# Patient Record
Sex: Female | Born: 1945 | Race: White | Hispanic: No | Smoking: Never smoker
Health system: Southern US, Community
[De-identification: ages and names within clinical notes are randomized; demographics above are authoritative.]

## PROBLEM LIST (undated history)

## (undated) DIAGNOSIS — Z8616 Personal history of COVID-19: Secondary | ICD-10-CM

## (undated) DIAGNOSIS — I471 Supraventricular tachycardia, unspecified: Secondary | ICD-10-CM

## (undated) DIAGNOSIS — R51 Headache: Secondary | ICD-10-CM

## (undated) DIAGNOSIS — I8390 Asymptomatic varicose veins of unspecified lower extremity: Secondary | ICD-10-CM

## (undated) DIAGNOSIS — M797 Fibromyalgia: Secondary | ICD-10-CM

## (undated) DIAGNOSIS — E039 Hypothyroidism, unspecified: Secondary | ICD-10-CM

## (undated) DIAGNOSIS — F331 Major depressive disorder, recurrent, moderate: Secondary | ICD-10-CM

## (undated) DIAGNOSIS — J189 Pneumonia, unspecified organism: Secondary | ICD-10-CM

## (undated) DIAGNOSIS — M25561 Pain in right knee: Secondary | ICD-10-CM

## (undated) DIAGNOSIS — E538 Deficiency of other specified B group vitamins: Secondary | ICD-10-CM

## (undated) DIAGNOSIS — N390 Urinary tract infection, site not specified: Secondary | ICD-10-CM

## (undated) DIAGNOSIS — I499 Cardiac arrhythmia, unspecified: Secondary | ICD-10-CM

## (undated) DIAGNOSIS — M1990 Unspecified osteoarthritis, unspecified site: Secondary | ICD-10-CM

## (undated) DIAGNOSIS — J479 Bronchiectasis, uncomplicated: Secondary | ICD-10-CM

## (undated) DIAGNOSIS — F1021 Alcohol dependence, in remission: Secondary | ICD-10-CM

## (undated) DIAGNOSIS — R011 Cardiac murmur, unspecified: Secondary | ICD-10-CM

## (undated) DIAGNOSIS — D649 Anemia, unspecified: Secondary | ICD-10-CM

## (undated) DIAGNOSIS — I10 Essential (primary) hypertension: Secondary | ICD-10-CM

## (undated) DIAGNOSIS — M25562 Pain in left knee: Secondary | ICD-10-CM

## (undated) DIAGNOSIS — I2510 Atherosclerotic heart disease of native coronary artery without angina pectoris: Secondary | ICD-10-CM

## (undated) DIAGNOSIS — R519 Headache, unspecified: Secondary | ICD-10-CM

## (undated) DIAGNOSIS — Z87440 Personal history of urinary (tract) infections: Secondary | ICD-10-CM

## (undated) DIAGNOSIS — F5101 Primary insomnia: Secondary | ICD-10-CM

## (undated) DIAGNOSIS — E785 Hyperlipidemia, unspecified: Secondary | ICD-10-CM

## (undated) DIAGNOSIS — F419 Anxiety disorder, unspecified: Secondary | ICD-10-CM

## (undated) DIAGNOSIS — Z9989 Dependence on other enabling machines and devices: Secondary | ICD-10-CM

## (undated) DIAGNOSIS — K5904 Chronic idiopathic constipation: Secondary | ICD-10-CM

## (undated) HISTORY — DX: Urinary tract infection, site not specified: N39.0

## (undated) HISTORY — DX: Pain in right knee: M25.561

## (undated) HISTORY — DX: Hyperlipidemia, unspecified: E78.5

## (undated) HISTORY — DX: Pneumonia, unspecified organism: J18.9

## (undated) HISTORY — DX: Hypothyroidism, unspecified: E03.9

## (undated) HISTORY — DX: Cardiac arrhythmia, unspecified: I49.9

## (undated) HISTORY — DX: Chronic idiopathic constipation: K59.04

## (undated) HISTORY — DX: Pain in right knee: M25.562

## (undated) HISTORY — DX: Alcohol dependence, in remission: F10.21

## (undated) HISTORY — DX: Essential (primary) hypertension: I10

## (undated) HISTORY — PX: BREAST BIOPSY: SHX20

## (undated) HISTORY — DX: Unspecified osteoarthritis, unspecified site: M19.90

## (undated) HISTORY — DX: Personal history of COVID-19: Z86.16

## (undated) HISTORY — PX: AUGMENTATION MAMMAPLASTY: SUR837

## (undated) HISTORY — DX: Anemia, unspecified: D64.9

## (undated) HISTORY — PX: BARIATRIC SURGERY: SHX1103

## (undated) HISTORY — PX: EYE SURGERY: SHX253

## (undated) HISTORY — PX: KNEE ARTHROSCOPY: SUR90

## (undated) HISTORY — DX: Supraventricular tachycardia: I47.1

## (undated) HISTORY — DX: Primary insomnia: F51.01

## (undated) HISTORY — DX: Asymptomatic varicose veins of unspecified lower extremity: I83.90

## (undated) HISTORY — PX: ANGIOPLASTY: SHX39

## (undated) HISTORY — DX: Bronchiectasis, uncomplicated: J47.9

## (undated) HISTORY — DX: Major depressive disorder, recurrent, moderate: F33.1

## (undated) HISTORY — PX: BACK SURGERY: SHX140

## (undated) HISTORY — DX: Deficiency of other specified B group vitamins: E53.8

## (undated) HISTORY — DX: Supraventricular tachycardia, unspecified: I47.10

## (undated) HISTORY — PX: APPENDECTOMY: SHX54

---

## 1968-06-20 HISTORY — PX: TUBAL LIGATION: SHX77

## 1979-06-21 HISTORY — PX: OTHER SURGICAL HISTORY: SHX169

## 1989-06-20 HISTORY — PX: ABDOMINAL HYSTERECTOMY: SHX81

## 1997-11-06 ENCOUNTER — Ambulatory Visit (HOSPITAL_COMMUNITY): Admission: RE | Admit: 1997-11-06 | Discharge: 1997-11-06 | Payer: Self-pay | Admitting: *Deleted

## 1997-11-11 ENCOUNTER — Ambulatory Visit (HOSPITAL_COMMUNITY): Admission: RE | Admit: 1997-11-11 | Discharge: 1997-11-11 | Payer: Self-pay | Admitting: *Deleted

## 1999-07-26 ENCOUNTER — Other Ambulatory Visit: Admission: RE | Admit: 1999-07-26 | Discharge: 1999-07-26 | Payer: Self-pay | Admitting: Gynecology

## 1999-08-17 ENCOUNTER — Ambulatory Visit (HOSPITAL_COMMUNITY): Admission: RE | Admit: 1999-08-17 | Discharge: 1999-08-18 | Payer: Self-pay | Admitting: Cardiology

## 2003-05-21 ENCOUNTER — Ambulatory Visit (HOSPITAL_COMMUNITY): Admission: RE | Admit: 2003-05-21 | Discharge: 2003-05-21 | Payer: Self-pay | Admitting: *Deleted

## 2004-02-04 ENCOUNTER — Ambulatory Visit (HOSPITAL_COMMUNITY): Admission: RE | Admit: 2004-02-04 | Discharge: 2004-02-04 | Payer: Self-pay | Admitting: *Deleted

## 2004-02-04 ENCOUNTER — Encounter: Admission: RE | Admit: 2004-02-04 | Discharge: 2004-02-04 | Payer: Self-pay | Admitting: *Deleted

## 2004-02-26 ENCOUNTER — Encounter: Admission: RE | Admit: 2004-02-26 | Discharge: 2004-05-26 | Payer: Self-pay | Admitting: *Deleted

## 2004-03-22 ENCOUNTER — Observation Stay (HOSPITAL_COMMUNITY): Admission: RE | Admit: 2004-03-22 | Discharge: 2004-03-23 | Payer: Self-pay | Admitting: *Deleted

## 2004-06-20 HISTORY — PX: BREAST ENHANCEMENT SURGERY: SHX7

## 2007-05-03 ENCOUNTER — Encounter: Admission: RE | Admit: 2007-05-03 | Discharge: 2007-05-03 | Payer: Self-pay | Admitting: Gynecology

## 2007-10-18 ENCOUNTER — Ambulatory Visit (HOSPITAL_COMMUNITY): Admission: RE | Admit: 2007-10-18 | Discharge: 2007-10-19 | Payer: Self-pay | Admitting: Neurosurgery

## 2007-10-19 HISTORY — PX: OTHER SURGICAL HISTORY: SHX169

## 2008-05-28 ENCOUNTER — Encounter: Admission: RE | Admit: 2008-05-28 | Discharge: 2008-05-28 | Payer: Self-pay | Admitting: Unknown Physician Specialty

## 2009-09-20 ENCOUNTER — Ambulatory Visit: Payer: Self-pay | Admitting: Cardiology

## 2009-09-20 ENCOUNTER — Inpatient Hospital Stay (HOSPITAL_COMMUNITY): Admission: EM | Admit: 2009-09-20 | Discharge: 2009-09-22 | Payer: Self-pay | Admitting: Emergency Medicine

## 2010-04-20 ENCOUNTER — Encounter: Admission: RE | Admit: 2010-04-20 | Discharge: 2010-04-20 | Payer: Self-pay | Admitting: Obstetrics and Gynecology

## 2010-09-08 LAB — CARDIAC PANEL(CRET KIN+CKTOT+MB+TROPI)
CK, MB: 0.6 ng/mL (ref 0.3–4.0)
Relative Index: INVALID (ref 0.0–2.5)

## 2010-09-08 LAB — LIPID PANEL
Cholesterol: 213 mg/dL — ABNORMAL HIGH (ref 0–200)
HDL: 51 mg/dL (ref >39)
LDL Cholesterol: 146 mg/dL — ABNORMAL HIGH (ref 0–99)
Total CHOL/HDL Ratio: 4.2 RATIO
Triglycerides: 81 mg/dL (ref <150)
VLDL: 16 mg/dL (ref 0–40)

## 2010-09-08 LAB — COMPREHENSIVE METABOLIC PANEL
ALT: 26 U/L (ref 0–35)
AST: 32 U/L (ref 0–37)
Albumin: 5 g/dL (ref 3.5–5.2)
Alkaline Phosphatase: 85 U/L (ref 39–117)
BUN: 19 mg/dL (ref 6–23)
CO2: 36 mEq/L — ABNORMAL HIGH (ref 19–32)
Calcium: 10.5 mg/dL (ref 8.4–10.5)
Chloride: 92 mEq/L — ABNORMAL LOW (ref 96–112)
Creatinine, Ser: 1.11 mg/dL (ref 0.4–1.2)
GFR calc Af Amer: >60 mL/min (ref >60)
GFR calc non Af Amer: 50 mL/min — ABNORMAL LOW (ref >60)
Glucose, Bld: 128 mg/dL — ABNORMAL HIGH (ref 70–99)
Potassium: 2.5 mEq/L — CL (ref 3.5–5.1)
Sodium: 142 mEq/L (ref 135–145)
Total Bilirubin: 1 mg/dL (ref 0.3–1.2)
Total Protein: 8.8 g/dL — ABNORMAL HIGH (ref 6.0–8.3)

## 2010-09-08 LAB — BASIC METABOLIC PANEL
CO2: 31 mEq/L (ref 19–32)
CO2: 34 mEq/L — ABNORMAL HIGH (ref 19–32)
Calcium: 8.1 mg/dL — ABNORMAL LOW (ref 8.4–10.5)
Calcium: 8.3 mg/dL — ABNORMAL LOW (ref 8.4–10.5)
Calcium: 8.6 mg/dL (ref 8.4–10.5)
Chloride: 102 mEq/L (ref 96–112)
GFR calc Af Amer: >60 mL/min (ref >60)
GFR calc Af Amer: >60 mL/min (ref >60)
GFR calc Af Amer: >60 mL/min (ref >60)
GFR calc non Af Amer: >60 mL/min (ref >60)
GFR calc non Af Amer: >60 mL/min (ref >60)
Glucose, Bld: 119 mg/dL — ABNORMAL HIGH (ref 70–99)
Potassium: 3.6 mEq/L (ref 3.5–5.1)
Sodium: 139 mEq/L (ref 135–145)
Sodium: 142 mEq/L (ref 135–145)
Sodium: 142 mEq/L (ref 135–145)

## 2010-09-08 LAB — DIFFERENTIAL
Basophils Absolute: 0 10*3/uL (ref 0.0–0.1)
Basophils Relative: 0 % (ref 0–1)
Basophils Relative: 1 % (ref 0–1)
Eosinophils Absolute: 0 10*3/uL (ref 0.0–0.7)
Eosinophils Relative: 1 % (ref 0–5)
Eosinophils Relative: 1 % (ref 0–5)
Lymphocytes Relative: 28 % (ref 12–46)
Lymphocytes Relative: 33 % (ref 12–46)
Lymphs Abs: 1.8 10*3/uL (ref 0.7–4.0)
Lymphs Abs: 2 10*3/uL (ref 0.7–4.0)
Lymphs Abs: 2.3 10*3/uL (ref 0.7–4.0)
Monocytes Absolute: 0.4 10*3/uL (ref 0.1–1.0)
Monocytes Absolute: 0.6 10*3/uL (ref 0.1–1.0)
Monocytes Absolute: 0.7 10*3/uL (ref 0.1–1.0)
Monocytes Relative: 10 % (ref 3–12)
Monocytes Relative: 6 % (ref 3–12)
Monocytes Relative: 9 % (ref 3–12)
Neutro Abs: 2.7 10*3/uL (ref 1.7–7.7)
Neutro Abs: 4.1 10*3/uL (ref 1.7–7.7)
Neutro Abs: 4.2 10*3/uL (ref 1.7–7.7)
Neutrophils Relative %: 57 % (ref 43–77)

## 2010-09-08 LAB — CK TOTAL AND CKMB (NOT AT ARMC)
CK, MB: 0.6 ng/mL (ref 0.3–4.0)
Relative Index: INVALID (ref 0.0–2.5)
Total CK: 61 U/L (ref 7–177)

## 2010-09-08 LAB — CBC
HCT: 33.8 % — ABNORMAL LOW (ref 36.0–46.0)
HCT: 45.9 % (ref 36.0–46.0)
Hemoglobin: 11.4 g/dL — ABNORMAL LOW (ref 12.0–15.0)
Hemoglobin: 11.5 g/dL — ABNORMAL LOW (ref 12.0–15.0)
Hemoglobin: 15.4 g/dL — ABNORMAL HIGH (ref 12.0–15.0)
MCHC: 33.6 g/dL (ref 30.0–36.0)
MCHC: 33.8 g/dL (ref 30.0–36.0)
MCV: 92.9 fL (ref 78.0–100.0)
MCV: 93.1 fL (ref 78.0–100.0)
Platelets: 247 10*3/uL (ref 150–400)
RBC: 3.63 MIL/uL — ABNORMAL LOW (ref 3.87–5.11)
RBC: 3.67 MIL/uL — ABNORMAL LOW (ref 3.87–5.11)
RBC: 4.94 MIL/uL (ref 3.87–5.11)
RDW: 13.5 % (ref 11.5–15.5)
WBC: 6.4 10*3/uL (ref 4.0–10.5)
WBC: 7.1 10*3/uL (ref 4.0–10.5)

## 2010-09-08 LAB — POCT CARDIAC MARKERS
CKMB, poc: <1 ng/mL — ABNORMAL LOW (ref 1.0–8.0)
CKMB, poc: <1 ng/mL — ABNORMAL LOW (ref 1.0–8.0)
Myoglobin, poc: 75.9 ng/mL (ref 12–200)
Myoglobin, poc: 83.7 ng/mL (ref 12–200)
Troponin i, poc: <0.05 ng/mL (ref 0.00–0.09)
Troponin i, poc: <0.05 ng/mL (ref 0.00–0.09)

## 2010-09-08 LAB — TROPONIN I: Troponin I: 0.03 ng/mL (ref 0.00–0.06)

## 2010-09-08 LAB — TSH: TSH: 6.767 u[IU]/mL — ABNORMAL HIGH (ref 0.350–4.500)

## 2010-09-08 LAB — LIPASE, BLOOD: Lipase: 35 U/L (ref 11–59)

## 2010-09-08 LAB — MAGNESIUM: Magnesium: 2.1 mg/dL (ref 1.5–2.5)

## 2010-11-02 NOTE — Op Note (Signed)
NAMECAITILIN, Courtney [name] NO.:  [id number]   MEDICAL RECORD NO.:  [id number]          PATIENT TYPE:  OIB   LOCATION:  [id number]                         FACILITY:  [facility]   PHYSICIAN:  Ophelia Charter, M.D.DATE OF BIRTH:  15-Oct-1945   DATE OF PROCEDURE:  10/18/2007  DATE OF DISCHARGE:  10/19/2007                               OPERATIVE REPORT   BRIEF HISTORY:  The patient is a 65 year old white female who suffered  from back and right leg pain consistent with a right L4-L5  radiculopathy.  She failed medical management and was worked up with a  lumbar MRI which demonstrated herniated disk L4-5 on the right.  I  discussed the various treatment options with the patient including  surgery.  She has aware of the risks, benefits, and alternatives of  surgery, so I proceed with a right L4 hemilaminectomy to decompress the  right L4-L5 nerve roots.   PREOPERATIVE DIAGNOSES:  1. 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  2. Spinal stenosis at L4-5.  3. Spondylolisthesis.  4. Lumbar radiculopathy/myelopathy.  5. Lumbago.   POSTOPERATIVE DIAGNOSES:  1. 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  2. Spinal stenosis at L4-5.  3. Spondylolisthesis.  4. Lumbar radiculopathy/myelopathy.  5. Lumbago.   PROCEDURE:  Right L4 hemilaminectomy for right L4-5 diskectomy using  microdissection to decompress the right L4 and L5 nerve roots.   SURGEON:  Ophelia Charter, MD   ASSISTANT:  Otilio Connors, MD   ANESTHESIA:  General endotracheal.   ESTIMATED BLOOD LOSS:  50 mL.   SPECIMENS:  None.   DRAINS:  None.   COMPLICATIONS:  None.   DESCRIPTION OF PROCEDURE:  The patient was brought to the operating room  by the anesthesia team.  General endotracheal anesthesia was induced.  The patient was turned to the prone position on the Wilson frame.  Her  lumbosacral region was then prepared with Betadine scrub and Betadine  solution.  Sterile drapes were applied and then injected the area to be  incised with Marcaine with epinephrine solution.  We used a scalpel to  make a linear midline incision over the L4-5 interspace.  I selected  electrocautery to perform a right-sided periosteal dissection exposing  the right spinous process and lamina of L4 and L5.  I obtained  intraoperatively radiograph to confirm our location.   We then inserted a McCullough retractor for exposure and then brought  the operative microscope into the field, and under its magnification and  illumination, we completed the microdissection/decompression.  We used a  high-speed drill to perform a right L4 laminotomy.  I completed the  right L4 hemilaminectomy using sponge and then removed the right L3-4  and 4-5 ligament flavum using Kerrison punch.  We also used high-speed  drill to perform a limited laminotomy at the L3 on the right.  We then  performed foraminotomy about the right L4 and L5 nerve roots.  We then  used microdissection to free up the thecal sac and nerve root from  epidural tissue.  Dr. Arnoldo Morale then gently retracted the thecal sac  medially.  This gave [place]  exposure of both the right L4 and L5 nerve  roots.  We inspected the right L5 nerve root.  There was some minimal  foraminal stenosis because of spinal anesthesia, but there did not  appear to be significant neural depression at the neural foramen.  We  then inspected the right L4 nerve root.  There was a small free fragment  disk herniation which was compressing the ventral aspect of small to  moderate free fragments disk herniation which was compressing the  exiting L4 nerve root at its entrance to neural foramen.  We used  microdissection to free up this disk herniation and removed the multiple  fragments using the pituitary forceps.  We then palpated along the  ventral surface of the exiting L4 nerve root and freed up some more  small fragments of disk herniation from the neural foramen.  We removed  these using the micro pituitary  forceps.  We then inspected the L4-5  intervertebral disk.  There was no large holes in the annulus nor did  there appear to be any impending disk herniations.  We obtained  hemostasis using bipolar cautery.  We palpated along the ventral surface  of the thecal sac along the exit route of the right L4 and L5 nerve  roots and noted the neural structures were well decompressed.  We then  removed the retractor and then reapproximated the patient's  thoracolumbar fascia with interrupted #1 Vicryl suture.  The  subcutaneous tissue was closed and 2-0 Vicryl suture and the skin with  Steri-Strips and Benzoin.  The wound was then coated with bacitracin  ointment and sterile dressing was applied.  The drapes were removed and  the patient was subsequently returned to supine position and extubated  by the anesthesia team.  He was transported to the [facility] in stable condition.  All sponge, instrument, and needle counts  were correct at the end of this case.      Ophelia Charter, M.D.  Electronically Signed     JDJ/MEDQ  D:  10/18/2007  T:  10/19/2007  Job:  [id number]

## 2010-11-05 NOTE — Op Note (Signed)
[name], [name] NO.:  [id number]   MEDICAL RECORD NO.:  [id number]          PATIENT TYPE:  OBV   LOCATION:  0474                         FACILITY:  [facility]   PHYSICIAN:  Darrelyn Hillock, M.D.DATE OF BIRTH:  05-16-1946   DATE OF PROCEDURE:  03/22/2004  DATE OF DISCHARGE:                                 OPERATIVE REPORT   PREOPERATIVE DIAGNOSIS:  Morbid obesity.   POSTOPERATIVE DIAGNOSIS:  Morbid obesity.   PROCEDURE:  Laparoscopic adjustable gastric banding with a 10 cm INAMED lap  band.   SURGEON:  Janeece Agee, M.D.   ASSISTANT:  Adonis Housekeeper, M.D.   ANESTHESIA:  General.   DESCRIPTION OF PROCEDURE:  The patient was taken to the operating room and  placed in a supine position.  After adequate anesthesia was induced using  endotracheal tube, the abdomen was prepped and draped in a normal sterile  fashion.  Using the OptiView technique, an 11 mm trocar was placed in the  left upper quadrant under direct visualization.  Two additional elevens were  placed in the right upper quadrant, and a 12 mm was placed in the right  infraumbilical paramedian position.  An additional 5 mm trocar was placed in  the left anterior axillary line.  Pneumoperitoneum had been obtained, and  the liver was retracted with a Nathanson retractor placed under the xiphoid.  A blunt dissection was done at the angle of His to create the adequate  space.  The dam passer was then passed posterior to the esophagus and  brought out in the area of previous dissection.  The band had previously  been primed and was placed through the 12 mm port and into the abdomen.  It  was passed behind the stomach and closed over an esophageal tube.  It moved  freely, and three separate interrupted Ethibond sutures were placed,  approximating the fundus of the stomach to the proximal pouch.  I had  adequate coverage of the band far away from the buckle.  The tubing was then  brought out  through the 12 mm port site and cut and attached to the port.  The incision was then extended, and the port was secured to the anterior  abdominal fascia with interrupted 2-0 Prolene sutures.  The incision was  closed with a subcutaneous 3-0 Vicryl.  All skin incisions were closed after  trocars were removed with staples and injected with 0.5% Marcaine.  The  patient tolerated the procedure well and went to PACU in good condition.      KRH/MEDQ  D:  03/22/2004  T:  03/22/2004  Job:  [id number]

## 2010-11-05 NOTE — Cardiovascular Report (Signed)
[place]  Patient:    Courtney [name], Courtney [name]                       MRN: [id number] Proc. Date: 08/17/99 Adm. Date:  [id number] Attending:  Wadie Lessen CC:         Despina Hick, M.D.             Wende Neighbors, M.D.             Cardiac Catheterization Laboratory                        Cardiac Catheterization  INDICATIONS:  [name] is a pleasant is a pleasant 65 year old white female. She has a strong family history of coronary artery disease.  She also has hypercholesterolemia.  She has had some exertional chest discomfort.  At exercise treadmill testing she had no EKG changes or Cardiolite abnormalities, but she did have chest discomfort.  She was, therefore, referred for further evaluation by cardiac catheterization.  The risks and alternatives were discussed with the patient.  She was agreeable o proceed.  PROCEDURES PERFORMED:  Left heart catheterization, selective coronary angiography, selective left ventriculography, root aortography.  DESCRIPTION OF PROCEDURE:  The procedure was performed from the right femoral artery using 6-French catheters.  She tolerated the procedure without complications.  There was a slightly lower blood pressure measured in the left rm and we, therefore, did a root angiogram to identify the subclavian.  There were no complications.  RESULTS:  HEMODYNAMIC DATA:  The central aortic pressure was 126/67.  LV pressure 126/16.  There was no gradient on pullback across the aortic valve.  ANGIOGRAPHIC DATA:  VENTRICULOGRAPHY:  Ventriculography in the RAO projection revealed preserved global systolic function.  No segmental abnormalities or contraction were identified.  Ejection fraction was calculated at 66%.  There was not significant mitral regurgitation.  ROOT ANGIOGRAM:  The root angiogram revealed what appeared to be a widely patent brachiocephalic vessel.  There was no evidence of thoracic arch  dissection. The subclavian on the left appeared to have a fairly smooth takeoff, filling the vertebral and the subclavian artery.  CORONARY ANGIOGRAPHY: 1. The left main coronary artery was free of critical disease.  2. The left anterior descending artery coursed to the apex.  There was one major    diagonal branch.  No significant focal stenoses were noted.  3. The circumflex consisted of predominantly one major marginal branch that    bifurcated.  There was some modest plaquing at the ostium of the vessel.    When seen in multiple views, there was the suggestion of a lumen of just over    2 mm to 2.5 mm.  High-grade stenosis was not noted in any view.  CONCLUSIONS: 1. Preserved left ventricular function. 2. No high-grade coronary stenosis.  DISPOSITION:  I will discuss the case with Dr. Woody Seller and perhaps a 2-D echocardiogram and other studies would be warranted.  Of note, the patient did ave several episodes of supraventricular tachycardia. DD:  08/17/99 TD:  08/17/99 Job: [id number]

## 2010-11-13 ENCOUNTER — Emergency Department (HOSPITAL_COMMUNITY)
Admission: EM | Admit: 2010-11-13 | Discharge: 2010-11-14 | Disposition: A | Discharge status: Home / Self Care | Payer: 59 | Attending: Emergency Medicine | Admitting: Emergency Medicine

## 2010-11-13 DIAGNOSIS — F3289 Other specified depressive episodes: Secondary | ICD-10-CM | POA: Insufficient documentation

## 2010-11-13 DIAGNOSIS — N390 Urinary tract infection, site not specified: Secondary | ICD-10-CM | POA: Insufficient documentation

## 2010-11-13 DIAGNOSIS — F329 Major depressive disorder, single episode, unspecified: Secondary | ICD-10-CM | POA: Insufficient documentation

## 2010-11-13 DIAGNOSIS — I10 Essential (primary) hypertension: Secondary | ICD-10-CM | POA: Insufficient documentation

## 2010-11-13 DIAGNOSIS — F1010 Alcohol abuse, uncomplicated: Secondary | ICD-10-CM | POA: Insufficient documentation

## 2010-11-13 LAB — DIFFERENTIAL
Basophils Absolute: 0.1 10*3/uL (ref 0.0–0.1)
Basophils Relative: 1 % (ref 0–1)
Eosinophils Relative: 1 % (ref 0–5)
Lymphocytes Relative: 36 % (ref 12–46)
Monocytes Absolute: 0.4 10*3/uL (ref 0.1–1.0)
Neutro Abs: 3.4 10*3/uL (ref 1.7–7.7)

## 2010-11-13 LAB — URINALYSIS, ROUTINE W REFLEX MICROSCOPIC
Bilirubin Urine: NEGATIVE
Glucose, UA: NEGATIVE mg/dL
Ketones, ur: NEGATIVE mg/dL
Nitrite: NEGATIVE
Specific Gravity, Urine: 1.007 (ref 1.005–1.030)
pH: 6.5 (ref 5.0–8.0)

## 2010-11-13 LAB — CBC
HCT: 42.8 % (ref 36.0–46.0)
MCHC: 33.6 g/dL (ref 30.0–36.0)
RDW: 12.5 % (ref 11.5–15.5)
WBC: 6 10*3/uL (ref 4.0–10.5)

## 2010-11-13 LAB — RAPID URINE DRUG SCREEN, HOSP PERFORMED
Amphetamines: NOT DETECTED
Benzodiazepines: NOT DETECTED
Cocaine: NOT DETECTED
Opiates: NOT DETECTED
Tetrahydrocannabinol: NOT DETECTED

## 2010-11-13 LAB — COMPREHENSIVE METABOLIC PANEL
ALT: 29 U/L (ref 0–35)
AST: 32 U/L (ref 0–37)
Albumin: 4.3 g/dL (ref 3.5–5.2)
Alkaline Phosphatase: 102 U/L (ref 39–117)
Calcium: 8.9 mg/dL (ref 8.4–10.5)
GFR calc Af Amer: >60 mL/min (ref >60)
Potassium: 3.6 mEq/L (ref 3.5–5.1)
Sodium: 137 mEq/L (ref 135–145)
Total Protein: 7.4 g/dL (ref 6.0–8.3)

## 2010-11-13 LAB — URINE MICROSCOPIC-ADD ON

## 2010-11-13 LAB — ETHANOL: Alcohol, Ethyl (B): <11 mg/dL — ABNORMAL HIGH (ref 0–10)

## 2010-11-14 DIAGNOSIS — F1020 Alcohol dependence, uncomplicated: Secondary | ICD-10-CM

## 2010-11-15 LAB — URINE CULTURE: Culture  Setup Time: 201205262025

## 2010-11-20 NOTE — Consult Note (Signed)
[name], BOOKWALTER NO.:  [id number]  MEDICAL RECORD NO.:  [id number]           PATIENT TYPE:  E  LOCATION:  WLED                         FACILITY:  [facility]  PHYSICIAN:  Juanda Crumble, MDDATE OF BIRTH:  Jul 13, 1945  DATE OF CONSULTATION:  11/14/2010 DATE OF DISCHARGE:  11/14/2010                                CONSULTATION   HISTORY:  Ms. Courtney [name] is a 65 year old married Caucasian female who worked as a Marine scientist in the hospital, also has alcohol abuse versus dependence.  The patient was brought in by her 22 year old daughter who is concerned about her drinking for the last 2 years, and knocked herself out.  Her blood alcohol level in the hospital was increased at 214 mg/dL.  The patient received Ativan during the emergency services for shakes.  The patient also reported she has been depressed, and seeing Dr. Lin Landsman, who was practicing in [place], gave her medication for depression.  Her medication was Celexa 20 mg daily.  She also takes medication Lasix and metoprolol.  The patient reported that she was sober 12 years before she started drinking 2 years ago.  Reportedly, she started drinking after losing a family member.  The patient reported that she has been drinking binge 2 days heavy, 12 or 13 days.  She reported drinking a bottle of whiskey or vodka and up to 6 cans of beers in 24 hours.  The patient reported that she has a family history of alcohol dependence in her father and her 80 years old daughter.  Her 61 years old daughter was sober at this time, working in [place].  Her 34 years old daughter works in [employer] in an office setting.  The patient also has a stress of her husband being a away, working in [place] for the last month.  The patient has regrets about her drinking and wants to get help.  She is willing to [facility] the [facility] where she has been in contact with.  Her family is in support of sending her to the  fellowship hall.  The patient does not have any history of detox or rehab in the past.  The patient reportedly able to manage herself at home.  MEDICAL HISTORY:  Hypertension.  PAST PSYCHIATRIC HISTORY:  Not significant for the hospitalization but received outpatient psychiatric medication from primary care physician, Dr. Lin Landsman in [place]:  The patient appeared as per her stated age, well developed, nourished, groomed well.  She has fine mood with bright and full affect.  She has normal rate, rhythm, and volume of speech. Her thought process is linear and goal directed.  She has no alcoholics shakes or hallucinations.  It does not seem like she has any DTs.  She has no history of DTs, and she has fair insight and judgment but poor impulse control with drinking alcohol.  DIAGNOSES:  Axis I:  Alcohol dependence, recent binge, but no signs of alcohol withdrawal during this visit. Axis II:  Deferred. Axis III:  Hypertension. Axis IV:  Moderate psychosocial stressors husband being away, recent loss her family member.  Axis V:  45 to 50.  TREATMENT PLAN:  The patient was offered detox, the patient refuses. The patient is willing to get rehab from the fellowship hall whenever bed is available.  Family is willing to take her home and follow up with Rehab Services.  She will continue her medications and follow up with Dr. Lin Landsman at [place].     Juanda Crumble, MD     JRJ/MEDQ  D:  11/14/2010  T:  11/14/2010  Job:  [id number]  Electronically Signed by Ambrose Finland MD on 11/20/2010 08:04:18 AM

## 2011-03-03 ENCOUNTER — Encounter (INDEPENDENT_AMBULATORY_CARE_PROVIDER_SITE_OTHER): Payer: 59 | Admitting: Surgery

## 2011-03-15 LAB — BASIC METABOLIC PANEL
BUN: 15
Chloride: 100
GFR calc Af Amer: >60
GFR calc non Af Amer: >60
Potassium: 4.3
Sodium: 135

## 2011-03-15 LAB — CBC
HCT: 35.1 — ABNORMAL LOW
Platelets: 243
RBC: 3.85 — ABNORMAL LOW
WBC: 9.3

## 2011-03-31 ENCOUNTER — Encounter (INDEPENDENT_AMBULATORY_CARE_PROVIDER_SITE_OTHER): Payer: 59 | Admitting: Surgery

## 2013-07-08 DIAGNOSIS — J019 Acute sinusitis, unspecified: Secondary | ICD-10-CM | POA: Diagnosis not present

## 2013-07-08 DIAGNOSIS — R300 Dysuria: Secondary | ICD-10-CM | POA: Diagnosis not present

## 2013-07-26 DIAGNOSIS — M533 Sacrococcygeal disorders, not elsewhere classified: Secondary | ICD-10-CM | POA: Diagnosis not present

## 2013-07-26 DIAGNOSIS — M129 Arthropathy, unspecified: Secondary | ICD-10-CM | POA: Diagnosis not present

## 2013-07-26 DIAGNOSIS — M5137 Other intervertebral disc degeneration, lumbosacral region: Secondary | ICD-10-CM | POA: Diagnosis not present

## 2013-08-14 DIAGNOSIS — F411 Generalized anxiety disorder: Secondary | ICD-10-CM | POA: Diagnosis not present

## 2013-08-14 DIAGNOSIS — J019 Acute sinusitis, unspecified: Secondary | ICD-10-CM | POA: Diagnosis not present

## 2013-10-09 DIAGNOSIS — Z23 Encounter for immunization: Secondary | ICD-10-CM | POA: Diagnosis not present

## 2013-10-09 DIAGNOSIS — M545 Low back pain, unspecified: Secondary | ICD-10-CM | POA: Diagnosis not present

## 2013-10-09 DIAGNOSIS — M5380 Other specified dorsopathies, site unspecified: Secondary | ICD-10-CM | POA: Diagnosis not present

## 2013-10-23 DIAGNOSIS — M5137 Other intervertebral disc degeneration, lumbosacral region: Secondary | ICD-10-CM | POA: Diagnosis not present

## 2013-10-23 DIAGNOSIS — M533 Sacrococcygeal disorders, not elsewhere classified: Secondary | ICD-10-CM | POA: Diagnosis not present

## 2013-10-23 DIAGNOSIS — M129 Arthropathy, unspecified: Secondary | ICD-10-CM | POA: Diagnosis not present

## 2013-11-14 DIAGNOSIS — M533 Sacrococcygeal disorders, not elsewhere classified: Secondary | ICD-10-CM | POA: Diagnosis not present

## 2013-12-05 DIAGNOSIS — K5900 Constipation, unspecified: Secondary | ICD-10-CM | POA: Diagnosis not present

## 2013-12-05 DIAGNOSIS — I10 Essential (primary) hypertension: Secondary | ICD-10-CM | POA: Diagnosis not present

## 2013-12-05 DIAGNOSIS — G4700 Insomnia, unspecified: Secondary | ICD-10-CM | POA: Diagnosis not present

## 2013-12-13 DIAGNOSIS — I10 Essential (primary) hypertension: Secondary | ICD-10-CM | POA: Diagnosis not present

## 2013-12-13 DIAGNOSIS — G4700 Insomnia, unspecified: Secondary | ICD-10-CM | POA: Diagnosis not present

## 2013-12-13 DIAGNOSIS — F411 Generalized anxiety disorder: Secondary | ICD-10-CM | POA: Diagnosis not present

## 2013-12-23 DIAGNOSIS — E663 Overweight: Secondary | ICD-10-CM | POA: Diagnosis not present

## 2014-01-07 DIAGNOSIS — I10 Essential (primary) hypertension: Secondary | ICD-10-CM | POA: Diagnosis not present

## 2014-01-07 DIAGNOSIS — E785 Hyperlipidemia, unspecified: Secondary | ICD-10-CM | POA: Diagnosis not present

## 2014-01-07 DIAGNOSIS — R0609 Other forms of dyspnea: Secondary | ICD-10-CM | POA: Diagnosis not present

## 2014-01-07 DIAGNOSIS — R0789 Other chest pain: Secondary | ICD-10-CM | POA: Diagnosis not present

## 2014-01-07 DIAGNOSIS — R002 Palpitations: Secondary | ICD-10-CM | POA: Diagnosis not present

## 2014-01-07 DIAGNOSIS — Z8679 Personal history of other diseases of the circulatory system: Secondary | ICD-10-CM | POA: Diagnosis not present

## 2014-01-07 DIAGNOSIS — I491 Atrial premature depolarization: Secondary | ICD-10-CM | POA: Diagnosis not present

## 2014-01-08 DIAGNOSIS — N3000 Acute cystitis without hematuria: Secondary | ICD-10-CM | POA: Diagnosis not present

## 2014-01-15 DIAGNOSIS — M129 Arthropathy, unspecified: Secondary | ICD-10-CM | POA: Diagnosis not present

## 2014-01-15 DIAGNOSIS — M533 Sacrococcygeal disorders, not elsewhere classified: Secondary | ICD-10-CM | POA: Diagnosis not present

## 2014-01-15 DIAGNOSIS — M5137 Other intervertebral disc degeneration, lumbosacral region: Secondary | ICD-10-CM | POA: Diagnosis not present

## 2014-01-28 DIAGNOSIS — M549 Dorsalgia, unspecified: Secondary | ICD-10-CM | POA: Diagnosis not present

## 2014-01-28 DIAGNOSIS — Z6827 Body mass index (BMI) 27.0-27.9, adult: Secondary | ICD-10-CM | POA: Diagnosis not present

## 2014-02-05 DIAGNOSIS — R300 Dysuria: Secondary | ICD-10-CM | POA: Diagnosis not present

## 2014-02-05 DIAGNOSIS — R1011 Right upper quadrant pain: Secondary | ICD-10-CM | POA: Diagnosis not present

## 2014-02-05 DIAGNOSIS — N390 Urinary tract infection, site not specified: Secondary | ICD-10-CM | POA: Diagnosis not present

## 2014-02-10 DIAGNOSIS — E119 Type 2 diabetes mellitus without complications: Secondary | ICD-10-CM | POA: Diagnosis not present

## 2014-02-10 DIAGNOSIS — R300 Dysuria: Secondary | ICD-10-CM | POA: Diagnosis not present

## 2014-02-10 DIAGNOSIS — R81 Glycosuria: Secondary | ICD-10-CM | POA: Diagnosis not present

## 2014-02-10 DIAGNOSIS — J019 Acute sinusitis, unspecified: Secondary | ICD-10-CM | POA: Diagnosis not present

## 2014-03-25 DIAGNOSIS — Z23 Encounter for immunization: Secondary | ICD-10-CM | POA: Diagnosis not present

## 2014-04-25 DIAGNOSIS — M533 Sacrococcygeal disorders, not elsewhere classified: Secondary | ICD-10-CM | POA: Diagnosis not present

## 2014-04-25 DIAGNOSIS — M5137 Other intervertebral disc degeneration, lumbosacral region: Secondary | ICD-10-CM | POA: Diagnosis not present

## 2014-04-25 DIAGNOSIS — M129 Arthropathy, unspecified: Secondary | ICD-10-CM | POA: Diagnosis not present

## 2014-05-21 DIAGNOSIS — J209 Acute bronchitis, unspecified: Secondary | ICD-10-CM | POA: Diagnosis not present

## 2014-05-21 DIAGNOSIS — R011 Cardiac murmur, unspecified: Secondary | ICD-10-CM | POA: Diagnosis not present

## 2014-05-21 DIAGNOSIS — R0989 Other specified symptoms and signs involving the circulatory and respiratory systems: Secondary | ICD-10-CM | POA: Diagnosis not present

## 2014-05-21 DIAGNOSIS — R0602 Shortness of breath: Secondary | ICD-10-CM | POA: Diagnosis not present

## 2014-05-21 DIAGNOSIS — R918 Other nonspecific abnormal finding of lung field: Secondary | ICD-10-CM | POA: Diagnosis not present

## 2014-05-21 DIAGNOSIS — R05 Cough: Secondary | ICD-10-CM | POA: Diagnosis not present

## 2014-05-26 DIAGNOSIS — I10 Essential (primary) hypertension: Secondary | ICD-10-CM | POA: Diagnosis not present

## 2014-05-26 DIAGNOSIS — R7309 Other abnormal glucose: Secondary | ICD-10-CM | POA: Diagnosis not present

## 2014-05-26 DIAGNOSIS — R002 Palpitations: Secondary | ICD-10-CM | POA: Diagnosis not present

## 2014-05-26 DIAGNOSIS — J209 Acute bronchitis, unspecified: Secondary | ICD-10-CM | POA: Diagnosis not present

## 2014-06-24 DIAGNOSIS — R0789 Other chest pain: Secondary | ICD-10-CM | POA: Diagnosis not present

## 2014-06-24 DIAGNOSIS — R0602 Shortness of breath: Secondary | ICD-10-CM | POA: Diagnosis not present

## 2014-06-24 DIAGNOSIS — R079 Chest pain, unspecified: Secondary | ICD-10-CM | POA: Diagnosis not present

## 2014-06-24 DIAGNOSIS — R071 Chest pain on breathing: Secondary | ICD-10-CM | POA: Diagnosis not present

## 2014-06-24 DIAGNOSIS — I10 Essential (primary) hypertension: Secondary | ICD-10-CM | POA: Diagnosis not present

## 2014-06-24 DIAGNOSIS — R05 Cough: Secondary | ICD-10-CM | POA: Diagnosis not present

## 2014-06-24 DIAGNOSIS — K209 Esophagitis, unspecified: Secondary | ICD-10-CM | POA: Diagnosis not present

## 2014-06-24 DIAGNOSIS — R112 Nausea with vomiting, unspecified: Secondary | ICD-10-CM | POA: Diagnosis not present

## 2014-06-24 DIAGNOSIS — R042 Hemoptysis: Secondary | ICD-10-CM | POA: Diagnosis not present

## 2014-06-24 DIAGNOSIS — J189 Pneumonia, unspecified organism: Secondary | ICD-10-CM | POA: Diagnosis not present

## 2014-06-24 DIAGNOSIS — E8809 Other disorders of plasma-protein metabolism, not elsewhere classified: Secondary | ICD-10-CM | POA: Diagnosis not present

## 2014-06-24 DIAGNOSIS — R0781 Pleurodynia: Secondary | ICD-10-CM | POA: Diagnosis not present

## 2014-06-24 DIAGNOSIS — A419 Sepsis, unspecified organism: Secondary | ICD-10-CM | POA: Diagnosis not present

## 2014-06-24 DIAGNOSIS — E785 Hyperlipidemia, unspecified: Secondary | ICD-10-CM | POA: Diagnosis not present

## 2014-06-24 DIAGNOSIS — R0600 Dyspnea, unspecified: Secondary | ICD-10-CM | POA: Diagnosis not present

## 2014-06-24 DIAGNOSIS — J9601 Acute respiratory failure with hypoxia: Secondary | ICD-10-CM | POA: Diagnosis not present

## 2014-06-24 DIAGNOSIS — R6520 Severe sepsis without septic shock: Secondary | ICD-10-CM | POA: Diagnosis not present

## 2014-07-03 DIAGNOSIS — J159 Unspecified bacterial pneumonia: Secondary | ICD-10-CM | POA: Diagnosis not present

## 2014-07-03 DIAGNOSIS — K228 Other specified diseases of esophagus: Secondary | ICD-10-CM | POA: Diagnosis not present

## 2014-07-09 ENCOUNTER — Institutional Professional Consult (permissible substitution): Payer: Self-pay | Admitting: Internal Medicine

## 2014-07-16 ENCOUNTER — Institutional Professional Consult (permissible substitution): Payer: Self-pay | Admitting: Internal Medicine

## 2014-07-25 ENCOUNTER — Encounter: Payer: Self-pay | Admitting: Internal Medicine

## 2014-07-25 ENCOUNTER — Ambulatory Visit (INDEPENDENT_AMBULATORY_CARE_PROVIDER_SITE_OTHER)
Admission: RE | Admit: 2014-07-25 | Discharge: 2014-07-25 | Disposition: A | Discharge status: Home / Self Care | Payer: Medicare Other | Source: Ambulatory Visit | Attending: Internal Medicine | Admitting: Internal Medicine

## 2014-07-25 ENCOUNTER — Ambulatory Visit (INDEPENDENT_AMBULATORY_CARE_PROVIDER_SITE_OTHER): Payer: Medicare Other | Admitting: Internal Medicine

## 2014-07-25 VITALS — BP 114/76 | HR 78 | Temp 97.8°F | Ht 65.5 in | Wt 174.0 lb

## 2014-07-25 DIAGNOSIS — R0789 Other chest pain: Secondary | ICD-10-CM | POA: Diagnosis not present

## 2014-07-25 DIAGNOSIS — J189 Pneumonia, unspecified organism: Secondary | ICD-10-CM | POA: Diagnosis not present

## 2014-07-25 HISTORY — DX: Pneumonia, unspecified organism: J18.9

## 2014-07-25 NOTE — Progress Notes (Signed)
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 Hemoptysis > admitted [facility] with dx of pna/ d/c on prednisone > off by end Jan 2016 and followed by Dr Lin Landsman in [place] / referred by Dr Lysbeth Galas to pulmonary clinic 07/25/2014     07/25/2014 1st [facility] office visit/ Kayleah Appleyard   Chief Complaint  Patient presents with  . Advice Only    pt here for f/u pna. Pt c/o chest pain with deep breathing,,Pt has sl. sob and wheezing. Pt denies cough.   still some discomfort with deep breath, started back walking at mall x 15 min s stopping,slow to mod pace. Pain is in same place it was when admitted with dx of pna but much less severe, only about a 1-2 vs 8-10 on admit  Has saba but not using  No obvious day to day or daytime variabilty  or assoc productive  cough or   chest tightness,   overt sinus or hb symptoms. No unusual exp hx or h/o childhood pna/ asthma or knowledge of premature birth.  Sleeping ok without nocturnal  or early am exacerbation  of respiratory  c/o's or need for noct saba. Also denies any obvious fluctuation of symptoms with weather or environmental changes or other aggravating or alleviating factors except as outlined above   Current Medications, Allergies, Complete Past Medical History, Past Surgical History, Family History, and Social History were reviewed in [facility] record.  ROS  The following are not active complaints unless bolded sore throat, dysphagia, dental problems, itching, sneezing,  nasal congestion or excess/ purulent secretions, ear ache,   fever, chills, sweats, unintended wt loss, pleuritic or exertional cp, hemoptysis,  orthopnea pnd or leg swelling, presyncope, palpitations, heartburn, abdominal pain, anorexia, nausea, vomiting, diarrhea  or change in bowel or  urinary habits, change in stools or urine, dysuria,hematuria,  rash, arthralgias, visual complaints, headache, numbness weakness or ataxia or problems with walking or coordination,  change in mood/affect or memory.           Review of Systems     Objective:   Physical Exam  amb wf nad  Wt Readings from Last 3 Encounters:  07/25/14 174 lb (78.926 kg)    Vital signs reviewed   HEENT: nl dentition, turbinates, and orophanx. Nl external ear canals without cough reflex   NECK :  without JVD/Nodes/TM/ nl carotid upstrokes bilaterally   LUNGS: no acc muscle use, clear to A and P bilaterally without cough on insp or exp maneuvers   CV:  RRR  no s3 or murmur or increase in P2, no edema   ABD:  soft and nontender with nl excursion in the supine position. No bruits or organomegaly, bowel sounds nl  MS:  warm without deformities, calf tenderness, cyanosis or clubbing  SKIN: warm and dry without lesions    NEURO:  alert, approp, no deficits    CXR PA and Lateral:   07/25/2014 :     I personally reviewed images and agree with radiology impression as follows:    Significant improvement on the right with minimal residual inflammatory change or atelectasis. On the left, there has been improvement but there is persistent radiographic infiltrate      Assessment:

## 2014-07-25 NOTE — Progress Notes (Signed)
Quick Note:  Spoke with pt and notified of results per Dr. Wert. Pt verbalized understanding and denied any questions.  ______ 

## 2014-07-25 NOTE — Patient Instructions (Signed)
Please remember to go to the  x-ray department downstairs for your tests - we will call you with the results when they are available.     

## 2014-07-27 ENCOUNTER — Encounter: Payer: Self-pay | Admitting: Internal Medicine

## 2014-07-27 DIAGNOSIS — R0789 Other chest pain: Secondary | ICD-10-CM | POA: Insufficient documentation

## 2014-07-27 NOTE — Assessment & Plan Note (Addendum)
See CTa Chest 06/24/14 at [facility] No assoc pleural effusion and right sided residual changes on cxr are less imprressive than the Left so I suspect this is just mscp from coughing which has now resolved and needs no further w/u unless flares in absence of  cough.

## 2014-07-27 NOTE — Assessment & Plan Note (Addendum)
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

## 2014-07-29 DIAGNOSIS — I83813 Varicose veins of bilateral lower extremities with pain: Secondary | ICD-10-CM | POA: Diagnosis not present

## 2014-08-05 DIAGNOSIS — M129 Arthropathy, unspecified: Secondary | ICD-10-CM | POA: Diagnosis not present

## 2014-08-05 DIAGNOSIS — M5137 Other intervertebral disc degeneration, lumbosacral region: Secondary | ICD-10-CM | POA: Diagnosis not present

## 2014-08-05 DIAGNOSIS — M533 Sacrococcygeal disorders, not elsewhere classified: Secondary | ICD-10-CM | POA: Diagnosis not present

## 2014-08-05 DIAGNOSIS — Z681 Body mass index (BMI) 19 or less, adult: Secondary | ICD-10-CM | POA: Diagnosis not present

## 2014-08-07 DIAGNOSIS — I83813 Varicose veins of bilateral lower extremities with pain: Secondary | ICD-10-CM | POA: Diagnosis not present

## 2014-08-25 ENCOUNTER — Ambulatory Visit: Payer: Medicare Other | Admitting: Internal Medicine

## 2014-09-02 DIAGNOSIS — I10 Essential (primary) hypertension: Secondary | ICD-10-CM | POA: Diagnosis not present

## 2014-09-02 DIAGNOSIS — E785 Hyperlipidemia, unspecified: Secondary | ICD-10-CM | POA: Diagnosis not present

## 2014-10-17 DIAGNOSIS — M179 Osteoarthritis of knee, unspecified: Secondary | ICD-10-CM | POA: Diagnosis not present

## 2014-10-17 DIAGNOSIS — M25561 Pain in right knee: Secondary | ICD-10-CM | POA: Diagnosis not present

## 2014-10-17 DIAGNOSIS — M11261 Other chondrocalcinosis, right knee: Secondary | ICD-10-CM | POA: Diagnosis not present

## 2014-10-30 ENCOUNTER — Other Ambulatory Visit: Payer: Self-pay | Admitting: Orthopedic Surgery

## 2014-10-30 DIAGNOSIS — R609 Edema, unspecified: Secondary | ICD-10-CM

## 2014-10-30 DIAGNOSIS — R52 Pain, unspecified: Secondary | ICD-10-CM

## 2014-10-30 DIAGNOSIS — M1711 Unilateral primary osteoarthritis, right knee: Secondary | ICD-10-CM | POA: Diagnosis not present

## 2014-10-31 DIAGNOSIS — I83813 Varicose veins of bilateral lower extremities with pain: Secondary | ICD-10-CM | POA: Diagnosis not present

## 2014-11-01 ENCOUNTER — Ambulatory Visit
Admission: RE | Admit: 2014-11-01 | Discharge: 2014-11-01 | Disposition: A | Discharge status: Home / Self Care | Payer: Medicare Other | Source: Ambulatory Visit | Attending: Orthopedic Surgery | Admitting: Orthopedic Surgery

## 2014-11-01 DIAGNOSIS — S8391XA Sprain of unspecified site of right knee, initial encounter: Secondary | ICD-10-CM | POA: Diagnosis not present

## 2014-11-01 DIAGNOSIS — R52 Pain, unspecified: Secondary | ICD-10-CM

## 2014-11-01 DIAGNOSIS — M25561 Pain in right knee: Secondary | ICD-10-CM | POA: Diagnosis not present

## 2014-11-01 DIAGNOSIS — R609 Edema, unspecified: Secondary | ICD-10-CM

## 2014-11-03 DIAGNOSIS — M1711 Unilateral primary osteoarthritis, right knee: Secondary | ICD-10-CM | POA: Diagnosis not present

## 2014-11-03 DIAGNOSIS — M2341 Loose body in knee, right knee: Secondary | ICD-10-CM | POA: Diagnosis not present

## 2014-11-03 DIAGNOSIS — M5137 Other intervertebral disc degeneration, lumbosacral region: Secondary | ICD-10-CM | POA: Diagnosis not present

## 2014-11-03 DIAGNOSIS — M533 Sacrococcygeal disorders, not elsewhere classified: Secondary | ICD-10-CM | POA: Diagnosis not present

## 2014-11-03 DIAGNOSIS — Z5181 Encounter for therapeutic drug level monitoring: Secondary | ICD-10-CM | POA: Diagnosis not present

## 2014-11-03 DIAGNOSIS — M129 Arthropathy, unspecified: Secondary | ICD-10-CM | POA: Diagnosis not present

## 2014-11-05 DIAGNOSIS — M179 Osteoarthritis of knee, unspecified: Secondary | ICD-10-CM | POA: Diagnosis not present

## 2014-11-05 DIAGNOSIS — S83281A Other tear of lateral meniscus, current injury, right knee, initial encounter: Secondary | ICD-10-CM | POA: Diagnosis not present

## 2014-11-05 DIAGNOSIS — M11261 Other chondrocalcinosis, right knee: Secondary | ICD-10-CM | POA: Diagnosis not present

## 2014-11-05 DIAGNOSIS — Y929 Unspecified place or not applicable: Secondary | ICD-10-CM | POA: Diagnosis not present

## 2014-11-05 DIAGNOSIS — M23221 Derangement of posterior horn of medial meniscus due to old tear or injury, right knee: Secondary | ICD-10-CM | POA: Diagnosis not present

## 2014-11-05 DIAGNOSIS — M94261 Chondromalacia, right knee: Secondary | ICD-10-CM | POA: Diagnosis not present

## 2014-11-05 DIAGNOSIS — X58XXXA Exposure to other specified factors, initial encounter: Secondary | ICD-10-CM | POA: Diagnosis not present

## 2014-11-05 DIAGNOSIS — S83241A Other tear of medial meniscus, current injury, right knee, initial encounter: Secondary | ICD-10-CM | POA: Diagnosis not present

## 2014-11-12 DIAGNOSIS — M25561 Pain in right knee: Secondary | ICD-10-CM | POA: Diagnosis not present

## 2014-11-13 DIAGNOSIS — Z9889 Other specified postprocedural states: Secondary | ICD-10-CM | POA: Diagnosis not present

## 2014-11-13 DIAGNOSIS — M79604 Pain in right leg: Secondary | ICD-10-CM | POA: Diagnosis not present

## 2014-11-19 DIAGNOSIS — M25561 Pain in right knee: Secondary | ICD-10-CM | POA: Diagnosis not present

## 2014-11-20 DIAGNOSIS — M25561 Pain in right knee: Secondary | ICD-10-CM | POA: Diagnosis not present

## 2014-11-22 DIAGNOSIS — N3000 Acute cystitis without hematuria: Secondary | ICD-10-CM | POA: Diagnosis not present

## 2014-12-01 DIAGNOSIS — M160 Bilateral primary osteoarthritis of hip: Secondary | ICD-10-CM | POA: Diagnosis not present

## 2014-12-01 DIAGNOSIS — M5441 Lumbago with sciatica, right side: Secondary | ICD-10-CM | POA: Diagnosis not present

## 2014-12-09 DIAGNOSIS — M5136 Other intervertebral disc degeneration, lumbar region: Secondary | ICD-10-CM | POA: Diagnosis not present

## 2014-12-09 DIAGNOSIS — M5441 Lumbago with sciatica, right side: Secondary | ICD-10-CM | POA: Diagnosis not present

## 2014-12-09 DIAGNOSIS — M47816 Spondylosis without myelopathy or radiculopathy, lumbar region: Secondary | ICD-10-CM | POA: Diagnosis not present

## 2014-12-09 DIAGNOSIS — M5416 Radiculopathy, lumbar region: Secondary | ICD-10-CM | POA: Diagnosis not present

## 2014-12-09 DIAGNOSIS — M4806 Spinal stenosis, lumbar region: Secondary | ICD-10-CM | POA: Diagnosis not present

## 2015-01-13 DIAGNOSIS — E780 Pure hypercholesterolemia: Secondary | ICD-10-CM | POA: Diagnosis not present

## 2015-01-13 DIAGNOSIS — D509 Iron deficiency anemia, unspecified: Secondary | ICD-10-CM | POA: Diagnosis not present

## 2015-01-13 DIAGNOSIS — I10 Essential (primary) hypertension: Secondary | ICD-10-CM | POA: Diagnosis not present

## 2015-01-13 DIAGNOSIS — I8393 Asymptomatic varicose veins of bilateral lower extremities: Secondary | ICD-10-CM | POA: Diagnosis not present

## 2015-01-13 DIAGNOSIS — R5383 Other fatigue: Secondary | ICD-10-CM | POA: Diagnosis not present

## 2015-01-13 DIAGNOSIS — Z79899 Other long term (current) drug therapy: Secondary | ICD-10-CM | POA: Diagnosis not present

## 2015-02-02 DIAGNOSIS — M5137 Other intervertebral disc degeneration, lumbosacral region: Secondary | ICD-10-CM | POA: Diagnosis not present

## 2015-02-02 DIAGNOSIS — R300 Dysuria: Secondary | ICD-10-CM | POA: Diagnosis not present

## 2015-02-02 DIAGNOSIS — Z6826 Body mass index (BMI) 26.0-26.9, adult: Secondary | ICD-10-CM | POA: Diagnosis not present

## 2015-02-02 DIAGNOSIS — J029 Acute pharyngitis, unspecified: Secondary | ICD-10-CM | POA: Diagnosis not present

## 2015-02-02 DIAGNOSIS — N3090 Cystitis, unspecified without hematuria: Secondary | ICD-10-CM | POA: Diagnosis not present

## 2015-02-02 DIAGNOSIS — M4806 Spinal stenosis, lumbar region: Secondary | ICD-10-CM | POA: Diagnosis not present

## 2015-02-02 DIAGNOSIS — M129 Arthropathy, unspecified: Secondary | ICD-10-CM | POA: Diagnosis not present

## 2015-02-09 ENCOUNTER — Encounter: Payer: Self-pay | Admitting: Vascular Surgery

## 2015-02-10 ENCOUNTER — Encounter: Payer: Self-pay | Admitting: Vascular Surgery

## 2015-02-10 ENCOUNTER — Ambulatory Visit (INDEPENDENT_AMBULATORY_CARE_PROVIDER_SITE_OTHER): Payer: Medicare Other | Admitting: Vascular Surgery

## 2015-02-10 VITALS — BP 111/66 | HR 72 | Temp 97.9°F | Resp 18 | Ht 64.0 in | Wt 172.7 lb

## 2015-02-10 DIAGNOSIS — I83813 Varicose veins of bilateral lower extremities with pain: Secondary | ICD-10-CM | POA: Diagnosis not present

## 2015-02-10 NOTE — Progress Notes (Signed)
Patient name: Courtney [name] MRN: [id number] DOB: 10/23/1945 Sex: female   Referred by: Lin Landsman  Reason for referral:  Chief Complaint  Patient presents with  . New Evaluation    bilateral leg pain and swelling R>L,  have been wearing thigh high compression hose 20-30 mm HG > 3 months     . Varicose Veins    HISTORY OF PRESENT ILLNESS: The patient resents today for discussion of venous hypertension. She is a very pleasant active healthy 69 year old female who reports several issues regarding her lower extremities. 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 There is no pain specifically related to these. She also has diffuse aching of both lower extremities. This may be slightly more on her right leg to her left. She reports this occurs in her calves and her thighs. It is chronic and is slightly worse with prolonged standing. There is no association with actually walking and pain. She has no history of arterial insufficiency. Has no history of DVT. She has no changes of venous hypertension, specifically no hemosiderin deposits at the level of her ankle and no swelling. She did have a trial of graduated compression and notes no improvement with the use of these. She was seen by a vein Center in [place] and I do have the documentation from these and reviewed these. Also underwent formal venous duplex and I have this as well. She does have a history of degenerative disc disease and is to see a spine surgeon within the next 1-2 weeks for follow-up as well  Past Medical History  Diagnosis Date  . Varicose veins   . Hyperlipidemia   . Arthritis   . Anemia   . Hypertension   . Recovering alcoholic     Past Surgical History  Procedure Laterality Date  . Appendectomy    . Abdominal hysterectomy  1991  . Removal of cervical disc fragments  10-2007  . Lap band surgery  1981  . Breast enhancement surgery   2006    Social History   Social History  . Marital Status: Married    Spouse Name: N/A  . Number of Children: N/A  . Years of Education: N/A   Occupational History  . Not on file.   Social History Main Topics  . Smoking status: Never Smoker   . Smokeless tobacco: Never Used  . Alcohol Use: No  . Drug Use: No  . Sexual Activity: Not on file   Other Topics Concern  . Not on file   Social History Narrative    Family History  Problem Relation Age of Onset  . Heart disease Mother   . Heart disease Father   . Heart disease Brother     Allergies as of 02/10/2015  . (No Known Allergies)    Current Outpatient Prescriptions on File Prior to Visit  Medication Sig Dispense Refill  . HYDROcodone-acetaminophen (NORCO) 7.5-325 MG per tablet Take 1 tablet by mouth daily as needed.    . metoprolol tartrate (LOPRESSOR) 25 MG tablet Take 25 mg by mouth 2 (two) times daily.    . Multiple Vitamins-Minerals (CENTRUM SILVER ADULT 50+ PO) Take 1 tablet by mouth daily.    . pravastatin (PRAVACHOL) 20 MG tablet Take 20 mg by mouth daily.    [name] PROAIR HFA 108 (90 BASE) MCG/ACT inhaler Inhale 2 puffs into the lungs as needed.    . vitamin B-12 (CYANOCOBALAMIN)  1000 MCG tablet Place 1,000 mcg under the tongue daily.     No current facility-administered medications on file prior to visit.     REVIEW OF SYSTEMS:  Positives indicated with an "X"  CARDIOVASCULAR:  [ ]  chest pain   [ ]  chest pressure   [ ]  palpitations   [ ]  orthopnea   [ ]  dyspnea on exertion   [ ]  claudication   [ ]  rest pain   [ ]  DVT   [ ]  phlebitis PULMONARY:   [ ]  productive cough   [ ]  asthma   [ ]  wheezing NEUROLOGIC:   [x weakness  [ x] paresthesias  [ ]  aphasia  [ ]  amaurosis  [ ]  dizziness HEMATOLOGIC:   [ ]  bleeding problems   [ ]  clotting disorders MUSCULOSKELETAL:  [ ]  joint pain   [ ]  joint swelling GASTROINTESTINAL: [ ]   blood in stool  [ ]   hematemesis GENITOURINARY:  [ ]   dysuria  [ ]    hematuria PSYCHIATRIC:  [ ]  history of major depression INTEGUMENTARY:  [ ]  rashes  [ ]  ulcers CONSTITUTIONAL:  [ ]  fever   [ ]  chills  PHYSICAL EXAMINATION:  General: The patient is a well-nourished female, in no acute distress. Vital signs are BP 111/66 mmHg  Pulse 72  Temp(Src) 97.9 F (36.6 C) (Oral)  Resp 18  Ht 5\' 4"  (1.626 m)  Wt 172 lb 11.2 oz (78.336 kg)  BMI 29.63 kg/m2  SpO2 99% Pulmonary: There is a good air exchange bilaterally without wheezing or rales. Abdomen: Soft and non-tender with normal pitch bowel sounds. Musculoskeletal: There are no major deformities.  There is no significant extremity pain. Neurologic: No focal weakness or paresthesias are detected, Skin: There are no ulcer or rashes noted. Psychiatric: The patient has normal affect. Cardiovascular: There is a regular rate and rhythm without significant murmur appreciated. Carotid arteries without bruits bilaterally Pulse status: 2+ radial and 2+ posterior tibial pulses bilaterally She does have extensive scattered telangiectasia and a fan like distribution on her thighs bilaterally also some more scattered telangiectasia in her calves bilaterally. No lower extremity swelling and no changes of venous hypertension around her ankles. She does have some slightly raise reticular veins in her right posterior calf but no true varicosities.  Her venous reflux study from 07/28/2014. Distention did show reflux in her great and small saphenous vein bilaterally with no evidence of reflux in her deep venous system. No evidence of DVT  Impression and plan bilateral lower extremity discomfort with aching cramping sensation. I did do have a very long discussion with the patient.  I reimaged her saphenous vein with SonoSite ultrasound. There is mild dilatation in her great saphenous veins bilaterally. I am certainly not convinced that her symptoms are related to venous hypertension. I explained that there is really no study to  determine if this would be beneficial to her. I explained that I felt that her chance of improvement in my opinion would be less than 50%. I explained that there would be very minimal risk of proceeding with laser ablation but I'm certainly not convinced she would have benefit from this. I did explain that this was progressive over time this could be easily treated at a later date. She is comfortable with this discussion and wants to see a spine surgeon before proceeding with any venous intervention. I do feel that the telangiectasia separate issue. I feel this has any association with her saphenous vein hypertension as this  is classic pattern of typical telangiectasia. She does wish to consider sclerotherapy for cosmetic treatment of these. I discussed with her the proximal out-of-pocket expense associated with this and we have given her contact information forLiz Marketing executive to schedule sclerotherapy treatments. She was comfortable with this discussion will see [place] again on as-needed basis         Genelle Economou [facility] Office: [phone]                Problems with Activities of Daily Living Secondary to Leg Pain  1. Courtney [name] states all activities that require prolonged standing (cooking, cleaning, shopping) are very difficult for her due to leg pain.    2. Courtney [name] states that exercise (walking, hiking) is difficult for her due to leg pain.   3. Courtney [name] states that yard work is difficult for her due to leg pain.     Failure of  Conservative Therapy:  1. Worn 20-30 mm Hg thigh high compression hose >3 months with no relief of symptoms.  2. Frequently elevates legs-no relief of symptoms  3. Taken Ibuprofen 600 Mg TID with no relief of symptoms.

## 2015-02-12 DIAGNOSIS — M4806 Spinal stenosis, lumbar region: Secondary | ICD-10-CM | POA: Diagnosis not present

## 2015-02-12 DIAGNOSIS — M4316 Spondylolisthesis, lumbar region: Secondary | ICD-10-CM | POA: Diagnosis not present

## 2015-02-12 DIAGNOSIS — M5416 Radiculopathy, lumbar region: Secondary | ICD-10-CM | POA: Diagnosis not present

## 2015-02-12 DIAGNOSIS — Z6828 Body mass index (BMI) 28.0-28.9, adult: Secondary | ICD-10-CM | POA: Diagnosis not present

## 2015-02-16 ENCOUNTER — Encounter: Payer: Self-pay | Admitting: Family Medicine

## 2015-02-17 DIAGNOSIS — M9903 Segmental and somatic dysfunction of lumbar region: Secondary | ICD-10-CM | POA: Diagnosis not present

## 2015-02-17 DIAGNOSIS — M9902 Segmental and somatic dysfunction of thoracic region: Secondary | ICD-10-CM | POA: Diagnosis not present

## 2015-02-17 DIAGNOSIS — M5416 Radiculopathy, lumbar region: Secondary | ICD-10-CM | POA: Diagnosis not present

## 2015-02-17 DIAGNOSIS — M9905 Segmental and somatic dysfunction of pelvic region: Secondary | ICD-10-CM | POA: Diagnosis not present

## 2015-02-17 DIAGNOSIS — M5126 Other intervertebral disc displacement, lumbar region: Secondary | ICD-10-CM | POA: Diagnosis not present

## 2015-02-19 DIAGNOSIS — M9903 Segmental and somatic dysfunction of lumbar region: Secondary | ICD-10-CM | POA: Diagnosis not present

## 2015-02-19 DIAGNOSIS — M5416 Radiculopathy, lumbar region: Secondary | ICD-10-CM | POA: Diagnosis not present

## 2015-02-19 DIAGNOSIS — M5126 Other intervertebral disc displacement, lumbar region: Secondary | ICD-10-CM | POA: Diagnosis not present

## 2015-02-19 DIAGNOSIS — M9905 Segmental and somatic dysfunction of pelvic region: Secondary | ICD-10-CM | POA: Diagnosis not present

## 2015-02-19 DIAGNOSIS — M9902 Segmental and somatic dysfunction of thoracic region: Secondary | ICD-10-CM | POA: Diagnosis not present

## 2015-02-24 DIAGNOSIS — J189 Pneumonia, unspecified organism: Secondary | ICD-10-CM | POA: Diagnosis not present

## 2015-02-24 DIAGNOSIS — R062 Wheezing: Secondary | ICD-10-CM | POA: Diagnosis not present

## 2015-02-27 DIAGNOSIS — M9903 Segmental and somatic dysfunction of lumbar region: Secondary | ICD-10-CM | POA: Diagnosis not present

## 2015-02-27 DIAGNOSIS — M5126 Other intervertebral disc displacement, lumbar region: Secondary | ICD-10-CM | POA: Diagnosis not present

## 2015-02-27 DIAGNOSIS — M9905 Segmental and somatic dysfunction of pelvic region: Secondary | ICD-10-CM | POA: Diagnosis not present

## 2015-02-27 DIAGNOSIS — M5416 Radiculopathy, lumbar region: Secondary | ICD-10-CM | POA: Diagnosis not present

## 2015-02-27 DIAGNOSIS — M9902 Segmental and somatic dysfunction of thoracic region: Secondary | ICD-10-CM | POA: Diagnosis not present

## 2015-03-03 ENCOUNTER — Encounter: Payer: Self-pay | Admitting: *Deleted

## 2015-03-04 ENCOUNTER — Ambulatory Visit: Payer: Medicare Other | Admitting: *Deleted

## 2015-03-06 DIAGNOSIS — M1711 Unilateral primary osteoarthritis, right knee: Secondary | ICD-10-CM | POA: Diagnosis not present

## 2015-03-10 DIAGNOSIS — M9905 Segmental and somatic dysfunction of pelvic region: Secondary | ICD-10-CM | POA: Diagnosis not present

## 2015-03-10 DIAGNOSIS — M9903 Segmental and somatic dysfunction of lumbar region: Secondary | ICD-10-CM | POA: Diagnosis not present

## 2015-03-10 DIAGNOSIS — M5126 Other intervertebral disc displacement, lumbar region: Secondary | ICD-10-CM | POA: Diagnosis not present

## 2015-03-10 DIAGNOSIS — M9902 Segmental and somatic dysfunction of thoracic region: Secondary | ICD-10-CM | POA: Diagnosis not present

## 2015-03-10 DIAGNOSIS — M5416 Radiculopathy, lumbar region: Secondary | ICD-10-CM | POA: Diagnosis not present

## 2015-03-12 DIAGNOSIS — M5416 Radiculopathy, lumbar region: Secondary | ICD-10-CM | POA: Diagnosis not present

## 2015-03-12 DIAGNOSIS — M4316 Spondylolisthesis, lumbar region: Secondary | ICD-10-CM | POA: Diagnosis not present

## 2015-04-07 DIAGNOSIS — R109 Unspecified abdominal pain: Secondary | ICD-10-CM | POA: Diagnosis not present

## 2015-04-07 DIAGNOSIS — Z23 Encounter for immunization: Secondary | ICD-10-CM | POA: Diagnosis not present

## 2015-04-22 DIAGNOSIS — M519 Unspecified thoracic, thoracolumbar and lumbosacral intervertebral disc disorder: Secondary | ICD-10-CM | POA: Diagnosis not present

## 2015-05-08 DIAGNOSIS — R739 Hyperglycemia, unspecified: Secondary | ICD-10-CM | POA: Diagnosis not present

## 2015-05-08 DIAGNOSIS — M5417 Radiculopathy, lumbosacral region: Secondary | ICD-10-CM | POA: Diagnosis not present

## 2015-05-08 DIAGNOSIS — I10 Essential (primary) hypertension: Secondary | ICD-10-CM | POA: Diagnosis not present

## 2015-05-08 DIAGNOSIS — Z780 Asymptomatic menopausal state: Secondary | ICD-10-CM | POA: Diagnosis not present

## 2015-05-19 DIAGNOSIS — M4316 Spondylolisthesis, lumbar region: Secondary | ICD-10-CM | POA: Diagnosis not present

## 2015-05-19 DIAGNOSIS — Z6827 Body mass index (BMI) 27.0-27.9, adult: Secondary | ICD-10-CM | POA: Diagnosis not present

## 2015-05-19 DIAGNOSIS — M5416 Radiculopathy, lumbar region: Secondary | ICD-10-CM | POA: Diagnosis not present

## 2015-05-19 DIAGNOSIS — M4806 Spinal stenosis, lumbar region: Secondary | ICD-10-CM | POA: Diagnosis not present

## 2015-05-20 ENCOUNTER — Other Ambulatory Visit: Payer: Self-pay | Admitting: Neurosurgery

## 2015-05-22 DIAGNOSIS — M5137 Other intervertebral disc degeneration, lumbosacral region: Secondary | ICD-10-CM | POA: Diagnosis not present

## 2015-05-25 ENCOUNTER — Encounter: Payer: Self-pay | Admitting: Family Medicine

## 2015-06-16 ENCOUNTER — Inpatient Hospital Stay (HOSPITAL_COMMUNITY)
Admission: RE | Admit: 2015-06-16 | Discharge: 2015-06-16 | Disposition: A | Discharge status: Home / Self Care | Payer: Medicare Other | Source: Ambulatory Visit

## 2015-06-16 NOTE — Pre-Procedure Instructions (Signed)
    Courtney [name]  06/16/2015      [facility] - [place], [place] - [address] [place] [address] Phone: [phone] Fax: [phone]    Your procedure is scheduled on 06-24-2015   Wednesday    Report to [facility] Admitting at 6:30 A.M.   Call this number if you have problems the morning of surgery:  [phone]   Remember:  Do not eat food or drink liquids after midnight.   Take these medicines the morning of surgery with A SIP OF WATER pain medication if needed, inhaler if needed,metoprolol(Lopressor)               STOP ALL NSAIDS(ALEVE,NAPROXEN,IBUPROFEN,GOODY POWDERS),ASPIRIN AND ANY HERBAL SUPPLEMENTS   Do not wear jewelry, make-up or nail polish.  Do not wear lotions, powders, or perfumes.  You may NOT wear deodorant.  Do not shave 48 hours prior to surgery.   .  Do not bring valuables to the hospital.  [facility] is not responsible for any belongings or valuables.  Contacts, dentures or bridgework may not be worn into surgery.  Leave your suitcase in the car.  After surgery it may be brought to your room.  For patients admitted to the hospital, discharge time will be determined by your treatment team.  Patients discharged the day of surgery will not be allowed to drive home.   Special instructions:  See attached sheet for instructions on CHG showers.  Please read over the following fact sheets that you were given. Pain Booklet, Blood Transfusion Information and Surgical Site Infection Prevention

## 2015-07-01 DIAGNOSIS — M4806 Spinal stenosis, lumbar region: Secondary | ICD-10-CM | POA: Diagnosis not present

## 2015-07-01 DIAGNOSIS — M129 Arthropathy, unspecified: Secondary | ICD-10-CM | POA: Diagnosis not present

## 2015-07-01 DIAGNOSIS — Z6826 Body mass index (BMI) 26.0-26.9, adult: Secondary | ICD-10-CM | POA: Diagnosis not present

## 2015-07-01 DIAGNOSIS — M5137 Other intervertebral disc degeneration, lumbosacral region: Secondary | ICD-10-CM | POA: Diagnosis not present

## 2015-07-10 DIAGNOSIS — R300 Dysuria: Secondary | ICD-10-CM | POA: Diagnosis not present

## 2015-07-22 ENCOUNTER — Encounter (HOSPITAL_COMMUNITY): Payer: Self-pay

## 2015-07-22 ENCOUNTER — Encounter (HOSPITAL_COMMUNITY)
Admission: RE | Admit: 2015-07-22 | Discharge: 2015-07-22 | Disposition: A | Discharge status: Home / Self Care | Payer: Medicare Other | Source: Ambulatory Visit | Attending: Neurosurgery | Admitting: Neurosurgery

## 2015-07-22 DIAGNOSIS — I10 Essential (primary) hypertension: Secondary | ICD-10-CM | POA: Insufficient documentation

## 2015-07-22 DIAGNOSIS — Z0183 Encounter for blood typing: Secondary | ICD-10-CM | POA: Insufficient documentation

## 2015-07-22 DIAGNOSIS — Z01812 Encounter for preprocedural laboratory examination: Secondary | ICD-10-CM | POA: Insufficient documentation

## 2015-07-22 DIAGNOSIS — Z01818 Encounter for other preprocedural examination: Secondary | ICD-10-CM | POA: Diagnosis not present

## 2015-07-22 DIAGNOSIS — I498 Other specified cardiac arrhythmias: Secondary | ICD-10-CM | POA: Insufficient documentation

## 2015-07-22 DIAGNOSIS — M4316 Spondylolisthesis, lumbar region: Secondary | ICD-10-CM | POA: Diagnosis not present

## 2015-07-22 LAB — BASIC METABOLIC PANEL
Anion gap: 14 (ref 5–15)
BUN: 20 mg/dL (ref 6–20)
CHLORIDE: 97 mmol/L — AB (ref 101–111)
CO2: 30 mmol/L (ref 22–32)
Calcium: 10 mg/dL (ref 8.9–10.3)
Creatinine, Ser: 0.84 mg/dL (ref 0.44–1.00)
GFR calc Af Amer: >60 mL/min (ref >60)
GFR calc non Af Amer: >60 mL/min (ref >60)
GLUCOSE: 102 mg/dL — AB (ref 65–99)
POTASSIUM: 4.3 mmol/L (ref 3.5–5.1)
Sodium: 141 mmol/L (ref 135–145)

## 2015-07-22 LAB — CBC
HCT: 40.4 % (ref 36.0–46.0)
HEMOGLOBIN: 13.3 g/dL (ref 12.0–15.0)
MCH: 30.9 pg (ref 26.0–34.0)
MCHC: 32.9 g/dL (ref 30.0–36.0)
MCV: 93.7 fL (ref 78.0–100.0)
Platelets: 220 10*3/uL (ref 150–400)
RBC: 4.31 MIL/uL (ref 3.87–5.11)
RDW: 12.6 % (ref 11.5–15.5)
WBC: 6.8 10*3/uL (ref 4.0–10.5)

## 2015-07-22 LAB — SURGICAL PCR SCREEN
MRSA, PCR: NEGATIVE
STAPHYLOCOCCUS AUREUS: NEGATIVE

## 2015-07-22 LAB — TYPE AND SCREEN
ABO/RH(D): O POS
ANTIBODY SCREEN: NEGATIVE

## 2015-07-22 LAB — ABO/RH: ABO/RH(D): O POS

## 2015-07-22 NOTE — Pre-Procedure Instructions (Signed)
    Florentine Leister Adelstein  07/22/2015      [facility] - [place], [place] - [place] [address] Phone: [phone] Fax: [phone]    Your procedure is scheduled on 07/29/15.  Report to [facility] Admitting at 630 A.M.  Call this number if you have problems the morning of surgery:  [phone]   Remember:  Do not eat food or drink liquids after midnight.  Take these medicines the morning of surgery with A SIP OF WATER --hydrocodone,metoprolol,all inhalers   Do not wear jewelry, make-up or nail polish.  Do not wear lotions, powders, or perfumes.  You may wear deodorant.  Do not shave 48 hours prior to surgery.  Men may shave face and neck.  Do not bring valuables to the hospital.  [facility] is not responsible for any belongings or valuables.  Contacts, dentures or bridgework may not be worn into surgery.  Leave your suitcase in the car.  After surgery it may be brought to your room.  For patients admitted to the hospital, discharge time will be determined by your treatment team.  Patients discharged the day of surgery will not be allowed to drive home Name and phone number of your driver:    Special instructions:    Please read over the following fact sheets that you were given. Pain Booklet, Coughing and Deep Breathing, Blood Transfusion Information, MRSA Information and Surgical Site Infection Prevention

## 2015-07-28 MED ORDER — CEFAZOLIN SODIUM-DEXTROSE 2-3 GM-% IV SOLR
2.0000 g | INTRAVENOUS | Status: AC
  Administered 2015-07-29 (×2): 2 g via INTRAVENOUS

## 2015-07-28 NOTE — Anesthesia Preprocedure Evaluation (Addendum)
Anesthesia Evaluation  Patient identified by MRN, date of birth, ID band Patient awake    Reviewed: Allergy & Precautions, H&P , NPO status , Patient's Chart, lab work & pertinent test results, reviewed documented beta blocker date and time   History of Anesthesia Complications (+) PONV  Airway Mallampati: II  TM Distance: >3 FB Neck ROM: full    Dental  (+) Dental Advisory Given, Caps,  All upper front are capped:   Pulmonary neg pulmonary ROS, pneumonia, resolved,    Pulmonary exam normal breath sounds clear to auscultation       Cardiovascular hypertension, Pt. on home beta blockers Normal cardiovascular exam+ Valvular Problems/Murmurs  Rhythm:regular Rate:Normal     Neuro/Psych negative neurological ROS  negative psych ROS   GI/Hepatic negative GI ROS, Neg liver ROS,   Endo/Other  negative endocrine ROS  Renal/GU negative Renal ROS  negative genitourinary   Musculoskeletal  (+) Arthritis ,   Abdominal   Peds  Hematology negative hematology ROS (+) anemia ,   Anesthesia Other Findings   Reproductive/Obstetrics negative OB ROS                          Anesthesia Physical Anesthesia Plan  ASA: II  Anesthesia Plan: General   Post-op Pain Management:    Induction: Intravenous  Airway Management Planned: Oral ETT  Additional Equipment:   Intra-op Plan:   Post-operative Plan: Extubation in OR  Informed Consent: I have reviewed the patients History and Physical, chart, labs and discussed the procedure including the risks, benefits and alternatives for the proposed anesthesia with the patient or authorized representative who has indicated his/her understanding and acceptance.   Dental Advisory Given  Plan Discussed with: CRNA and Surgeon  Anesthesia Plan Comments:         Anesthesia Quick Evaluation

## 2015-07-29 ENCOUNTER — Inpatient Hospital Stay (HOSPITAL_COMMUNITY)
Admission: RE | Admit: 2015-07-29 | Discharge: 2015-08-01 | DRG: 460 | Disposition: A | Discharge status: Home / Self Care | Payer: Medicare Other | Source: Ambulatory Visit | Attending: Neurosurgery | Admitting: Neurosurgery

## 2015-07-29 ENCOUNTER — Inpatient Hospital Stay (HOSPITAL_COMMUNITY): Payer: Medicare Other | Admitting: Anesthesiology

## 2015-07-29 ENCOUNTER — Inpatient Hospital Stay (HOSPITAL_COMMUNITY): Payer: Medicare Other

## 2015-07-29 ENCOUNTER — Encounter (HOSPITAL_COMMUNITY)
Admission: RE | Disposition: A | Discharge status: Home / Self Care | Payer: Self-pay | Source: Ambulatory Visit | Attending: Neurosurgery

## 2015-07-29 ENCOUNTER — Encounter (HOSPITAL_COMMUNITY): Payer: Self-pay | Admitting: Anesthesiology

## 2015-07-29 DIAGNOSIS — Z9889 Other specified postprocedural states: Secondary | ICD-10-CM | POA: Diagnosis not present

## 2015-07-29 DIAGNOSIS — M1990 Unspecified osteoarthritis, unspecified site: Secondary | ICD-10-CM | POA: Diagnosis not present

## 2015-07-29 DIAGNOSIS — M4806 Spinal stenosis, lumbar region: Secondary | ICD-10-CM | POA: Diagnosis present

## 2015-07-29 DIAGNOSIS — M5116 Intervertebral disc disorders with radiculopathy, lumbar region: Secondary | ICD-10-CM | POA: Diagnosis present

## 2015-07-29 DIAGNOSIS — I10 Essential (primary) hypertension: Secondary | ICD-10-CM | POA: Diagnosis present

## 2015-07-29 DIAGNOSIS — M549 Dorsalgia, unspecified: Secondary | ICD-10-CM | POA: Diagnosis not present

## 2015-07-29 DIAGNOSIS — M4316 Spondylolisthesis, lumbar region: Principal | ICD-10-CM | POA: Diagnosis present

## 2015-07-29 DIAGNOSIS — E785 Hyperlipidemia, unspecified: Secondary | ICD-10-CM | POA: Diagnosis present

## 2015-07-29 DIAGNOSIS — M419 Scoliosis, unspecified: Secondary | ICD-10-CM | POA: Diagnosis present

## 2015-07-29 DIAGNOSIS — D649 Anemia, unspecified: Secondary | ICD-10-CM | POA: Diagnosis not present

## 2015-07-29 DIAGNOSIS — Z419 Encounter for procedure for purposes other than remedying health state, unspecified: Secondary | ICD-10-CM

## 2015-07-29 HISTORY — PX: LUMBAR FUSION: SHX111

## 2015-07-29 HISTORY — DX: Personal history of urinary (tract) infections: Z87.440

## 2015-07-29 HISTORY — DX: Cardiac murmur, unspecified: R01.1

## 2015-07-29 SURGERY — POSTERIOR LUMBAR FUSION 1 LEVEL
Anesthesia: General | Site: Back

## 2015-07-29 MED ORDER — METOPROLOL TARTRATE 25 MG PO TABS
25.0000 mg | ORAL_TABLET | Freq: Two times a day (BID) | ORAL | Status: DC
  Administered 2015-07-30 – 2015-08-01 (×4): 25 mg via ORAL
  Filled 2015-07-29 (×5): qty 1

## 2015-07-29 MED ORDER — ARTIFICIAL TEARS OP OINT
TOPICAL_OINTMENT | OPHTHALMIC | Status: DC | PRN
  Administered 2015-07-29: 1 via OPHTHALMIC

## 2015-07-29 MED ORDER — FENTANYL CITRATE (PF) 250 MCG/5ML IJ SOLN
INTRAMUSCULAR | Status: AC
  Filled 2015-07-29: qty 5

## 2015-07-29 MED ORDER — MORPHINE SULFATE (PF) 4 MG/ML IV SOLN
INTRAVENOUS | Status: AC
  Administered 2015-07-29: 4 mg
  Filled 2015-07-29: qty 1

## 2015-07-29 MED ORDER — MORPHINE SULFATE (PF) 2 MG/ML IV SOLN
1.0000 mg | INTRAVENOUS | Status: DC | PRN
  Administered 2015-07-29: 3.5 mg via INTRAVENOUS
  Administered 2015-07-29 – 2015-07-30 (×4): 2 mg via INTRAVENOUS
  Administered 2015-07-30 (×3): 3 mg via INTRAVENOUS
  Administered 2015-07-31 (×4): 2 mg via INTRAVENOUS
  Filled 2015-07-29: qty 1
  Filled 2015-07-29: qty 2
  Filled 2015-07-29: qty 1
  Filled 2015-07-29 (×2): qty 2
  Filled 2015-07-29 (×5): qty 1
  Filled 2015-07-29: qty 2
  Filled 2015-07-29 (×2): qty 1

## 2015-07-29 MED ORDER — ONDANSETRON HCL 4 MG/2ML IJ SOLN
INTRAMUSCULAR | Status: DC | PRN
  Administered 2015-07-29: 4 mg via INTRAVENOUS

## 2015-07-29 MED ORDER — PRAVASTATIN SODIUM 20 MG PO TABS
20.0000 mg | ORAL_TABLET | Freq: Every day | ORAL | Status: DC
Start: 2015-07-29 — End: 2015-08-01
  Administered 2015-07-30 – 2015-07-31 (×2): 20 mg via ORAL
  Filled 2015-07-29 (×2): qty 1

## 2015-07-29 MED ORDER — BISACODYL 10 MG RE SUPP: 10.0000 mg | Freq: Every day | RECTAL | Status: DC | PRN

## 2015-07-29 MED ORDER — DEXAMETHASONE SODIUM PHOSPHATE 4 MG/ML IJ SOLN
INTRAMUSCULAR | Status: DC | PRN
  Administered 2015-07-29: 8 mg via INTRAVENOUS

## 2015-07-29 MED ORDER — ALBUTEROL SULFATE (2.5 MG/3ML) 0.083% IN NEBU
3.0000 mL | INHALATION_SOLUTION | RESPIRATORY_TRACT | Status: DC | PRN
Start: 2015-07-29 — End: 2015-08-01

## 2015-07-29 MED ORDER — LIDOCAINE HCL (CARDIAC) 20 MG/ML IV SOLN
INTRAVENOUS | Status: AC
  Filled 2015-07-29: qty 5

## 2015-07-29 MED ORDER — ALUM & MAG HYDROXIDE-SIMETH 200-200-20 MG/5ML PO SUSP: 30.0000 mL | Freq: Four times a day (QID) | ORAL | Status: DC | PRN

## 2015-07-29 MED ORDER — HYDROMORPHONE HCL 1 MG/ML IJ SOLN
0.5000 mg | INTRAMUSCULAR | Status: DC | PRN
  Administered 2015-07-29 (×2): 0.5 mg via INTRAVENOUS

## 2015-07-29 MED ORDER — PROPOFOL 10 MG/ML IV BOLUS
INTRAVENOUS | Status: DC | PRN
  Administered 2015-07-29: 160 mg via INTRAVENOUS

## 2015-07-29 MED ORDER — FUROSEMIDE 20 MG PO TABS: 20.0000 mg | ORAL_TABLET | Freq: Every day | ORAL | Status: DC | PRN

## 2015-07-29 MED ORDER — THROMBIN 20000 UNITS EX SOLR
CUTANEOUS | Status: DC | PRN
  Administered 2015-07-29: 20 mL via TOPICAL

## 2015-07-29 MED ORDER — ACETAMINOPHEN 325 MG PO TABS
650.0000 mg | ORAL_TABLET | ORAL | Status: DC | PRN
  Administered 2015-07-31 – 2015-08-01 (×2): 650 mg via ORAL
  Filled 2015-07-29 (×2): qty 2

## 2015-07-29 MED ORDER — ONDANSETRON HCL 4 MG/2ML IJ SOLN
4.0000 mg | INTRAMUSCULAR | Status: DC | PRN
  Administered 2015-07-29: 4 mg via INTRAVENOUS

## 2015-07-29 MED ORDER — OXYCODONE-ACETAMINOPHEN 5-325 MG PO TABS
ORAL_TABLET | ORAL | Status: AC
  Administered 2015-07-29: 2 via ORAL
  Filled 2015-07-29: qty 2

## 2015-07-29 MED ORDER — BUPIVACAINE-EPINEPHRINE (PF) 0.5% -1:200000 IJ SOLN
INTRAMUSCULAR | Status: DC | PRN
  Administered 2015-07-29: 10 mL via PERINEURAL

## 2015-07-29 MED ORDER — ACETAMINOPHEN 650 MG RE SUPP: 650.0000 mg | RECTAL | Status: DC | PRN

## 2015-07-29 MED ORDER — 0.9 % SODIUM CHLORIDE (POUR BTL) OPTIME
TOPICAL | Status: DC | PRN
  Administered 2015-07-29: 1000 mL

## 2015-07-29 MED ORDER — MIDAZOLAM HCL 2 MG/2ML IJ SOLN
0.5000 mg | Freq: Once | INTRAMUSCULAR | Status: AC
Start: 2015-07-29 — End: 2015-07-29
  Administered 2015-07-29: 2 mg via INTRAVENOUS

## 2015-07-29 MED ORDER — LACTATED RINGERS IV SOLN
INTRAVENOUS | Status: DC | PRN
  Administered 2015-07-29 (×2): via INTRAVENOUS

## 2015-07-29 MED ORDER — DOCUSATE SODIUM 100 MG PO CAPS
100.0000 mg | ORAL_CAPSULE | Freq: Two times a day (BID) | ORAL | Status: DC
  Administered 2015-07-30 – 2015-08-01 (×5): 100 mg via ORAL
  Filled 2015-07-29 (×5): qty 1

## 2015-07-29 MED ORDER — CEFAZOLIN SODIUM-DEXTROSE 2-3 GM-% IV SOLR
2.0000 g | Freq: Three times a day (TID) | INTRAVENOUS | Status: AC
Start: 2015-07-29 — End: 2015-07-30
  Administered 2015-07-29 – 2015-07-30 (×2): 2 g via INTRAVENOUS
  Filled 2015-07-29 (×2): qty 50

## 2015-07-29 MED ORDER — PHENYLEPHRINE HCL 10 MG/ML IJ SOLN
INTRAMUSCULAR | Status: DC | PRN
  Administered 2015-07-29: 120 ug via INTRAVENOUS
  Administered 2015-07-29: 80 ug via INTRAVENOUS
  Administered 2015-07-29: 120 ug via INTRAVENOUS
  Administered 2015-07-29 (×2): 80 ug via INTRAVENOUS
  Administered 2015-07-29: 120 ug via INTRAVENOUS
  Administered 2015-07-29: 80 ug via INTRAVENOUS
  Administered 2015-07-29: 120 ug via INTRAVENOUS

## 2015-07-29 MED ORDER — PROPOFOL 10 MG/ML IV BOLUS
INTRAVENOUS | Status: AC
  Filled 2015-07-29: qty 20

## 2015-07-29 MED ORDER — ROCURONIUM BROMIDE 50 MG/5ML IV SOLN
INTRAVENOUS | Status: AC
  Filled 2015-07-29: qty 1

## 2015-07-29 MED ORDER — OXYCODONE-ACETAMINOPHEN 5-325 MG PO TABS
1.0000 | ORAL_TABLET | ORAL | Status: DC | PRN
  Administered 2015-07-29 – 2015-07-30 (×4): 2 via ORAL
  Administered 2015-07-31: 1 via ORAL
  Administered 2015-07-31 – 2015-08-01 (×3): 2 via ORAL
  Filled 2015-07-29 (×6): qty 2
  Filled 2015-07-29: qty 1

## 2015-07-29 MED ORDER — MIDAZOLAM HCL 5 MG/5ML IJ SOLN
INTRAMUSCULAR | Status: DC | PRN
  Administered 2015-07-29: 2 mg via INTRAVENOUS

## 2015-07-29 MED ORDER — ONDANSETRON HCL 4 MG/2ML IJ SOLN
INTRAMUSCULAR | Status: AC
  Filled 2015-07-29: qty 2

## 2015-07-29 MED ORDER — HYDROMORPHONE HCL 1 MG/ML IJ SOLN
INTRAMUSCULAR | Status: AC
  Administered 2015-07-29: 0.5 mg via INTRAVENOUS
  Filled 2015-07-29: qty 1

## 2015-07-29 MED ORDER — MIDAZOLAM HCL 2 MG/2ML IJ SOLN
INTRAMUSCULAR | Status: AC
  Filled 2015-07-29: qty 2

## 2015-07-29 MED ORDER — GLYCOPYRROLATE 0.2 MG/ML IJ SOLN
INTRAMUSCULAR | Status: DC | PRN
  Administered 2015-07-29: 0.4 mg via INTRAVENOUS

## 2015-07-29 MED ORDER — POTASSIUM CHLORIDE CRYS ER 10 MEQ PO TBCR: 10.0000 meq | EXTENDED_RELEASE_TABLET | Freq: Every day | ORAL | Status: DC | PRN

## 2015-07-29 MED ORDER — DIAZEPAM 5 MG PO TABS
ORAL_TABLET | ORAL | Status: AC
  Administered 2015-07-29: 5 mg via ORAL
  Filled 2015-07-29: qty 1

## 2015-07-29 MED ORDER — LACTATED RINGERS IV SOLN: INTRAVENOUS | Status: DC

## 2015-07-29 MED ORDER — HYDROCODONE-ACETAMINOPHEN 7.5-325 MG PO TABS: 1.0000 | ORAL_TABLET | ORAL | Status: DC | PRN

## 2015-07-29 MED ORDER — DEXAMETHASONE SODIUM PHOSPHATE 4 MG/ML IJ SOLN
INTRAMUSCULAR | Status: AC
  Filled 2015-07-29: qty 2

## 2015-07-29 MED ORDER — ADULT MULTIVITAMIN W/MINERALS CH
1.0000 | ORAL_TABLET | Freq: Every day | ORAL | Status: DC
  Administered 2015-07-30 – 2015-08-01 (×3): 1 via ORAL
  Filled 2015-07-29 (×3): qty 1

## 2015-07-29 MED ORDER — BUPIVACAINE LIPOSOME 1.3 % IJ SUSP
20.0000 mL | Freq: Once | INTRAMUSCULAR | Status: DC
  Filled 2015-07-29: qty 20

## 2015-07-29 MED ORDER — FENTANYL CITRATE (PF) 100 MCG/2ML IJ SOLN
INTRAMUSCULAR | Status: DC | PRN
Start: 2015-07-29 — End: 2015-07-29
  Administered 2015-07-29: 100 ug via INTRAVENOUS
  Administered 2015-07-29 (×3): 50 ug via INTRAVENOUS

## 2015-07-29 MED ORDER — HYDROCODONE-ACETAMINOPHEN 5-325 MG PO TABS: 1.0000 | ORAL_TABLET | ORAL | Status: DC | PRN

## 2015-07-29 MED ORDER — VANCOMYCIN HCL 1000 MG IV SOLR
INTRAVENOUS | Status: AC
  Filled 2015-07-29: qty 1000

## 2015-07-29 MED ORDER — HYDROMORPHONE HCL 1 MG/ML IJ SOLN
0.2500 mg | INTRAMUSCULAR | Status: DC | PRN
  Administered 2015-07-29 (×4): 0.5 mg via INTRAVENOUS

## 2015-07-29 MED ORDER — NEOSTIGMINE METHYLSULFATE 10 MG/10ML IV SOLN
INTRAVENOUS | Status: DC | PRN
  Administered 2015-07-29: 3 mg via INTRAVENOUS

## 2015-07-29 MED ORDER — SODIUM CHLORIDE 0.9 % IR SOLN
Status: DC | PRN
  Administered 2015-07-29: 500 mL

## 2015-07-29 MED ORDER — LIDOCAINE HCL (CARDIAC) 20 MG/ML IV SOLN
INTRAVENOUS | Status: DC | PRN
  Administered 2015-07-29: 60 mg via INTRAVENOUS

## 2015-07-29 MED ORDER — ROCURONIUM BROMIDE 100 MG/10ML IV SOLN
INTRAVENOUS | Status: DC | PRN
  Administered 2015-07-29: 50 mg via INTRAVENOUS

## 2015-07-29 MED ORDER — PHENOL 1.4 % MT LIQD: 1.0000 | OROMUCOSAL | Status: DC | PRN

## 2015-07-29 MED ORDER — MENTHOL 3 MG MT LOZG: 1.0000 | LOZENGE | OROMUCOSAL | Status: DC | PRN

## 2015-07-29 MED ORDER — CENTRUM SILVER ADULT 50+ PO TABS: ORAL_TABLET | Freq: Every day | ORAL | Status: DC

## 2015-07-29 MED ORDER — VANCOMYCIN HCL 1000 MG IV SOLR
INTRAVENOUS | Status: DC | PRN
  Administered 2015-07-29: 1000 mg

## 2015-07-29 MED ORDER — BUPIVACAINE LIPOSOME 1.3 % IJ SUSP
INTRAMUSCULAR | Status: DC | PRN
  Administered 2015-07-29: 20 mL

## 2015-07-29 MED ORDER — DIAZEPAM 5 MG PO TABS
5.0000 mg | ORAL_TABLET | Freq: Four times a day (QID) | ORAL | Status: DC | PRN
  Administered 2015-07-29 – 2015-07-31 (×6): 5 mg via ORAL
  Filled 2015-07-29 (×6): qty 1

## 2015-07-29 MED ORDER — PHENYLEPHRINE HCL 10 MG/ML IJ SOLN
10.0000 mg | INTRAVENOUS | Status: DC | PRN
  Administered 2015-07-29: 40 ug/min via INTRAVENOUS

## 2015-07-29 MED FILL — Heparin Sodium (Porcine) Inj 1000 Unit/ML: INTRAMUSCULAR | Qty: 30 | Status: AC

## 2015-07-29 MED FILL — Sodium Chloride IV Soln 0.9%: INTRAVENOUS | Qty: 1000 | Status: AC

## 2015-07-29 SURGICAL SUPPLY — 62 items
APL SKNCLS STERI-STRIP NONHPOA (GAUZE/BANDAGES/DRESSINGS) ×1
BAG DECANTER FOR FLEXI CONT (MISCELLANEOUS) ×3 IMPLANT
BENZOIN TINCTURE PRP APPL 2/3 (GAUZE/BANDAGES/DRESSINGS) ×3 IMPLANT
BLADE CLIPPER SURG (BLADE) IMPLANT
BRUSH SCRUB EZ PLAIN DRY (MISCELLANEOUS) ×3 IMPLANT
BUR MATCHSTICK NEURO 3.0 LAGG (BURR) ×3 IMPLANT
BUR PRECISION FLUTE 6.0 (BURR) ×3 IMPLANT
CANISTER SUCT 3000ML PPV (MISCELLANEOUS) ×3 IMPLANT
CAP REVERE LOCKING (Cap) ×8 IMPLANT
CLOSURE WOUND 1/2 X4 (GAUZE/BANDAGES/DRESSINGS) ×1
CONT SPEC 4OZ CLIKSEAL STRL BL (MISCELLANEOUS) ×3 IMPLANT
COVER BACK TABLE 60X90IN (DRAPES) ×3 IMPLANT
DRAPE C-ARM 42X72 X-RAY (DRAPES) ×6 IMPLANT
DRAPE LAPAROTOMY 100X72X124 (DRAPES) ×3 IMPLANT
DRAPE POUCH INSTRU U-SHP 10X18 (DRAPES) ×3 IMPLANT
DRAPE PROXIMA HALF (DRAPES) ×3 IMPLANT
DRAPE SURG 17X23 STRL (DRAPES) ×12 IMPLANT
ELECT BLADE 4.0 EZ CLEAN MEGAD (MISCELLANEOUS) ×3
ELECT REM PT RETURN 9FT ADLT (ELECTROSURGICAL) ×3
ELECTRODE BLDE 4.0 EZ CLN MEGD (MISCELLANEOUS) ×1 IMPLANT
ELECTRODE REM PT RTRN 9FT ADLT (ELECTROSURGICAL) ×1 IMPLANT
EVACUATOR 1/8 PVC DRAIN (DRAIN) IMPLANT
GAUZE SPONGE 4X4 12PLY STRL (GAUZE/BANDAGES/DRESSINGS) ×3 IMPLANT
GAUZE SPONGE 4X4 16PLY XRAY LF (GAUZE/BANDAGES/DRESSINGS) ×3 IMPLANT
GLOVE BIO SURGEON STRL SZ8 (GLOVE) ×6 IMPLANT
GLOVE BIO SURGEON STRL SZ8.5 (GLOVE) ×6 IMPLANT
GLOVE EXAM NITRILE LRG STRL (GLOVE) IMPLANT
GLOVE EXAM NITRILE MD LF STRL (GLOVE) IMPLANT
GLOVE EXAM NITRILE XL STR (GLOVE) IMPLANT
GLOVE EXAM NITRILE XS STR PU (GLOVE) IMPLANT
GOWN STRL REUS W/ TWL LRG LVL3 (GOWN DISPOSABLE) IMPLANT
GOWN STRL REUS W/ TWL XL LVL3 (GOWN DISPOSABLE) ×2 IMPLANT
GOWN STRL REUS W/TWL 2XL LVL3 (GOWN DISPOSABLE) IMPLANT
GOWN STRL REUS W/TWL LRG LVL3 (GOWN DISPOSABLE)
GOWN STRL REUS W/TWL XL LVL3 (GOWN DISPOSABLE) ×6
KIT BASIN OR (CUSTOM PROCEDURE TRAY) ×3 IMPLANT
KIT ROOM TURNOVER OR (KITS) ×3 IMPLANT
NDL HYPO 21X1.5 SAFETY (NEEDLE) IMPLANT
NEEDLE HYPO 21X1.5 SAFETY (NEEDLE) IMPLANT
NEEDLE HYPO 22GX1.5 SAFETY (NEEDLE) ×3 IMPLANT
NS IRRIG 1000ML POUR BTL (IV SOLUTION) ×3 IMPLANT
PACK LAMINECTOMY NEURO (CUSTOM PROCEDURE TRAY) ×3 IMPLANT
PAD ARMBOARD 7.5X6 YLW CONV (MISCELLANEOUS) ×9 IMPLANT
PATTIES SURGICAL .5 X1 (DISPOSABLE) IMPLANT
ROD REVERE 6.35 45MM (Rod) ×4 IMPLANT
SCREW REVERE 6.35 6.5MMX45 (Screw) ×4 IMPLANT
SCREW REVERE 6.5X50MM (Screw) ×4 IMPLANT
SPACER ALTERA 10X31 8-12MM-8 (Spacer) ×2 IMPLANT
SPONGE LAP 4X18 X RAY DECT (DISPOSABLE) IMPLANT
SPONGE NEURO XRAY DETECT 1X3 (DISPOSABLE) IMPLANT
SPONGE SURGIFOAM ABS GEL 100 (HEMOSTASIS) ×3 IMPLANT
STRIP BIOACTIVE 20CC 25X100X8 (Miscellaneous) ×2 IMPLANT
STRIP CLOSURE SKIN 1/2X4 (GAUZE/BANDAGES/DRESSINGS) ×2 IMPLANT
SUT VIC AB 1 CT1 18XBRD ANBCTR (SUTURE) ×2 IMPLANT
SUT VIC AB 1 CT1 8-18 (SUTURE) ×6
SUT VIC AB 2-0 CP2 18 (SUTURE) ×6 IMPLANT
TAPE CLOTH SURG 4X10 WHT LF (GAUZE/BANDAGES/DRESSINGS) ×2 IMPLANT
TAPE STRIPS DRAPE STRL (GAUZE/BANDAGES/DRESSINGS) ×2 IMPLANT
TOWEL OR 17X24 6PK STRL BLUE (TOWEL DISPOSABLE) ×3 IMPLANT
TOWEL OR 17X26 10 PK STRL BLUE (TOWEL DISPOSABLE) ×3 IMPLANT
TRAY FOLEY W/METER SILVER 14FR (SET/KITS/TRAYS/PACK) ×3 IMPLANT
WATER STERILE IRR 1000ML POUR (IV SOLUTION) ×3 IMPLANT

## 2015-07-29 NOTE — Progress Notes (Signed)
Utilization review completed.  

## 2015-07-29 NOTE — H&P (Signed)
Subjective: The patient is a 70 year old white female who has complained of chronic back and leg pain consistent with neurogenic claudication. 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 I discussed the situation with the patient. We discussed the various treatment options including surgery. She has decided to proceed with an L4-5 decompression, agitation, and fusion.   Past Medical History  Diagnosis Date  . Varicose veins   . Hyperlipidemia   . Arthritis   . Anemia   . Hypertension   . Recovering alcoholic [facility])     Past Surgical History  Procedure Laterality Date  . Appendectomy    . Abdominal hysterectomy  1991  . Removal of cervical disc fragments  10-2007  . Lap band surgery  1981  . Breast enhancement surgery  2006    Allergies  Allergen Reactions  . Levofloxacin Hives and Shortness Of Breath    Other reaction(s): Other (See Comments) Caused shortness of breath and swelling, RASH IN MOUTH   (can take IV route)  . Gabapentin Nausea Only  . Prednisone Itching  . Statins     Other reaction(s): Myalgias (intolerance)    Social History  Substance Use Topics  . Smoking status: Never Smoker   . Smokeless tobacco: Never Used  . Alcohol Use: No     Comment: recovery x 20 yrs    Family History  Problem Relation Age of Onset  . Heart disease Mother   . Heart disease Father   . Heart disease Brother    Prior to Admission medications   Medication Sig Start Date End Date Taking? Authorizing Provider  furosemide (LASIX) 20 MG tablet Take 20 mg by mouth daily as needed for fluid.  01/13/15  Yes Historical Provider, MD  HYDROcodone-acetaminophen (NORCO) 7.5-325 MG per tablet Take 1 tablet by mouth daily as needed for moderate pain.  07/16/14  Yes Historical Provider, MD  metoprolol tartrate (LOPRESSOR) 25 MG tablet Take 25 mg by mouth 2 (two)  times daily. 06/27/14  Yes Historical Provider, MD  Multiple Vitamins-Minerals (CENTRUM SILVER ADULT 50+ PO) Take 1 tablet by mouth daily.   Yes Historical Provider, MD  potassium chloride (K-DUR,KLOR-CON) 10 MEQ tablet Take 10 mEq by mouth daily as needed (when taking lasix).  01/16/15  Yes Historical Provider, MD  pravastatin (PRAVACHOL) 20 MG tablet Take 20 mg by mouth daily. 06/28/14  Yes Historical Provider, MD  vitamin B-12 (CYANOCOBALAMIN) 1000 MCG tablet Inject 1,000 mcg as directed every 30 (thirty) days. Reported on 06/10/2015   Yes Historical Provider, MD  PROAIR HFA 108 (90 BASE) MCG/ACT inhaler Inhale 2 puffs into the lungs as needed for wheezing or shortness of breath.  05/21/14   Historical Provider, MD     Review of Systems  Positive ROS: As above  All other systems have been reviewed and were otherwise negative with the exception of those mentioned in the HPI and as above.  Objective: Vital signs in last 24 hours: Temp:  [97.9 F (36.6 C)] 97.9 F (36.6 C) (02/08 0647) Pulse Rate:  [87] 87 (02/08 0647) Resp:  [20] 20 (02/08 0647) BP: (113)/(62) 113/62 mmHg (02/08 0647) SpO2:  [97 %] 97 % (02/08 0647) Weight:  [79.833 kg (176 lb)] 79.833 kg (176 lb) (02/08 0647)  General Appearance: Alert, cooperative, no distress, Head: Normocephalic, without obvious abnormality, atraumatic Eyes: PERRL, conjunctiva/corneas clear, EOM's intact,    Ears:  Normal  Throat: Normal  Neck: Supple, symmetrical, trachea midline, no adenopathy; thyroid: No enlargement/tenderness/nodules; no carotid bruit or JVD Back: Symmetric, no curvature, ROM normal, no CVA tenderness Lungs: Clear to auscultation bilaterally, respirations unlabored Heart: Regular rate and rhythm, no murmur, rub or gallop Abdomen: Soft, non-tender,, no masses, no organomegaly Extremities: Extremities normal, atraumatic, no cyanosis or edema Pulses: 2+ and symmetric all extremities Skin: Skin color, texture, turgor normal, no  rashes or lesions  NEUROLOGIC:   Mental status: alert and oriented, no aphasia, good attention span, Fund of knowledge/ memory ok Motor Exam - grossly normal Sensory Exam - grossly normal Reflexes:  Coordination - grossly normal Gait - grossly normal Balance - grossly normal Cranial Nerves: I: smell Not tested  II: visual acuity  OS: Normal  OD: Normal   II: visual fields Full to confrontation  II: pupils Equal, round, reactive to light  III,VII: ptosis None  III,IV,VI: extraocular muscles  Full ROM  V: mastication Normal  V: facial light touch sensation  Normal  V,VII: corneal reflex  Present  VII: facial muscle function - upper  Normal  VII: facial muscle function - lower Normal  VIII: hearing Not tested  IX: soft palate elevation  Normal  IX,X: gag reflex Present  XI: trapezius strength  5/5  XI: sternocleidomastoid strength 5/5  XI: neck flexion strength  5/5  XII: tongue strength  Normal    Data Review Lab Results  Component Value Date   WBC 6.8 07/22/2015   HGB 13.3 07/22/2015   HCT 40.4 07/22/2015   MCV 93.7 07/22/2015   PLT 220 07/22/2015   Lab Results  Component Value Date   NA 141 07/22/2015   K 4.3 07/22/2015   CL 97* 07/22/2015   CO2 30 07/22/2015   BUN 20 07/22/2015   CREATININE 0.84 07/22/2015   GLUCOSE 102* 07/22/2015   No results found for: INR, PROTIME  Assessment/Plan: L4-5 spondylolisthesis, spinal stenosis, lumbago, lumbar radiculopathy, neurogenic claudication: I have discussed the situation with the patient. I have reviewed her imaging studies with her and pointed out the abnormalities. We have discussed the various treatment options including surgery. I have described the surgical treatment option of an L4-5 decompression, instrumentation and fusion. I have shown her surgical models. We have discussed the risks, benefits, alternatives, and likelihood of achieving her goals with surgery. I have answered all the patient's questions. She has  decided proceed with surgery   Tahirah Sara D 07/29/2015 8:08 AM

## 2015-07-29 NOTE — Op Note (Signed)
Brief history: The patient is a 70 year old white female who has had a previous L4-5 laminectomy and discectomy. She has complained of worsening back, buttock, and leg pain consistent with a lumbar radiculopathy. She failed medical management and was worked up with a lumbar MRI and x-rays which demonstrated a thoracic lumbar scoliosis, diffuse degenerative changes, with a L4-5 spondylolisthesis with foraminal stenosis. I discussed the situation with the patient and reviewed her imaging studies with her. We discussed the various treatment options including surgery. I have described an L4-5 decompression, instrumentation, and fusion. She has weighed the risks, benefits, and alternative surgery and decided to proceed with the operation.  Preoperative diagnosis: L4-5 spondylolisthesis, Degenerative disc disease, spinal stenosis compressing both the L4 and the L5 nerve roots; lumbago; lumbar radiculopathy, thoracolumbar scoliosis  Postoperative diagnosis: The same  Procedure: Bilateral L4-5 Laminotomy/foraminotomies to decompress the bilateral L4 and L5 nerve roots(the work required to do this was in addition to the work required to do the posterior lumbar interbody fusion because of the patient's spinal stenosis, facet arthropathy. Etc. requiring a wide decompression of the nerve roots.); L4-5 posterior lumbar interbody fusion with local morselized autograft bone and Kinnex graft extender; insertion of interbody prosthesis at L4-5 (globus peek expandable interbody prosthesis); posterior nonsegmental instrumentation from L4 to L5 with globus titanium pedicle screws and rods; posterior lateral arthrodesis at L4-5 with local morselized autograft bone and Kinnex bone graft extender.  Surgeon: Dr. Earle Gell  Asst.: Dr. Dayton Bailiff  Anesthesia: Gen. endotracheal  Estimated blood loss: 200 mL  Drains: None   Complications: None  Description of procedure: The patient was brought to the operating room by  the anesthesia team. General endotracheal anesthesia was induced. The patient was turned to the prone position on the Wilson frame. The patient's lumbosacral region was then prepared with Betadine scrub and Betadine solution. Sterile drapes were applied.  I then injected the area to be incised with Marcaine with epinephrine solution. I then used the scalpel to make a linear midline incision over the L4-5 interspace, incising through the old surgical scar. I then used electrocautery to perform a bilateral subperiosteal dissection exposing the spinous process and lamina of L4 and L5. We then obtained intraoperative radiograph to confirm our location. We then inserted the Verstrac retractor to provide exposure.  I began the decompression by using the high speed drill to perform laminotomies at L4-5 bilaterally. We encountered scar tissue at L4-5 on the right from the prior operation. We then used the Kerrison punches to widen the laminotomy and removed the ligamentum flavum at L4-5 on the left and to remove the scar tissue at L4-5 on the right. We used the Kerrison punches to remove the medial facets at L4-5 bilaterally. We performed wide foraminotomies about the bilateral L4 and L5 nerve roots completing the decompression.  We now turned our attention to the posterior lumbar interbody fusion. I used a scalpel to incise the intervertebral disc at L4-5 bilaterally, the disc space was quite collapsed. I then performed a partial intervertebral discectomy at L4-5 bilaterally using the pituitary forceps. We prepared the vertebral endplates at 075-GRM bilaterally for the fusion by removing the soft tissues with the curettes. We then used the trial spacers to pick the appropriate sized interbody prosthesis. We prefilled his prosthesis with a combination of local morselized autograft bone that we obtained during the decompression as well as Kinnex bone graft extender. We inserted the prefilled prosthesis into the interspace  at L4-5 from the left. I couldn't  get the prosthesis completely turned. We expanded the prosthesis. There was a good snug fit of the prosthesis in the interspace. The expanding cap was not touching the exiting L4 nerve root on the left. We then filled and the remainder of the intervertebral disc space with local morselized autograft bone and Kinnex. This completed the posterior lumbar interbody arthrodesis.  We now turned attention to the instrumentation. Under fluoroscopic guidance we cannulated the bilateral L4 and L5 pedicles with the bone probe. We then removed the bone probe. We then tapped the pedicle with a 5.5 millimeter tap. We then removed the tap. We probed inside the tapped pedicle with a ball probe to rule out cortical breaches. We then inserted a 6.5 x 45 and 50 millimeter pedicle screw into the L4 and L5 pedicles bilaterally under fluoroscopic guidance. We then palpated along the medial aspect of the pedicles to rule out cortical breaches. There were none. The nerve roots were not injured. We then connected the unilateral pedicle screws with a lordotic rod. We compressed the construct and secured the rod in place with the caps. We then tightened the caps appropriately. This completed the instrumentation from L4-5.  We now turned our attention to the posterior lateral arthrodesis at L4-5. We used the high-speed drill to decorticate the remainder of the facets, pars, transverse process at L4-5. We then applied a combination of local morselized autograft bone and Kinnex bone graft extender over these decorticated posterior lateral structures. This completed the posterior lateral arthrodesis.  We then obtained hemostasis using bipolar electrocautery. We irrigated the wound out with bacitracin solution. We inspected the thecal sac and nerve roots and noted they were well decompressed. We then removed the retractor. I placed vancomycin powder in the wound We reapproximated patient's thoracolumbar  fascia with interrupted #1 Vicryl suture. We reapproximated patient's subcutaneous tissue with interrupted 2-0 Vicryl suture. The reapproximated patient's skin with Steri-Strips and benzoin. The wound was then coated with bacitracin ointment. A sterile dressing was applied. The drapes were removed. The patient was subsequently returned to the supine position where they were extubated by the anesthesia team. He was then transported to the post anesthesia care unit in stable condition. All sponge instrument and needle counts were reportedly correct at the end of this case.

## 2015-07-29 NOTE — Transfer of Care (Signed)
Immediate Anesthesia Transfer of Care Note  Patient: Nourah Caulkins Burkey  Procedure(s) Performed: Procedure(s) with comments: POSTERIOR LUMBAR FUSION 1 LEVEL (N/A) - Lumbar Four/Five posterior lumbar interbody fusion with interbody prosthesis posterior lateral arthrodesis and posterior nonsegmental instrumentation  Patient Location: PACU  Anesthesia Type:General  Level of Consciousness: awake, alert , oriented and patient cooperative  Airway & Oxygen Therapy: Patient Spontanous Breathing and Patient connected to nasal cannula oxygen  Post-op Assessment: Report given to RN and Post -op Vital signs reviewed and stable  Post vital signs: Reviewed and stable  Last Vitals:  Filed Vitals:   07/29/15 0647  BP: 113/62  Pulse: 87  Temp: 36.6 C  Resp: 20    Complications: No apparent anesthesia complications

## 2015-07-29 NOTE — Anesthesia Postprocedure Evaluation (Signed)
Anesthesia Post Note  Patient: Courtney [name]  Procedure(s) Performed: Procedure(s) (LRB): POSTERIOR LUMBAR FUSION 1 LEVEL (N/A)  Patient location during evaluation: PACU Anesthesia Type: General Level of consciousness: awake and alert Pain management: pain level controlled Vital Signs Assessment: post-procedure vital signs reviewed and stable Respiratory status: spontaneous breathing, nonlabored ventilation, respiratory function stable and patient connected to nasal cannula oxygen Cardiovascular status: blood pressure returned to baseline and stable Postop Assessment: no signs of nausea or vomiting Anesthetic complications: no    Last Vitals:  Filed Vitals:   07/29/15 0647  BP: 113/62  Pulse: 87  Temp: 36.6 C  Resp: 20    Last Pain:  Filed Vitals:   07/29/15 0731  PainSc: 5                  Jamilah Jean L

## 2015-07-29 NOTE — Progress Notes (Signed)
Subjective:  The patient is alert. She complains of back pain.  Objective: Vital signs in last 24 hours: Temp:  [97.9 F (36.6 C)] 97.9 F (36.6 C) (02/08 0647) Pulse Rate:  [87] 87 (02/08 0647) Resp:  [20] 20 (02/08 0647) BP: (113)/(62) 113/62 mmHg (02/08 0647) SpO2:  [97 %] 97 % (02/08 0647) Weight:  [79.833 kg (176 lb)] 79.833 kg (176 lb) (02/08 0647)  Intake/Output from previous day:   Intake/Output this shift: Total I/O In: 2000 [I.V.:2000] Out: 345 [Urine:195; Blood:150]  Physical exam the patient is alert. She is moving her lower extremities well. Her dressing is clean and dry.  Lab Results: No results for input(s): WBC, HGB, HCT, PLT in the last 72 hours. BMET No results for input(s): NA, K, CL, CO2, GLUCOSE, BUN, CREATININE, CALCIUM in the last 72 hours.  Studies/Results: Dg Lumbar Spine 2-3 Views  07/29/2015  CLINICAL DATA:  L4-5 PLIF EXAM: LUMBAR SPINE - 2-3 VIEW; DG C-ARM 61-120 MIN COMPARISON:  02/12/2015 FLUOROSCOPY TIME:  Radiation Exposure Index (as provided by the fluoroscopic device): Not available If the device does not provide the exposure index: Fluoroscopy Time:  27 seconds Number of Acquired Images:  2 FINDINGS: Pedicle screws are noted at L4 and L5 with interbody fusion. IMPRESSION: L4-5 PLIF Electronically Signed   By: Inez Catalina M.D.   On: 07/29/2015 12:11   Dg C-arm 1-60 Min  07/29/2015  CLINICAL DATA:  L4-5 PLIF EXAM: LUMBAR SPINE - 2-3 VIEW; DG C-ARM 61-120 MIN COMPARISON:  02/12/2015 FLUOROSCOPY TIME:  Radiation Exposure Index (as provided by the fluoroscopic device): Not available If the device does not provide the exposure index: Fluoroscopy Time:  27 seconds Number of Acquired Images:  2 FINDINGS: Pedicle screws are noted at L4 and L5 with interbody fusion. IMPRESSION: L4-5 PLIF Electronically Signed   By: Inez Catalina M.D.   On: 07/29/2015 12:11    Assessment/Plan: The patient is doing well.  LOS: 0 days     Zebadiah Willert D 07/29/2015,  12:53 PM

## 2015-07-30 LAB — CBC
HEMATOCRIT: 29.6 % — AB (ref 36.0–46.0)
HEMOGLOBIN: 9.6 g/dL — AB (ref 12.0–15.0)
MCH: 29.8 pg (ref 26.0–34.0)
MCHC: 32.4 g/dL (ref 30.0–36.0)
MCV: 91.9 fL (ref 78.0–100.0)
Platelets: 186 10*3/uL (ref 150–400)
RBC: 3.22 MIL/uL — ABNORMAL LOW (ref 3.87–5.11)
RDW: 12.9 % (ref 11.5–15.5)
WBC: 7.1 10*3/uL (ref 4.0–10.5)

## 2015-07-30 LAB — BASIC METABOLIC PANEL
Anion gap: 9 (ref 5–15)
BUN: 12 mg/dL (ref 6–20)
CHLORIDE: 104 mmol/L (ref 101–111)
CO2: 26 mmol/L (ref 22–32)
CREATININE: 0.86 mg/dL (ref 0.44–1.00)
Calcium: 8.8 mg/dL — ABNORMAL LOW (ref 8.9–10.3)
GFR calc non Af Amer: >60 mL/min (ref >60)
GLUCOSE: 170 mg/dL — AB (ref 65–99)
Potassium: 3.8 mmol/L (ref 3.5–5.1)
Sodium: 139 mmol/L (ref 135–145)

## 2015-07-30 NOTE — Progress Notes (Signed)
Orthopedic Tech Progress Note Patient Details:  [name] 1946/01/12 [id number]  Patient ID: Courtney [name], female   DOB: [birth year], 69 y.o.   MRN: [id number] Called in bio-tech brace order; spoke with [name], [name] 07/30/2015, 9:25 AM

## 2015-07-30 NOTE — Progress Notes (Signed)
Pt has brace and has been educated about how to use it. Pt occasionally gets up to ambulate to bathroom without brace on. Bed alarm on.

## 2015-07-30 NOTE — Care Management Note (Signed)
Case Management Note  Patient Details  Name: Courtney [name] MRN: [id number] Date of Birth: 1946/03/27  Subjective/Objective:                    Action/Plan: Patient was admitted for a POSTERIOR LUMBAR FUSION 1 LEVEL.  Lives at home with spouse. Will follow for discharge needs pending PT/OT evals and physician orders.  Expected Discharge Date:                  Expected Discharge Plan:     In-House Referral:     Discharge planning Services     Post Acute Care Choice:    Choice offered to:     DME Arranged:    DME Agency:     HH Arranged:    HH Agency:     Status of Service:  In process, will continue to follow  Medicare Important Message Given:    Date Medicare IM Given:    Medicare IM give by:    Date Additional Medicare IM Given:    Additional Medicare Important Message give by:     If discussed at [place] of Stay Meetings, dates discussed:    Additional CommentsRolm Baptise, RN 07/30/2015, 12:06 PM [phone]

## 2015-07-30 NOTE — Progress Notes (Signed)
Brace has arrived.

## 2015-07-30 NOTE — Progress Notes (Signed)
PT Cancellation Note  Patient Details Name: Courtney [name] MRN: [id number] DOB: 11-25-1945   Cancelled Treatment:    Reason Eval/Treat Not Completed: Other (comment), brace has not arrived, will mobilize once delivered. PT to follow as able.     Cassell Clement, PT, CSCS Pager [phone] Office [phone]  07/30/2015, 10:19 AM

## 2015-07-30 NOTE — Evaluation (Addendum)
Physical Therapy Evaluation Patient Details Name: Courtney [name] MRN: [id number] DOB: Apr 08, 1946 Today's Date: 07/30/2015   History of Present Illness  70 y.o. female now s/p L4-5 PLIF on 07/29/15. PMH: hypertension, anemia.   Clinical Impression  Patient is s/p above surgery resulting in the deficits listed below (see PT Problem List).  Patient will benefit from skilled PT to increase their independence and safety with mobility (while adhering to their precautions) to allow discharge to the venue listed below. Patient able to ambulate 220 feet with rw and min guard assist. Requiring physical and verbal assistance with bed mobility with initial session. PT to continue to follow and advance mobility in anticipation of D/C to home with family assistance. Patient's sister present throughout session and will be the primary caregiver.      Follow Up Recommendations No PT follow up;Supervision for mobility/OOB    Equipment Recommendations  None recommended by PT;Other (comment) (Patient reports having rw at home)    Recommendations for Other Services OT consult     Precautions / Restrictions Precautions Precautions: Back;Fall Precaution Comments: Reviewed back precautions with patient and family.  Required Braces or Orthoses: Spinal Brace Spinal Brace: Applied in sitting position Restrictions Weight Bearing Restrictions: No      Mobility  Bed Mobility Overal bed mobility: Needs Assistance Bed Mobility: Rolling;Sidelying to Sit Rolling: Min assist Sidelying to sit: Mod assist (at trunk, heavy cues for logroll. )       General bed mobility comments: Stressing logroll technique with patient and family.   Transfers Overall transfer level: Needs assistance Equipment used: Rolling walker (2 wheeled) Transfers: Sit to/from Stand Sit to Stand: Min guard         General transfer comment: cues for hand and neutral spine.   Ambulation/Gait Ambulation/Gait assistance: Min  guard Ambulation Distance (Feet): 220 Feet Assistive device: Rolling walker (2 wheeled) Gait Pattern/deviations: Step-through pattern Gait velocity: decreased   General Gait Details: steady pattern, no loss of balance. Using rw for additional stability.   Stairs            Wheelchair Mobility    Modified Rankin (Stroke Patients Only)       Balance Overall balance assessment: Needs assistance Sitting-balance support: No upper extremity supported;Feet supported Sitting balance-Leahy Scale: Good     Standing balance support: No upper extremity supported Standing balance-Leahy Scale: Fair Standing balance comment: static, donning/doffing robe                             Pertinent Vitals/Pain Pain Assessment: 0-10 Pain Score: 7  Pain Location: back Pain Descriptors / Indicators: Sore Pain Intervention(s): Limited activity within patient's tolerance;Monitored during session    Home Living Family/patient expects to be discharged to:: Private residence Living Arrangements: Spouse/significant other Available Help at Discharge: Family;Available 24 hours/day (spouse and sister to assist) Type of Home: House Home Access: Level entry     Home Layout: One level Home Equipment: Walker - 2 wheels;Cane - single point      Prior Function Level of Independence: Independent               Hand Dominance        Extremity/Trunk Assessment               Lower Extremity Assessment: Overall WFL for tasks assessed         Communication   Communication: No difficulties  Cognition Arousal/Alertness: Awake/alert Behavior During Therapy: [facility]  for tasks assessed/performed Overall Cognitive Status: Within Functional Limits for tasks assessed                      General Comments      Exercises        Assessment/Plan    PT Assessment Patient needs continued PT services  PT Diagnosis Difficulty walking   PT Problem List Decreased  strength;Decreased activity tolerance;Decreased balance;Decreased mobility;Decreased knowledge of use of DME;Decreased knowledge of precautions  PT Treatment Interventions DME instruction;Gait training;Functional mobility training;Stair training;Therapeutic activities;Therapeutic exercise;Balance training;Patient/family education   PT Goals (Current goals can be found in the Care Plan section) Acute Rehab PT Goals Patient Stated Goal: go home PT Goal Formulation: With patient Time For Goal Achievement: 08/13/15 Potential to Achieve Goals: Good    Frequency Min 5X/week   Barriers to discharge        Co-evaluation               End of Session Equipment Utilized During Treatment: Gait belt;Back brace Activity Tolerance: Patient tolerated treatment well Patient left: in chair;with call bell/phone within reach;with chair alarm set;with family/visitor present Nurse Communication: Mobility status    Functional Assessment Tool Used: clinical judgment Functional Limitation: Mobility: Walking and moving around Mobility: Walking and Moving Around Current Status [phone]): At least 20 percent but less than 40 percent impaired, limited or restricted Mobility: Walking and Moving Around Goal Status [phone]): At least 1 percent but less than 20 percent impaired, limited or restricted    Time: [id number] PT Time Calculation (min) (ACUTE ONLY): 38 min   Charges:   PT Evaluation $PT Eval Moderate Complexity: 1 Procedure PT Treatments $Gait Training: 8-22 mins $Therapeutic Activity: 8-22 mins   PT G Codes:   PT G-Codes **NOT FOR INPATIENT CLASS** Functional Assessment Tool Used: clinical judgment Functional Limitation: Mobility: Walking and moving around Mobility: Walking and Moving Around Current Status [id number]): At least 20 percent but less than 40 percent impaired, limited or restricted Mobility: Walking and Moving Around Goal Status [phone]): At least 1 percent but less than 20 percent  impaired, limited or restricted    Cassell Clement, PT, [place] Pager [phone] Office [phone]  07/30/2015, 4:03 PM

## 2015-07-30 NOTE — Progress Notes (Signed)
Patient ID: Courtney [name], female   DOB: March 31, 1946, 70 y.o.   MRN: [id number] Subjective:  The patient is alert and pleasant. She looks well. She is appropriately sore.  Objective: Vital signs in last 24 hours: Temp:  [98 F (36.7 C)-98.3 F (36.8 C)] 98.1 F (36.7 C) (02/09 1334) Pulse Rate:  [70-113] 89 (02/09 1334) Resp:  [18-20] 20 (02/09 1334) BP: (102-134)/(44-75) 105/52 mmHg (02/09 1334) SpO2:  [94 %-100 %] 95 % (02/09 1334)  Intake/Output from previous day: 02/08 0701 - 02/09 0700 In: 2000 [I.V.:2000] Out: 345 [Urine:195; Blood:150] Intake/Output this shift: Total I/O In: 720 [P.O.:720] Out: -   Physical exam the patient is alert and pleasant. She is moving her lower extremities well.  Lab Results:  Recent Labs  07/30/15 0937  WBC 7.1  HGB 9.6*  HCT 29.6*  PLT 186   BMET  Recent Labs  07/30/15 0937  NA 139  K 3.8  CL 104  CO2 26  GLUCOSE 170*  BUN 12  CREATININE 0.86  CALCIUM 8.8*    Studies/Results: Dg Lumbar Spine 2-3 Views  07/29/2015  CLINICAL DATA:  L4-5 PLIF EXAM: LUMBAR SPINE - 2-3 VIEW; DG C-ARM 61-120 MIN COMPARISON:  02/12/2015 FLUOROSCOPY TIME:  Radiation Exposure Index (as provided by the fluoroscopic device): Not available If the device does not provide the exposure index: Fluoroscopy Time:  27 seconds Number of Acquired Images:  2 FINDINGS: Pedicle screws are noted at L4 and L5 with interbody fusion. IMPRESSION: L4-5 PLIF Electronically Signed   By: Inez Catalina M.D.   On: 07/29/2015 12:11   Dg Lumbar Spine 1 View  07/29/2015  CLINICAL DATA:  70 year old female undergoing lumbar spine surgery. Initial encounter. EXAM: LUMBAR SPINE - 1 VIEW COMPARISON:  [place] [facility] lumbar radiographs 02/12/2015. Lumbar MRI 12/09/2014. FINDINGS: Normal lumbar segmentation demonstrated on 02/12/2015 which is the same numbering system on the prior MRI. Intraoperative portable cross-table lateral view of the lumbar spine labeled #1 at 0918 hours.  Chronic anterolisthesis re-demonstrated at L4-L5. Surgical probe directed toward the L4-L5 disc space level, projecting over the facets. IMPRESSION: Intraoperative localization at L4-L5. Electronically Signed   By: Genevie Ann M.D.   On: 07/29/2015 15:17   Dg C-arm 1-60 Min  07/29/2015  CLINICAL DATA:  L4-5 PLIF EXAM: LUMBAR SPINE - 2-3 VIEW; DG C-ARM 61-120 MIN COMPARISON:  02/12/2015 FLUOROSCOPY TIME:  Radiation Exposure Index (as provided by the fluoroscopic device): Not available If the device does not provide the exposure index: Fluoroscopy Time:  27 seconds Number of Acquired Images:  2 FINDINGS: Pedicle screws are noted at L4 and L5 with interbody fusion. IMPRESSION: L4-5 PLIF Electronically Signed   By: Inez Catalina M.D.   On: 07/29/2015 12:11    Assessment/Plan: The patient is doing well. She may go home tomorrow.  LOS: 1 day     Courtney [name] D 07/30/2015, 1:48 PM

## 2015-07-30 NOTE — Progress Notes (Signed)
Hourly rounding performed. Call light within reach. Pt in no acute distress. Denies needs.  Family at bedside.

## 2015-07-31 MED ORDER — HYDROMORPHONE HCL 2 MG PO TABS
1.0000 mg | ORAL_TABLET | Freq: Four times a day (QID) | ORAL | Status: DC | PRN
  Administered 2015-07-31 – 2015-08-01 (×4): 2 mg via ORAL
  Filled 2015-07-31 (×4): qty 1

## 2015-07-31 NOTE — Progress Notes (Signed)
Physical Therapy Treatment and Discharge Patient Details Name: Courtney [name] MRN: [id number] DOB: 08/25/1945 Today's Date: 07/31/2015    History of Present Illness 70 y.o. female now s/p L4-5 PLIF on 07/29/15. PMH: hypertension, anemia.     PT Comments    Patient mobilizing well, knows her back precautions, and dons her brace independently. No further acute PT needs identified. Discontinue PT  Follow Up Recommendations  No PT follow up;Supervision for mobility/OOB     Equipment Recommendations  None recommended by PT    Recommendations for Other Services OT consult     Precautions / Restrictions Precautions Precautions: Back;Fall Precaution Booklet Issued: Yes (comment) Precaution Comments: Pt able to state 3/3; only required one cue during entire session (when rolling) Required Braces or Orthoses: Spinal Brace Spinal Brace: Applied in sitting position;Lumbar corset Restrictions Weight Bearing Restrictions: No    Mobility  Bed Mobility Overal bed mobility: Needs Assistance Bed Mobility: Rolling;Sidelying to Sit Rolling: Supervision (no rail) Sidelying to sit: Min guard (HOB 0)       General bed mobility comments: Stressing logroll technique with patient and family.   Transfers Overall transfer level: Needs assistance Equipment used: Rolling walker (2 wheeled) Transfers: Sit to/from Stand Sit to Stand: Min guard         General transfer comment: for safety  Ambulation/Gait Ambulation/Gait assistance: Supervision Ambulation Distance (Feet): 300 Feet Assistive device: Rolling walker (2 wheeled) Gait Pattern/deviations: Step-through pattern;Decreased stride length Gait velocity: decreased   General Gait Details: steady pattern, no loss of balance. Using rw for additional stability due to grogginess from pain medicine   Stairs            Wheelchair Mobility    Modified Rankin (Stroke Patients Only)       Balance     Sitting balance-Leahy  Scale: Good       Standing balance-Leahy Scale: Fair                      Cognition Arousal/Alertness: Awake/alert Behavior During Therapy: WFL for tasks assessed/performed Overall Cognitive Status: Within Functional Limits for tasks assessed                      Exercises      General Comments        Pertinent Vitals/Pain Pain Assessment: Faces Faces Pain Scale: Hurts little more Pain Location: back Pain Descriptors / Indicators: Operative site guarding Pain Intervention(s): Limited activity within patient's tolerance;Monitored during session;Premedicated before session;Repositioned    Home Living                      Prior Function            PT Goals (current goals can now be found in the care plan section) Acute Rehab PT Goals Patient Stated Goal: go home PT Goal Formulation: With patient Time For Goal Achievement: 08/13/15 Potential to Achieve Goals: Good Progress towards PT goals: Goals met/education completed, patient discharged from PT    Frequency       PT Plan Current plan remains appropriate    Co-evaluation             End of Session Equipment Utilized During Treatment: Gait belt;Back brace Activity Tolerance: Patient tolerated treatment well Patient left: in chair;with call bell/phone within reach;with chair alarm set   Patient is being discharged from PT services secondary to:  Goals met and no further therapy needs identified.     Please see latest Therapy Progress Note for current level of functioning and progress toward goals.  Progress and discharge plan and discussed with patient/caregiver and they  Agree    Time: 0881-1031 PT Time Calculation (min) (ACUTE ONLY): 26 min  Charges:  $Gait Training: 23-37 mins                    G Codes:      Courtney [name] August 12, 2015, 9:08 AM Pager [phone]

## 2015-07-31 NOTE — Care Management Important Message (Signed)
Important Message  Patient Details  Name: Courtney [name] MRN: [id number] Date of Birth: May 17, 1946   Medicare Important Message Given:  Yes    Barb Merino [name] 07/31/2015, 3:03 PM

## 2015-07-31 NOTE — Progress Notes (Signed)
Patient ID: Courtney [name], female   DOB: 01/24/1946, 70 y.o.   MRN: [id number] Subjective:  The patient is alert and pleasant. She is more sore today she wants to go home tomorrow.  Objective: Vital signs in last 24 hours: Temp:  [98 F (36.7 C)-99.4 F (37.4 C)] 99.4 F (37.4 C) (02/10 0200) Pulse Rate:  [89-107] 107 (02/10 0200) Resp:  [18-20] 18 (02/10 0200) BP: (96-123)/(51-65) 123/55 mmHg (02/10 0200) SpO2:  [94 %-96 %] 94 % (02/10 0200)  Intake/Output from previous day: 02/09 0701 - 02/10 0700 In: 720 [P.O.:720] Out: -  Intake/Output this shift:    Physical exam the patient is alert and pleasant. Her strength is grossly normal in her lower extremities.  Lab Results:  Recent Labs  07/30/15 0937  WBC 7.1  HGB 9.6*  HCT 29.6*  PLT 186   BMET  Recent Labs  07/30/15 0937  NA 139  K 3.8  CL 104  CO2 26  GLUCOSE 170*  BUN 12  CREATININE 0.86  CALCIUM 8.8*    Studies/Results: Dg Lumbar Spine 2-3 Views  07/29/2015  CLINICAL DATA:  L4-5 PLIF EXAM: LUMBAR SPINE - 2-3 VIEW; DG C-ARM 61-120 MIN COMPARISON:  02/12/2015 FLUOROSCOPY TIME:  Radiation Exposure Index (as provided by the fluoroscopic device): Not available If the device does not provide the exposure index: Fluoroscopy Time:  27 seconds Number of Acquired Images:  2 FINDINGS: Pedicle screws are noted at L4 and L5 with interbody fusion. IMPRESSION: L4-5 PLIF Electronically Signed   By: Inez Catalina M.D.   On: 07/29/2015 12:11   Dg Lumbar Spine 1 View  07/29/2015  CLINICAL DATA:  70 year old female undergoing lumbar spine surgery. Initial encounter. EXAM: LUMBAR SPINE - 1 VIEW COMPARISON:  [place] [facility] lumbar radiographs 02/12/2015. Lumbar MRI 12/09/2014. FINDINGS: Normal lumbar segmentation demonstrated on 02/12/2015 which is the same numbering system on the prior MRI. Intraoperative portable cross-table lateral view of the lumbar spine labeled #1 at 0918 hours. 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 Surgical probe directed toward the L4-L5 disc space level, projecting over the facets. IMPRESSION: Intraoperative localization at L4-L5. Electronically Signed   By: Genevie Ann M.D.   On: 07/29/2015 15:17   Dg C-arm 1-60 Min  07/29/2015  CLINICAL DATA:  L4-5 PLIF EXAM: LUMBAR SPINE - 2-3 VIEW; DG C-ARM 61-120 MIN COMPARISON:  02/12/2015 FLUOROSCOPY TIME:  Radiation Exposure Index (as provided by the fluoroscopic device): Not available If the device does not provide the exposure index: Fluoroscopy Time:  27 seconds Number of Acquired Images:  2 FINDINGS: Pedicle screws are noted at L4 and L5 with interbody fusion. IMPRESSION: L4-5 PLIF Electronically Signed   By: Inez Catalina M.D.   On: 07/29/2015 12:11    Assessment/Plan: Postop day #2: We will continue to mobilize the patient. She will likely go home tomorrow. I gave her discharge instructions and answered all her questions.  LOS: 2 days     Shawnta Zimbelman D 07/31/2015, 7:44 AM

## 2015-08-01 MED ORDER — HYDROMORPHONE HCL 2 MG PO TABS: 1.0000 mg | ORAL_TABLET | Freq: Four times a day (QID) | ORAL | Status: DC | PRN

## 2015-08-01 MED ORDER — DIAZEPAM 5 MG PO TABS: 5.0000 mg | ORAL_TABLET | Freq: Four times a day (QID) | ORAL | Status: DC | PRN

## 2015-08-01 NOTE — Progress Notes (Signed)
Patient is observed this morning walking down the hall with her walker. Approached patient and walked down the rest of the hall with her and back to her room. When asked about her pain, she states that her pain was relieved from the last dose of pain medicine that she received(see mar),-->she is encouraged to call if/when she needs something for pain. She states that she hopes to go home today. Spouse at bedside. Will continue to observe.

## 2015-08-01 NOTE — Discharge Summary (Signed)
Physician Discharge Summary  Patient ID: Courtney [name] MRN: [id number] DOB/AGE: 02/14/1946 70 y.o.  Admit date: 07/29/2015 Discharge date: 08/01/2015  Admission Diagnoses: stenosis with instability   Discharge Diagnoses: same   Discharged Condition: good  Hospital Course: The patient was admitted on 07/29/2015 and taken to the operating room where the patient underwent PLIF. The patient tolerated the procedure well and was taken to the recovery room and then to the floor in stable condition. The hospital course was routine. There were no complications. The wound remained clean dry and intact. Pt had appropriate back soreness. No complaints of leg pain or new N/T/W. The patient remained afebrile with stable vital signs, and tolerated a regular diet. The patient continued to increase activities, and pain was well controlled with oral pain medications.   Consults: None  Significant Diagnostic Studies:  Results for orders placed or performed during the hospital encounter of 07/29/15  CBC  Result Value Ref Range   WBC 7.1 4.0 - 10.5 K/uL   RBC 3.22 (L) 3.87 - 5.11 MIL/uL   Hemoglobin 9.6 (L) 12.0 - 15.0 g/dL   HCT 29.6 (L) 36.0 - 46.0 %   MCV 91.9 78.0 - 100.0 fL   MCH 29.8 26.0 - 34.0 pg   MCHC 32.4 30.0 - 36.0 g/dL   RDW 12.9 11.5 - 15.5 %   Platelets 186 150 - 400 K/uL  Basic Metabolic Panel  Result Value Ref Range   Sodium 139 135 - 145 mmol/L   Potassium 3.8 3.5 - 5.1 mmol/L   Chloride 104 101 - 111 mmol/L   CO2 26 22 - 32 mmol/L   Glucose, Bld 170 (H) 65 - 99 mg/dL   BUN 12 6 - 20 mg/dL   Creatinine, Ser 0.86 0.44 - 1.00 mg/dL   Calcium 8.8 (L) 8.9 - 10.3 mg/dL   GFR calc non Af Amer >60 >60 mL/min   GFR calc Af Amer >60 >60 mL/min   Anion gap 9 5 - 15    Dg Lumbar Spine 2-3 Views  07/29/2015  CLINICAL DATA:  L4-5 PLIF EXAM: LUMBAR SPINE - 2-3 VIEW; DG C-ARM 61-120 MIN COMPARISON:  02/12/2015 FLUOROSCOPY TIME:  Radiation Exposure Index (as provided by the fluoroscopic  device): Not available If the device does not provide the exposure index: Fluoroscopy Time:  27 seconds Number of Acquired Images:  2 FINDINGS: Pedicle screws are noted at L4 and L5 with interbody fusion. IMPRESSION: L4-5 PLIF Electronically Signed   By: Inez Catalina M.D.   On: 07/29/2015 12:11   Dg Lumbar Spine 1 View  07/29/2015  CLINICAL DATA:  70 year old female undergoing lumbar spine surgery. Initial encounter. EXAM: LUMBAR SPINE - 1 VIEW COMPARISON:  [place] [facility] lumbar radiographs 02/12/2015. Lumbar MRI 12/09/2014. FINDINGS: Normal lumbar segmentation demonstrated on 02/12/2015 which is the same numbering system on the prior MRI. Intraoperative portable cross-table lateral view of the lumbar spine labeled #1 at 0918 hours. Chronic anterolisthesis re-demonstrated at L4-L5. Surgical probe directed toward the L4-L5 disc space level, projecting over the facets. IMPRESSION: Intraoperative localization at L4-L5. Electronically Signed   By: Genevie Ann M.D.   On: 07/29/2015 15:17   Dg C-arm 1-60 Min  07/29/2015  CLINICAL DATA:  L4-5 PLIF EXAM: LUMBAR SPINE - 2-3 VIEW; DG C-ARM 61-120 MIN COMPARISON:  02/12/2015 FLUOROSCOPY TIME:  Radiation Exposure Index (as provided by the fluoroscopic device): Not available If the device does not provide the exposure index: Fluoroscopy Time:  27 seconds Number of Acquired Images:  2 FINDINGS: Pedicle screws are noted at L4 and L5 with interbody fusion. IMPRESSION: L4-5 PLIF Electronically Signed   By: Inez Catalina M.D.   On: 07/29/2015 12:11    Antibiotics:  Anti-infectives    Start     Dose/Rate Route Frequency Ordered Stop   07/29/15 2000  ceFAZolin (ANCEF) IVPB 2 g/50 mL premix     2 g 100 mL/hr over 30 Minutes Intravenous Every 8 hours 07/29/15 1445 07/30/15 0518   07/29/15 0941  vancomycin (VANCOCIN) powder  Status:  Discontinued       As needed 07/29/15 0942 07/29/15 1234   07/29/15 0938  vancomycin (VANCOCIN) 1000 MG powder    Comments:  Latricia Heft   : cabinet override      07/29/15 0938 07/29/15 2144   07/29/15 0929  bacitracin 50,000 Units in sodium chloride irrigation 0.9 % 500 mL irrigation  Status:  Discontinued       As needed 07/29/15 0929 07/29/15 1234   07/29/15 0800  ceFAZolin (ANCEF) IVPB 2 g/50 mL premix     2 g 100 mL/hr over 30 Minutes Intravenous To [facility] 07/28/15 1210 07/29/15 1205      Discharge Exam: Blood pressure 125/61, pulse 96, temperature 98.3 F (36.8 C), temperature source Oral, resp. rate 20, weight 79.833 kg (176 lb), SpO2 96 %. Neurologic: Grossly normal Dressing dry  Discharge Medications:     Medication List    STOP taking these medications        HYDROcodone-acetaminophen 7.5-325 MG tablet  Commonly known as:  NORCO      TAKE these medications        CENTRUM SILVER ADULT 50+ PO  Take 1 tablet by mouth daily.     diazepam 5 MG tablet  Commonly known as:  VALIUM  Take 1 tablet (5 mg total) by mouth every 6 (six) hours as needed for muscle spasms.     furosemide 20 MG tablet  Commonly known as:  LASIX  Take 20 mg by mouth daily as needed for fluid.     HYDROmorphone 2 MG tablet  Commonly known as:  DILAUDID  Take 0.5-1 tablets (1-2 mg total) by mouth every 6 (six) hours as needed for severe pain.     metoprolol tartrate 25 MG tablet  Commonly known as:  LOPRESSOR  Take 25 mg by mouth 2 (two) times daily.     potassium chloride 10 MEQ tablet  Commonly known as:  K-DUR,KLOR-CON  Take 10 mEq by mouth daily as needed (when taking lasix).     pravastatin 20 MG tablet  Commonly known as:  PRAVACHOL  Take 20 mg by mouth daily.     PROAIR HFA 108 (90 Base) MCG/ACT inhaler  Generic drug:  albuterol  Inhale 2 puffs into the lungs as needed for wheezing or shortness of breath.     vitamin B-12 1000 MCG tablet  Commonly known as:  CYANOCOBALAMIN  Inject 1,000 mcg as directed every 30 (thirty) days. Reported on 06/10/2015        Disposition: home   Final Dx:  PLIF      Discharge Instructions    Call MD for:  difficulty breathing, headache or visual disturbances    Complete by:  As directed      Call MD for:  persistant nausea and vomiting    Complete by:  As directed      Call MD for:  redness, tenderness, or signs of infection (pain, swelling, redness, odor  or green/yellow discharge around incision site)    Complete by:  As directed      Call MD for:  severe uncontrolled pain    Complete by:  As directed      Call MD for:  temperature >100.4    Complete by:  As directed      Diet - low sodium heart healthy    Complete by:  As directed      Discharge instructions    Complete by:  As directed   No bending or twisting, no heavy lifting, no driving, may shower     Increase activity slowly    Complete by:  As directed               Signed: Townes Fuhs S 08/01/2015, 9:26 AM

## 2015-08-01 NOTE — Progress Notes (Signed)
D/c instructions given to patient and spouse. Scripts given to patient. Teaching completed-patient states understanding. Patient is assisted to vehicle by staff member.

## 2015-08-25 DIAGNOSIS — M4316 Spondylolisthesis, lumbar region: Secondary | ICD-10-CM | POA: Diagnosis not present

## 2015-09-28 DIAGNOSIS — M129 Arthropathy, unspecified: Secondary | ICD-10-CM | POA: Diagnosis not present

## 2015-09-28 DIAGNOSIS — M5137 Other intervertebral disc degeneration, lumbosacral region: Secondary | ICD-10-CM | POA: Diagnosis not present

## 2015-09-28 DIAGNOSIS — Z6826 Body mass index (BMI) 26.0-26.9, adult: Secondary | ICD-10-CM | POA: Diagnosis not present

## 2015-09-28 DIAGNOSIS — M4806 Spinal stenosis, lumbar region: Secondary | ICD-10-CM | POA: Diagnosis not present

## 2015-09-28 DIAGNOSIS — M533 Sacrococcygeal disorders, not elsewhere classified: Secondary | ICD-10-CM | POA: Diagnosis not present

## 2015-10-14 DIAGNOSIS — N390 Urinary tract infection, site not specified: Secondary | ICD-10-CM | POA: Diagnosis not present

## 2015-10-14 DIAGNOSIS — Z1389 Encounter for screening for other disorder: Secondary | ICD-10-CM | POA: Diagnosis not present

## 2015-10-14 DIAGNOSIS — D510 Vitamin B12 deficiency anemia due to intrinsic factor deficiency: Secondary | ICD-10-CM | POA: Diagnosis not present

## 2015-10-14 DIAGNOSIS — Z0000 Encounter for general adult medical examination without abnormal findings: Secondary | ICD-10-CM | POA: Diagnosis not present

## 2015-10-30 DIAGNOSIS — M533 Sacrococcygeal disorders, not elsewhere classified: Secondary | ICD-10-CM | POA: Diagnosis not present

## 2015-12-01 DIAGNOSIS — R5381 Other malaise: Secondary | ICD-10-CM | POA: Diagnosis not present

## 2015-12-01 DIAGNOSIS — R300 Dysuria: Secondary | ICD-10-CM | POA: Diagnosis not present

## 2015-12-01 DIAGNOSIS — Z79899 Other long term (current) drug therapy: Secondary | ICD-10-CM | POA: Diagnosis not present

## 2015-12-01 DIAGNOSIS — D519 Vitamin B12 deficiency anemia, unspecified: Secondary | ICD-10-CM | POA: Diagnosis not present

## 2015-12-01 DIAGNOSIS — E785 Hyperlipidemia, unspecified: Secondary | ICD-10-CM | POA: Diagnosis not present

## 2015-12-01 DIAGNOSIS — R5383 Other fatigue: Secondary | ICD-10-CM | POA: Diagnosis not present

## 2015-12-04 DIAGNOSIS — M545 Low back pain: Secondary | ICD-10-CM | POA: Diagnosis not present

## 2015-12-04 DIAGNOSIS — Z6828 Body mass index (BMI) 28.0-28.9, adult: Secondary | ICD-10-CM | POA: Diagnosis not present

## 2015-12-15 DIAGNOSIS — J209 Acute bronchitis, unspecified: Secondary | ICD-10-CM | POA: Diagnosis not present

## 2015-12-17 DIAGNOSIS — R102 Pelvic and perineal pain: Secondary | ICD-10-CM | POA: Diagnosis not present

## 2015-12-17 DIAGNOSIS — F419 Anxiety disorder, unspecified: Secondary | ICD-10-CM | POA: Diagnosis not present

## 2015-12-29 DIAGNOSIS — E039 Hypothyroidism, unspecified: Secondary | ICD-10-CM | POA: Diagnosis not present

## 2015-12-29 DIAGNOSIS — I10 Essential (primary) hypertension: Secondary | ICD-10-CM | POA: Diagnosis not present

## 2016-01-04 DIAGNOSIS — M5137 Other intervertebral disc degeneration, lumbosacral region: Secondary | ICD-10-CM | POA: Diagnosis not present

## 2016-01-04 DIAGNOSIS — Z6827 Body mass index (BMI) 27.0-27.9, adult: Secondary | ICD-10-CM | POA: Diagnosis not present

## 2016-01-04 DIAGNOSIS — Z5181 Encounter for therapeutic drug level monitoring: Secondary | ICD-10-CM | POA: Diagnosis not present

## 2016-01-04 DIAGNOSIS — M533 Sacrococcygeal disorders, not elsewhere classified: Secondary | ICD-10-CM | POA: Diagnosis not present

## 2016-01-04 DIAGNOSIS — M4806 Spinal stenosis, lumbar region: Secondary | ICD-10-CM | POA: Diagnosis not present

## 2016-01-07 DIAGNOSIS — Z1231 Encounter for screening mammogram for malignant neoplasm of breast: Secondary | ICD-10-CM | POA: Diagnosis not present

## 2016-01-11 DIAGNOSIS — E039 Hypothyroidism, unspecified: Secondary | ICD-10-CM | POA: Diagnosis not present

## 2016-01-11 DIAGNOSIS — Z79899 Other long term (current) drug therapy: Secondary | ICD-10-CM | POA: Diagnosis not present

## 2016-01-11 DIAGNOSIS — R002 Palpitations: Secondary | ICD-10-CM | POA: Diagnosis not present

## 2016-01-11 DIAGNOSIS — E785 Hyperlipidemia, unspecified: Secondary | ICD-10-CM | POA: Diagnosis not present

## 2016-01-11 DIAGNOSIS — Z7982 Long term (current) use of aspirin: Secondary | ICD-10-CM | POA: Diagnosis not present

## 2016-01-11 DIAGNOSIS — I10 Essential (primary) hypertension: Secondary | ICD-10-CM | POA: Diagnosis not present

## 2016-02-02 DIAGNOSIS — N3091 Cystitis, unspecified with hematuria: Secondary | ICD-10-CM | POA: Diagnosis not present

## 2016-02-02 DIAGNOSIS — R300 Dysuria: Secondary | ICD-10-CM | POA: Diagnosis not present

## 2016-03-21 DIAGNOSIS — R300 Dysuria: Secondary | ICD-10-CM | POA: Diagnosis not present

## 2016-03-21 DIAGNOSIS — N3090 Cystitis, unspecified without hematuria: Secondary | ICD-10-CM | POA: Diagnosis not present

## 2016-03-23 ENCOUNTER — Encounter (HOSPITAL_COMMUNITY): Payer: Self-pay

## 2016-03-28 DIAGNOSIS — Z23 Encounter for immunization: Secondary | ICD-10-CM | POA: Diagnosis not present

## 2016-04-04 DIAGNOSIS — Z6827 Body mass index (BMI) 27.0-27.9, adult: Secondary | ICD-10-CM | POA: Diagnosis not present

## 2016-04-04 DIAGNOSIS — M5137 Other intervertebral disc degeneration, lumbosacral region: Secondary | ICD-10-CM | POA: Diagnosis not present

## 2016-04-04 DIAGNOSIS — E039 Hypothyroidism, unspecified: Secondary | ICD-10-CM | POA: Diagnosis not present

## 2016-04-04 DIAGNOSIS — N3090 Cystitis, unspecified without hematuria: Secondary | ICD-10-CM | POA: Diagnosis not present

## 2016-04-04 DIAGNOSIS — Z79899 Other long term (current) drug therapy: Secondary | ICD-10-CM | POA: Diagnosis not present

## 2016-04-04 DIAGNOSIS — M48061 Spinal stenosis, lumbar region without neurogenic claudication: Secondary | ICD-10-CM | POA: Diagnosis not present

## 2016-04-04 DIAGNOSIS — M533 Sacrococcygeal disorders, not elsewhere classified: Secondary | ICD-10-CM | POA: Diagnosis not present

## 2016-04-04 DIAGNOSIS — I10 Essential (primary) hypertension: Secondary | ICD-10-CM | POA: Diagnosis not present

## 2016-05-02 DIAGNOSIS — E785 Hyperlipidemia, unspecified: Secondary | ICD-10-CM | POA: Diagnosis not present

## 2016-05-02 DIAGNOSIS — R609 Edema, unspecified: Secondary | ICD-10-CM | POA: Diagnosis not present

## 2016-05-02 DIAGNOSIS — I10 Essential (primary) hypertension: Secondary | ICD-10-CM | POA: Diagnosis not present

## 2016-05-02 DIAGNOSIS — Z79899 Other long term (current) drug therapy: Secondary | ICD-10-CM | POA: Diagnosis not present

## 2016-05-02 DIAGNOSIS — Z7982 Long term (current) use of aspirin: Secondary | ICD-10-CM | POA: Diagnosis not present

## 2016-06-30 DIAGNOSIS — N390 Urinary tract infection, site not specified: Secondary | ICD-10-CM | POA: Diagnosis not present

## 2016-06-30 DIAGNOSIS — J111 Influenza due to unidentified influenza virus with other respiratory manifestations: Secondary | ICD-10-CM | POA: Diagnosis not present

## 2016-07-11 DIAGNOSIS — M5137 Other intervertebral disc degeneration, lumbosacral region: Secondary | ICD-10-CM | POA: Diagnosis not present

## 2016-07-11 DIAGNOSIS — M129 Arthropathy, unspecified: Secondary | ICD-10-CM | POA: Diagnosis not present

## 2016-07-11 DIAGNOSIS — M533 Sacrococcygeal disorders, not elsewhere classified: Secondary | ICD-10-CM | POA: Diagnosis not present

## 2016-07-11 DIAGNOSIS — M48061 Spinal stenosis, lumbar region without neurogenic claudication: Secondary | ICD-10-CM | POA: Diagnosis not present

## 2016-07-11 DIAGNOSIS — Z6827 Body mass index (BMI) 27.0-27.9, adult: Secondary | ICD-10-CM | POA: Diagnosis not present

## 2016-08-02 DIAGNOSIS — Z9181 History of falling: Secondary | ICD-10-CM | POA: Diagnosis not present

## 2016-08-02 DIAGNOSIS — J209 Acute bronchitis, unspecified: Secondary | ICD-10-CM | POA: Diagnosis not present

## 2016-08-02 DIAGNOSIS — R3915 Urgency of urination: Secondary | ICD-10-CM | POA: Diagnosis not present

## 2016-09-01 DIAGNOSIS — Z1211 Encounter for screening for malignant neoplasm of colon: Secondary | ICD-10-CM | POA: Diagnosis not present

## 2016-10-03 DIAGNOSIS — Z6827 Body mass index (BMI) 27.0-27.9, adult: Secondary | ICD-10-CM | POA: Diagnosis not present

## 2016-10-03 DIAGNOSIS — M5137 Other intervertebral disc degeneration, lumbosacral region: Secondary | ICD-10-CM | POA: Diagnosis not present

## 2016-10-03 DIAGNOSIS — M48061 Spinal stenosis, lumbar region without neurogenic claudication: Secondary | ICD-10-CM | POA: Diagnosis not present

## 2016-10-03 DIAGNOSIS — M533 Sacrococcygeal disorders, not elsewhere classified: Secondary | ICD-10-CM | POA: Diagnosis not present

## 2016-11-29 DIAGNOSIS — Z9181 History of falling: Secondary | ICD-10-CM | POA: Diagnosis not present

## 2016-11-29 DIAGNOSIS — Z6829 Body mass index (BMI) 29.0-29.9, adult: Secondary | ICD-10-CM | POA: Diagnosis not present

## 2016-11-29 DIAGNOSIS — R300 Dysuria: Secondary | ICD-10-CM | POA: Diagnosis not present

## 2016-11-29 DIAGNOSIS — Z1389 Encounter for screening for other disorder: Secondary | ICD-10-CM | POA: Diagnosis not present

## 2016-11-29 DIAGNOSIS — Z0000 Encounter for general adult medical examination without abnormal findings: Secondary | ICD-10-CM | POA: Diagnosis not present

## 2016-12-01 DIAGNOSIS — K648 Other hemorrhoids: Secondary | ICD-10-CM | POA: Diagnosis not present

## 2016-12-01 DIAGNOSIS — Z79899 Other long term (current) drug therapy: Secondary | ICD-10-CM | POA: Diagnosis not present

## 2016-12-01 DIAGNOSIS — I10 Essential (primary) hypertension: Secondary | ICD-10-CM | POA: Diagnosis not present

## 2016-12-01 DIAGNOSIS — Z1211 Encounter for screening for malignant neoplasm of colon: Secondary | ICD-10-CM | POA: Diagnosis not present

## 2016-12-01 DIAGNOSIS — E785 Hyperlipidemia, unspecified: Secondary | ICD-10-CM | POA: Diagnosis not present

## 2016-12-01 DIAGNOSIS — E039 Hypothyroidism, unspecified: Secondary | ICD-10-CM | POA: Diagnosis not present

## 2016-12-01 DIAGNOSIS — K5730 Diverticulosis of large intestine without perforation or abscess without bleeding: Secondary | ICD-10-CM | POA: Diagnosis not present

## 2016-12-01 HISTORY — PX: COLONOSCOPY: SHX5424

## 2016-12-01 HISTORY — PX: COLONOSCOPY: SHX174

## 2016-12-01 LAB — HM COLONOSCOPY

## 2016-12-08 DIAGNOSIS — N3000 Acute cystitis without hematuria: Secondary | ICD-10-CM | POA: Diagnosis not present

## 2016-12-30 DIAGNOSIS — G894 Chronic pain syndrome: Secondary | ICD-10-CM | POA: Diagnosis not present

## 2016-12-30 DIAGNOSIS — M5137 Other intervertebral disc degeneration, lumbosacral region: Secondary | ICD-10-CM | POA: Diagnosis not present

## 2016-12-30 DIAGNOSIS — M533 Sacrococcygeal disorders, not elsewhere classified: Secondary | ICD-10-CM | POA: Diagnosis not present

## 2016-12-30 DIAGNOSIS — Z6827 Body mass index (BMI) 27.0-27.9, adult: Secondary | ICD-10-CM | POA: Diagnosis not present

## 2016-12-30 DIAGNOSIS — M5416 Radiculopathy, lumbar region: Secondary | ICD-10-CM | POA: Diagnosis not present

## 2016-12-30 DIAGNOSIS — M48061 Spinal stenosis, lumbar region without neurogenic claudication: Secondary | ICD-10-CM | POA: Diagnosis not present

## 2016-12-30 DIAGNOSIS — Z5181 Encounter for therapeutic drug level monitoring: Secondary | ICD-10-CM | POA: Diagnosis not present

## 2017-01-17 DIAGNOSIS — K5900 Constipation, unspecified: Secondary | ICD-10-CM | POA: Diagnosis not present

## 2017-01-19 DIAGNOSIS — N952 Postmenopausal atrophic vaginitis: Secondary | ICD-10-CM | POA: Diagnosis not present

## 2017-01-19 DIAGNOSIS — N3020 Other chronic cystitis without hematuria: Secondary | ICD-10-CM | POA: Diagnosis not present

## 2017-02-16 DIAGNOSIS — R5383 Other fatigue: Secondary | ICD-10-CM | POA: Diagnosis not present

## 2017-02-16 DIAGNOSIS — E039 Hypothyroidism, unspecified: Secondary | ICD-10-CM | POA: Diagnosis not present

## 2017-02-16 DIAGNOSIS — Z1389 Encounter for screening for other disorder: Secondary | ICD-10-CM | POA: Diagnosis not present

## 2017-02-16 DIAGNOSIS — Z23 Encounter for immunization: Secondary | ICD-10-CM | POA: Diagnosis not present

## 2017-02-16 DIAGNOSIS — Z6829 Body mass index (BMI) 29.0-29.9, adult: Secondary | ICD-10-CM | POA: Diagnosis not present

## 2017-02-16 DIAGNOSIS — E7800 Pure hypercholesterolemia, unspecified: Secondary | ICD-10-CM | POA: Diagnosis not present

## 2017-03-28 DIAGNOSIS — R600 Localized edema: Secondary | ICD-10-CM | POA: Diagnosis not present

## 2017-03-28 DIAGNOSIS — E7800 Pure hypercholesterolemia, unspecified: Secondary | ICD-10-CM | POA: Diagnosis not present

## 2017-03-28 DIAGNOSIS — J00 Acute nasopharyngitis [common cold]: Secondary | ICD-10-CM | POA: Diagnosis not present

## 2017-03-28 DIAGNOSIS — E039 Hypothyroidism, unspecified: Secondary | ICD-10-CM | POA: Diagnosis not present

## 2017-04-07 DIAGNOSIS — M5137 Other intervertebral disc degeneration, lumbosacral region: Secondary | ICD-10-CM | POA: Diagnosis not present

## 2017-04-07 DIAGNOSIS — M533 Sacrococcygeal disorders, not elsewhere classified: Secondary | ICD-10-CM | POA: Diagnosis not present

## 2017-04-07 DIAGNOSIS — M48061 Spinal stenosis, lumbar region without neurogenic claudication: Secondary | ICD-10-CM | POA: Diagnosis not present

## 2017-04-11 DIAGNOSIS — Z23 Encounter for immunization: Secondary | ICD-10-CM | POA: Diagnosis not present

## 2017-04-24 ENCOUNTER — Encounter (HOSPITAL_COMMUNITY): Payer: Self-pay

## 2017-04-27 DIAGNOSIS — Z1231 Encounter for screening mammogram for malignant neoplasm of breast: Secondary | ICD-10-CM | POA: Diagnosis not present

## 2017-04-27 DIAGNOSIS — R1032 Left lower quadrant pain: Secondary | ICD-10-CM | POA: Diagnosis not present

## 2017-05-23 DIAGNOSIS — H4303 Vitreous prolapse, bilateral: Secondary | ICD-10-CM | POA: Diagnosis not present

## 2017-05-23 DIAGNOSIS — H524 Presbyopia: Secondary | ICD-10-CM | POA: Diagnosis not present

## 2017-07-05 DIAGNOSIS — M48061 Spinal stenosis, lumbar region without neurogenic claudication: Secondary | ICD-10-CM | POA: Diagnosis not present

## 2017-07-05 DIAGNOSIS — M4316 Spondylolisthesis, lumbar region: Secondary | ICD-10-CM | POA: Diagnosis not present

## 2017-07-05 DIAGNOSIS — M533 Sacrococcygeal disorders, not elsewhere classified: Secondary | ICD-10-CM | POA: Diagnosis not present

## 2017-07-11 DIAGNOSIS — M25562 Pain in left knee: Secondary | ICD-10-CM | POA: Diagnosis not present

## 2017-07-11 DIAGNOSIS — E038 Other specified hypothyroidism: Secondary | ICD-10-CM | POA: Diagnosis not present

## 2017-07-11 DIAGNOSIS — M25561 Pain in right knee: Secondary | ICD-10-CM | POA: Diagnosis not present

## 2017-07-11 DIAGNOSIS — E782 Mixed hyperlipidemia: Secondary | ICD-10-CM | POA: Diagnosis not present

## 2017-07-11 DIAGNOSIS — I498 Other specified cardiac arrhythmias: Secondary | ICD-10-CM | POA: Diagnosis not present

## 2017-07-11 DIAGNOSIS — D510 Vitamin B12 deficiency anemia due to intrinsic factor deficiency: Secondary | ICD-10-CM | POA: Diagnosis not present

## 2017-07-12 DIAGNOSIS — E038 Other specified hypothyroidism: Secondary | ICD-10-CM | POA: Diagnosis not present

## 2017-07-12 DIAGNOSIS — E782 Mixed hyperlipidemia: Secondary | ICD-10-CM | POA: Diagnosis not present

## 2017-07-12 DIAGNOSIS — D510 Vitamin B12 deficiency anemia due to intrinsic factor deficiency: Secondary | ICD-10-CM | POA: Diagnosis not present

## 2017-07-12 DIAGNOSIS — R7301 Impaired fasting glucose: Secondary | ICD-10-CM | POA: Diagnosis not present

## 2017-07-28 ENCOUNTER — Encounter: Payer: Self-pay | Admitting: Family Medicine

## 2017-07-28 DIAGNOSIS — M85832 Other specified disorders of bone density and structure, left forearm: Secondary | ICD-10-CM | POA: Diagnosis not present

## 2017-07-28 DIAGNOSIS — N959 Unspecified menopausal and perimenopausal disorder: Secondary | ICD-10-CM | POA: Diagnosis not present

## 2017-07-28 LAB — HM DEXA SCAN

## 2017-08-07 DIAGNOSIS — F331 Major depressive disorder, recurrent, moderate: Secondary | ICD-10-CM | POA: Diagnosis not present

## 2017-08-07 DIAGNOSIS — R0789 Other chest pain: Secondary | ICD-10-CM | POA: Diagnosis not present

## 2017-08-11 ENCOUNTER — Telehealth: Payer: Self-pay

## 2017-08-11 NOTE — Telephone Encounter (Signed)
LATE ENTRY: SENT REFERRAL TO SCHEDULING, NOTES FROM DR KIRSTEN [name] PH#[phone]

## 2017-08-16 DIAGNOSIS — K591 Functional diarrhea: Secondary | ICD-10-CM | POA: Diagnosis not present

## 2017-08-16 DIAGNOSIS — J0100 Acute maxillary sinusitis, unspecified: Secondary | ICD-10-CM | POA: Diagnosis not present

## 2017-08-16 DIAGNOSIS — E7800 Pure hypercholesterolemia, unspecified: Secondary | ICD-10-CM | POA: Diagnosis not present

## 2017-09-11 DIAGNOSIS — N3000 Acute cystitis without hematuria: Secondary | ICD-10-CM | POA: Diagnosis not present

## 2017-09-11 DIAGNOSIS — F330 Major depressive disorder, recurrent, mild: Secondary | ICD-10-CM | POA: Diagnosis not present

## 2017-09-14 DIAGNOSIS — N3000 Acute cystitis without hematuria: Secondary | ICD-10-CM | POA: Diagnosis not present

## 2017-09-27 ENCOUNTER — Ambulatory Visit (INDEPENDENT_AMBULATORY_CARE_PROVIDER_SITE_OTHER): Payer: Medicare Other | Admitting: Interventional Cardiology

## 2017-09-27 ENCOUNTER — Encounter: Payer: Self-pay | Admitting: Interventional Cardiology

## 2017-09-27 VITALS — BP 106/56 | HR 79 | Ht 65.0 in | Wt 178.0 lb

## 2017-09-27 DIAGNOSIS — M79604 Pain in right leg: Secondary | ICD-10-CM | POA: Diagnosis not present

## 2017-09-27 DIAGNOSIS — R0789 Other chest pain: Secondary | ICD-10-CM

## 2017-09-27 DIAGNOSIS — M79605 Pain in left leg: Secondary | ICD-10-CM

## 2017-09-27 DIAGNOSIS — Z8249 Family history of ischemic heart disease and other diseases of the circulatory system: Secondary | ICD-10-CM

## 2017-09-27 NOTE — Patient Instructions (Signed)
Medication Instructions:  Your physician recommends that you continue on your current medications as directed. Please refer to the Current Medication list given to you today.   Labwork: None ordered  Testing/Procedures: Your physician has requested that you have an exercise tolerance test. For further information please visit [email or web address]. Please also follow instruction sheet, as given.  Follow-Up: Your physician wants you to follow-up in: 1 year with Dr. Irish Lack. You will receive a reminder letter in the mail two months in advance. If you don't receive a letter, please call our office to schedule the follow-up appointment.   Any Other Special Instructions Will Be Listed Below (If Applicable).   Exercise Stress Electrocardiogram An exercise stress electrocardiogram is a test to check how blood flows to your heart. It is done to find areas of poor blood flow. You will need to walk on a treadmill for this test. The electrocardiogram will record your heartbeat when you are at rest and when you are exercising. What happens before the procedure?  Do not have drinks with caffeine or foods with caffeine for 24 hours before the test, or as told by your doctor. This includes coffee, tea (even decaf tea), sodas, chocolate, and cocoa.  Follow your doctor's instructions about eating and drinking before the test.  Ask your doctor what medicines you should or should not take before the test. Take your medicines with water unless told by your doctor not to.  If you use an inhaler, bring it with you to the test.  Bring a snack to eat after the test.  Do not  smoke for 4 hours before the test.  Do not put lotions, powders, creams, or oils on your chest before the test.  Wear comfortable shoes and clothing. What happens during the procedure?  You will have patches put on your chest. Small areas of your chest may need to be shaved. Wires will be connected to the patches.  Your heart rate  will be watched while you are resting and while you are exercising.  You will walk on the treadmill. The treadmill will slowly get faster to raise your heart rate.  The test will take about 1-2 hours. What happens after the procedure?  Your heart rate and blood pressure will be watched after the test.  You may return to your normal diet, activities, and medicines or as told by your doctor. This information is not intended to replace advice given to you by your health care provider. Make sure you discuss any questions you have with your health care provider. Document Released: 11/23/2007 Document Revised: 02/03/2016 Document Reviewed: 02/11/2013 Elsevier Interactive Patient Education  [employer].    If you need a refill on your cardiac medications before your next appointment, please call your pharmacy.

## 2017-09-27 NOTE — Progress Notes (Signed)
Cardiology Office Note   Date:  09/27/2017   ID:  Courtney [name], DOB April 03, 1946, MRN [id number]  PCP:  Angelina Sheriff, MD    No chief complaint on file.  Chest pain  Wt Readings from Last 3 Encounters:  09/27/17 178 lb (80.7 kg)  07/29/15 176 lb (79.8 kg)  07/22/15 176 lb 4.8 oz (80 kg)       History of Present Illness: Courtney [name] is a 72 y.o. female who is being seen today for the evaluation of chest pain at the request of [name], Kirsten, MD.  She has a family h/o CAD.  Father had MI at 7.  Several brothers have had CABG.  She has a prior history of depression and weight loss surgery.  At [facility], she had a cardiac cath in 2001 showing no significant coronary artery disease.  No subsequent cath since then.   SHe was having family stress since that time.   She had a stress test at [facility] several years ago and this was reportedly normal.    Per the record from Dr. Philbert Riser: Cardiac work up: - 04/2011: ETT negative for ischemia - 03/2013: ESE achieved 92% MPHR, 11 METS, no symptoms and negative - 12/2013: Holter with SR and occasional PACs, rare PVCs. No symptoms in diary   She reported a chest pain episode to Dr. Tobie Poet.  THis occurred on 08/07/17.  SHe went to the office.  ECG was normal.  Pain was less than 10 minutes.  She had a second episode a few weeks later and it resolved spontaneously.  Walking is her most strenuous activity.  No CP with walking.  Exercise limited by back pain.  She has had prior back surgery.  She has severe leg pain.  THis is her main issue.  SHe is taking BC powder for pain relief. Pain is constant.  No trigger.   Prolonged standing makes it worse.  Sitting down helps the discomfort.  Denies : Dizziness. Leg edema. Nitroglycerin use. Orthopnea. Palpitations. Paroxysmal nocturnal dyspnea. Shortness of breath. Syncope.       Past Medical History:  Diagnosis Date  . Anemia   . Arthritis   . Depression, major, recurrent, moderate  ([place])   . Heart murmur   . History of bladder infections   . Hyperlipidemia   . Hypertension   . Hypothyroidism   . Knee pain, bilateral   . Osteoarthritis   . Recovering alcoholic ([place])   . SVT (supraventricular tachycardia) ([place])   . Varicose veins   . Vitamin B12 deficiency     Past Surgical History:  Procedure Laterality Date  . ABDOMINAL HYSTERECTOMY  1991  . APPENDECTOMY    . BREAST ENHANCEMENT SURGERY  2006  . KNEE ARTHROSCOPY    . lap band surgery  1981  . LUMBAR FUSION  07/29/2015   posterior level one  . removal of cervical disc fragments  10-2007     Current Outpatient Medications  Medication Sig Dispense Refill  . aspirin EC 81 MG tablet Take 81 mg by mouth daily.    [name] buPROPion (WELLBUTRIN SR) 150 MG 12 hr tablet Take 150 mg by mouth daily.    . fluticasone (FLONASE) 50 MCG/ACT nasal spray Place 1 spray into both nostrils as needed for allergies.    . furosemide (LASIX) 20 MG tablet Take 20 mg by mouth daily as needed for fluid.     [name] HYDROCODONE-ACETAMINOPHEN PO Take 500 mg by mouth as  needed.    . metoprolol tartrate (LOPRESSOR) 25 MG tablet Take 25 mg by mouth 2 (two) times daily.    . Multiple Vitamins-Minerals (CENTRUM SILVER ADULT 50+ PO) Take 1 tablet by mouth daily.    . nitroGLYCERIN (NITROSTAT) 0.4 MG SL tablet DISSOLVE 1 TABLET UNDER THE TONGUE EVERY 5 MINUTES AS NEEDED FOR CHEST PAIN. (MAXIMUM OF 3 DOSES)  0  . potassium chloride (K-DUR,KLOR-CON) 10 MEQ tablet Take 10 mEq by mouth daily as needed (when taking lasix).     . pravastatin (PRAVACHOL) 20 MG tablet Take 20 mg by mouth daily.    [name] PROAIR HFA 108 (90 BASE) MCG/ACT inhaler Inhale 2 puffs into the lungs as needed for wheezing or shortness of breath.     . rosuvastatin (CRESTOR) 20 MG tablet Take 20 mg by mouth daily.    . vitamin B-12 (CYANOCOBALAMIN) 1000 MCG tablet Inject 1,000 mcg as directed every 30 (thirty) days. Reported on 06/10/2015     No current facility-administered medications  for this visit.     Allergies:   Levofloxacin; Prednisone; Statins; and Gabapentin    Social History:  The patient  reports that she has never smoked. She has never used smokeless tobacco. She reports that she does not drink alcohol or use drugs.   Family History:  The patient's family history includes Diabetes in her sister; Heart disease in her brother, brother, brother, brother, brother, father, and mother; Other in her sister.    ROS:  Please see the history of present illness.   Otherwise, review of systems are positive for chest pain, back pain.   All other systems are reviewed and negative.    PHYSICAL EXAM: VS:  BP (!) 106/56 (BP Location: Right Arm, Patient Position: Sitting, Cuff Size: Normal)   Pulse 79   Ht 5\' 5"  (1.651 m)   Wt 178 lb (80.7 kg)   SpO2 94%   BMI 29.62 kg/m  , BMI Body mass index is 29.62 kg/m. GEN: Well nourished, well developed, in no acute distress  HEENT: normal  Neck: no JVD, carotid bruits, or masses Cardiac: RRR; no murmurs, rubs, or gallops,no edema  Respiratory:  clear to auscultation bilaterally, normal work of breathing GI: soft, nontender, nondistended, + BS MS: no deformity or atrophy  Skin: warm and dry, no rash Neuro:  Strength and sensation are intact Psych: euthymic mood, full affect   EKG:   The ekg ordered 07/2017 demonstrates Normal ECG   Recent Labs: No results found for requested labs within last 8760 hours.   Lipid Panel    Component Value Date/Time   CHOL (H) 09/20/2009 2300    213        ATP III CLASSIFICATION:  <200     mg/dL   Desirable  200-239  mg/dL   Borderline High  >=240    mg/dL   High          TRIG 81 09/20/2009 2300   HDL 51 09/20/2009 2300   CHOLHDL 4.2 09/20/2009 2300   VLDL 16 09/20/2009 2300   LDLCALC (H) 09/20/2009 2300    146        Total Cholesterol/HDL:CHD Risk Coronary Heart Disease Risk Table                     Men   Women  1/2 Average Risk   3.4   3.3  Average Risk       5.0   4.4   2 X  Average Risk   9.6   7.1  3 X Average Risk  23.4   11.0        Use the calculated Patient Ratio above and the CHD Risk Table to determine the patient's CHD Risk.        ATP III CLASSIFICATION (LDL):  <100     mg/dL   Optimal  100-129  mg/dL   Near or Above                    Optimal  130-159  mg/dL   Borderline  160-189  mg/dL   High  >190     mg/dL   Very High     Other studies Reviewed: Additional studies/ records that were reviewed today with results demonstrating: prior ECG from 2/19 reviewed.   ASSESSMENT AND PLAN:  1. Atypical chest pain: Plan ETT.  Not related to exertion and has been infrequent.  Even she does not feel this is related to her heart. 2. Leg pain: Bilateral .  Not claudication.  May be neurologic in nature.  F/u with PMD.  DP pulses palpable, but could consider arterial Dopplers if there is a concern for PA. 3. Family h/o CAD: Aggressive preventive therapy.  Healthy diet and regular exercise as noted below.    Current medicines are reviewed at length with the patient today.  The patient concerns regarding her medicines were addressed.  The following changes have been made:  No change  Labs/ tests ordered today include:  No orders of the defined types were placed in this encounter.   Recommend 150 minutes/week of aerobic exercise Low fat, low carb, high fiber diet recommended  Disposition:   FU for stress test   Signed, Larae Grooms, MD  09/27/2017 10:44 AM    [facility] [place], [place], [place]  [address] Phone: [phone]; Fax: [phone]

## 2017-10-02 DIAGNOSIS — M5137 Other intervertebral disc degeneration, lumbosacral region: Secondary | ICD-10-CM | POA: Diagnosis not present

## 2017-10-02 DIAGNOSIS — G894 Chronic pain syndrome: Secondary | ICD-10-CM | POA: Diagnosis not present

## 2017-10-02 DIAGNOSIS — M533 Sacrococcygeal disorders, not elsewhere classified: Secondary | ICD-10-CM | POA: Diagnosis not present

## 2017-10-02 DIAGNOSIS — M48061 Spinal stenosis, lumbar region without neurogenic claudication: Secondary | ICD-10-CM | POA: Diagnosis not present

## 2017-10-02 DIAGNOSIS — Z79891 Long term (current) use of opiate analgesic: Secondary | ICD-10-CM | POA: Diagnosis not present

## 2017-10-17 DIAGNOSIS — R600 Localized edema: Secondary | ICD-10-CM | POA: Diagnosis not present

## 2017-10-17 DIAGNOSIS — J301 Allergic rhinitis due to pollen: Secondary | ICD-10-CM | POA: Diagnosis not present

## 2017-10-17 DIAGNOSIS — R39198 Other difficulties with micturition: Secondary | ICD-10-CM | POA: Diagnosis not present

## 2017-10-17 DIAGNOSIS — I498 Other specified cardiac arrhythmias: Secondary | ICD-10-CM | POA: Diagnosis not present

## 2017-10-17 DIAGNOSIS — E782 Mixed hyperlipidemia: Secondary | ICD-10-CM | POA: Diagnosis not present

## 2017-10-17 DIAGNOSIS — R7301 Impaired fasting glucose: Secondary | ICD-10-CM | POA: Diagnosis not present

## 2017-10-17 DIAGNOSIS — N3000 Acute cystitis without hematuria: Secondary | ICD-10-CM | POA: Diagnosis not present

## 2017-12-13 DIAGNOSIS — E782 Mixed hyperlipidemia: Secondary | ICD-10-CM | POA: Diagnosis not present

## 2017-12-13 DIAGNOSIS — R7301 Impaired fasting glucose: Secondary | ICD-10-CM | POA: Diagnosis not present

## 2017-12-27 DIAGNOSIS — R740 Nonspecific elevation of levels of transaminase and lactic acid dehydrogenase [LDH]: Secondary | ICD-10-CM | POA: Diagnosis not present

## 2018-01-05 DIAGNOSIS — M4316 Spondylolisthesis, lumbar region: Secondary | ICD-10-CM | POA: Diagnosis not present

## 2018-01-05 DIAGNOSIS — M48061 Spinal stenosis, lumbar region without neurogenic claudication: Secondary | ICD-10-CM | POA: Diagnosis not present

## 2018-01-05 DIAGNOSIS — M533 Sacrococcygeal disorders, not elsewhere classified: Secondary | ICD-10-CM | POA: Diagnosis not present

## 2018-01-05 DIAGNOSIS — M5137 Other intervertebral disc degeneration, lumbosacral region: Secondary | ICD-10-CM | POA: Diagnosis not present

## 2018-01-23 DIAGNOSIS — R740 Nonspecific elevation of levels of transaminase and lactic acid dehydrogenase [LDH]: Secondary | ICD-10-CM | POA: Diagnosis not present

## 2018-01-25 DIAGNOSIS — K828 Other specified diseases of gallbladder: Secondary | ICD-10-CM | POA: Diagnosis not present

## 2018-01-25 DIAGNOSIS — R945 Abnormal results of liver function studies: Secondary | ICD-10-CM | POA: Diagnosis not present

## 2018-01-26 DIAGNOSIS — M545 Low back pain: Secondary | ICD-10-CM | POA: Diagnosis not present

## 2018-01-26 DIAGNOSIS — R945 Abnormal results of liver function studies: Secondary | ICD-10-CM | POA: Diagnosis not present

## 2018-01-26 DIAGNOSIS — M79661 Pain in right lower leg: Secondary | ICD-10-CM | POA: Diagnosis not present

## 2018-01-30 ENCOUNTER — Other Ambulatory Visit: Payer: Self-pay

## 2018-01-30 ENCOUNTER — Emergency Department (HOSPITAL_COMMUNITY)
Admission: EM | Admit: 2018-01-30 | Discharge: 2018-01-30 | Disposition: A | Discharge status: Home / Self Care | Payer: Medicare Other | Attending: Emergency Medicine | Admitting: Emergency Medicine

## 2018-01-30 ENCOUNTER — Emergency Department (HOSPITAL_COMMUNITY): Payer: Medicare Other

## 2018-01-30 ENCOUNTER — Encounter (HOSPITAL_COMMUNITY): Payer: Self-pay | Admitting: Emergency Medicine

## 2018-01-30 DIAGNOSIS — R109 Unspecified abdominal pain: Secondary | ICD-10-CM | POA: Diagnosis not present

## 2018-01-30 DIAGNOSIS — R945 Abnormal results of liver function studies: Secondary | ICD-10-CM | POA: Diagnosis not present

## 2018-01-30 DIAGNOSIS — R1011 Right upper quadrant pain: Secondary | ICD-10-CM | POA: Diagnosis present

## 2018-01-30 DIAGNOSIS — I10 Essential (primary) hypertension: Secondary | ICD-10-CM | POA: Insufficient documentation

## 2018-01-30 DIAGNOSIS — E039 Hypothyroidism, unspecified: Secondary | ICD-10-CM | POA: Insufficient documentation

## 2018-01-30 DIAGNOSIS — R748 Abnormal levels of other serum enzymes: Secondary | ICD-10-CM | POA: Diagnosis not present

## 2018-01-30 DIAGNOSIS — J189 Pneumonia, unspecified organism: Secondary | ICD-10-CM | POA: Diagnosis not present

## 2018-01-30 DIAGNOSIS — Z79899 Other long term (current) drug therapy: Secondary | ICD-10-CM | POA: Diagnosis not present

## 2018-01-30 DIAGNOSIS — R000 Tachycardia, unspecified: Secondary | ICD-10-CM | POA: Diagnosis not present

## 2018-01-30 DIAGNOSIS — Z7982 Long term (current) use of aspirin: Secondary | ICD-10-CM | POA: Insufficient documentation

## 2018-01-30 LAB — URINALYSIS, ROUTINE W REFLEX MICROSCOPIC
Bilirubin Urine: NEGATIVE
Glucose, UA: NEGATIVE mg/dL
HGB URINE DIPSTICK: NEGATIVE
Ketones, ur: NEGATIVE mg/dL
Nitrite: POSITIVE — AB
PROTEIN: NEGATIVE mg/dL
SPECIFIC GRAVITY, URINE: 1.004 — AB (ref 1.005–1.030)
pH: 7 (ref 5.0–8.0)

## 2018-01-30 LAB — COMPREHENSIVE METABOLIC PANEL
ALT: 238 U/L — AB (ref 0–44)
AST: 194 U/L — AB (ref 15–41)
Albumin: 4.1 g/dL (ref 3.5–5.0)
Alkaline Phosphatase: 104 U/L (ref 38–126)
Anion gap: 11 (ref 5–15)
BUN: 10 mg/dL (ref 8–23)
CHLORIDE: 104 mmol/L (ref 98–111)
CO2: 24 mmol/L (ref 22–32)
Calcium: 9.7 mg/dL (ref 8.9–10.3)
Creatinine, Ser: 0.79 mg/dL (ref 0.44–1.00)
GFR calc non Af Amer: >60 mL/min (ref >60)
Glucose, Bld: 98 mg/dL (ref 70–99)
POTASSIUM: 3.8 mmol/L (ref 3.5–5.1)
SODIUM: 139 mmol/L (ref 135–145)
Total Bilirubin: 0.8 mg/dL (ref 0.3–1.2)
Total Protein: 7.9 g/dL (ref 6.5–8.1)

## 2018-01-30 LAB — CBC
HEMATOCRIT: 43.9 % (ref 36.0–46.0)
HEMOGLOBIN: 13.8 g/dL (ref 12.0–15.0)
MCH: 29.8 pg (ref 26.0–34.0)
MCHC: 31.4 g/dL (ref 30.0–36.0)
MCV: 94.8 fL (ref 78.0–100.0)
Platelets: 228 10*3/uL (ref 150–400)
RBC: 4.63 MIL/uL (ref 3.87–5.11)
RDW: 13.2 % (ref 11.5–15.5)
WBC: 6.6 10*3/uL (ref 4.0–10.5)

## 2018-01-30 LAB — LIPASE, BLOOD: LIPASE: 32 U/L (ref 11–51)

## 2018-01-30 MED ORDER — IOPAMIDOL (ISOVUE-370) INJECTION 76%
100.0000 mL | Freq: Once | INTRAVENOUS | Status: AC | PRN
  Administered 2018-01-30: 100 mL via INTRAVENOUS

## 2018-01-30 MED ORDER — HYDROCODONE-ACETAMINOPHEN 5-325 MG PO TABS: 1.0000 | ORAL_TABLET | Freq: Four times a day (QID) | ORAL | 0 refills | Status: DC | PRN

## 2018-01-30 MED ORDER — MORPHINE SULFATE (PF) 4 MG/ML IV SOLN
4.0000 mg | Freq: Once | INTRAVENOUS | Status: AC
  Administered 2018-01-30: 4 mg via INTRAVENOUS
  Filled 2018-01-30: qty 1

## 2018-01-30 MED ORDER — PANTOPRAZOLE SODIUM 20 MG PO TBEC: 20.0000 mg | DELAYED_RELEASE_TABLET | Freq: Every day | ORAL | 0 refills | Status: DC

## 2018-01-30 MED ORDER — IOPAMIDOL (ISOVUE-370) INJECTION 76%
INTRAVENOUS | Status: AC
  Filled 2018-01-30: qty 100

## 2018-01-30 MED ORDER — DOXYCYCLINE HYCLATE 100 MG PO CAPS: ORAL_CAPSULE | ORAL | 0 refills | Status: DC

## 2018-01-30 MED ORDER — SODIUM CHLORIDE 0.9 % IV SOLN
INTRAVENOUS | Status: DC
  Administered 2018-01-30: 16:00:00 via INTRAVENOUS

## 2018-01-30 NOTE — Discharge Instructions (Addendum)
Follow-up with your family doctor next week to recheck this pneumonia.  Follow-up with Dr. Lyndel Safe within a week.

## 2018-01-30 NOTE — ED Notes (Signed)
Communicated request for additional pain meds to MD Zammit 

## 2018-01-30 NOTE — ED Triage Notes (Signed)
Pt here today for elevated liver enzymes with upper abd pain and sob. Pt states this has been going on for a few months and has recently gotten a lot worse.

## 2018-01-30 NOTE — ED Provider Notes (Signed)
[facility] EMERGENCY DEPARTMENT Provider Note   CSN: [id number] Arrival date & time: 01/30/18  1207     History   Chief Complaint Chief Complaint  Patient presents with  . Abdominal Pain    HPI Courtney [name] is a 72 y.o. female.  HPI  The patient is a 72 year old female, she has a known history of B12 deficiency, SVT, hypothyroidism, depression and hyperlipidemia.  She does not smoke or drink any alcohol and uses very little Tylenol.  The patient presents with a complaint of abdominal pain on the right as well as chest pain on the left.  She states that her chest pain is been going on for approximately 1 week, it is a left-sided sharp chest pain around the left breast, it seems to get worse when she takes a deep breath, is not associated with coughing but she does state that she is having some night sweats intermittently.  Her abdominal pain is in the right upper quadrant, she reports that she had normal blood work at her January evaluation at her doctor's office but within the last couple of months has had some elevated liver function tests which have been trended by the family doctor and have risen to just over 200 with respect to the AST and ALT.  Because of this an ultrasound of the abdomen and the gallbladder with the liver was performed which was negative for any acute findings and the patient was referred here to the hospital because her liver function tests had slightly increased.  She was told by her primary care doctor that she likely needed to be admitted and have biopsies done while she was an inpatient.  The patient denies any changes in her stool habits other than having intermittent mucousy stools, intermittent constipation but no blood in the stools.  She has had surgery as follows, she has had bilateral breast implants, she denies diverticulitis, has had a normal appetite, she has had a prior lap band surgery but no longer keeps it inflated, she has  lost plenty of weight and kept it off.  No tobacco use, no alcohol use.  No international or tropical travel  Past Medical History:  Diagnosis Date  . Anemia   . Arthritis   . Depression, major, recurrent, moderate ([place])   . Heart murmur   . History of bladder infections   . Hyperlipidemia   . Hypertension   . Hypothyroidism   . Knee pain, bilateral   . Osteoarthritis   . Recovering alcoholic ([place])   . SVT (supraventricular tachycardia) ([place])   . Varicose veins   . Vitamin B12 deficiency     Patient Active Problem List   Diagnosis Date Noted  . Spondylolisthesis of lumbar region 07/29/2015  . Varicose veins of both lower extremities with pain 02/10/2015  . Chest wall pain 07/27/2014  . 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 ABDOMINAL HYSTERECTOMY  1991  . APPENDECTOMY    . BREAST ENHANCEMENT SURGERY  2006  . KNEE ARTHROSCOPY    . lap band surgery  1981  . LUMBAR FUSION  07/29/2015   posterior level one  . removal of cervical disc fragments  10-2007     OB History   None      Home Medications    Prior to Admission medications   Medication Sig Start Date End Date Taking? Authorizing Provider  aspirin EC 81 MG  tablet Take 81 mg by mouth daily.   Yes [provider]  fluticasone (FLONASE) 50 MCG/ACT nasal spray Place 1 spray into both nostrils as needed for allergies. 08/07/17  Yes [provider]  furosemide (LASIX) 20 MG tablet Take 20 mg by mouth daily.  01/13/15  Yes [provider]  levothyroxine (SYNTHROID, LEVOTHROID) 50 MCG tablet Take 50 mcg by mouth daily before breakfast.   Yes [provider]  linaclotide (LINZESS) 290 MCG CAPS capsule Take 290 mcg by mouth daily before breakfast.   Yes [provider]  metoprolol tartrate (LOPRESSOR) 25 MG tablet Take 25 mg by mouth 2 (two) times daily. 06/27/14  Yes [provider]    nitroGLYCERIN (NITROSTAT) 0.4 MG SL tablet Place 0.4 mg under the tongue every 5 (five) minutes as needed for chest pain.  08/07/17  Yes [provider]  PROAIR HFA 108 (90 BASE) MCG/ACT inhaler Inhale 2 puffs into the lungs as needed for wheezing or shortness of breath.  05/21/14  Yes [provider]    Family History Family History  Problem Relation Age of Onset  . Heart disease Mother   . Heart disease Father   . Heart disease Brother   . Diabetes Sister   . Heart disease Brother   . Heart disease Brother   . Heart disease Brother   . Heart disease Brother   . Other Sister        BRAIN TUMOR    Social History Social History   Tobacco Use  . Smoking status: Never Smoker  . Smokeless tobacco: Never Used  Substance Use Topics  . 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 Drug use: No     Allergies   Levofloxacin; Prednisone; Statins; and Gabapentin   Review of Systems Review of Systems  All other systems reviewed and are negative.    Physical Exam Updated Vital Signs BP (!) 142/77 (BP Location: Right Arm)   Pulse 68   Temp 98.3 F (36.8 C) (Oral)   Resp 16   SpO2 98%   Physical Exam  Constitutional: She appears well-developed and well-nourished. No distress.  HENT:  Head: Normocephalic and atraumatic.  Mouth/Throat: Oropharynx is clear and moist. No oropharyngeal exudate.  Eyes: Pupils are equal, round, and reactive to light. Conjunctivae and EOM are normal. Right eye exhibits no discharge. Left eye exhibits no discharge. No scleral icterus.  No jaundice  Neck: Normal range of motion. Neck supple. No JVD present. No thyromegaly present.  Cardiovascular: Normal rate, regular rhythm, normal heart sounds and intact distal pulses. Exam reveals no gallop and no friction rub.  No murmur heard. Pulmonary/Chest: Effort normal and breath sounds normal. No respiratory distress. She has no wheezes. She has  no rales. She exhibits no tenderness.  The patient has visible discomfort to taking deep breaths but normal lung sounds  Abdominal: Soft. Bowel sounds are normal. She exhibits no distension and no mass. There is no tenderness.  The abdomen is tender around the right liver border, there is no palpable hepatosplenomegaly.  The left side of the abdomen is nontender  Musculoskeletal: Normal range of motion. She exhibits no edema or tenderness.  Lymphadenopathy:    She has no cervical adenopathy.  Neurological: She is alert. Coordination normal.  Skin: Skin is warm and dry. No rash noted. No erythema.  Wound on the right lower side which is scabbed over where the  patient had a heat pack and left and on too long.  Psychiatric: She has a normal mood and affect. Her behavior is normal.  Nursing note and vitals reviewed.    ED Treatments / Results  Labs (all labs ordered are listed, but only abnormal results are displayed) Labs Reviewed  COMPREHENSIVE METABOLIC PANEL - Abnormal; Notable for the following components:      Result Value   AST 194 (*)    ALT 238 (*)    All other components within normal limits  URINALYSIS, ROUTINE W REFLEX MICROSCOPIC - Abnormal; Notable for the following components:   Color, Urine AMBER (*)    Specific Gravity, Urine 1.004 (*)    Nitrite POSITIVE (*)    Leukocytes, UA SMALL (*)    Bacteria, UA RARE (*)    All other components within normal limits  LIPASE, BLOOD  CBC    EKG None  Radiology No results found.  Procedures Procedures (including critical care time)  Angiocath insertion Performed by: Johnna Acosta  Consent: Verbal consent obtained. Risks and benefits: risks, benefits and alternatives were discussed Time out: Immediately prior to procedure a "time out" was called to verify the correct patient, procedure, equipment, support staff and site/side marked as required.  Preparation: Patient was prepped and draped in the usual sterile  fashion.  Vein Location: L AC  Not Ultrasound Guided  Gauge: 20  Normal blood return and flush without difficulty Patient tolerance: Patient tolerated the procedure well with no immediate complications.     Medications Ordered in ED Medications - No data to display   Initial Impression / Assessment and Plan / ED Course  I have reviewed the triage vital signs and the nursing notes.  Pertinent labs & imaging results that were available during my care of the patient were reviewed by me and considered in my medical decision making (see chart for details).    The urinalysis shows no obvious infections, the lab work is unremarkable with regards to the blood counts which show no signs of leukocytosis or thrombocytopenia, no anemia, her liver function tests are elevated around 200 but bilirubin is normal.  Given her abdominal tenderness, her persistent hepatic dysfunction I will obtain an acute hepatitis panel as well as a CT angiogram of the chest and a CT abdomen and pelvis to further evaluate her anatomy to make sure there is no obstructions, cancers or other acute problems.  She will most likely need to be referred to gastroenterology if there is no acute admittable diagnosis seen.  She would like to see Dr. Lyndel Safe in follow-up  I d/w Azucena Freed - with GI - agrees with outpt f/u if w/u neg here. D/w Dr. Roderic Palau who agrees to take pt at change of shif t- to f/u CT and dispoition accordingly.  Final Clinical Impressions(s) / ED Diagnoses   Final diagnoses:  None    ED Discharge Orders    None       Noemi Chapel, MD 01/30/18 1601

## 2018-01-30 NOTE — ED Provider Notes (Signed)
Patient CT scan does not show PE but shows possible bilateral pneumonia.  Also she has some esophagitis.  Patient is nontoxic pulse ox normal white count normal.  She will be discharged home on doxycycline Vicodin and Protonix.  She is to follow-up with family doctor for possible pneumonia and she is going to follow-up with her GI doctor for her elevated liver enzymes   Milton Ferguson, MD 01/30/18 1948

## 2018-01-31 ENCOUNTER — Encounter: Payer: Self-pay | Admitting: Gastroenterology

## 2018-01-31 LAB — HEPATITIS PANEL, ACUTE
HEP A IGM: NEGATIVE
Hep B C IgM: NEGATIVE
Hepatitis B Surface Ag: NEGATIVE

## 2018-02-01 DIAGNOSIS — R5383 Other fatigue: Secondary | ICD-10-CM | POA: Diagnosis not present

## 2018-02-01 DIAGNOSIS — R945 Abnormal results of liver function studies: Secondary | ICD-10-CM | POA: Diagnosis not present

## 2018-02-01 DIAGNOSIS — J158 Pneumonia due to other specified bacteria: Secondary | ICD-10-CM | POA: Diagnosis not present

## 2018-02-01 DIAGNOSIS — R740 Nonspecific elevation of levels of transaminase and lactic acid dehydrogenase [LDH]: Secondary | ICD-10-CM | POA: Diagnosis not present

## 2018-02-01 DIAGNOSIS — K7689 Other specified diseases of liver: Secondary | ICD-10-CM | POA: Diagnosis not present

## 2018-02-05 DIAGNOSIS — R945 Abnormal results of liver function studies: Secondary | ICD-10-CM | POA: Diagnosis not present

## 2018-02-05 DIAGNOSIS — R918 Other nonspecific abnormal finding of lung field: Secondary | ICD-10-CM | POA: Diagnosis not present

## 2018-02-05 DIAGNOSIS — J158 Pneumonia due to other specified bacteria: Secondary | ICD-10-CM | POA: Diagnosis not present

## 2018-02-06 DIAGNOSIS — M5137 Other intervertebral disc degeneration, lumbosacral region: Secondary | ICD-10-CM | POA: Diagnosis not present

## 2018-02-06 DIAGNOSIS — M533 Sacrococcygeal disorders, not elsewhere classified: Secondary | ICD-10-CM | POA: Diagnosis not present

## 2018-02-06 DIAGNOSIS — M545 Low back pain: Secondary | ICD-10-CM | POA: Diagnosis not present

## 2018-02-06 DIAGNOSIS — M4316 Spondylolisthesis, lumbar region: Secondary | ICD-10-CM | POA: Diagnosis not present

## 2018-02-06 DIAGNOSIS — M48061 Spinal stenosis, lumbar region without neurogenic claudication: Secondary | ICD-10-CM | POA: Diagnosis not present

## 2018-02-08 ENCOUNTER — Telehealth: Payer: Self-pay | Admitting: Interventional Cardiology

## 2018-02-08 NOTE — Telephone Encounter (Signed)
Returned call to patient who states that she has some swelling in her legs and ankles. She states that it is non-pitting. She states that she has had some SOB on exertion for the past couple of weeks. She states that she is currently being treated for pneumonia. She states that she has lost weight. Denies any other Sx. Patient takes lasix 20 mg QD. Patient has appointment with ML on 9/24. Instructed the patient to elevate legs to help with swelling, avoid salt in her diet including foods that are high in sodium, weigh herself daily and let us know if her Sx change or worsen. Patient verbalized understanding and thanked me for the call.

## 2018-02-08 NOTE — Telephone Encounter (Signed)
New Message:   Pt has appt with lenze 9/24 but would like an earlier appt   Pt c/o swelling: STAT is pt has developed SOB within 24 hours  1) How much weight have you gained and in what time span? No   2) If swelling, where is the swelling located? Legs  3) Are you currently taking a fluid pill?  Yes   4) Are you currently SOB? No   5) Do you have a log of your daily weights (if so, list)? No   6) Have you gained 3 pounds in a day or 5 pounds in a week? NO  7) Have you traveled recently? No

## 2018-02-14 DIAGNOSIS — N644 Mastodynia: Secondary | ICD-10-CM | POA: Diagnosis not present

## 2018-02-15 ENCOUNTER — Other Ambulatory Visit: Payer: Self-pay | Admitting: Obstetrics & Gynecology

## 2018-02-15 DIAGNOSIS — N644 Mastodynia: Secondary | ICD-10-CM

## 2018-02-16 ENCOUNTER — Encounter: Payer: Self-pay | Admitting: Gastroenterology

## 2018-02-20 ENCOUNTER — Other Ambulatory Visit: Payer: Medicare Other

## 2018-02-20 ENCOUNTER — Inpatient Hospital Stay
Admission: RE | Admit: 2018-02-20 | Discharge: 2018-02-20 | Disposition: A | Discharge status: Home / Self Care | Payer: Medicare Other | Source: Ambulatory Visit | Attending: Obstetrics & Gynecology | Admitting: Obstetrics & Gynecology

## 2018-02-26 ENCOUNTER — Ambulatory Visit
Admission: RE | Admit: 2018-02-26 | Discharge: 2018-02-26 | Disposition: A | Discharge status: Home / Self Care | Payer: Medicare Other | Source: Ambulatory Visit | Attending: Obstetrics & Gynecology | Admitting: Obstetrics & Gynecology

## 2018-02-26 DIAGNOSIS — R928 Other abnormal and inconclusive findings on diagnostic imaging of breast: Secondary | ICD-10-CM | POA: Diagnosis not present

## 2018-02-26 DIAGNOSIS — N644 Mastodynia: Secondary | ICD-10-CM

## 2018-02-26 DIAGNOSIS — R740 Nonspecific elevation of levels of transaminase and lactic acid dehydrogenase [LDH]: Secondary | ICD-10-CM | POA: Diagnosis not present

## 2018-03-02 ENCOUNTER — Encounter: Payer: Self-pay | Admitting: Gastroenterology

## 2018-03-02 ENCOUNTER — Ambulatory Visit (INDEPENDENT_AMBULATORY_CARE_PROVIDER_SITE_OTHER): Payer: Medicare Other | Admitting: Gastroenterology

## 2018-03-02 ENCOUNTER — Encounter (INDEPENDENT_AMBULATORY_CARE_PROVIDER_SITE_OTHER): Payer: Self-pay

## 2018-03-02 VITALS — BP 108/70 | HR 97 | Ht 65.5 in | Wt 162.1 lb

## 2018-03-02 DIAGNOSIS — R7989 Other specified abnormal findings of blood chemistry: Secondary | ICD-10-CM

## 2018-03-02 DIAGNOSIS — R945 Abnormal results of liver function studies: Secondary | ICD-10-CM | POA: Diagnosis not present

## 2018-03-02 DIAGNOSIS — K5909 Other constipation: Secondary | ICD-10-CM | POA: Diagnosis not present

## 2018-03-02 MED ORDER — LINACLOTIDE 290 MCG PO CAPS: 290.0000 ug | ORAL_CAPSULE | Freq: Every day | ORAL | 11 refills | Status: DC

## 2018-03-02 MED ORDER — LINACLOTIDE 290 MCG PO CAPS: 290.0000 ug | ORAL_CAPSULE | Freq: Every day | ORAL | 0 refills | Status: DC

## 2018-03-02 NOTE — Progress Notes (Signed)
Chief Complaint: Abnormal liver function tests  Referring Provider:  [name], MD      ASSESSMENT AND PLAN;   #1. Abnormal LFTs - likely fatty liver, neg CT 01/30/2018, no ETOH, off all tylenol containing, neg acute hepatitis panel. - Please obtain previous records for blood tests from Dr [name]'s office. - Have discussed regarding hepatic elastography/liver biopsy.  Since the liver function tests are coming down.  She would like to hold off.  I do agree.  - Continue to gradually reduce weight. - FU in 6 months.  #2. Chronic contipation (opioid-induced) -Linzess 290 mcg po qd to continue. Samples given -Increase water intake. -Minimize pain medications. -If still with problems, will give trial of Movantik or Symproic. -Patient is to call us if still with problems.  All the contact numbers were given.    HPI:    Courtney [name] is a 72 y.o. female  Seen at request of Dr. Tobie Poet, patient is RN Follow-up visit for abnormal liver function tests at Dr. Tobie Poet office. She had several blood tests including anti-smooth muscle antibody, AMA, iron studies etc. Pending. Liver function tests have come down. No GI complaints No jaundice dark urine or pale stools. No abdominal pain. No nausea/vomiting, heartburn regurgitation odynophagia or dysphagia. Has long-standing history of chronic constipation for which she takes Linzess.  She is also on pain medications due to chronic back pain and is awaiting repeat evaluation/surgery with Dr. Arnoldo Morale.  Past GI procedures -Colonoscopy 12/01/2016 showing moderate predominantly sigmoid diverticulosis.  Otherwise normal colonoscopy.  Limited due to preparation.  Repeat in 5 years.  Already on recall. Past Medical History:  Diagnosis Date  . Anemia   . Arthritis   . Depression, major, recurrent, moderate ([place])   . Heart murmur   . History of bladder infections   . Hyperlipidemia   . Hypertension   . Hypothyroidism   . Knee pain, bilateral   .  Osteoarthritis   . Pneumonia   . Recovering alcoholic ([name])   . SVT (supraventricular tachycardia) ([place])   . UTI (urinary tract infection)   . Varicose veins   . Vitamin B12 deficiency     Past Surgical History:  Procedure Laterality Date  . ABDOMINAL HYSTERECTOMY  1991  . ANGIOPLASTY     at least 15 years ago  . APPENDECTOMY    . AUGMENTATION MAMMAPLASTY Bilateral   . BACK SURGERY     had 2 surgeries. Have rods placed in back   . BARIATRIC SURGERY     lap band-at least 14 years ago  . BREAST BIOPSY Left    x2  . BREAST ENHANCEMENT SURGERY  2006  . COLONOSCOPY  12/01/2016   Moderate predominantly sigmoid diverticulosis.   [name] KNEE ARTHROSCOPY    . lap band surgery  1981  . LUMBAR FUSION  07/29/2015   posterior level one  . removal of cervical disc fragments  10-2007    Family History  Problem Relation Age of Onset  . Heart disease Mother   . Heart disease Father   . Heart disease Brother   . Diabetes Sister   . Heart disease Brother   . Heart disease Brother   . Heart disease Brother   . Heart disease Brother   . Other Sister        BRAIN TUMOR    Social History   Tobacco Use  . Smoking status: Never Smoker  . Smokeless tobacco: Never Used  Substance Use Topics  .  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 Drug use: No    Current Outpatient Medications  Medication Sig Dispense Refill  . aspirin EC 81 MG tablet Take 81 mg by mouth daily.    . fluticasone (FLONASE) 50 MCG/ACT nasal spray Place 1 spray into both nostrils as needed for allergies.    . furosemide (LASIX) 20 MG tablet Take 20 mg by mouth daily.     [name] levothyroxine (SYNTHROID, LEVOTHROID) 50 MCG tablet Take 50 mcg by mouth daily before breakfast.    . linaclotide (LINZESS) 290 MCG CAPS capsule Take 290 mcg by mouth daily before breakfast.    . metoprolol tartrate (LOPRESSOR) 25 MG tablet Take 25 mg by mouth 2 (two) times daily.    . nitroGLYCERIN  (NITROSTAT) 0.4 MG SL tablet Place 0.4 mg under the tongue every 5 (five) minutes as needed for chest pain.   0  . PROAIR HFA 108 (90 BASE) MCG/ACT inhaler Inhale 2 puffs into the lungs as needed for wheezing or shortness of breath.      No current facility-administered medications for this visit.     Allergies  Allergen Reactions  . Levofloxacin Hives, Shortness Of Breath and Swelling    RASH IN MOUTH   (can take IV route)  . Prednisone Itching  . Statins Other (See Comments)    Myalgias  . Gabapentin Nausea Only    Review of Systems:  Constitutional: Denies fever, chills, diaphoresis, appetite change and fatigue.  HEENT: Denies photophobia, eye pain, redness, hearing loss, ear pain, congestion, sore throat, rhinorrhea, sneezing, mouth sores, neck pain, neck stiffness and tinnitus.   Respiratory: Denies SOB, DOE, cough, chest tightness,  and wheezing.   Cardiovascular: Denies chest pain, palpitations and leg swelling.  Genitourinary: Denies dysuria, urgency, frequency, hematuria, flank pain and difficulty urinating.  Musculoskeletal: Denies myalgias, back pain, joint swelling, arthralgias and gait problem.  Skin: No rash.  Neurological: Denies dizziness, seizures, syncope, weakness, light-headedness, numbness and headaches.  Hematological: Denies adenopathy. Easy bruising, personal or family bleeding history  Psychiatric/Behavioral: No anxiety or depression     Physical Exam:    BP 108/70   Pulse 97   Ht 5' 5.5" (1.664 m)   Wt 162 lb 2 oz (73.5 kg)   BMI 26.57 kg/m  Filed Weights   03/02/18 1501  Weight: 162 lb 2 oz (73.5 kg)   Constitutional:  Well-developed, in no acute distress. Psychiatric: Normal mood and affect. Behavior is normal. HEENT: Pupils normal.  Conjunctivae are normal. No scleral icterus. Neck supple.  Cardiovascular: Normal rate, regular rhythm. No edema Pulmonary/chest: Effort normal and breath sounds normal. No wheezing, rales or rhonchi. Abdominal:  Soft, nondistended. Nontender. Bowel sounds active throughout. There are no masses palpable. No hepatomegaly. Has LAP-BAND reservoir. Rectal:  defered Neurological: Alert and oriented to person place and time. Skin: Skin is warm and dry. No rashes noted.  Data Reviewed: I have personally reviewed following labs and imaging studies  CBC: CBC Latest Ref Rng & Units 01/30/2018 07/30/2015 07/22/2015  WBC 4.0 - 10.5 K/uL 6.6 7.1 6.8  Hemoglobin 12.0 - 15.0 g/dL 13.8 9.6(L) 13.3  Hematocrit 36.0 - 46.0 % 43.9 29.6(L) 40.4  Platelets 150 - 400 K/uL 228 186 220    CMP: CMP Latest Ref Rng & Units 01/30/2018 07/30/2015 07/22/2015  Glucose 70 - 99 mg/dL 98 170(H) 102(H)  BUN 8 - 23 mg/dL 10 12 20   Creatinine  0.44 - 1.00 mg/dL 0.79 0.86 0.84  Sodium 135 - 145 mmol/L 139 139 141  Potassium 3.5 - 5.1 mmol/L 3.8 3.8 4.3  Chloride 98 - 111 mmol/L 104 104 97(L)  CO2 22 - 32 mmol/L 24 26 30   Calcium 8.9 - 10.3 mg/dL 9.7 8.8(L) 10.0  Total Protein 6.5 - 8.1 g/dL 7.9 - -  Total Bilirubin 0.3 - 1.2 mg/dL 0.8 - -  Alkaline Phos 38 - 126 U/L 104 - -  AST 15 - 41 U/L 194(H) - -  ALT 0 - 44 U/L 238(H) - -      Radiology Studies: US Breast Ltd Uni Left Inc Axilla  Result Date: 02/26/2018 CLINICAL DATA:  72 year old patient with recent fairly diffuse left breast pain. She denies any breast lumps. She has recently had left chest pain and was diagnosed with pneumonia and feels like she is getting better. EXAM: DIGITAL DIAGNOSTIC LEFT MAMMOGRAM WITH IMPLANTS, CAD AND TOMO ULTRASOUND LEFT BREAST The patient has retropectoral implants. Standard and implant displaced views were performed. COMPARISON:  Previous exam(s). ACR Breast Density Category b: There are scattered areas of fibroglandular density. FINDINGS: Stable parenchymal pattern in the left breast. No mass, architectural distortion, or suspicious microcalcification is identified to suggest malignancy. A spot tangential view of one of the regions of pain was  performed, and is negative. Targeted ultrasound is performed, showing normal fibroglandular tissue in the regions of patient pain. No mass or fluid collection is identified. No abnormal shadowing. Mammographic images were processed with CAD. IMPRESSION: No evidence of malignancy in the left breast. RECOMMENDATION: Bilateral screening mammogram is recommended in November 2019. I have discussed the findings and recommendations with the patient. Results were also provided in writing at the conclusion of the visit. If applicable, a reminder letter will be sent to the patient regarding the next appointment. BI-RADS CATEGORY  2: Benign. Electronically Signed   By: Curlene Dolphin M.D.   On: 02/26/2018 13:36   Mm Diag Breast W/implant Tomo Uni L  Result Date: 02/26/2018 CLINICAL DATA:  72 year old patient with recent fairly diffuse left breast pain. She denies any breast lumps. She has recently had left chest pain and was diagnosed with pneumonia and feels like she is getting better. EXAM: DIGITAL DIAGNOSTIC LEFT MAMMOGRAM WITH IMPLANTS, CAD AND TOMO ULTRASOUND LEFT BREAST The patient has retropectoral implants. Standard and implant displaced views were performed. COMPARISON:  Previous exam(s). ACR Breast Density Category b: There are scattered areas of fibroglandular density. FINDINGS: Stable parenchymal pattern in the left breast. No mass, architectural distortion, or suspicious microcalcification is identified to suggest malignancy. A spot tangential view of one of the regions of pain was performed, and is negative. Targeted ultrasound is performed, showing normal fibroglandular tissue in the regions of patient pain. No mass or fluid collection is identified. No abnormal shadowing. Mammographic images were processed with CAD. IMPRESSION: No evidence of malignancy in the left breast. RECOMMENDATION: Bilateral screening mammogram is recommended in November 2019. I have discussed the findings and recommendations with the  patient. Results were also provided in writing at the conclusion of the visit. If applicable, a reminder letter will be sent to the patient regarding the next appointment. BI-RADS CATEGORY  2: Benign. Electronically Signed   By: Curlene Dolphin M.D.   On: 02/26/2018 13:36  CT was independently reviewed-mild fatty liver, no acute abnormalities    Carmell Austria, MD 03/02/2018, 3:16 PM  Cc: [name], MD

## 2018-03-02 NOTE — Patient Instructions (Signed)
If you are age 72 or older, your body mass index should be between 23-30. Your Body mass index is 26.57 kg/m. If this is out of the aforementioned range listed, please consider follow up with your Primary Care Provider.  If you are age 71 or younger, your body mass index should be between 19-25. Your Body mass index is 26.57 kg/m. If this is out of the aformentioned range listed, please consider follow up with your Primary Care Provider.   We have given you samples of the following medication to take: Linzess 290 mcg once daily.  Thank you,  Dr. Jackquline Denmark

## 2018-03-05 DIAGNOSIS — J218 Acute bronchiolitis due to other specified organisms: Secondary | ICD-10-CM | POA: Diagnosis not present

## 2018-03-06 DIAGNOSIS — M545 Low back pain: Secondary | ICD-10-CM | POA: Diagnosis not present

## 2018-03-06 DIAGNOSIS — M48062 Spinal stenosis, lumbar region with neurogenic claudication: Secondary | ICD-10-CM | POA: Diagnosis not present

## 2018-03-13 ENCOUNTER — Ambulatory Visit: Payer: Medicare Other | Admitting: Physician Assistant

## 2018-03-20 ENCOUNTER — Other Ambulatory Visit: Payer: Medicare Other

## 2018-03-20 ENCOUNTER — Ambulatory Visit (INDEPENDENT_AMBULATORY_CARE_PROVIDER_SITE_OTHER): Payer: Medicare Other | Admitting: Pulmonary Disease

## 2018-03-20 ENCOUNTER — Other Ambulatory Visit (INDEPENDENT_AMBULATORY_CARE_PROVIDER_SITE_OTHER): Payer: Medicare Other

## 2018-03-20 ENCOUNTER — Encounter: Payer: Self-pay | Admitting: Pulmonary Disease

## 2018-03-20 ENCOUNTER — Telehealth: Payer: Self-pay | Admitting: Pulmonary Disease

## 2018-03-20 DIAGNOSIS — R0602 Shortness of breath: Secondary | ICD-10-CM

## 2018-03-20 LAB — CBC WITH DIFFERENTIAL/PLATELET
BASOS PCT: 1.3 % (ref 0.0–3.0)
Basophils Absolute: 0.1 10*3/uL (ref 0.0–0.1)
EOS PCT: 4.7 % (ref 0.0–5.0)
Eosinophils Absolute: 0.4 10*3/uL (ref 0.0–0.7)
HCT: 42.3 % (ref 36.0–46.0)
HEMOGLOBIN: 14 g/dL (ref 12.0–15.0)
LYMPHS ABS: 1.8 10*3/uL (ref 0.7–4.0)
Lymphocytes Relative: 23.1 % (ref 12.0–46.0)
MCHC: 33.1 g/dL (ref 30.0–36.0)
MCV: 91.9 fl (ref 78.0–100.0)
MONO ABS: 0.7 10*3/uL (ref 0.1–1.0)
Monocytes Relative: 8.7 % (ref 3.0–12.0)
NEUTROS ABS: 5 10*3/uL (ref 1.4–7.7)
Neutrophils Relative %: 62.2 % (ref 43.0–77.0)
Platelets: 252 10*3/uL (ref 150.0–400.0)
RBC: 4.61 Mil/uL (ref 3.87–5.11)
RDW: 13.9 % (ref 11.5–15.5)
WBC: 8 10*3/uL (ref 4.0–10.5)

## 2018-03-20 LAB — NITRIC OXIDE: Nitric Oxide: 24

## 2018-03-20 MED ORDER — FLUTICASONE FUROATE-VILANTEROL 200-25 MCG/INH IN AEPB: 1.0000 | INHALATION_SPRAY | Freq: Every day | RESPIRATORY_TRACT | 0 refills | Status: DC

## 2018-03-20 MED ORDER — FLUTICASONE FUROATE-VILANTEROL 200-25 MCG/INH IN AEPB: 1.0000 | INHALATION_SPRAY | Freq: Every day | RESPIRATORY_TRACT | 5 refills | Status: DC

## 2018-03-20 MED ORDER — ALBUTEROL SULFATE HFA 108 (90 BASE) MCG/ACT IN AERS: 2.0000 | INHALATION_SPRAY | Freq: Four times a day (QID) | RESPIRATORY_TRACT | 3 refills | Status: DC | PRN

## 2018-03-20 NOTE — Telephone Encounter (Signed)
Called and spoke with patient, she stated that there was not a sample given at [facility]. Patient would like a prescription to the pharmacy. Nothing further needed. Prescription sent.

## 2018-03-20 NOTE — Progress Notes (Signed)
Courtney [name]    [id number]    04-09-1946  Primary Care Physician:[name], Elnita Maxwell, MD  Referring Physician: [name], MD [address] [place], [place] [address]  Chief complaint: Consult for recurrent pneumonia, evaluate for bronchiectasis  HPI: 72 year old with history of SVT, hyperlipidemia, allergies, GERD She has episodes of recurrent bronchiectasis, pneumonia.  Treated with antibiotics over the past few months.  Continues to have cough with congestion, wheezing, dyspnea.  Also has significant symptoms of postnasal drip, rhinitis. Sent to pulmonary clinic for evaluation of recurrent bronchitis, suspected bronchiectasis.  Pets: No pets Occupation: Retired Marine scientist Exposures: No known exposures, no mold, hot tub, Jacuzzi Smoking history: Never smoker Travel history: Likes to travel around the country in an RV Relevant family history: Brother and sister have asthma.  Outpatient Encounter Medications as of 03/20/2018  Medication Sig  . aspirin EC 81 MG tablet Take 81 mg by mouth daily.  . fluticasone (FLONASE) 50 MCG/ACT nasal spray Place 1 spray into both nostrils as needed for allergies.  . furosemide (LASIX) 20 MG tablet Take 20 mg by mouth daily.   [name] levothyroxine (SYNTHROID, LEVOTHROID) 50 MCG tablet Take 50 mcg by mouth daily before breakfast.  . linaclotide (LINZESS) 290 MCG CAPS capsule Take 1 capsule (290 mcg total) by mouth daily before breakfast.  . linaclotide (LINZESS) 290 MCG CAPS capsule Take 1 capsule (290 mcg total) by mouth daily before breakfast.  . metoprolol tartrate (LOPRESSOR) 25 MG tablet Take 25 mg by mouth 2 (two) times daily.  . nitroGLYCERIN (NITROSTAT) 0.4 MG SL tablet Place 0.4 mg under the tongue every 5 (five) minutes as needed for chest pain.   [name] PROAIR HFA 108 (90 BASE) MCG/ACT inhaler Inhale 2 puffs into the lungs as needed for wheezing or shortness of breath.   . [DISCONTINUED] linaclotide (LINZESS) 290 MCG CAPS capsule Take  290 mcg by mouth daily before breakfast.   No facility-administered encounter medications on file as of 03/20/2018.     Allergies as of 03/20/2018 - Review Complete 03/20/2018  Allergen Reaction Noted  . Levofloxacin Hives, Shortness Of Breath, and Swelling 07/25/2014  . Prednisone Itching 07/28/2015  . Statins Other (See Comments) 01/22/2013  . Gabapentin Nausea Only 06/10/2015    Past Medical History:  Diagnosis Date  . Anemia   . Arthritis   . Depression, major, recurrent, moderate ([place])   . Heart murmur   . History of bladder infections   . Hyperlipidemia   . Hypertension   . Hypothyroidism   . Knee pain, bilateral   . Osteoarthritis   . Pneumonia   . Recovering alcoholic ([place])   . SVT (supraventricular tachycardia) ([place])   . UTI (urinary tract infection)   . Varicose veins   . Vitamin B12 deficiency     Past Surgical History:  Procedure Laterality Date  . ABDOMINAL HYSTERECTOMY  1991  . ANGIOPLASTY     at least 15 years ago  . APPENDECTOMY    . AUGMENTATION MAMMAPLASTY Bilateral   . BACK SURGERY     had 2 surgeries. Have rods placed in back   . BARIATRIC SURGERY     lap band-at least 14 years ago  . BREAST BIOPSY Left    x2  . BREAST ENHANCEMENT SURGERY  2006  . COLONOSCOPY  12/01/2016   Moderate predominantly sigmoid diverticulosis.   [name] KNEE ARTHROSCOPY    . lap band surgery  1981  . LUMBAR FUSION  07/29/2015   posterior level one  . removal of cervical disc fragments  10-2007    Family History  Problem Relation Age of Onset  . Heart disease Mother   . Heart disease Father   . Heart disease Brother   . Diabetes Sister   . Heart disease Brother   . Heart disease Brother   . Heart disease Brother   . Heart disease Brother   . Other Sister        BRAIN TUMOR    Social History   Socioeconomic History  . Marital status: Married    Spouse name: Not on file  . Number of children: 2  . Years of education: Not on file  . Highest education  level: Not on file  Occupational History  . Not on file  Social Needs  . Financial resource strain: Not on file  . Food insecurity:    Worry: Not on file    Inability: Not on file  . Transportation needs:    Medical: Not on file    Non-medical: Not on file  Tobacco Use  . Smoking status: Never Smoker  . Smokeless tobacco: Never Used  Substance and Sexual Activity  . 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 Drug use: No  . Sexual activity: Not on file    Comment: MARRIED  Lifestyle  . Physical activity:    Days per week: Not on file    Minutes per session: Not on file  . Stress: Not on file  Relationships  . Social connections:    Talks on phone: Not on file    Gets together: Not on file    Attends religious service: Not on file    Active member of club or organization: Not on file    Attends meetings of clubs or organizations: Not on file    Relationship status: Not on file  . Intimate partner violence:    Fear of current or ex partner: Not on file    Emotionally abused: Not on file    Physically abused: Not on file    Forced sexual activity: Not on file  Other Topics Concern  . Not on file  Social History Narrative  . Not on file    Review of systems: Review of Systems  Constitutional: Negative for fever and chills.  HENT: Negative.   Eyes: Negative for blurred vision.  Respiratory: as per HPI  Cardiovascular: Negative for chest pain and palpitations.  Gastrointestinal: Negative for vomiting, diarrhea, blood per rectum. Genitourinary: Negative for dysuria, urgency, frequency and hematuria.  Musculoskeletal: Negative for myalgias, back pain and joint pain.  Skin: Negative for itching and rash.  Neurological: Negative for dizziness, tremors, focal weakness, seizures and loss of consciousness.  Endo/Heme/Allergies: Negative for environmental allergies.  Psychiatric/Behavioral: Negative for depression, suicidal ideas  and hallucinations.  All other systems reviewed and are negative.  Physical Exam: Blood pressure 138/76, pulse 76, height 5\' 5"  (1.651 m), weight 163 lb 6.4 oz (74.1 kg), SpO2 98 %. Gen:      No acute distress HEENT:  EOMI, sclera anicteric Neck:     No masses; no thyromegaly Lungs:    Scattered expiratory wheeze, crackle. CV:         Regular rate and rhythm; no murmurs Abd:      + bowel sounds; soft, non-tender; no palpable masses, no distension Ext:    No edema; adequate peripheral  perfusion Skin:      Warm and dry; no rash Neuro: alert and oriented x 3 Psych: normal mood and affect  Data Reviewed: Imaging: CT angio 01/30/18- patchy multifocal groundglass opacities, nodular densities bilaterally left greater than right.  Patulous esophagus with mild thickening.  I have reviewed the images personally.  Imaging from primary care Chest x-ray March 07, 2018, new infiltrate in the lingula superimposed on chronic scarring.  PFTs: FENO 03/20/2018-24  Labs: CBC from primary care 03/05/2018-WBC 7, granulocytes 67%, lymphocytes 26%.  Assessment:  Evaluation for recurrent bronchitis, pneumonias Images reviewed with recent CT scan showing patchy nodular opacities and groundglass.  She will need evaluation for underlying interstitial lung disease and bronchiectasis We will get a high-resolution CT, pulmonary function tests Check CBC with differential, IgE levels  On examination today she has mild wheeze possibly reactive airway disease We will start Breo inhaler and albuterol rescue inhaler. History noted for gastric lap band, GERD and esophagitis on recent CT scan.  She may have esophageal dysfunction and recurrent aspiration.  If the above work-up is negative then we can consider barium swallow  Cough, postnasal drip Start chlorpheniramine, continue Flonase nasal spray  Plan/Recommendations: - CBC differential, IgE, PFTs, high res CT chest - Breo, albuterol rescue inhaler -  Chlorpheniramine, Flonase  [name] MD [facility] 03/20/2018, 12:12 PM  CC: [name], MD

## 2018-03-20 NOTE — Patient Instructions (Signed)
We will check a FENO Check CBC differential, IgE levels Use chlorpheniramine over-the-counter 8 mg 3 times daily Continue Flonase nasal spray We will start Breo 200 and albuterol rescue inhaler  We will schedule you for pulmonary function testing and high-res CT of the chest Follow-up in 1 to 2 months.

## 2018-03-21 ENCOUNTER — Ambulatory Visit: Payer: Medicare Other | Admitting: Pulmonary Disease

## 2018-03-21 DIAGNOSIS — R0602 Shortness of breath: Secondary | ICD-10-CM

## 2018-03-21 LAB — PULMONARY FUNCTION TEST
DL/VA % PRED: 104 %
DL/VA: 4.91 ml/min/mmHg/L
DLCO cor % pred: 86 %
DLCO cor: 19.71 ml/min/mmHg
DLCO unc % pred: 87 %
DLCO unc: 20.06 ml/min/mmHg
FEF 25-75 POST: 3.77 L/s
FEF 25-75 Pre: 3.5 L/sec
FEF2575-%CHANGE-POST: 7 %
FEF2575-%PRED-PRE: 200 %
FEF2575-%Pred-Post: 215 %
FEV1-%Change-Post: 0 %
FEV1-%PRED-PRE: 107 %
FEV1-%Pred-Post: 108 %
FEV1-Post: 2.29 L
FEV1-Pre: 2.27 L
FEV1FVC-%CHANGE-POST: 1 %
FEV1FVC-%Pred-Pre: 117 %
FEV6-%CHANGE-POST: 0 %
FEV6-%Pred-Post: 94 %
FEV6-%Pred-Pre: 95 %
FEV6-PRE: 2.54 L
FEV6-Post: 2.53 L
FEV6FVC-%PRED-PRE: 105 %
FEV6FVC-%Pred-Post: 105 %
FVC-%Change-Post: 0 %
FVC-%PRED-POST: 90 %
FVC-%PRED-PRE: 90 %
FVC-POST: 2.53 L
FVC-PRE: 2.54 L
POST FEV1/FVC RATIO: 90 %
Post FEV6/FVC ratio: 100 %
Pre FEV1/FVC ratio: 89 %
Pre FEV6/FVC Ratio: 100 %
RV % pred: 69 %
RV: 1.51 L
TLC % pred: 85 %
TLC: 4.17 L

## 2018-03-21 LAB — IGE: IGE (IMMUNOGLOBULIN E), SERUM: 98 kU/L (ref <=114)

## 2018-03-26 DIAGNOSIS — H65191 Other acute nonsuppurative otitis media, right ear: Secondary | ICD-10-CM | POA: Diagnosis not present

## 2018-03-26 DIAGNOSIS — J470 Bronchiectasis with acute lower respiratory infection: Secondary | ICD-10-CM | POA: Diagnosis not present

## 2018-03-26 DIAGNOSIS — E782 Mixed hyperlipidemia: Secondary | ICD-10-CM | POA: Diagnosis not present

## 2018-03-26 DIAGNOSIS — F5101 Primary insomnia: Secondary | ICD-10-CM | POA: Diagnosis not present

## 2018-03-26 DIAGNOSIS — I498 Other specified cardiac arrhythmias: Secondary | ICD-10-CM | POA: Diagnosis not present

## 2018-03-26 DIAGNOSIS — R740 Nonspecific elevation of levels of transaminase and lactic acid dehydrogenase [LDH]: Secondary | ICD-10-CM | POA: Diagnosis not present

## 2018-03-26 DIAGNOSIS — Z23 Encounter for immunization: Secondary | ICD-10-CM | POA: Diagnosis not present

## 2018-03-26 DIAGNOSIS — J018 Other acute sinusitis: Secondary | ICD-10-CM | POA: Diagnosis not present

## 2018-03-26 DIAGNOSIS — E1121 Type 2 diabetes mellitus with diabetic nephropathy: Secondary | ICD-10-CM | POA: Diagnosis not present

## 2018-03-26 DIAGNOSIS — R7301 Impaired fasting glucose: Secondary | ICD-10-CM | POA: Diagnosis not present

## 2018-03-26 DIAGNOSIS — E038 Other specified hypothyroidism: Secondary | ICD-10-CM | POA: Diagnosis not present

## 2018-04-02 ENCOUNTER — Ambulatory Visit (INDEPENDENT_AMBULATORY_CARE_PROVIDER_SITE_OTHER)
Admission: RE | Admit: 2018-04-02 | Discharge: 2018-04-02 | Disposition: A | Discharge status: Home / Self Care | Payer: Medicare Other | Source: Ambulatory Visit | Attending: Pulmonary Disease | Admitting: Pulmonary Disease

## 2018-04-02 DIAGNOSIS — J028 Acute pharyngitis due to other specified organisms: Secondary | ICD-10-CM | POA: Diagnosis not present

## 2018-04-02 DIAGNOSIS — J189 Pneumonia, unspecified organism: Secondary | ICD-10-CM | POA: Diagnosis not present

## 2018-04-02 DIAGNOSIS — R0602 Shortness of breath: Secondary | ICD-10-CM | POA: Diagnosis not present

## 2018-04-02 DIAGNOSIS — M7918 Myalgia, other site: Secondary | ICD-10-CM | POA: Diagnosis not present

## 2018-04-05 DIAGNOSIS — M5137 Other intervertebral disc degeneration, lumbosacral region: Secondary | ICD-10-CM | POA: Diagnosis not present

## 2018-04-05 DIAGNOSIS — M48061 Spinal stenosis, lumbar region without neurogenic claudication: Secondary | ICD-10-CM | POA: Diagnosis not present

## 2018-04-05 DIAGNOSIS — M533 Sacrococcygeal disorders, not elsewhere classified: Secondary | ICD-10-CM | POA: Diagnosis not present

## 2018-04-09 ENCOUNTER — Telehealth: Payer: Self-pay | Admitting: Pulmonary Disease

## 2018-04-09 DIAGNOSIS — M7989 Other specified soft tissue disorders: Secondary | ICD-10-CM | POA: Insufficient documentation

## 2018-04-09 DIAGNOSIS — Z8249 Family history of ischemic heart disease and other diseases of the circulatory system: Secondary | ICD-10-CM | POA: Insufficient documentation

## 2018-04-09 DIAGNOSIS — J479 Bronchiectasis, uncomplicated: Secondary | ICD-10-CM

## 2018-04-09 DIAGNOSIS — R0602 Shortness of breath: Secondary | ICD-10-CM

## 2018-04-09 NOTE — Telephone Encounter (Addendum)
CT shows mild inflammation and bronchiectasis. In addition there is atherosclerosis of the coronary arteries.  Can you ask her to get some labs including ANA, rheumatoid factor, CCP, cystic fibrosis panel, alpha-1 antitrypsin levels and phenotype, hypersensitivity panel, quantitative immunoglobulin, sputum for AFB, fungus, regular cultures Mail the ILD questionnaire We will review these tests and PFTs in detail at return visit.

## 2018-04-09 NOTE — Telephone Encounter (Signed)
Pt is aware of below results and voiced her understanding.  Labs have been ordered.  ILD questionnaire has been mailed to address on file.  Nothing further is needed.

## 2018-04-09 NOTE — Telephone Encounter (Signed)
Called and spoke to pt, who is requesting CT results.   Dr. Vaughan Browner please advise. Thanks

## 2018-04-09 NOTE — Progress Notes (Deleted)
Cardiology Office Note    Date:  04/09/2018   ID:  [name], DOB 10-29-1945, MRN [id number]  PCP:  [name], MD  Cardiologist: Larae Grooms, MD EPS: None  No chief complaint on file.   History of Present Illness:  Courtney [name] is a 72 y.o. female history of chest pain with normal cardiac catheterization 2001, ETT negative for ischemia 2012 Holter monitor 2015 occasional PACs rare PVCs family history of CAD with father having an MI at 43 and several brothers with CABG, history of depression and gastric lap band..  Saw Dr. Irish Lack 09/27/2017 felt to be atypical pain not exertional related and very infrequent.  Also having bilateral leg pain thought to be neurological.  Diet and exercise recommended.  GXT ordered but never done.  Patient called in in August complaining of swelling in her legs and ankles and dyspnea on exertion.  She was taking Lasix 20 mg daily.  Also found out to have pneumonia appointment made.  Has elevated LFTs followed by Dr. Lyndel Safe.  Saw pulmonary 03/20/2018 for recurrent bronchitis and pneumonia.  CT scan shows patchy nodular opacities and groundglass.    Recommended high sleep resolution CT.This did show atherosclerotic calcification of the arterial vasculature including coronary arteries. and PFTs.  The PFTs were normal.  Past Medical History:  Diagnosis Date  . Anemia   . Arthritis   . Depression, major, recurrent, moderate ([place])   . Heart murmur   . History of bladder infections   . Hyperlipidemia   . Hypertension   . Hypothyroidism   . Knee pain, bilateral   . Osteoarthritis   . Pneumonia   . Recovering alcoholic ([place])   . SVT (supraventricular tachycardia) ([place])   . UTI (urinary tract infection)   . Varicose veins   . Vitamin B12 deficiency     Past Surgical History:  Procedure Laterality Date  . ABDOMINAL HYSTERECTOMY  1991  . ANGIOPLASTY     at least 15 years ago  . APPENDECTOMY    . AUGMENTATION MAMMAPLASTY Bilateral   .  BACK SURGERY     had 2 surgeries. Have rods placed in back   . BARIATRIC SURGERY     lap band-at least 14 years ago  . BREAST BIOPSY Left    x2  . BREAST ENHANCEMENT SURGERY  2006  . COLONOSCOPY  12/01/2016   Moderate predominantly sigmoid diverticulosis.   [name] KNEE ARTHROSCOPY    . lap band surgery  1981  . LUMBAR FUSION  07/29/2015   posterior level one  . removal of cervical disc fragments  10-2007    Current Medications: No outpatient medications have been marked as taking for the 04/10/18 encounter (Appointment) with [name], PA-C.     Allergies:   Levofloxacin; Prednisone; Statins; and Gabapentin   Social History   Socioeconomic History  . Marital status: Married    Spouse name: Not on file  . Number of children: 2  . Years of education: Not on file  . Highest education level: Not on file  Occupational History  . Not on file  Social Needs  . Financial resource strain: Not on file  . Food insecurity:    Worry: Not on file    Inability: Not on file  . Transportation needs:    Medical: Not on file    Non-medical: Not on file  Tobacco Use  . Smoking status: Never Smoker  . Smokeless tobacco: Never Used  Substance and Sexual Activity  .  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 Drug use: No  . Sexual activity: Not on file    Comment: MARRIED  Lifestyle  . Physical activity:    Days per week: Not on file    Minutes per session: Not on file  . Stress: Not on file  Relationships  . Social connections:    Talks on phone: Not on file    Gets together: Not on file    Attends religious service: Not on file    Active member of club or organization: Not on file    Attends meetings of clubs or organizations: Not on file    Relationship status: Not on file  Other Topics Concern  . Not on file  Social History Narrative  . Not on file     Family History:  The patient's ***family history includes Diabetes in  her sister; Heart disease in her brother, brother, brother, brother, brother, father, and mother; Other in her sister.   ROS:   Please see the history of present illness.    ROS All other systems reviewed and are negative.   PHYSICAL EXAM:   VS:  There were no vitals taken for this visit.  Physical Exam  GEN: Well nourished, well developed, in no acute distress  HEENT: normal  Neck: no JVD, carotid bruits, or masses Cardiac:RRR; no murmurs, rubs, or gallops  Respiratory:  clear to auscultation bilaterally, normal work of breathing GI: soft, nontender, nondistended, + BS Ext: without cyanosis, clubbing, or edema, Good distal pulses bilaterally MS: no deformity or atrophy  Skin: warm and dry, no rash Neuro:  Alert and Oriented x 3, Strength and sensation are intact Psych: euthymic mood, full affect  Wt Readings from Last 3 Encounters:  03/20/18 163 lb 6.4 oz (74.1 kg)  03/02/18 162 lb 2 oz (73.5 kg)  09/27/17 178 lb (80.7 kg)      Studies/Labs Reviewed:   EKG:  EKG is*** ordered today.  The ekg ordered today demonstrates ***  Recent Labs: 01/30/2018: ALT 238; BUN 10; Creatinine, Ser 0.79; Potassium 3.8; Sodium 139 03/20/2018: Hemoglobin 14.0; Platelets 252.0   Lipid Panel    Component Value Date/Time   CHOL (H) 09/20/2009 2300    213        ATP III CLASSIFICATION:  <200     mg/dL   Desirable  200-239  mg/dL   Borderline High  >=240    mg/dL   High          TRIG 81 09/20/2009 2300   HDL 51 09/20/2009 2300   CHOLHDL 4.2 09/20/2009 2300   VLDL 16 09/20/2009 2300   LDLCALC (H) 09/20/2009 2300    146        Total Cholesterol/HDL:CHD Risk Coronary Heart Disease Risk Table                     Men   Women  1/2 Average Risk   3.4   3.3  Average Risk       5.0   4.4  2 X Average Risk   9.6   7.1  3 X Average Risk  23.4   11.0        Use the calculated Patient Ratio above and the CHD Risk Table to determine the patient's CHD Risk.        ATP III CLASSIFICATION  (LDL):  <100     mg/dL  Optimal  100-129  mg/dL   Near or Above                    Optimal  130-159  mg/dL   Borderline  160-189  mg/dL   High  >190     mg/dL   Very High    Additional studies/ records that were reviewed today include:  CT of the chest high resolution 04/02/2018  IMPRESSION: 1. Mid and lower lung zone predominant patchy ground-glass, bronchiectasis and nodularity are nonspecific and may be post infectious or post inflammatory in etiology. With the presence of air trapping, chronic hypersensitivity pneumonitis cannot be excluded. 2. Aortic atherosclerosis (ICD10-170.0). Coronary artery calcification. FINDINGS: Cardiovascular: Atherosclerotic calcification of the arterial vasculature, including coronary arteries. Heart size normal. No pericardial effusion.   Per the record from Dr. Philbert Riser: Cardiac work up: - 04/2011: ETT negative for ischemia - 03/2013: ESE achieved 92% MPHR, 11 METS, no symptoms and negative - 12/2013: Holter with SR and occasional PACs, rare PVCs. No symptoms in diary     ASSESSMENT:    1. Chest wall pain   2. Leg swelling   3. Family history of early CAD      PLAN:  In order of problems listed above:  History of atypical chest pain with normal cardiac cath in 2011 and GXT's since then.  Repeat GXT ordered in April but never done.  Recent leg swelling and dyspnea on exertion  Family history of early CAD    Medication Adjustments/Labs and Tests Ordered: Current medicines are reviewed at length with the patient today.  Concerns regarding medicines are outlined above.  Medication changes, Labs and Tests ordered today are listed in the Patient Instructions below. There are no Patient Instructions on file for this visit.   Sumner Boast, PA-C  04/09/2018 3:09 PM    [facility] [address], [place], [place]  [address] Phone: [phone]; Fax: [phone]

## 2018-04-10 ENCOUNTER — Ambulatory Visit: Payer: Medicare Other | Admitting: Physician Assistant

## 2018-04-10 ENCOUNTER — Telehealth: Payer: Self-pay | Admitting: Pulmonary Disease

## 2018-04-10 ENCOUNTER — Other Ambulatory Visit: Payer: Medicare Other

## 2018-04-10 DIAGNOSIS — J479 Bronchiectasis, uncomplicated: Secondary | ICD-10-CM

## 2018-04-10 MED ORDER — AMOXICILLIN-POT CLAVULANATE 875-125 MG PO TABS: 1.0000 | ORAL_TABLET | Freq: Two times a day (BID) | ORAL | 0 refills | Status: DC

## 2018-04-10 MED ORDER — FLUTTER DEVI: 0 refills | Status: DC

## 2018-04-10 NOTE — Telephone Encounter (Signed)
Demo for flutter device has been provided for pt.  Nothing further is needed.

## 2018-04-10 NOTE — Telephone Encounter (Signed)
Pt is calling back [phone]

## 2018-04-10 NOTE — Telephone Encounter (Signed)
Called and spoke to pt.  Pt is aware of below recommendations and voiced her understanding.  sputum cultures were collected today per our records. Pt will come by our office and pick up flutter device, as it had been placed up front for pick up. Rx for Augmentin has been sent to preferred pharmacy. Nothing further is needed.

## 2018-04-10 NOTE — Telephone Encounter (Addendum)
Pt in office to discuss sx. Pt states since receiving CT results yesterday, she did some research and feels that a smart vest may be a good option for her.  Pt feels that an abx is needed currently, as she has a prod cough with yellow to green mucus mixed with light pink blood in the morning x2 weeks. Pt also reports of increased fatigue, wheezing, headache and night sweats. Pt using albuterol daily with mild improvement. Denied additional symptoms. Preferred pharmacy is [facility]. Pt has pending OV for 04/20/18. Pt declined sooner apt, as she prefers to see Dr. Vaughan Browner.   Dr. Vaughan Browner please advise. Thanks

## 2018-04-10 NOTE — Telephone Encounter (Signed)
Left message for patient to call back  

## 2018-04-10 NOTE — Telephone Encounter (Signed)
Accidentally hit complete out of triage box on the wrong message.  Routed message back to triage pool.

## 2018-04-10 NOTE — Telephone Encounter (Signed)
Insurance will not cover smart vest until she tries a flutter device  Please give flutter device and Augmentin 875 mg bid Make sure she gives sputum for cultures, AFB and fungus before staring the antibiotics.

## 2018-04-12 DIAGNOSIS — R0600 Dyspnea, unspecified: Secondary | ICD-10-CM | POA: Insufficient documentation

## 2018-04-12 DIAGNOSIS — E7800 Pure hypercholesterolemia, unspecified: Secondary | ICD-10-CM | POA: Diagnosis not present

## 2018-04-12 DIAGNOSIS — I10 Essential (primary) hypertension: Secondary | ICD-10-CM | POA: Diagnosis not present

## 2018-04-12 DIAGNOSIS — R002 Palpitations: Secondary | ICD-10-CM | POA: Diagnosis not present

## 2018-04-12 DIAGNOSIS — R931 Abnormal findings on diagnostic imaging of heart and coronary circulation: Secondary | ICD-10-CM | POA: Diagnosis not present

## 2018-04-12 DIAGNOSIS — R9431 Abnormal electrocardiogram [ECG] [EKG]: Secondary | ICD-10-CM | POA: Diagnosis not present

## 2018-04-12 DIAGNOSIS — Z8249 Family history of ischemic heart disease and other diseases of the circulatory system: Secondary | ICD-10-CM | POA: Diagnosis not present

## 2018-04-12 DIAGNOSIS — I350 Nonrheumatic aortic (valve) stenosis: Secondary | ICD-10-CM | POA: Diagnosis not present

## 2018-04-12 DIAGNOSIS — I491 Atrial premature depolarization: Secondary | ICD-10-CM | POA: Diagnosis not present

## 2018-04-12 DIAGNOSIS — E785 Hyperlipidemia, unspecified: Secondary | ICD-10-CM | POA: Diagnosis not present

## 2018-04-12 DIAGNOSIS — R0609 Other forms of dyspnea: Secondary | ICD-10-CM | POA: Insufficient documentation

## 2018-04-12 DIAGNOSIS — R0602 Shortness of breath: Secondary | ICD-10-CM | POA: Diagnosis not present

## 2018-04-12 DIAGNOSIS — Z79899 Other long term (current) drug therapy: Secondary | ICD-10-CM | POA: Diagnosis not present

## 2018-04-12 DIAGNOSIS — Z7982 Long term (current) use of aspirin: Secondary | ICD-10-CM | POA: Diagnosis not present

## 2018-04-14 LAB — RESPIRATORY CULTURE OR RESPIRATORY AND SPUTUM CULTURE: MICRO NUMBER:: 91268792

## 2018-04-14 LAB — ANA,IFA RA DIAG PNL W/RFLX TIT/PATN
Anti Nuclear Antibody(ANA): NEGATIVE
Cyclic Citrullin Peptide Ab: <16 UNITS
Rhuematoid fact SerPl-aCnc: <14 IU/mL (ref <14)

## 2018-04-14 LAB — IGG, IGA, IGM
IGG (IMMUNOGLOBIN G), SERUM: 963 mg/dL (ref 600–1540)
IGM, SERUM: 142 mg/dL (ref 50–300)
IMMUNOGLOBULIN A: 154 mg/dL (ref 20–320)

## 2018-04-14 LAB — ALPHA-1 ANTITRYPSIN PHENOTYPE: A-1 Antitrypsin, Ser: 101 mg/dL (ref 83–199)

## 2018-04-17 LAB — CYSTIC FIBROSIS MUTATION 97: Interpretation: NOT DETECTED

## 2018-04-17 LAB — HYPERSENSITIVITY PNEUMONITIS
A. PULLULANS ABS: NEGATIVE
A.Fumigatus #1 Abs: NEGATIVE
MICROPOLYSPORA FAENI IGG: NEGATIVE
PIGEON SERUM ABS: NEGATIVE
THERMOACT. SACCHARII: NEGATIVE
THERMOACTINOMYCES VULGARIS IGG: NEGATIVE

## 2018-04-20 ENCOUNTER — Encounter: Payer: Self-pay | Admitting: Pulmonary Disease

## 2018-04-20 ENCOUNTER — Ambulatory Visit (INDEPENDENT_AMBULATORY_CARE_PROVIDER_SITE_OTHER): Payer: Medicare Other | Admitting: Pulmonary Disease

## 2018-04-20 ENCOUNTER — Telehealth: Payer: Self-pay | Admitting: Pulmonary Disease

## 2018-04-20 VITALS — BP 122/82 | HR 66 | Ht 65.0 in | Wt 168.6 lb

## 2018-04-20 DIAGNOSIS — J479 Bronchiectasis, uncomplicated: Secondary | ICD-10-CM

## 2018-04-20 NOTE — Telephone Encounter (Signed)
Lm for triage nurse at Dr. Janace Aris office to obtain PNA vaccine date. Will await call back

## 2018-04-20 NOTE — Progress Notes (Addendum)
Courtney [name]    [id number]    Nov 26, 1945  Primary Care Physician:[name], Elnita Maxwell, MD  Referring Physician: [name], MD [address] [place], [place] [address]  Chief complaint: Follow-up for recurrent pneumonia, bronchiectasis  HPI: 72 year old with history of SVT, hyperlipidemia, allergies, GERD She has episodes of recurrent bronchiectasis, pneumonia.  Treated with antibiotics over the past few months.  Continues to have cough with congestion, wheezing, dyspnea.  Also has significant symptoms of postnasal drip, rhinitis. Sent to pulmonary clinic for evaluation of recurrent bronchitis, suspected bronchiectasis.  Pets: No pets Occupation: Retired Marine scientist Exposures: No known exposures, no mold, hot tub, Jacuzzi Smoking history: Never smoker Travel history: Likes to travel around the country in an RV Relevant family history: Brother and sister have asthma.  Interim history: Given a prescription for Augmentin at last visit due to bronchitis.  She has been using flutter valve for over a month with no improvement in cough and congestion Continues to have daily symptoms of cough with yellow mucus, occasional wheezing and dyspnea.  Denies fevers, chills.  Outpatient Encounter Medications as of 04/20/2018  Medication Sig  . albuterol (PROAIR HFA) 108 (90 Base) MCG/ACT inhaler Inhale 2 puffs into the lungs every 6 (six) hours as needed for wheezing or shortness of breath.  [name] aspirin EC 81 MG tablet Take 81 mg by mouth daily.  . fluticasone (FLONASE) 50 MCG/ACT nasal spray Place 1 spray into both nostrils as needed for allergies.  . fluticasone furoate-vilanterol (BREO ELLIPTA) 200-25 MCG/INH AEPB Inhale 1 puff into the lungs daily.  . furosemide (LASIX) 20 MG tablet Take 20 mg by mouth daily.   [name] levothyroxine (SYNTHROID, LEVOTHROID) 50 MCG tablet Take 50 mcg by mouth daily before breakfast.  . linaclotide (LINZESS) 290 MCG CAPS capsule Take 1 capsule (290 mcg  total) by mouth daily before breakfast.  . linaclotide (LINZESS) 290 MCG CAPS capsule Take 1 capsule (290 mcg total) by mouth daily before breakfast.  . metoprolol tartrate (LOPRESSOR) 25 MG tablet Take 25 mg by mouth 2 (two) times daily.  . nitroGLYCERIN (NITROSTAT) 0.4 MG SL tablet Place 0.4 mg under the tongue every 5 (five) minutes as needed for chest pain.   [name] PROAIR HFA 108 (90 BASE) MCG/ACT inhaler Inhale 2 puffs into the lungs as needed for wheezing or shortness of breath.   [name] Respiratory Therapy Supplies (FLUTTER) DEVI Use as directed.  . [DISCONTINUED] amoxicillin-clavulanate (AUGMENTIN) 875-125 MG tablet Take 1 tablet by mouth 2 (two) times daily.   No facility-administered encounter medications on file as of 04/20/2018.    Physical Exam: Blood pressure 122/82, pulse 66, height 5\' 5"  (1.651 m), weight 168 lb 9.6 oz (76.5 kg), SpO2 99 %. Gen:      No acute distress HEENT:  EOMI, sclera anicteric Neck:     No masses; no thyromegaly Lungs:    Clear to auscultation bilaterally; normal respiratory effort CV:         Regular rate and rhythm; no murmurs Abd:      + bowel sounds; soft, non-tender; no palpable masses, no distension Ext:    No edema; adequate peripheral perfusion Skin:      Warm and dry; no rash Neuro: alert and oriented x 3 Psych: normal mood and affect  Data Reviewed: Imaging: CT angio 01/30/18- patchy multifocal groundglass opacities, nodular densities bilaterally left greater than right.  Patulous esophagus with mild thickening.  I have reviewed the images personally.  High-resolution CT 04/02/2018- mid and lower lung groundglass opacities, mild bronchiectasis and nodularity.  Aortic atherosclerosis, coronary artery calcification I reviewed the images personally.  Imaging from primary care Chest x-ray March 07, 2018, new infiltrate in the lingula superimposed on chronic scarring.  PFTs: 03/21/2018 FVC 2.53 [90%), FEV1 2.29 [99%), F/F 90, TLC 85%, DLCO  87% Normal test  FENO 03/20/2018-24  Labs: CBC from primary care 03/05/2018-WBC 7, granulocytes 67%, lymphocytes 26%. CBC 03/20/1989-WBC 8, eos 4.7%, absolute eosinophil count 376  Bronchiectasis work up 04/10/2018 Sputum culture -negative for mycobacteria, regular cultures, fungus cultures IgG, IgA, IgM-normal Alpha-1 antitrypsin-101, PIMZ IgE- 98  Assessment:  Recurrent bronchitis, bronchiectasis Images reviewed with recent CT scan showing patchy nodular opacities and groundglass, bronchiectasis Work-up as noted above was negative.  No evidence of MAI in sputum  Continue Breo inhaler.  She notes jitteriness after using it.  He continues to have the symptoms and we can consider alternative medication on return Pt has had prod cough greater then 6 months and failed flutter device requiring percussion vest. Since flutter valve is not effective when mucociliary clearance will start paperwork to get a percussion vest Advised to use Mucinex, stay well-hydrated  GERD, ? aspiration History noted for gastric lap band, GERD and esophagitis on recent CT scan.  She may have esophageal dysfunction and recurrent aspiration.   Follows with Dr. Lyndel Safe, GI.  She is planning to follow up with her surgeon to get her gastric lap band removed Consider barium swallow if she continues to have symptoms  Cough, postnasal drip Continue chlorphentermine, Flonase nasal spray  Plan/Recommendations: - Continue Breo - Muco ciliary clearance with percussion vest, Mucinex  [name] MD [facility] 04/20/2018, 9:44 AM  CC: [name], MD

## 2018-04-20 NOTE — Patient Instructions (Signed)
Continue with the Breo inhaler If you continue to have issues with this at return visit then we can consider changing it to a different medication  We will start the paperwork to give you a percussion vest Use Mucinex for mucus clearance Stay well-hydrated  Follow-up in 3 months.

## 2018-04-24 ENCOUNTER — Encounter (HOSPITAL_COMMUNITY): Payer: Self-pay

## 2018-04-25 DIAGNOSIS — E039 Hypothyroidism, unspecified: Secondary | ICD-10-CM | POA: Diagnosis not present

## 2018-04-25 DIAGNOSIS — E7800 Pure hypercholesterolemia, unspecified: Secondary | ICD-10-CM | POA: Diagnosis not present

## 2018-04-25 DIAGNOSIS — T466X5D Adverse effect of antihyperlipidemic and antiarteriosclerotic drugs, subsequent encounter: Secondary | ICD-10-CM | POA: Diagnosis not present

## 2018-04-25 DIAGNOSIS — E7801 Familial hypercholesterolemia: Secondary | ICD-10-CM | POA: Diagnosis not present

## 2018-04-25 DIAGNOSIS — I2584 Coronary atherosclerosis due to calcified coronary lesion: Secondary | ICD-10-CM | POA: Diagnosis not present

## 2018-04-25 DIAGNOSIS — I10 Essential (primary) hypertension: Secondary | ICD-10-CM | POA: Diagnosis not present

## 2018-04-25 DIAGNOSIS — Z8249 Family history of ischemic heart disease and other diseases of the circulatory system: Secondary | ICD-10-CM | POA: Diagnosis not present

## 2018-04-25 DIAGNOSIS — I2510 Atherosclerotic heart disease of native coronary artery without angina pectoris: Secondary | ICD-10-CM | POA: Diagnosis not present

## 2018-04-25 DIAGNOSIS — T466X5A Adverse effect of antihyperlipidemic and antiarteriosclerotic drugs, initial encounter: Secondary | ICD-10-CM | POA: Diagnosis not present

## 2018-04-27 NOTE — Telephone Encounter (Signed)
Records have not been received as of yet.  Lm with Dr. Janace Aris office.

## 2018-04-30 NOTE — Telephone Encounter (Signed)
Spoke to [place] with [employer] and made him aware of below message.

## 2018-04-30 NOTE — Telephone Encounter (Signed)
Done

## 2018-04-30 NOTE — Telephone Encounter (Signed)
Per Jeneen Rinks with [facility]- in order for insurance to cover percussion vest, 04/20/18 office note needs to state that pt has had prod cough greater then 6 months and failed flutter device requiring percussion vest.  Dr. Vaughan Browner please advise. Thanks

## 2018-05-01 NOTE — Telephone Encounter (Signed)
Received call from Jeneen Rinks with [facility], who states that [facility] has been unable to reach pt regarding vest. I have spoken to pt and provided her with [facility]'s contact number to call.  Nothing further is needed.

## 2018-05-23 DIAGNOSIS — R1011 Right upper quadrant pain: Secondary | ICD-10-CM | POA: Diagnosis not present

## 2018-05-23 DIAGNOSIS — R110 Nausea: Secondary | ICD-10-CM | POA: Diagnosis not present

## 2018-05-23 DIAGNOSIS — Z9884 Bariatric surgery status: Secondary | ICD-10-CM | POA: Diagnosis not present

## 2018-05-24 DIAGNOSIS — H4303 Vitreous prolapse, bilateral: Secondary | ICD-10-CM | POA: Diagnosis not present

## 2018-05-29 LAB — MYCOBACTERIA,CULT W/FLUOROCHROME SMEAR
MICRO NUMBER:: 91268790
SMEAR:: NONE SEEN
SPECIMEN QUALITY:: ADEQUATE

## 2018-05-29 LAB — FUNGUS CULTURE W SMEAR
MICRO NUMBER:: 91268789
SMEAR: NONE SEEN
SPECIMEN QUALITY:: ADEQUATE

## 2018-06-01 DIAGNOSIS — K8010 Calculus of gallbladder with chronic cholecystitis without obstruction: Secondary | ICD-10-CM | POA: Diagnosis not present

## 2018-06-01 DIAGNOSIS — J018 Other acute sinusitis: Secondary | ICD-10-CM | POA: Diagnosis not present

## 2018-06-01 DIAGNOSIS — J470 Bronchiectasis with acute lower respiratory infection: Secondary | ICD-10-CM | POA: Diagnosis not present

## 2018-06-01 DIAGNOSIS — F331 Major depressive disorder, recurrent, moderate: Secondary | ICD-10-CM | POA: Diagnosis not present

## 2018-06-04 DIAGNOSIS — K224 Dyskinesia of esophagus: Secondary | ICD-10-CM | POA: Diagnosis not present

## 2018-06-04 DIAGNOSIS — R110 Nausea: Secondary | ICD-10-CM | POA: Diagnosis not present

## 2018-06-04 DIAGNOSIS — K449 Diaphragmatic hernia without obstruction or gangrene: Secondary | ICD-10-CM | POA: Diagnosis not present

## 2018-06-04 DIAGNOSIS — K769 Liver disease, unspecified: Secondary | ICD-10-CM | POA: Diagnosis not present

## 2018-06-04 DIAGNOSIS — R1011 Right upper quadrant pain: Secondary | ICD-10-CM | POA: Diagnosis not present

## 2018-06-04 DIAGNOSIS — Z9884 Bariatric surgery status: Secondary | ICD-10-CM | POA: Diagnosis not present

## 2018-06-05 DIAGNOSIS — R110 Nausea: Secondary | ICD-10-CM | POA: Diagnosis not present

## 2018-06-05 DIAGNOSIS — Z9884 Bariatric surgery status: Secondary | ICD-10-CM | POA: Diagnosis not present

## 2018-06-05 DIAGNOSIS — R109 Unspecified abdominal pain: Secondary | ICD-10-CM | POA: Diagnosis not present

## 2018-06-20 DIAGNOSIS — Z8616 Personal history of COVID-19: Secondary | ICD-10-CM

## 2018-06-20 HISTORY — DX: Personal history of COVID-19: Z86.16

## 2018-06-25 DIAGNOSIS — Z0000 Encounter for general adult medical examination without abnormal findings: Secondary | ICD-10-CM | POA: Diagnosis not present

## 2018-06-25 DIAGNOSIS — K769 Liver disease, unspecified: Secondary | ICD-10-CM | POA: Diagnosis not present

## 2018-06-25 DIAGNOSIS — K5790 Diverticulosis of intestine, part unspecified, without perforation or abscess without bleeding: Secondary | ICD-10-CM | POA: Diagnosis not present

## 2018-06-25 DIAGNOSIS — R748 Abnormal levels of other serum enzymes: Secondary | ICD-10-CM | POA: Diagnosis not present

## 2018-06-28 DIAGNOSIS — E782 Mixed hyperlipidemia: Secondary | ICD-10-CM | POA: Diagnosis not present

## 2018-06-28 DIAGNOSIS — R7301 Impaired fasting glucose: Secondary | ICD-10-CM | POA: Diagnosis not present

## 2018-06-28 DIAGNOSIS — I498 Other specified cardiac arrhythmias: Secondary | ICD-10-CM | POA: Diagnosis not present

## 2018-06-28 DIAGNOSIS — F5101 Primary insomnia: Secondary | ICD-10-CM | POA: Diagnosis not present

## 2018-06-28 DIAGNOSIS — E038 Other specified hypothyroidism: Secondary | ICD-10-CM | POA: Diagnosis not present

## 2018-06-28 DIAGNOSIS — Z6828 Body mass index (BMI) 28.0-28.9, adult: Secondary | ICD-10-CM | POA: Diagnosis not present

## 2018-06-28 DIAGNOSIS — R740 Nonspecific elevation of levels of transaminase and lactic acid dehydrogenase [LDH]: Secondary | ICD-10-CM | POA: Diagnosis not present

## 2018-07-05 DIAGNOSIS — M5137 Other intervertebral disc degeneration, lumbosacral region: Secondary | ICD-10-CM | POA: Diagnosis not present

## 2018-07-05 DIAGNOSIS — M48062 Spinal stenosis, lumbar region with neurogenic claudication: Secondary | ICD-10-CM | POA: Diagnosis not present

## 2018-07-05 DIAGNOSIS — M533 Sacrococcygeal disorders, not elsewhere classified: Secondary | ICD-10-CM | POA: Diagnosis not present

## 2018-07-24 DIAGNOSIS — M533 Sacrococcygeal disorders, not elsewhere classified: Secondary | ICD-10-CM | POA: Diagnosis not present

## 2018-07-30 ENCOUNTER — Ambulatory Visit: Payer: Medicare Other | Admitting: Pulmonary Disease

## 2018-08-23 ENCOUNTER — Other Ambulatory Visit: Payer: Self-pay | Admitting: Neurosurgery

## 2018-08-24 DIAGNOSIS — Z0000 Encounter for general adult medical examination without abnormal findings: Secondary | ICD-10-CM | POA: Diagnosis not present

## 2018-08-24 NOTE — Pre-Procedure Instructions (Signed)
Courtney [name]  08/24/2018      [facility] - [place], [place] - [address] [place] [address] Phone: [phone] Fax: [phone]    Your procedure is scheduled on March 16th.  Report to [facility] Entrance "A" Admitting at 10:00 A.M.  Call this number if you have problems the morning of surgery:  [phone]   Remember:  Do not eat or drink after midnight.    Take these medicines the morning of surgery with A SIP OF WATER   Albuterol Inhaler - if needed   Flonase - if needed  Breo Ellipta Inhaler  Hydrocodone - if needed  Levothyroxine (Synthroid) - if needed  Metoprolol  Nitroglycerin - if needed   Follow your surgeon's instructions on when to stop Asprin.  If no instructions were given by your surgeon then you will need to call the office to get those instructions.    7 days prior to surgery STOP taking any Aleve, Naproxen, Ibuprofen, Motrin, Advil, Goody's, BC's, all herbal medications, fish oil, and all vitamins.     Do not wear jewelry, make-up or nail polish.  Do not wear lotions, powders, or perfumes, or deodorant.  Do not shave 48 hours prior to surgery.   Do not bring valuables to the hospital.  [facility] is not responsible for any belongings or valuables.   [place] [facility]- Preparing For Surgery  Before surgery, you can play an important role. Because skin is not sterile, your skin needs to be as free of germs as possible. You can reduce the number of germs on your skin by washing with CHG (chlorahexidine gluconate) Soap before surgery.  CHG is an antiseptic cleaner which kills germs and bonds with the skin to continue killing germs even after washing.    Oral Hygiene is also important to reduce your risk of infection.  Remember - BRUSH YOUR TEETH THE MORNING OF SURGERY WITH YOUR REGULAR TOOTHPASTE  Please do not use if you have an allergy to CHG or antibacterial soaps. If your skin becomes reddened/irritated stop using the  CHG.  Do not shave (including legs and underarms) for at least 48 hours prior to first CHG shower. It is OK to shave your face.  Please follow these instructions carefully.   1. Shower the NIGHT BEFORE SURGERY and the MORNING OF SURGERY with CHG.   2. If you chose to wash your hair, wash your hair first as usual with your normal shampoo.  3. After you shampoo, rinse your hair and body thoroughly to remove the shampoo.  4. Use CHG as you would any other liquid soap. You can apply CHG directly to the skin and wash gently with a scrungie or a clean washcloth.   5. Apply the CHG Soap to your body ONLY FROM THE NECK DOWN.  Do not use on open wounds or open sores. Avoid contact with your eyes, ears, mouth and genitals (private parts). Wash Face and genitals (private parts)  with your normal soap.  6. Wash thoroughly, paying special attention to the area where your surgery will be performed.  7. Thoroughly rinse your body with warm water from the neck down.  8. DO NOT shower/wash with your normal soap after using and rinsing off the CHG Soap.  9. Pat yourself dry with a CLEAN TOWEL.  10. Wear CLEAN PAJAMAS to bed the night before surgery, wear comfortable clothes the morning of surgery  11. Place CLEAN SHEETS  on your bed the night of your first shower and DO NOT SLEEP WITH PETS.   Day of Surgery:  Do not apply any deodorants/lotions.  Please wear clean clothes to the hospital/surgery center.   Remember to brush your teeth WITH YOUR REGULAR TOOTHPASTE.   Contacts, dentures or bridgework may not be worn into surgery.  Leave your suitcase in the car.  After surgery it may be brought to your room.  For patients admitted to the hospital, discharge time will be determined by your treatment team.  Patients discharged the day of surgery will not be allowed to drive home.   Please read over the following fact sheets that you were given. Coughing and Deep Breathing

## 2018-08-24 NOTE — Progress Notes (Signed)
No orders as of 08-24-18 @ 0330.  Message sent to Dr. Arnoldo Morale requesting orders.

## 2018-08-27 ENCOUNTER — Encounter (HOSPITAL_COMMUNITY): Payer: Self-pay

## 2018-08-27 ENCOUNTER — Encounter (HOSPITAL_COMMUNITY)
Admission: RE | Admit: 2018-08-27 | Discharge: 2018-08-27 | Disposition: A | Discharge status: Home / Self Care | Payer: Medicare Other | Source: Ambulatory Visit | Attending: Neurosurgery | Admitting: Neurosurgery

## 2018-08-27 ENCOUNTER — Other Ambulatory Visit: Payer: Self-pay

## 2018-08-27 DIAGNOSIS — Z01812 Encounter for preprocedural laboratory examination: Secondary | ICD-10-CM | POA: Diagnosis not present

## 2018-08-27 HISTORY — DX: Headache, unspecified: R51.9

## 2018-08-27 HISTORY — DX: Headache: R51

## 2018-08-27 LAB — TYPE AND SCREEN
ABO/RH(D): O POS
Antibody Screen: NEGATIVE

## 2018-08-27 LAB — BASIC METABOLIC PANEL
Anion gap: 8 (ref 5–15)
BUN: 15 mg/dL (ref 8–23)
CO2: 28 mmol/L (ref 22–32)
Calcium: 9.8 mg/dL (ref 8.9–10.3)
Chloride: 102 mmol/L (ref 98–111)
Creatinine, Ser: 0.87 mg/dL (ref 0.44–1.00)
GFR calc Af Amer: >60 mL/min (ref >60)
GFR calc non Af Amer: >60 mL/min (ref >60)
Glucose, Bld: 102 mg/dL — ABNORMAL HIGH (ref 70–99)
POTASSIUM: 4.3 mmol/L (ref 3.5–5.1)
Sodium: 138 mmol/L (ref 135–145)

## 2018-08-27 LAB — CBC
HCT: 41.8 % (ref 36.0–46.0)
Hemoglobin: 13.1 g/dL (ref 12.0–15.0)
MCH: 30 pg (ref 26.0–34.0)
MCHC: 31.3 g/dL (ref 30.0–36.0)
MCV: 95.7 fL (ref 80.0–100.0)
Platelets: 244 10*3/uL (ref 150–400)
RBC: 4.37 MIL/uL (ref 3.87–5.11)
RDW: 13.7 % (ref 11.5–15.5)
WBC: 8.3 10*3/uL (ref 4.0–10.5)
nRBC: 0 % (ref 0.0–0.2)

## 2018-08-27 LAB — SURGICAL PCR SCREEN
MRSA, PCR: NEGATIVE
STAPHYLOCOCCUS AUREUS: NEGATIVE

## 2018-08-27 NOTE — Progress Notes (Signed)
Spoke with Lorriane Shire at Dr. Arnoldo Morale office regarding preop orders.  She states he'll be back in the office tomorrow & it will be done then.

## 2018-08-27 NOTE — Progress Notes (Signed)
PCP - [name] office  Cardiologist - [facility]   Chest x-ray -  EKG - 03/2018 Stress Test -  ECHO -  Cardiac Cath - 2010- or earlier - [facility]  Sleep Study - yes- results showed no apnea concerns  CPAP -   Fasting Blood Sugar - n/a Checks Blood Sugar _0____ times a day  Blood Thinner Instructions:n/a Aspirin Instructions:pt. Has stopped for surgery  Anesthesia review: sent fax to [facility]- Dr. Kennith Center for records. Pt. Also sees Dr. Vaughan Browner with [facility] for Bronchietasis- uses a percusion vest a couple times per day.    Patient denies shortness of breath, fever, cough and chest pain at PAT appointment   Patient verbalized understanding of instructions that were given to them at the PAT appointment. Patient was also instructed that they will need to review over the PAT instructions again at home before surgery.

## 2018-08-28 NOTE — Anesthesia Preprocedure Evaluation (Addendum)
Anesthesia Evaluation  Patient identified by MRN, date of birth, ID band Patient awake    Reviewed: Allergy & Precautions, NPO status , Patient's Chart, lab work & pertinent test results  Airway Mallampati: II  TM Distance: >3 FB Neck ROM: Full    Dental  (+) Teeth Intact, Dental Advisory Given   Pulmonary    breath sounds clear to auscultation       Cardiovascular hypertension, Pt. on home beta blockers  Rhythm:Regular Rate:Normal     Neuro/Psych  Headaches, Depression    GI/Hepatic negative GI ROS, Neg liver ROS,   Endo/Other  Hypothyroidism   Renal/GU negative Renal ROS     Musculoskeletal  (+) Arthritis ,   Abdominal Normal abdominal exam  (+)   Peds  Hematology   Anesthesia Other Findings   Reproductive/Obstetrics                           Anesthesia Physical Anesthesia Plan  ASA: III  Anesthesia Plan: General   Post-op Pain Management:    Induction: Intravenous  PONV Risk Score and Plan: 4 or greater and Ondansetron, Dexamethasone and Treatment may vary due to age or medical condition  Airway Management Planned: Oral ETT  Additional Equipment: None  Intra-op Plan:   Post-operative Plan: Extubation in OR  Informed Consent: I have reviewed the patients History and Physical, chart, labs and discussed the procedure including the risks, benefits and alternatives for the proposed anesthesia with the patient or authorized representative who has indicated his/her understanding and acceptance.     Dental advisory given  Plan Discussed with: CRNA  Anesthesia Plan Comments: (Follows with cardiology at [facility] for palpitations and severe statin inolerant HLD, on PCSK9i. Cardiac clearance on pt chart states she is low risk. EKG on pt chart 04/12/18 shows Sinus rhythm with PACs, Rate 81. Stress echo 04/16/2013 in care everywhere showed EF 50-55%, no ischemia, good exercise capacity.    Follows with pulmonology for recurrent bronchitis and bronchiectasis, uses percussion vest for mucous clearance. Discussed with Dr. Deatra Canter. As long as pt at baseline, no exacerbation, can proceed as planned. 03/21/2018 PFTs: FVC 2.53 (90%), FEV1 2.29 (99%), F/F 90, TLC 85%, DLCO 87%  Ketamine 0.25mg /kg IV intraop)     Anesthesia Quick Evaluation

## 2018-08-30 DIAGNOSIS — N3000 Acute cystitis without hematuria: Secondary | ICD-10-CM | POA: Diagnosis not present

## 2018-08-30 DIAGNOSIS — R51 Headache: Secondary | ICD-10-CM | POA: Diagnosis not present

## 2018-08-30 DIAGNOSIS — M5431 Sciatica, right side: Secondary | ICD-10-CM | POA: Diagnosis not present

## 2018-08-30 DIAGNOSIS — J018 Other acute sinusitis: Secondary | ICD-10-CM | POA: Diagnosis not present

## 2018-09-03 ENCOUNTER — Observation Stay (HOSPITAL_COMMUNITY)
Admission: RE | Admit: 2018-09-03 | Discharge: 2018-09-04 | Disposition: A | Discharge status: Home / Self Care | Payer: Medicare Other | Source: Ambulatory Visit | Attending: Neurosurgery | Admitting: Neurosurgery

## 2018-09-03 ENCOUNTER — Encounter (HOSPITAL_COMMUNITY): Payer: Self-pay | Admitting: Certified Registered Nurse Anesthetist

## 2018-09-03 ENCOUNTER — Other Ambulatory Visit: Payer: Self-pay

## 2018-09-03 ENCOUNTER — Inpatient Hospital Stay (HOSPITAL_COMMUNITY): Payer: Medicare Other

## 2018-09-03 ENCOUNTER — Encounter (HOSPITAL_COMMUNITY)
Admission: RE | Disposition: A | Discharge status: Home / Self Care | Payer: Self-pay | Source: Ambulatory Visit | Attending: Neurosurgery

## 2018-09-03 ENCOUNTER — Inpatient Hospital Stay (HOSPITAL_COMMUNITY): Payer: Medicare Other | Admitting: Certified Registered Nurse Anesthetist

## 2018-09-03 ENCOUNTER — Inpatient Hospital Stay (HOSPITAL_COMMUNITY): Payer: Medicare Other | Admitting: Physician Assistant

## 2018-09-03 DIAGNOSIS — M1990 Unspecified osteoarthritis, unspecified site: Secondary | ICD-10-CM | POA: Insufficient documentation

## 2018-09-03 DIAGNOSIS — I10 Essential (primary) hypertension: Secondary | ICD-10-CM | POA: Insufficient documentation

## 2018-09-03 DIAGNOSIS — G9589 Other specified diseases of spinal cord: Secondary | ICD-10-CM | POA: Diagnosis not present

## 2018-09-03 DIAGNOSIS — Z79899 Other long term (current) drug therapy: Secondary | ICD-10-CM | POA: Insufficient documentation

## 2018-09-03 DIAGNOSIS — M5116 Intervertebral disc disorders with radiculopathy, lumbar region: Secondary | ICD-10-CM | POA: Insufficient documentation

## 2018-09-03 DIAGNOSIS — Z79890 Hormone replacement therapy (postmenopausal): Secondary | ICD-10-CM | POA: Insufficient documentation

## 2018-09-03 DIAGNOSIS — M4726 Other spondylosis with radiculopathy, lumbar region: Secondary | ICD-10-CM | POA: Insufficient documentation

## 2018-09-03 DIAGNOSIS — Z7982 Long term (current) use of aspirin: Secondary | ICD-10-CM | POA: Insufficient documentation

## 2018-09-03 DIAGNOSIS — Y832 Surgical operation with anastomosis, bypass or graft as the cause of abnormal reaction of the patient, or of later complication, without mention of misadventure at the time of the procedure: Secondary | ICD-10-CM | POA: Insufficient documentation

## 2018-09-03 DIAGNOSIS — Z419 Encounter for procedure for purposes other than remedying health state, unspecified: Secondary | ICD-10-CM

## 2018-09-03 DIAGNOSIS — Z981 Arthrodesis status: Secondary | ICD-10-CM | POA: Insufficient documentation

## 2018-09-03 DIAGNOSIS — Z7951 Long term (current) use of inhaled steroids: Secondary | ICD-10-CM | POA: Insufficient documentation

## 2018-09-03 DIAGNOSIS — E039 Hypothyroidism, unspecified: Secondary | ICD-10-CM | POA: Diagnosis not present

## 2018-09-03 DIAGNOSIS — M960 Pseudarthrosis after fusion or arthrodesis: Secondary | ICD-10-CM | POA: Diagnosis not present

## 2018-09-03 DIAGNOSIS — M48062 Spinal stenosis, lumbar region with neurogenic claudication: Principal | ICD-10-CM | POA: Diagnosis present

## 2018-09-03 DIAGNOSIS — Z9884 Bariatric surgery status: Secondary | ICD-10-CM | POA: Insufficient documentation

## 2018-09-03 DIAGNOSIS — M5136 Other intervertebral disc degeneration, lumbar region: Secondary | ICD-10-CM | POA: Diagnosis not present

## 2018-09-03 DIAGNOSIS — M4326 Fusion of spine, lumbar region: Secondary | ICD-10-CM | POA: Diagnosis not present

## 2018-09-03 SURGERY — POSTERIOR LUMBAR FUSION 1 LEVEL
Anesthesia: General | Site: Spine Lumbar

## 2018-09-03 MED ORDER — PHENOL 1.4 % MT LIQD: 1.0000 | OROMUCOSAL | Status: DC | PRN

## 2018-09-03 MED ORDER — BISACODYL 10 MG RE SUPP: 10.0000 mg | Freq: Every day | RECTAL | Status: DC | PRN

## 2018-09-03 MED ORDER — ACETAMINOPHEN 160 MG/5ML PO SOLN: 325.0000 mg | Freq: Once | ORAL | Status: DC

## 2018-09-03 MED ORDER — MORPHINE SULFATE (PF) 4 MG/ML IV SOLN
4.0000 mg | INTRAVENOUS | Status: DC | PRN
  Administered 2018-09-03: 4 mg via INTRAVENOUS
  Filled 2018-09-03: qty 1

## 2018-09-03 MED ORDER — OXYCODONE HCL 5 MG PO TABS
10.0000 mg | ORAL_TABLET | ORAL | Status: DC | PRN
  Administered 2018-09-03 – 2018-09-04 (×5): 10 mg via ORAL
  Filled 2018-09-03 (×4): qty 2

## 2018-09-03 MED ORDER — DEXAMETHASONE SODIUM PHOSPHATE 10 MG/ML IJ SOLN
INTRAMUSCULAR | Status: DC | PRN
  Administered 2018-09-03: 10 mg via INTRAVENOUS

## 2018-09-03 MED ORDER — PROMETHAZINE HCL 25 MG/ML IJ SOLN: 6.2500 mg | INTRAMUSCULAR | Status: DC | PRN

## 2018-09-03 MED ORDER — CYCLOBENZAPRINE HCL 10 MG PO TABS
ORAL_TABLET | ORAL | Status: AC
  Filled 2018-09-03: qty 1

## 2018-09-03 MED ORDER — FENTANYL CITRATE (PF) 250 MCG/5ML IJ SOLN
INTRAMUSCULAR | Status: AC
  Filled 2018-09-03: qty 5

## 2018-09-03 MED ORDER — LEVOTHYROXINE SODIUM 25 MCG PO TABS
50.0000 ug | ORAL_TABLET | Freq: Every day | ORAL | Status: DC
  Administered 2018-09-04: 50 ug via ORAL
  Filled 2018-09-03: qty 2

## 2018-09-03 MED ORDER — OXYCODONE HCL 5 MG PO TABS: 5.0000 mg | ORAL_TABLET | ORAL | Status: DC | PRN

## 2018-09-03 MED ORDER — CEFAZOLIN SODIUM-DEXTROSE 2-4 GM/100ML-% IV SOLN
2.0000 g | Freq: Three times a day (TID) | INTRAVENOUS | Status: AC
  Administered 2018-09-03 – 2018-09-04 (×2): 2 g via INTRAVENOUS
  Filled 2018-09-03 (×2): qty 100

## 2018-09-03 MED ORDER — THROMBIN 20000 UNITS EX SOLR
CUTANEOUS | Status: AC
  Filled 2018-09-03: qty 20000

## 2018-09-03 MED ORDER — BACITRACIN ZINC 500 UNIT/GM EX OINT
TOPICAL_OINTMENT | CUTANEOUS | Status: AC
  Filled 2018-09-03: qty 28.35

## 2018-09-03 MED ORDER — ACETAMINOPHEN 325 MG PO TABS: 325.0000 mg | ORAL_TABLET | Freq: Once | ORAL | Status: DC

## 2018-09-03 MED ORDER — CEFAZOLIN SODIUM-DEXTROSE 2-4 GM/100ML-% IV SOLN
2.0000 g | INTRAVENOUS | Status: AC
  Administered 2018-09-03: 2 g via INTRAVENOUS
  Filled 2018-09-03: qty 100

## 2018-09-03 MED ORDER — ROCURONIUM BROMIDE 50 MG/5ML IV SOSY
PREFILLED_SYRINGE | INTRAVENOUS | Status: AC
  Filled 2018-09-03: qty 10

## 2018-09-03 MED ORDER — LIDOCAINE 2% (20 MG/ML) 5 ML SYRINGE
INTRAMUSCULAR | Status: AC
  Filled 2018-09-03: qty 5

## 2018-09-03 MED ORDER — POTASSIUM CHLORIDE CRYS ER 20 MEQ PO TBCR
20.0000 meq | EXTENDED_RELEASE_TABLET | Freq: Every day | ORAL | Status: DC
  Administered 2018-09-03: 20 meq via ORAL
  Filled 2018-09-03: qty 1

## 2018-09-03 MED ORDER — PROPOFOL 10 MG/ML IV BOLUS
INTRAVENOUS | Status: DC | PRN
  Administered 2018-09-03: 50 mg via INTRAVENOUS
  Administered 2018-09-03: 150 mg via INTRAVENOUS

## 2018-09-03 MED ORDER — HYDROMORPHONE HCL 1 MG/ML IJ SOLN
0.2500 mg | INTRAMUSCULAR | Status: DC | PRN
  Administered 2018-09-03 (×4): 0.5 mg via INTRAVENOUS

## 2018-09-03 MED ORDER — ROCURONIUM BROMIDE 100 MG/10ML IV SOLN
INTRAVENOUS | Status: DC | PRN
  Administered 2018-09-03: 50 mg via INTRAVENOUS
  Administered 2018-09-03: 10 mg via INTRAVENOUS
  Administered 2018-09-03: 20 mg via INTRAVENOUS

## 2018-09-03 MED ORDER — METOPROLOL TARTRATE 25 MG PO TABS
50.0000 mg | ORAL_TABLET | Freq: Two times a day (BID) | ORAL | Status: DC
  Administered 2018-09-03: 50 mg via ORAL
  Filled 2018-09-03: qty 2

## 2018-09-03 MED ORDER — CHLORHEXIDINE GLUCONATE CLOTH 2 % EX PADS: 6.0000 | MEDICATED_PAD | Freq: Once | CUTANEOUS | Status: DC

## 2018-09-03 MED ORDER — ACETAMINOPHEN 10 MG/ML IV SOLN
1000.0000 mg | Freq: Once | INTRAVENOUS | Status: DC | PRN
  Administered 2018-09-03: 1000 mg via INTRAVENOUS

## 2018-09-03 MED ORDER — CYCLOBENZAPRINE HCL 10 MG PO TABS
10.0000 mg | ORAL_TABLET | Freq: Three times a day (TID) | ORAL | Status: DC | PRN
  Administered 2018-09-03 (×2): 10 mg via ORAL
  Filled 2018-09-03: qty 1

## 2018-09-03 MED ORDER — HYDROMORPHONE HCL 1 MG/ML IJ SOLN
INTRAMUSCULAR | Status: AC
  Administered 2018-09-03: 0.5 mg via INTRAVENOUS
  Filled 2018-09-03: qty 1

## 2018-09-03 MED ORDER — SODIUM CHLORIDE 0.9% FLUSH: 3.0000 mL | INTRAVENOUS | Status: DC | PRN

## 2018-09-03 MED ORDER — ZOLPIDEM TARTRATE 5 MG PO TABS: 5.0000 mg | ORAL_TABLET | Freq: Every evening | ORAL | Status: DC | PRN

## 2018-09-03 MED ORDER — ONDANSETRON HCL 4 MG/2ML IJ SOLN
INTRAMUSCULAR | Status: AC
  Filled 2018-09-03: qty 2

## 2018-09-03 MED ORDER — FUROSEMIDE 20 MG PO TABS
20.0000 mg | ORAL_TABLET | Freq: Every day | ORAL | Status: DC
  Filled 2018-09-03: qty 1

## 2018-09-03 MED ORDER — SODIUM CHLORIDE 0.9 % IV SOLN
INTRAVENOUS | Status: DC | PRN
  Administered 2018-09-03: 50 ug/min via INTRAVENOUS

## 2018-09-03 MED ORDER — LIDOCAINE HCL (CARDIAC) PF 100 MG/5ML IV SOSY
PREFILLED_SYRINGE | INTRAVENOUS | Status: DC | PRN
  Administered 2018-09-03: 40 mg via INTRAVENOUS

## 2018-09-03 MED ORDER — 0.9 % SODIUM CHLORIDE (POUR BTL) OPTIME
TOPICAL | Status: DC | PRN
  Administered 2018-09-03: 1000 mL

## 2018-09-03 MED ORDER — THROMBIN 5000 UNITS EX SOLR
CUTANEOUS | Status: AC
  Filled 2018-09-03: qty 5000

## 2018-09-03 MED ORDER — DOCUSATE SODIUM 100 MG PO CAPS
100.0000 mg | ORAL_CAPSULE | Freq: Two times a day (BID) | ORAL | Status: DC
  Administered 2018-09-03: 100 mg via ORAL
  Filled 2018-09-03: qty 1

## 2018-09-03 MED ORDER — PROPOFOL 10 MG/ML IV BOLUS
INTRAVENOUS | Status: AC
  Filled 2018-09-03: qty 20

## 2018-09-03 MED ORDER — PHENYLEPHRINE 40 MCG/ML (10ML) SYRINGE FOR IV PUSH (FOR BLOOD PRESSURE SUPPORT)
PREFILLED_SYRINGE | INTRAVENOUS | Status: DC | PRN
  Administered 2018-09-03: 40 ug via INTRAVENOUS
  Administered 2018-09-03 (×2): 80 ug via INTRAVENOUS
  Administered 2018-09-03: 120 ug via INTRAVENOUS

## 2018-09-03 MED ORDER — VANCOMYCIN HCL 1000 MG IV SOLR
INTRAVENOUS | Status: AC
  Filled 2018-09-03: qty 1000

## 2018-09-03 MED ORDER — SUGAMMADEX SODIUM 200 MG/2ML IV SOLN
INTRAVENOUS | Status: DC | PRN
  Administered 2018-09-03: 160 mg via INTRAVENOUS

## 2018-09-03 MED ORDER — LIDOCAINE-EPINEPHRINE 1 %-1:100000 IJ SOLN
INTRAMUSCULAR | Status: AC
  Filled 2018-09-03: qty 1

## 2018-09-03 MED ORDER — FENTANYL CITRATE (PF) 250 MCG/5ML IJ SOLN
INTRAMUSCULAR | Status: DC | PRN
  Administered 2018-09-03 (×2): 50 ug via INTRAVENOUS
  Administered 2018-09-03: 100 ug via INTRAVENOUS
  Administered 2018-09-03: 50 ug via INTRAVENOUS

## 2018-09-03 MED ORDER — BUPIVACAINE LIPOSOME 1.3 % IJ SUSP
20.0000 mL | INTRAMUSCULAR | Status: DC
  Filled 2018-09-03: qty 20

## 2018-09-03 MED ORDER — ALBUTEROL SULFATE (2.5 MG/3ML) 0.083% IN NEBU: 3.0000 mL | INHALATION_SOLUTION | Freq: Four times a day (QID) | RESPIRATORY_TRACT | Status: DC | PRN

## 2018-09-03 MED ORDER — SODIUM CHLORIDE 0.9 % IV SOLN: 250.0000 mL | INTRAVENOUS | Status: DC

## 2018-09-03 MED ORDER — MEPERIDINE HCL 50 MG/ML IJ SOLN: 6.2500 mg | INTRAMUSCULAR | Status: DC | PRN

## 2018-09-03 MED ORDER — MENTHOL 3 MG MT LOZG: 1.0000 | LOZENGE | OROMUCOSAL | Status: DC | PRN

## 2018-09-03 MED ORDER — ONDANSETRON HCL 4 MG/2ML IJ SOLN: 4.0000 mg | Freq: Four times a day (QID) | INTRAMUSCULAR | Status: DC | PRN

## 2018-09-03 MED ORDER — THROMBIN 5000 UNITS EX SOLR
OROMUCOSAL | Status: DC | PRN
  Administered 2018-09-03: 13:00:00 via TOPICAL

## 2018-09-03 MED ORDER — ACETAMINOPHEN 325 MG PO TABS: 650.0000 mg | ORAL_TABLET | ORAL | Status: DC | PRN

## 2018-09-03 MED ORDER — ONDANSETRON HCL 4 MG PO TABS: 4.0000 mg | ORAL_TABLET | Freq: Four times a day (QID) | ORAL | Status: DC | PRN

## 2018-09-03 MED ORDER — SODIUM CHLORIDE 0.9 % IV SOLN
INTRAVENOUS | Status: DC | PRN
  Administered 2018-09-03: 13:00:00

## 2018-09-03 MED ORDER — SODIUM CHLORIDE 0.9% FLUSH
3.0000 mL | Freq: Two times a day (BID) | INTRAVENOUS | Status: DC
  Administered 2018-09-03: 3 mL via INTRAVENOUS

## 2018-09-03 MED ORDER — FLUTICASONE FUROATE-VILANTEROL 200-25 MCG/INH IN AEPB
1.0000 | INHALATION_SPRAY | Freq: Every day | RESPIRATORY_TRACT | Status: DC
  Administered 2018-09-04: 1 via RESPIRATORY_TRACT
  Filled 2018-09-03: qty 28

## 2018-09-03 MED ORDER — LACTATED RINGERS IV SOLN: INTRAVENOUS | Status: DC

## 2018-09-03 MED ORDER — ONDANSETRON HCL 4 MG/2ML IJ SOLN
INTRAMUSCULAR | Status: DC | PRN
  Administered 2018-09-03: 4 mg via INTRAVENOUS

## 2018-09-03 MED ORDER — ACETAMINOPHEN 10 MG/ML IV SOLN
INTRAVENOUS | Status: AC
  Administered 2018-09-03: 1000 mg via INTRAVENOUS
  Filled 2018-09-03: qty 100

## 2018-09-03 MED ORDER — HYDROCODONE-ACETAMINOPHEN 10-325 MG PO TABS
1.0000 | ORAL_TABLET | ORAL | Status: DC | PRN
  Administered 2018-09-04: 2 via ORAL
  Filled 2018-09-03: qty 2

## 2018-09-03 MED ORDER — THROMBIN 20000 UNITS EX SOLR
CUTANEOUS | Status: DC | PRN
  Administered 2018-09-03: 15:00:00 via TOPICAL

## 2018-09-03 MED ORDER — KETAMINE HCL 50 MG/5ML IJ SOSY
PREFILLED_SYRINGE | INTRAMUSCULAR | Status: AC
  Filled 2018-09-03: qty 5

## 2018-09-03 MED ORDER — KETAMINE HCL 10 MG/ML IJ SOLN
INTRAMUSCULAR | Status: DC | PRN
  Administered 2018-09-03: 20 mg via INTRAVENOUS

## 2018-09-03 MED ORDER — NITROGLYCERIN 0.4 MG SL SUBL: 0.4000 mg | SUBLINGUAL_TABLET | SUBLINGUAL | Status: DC | PRN

## 2018-09-03 MED ORDER — ACETAMINOPHEN 650 MG RE SUPP: 650.0000 mg | RECTAL | Status: DC | PRN

## 2018-09-03 MED ORDER — ROCURONIUM BROMIDE 50 MG/5ML IV SOSY
PREFILLED_SYRINGE | INTRAVENOUS | Status: AC
  Filled 2018-09-03: qty 5

## 2018-09-03 MED ORDER — FLUTICASONE PROPIONATE 50 MCG/ACT NA SUSP
2.0000 | Freq: Every day | NASAL | Status: DC | PRN
  Filled 2018-09-03: qty 16

## 2018-09-03 MED ORDER — LIDOCAINE-EPINEPHRINE 1 %-1:100000 IJ SOLN
INTRAMUSCULAR | Status: DC | PRN
  Administered 2018-09-03: 10 mL

## 2018-09-03 MED ORDER — DEXAMETHASONE SODIUM PHOSPHATE 10 MG/ML IJ SOLN
INTRAMUSCULAR | Status: AC
  Filled 2018-09-03: qty 1

## 2018-09-03 MED ORDER — OXYCODONE HCL 5 MG PO TABS
ORAL_TABLET | ORAL | Status: AC
  Administered 2018-09-03: 10 mg via ORAL
  Filled 2018-09-03: qty 2

## 2018-09-03 MED ORDER — LACTATED RINGERS IV SOLN
INTRAVENOUS | Status: DC
  Administered 2018-09-03 (×2): via INTRAVENOUS

## 2018-09-03 MED ORDER — ACETAMINOPHEN 500 MG PO TABS
1000.0000 mg | ORAL_TABLET | Freq: Four times a day (QID) | ORAL | Status: DC
  Administered 2018-09-03 (×2): 1000 mg via ORAL
  Filled 2018-09-03 (×4): qty 2

## 2018-09-03 SURGICAL SUPPLY — 76 items
ADH SKN CLS APL DERMABOND .7 (GAUZE/BANDAGES/DRESSINGS) ×1
APL SKNCLS STERI-STRIP NONHPOA (GAUZE/BANDAGES/DRESSINGS) ×1
BAG DECANTER FOR FLEXI CONT (MISCELLANEOUS) ×3 IMPLANT
BENZOIN TINCTURE PRP APPL 2/3 (GAUZE/BANDAGES/DRESSINGS) ×3 IMPLANT
BLADE CLIPPER SURG (BLADE) IMPLANT
BUR MATCHSTICK NEURO 3.0 LAGG (BURR) ×5 IMPLANT
BUR PRECISION FLUTE 6.0 (BURR) ×3 IMPLANT
CAGE ALTERA 10X31X9-13 15D (Cage) ×1 IMPLANT
CAGE ALTERA 9-13-15-31MM (Cage) ×1 IMPLANT
CANISTER SUCT 3000ML PPV (MISCELLANEOUS) ×3 IMPLANT
CAP REVERE LOCKING (Cap) ×12 IMPLANT
CARTRIDGE OIL MAESTRO DRILL (MISCELLANEOUS) ×1 IMPLANT
CLOSURE WOUND 1/2 X4 (GAUZE/BANDAGES/DRESSINGS) ×1
CONT SPEC 4OZ CLIKSEAL STRL BL (MISCELLANEOUS) ×3 IMPLANT
COVER BACK TABLE 60X90IN (DRAPES) ×3 IMPLANT
COVER WAND RF STERILE (DRAPES) ×3 IMPLANT
DECANTER SPIKE VIAL GLASS SM (MISCELLANEOUS) ×3 IMPLANT
DERMABOND ADVANCED (GAUZE/BANDAGES/DRESSINGS) ×2
DERMABOND ADVANCED .7 DNX12 (GAUZE/BANDAGES/DRESSINGS) IMPLANT
DIFFUSER DRILL AIR PNEUMATIC (MISCELLANEOUS) ×3 IMPLANT
DRAPE C-ARM 42X72 X-RAY (DRAPES) ×6 IMPLANT
DRAPE HALF SHEET 40X57 (DRAPES) ×3 IMPLANT
DRAPE LAPAROTOMY 100X72X124 (DRAPES) ×3 IMPLANT
DRAPE SURG 17X23 STRL (DRAPES) ×12 IMPLANT
DRSG OPSITE POSTOP 4X6 (GAUZE/BANDAGES/DRESSINGS) ×2 IMPLANT
ELECT BLADE 4.0 EZ CLEAN MEGAD (MISCELLANEOUS) ×3
ELECT REM PT RETURN 9FT ADLT (ELECTROSURGICAL) ×3
ELECTRODE BLDE 4.0 EZ CLN MEGD (MISCELLANEOUS) ×1 IMPLANT
ELECTRODE REM PT RTRN 9FT ADLT (ELECTROSURGICAL) ×1 IMPLANT
EVACUATOR 1/8 PVC DRAIN (DRAIN) IMPLANT
GAUZE 4X4 16PLY RFD (DISPOSABLE) ×3 IMPLANT
GAUZE SPONGE 4X4 12PLY STRL (GAUZE/BANDAGES/DRESSINGS) ×3 IMPLANT
GLOVE BIO SURGEON STRL SZ 6.5 (GLOVE) ×1 IMPLANT
GLOVE BIO SURGEON STRL SZ8 (GLOVE) ×6 IMPLANT
GLOVE BIO SURGEON STRL SZ8.5 (GLOVE) ×6 IMPLANT
GLOVE BIO SURGEONS STRL SZ 6.5 (GLOVE) ×1
GLOVE BIOGEL PI IND STRL 6.5 (GLOVE) IMPLANT
GLOVE BIOGEL PI IND STRL 7.0 (GLOVE) IMPLANT
GLOVE BIOGEL PI INDICATOR 6.5 (GLOVE) ×4
GLOVE BIOGEL PI INDICATOR 7.0 (GLOVE) ×8
GLOVE EXAM NITRILE XL STR (GLOVE) IMPLANT
GLOVE SURG SS PI 6.0 STRL IVOR (GLOVE) ×2 IMPLANT
GOWN STRL REUS W/ TWL LRG LVL3 (GOWN DISPOSABLE) IMPLANT
GOWN STRL REUS W/ TWL XL LVL3 (GOWN DISPOSABLE) ×2 IMPLANT
GOWN STRL REUS W/TWL 2XL LVL3 (GOWN DISPOSABLE) IMPLANT
GOWN STRL REUS W/TWL LRG LVL3 (GOWN DISPOSABLE) ×9
GOWN STRL REUS W/TWL XL LVL3 (GOWN DISPOSABLE) ×6
HEMOSTAT POWDER KIT SURGIFOAM (HEMOSTASIS) ×3 IMPLANT
KIT BASIN OR (CUSTOM PROCEDURE TRAY) ×3 IMPLANT
KIT INFUSE XX SMALL 0.7CC (Orthopedic Implant) ×2 IMPLANT
KIT TURNOVER KIT B (KITS) ×3 IMPLANT
MILL MEDIUM DISP (BLADE) ×3 IMPLANT
NDL HYPO 21X1.5 SAFETY (NEEDLE) IMPLANT
NEEDLE HYPO 21X1.5 SAFETY (NEEDLE) ×3 IMPLANT
NEEDLE HYPO 22GX1.5 SAFETY (NEEDLE) ×3 IMPLANT
NS IRRIG 1000ML POUR BTL (IV SOLUTION) ×3 IMPLANT
OIL CARTRIDGE MAESTRO DRILL (MISCELLANEOUS) ×3
PACK LAMINECTOMY NEURO (CUSTOM PROCEDURE TRAY) ×3 IMPLANT
PAD ARMBOARD 7.5X6 YLW CONV (MISCELLANEOUS) ×9 IMPLANT
PATTIES SURGICAL .5 X1 (DISPOSABLE) IMPLANT
PUTTY DBM 10CC CALC GRAN (Putty) ×2 IMPLANT
ROD REVERE 7.5MM (Rod) ×4 IMPLANT
SCREW 7.5X50MM (Screw) ×4 IMPLANT
SPONGE LAP 4X18 RFD (DISPOSABLE) IMPLANT
SPONGE NEURO XRAY DETECT 1X3 (DISPOSABLE) IMPLANT
SPONGE SURGIFOAM ABS GEL 100 (HEMOSTASIS) ×2 IMPLANT
STRIP CLOSURE SKIN 1/2X4 (GAUZE/BANDAGES/DRESSINGS) ×2 IMPLANT
SUT PROLENE 6 0 BV (SUTURE) ×2 IMPLANT
SUT VIC AB 1 CT1 18XBRD ANBCTR (SUTURE) ×2 IMPLANT
SUT VIC AB 1 CT1 8-18 (SUTURE) ×6
SUT VIC AB 2-0 CP2 18 (SUTURE) ×6 IMPLANT
SYR 20CC LL (SYRINGE) ×2 IMPLANT
TOWEL GREEN STERILE (TOWEL DISPOSABLE) ×3 IMPLANT
TOWEL GREEN STERILE FF (TOWEL DISPOSABLE) ×3 IMPLANT
TRAY FOLEY MTR SLVR 16FR STAT (SET/KITS/TRAYS/PACK) ×3 IMPLANT
WATER STERILE IRR 1000ML POUR (IV SOLUTION) ×3 IMPLANT

## 2018-09-03 NOTE — Plan of Care (Signed)
  Problem: Education: Goal: Ability to verbalize activity precautions or restrictions will improve Outcome: Progressing Goal: Knowledge of the prescribed therapeutic regimen will improve Outcome: Progressing Goal: Understanding of discharge needs will improve Outcome: Progressing   Problem: Activity: Goal: Ability to avoid complications of mobility impairment will improve Outcome: Progressing Goal: Ability to tolerate increased activity will improve Outcome: Progressing Goal: Will remain free from falls Outcome: Progressing   

## 2018-09-03 NOTE — H&P (Signed)
Subjective: The patient is a 73 year old white female on whom I previously performed an L4-5 decompression and fusion.  She did well but has developed recurrent back and leg pain consistent with neurogenic claudication.  She failed medical management and was worked up with a lumbar MRI and lumbar x-rays.  This demonstrated adjacent segment stenosis at L3-4.  I discussed the various treatment options with the patient.  She has decided to proceed with surgery.  Past Medical History:  Diagnosis Date  . Anemia   . Arthritis   . Depression, major, recurrent, moderate ([place])   . Headache   . Heart murmur   . History of bladder infections   . Hyperlipidemia   . Hypertension   . Hypothyroidism   . Knee pain, bilateral   . Osteoarthritis   . Pneumonia   . Recovering alcoholic ([place])   . SVT (supraventricular tachycardia) ([place])   . UTI (urinary tract infection)   . Varicose veins   . Vitamin B12 deficiency     Past Surgical History:  Procedure Laterality Date  . ABDOMINAL HYSTERECTOMY  1991  . ANGIOPLASTY     at least 15 years ago, pt. denies   . APPENDECTOMY    . AUGMENTATION MAMMAPLASTY Bilateral   . BACK SURGERY     had 2 surgeries. Have rods placed in back   . BARIATRIC SURGERY     lap band-at least 14 years ago  . BREAST BIOPSY Left    x2  . BREAST ENHANCEMENT SURGERY  2006  . COLONOSCOPY  12/01/2016   Moderate predominantly sigmoid diverticulosis.   [name] EYE SURGERY     IOL- bilateral - [place]  . KNEE ARTHROSCOPY Left   . lap band surgery  1981  . LUMBAR FUSION  07/29/2015   posterior level one  . removal of cervical disc fragments  10/2007   pt. denies     Allergies  Allergen Reactions  . Levofloxacin Hives, Shortness Of Breath and Swelling    RASH IN MOUTH   (can take IV route)  . Prednisone Itching  . Statins Other (See Comments)    Myalgias  . Gabapentin Nausea Only    Social History   Tobacco Use  . Smoking status: Never Smoker  . Smokeless tobacco: Never  Used  Substance Use Topics  . Alcohol use: No    Alcohol/week: 0.0 standard drinks    Comment: recovery x 20 yrs    Family History  Problem Relation Age of Onset  . Heart disease Mother   . Heart disease Father   . Heart disease Brother   . Diabetes Sister   . Heart disease Brother   . Heart disease Brother   . Heart disease Brother   . Heart disease Brother   . Other Sister        BRAIN TUMOR   Prior to Admission medications   Medication Sig Start Date End Date Taking? Authorizing Provider  albuterol (PROAIR HFA) 108 (90 Base) MCG/ACT inhaler Inhale 2 puffs into the lungs every 6 (six) hours as needed for wheezing or shortness of breath. 03/20/18  Yes [name], [name], MD  aspirin EC 81 MG tablet Take 81 mg by mouth daily.   Yes [provider]  fluticasone (FLONASE) 50 MCG/ACT nasal spray Place 2 sprays into both nostrils daily as needed for allergies.  08/07/17  Yes [provider]  fluticasone furoate-vilanterol (BREO ELLIPTA) 200-25 MCG/INH AEPB Inhale 1 puff into the lungs daily. 03/20/18  Yes [name], [name],  MD  furosemide (LASIX) 20 MG tablet Take 20 mg by mouth daily.  01/13/15  Yes [provider]  HYDROcodone-acetaminophen (NORCO) 10-325 MG tablet Take 1 tablet by mouth every 6 (six) hours as needed for pain. 08/04/18  Yes [provider]  ibuprofen (ADVIL,MOTRIN) 200 MG tablet Take 800 mg by mouth every 6 (six) hours as needed.   Yes [provider]  levothyroxine (SYNTHROID, LEVOTHROID) 50 MCG tablet Take 50 mcg by mouth daily before breakfast.   Yes [provider]  metoprolol tartrate (LOPRESSOR) 25 MG tablet Take 50 mg by mouth 2 (two) times daily.  06/27/14  Yes [provider]  nitroGLYCERIN (NITROSTAT) 0.4 MG SL tablet Place 0.4 mg under the tongue every 5 (five) minutes as needed for chest pain.  08/07/17  Yes [provider]  potassium chloride SA (K-DUR,KLOR-CON) 20 MEQ tablet Take 20 mEq by mouth  daily. 06/16/18  Yes [provider]  REPATHA SURECLICK 893 MG/ML SOAJ Inject 140 mg into the skin every 14 (fourteen) days. 08/18/18  Yes [provider]  tiZANidine (ZANAFLEX) 4 MG tablet Take 4 mg by mouth at bedtime as needed for muscle spasms. 07/03/18  Yes [provider]  linaclotide Rolan Lipa) 290 MCG CAPS capsule Take 1 capsule (290 mcg total) by mouth daily before breakfast. Patient not taking: Reported on 08/24/2018 03/02/18   Jackquline Denmark, MD  linaclotide Rolan Lipa) 290 MCG CAPS capsule Take 1 capsule (290 mcg total) by mouth daily before breakfast. Patient not taking: Reported on 08/24/2018 03/02/18   Jackquline Denmark, MD  Respiratory Therapy Supplies (FLUTTER) DEVI Use as directed. 04/10/18   [name], MD     Review of Systems  Positive ROS: As above  All other systems have been reviewed and were otherwise negative with the exception of those mentioned in the HPI and as above.  Objective: Vital signs in last 24 hours: Temp:  [97.7 F (36.5 C)] 97.7 F (36.5 C) (03/16 1027) Pulse Rate:  [80] 80 (03/16 1027) Resp:  [20] 20 (03/16 1027) BP: (132)/(80) 132/80 (03/16 1027) SpO2:  [96 %] 96 % (03/16 1027) Weight:  [76.2 kg] 76.2 kg (03/16 1027) Estimated body mass index is 27.53 kg/m as calculated from the following:   Height as of this encounter: 5' 5.5" (1.664 m).   Weight as of this encounter: 76.2 kg.   General Appearance: Alert, obese Head: Normocephalic, without obvious abnormality, atraumatic Eyes: PERRL, conjunctiva/corneas clear, EOM's intact,    Ears: Normal  Throat: Normal  Neck: Supple, Back: The patient's lumbar incision is well-healed. Lungs: Clear to auscultation bilaterally, respirations unlabored Heart: Regular rate and rhythm, no murmur, rub or gallop Abdomen: Soft, non-tender Extremities: Extremities normal, atraumatic, no cyanosis or edema Skin: unremarkable  NEUROLOGIC:   Mental status: alert and oriented,Motor Exam -  grossly normal Sensory Exam - grossly normal Reflexes:  Coordination - grossly normal Gait - grossly normal Balance - grossly normal Cranial Nerves: I: smell Not tested  II: visual acuity  OS: Normal  OD: Normal   II: visual fields Full to confrontation  II: pupils Equal, round, reactive to light  III,VII: ptosis None  III,IV,VI: extraocular muscles  Full ROM  V: mastication Normal  V: facial light touch sensation  Normal  V,VII: corneal reflex  Present  VII: facial muscle function - upper  Normal  VII: facial muscle function - lower Normal  VIII: hearing Not tested  IX: soft palate elevation  Normal  IX,X: gag reflex Present  XI:  trapezius strength  5/5  XI: sternocleidomastoid strength 5/5  XI: neck flexion strength  5/5  XII: tongue strength  Normal    Data Review Lab Results  Component Value Date   WBC 8.3 08/27/2018   HGB 13.1 08/27/2018   HCT 41.8 08/27/2018   MCV 95.7 08/27/2018   PLT 244 08/27/2018   Lab Results  Component Value Date   NA 138 08/27/2018   K 4.3 08/27/2018   CL 102 08/27/2018   CO2 28 08/27/2018   BUN 15 08/27/2018   CREATININE 0.87 08/27/2018   GLUCOSE 102 (H) 08/27/2018   No results found for: INR, PROTIME  Assessment/Plan: L3-4 spinal stenosis, lumbago, lumbar radiculopathy, neurogenic claudication: I have discussed the situation with the patient.  I have reviewed her imaging studies with her and pointed out the abnormalities.  We have discussed the various treatment options including surgery.  I have described the surgical treatment option of an exploration of her lumbar fusion with L3-4 decompression, instrumentation and fusion.  I have shown her surgical models.  I have given her a surgical pamphlet.  We have discussed the risks, benefits, alternatives, expected postoperative course, and likelihood of achieving our goals with surgery.  I have answered all the patient's questions.  She has decided to proceed with surgery.   Ophelia Charter 09/03/2018 12:08 PM

## 2018-09-03 NOTE — Anesthesia Procedure Notes (Signed)
Procedure Name: Intubation Date/Time: 09/03/2018 12:59 PM Performed by: Raenette Rover, CRNA Pre-anesthesia Checklist: Patient identified, Emergency Drugs available, Suction available and Patient being monitored Patient Re-evaluated:Patient Re-evaluated prior to induction Oxygen Delivery Method: Circle system utilized Preoxygenation: Pre-oxygenation with 100% oxygen Induction Type: IV induction Ventilation: Mask ventilation without difficulty Laryngoscope Size: Miller and 2 Grade View: Grade I Tube type: Oral Tube size: 7.0 mm Number of attempts: 1 Airway Equipment and Method: Stylet Placement Confirmation: ETT inserted through vocal cords under direct vision,  positive ETCO2,  CO2 detector and breath sounds checked- equal and bilateral Secured at: 21 cm Tube secured with: Tape Dental Injury: Teeth and Oropharynx as per pre-operative assessment

## 2018-09-03 NOTE — Transfer of Care (Signed)
Immediate Anesthesia Transfer of Care Note  Patient: Courtney [name]  Procedure(s) Performed: Lumbar Three-Four Posterior lumbar interbody fusion/interbody prosthesis/posterior instrumentation (N/A Spine Lumbar)  Patient Location: PACU  Anesthesia Type:General  Level of Consciousness: awake, alert , oriented and patient cooperative  Airway & Oxygen Therapy: Patient Spontanous Breathing and Patient connected to nasal cannula oxygen  Post-op Assessment: Report given to RN and Post -op Vital signs reviewed and stable  Post vital signs: Reviewed and stable  Last Vitals:  Vitals Value Taken Time  BP    Temp    Pulse    Resp    SpO2      Last Pain:  Vitals:   09/03/18 1043  TempSrc:   PainSc: 0-No pain      Patients Stated Pain Goal: 3 (73/34/48 3015)  Complications: No apparent anesthesia complications

## 2018-09-03 NOTE — Progress Notes (Signed)
Subjective: The patient is alert and pleasant.  She wants the catheter removed.  She looks well.  Objective: Vital signs in last 24 hours: Temp:  [97.7 F (36.5 C)] 97.7 F (36.5 C) (03/16 1641) Pulse Rate:  [80-104] 104 (03/16 1641) Resp:  [16-20] 16 (03/16 1641) BP: (132-137)/(74-80) 137/74 (03/16 1641) SpO2:  [96 %-100 %] 100 % (03/16 1641) Weight:  [76.2 kg] 76.2 kg (03/16 1027) Estimated body mass index is 27.53 kg/m as calculated from the following:   Height as of this encounter: 5' 5.5" (1.664 m).   Weight as of this encounter: 76.2 kg.   Intake/Output from previous day: No intake/output data recorded. Intake/Output this shift: Total I/O In: 1100 [I.V.:1100] Out: 485 [Urine:285; Blood:200]  Physical exam the patient is alert and pleasant.  She is moving her lower extremities well.  Lab Results: No results for input(s): WBC, HGB, HCT, PLT in the last 72 hours. BMET No results for input(s): NA, K, CL, CO2, GLUCOSE, BUN, CREATININE, CALCIUM in the last 72 hours.  Studies/Results: Dg Lumbar Spine 2-3 Views  Result Date: 09/03/2018 CLINICAL DATA:  73 year old female for L3-4 laminectomy and fusion. Initial encounter. EXAM: LUMBAR SPINE - 2-3 VIEW; DG C-ARM 61-120 MIN Fluoroscopic time: 11 seconds. COMPARISON:  03/06/2018 plain film exam.  07/28/2017 MR. FINDINGS: Two intraoperative C-arm views submitted for review after surgery. Prior L4-5 fusion with slight anterior slip L4 and relative posterior position of the interbody spacer. Removal of posterior connecting bar. New L3 bilateral pedicle screws and L3-4 interspace have been placed. Fusion can be assessed on follow-up standard imaging. IMPRESSION: Remote L4-5 and recent L3-4 fusion as noted above. Electronically Signed   By: Genia Del M.D.   On: 09/03/2018 16:29   Dg C-arm 1-60 Min  Result Date: 09/03/2018 CLINICAL DATA:  73 year old female for L3-4 laminectomy and fusion. Initial encounter. EXAM: LUMBAR SPINE - 2-3  VIEW; DG C-ARM 61-120 MIN Fluoroscopic time: 11 seconds. COMPARISON:  03/06/2018 plain film exam.  07/28/2017 MR. FINDINGS: Two intraoperative C-arm views submitted for review after surgery. Prior L4-5 fusion with slight anterior slip L4 and relative posterior position of the interbody spacer. Removal of posterior connecting bar. New L3 bilateral pedicle screws and L3-4 interspace have been placed. Fusion can be assessed on follow-up standard imaging. IMPRESSION: Remote L4-5 and recent L3-4 fusion as noted above. Electronically Signed   By: Genia Del M.D.   On: 09/03/2018 16:29    Assessment/Plan: The patient is doing well.  We will remove her catheter as requested.  LOS: 0 days     Courtney [name] 09/03/2018, 4:59 PM

## 2018-09-03 NOTE — Progress Notes (Signed)
Orthopedic Tech Progress Note Patient Details:  Courtney [name] 02/13/1946 [id number]  Patient ID: Caesar Bookman, female   DOB: 15-Jun-1946, 73 y.o.   MRN: [id number]   [place] Pink 09/03/2018, 5:09 PMCalled [employer] for Lumbar brace

## 2018-09-03 NOTE — Op Note (Signed)
Brief history: The patient is a 73 year old white female on whom I performed an L4-5 decompression, instrumentation and fusion in the past.  She has developed recurrent back and right greater than left leg pain.  She has failed medical management.  She was worked up with a lumbar MRI and lumbar x-rays which demonstrated a gliosis and L3-4 spinal stenosis.  I discussed the various treatment options with the patient including surgery.  She has weighed the risks, benefits and alternatives surgery and decided proceed with an exploration of her lumbar fusion and L3-4 decompression, instrumentation, and fusion.  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  Procedure: Bilateral L3-4 laminotomy/foraminotomies/medial facetectomy to decompress the bilateral L3 and L4 nerve roots(the work required to do this was in addition to the work required to do the posterior lumbar interbody fusion because of the patient's spinal stenosis, facet arthropathy. Etc. requiring a wide decompression of the nerve roots.);  L3-4 transforaminal lumbar interbody fusion with local morselized autograft bone, bone morphogenic protein soaked collagen sponges and Zimmer DBM; insertion of interbody prosthesis at L3-4 (globus peek expandable interbody prosthesis); posterior segmental instrumentation from L3 to L5 with globus titanium pedicle screws and rods; posterior lateral arthrodesis at L3-4 and L4-5 with local morselized autograft bone, bone morphogenic protein soaked collagen sponges and Zimmer DBM; exploration of lumbar fusion-removal of lumbar hardware  Surgeon: Dr. Earle Gell  Asst.: Arnetha Massy, RN nurse practitioner  Anesthesia: Gen. endotracheal  Estimated blood loss: 200 cc  Drains: None  Complications: None  Description of  procedure: The patient was brought to the operating room by the anesthesia team. General endotracheal anesthesia was induced. The patient was turned to the prone position on the Wilson frame. The patient's lumbosacral region was then prepared with Betadine scrub and Betadine solution. Sterile drapes were applied.  I then injected the area to be incised with Marcaine with epinephrine solution. I then used the scalpel to make a linear midline incision over the L3-4 and L4-5 interspace. I then used electrocautery to perform a bilateral subperiosteal dissection exposing the spinous process and lamina of L3, L4 and L5, and exposing the old hardware at L4-5.We then inserted the Verstrac retractor to provide exposure.  We explored the fusion by removing the caps and rods at L4-5.  The hardware was intact and the screws were not loose, but the posterior lateral arthrodesis did not appear solid.  I began the decompression by using the high speed drill to perform laminotomies at L3-4 bilaterally. We then used the Kerrison punches to widen the laminotomy and removed the ligamentum flavum at L3-4 bilaterally. We used the Kerrison punches to remove the medial facets at L3-4 bilaterally. We performed wide foraminotomies about the bilateral L3 and L4 nerve roots completing the decompression.  We now turned our attention to the posterior lumbar interbody fusion. I used a scalpel to incise the intervertebral disc at L3-4 bilaterally. I then performed a partial intervertebral discectomy at L3-4 bilaterally using the pituitary forceps. We prepared the vertebral endplates at K8-1 bilaterally for the fusion by removing the soft tissues with the curettes. We then used the trial spacers to pick the appropriate sized interbody prosthesis. We prefilled his prosthesis with a combination of local morselized autograft bone that we obtained during the decompression as well as Zimmer DBM. We inserted the prefilled prosthesis into the  interspace at L3-4  from the right, we then turned and expanded the prosthesis. There was a good snug fit of the prosthesis in the interspace. We then filled and the remainder of the intervertebral disc space with local morselized autograft bone and Zimmer DBM. This completed the posterior lumbar interbody arthrodesis.  We now turned attention to the instrumentation. Under fluoroscopic guidance we cannulated the bilateral L3 pedicles with the bone probe. We then removed the bone probe. We then tapped the pedicle with a 6.5 millimeter tap. We then removed the tap. We probed inside the tapped pedicle with a ball probe to rule out cortical breaches. We then inserted a 7.5 x 55 millimeter pedicle screw into the L3 pedicles bilaterally under fluoroscopic guidance. We then palpated along the medial aspect of the pedicles to rule out cortical breaches. There were none. The nerve roots were not injured. We then connected the unilateral pedicle screws with a lordotic rod. We compressed the construct and secured the rod in place with the caps. We then tightened the caps appropriately. This completed the instrumentation from L3-L5 bilaterally.  We now turned our attention to the posterior lateral arthrodesis at L3-4 and L4-5. We used the high-speed drill to decorticate the remainder of the facets, pars, transverse process at L3-4 and L4-5. We then applied a combination of local morselized autograft bone and Zimmer DBM over these decorticated posterior lateral structures. This completed the posterior lateral arthrodesis.  We then obtained hemostasis using bipolar electrocautery. We irrigated the wound out with bacitracin solution. We inspected the thecal sac and nerve roots and noted they were well decompressed. We then removed the retractor. We placed vancomycin powder in the wound.  We injected Exparel . We reapproximated patient's thoracolumbar fascia with interrupted #1 Vicryl suture. We reapproximated patient's  subcutaneous tissue with interrupted 2-0 Vicryl suture. The reapproximated patient's skin with Steri-Strips and benzoin. The wound was then coated with bacitracin ointment. A sterile dressing was applied. The drapes were removed. The patient was subsequently returned to the supine position where they were extubated by the anesthesia team. He was then transported to the post anesthesia care unit in stable condition. All sponge instrument and needle counts were reportedly correct at the end of this case.

## 2018-09-03 NOTE — Anesthesia Postprocedure Evaluation (Signed)
Anesthesia Post Note  Patient: Courtney [name]  Procedure(s) Performed: Lumbar Three-Four Posterior lumbar interbody fusion/interbody prosthesis/posterior instrumentation (N/A Spine Lumbar)     Patient location during evaluation: PACU Anesthesia Type: General Level of consciousness: awake and alert Pain management: pain level controlled Vital Signs Assessment: post-procedure vital signs reviewed and stable Respiratory status: spontaneous breathing, nonlabored ventilation, respiratory function stable and patient connected to nasal cannula oxygen Cardiovascular status: blood pressure returned to baseline and stable Postop Assessment: no apparent nausea or vomiting Anesthetic complications: no    Last Vitals:  Vitals:   09/03/18 1730 09/03/18 1735  BP: 123/69   Pulse: 78 77  Resp: 12 16  Temp: (!) 36.1 C   SpO2: 95% 96%    Last Pain:  Vitals:   09/03/18 1800  TempSrc:   PainSc: [place] Arick Mareno

## 2018-09-04 DIAGNOSIS — M48062 Spinal stenosis, lumbar region with neurogenic claudication: Secondary | ICD-10-CM | POA: Diagnosis not present

## 2018-09-04 LAB — CBC
HCT: 34.4 % — ABNORMAL LOW (ref 36.0–46.0)
Hemoglobin: 10.9 g/dL — ABNORMAL LOW (ref 12.0–15.0)
MCH: 29.5 pg (ref 26.0–34.0)
MCHC: 31.7 g/dL (ref 30.0–36.0)
MCV: 93.2 fL (ref 80.0–100.0)
PLATELETS: 230 10*3/uL (ref 150–400)
RBC: 3.69 MIL/uL — ABNORMAL LOW (ref 3.87–5.11)
RDW: 13.5 % (ref 11.5–15.5)
WBC: 13.4 10*3/uL — ABNORMAL HIGH (ref 4.0–10.5)
nRBC: 0 % (ref 0.0–0.2)

## 2018-09-04 LAB — BASIC METABOLIC PANEL
Anion gap: 9 (ref 5–15)
BUN: 16 mg/dL (ref 8–23)
CO2: 24 mmol/L (ref 22–32)
Calcium: 8.8 mg/dL — ABNORMAL LOW (ref 8.9–10.3)
Chloride: 99 mmol/L (ref 98–111)
Creatinine, Ser: 1.02 mg/dL — ABNORMAL HIGH (ref 0.44–1.00)
GFR calc Af Amer: >60 mL/min (ref >60)
GFR, EST NON AFRICAN AMERICAN: 55 mL/min — AB (ref >60)
Glucose, Bld: 141 mg/dL — ABNORMAL HIGH (ref 70–99)
Potassium: 5 mmol/L (ref 3.5–5.1)
Sodium: 132 mmol/L — ABNORMAL LOW (ref 135–145)

## 2018-09-04 MED ORDER — OXYCODONE HCL 10 MG PO TABS: 10.0000 mg | ORAL_TABLET | ORAL | 0 refills | Status: DC | PRN

## 2018-09-04 MED ORDER — DOCUSATE SODIUM 100 MG PO CAPS: 100.0000 mg | ORAL_CAPSULE | Freq: Two times a day (BID) | ORAL | 0 refills | Status: DC

## 2018-09-04 MED ORDER — PROPOFOL 500 MG/50ML IV EMUL
INTRAVENOUS | Status: AC
  Filled 2018-09-04: qty 100

## 2018-09-04 MED ORDER — PROPOFOL 1000 MG/100ML IV EMUL
INTRAVENOUS | Status: AC
  Filled 2018-09-04: qty 200

## 2018-09-04 MED ORDER — CYCLOBENZAPRINE HCL 10 MG PO TABS: 10.0000 mg | ORAL_TABLET | Freq: Three times a day (TID) | ORAL | 1 refills | Status: DC | PRN

## 2018-09-04 NOTE — Progress Notes (Signed)
Pt and husband given D/C instructions with verbal understanding. Pt's Rx's were sent to pharmacy by MD. Pt's incision is clean and dry with no sign of infection. Pt's IV was removed prior to D/C. Pt D/C'd home via wheelchair per MD order. Pt is stable @ D/C and has no other needs at this time. Holli Humbles, RN

## 2018-09-04 NOTE — Discharge Summary (Signed)
Physician Discharge Summary  Patient ID: Courtney [name] MRN: [id number] DOB/AGE: 73-Mar-1947 73 y.o.  Admit date: 09/03/2018 Discharge date: 09/04/2018  Admission Diagnoses: Lumbar spinal stenosis, lumbar radiculopathy, neurogenic claudication, lumbago, lumbar scoliosis  Discharge Diagnoses: The same Active Problems:   Spinal stenosis of lumbar region with neurogenic claudication   Discharged Condition: good  Hospital Course: I performed an L3-4 and L4-5 decompression, instrumentation and fusion on the patient on 09/03/2018.  The patient's postoperative course was unremarkable.  On postoperative day #1 the patient, and her husband, requested discharge to home.  They were given written and oral discharge instructions.  All their questions were answered.  Consults: Physical therapy, Occupational Therapy Significant Diagnostic Studies: None Treatments: Exploration of lumbar fusion/removal of lumbar hardware, L3-4 and L4-5 decompression, instrumentation and fusion. Discharge Exam: Blood pressure 103/81, pulse 88, temperature 98.7 F (37.1 C), temperature source Oral, resp. rate 18, height 5' 5.5" (1.664 m), weight 76.2 kg, SpO2 96 %. The patient is alert and pleasant.  She looks well.  Her strength is normal.  Disposition: Home  Discharge Instructions    Call MD for:  difficulty breathing, headache or visual disturbances   Complete by:  As directed    Call MD for:  extreme fatigue   Complete by:  As directed    Call MD for:  hives   Complete by:  As directed    Call MD for:  persistant dizziness or light-headedness   Complete by:  As directed    Call MD for:  persistant nausea and vomiting   Complete by:  As directed    Call MD for:  redness, tenderness, or signs of infection (pain, swelling, redness, odor or green/yellow discharge around incision site)   Complete by:  As directed    Call MD for:  severe uncontrolled pain   Complete by:  As directed    Call MD for:  temperature  >100.4   Complete by:  As directed    Diet - low sodium heart healthy   Complete by:  As directed    Discharge instructions   Complete by:  As directed    Call [phone] for a followup appointment. Take a stool softener while you are using pain medications.   Driving Restrictions   Complete by:  As directed    Do not drive for 2 weeks.   Increase activity slowly   Complete by:  As directed    Lifting restrictions   Complete by:  As directed    Do not lift more than 5 pounds. No excessive bending or twisting.   May shower / Bathe   Complete by:  As directed    Remove the dressing for 3 days after surgery.  You may shower, but leave the incision alone.   Remove dressing in 48 hours   Complete by:  As directed    Your stitches are under the scan and will dissolve by themselves. The Steri-Strips will fall off after you take a few showers. Do not rub back or pick at the wound, Leave the wound alone.     Allergies as of 09/04/2018      Reactions   Levofloxacin Hives, Shortness Of Breath, Swelling   RASH IN MOUTH   (can take IV route)   Prednisone Itching   Statins Other (See Comments)   Myalgias   Gabapentin Nausea Only      Medication List    STOP taking these medications   HYDROcodone-acetaminophen 10-325 MG tablet Commonly  known as:  NORCO   ibuprofen 200 MG tablet Commonly known as:  ADVIL,MOTRIN   tiZANidine 4 MG tablet Commonly known as:  ZANAFLEX     TAKE these medications   albuterol 108 (90 Base) MCG/ACT inhaler Commonly known as:  ProAir HFA Inhale 2 puffs into the lungs every 6 (six) hours as needed for wheezing or shortness of breath.   aspirin EC 81 MG tablet Take 81 mg by mouth daily.   cyclobenzaprine 10 MG tablet Commonly known as:  FLEXERIL Take 1 tablet (10 mg total) by mouth 3 (three) times daily as needed for muscle spasms.   docusate sodium 100 MG capsule Commonly known as:  COLACE Take 1 capsule (100 mg total) by mouth 2 (two) times  daily.   fluticasone 50 MCG/ACT nasal spray Commonly known as:  FLONASE Place 2 sprays into both nostrils daily as needed for allergies.   fluticasone furoate-vilanterol 200-25 MCG/INH Aepb Commonly known as:  Breo Ellipta Inhale 1 puff into the lungs daily.   Flutter Devi Use as directed.   furosemide 20 MG tablet Commonly known as:  LASIX Take 20 mg by mouth daily.   levothyroxine 50 MCG tablet Commonly known as:  SYNTHROID, LEVOTHROID Take 50 mcg by mouth daily before breakfast.   linaclotide 290 MCG Caps capsule Commonly known as:  Linzess Take 1 capsule (290 mcg total) by mouth daily before breakfast.   linaclotide 290 MCG Caps capsule Commonly known as:  Linzess Take 1 capsule (290 mcg total) by mouth daily before breakfast.   metoprolol tartrate 25 MG tablet Commonly known as:  LOPRESSOR Take 50 mg by mouth 2 (two) times daily.   nitroGLYCERIN 0.4 MG SL tablet Commonly known as:  NITROSTAT Place 0.4 mg under the tongue every 5 (five) minutes as needed for chest pain.   Oxycodone HCl 10 MG Tabs Take 1 tablet (10 mg total) by mouth every 4 (four) hours as needed for severe pain ((score 7 to 10)).   potassium chloride SA 20 MEQ tablet Commonly known as:  K-DUR,KLOR-CON Take 20 mEq by mouth daily.   Repatha SureClick 195 MG/ML Soaj Generic drug:  Evolocumab Inject 140 mg into the skin every 14 (fourteen) days.        Signed: Ophelia Charter 09/04/2018, 7:58 AM

## 2018-09-04 NOTE — Evaluation (Signed)
Occupational Therapy Evaluation Patient Details Name: Courtney [name] MRN: [id number] DOB: [birth year] Today's Date: 09/04/2018    History of Present Illness 73 yo female s/p L3-4 and L4-5 decompression. PMH including HTN, SVT, depression, and OA.    Clinical Impression   PTA, pt was living with her husband and was independent. Currently, pt requires Min Guard A for LB ADLs with AE, Min Guard A for functional mobility, and Min A for tub transfer. Provided education and handout on back precautions, sleep positioning, grooming, brace management, LB ADLs with AE, toileting, and tub transfer; pt demonstrated and verbalized understanding. Pt with decreased balance during tub transfer and required Min A. Recommended and discussed use of tub bench for transfer and pt agrees; reports she would like to buy bench on her own. Answered all pt questions. Recommend dc home once medically stable per physician. All acute OT needs met and will sign off. Thank you.     Follow Up Recommendations  No OT follow up;Supervision/Assistance - 24 hour    Equipment Recommendations  Tub/shower bench(Pt wants to purchase on her own)    Recommendations for Other Services PT consult     Precautions / Restrictions Precautions Precautions: Fall;Back Precaution Booklet Issued: Yes (comment) Precaution Comments: Reviewed all back precautions and compensatory techniques for adherance during ADLs      Mobility Bed Mobility               General bed mobility comments: OOB upon arrival  Transfers Overall transfer level: Needs assistance   Transfers: Sit to/from Stand Sit to Stand: Supervision;Min guard         General transfer comment: supervision-Min Guard A for safety    Balance Overall balance assessment: Mild deficits observed, not formally tested                                         ADL either performed or assessed with clinical judgement   ADL Overall ADL's : Needs  assistance/impaired Eating/Feeding: Independent   Grooming: Supervision/safety;Set up;Standing Grooming Details (indicate cue type and reason): Providing education on grooming techniques for adherance during ADLs Upper Body Bathing: Supervision/ safety;Set up;Sitting   Lower Body Bathing: Min guard;Sit to/from stand   Upper Body Dressing : Supervision/safety;Set up;Sitting Upper Body Dressing Details (indicate cue type and reason): Pt donning brace and demonstrating understanding Lower Body Dressing: With caregiver independent assisting;With adaptive equipment;Sit to/from stand;Min guard Lower Body Dressing Details (indicate cue type and reason): Educating pt on use of AE for LB dressing. Pt demonstrating understanding. However, would like husband to assist with socks and shoes at [employer] Transfer: Min guard;Ambulation(simulated in room)     Toileting - Clothing Manipulation Details (indicate cue type and reason): Educating pt on toielt hygiene techniques Tub/ Shower Transfer: Tub transfer;Minimal assistance;Ambulation Tub/Shower Transfer Details (indicate cue type and reason): Educating pt on safe tub transfer. Pt requiring Min A for balance and demonstrating decreased balance. Discussed purchase of tub bench and pt reporting she would like to purchase on her own  Functional mobility during ADLs: Min guard General ADL Comments: Providing education on back precautions, grooming, bed mobility, sleep positioning, LB ADLs, toileting, and tub transfer.      Vision Baseline Vision/History: Wears glasses Patient Visual Report: No change from baseline       Perception     Praxis      Pertinent Vitals/Pain Pain Assessment:  Faces Faces Pain Scale: Hurts little more Pain Location: Back Pain Descriptors / Indicators: Constant;Discomfort Pain Intervention(s): Monitored during session;Repositioned     Hand Dominance Right   Extremity/Trunk Assessment Upper Extremity Assessment Upper  Extremity Assessment: Overall WFL for tasks assessed   Lower Extremity Assessment Lower Extremity Assessment: Defer to PT evaluation   Cervical / Trunk Assessment Cervical / Trunk Assessment: Other exceptions Cervical / Trunk Exceptions: s/p back sx   Communication Communication Communication: No difficulties   Cognition Arousal/Alertness: Awake/alert Behavior During Therapy: WFL for tasks assessed/performed Overall Cognitive Status: Within Functional Limits for tasks assessed                                     General Comments  Husband present throughout    Exercises     Shoulder Instructions      Home Living Family/patient expects to be discharged to:: Private residence Living Arrangements: Spouse/significant other Available Help at Discharge: Family;Available 24 hours/day Type of Home: House Home Access: Level entry     Home Layout: Two level;Able to live on main level with bedroom/bathroom     Bathroom Shower/Tub: Tub/shower unit;Walk-in shower(walk in shower upstairs)   Bathroom Toilet: Standard     Home Equipment: Environmental consultant - 2 wheels;Cane - single point          Prior Functioning/Environment Level of Independence: Independent                 OT Problem List: Decreased range of motion;Decreased activity tolerance;Impaired balance (sitting and/or standing);Decreased knowledge of use of DME or AE;Decreased knowledge of precautions;Pain      OT Treatment/Interventions:      OT Goals(Current goals can be found in the care plan section) Acute Rehab OT Goals Patient Stated Goal: Go home OT Goal Formulation: All assessment and education complete, DC therapy  OT Frequency:     Barriers to D/C:            Co-evaluation              AM-PAC OT "6 Clicks" Daily Activity     Outcome Measure Help from another person eating meals?: None Help from another person taking care of personal grooming?: A Little Help from another person  toileting, which includes using toliet, bedpan, or urinal?: A Little Help from another person bathing (including washing, rinsing, drying)?: A Little Help from another person to put on and taking off regular upper body clothing?: None Help from another person to put on and taking off regular lower body clothing?: A Little 6 Click Score: 20   End of Session Equipment Utilized During Treatment: Back brace Nurse Communication: Mobility status  Activity Tolerance: Patient tolerated treatment well Patient left: in bed;with call bell/phone within reach;with family/visitor present(At EOB)  OT Visit Diagnosis: Unsteadiness on feet (R26.81);Muscle weakness (generalized) (M62.81);Pain Pain - part of body: (Back)                Time: 9826-4158 OT Time Calculation (min): 26 min Charges:  OT General Charges $OT Visit: 1 Visit OT Evaluation $OT Eval Low Complexity: 1 Low OT Treatments $Self Care/Home Management : 8-22 mins  Draken Farrior MSOT, OTR/L Acute Rehab Pager: [phone] Office: [place] 09/04/2018, 9:33 AM

## 2018-09-04 NOTE — Care Management Obs Status (Signed)
[facility] NOTIFICATION   Patient Details  Name: ANHTHU PERDEW MRN: [id number] Date of Birth: [birth year]   Medicare Observation Status Notification Given:  Other (see comment)(patient was discharged prior to Magdalena getting signature)    Ninfa Meeker, RN 09/04/2018, 10:30 AM

## 2018-09-04 NOTE — Discharge Instructions (Signed)
Call MD for: Difficulty breathing, headache or visual disturbances, extreme fatigue  Call MD for: hives  Call MD for: persistant dizziness or light-headedness  Call MD for: persistant nausea and vomiting  Call MD for: redness, tenderness, or signs of infection (pain, swelling, redness, odor or green/yellow discharge around incision site)  Call MD for: severe uncontrolled pain  Call MD for: temperature >100.4 Diet - low sodium heart healthy Discharge instructions   Call [phone] for a followup appointment.   Increase activity slowly Remove dressing in 48 hours     Spinal Fusion, Adult, Care After This sheet gives you information about how to care for yourself after your procedure. Your doctor may also give you more specific instructions. If you have problems or questions, contact your doctor. Follow these instructions at home: Medicines  Take over-the-counter and prescription medicines only as told by your doctor. These include any medicines for pain or blood-thinning medicines (anticoagulants).  If you were prescribed an antibiotic medicine, take it as told by your doctor. Do not stop taking the antibiotic even if you start to feel better.  Do not drive for 24 hours if you were given a medicine to help you relax (sedative) during your procedure.  Do not drive or use heavy machinery while taking prescription pain medicine. If you have a brace:  Wear the brace as told by your doctor. Take it off only as told by your doctor.  Keep the brace clean. Managing pain, stiffness, and swelling  If directed, put ice on the surgery area: ? If you have a removable brace, take it off as told by your doctor. ? Put ice in a plastic bag. ? Place a towel between your skin and the bag. ? Leave the ice on for 20 minutes, 2-3 times a day. Surgery cut care   Follow instructions from your doctor about how to take care of your cut from surgery (incision). Make sure you: ? Wash your hands with  soap and water before you change your bandage (dressing). If you cannot use soap and water, use hand sanitizer. ? Change your bandage as told by your doctor. ? Leave stitches (sutures), skin glue, or skin tape (adhesive) strips in place. They may need to stay in place for 2 weeks or longer. If tape strips get loose and curl up, you may trim the loose edges. Do not remove tape strips completely unless your doctor says it is okay.  Keep your cut from surgery clean and dry. ? Do not take baths, swim, or use a hot tub until your doctor says it is okay. ? Ask your doctor if you can take showers. You may only be allowed to take sponge baths.  Every day, check your cut from surgery and the area around it for: ? More redness, swelling, or pain. ? Fluid or blood. ? Warmth. ? Pus or a bad smell.  If you have a drain tube, follow instructions from your doctor about caring for it. Do not take out the drain tube or any bandages unless your doctor says it is okay. Physical activity  Rest and protect your back as much as possible.  Follow instructions from your doctor about how to move. Use good posture to help your spine heal.  Do not lift anything that is heavier than 8 lb (3.6 kg), or the limit that you are told, until your doctor says that it is safe.  Do not twist or bend at the waist until your doctor says it  is okay.  It is best if you: ? Do not make pushing and pulling motions. ? Do not sit or lie down in the same position for a long time. ? Do not raise your hands or arms above your head.  Return to your normal activities as told by your doctor. Ask your doctor what activities are safe for you. Rest and protect your back as much as you can.  Do not start to exercise until your doctor says it is okay. Ask your doctor what kinds of exercise you can do to make your back stronger. General instructions  To prevent blood clots and lessen swelling in your legs: ? Wear compression stockings as  told. ? Walk one or more times every few hours as told by your doctor.  Do not use any products that contain nicotine or tobacco, such as cigarettes and e-cigarettes. These can delay bone healing. If you need help quitting, ask your doctor.  To prevent or treat constipation while you are taking prescription pain medicine, your doctor may suggest that you: ? Drink enough fluid to keep your pee (urine) pale yellow. ? Take over-the-counter or prescription medicines. ? Eat foods that are high in fiber. These include fresh fruits and vegetables, whole grains, and beans. ? Limit foods that are high in fat and processed sugars, such as fried and sweet foods.  Keep all follow-up visits as told by your doctor. This is important. Contact a doctor if:  Your pain gets worse.  Your medicine does not help your pain.  Your legs or feet get painful or swollen.  Your cut from surgery is more red, swollen, or painful.  Your cut from surgery feels warm to the touch.  You have: ? Fluid or blood coming from your cut from surgery. ? Pus or a bad smell coming from your cut from surgery. ? A fever. ? Weakness or loss of feeling (numbness) in your legs that is new or getting worse. ? Trouble controlling when you pee (urinate) or poop (have a bowel movement).  You feel sick to your stomach (nauseous).  You throw up (vomit). Get help right away if:  Your pain is very bad.  You have chest pain.  You have trouble breathing.  You start to have a cough. These symptoms may be an emergency. Do not wait to see if the symptoms will go away. Get medical help right away. Call your local emergency services (911 in the [place] Do not drive yourself to the hospital. Summary  After the procedure, it is common to have pain in your back and pain by your surgery cut(s).  Icing and pain medicines may help to control the pain. Follow directions from your doctor.  Rest and protect your back as much as possible. Do  not twist or bend at the waist.  Get up and walk one or more times every few hours as told by your doctor. This information is not intended to replace advice given to you by your health care provider. Make sure you discuss any questions you have with your health care provider. Document Released: 09/30/2010 Document Revised: 09/20/2016 Document Reviewed: 09/20/2016 Elsevier Interactive Patient Education  2019 [employer].

## 2018-09-04 NOTE — Progress Notes (Signed)
Physical Therapy Treatment and Discharge Patient Details Name: Courtney [name] MRN: [id number] DOB: 1946-01-15 Today's Date: 09/04/2018    History of Present Illness 73 yo female s/p L3-4 and L4-5 decompression. PMH including HTN, SVT, depression, and OA.     PT Comments    Patient evaluated by Physical Therapy with no further acute PT needs identified. All education has been completed and the patient has no further questions. At the time of PT eval pt was able to perform transfers and ambulation with gross modified independence to supervision for safety. Pt educated on precautions, brace application/wearing schedule, activity progression, positioning recommendations, and car transfer. See below for any follow-up Physical Therapy or equipment needs. PT is signing off. Thank you for this referral.     Follow Up Recommendations  No PT follow up;Supervision - Intermittent     Equipment Recommendations  None recommended by PT    Recommendations for Other Services       Precautions / Restrictions Precautions Precautions: Fall;Back Precaution Booklet Issued: Yes (comment) Precaution Comments: Reviewed all back precautions and compensatory techniques for adherance during ADLs Restrictions Weight Bearing Restrictions: No    Mobility  Bed Mobility               General bed mobility comments: OOB upon arrival  Transfers Overall transfer level: Needs assistance   Transfers: Sit to/from Stand Sit to Stand: Supervision         General transfer comment: Pt reports preferring to stand throughout  Ambulation/Gait Ambulation/Gait assistance: Modified independent (Device/Increase time) Gait Distance (Feet): 200 Feet Assistive device: None Gait Pattern/deviations: Step-through pattern;Decreased stride length;Trunk flexed Gait velocity: Decreased Gait velocity interpretation: 1.31 - 2.62 ft/sec, indicative of limited community ambulator General Gait Details: Slow but generally  steady. Pt ambulating in hall with husband on arrival.    Stairs             Wheelchair Mobility    Modified Rankin (Stroke Patients Only)       Balance Overall balance assessment: Mild deficits observed, not formally tested                                          Cognition Arousal/Alertness: Awake/alert Behavior During Therapy: WFL for tasks assessed/performed Overall Cognitive Status: Within Functional Limits for tasks assessed                                        Exercises      General Comments General comments (skin integrity, edema, etc.): Husband present throughout      Pertinent Vitals/Pain Pain Assessment: Faces Faces Pain Scale: Hurts little more Pain Location: Back Pain Descriptors / Indicators: Constant;Discomfort Pain Intervention(s): Monitored during session    Home Living Family/patient expects to be discharged to:: Private residence Living Arrangements: Spouse/significant other Available Help at Discharge: Family;Available 24 hours/day Type of Home: House Home Access: Level entry   Home Layout: Two level;Able to live on main level with bedroom/bathroom Home Equipment: Gilford Rile - 2 wheels;Cane - single point      Prior Function Level of Independence: Independent          PT Goals (current goals can now be found in the care plan section) Acute Rehab PT Goals Patient Stated Goal: Go home PT Goal Formulation: All assessment and  education complete, DC therapy    Frequency           PT Plan      Co-evaluation              AM-PAC PT "6 Clicks" Mobility   Outcome Measure  Help needed turning from your back to your side while in a flat bed without using bedrails?: None Help needed moving from lying on your back to sitting on the side of a flat bed without using bedrails?: None Help needed moving to and from a bed to a chair (including a wheelchair)?: None Help needed standing up from a  chair using your arms (e.g., wheelchair or bedside chair)?: None Help needed to walk in hospital room?: None Help needed climbing 3-5 steps with a railing? : A Little 6 Click Score: 23    End of Session Equipment Utilized During Treatment: Back brace Activity Tolerance: Patient tolerated treatment well Patient left: Other (comment)(Standing in room preparing for d/c with husband present) Nurse Communication: Mobility status PT Visit Diagnosis: Unsteadiness on feet (R26.81);Pain;Other symptoms and signs involving the nervous system (R29.898) Pain - part of body: (Back)     Time: 3729-0211 PT Time Calculation (min) (ACUTE ONLY): 10 min  Charges:                        Courtney [name], PT, DPT Acute Rehabilitation Services Pager: [phone] Office: [phone]    Courtney [name] 09/04/2018, 10:57 AM

## 2018-09-05 MED FILL — Sodium Chloride IV Soln 0.9%: INTRAVENOUS | Qty: 1000 | Status: AC

## 2018-09-05 MED FILL — Heparin Sodium (Porcine) Inj 1000 Unit/ML: INTRAMUSCULAR | Qty: 30 | Status: AC

## 2018-09-06 DIAGNOSIS — F331 Major depressive disorder, recurrent, moderate: Secondary | ICD-10-CM | POA: Diagnosis not present

## 2018-09-06 DIAGNOSIS — Z7982 Long term (current) use of aspirin: Secondary | ICD-10-CM | POA: Diagnosis not present

## 2018-09-06 DIAGNOSIS — M25561 Pain in right knee: Secondary | ICD-10-CM | POA: Diagnosis not present

## 2018-09-06 DIAGNOSIS — E039 Hypothyroidism, unspecified: Secondary | ICD-10-CM | POA: Diagnosis not present

## 2018-09-06 DIAGNOSIS — M5416 Radiculopathy, lumbar region: Secondary | ICD-10-CM | POA: Diagnosis not present

## 2018-09-06 DIAGNOSIS — M48062 Spinal stenosis, lumbar region with neurogenic claudication: Secondary | ICD-10-CM | POA: Diagnosis not present

## 2018-09-06 DIAGNOSIS — M25562 Pain in left knee: Secondary | ICD-10-CM | POA: Diagnosis not present

## 2018-09-06 DIAGNOSIS — M4186 Other forms of scoliosis, lumbar region: Secondary | ICD-10-CM | POA: Diagnosis not present

## 2018-09-06 DIAGNOSIS — E785 Hyperlipidemia, unspecified: Secondary | ICD-10-CM | POA: Diagnosis not present

## 2018-09-06 DIAGNOSIS — D649 Anemia, unspecified: Secondary | ICD-10-CM | POA: Diagnosis not present

## 2018-09-06 DIAGNOSIS — M170 Bilateral primary osteoarthritis of knee: Secondary | ICD-10-CM | POA: Diagnosis not present

## 2018-09-06 DIAGNOSIS — Z87440 Personal history of urinary (tract) infections: Secondary | ICD-10-CM | POA: Diagnosis not present

## 2018-09-08 DIAGNOSIS — D649 Anemia, unspecified: Secondary | ICD-10-CM | POA: Diagnosis not present

## 2018-09-08 DIAGNOSIS — M48062 Spinal stenosis, lumbar region with neurogenic claudication: Secondary | ICD-10-CM | POA: Diagnosis not present

## 2018-09-08 DIAGNOSIS — M25561 Pain in right knee: Secondary | ICD-10-CM | POA: Diagnosis not present

## 2018-09-08 DIAGNOSIS — M5416 Radiculopathy, lumbar region: Secondary | ICD-10-CM | POA: Diagnosis not present

## 2018-09-08 DIAGNOSIS — M25562 Pain in left knee: Secondary | ICD-10-CM | POA: Diagnosis not present

## 2018-09-08 DIAGNOSIS — M4186 Other forms of scoliosis, lumbar region: Secondary | ICD-10-CM | POA: Diagnosis not present

## 2018-09-10 DIAGNOSIS — M4186 Other forms of scoliosis, lumbar region: Secondary | ICD-10-CM | POA: Diagnosis not present

## 2018-09-10 DIAGNOSIS — M48062 Spinal stenosis, lumbar region with neurogenic claudication: Secondary | ICD-10-CM | POA: Diagnosis not present

## 2018-09-10 DIAGNOSIS — M5416 Radiculopathy, lumbar region: Secondary | ICD-10-CM | POA: Diagnosis not present

## 2018-09-10 DIAGNOSIS — D649 Anemia, unspecified: Secondary | ICD-10-CM | POA: Diagnosis not present

## 2018-09-10 DIAGNOSIS — M25562 Pain in left knee: Secondary | ICD-10-CM | POA: Diagnosis not present

## 2018-09-10 DIAGNOSIS — M25561 Pain in right knee: Secondary | ICD-10-CM | POA: Diagnosis not present

## 2018-09-19 DIAGNOSIS — R0602 Shortness of breath: Secondary | ICD-10-CM | POA: Diagnosis not present

## 2018-09-19 DIAGNOSIS — E7800 Pure hypercholesterolemia, unspecified: Secondary | ICD-10-CM | POA: Diagnosis not present

## 2018-09-20 DIAGNOSIS — J13 Pneumonia due to Streptococcus pneumoniae: Secondary | ICD-10-CM | POA: Diagnosis not present

## 2018-10-01 DIAGNOSIS — Z0000 Encounter for general adult medical examination without abnormal findings: Secondary | ICD-10-CM | POA: Diagnosis not present

## 2018-10-01 DIAGNOSIS — K769 Liver disease, unspecified: Secondary | ICD-10-CM | POA: Diagnosis not present

## 2018-10-04 DIAGNOSIS — M5137 Other intervertebral disc degeneration, lumbosacral region: Secondary | ICD-10-CM | POA: Diagnosis not present

## 2018-10-04 DIAGNOSIS — M48062 Spinal stenosis, lumbar region with neurogenic claudication: Secondary | ICD-10-CM | POA: Diagnosis not present

## 2018-10-04 DIAGNOSIS — M4316 Spondylolisthesis, lumbar region: Secondary | ICD-10-CM | POA: Diagnosis not present

## 2018-10-04 DIAGNOSIS — M533 Sacrococcygeal disorders, not elsewhere classified: Secondary | ICD-10-CM | POA: Diagnosis not present

## 2018-11-20 DIAGNOSIS — F5101 Primary insomnia: Secondary | ICD-10-CM | POA: Diagnosis not present

## 2018-11-20 DIAGNOSIS — N3000 Acute cystitis without hematuria: Secondary | ICD-10-CM | POA: Diagnosis not present

## 2018-11-20 DIAGNOSIS — Z6828 Body mass index (BMI) 28.0-28.9, adult: Secondary | ICD-10-CM | POA: Diagnosis not present

## 2018-11-20 DIAGNOSIS — E782 Mixed hyperlipidemia: Secondary | ICD-10-CM | POA: Diagnosis not present

## 2018-11-20 DIAGNOSIS — I498 Other specified cardiac arrhythmias: Secondary | ICD-10-CM | POA: Diagnosis not present

## 2018-11-20 DIAGNOSIS — R7301 Impaired fasting glucose: Secondary | ICD-10-CM | POA: Diagnosis not present

## 2018-11-20 DIAGNOSIS — E038 Other specified hypothyroidism: Secondary | ICD-10-CM | POA: Diagnosis not present

## 2018-11-28 DIAGNOSIS — I498 Other specified cardiac arrhythmias: Secondary | ICD-10-CM | POA: Diagnosis not present

## 2018-11-28 DIAGNOSIS — M79662 Pain in left lower leg: Secondary | ICD-10-CM | POA: Diagnosis not present

## 2018-11-28 DIAGNOSIS — M79661 Pain in right lower leg: Secondary | ICD-10-CM | POA: Diagnosis not present

## 2018-11-28 DIAGNOSIS — M7918 Myalgia, other site: Secondary | ICD-10-CM | POA: Diagnosis not present

## 2018-11-28 DIAGNOSIS — E038 Other specified hypothyroidism: Secondary | ICD-10-CM | POA: Diagnosis not present

## 2018-11-28 DIAGNOSIS — Z6828 Body mass index (BMI) 28.0-28.9, adult: Secondary | ICD-10-CM | POA: Diagnosis not present

## 2018-11-28 DIAGNOSIS — R740 Nonspecific elevation of levels of transaminase and lactic acid dehydrogenase [LDH]: Secondary | ICD-10-CM | POA: Diagnosis not present

## 2018-11-28 DIAGNOSIS — F5101 Primary insomnia: Secondary | ICD-10-CM | POA: Diagnosis not present

## 2018-11-28 DIAGNOSIS — E782 Mixed hyperlipidemia: Secondary | ICD-10-CM | POA: Diagnosis not present

## 2018-11-28 DIAGNOSIS — R7301 Impaired fasting glucose: Secondary | ICD-10-CM | POA: Diagnosis not present

## 2018-11-28 DIAGNOSIS — J479 Bronchiectasis, uncomplicated: Secondary | ICD-10-CM | POA: Diagnosis not present

## 2018-12-07 DIAGNOSIS — J018 Other acute sinusitis: Secondary | ICD-10-CM | POA: Diagnosis not present

## 2018-12-07 DIAGNOSIS — K5904 Chronic idiopathic constipation: Secondary | ICD-10-CM | POA: Diagnosis not present

## 2019-01-02 DIAGNOSIS — Z79899 Other long term (current) drug therapy: Secondary | ICD-10-CM | POA: Diagnosis not present

## 2019-01-02 DIAGNOSIS — M48062 Spinal stenosis, lumbar region with neurogenic claudication: Secondary | ICD-10-CM | POA: Diagnosis not present

## 2019-01-02 DIAGNOSIS — M5137 Other intervertebral disc degeneration, lumbosacral region: Secondary | ICD-10-CM | POA: Diagnosis not present

## 2019-01-02 DIAGNOSIS — M533 Sacrococcygeal disorders, not elsewhere classified: Secondary | ICD-10-CM | POA: Diagnosis not present

## 2019-01-14 DIAGNOSIS — Z20828 Contact with and (suspected) exposure to other viral communicable diseases: Secondary | ICD-10-CM | POA: Diagnosis not present

## 2019-01-14 DIAGNOSIS — Z1159 Encounter for screening for other viral diseases: Secondary | ICD-10-CM | POA: Diagnosis not present

## 2019-02-03 DIAGNOSIS — Z20828 Contact with and (suspected) exposure to other viral communicable diseases: Secondary | ICD-10-CM | POA: Diagnosis not present

## 2019-02-11 DIAGNOSIS — R51 Headache: Secondary | ICD-10-CM | POA: Diagnosis not present

## 2019-02-11 DIAGNOSIS — Z20828 Contact with and (suspected) exposure to other viral communicable diseases: Secondary | ICD-10-CM | POA: Diagnosis not present

## 2019-02-12 DIAGNOSIS — J158 Pneumonia due to other specified bacteria: Secondary | ICD-10-CM | POA: Diagnosis not present

## 2019-02-12 DIAGNOSIS — Z1159 Encounter for screening for other viral diseases: Secondary | ICD-10-CM | POA: Diagnosis not present

## 2019-03-01 DIAGNOSIS — E038 Other specified hypothyroidism: Secondary | ICD-10-CM | POA: Diagnosis not present

## 2019-03-01 DIAGNOSIS — Z23 Encounter for immunization: Secondary | ICD-10-CM | POA: Diagnosis not present

## 2019-03-01 DIAGNOSIS — F4322 Adjustment disorder with anxiety: Secondary | ICD-10-CM | POA: Diagnosis not present

## 2019-03-01 DIAGNOSIS — F5101 Primary insomnia: Secondary | ICD-10-CM | POA: Diagnosis not present

## 2019-03-01 DIAGNOSIS — I498 Other specified cardiac arrhythmias: Secondary | ICD-10-CM | POA: Diagnosis not present

## 2019-03-01 DIAGNOSIS — R7301 Impaired fasting glucose: Secondary | ICD-10-CM | POA: Diagnosis not present

## 2019-03-01 DIAGNOSIS — Z6829 Body mass index (BMI) 29.0-29.9, adult: Secondary | ICD-10-CM | POA: Diagnosis not present

## 2019-03-01 DIAGNOSIS — R05 Cough: Secondary | ICD-10-CM | POA: Diagnosis not present

## 2019-03-01 DIAGNOSIS — U071 COVID-19: Secondary | ICD-10-CM | POA: Diagnosis not present

## 2019-03-01 DIAGNOSIS — E782 Mixed hyperlipidemia: Secondary | ICD-10-CM | POA: Diagnosis not present

## 2019-03-20 DIAGNOSIS — R079 Chest pain, unspecified: Secondary | ICD-10-CM | POA: Diagnosis not present

## 2019-03-20 DIAGNOSIS — Z8249 Family history of ischemic heart disease and other diseases of the circulatory system: Secondary | ICD-10-CM | POA: Diagnosis not present

## 2019-03-20 DIAGNOSIS — R9431 Abnormal electrocardiogram [ECG] [EKG]: Secondary | ICD-10-CM | POA: Diagnosis not present

## 2019-03-20 DIAGNOSIS — E7800 Pure hypercholesterolemia, unspecified: Secondary | ICD-10-CM | POA: Diagnosis not present

## 2019-03-20 DIAGNOSIS — I499 Cardiac arrhythmia, unspecified: Secondary | ICD-10-CM | POA: Diagnosis not present

## 2019-03-20 DIAGNOSIS — Z79899 Other long term (current) drug therapy: Secondary | ICD-10-CM | POA: Diagnosis not present

## 2019-04-03 DIAGNOSIS — M48062 Spinal stenosis, lumbar region with neurogenic claudication: Secondary | ICD-10-CM | POA: Diagnosis not present

## 2019-04-03 DIAGNOSIS — M4316 Spondylolisthesis, lumbar region: Secondary | ICD-10-CM | POA: Diagnosis not present

## 2019-04-03 DIAGNOSIS — M533 Sacrococcygeal disorders, not elsewhere classified: Secondary | ICD-10-CM | POA: Diagnosis not present

## 2019-04-03 DIAGNOSIS — M5137 Other intervertebral disc degeneration, lumbosacral region: Secondary | ICD-10-CM | POA: Diagnosis not present

## 2019-05-21 DIAGNOSIS — M48062 Spinal stenosis, lumbar region with neurogenic claudication: Secondary | ICD-10-CM | POA: Diagnosis not present

## 2019-05-31 DIAGNOSIS — R7301 Impaired fasting glucose: Secondary | ICD-10-CM | POA: Diagnosis not present

## 2019-05-31 DIAGNOSIS — E038 Other specified hypothyroidism: Secondary | ICD-10-CM | POA: Diagnosis not present

## 2019-05-31 DIAGNOSIS — E782 Mixed hyperlipidemia: Secondary | ICD-10-CM | POA: Diagnosis not present

## 2019-05-31 DIAGNOSIS — I498 Other specified cardiac arrhythmias: Secondary | ICD-10-CM | POA: Diagnosis not present

## 2019-05-31 DIAGNOSIS — F5101 Primary insomnia: Secondary | ICD-10-CM | POA: Diagnosis not present

## 2019-05-31 DIAGNOSIS — Z6829 Body mass index (BMI) 29.0-29.9, adult: Secondary | ICD-10-CM | POA: Diagnosis not present

## 2019-05-31 DIAGNOSIS — F4322 Adjustment disorder with anxiety: Secondary | ICD-10-CM | POA: Diagnosis not present

## 2019-05-31 DIAGNOSIS — L650 Telogen effluvium: Secondary | ICD-10-CM | POA: Diagnosis not present

## 2019-05-31 DIAGNOSIS — N3001 Acute cystitis with hematuria: Secondary | ICD-10-CM | POA: Diagnosis not present

## 2019-06-04 DIAGNOSIS — M48062 Spinal stenosis, lumbar region with neurogenic claudication: Secondary | ICD-10-CM | POA: Diagnosis not present

## 2019-07-02 DIAGNOSIS — M5137 Other intervertebral disc degeneration, lumbosacral region: Secondary | ICD-10-CM | POA: Diagnosis not present

## 2019-07-02 DIAGNOSIS — Z79899 Other long term (current) drug therapy: Secondary | ICD-10-CM | POA: Diagnosis not present

## 2019-07-02 DIAGNOSIS — M533 Sacrococcygeal disorders, not elsewhere classified: Secondary | ICD-10-CM | POA: Diagnosis not present

## 2019-07-02 DIAGNOSIS — M48062 Spinal stenosis, lumbar region with neurogenic claudication: Secondary | ICD-10-CM | POA: Diagnosis not present

## 2019-07-02 DIAGNOSIS — G894 Chronic pain syndrome: Secondary | ICD-10-CM | POA: Diagnosis not present

## 2019-08-14 DIAGNOSIS — Z79899 Other long term (current) drug therapy: Secondary | ICD-10-CM | POA: Diagnosis not present

## 2019-08-19 ENCOUNTER — Other Ambulatory Visit: Payer: Self-pay | Admitting: Family Medicine

## 2019-08-23 ENCOUNTER — Ambulatory Visit (INDEPENDENT_AMBULATORY_CARE_PROVIDER_SITE_OTHER): Payer: Medicare Other | Admitting: Family Medicine

## 2019-08-23 ENCOUNTER — Encounter: Payer: Self-pay | Admitting: Family Medicine

## 2019-08-23 ENCOUNTER — Other Ambulatory Visit: Payer: Self-pay

## 2019-08-23 VITALS — BP 120/64 | HR 69 | Temp 97.3°F | Ht 66.0 in | Wt 163.0 lb

## 2019-08-23 DIAGNOSIS — J470 Bronchiectasis with acute lower respiratory infection: Secondary | ICD-10-CM | POA: Diagnosis not present

## 2019-08-23 DIAGNOSIS — N3000 Acute cystitis without hematuria: Secondary | ICD-10-CM | POA: Diagnosis not present

## 2019-08-23 DIAGNOSIS — J479 Bronchiectasis, uncomplicated: Secondary | ICD-10-CM | POA: Insufficient documentation

## 2019-08-23 DIAGNOSIS — J471 Bronchiectasis with (acute) exacerbation: Secondary | ICD-10-CM | POA: Insufficient documentation

## 2019-08-23 MED ORDER — AMOXICILLIN-POT CLAVULANATE 875-125 MG PO TABS: 1.0000 | ORAL_TABLET | Freq: Two times a day (BID) | ORAL | 0 refills | Status: DC

## 2019-08-23 NOTE — Progress Notes (Signed)
Subjective:  Patient ID: Courtney [name], female    DOB: 17-Sep-1945  Age: 74 y.o. MRN: [id number]  Chief Complaint  Patient presents with  . Urinary Tract Infection    Currently taking Azo. x 7 days abdominal pressure, urinary frequency  . Cough    Green sputum    HPI WF with history of bronchiolitis, who is complaining of cough and shob x 1 week. Productive sputum - yellow.  Denies fever and chills.  Also complaining of bladder symptoms.  She has been taking Azo so her urine is quite orange.  Symptoms have been going on approximately 1-1/2 weeks total.  Denies abdominal pain.   Social History   Socioeconomic History  . Marital status: Married    Spouse name: Not on file  . Number of children: 2  . Years of education: Not on file  . Highest education level: Not on file  Occupational History  . Occupation: retired    Comment: Marine scientist  Tobacco Use  . Smoking status: Never Smoker  . Smokeless tobacco: Never Used  Substance and Sexual Activity  . 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 Drug use: No  . Sexual activity: Not on file    Comment: MARRIED  Other Topics Concern  . Not on file  Social History Narrative  . Not on file   Social Determinants of Health   Financial Resource Strain:   . Difficulty of Paying Living Expenses:   Food Insecurity:   . Worried About Charity fundraiser in the Last Year:   . Arboriculturist in the Last Year:   Transportation Needs:   . Film/video editor (Medical):   [name] Lack of Transportation (Non-Medical):   Physical Activity:   . Days of Exercise per Week:   . Minutes of Exercise per Session:   Stress:   . Feeling of Stress :   Social Connections:   . Frequency of Communication with Friends and Family:   . Frequency of Social Gatherings with Friends and Family:   . Attends Religious Services:   . Active Member of Clubs or Organizations:   . Attends Archivist Meetings:     [name] Marital Status:    Past Medical History:  Diagnosis Date  . Anemia   . Arthritis   . Bronchiectasis ([place])   . Chronic idiopathic constipation   . Depression, major, recurrent, moderate ([place])   . Headache   . Heart murmur   . History of bladder infections   . History of COVID-19   . Hyperlipidemia   . Hypertension   . Hypothyroidism   . Knee pain, bilateral   . Osteoarthritis   . Pneumonia   . Primary insomnia   . Recovering alcoholic ([place])   . SVT (supraventricular tachycardia) ([place])   . UTI (urinary tract infection)   . Varicose veins   . Vitamin B12 deficiency    Family History  Problem Relation Age of Onset  . Heart disease Mother   . Heart disease Father   . Heart disease Brother   . Diabetes Sister   . Heart disease Brother   . Heart disease Brother   . Heart disease Brother   . Heart disease Brother   . Other Sister        BRAIN TUMOR    Review of Systems  Constitutional: Negative for chills, fatigue and fever.  HENT: Negative  for congestion, ear pain and sore throat.   Respiratory: Positive for cough and shortness of breath.   Cardiovascular: Negative for chest pain.  Gastrointestinal: Negative for constipation, nausea and vomiting.  Genitourinary: Positive for frequency. Negative for flank pain and urgency.     Objective:  BP 120/64 (BP Location: Right Arm, Patient Position: Sitting)   Pulse 69   Temp (!) 97.3 F (36.3 C) (Temporal)   Ht 5\' 6"  (1.676 m)   Wt 163 lb (73.9 kg)   SpO2 96%   BMI 26.31 kg/m   BP/Weight 08/23/2019 09/04/2018 8/91/6945  Systolic BP 038 882 -  Diastolic BP 64 81 -  Wt. (Lbs) 163 - 168  BMI 26.31 - 27.53    Physical Exam Vitals reviewed.  Constitutional:      Appearance: Normal appearance. She is normal weight.  HENT:     Right Ear: Tympanic membrane and ear canal normal.     Left Ear: Tympanic membrane and ear canal normal.     Nose: Nose normal.     Mouth/Throat:     Mouth: Mucous membranes are moist.   Cardiovascular:     Rate and Rhythm: Normal rate and regular rhythm.     Pulses: Normal pulses.     Heart sounds: Normal heart sounds.  Pulmonary:     Effort: Pulmonary effort is normal. No respiratory distress.     Breath sounds: Wheezing present.     Comments: RUL. Abdominal:     General: Abdomen is flat. Bowel sounds are normal.     Palpations: Abdomen is soft.     Tenderness: There is no abdominal tenderness.  Neurological:     Mental Status: She is alert and oriented to person, place, and time.  Psychiatric:        Mood and Affect: Mood normal.        Behavior: Behavior normal.     Assessment & Plan:   Bronchiectasis with acute lower respiratory infection ([place]) Augmentin rx sent.  Use proair 2 puffs four times a day  Continue Breo 1 inhalation daily.  Acute cystitis without hematuria Microscopic urinalysis was not conclusive for urinary tract infection.  I am treating her bronchiolitis with Augmentin.  I will send a urine culture just in case.  Orders Placed This Encounter  Procedures  . Urine Culture  . POCT UA - Microscopic Only         Follow-up: Return in about 4 weeks (around 09/20/2019).  AVS was given to patient prior to departure.  [name] [facility] [phone]

## 2019-08-24 LAB — URINE CULTURE: Organism ID, Bacteria: NO GROWTH

## 2019-08-26 ENCOUNTER — Other Ambulatory Visit: Payer: Self-pay

## 2019-08-26 MED ORDER — FUROSEMIDE 20 MG PO TABS: 20.0000 mg | ORAL_TABLET | Freq: Every day | ORAL | 0 refills | Status: DC

## 2019-08-27 DIAGNOSIS — Z961 Presence of intraocular lens: Secondary | ICD-10-CM | POA: Diagnosis not present

## 2019-08-27 DIAGNOSIS — H43393 Other vitreous opacities, bilateral: Secondary | ICD-10-CM | POA: Diagnosis not present

## 2019-08-27 DIAGNOSIS — H524 Presbyopia: Secondary | ICD-10-CM | POA: Diagnosis not present

## 2019-08-27 DIAGNOSIS — D3131 Benign neoplasm of right choroid: Secondary | ICD-10-CM | POA: Diagnosis not present

## 2019-08-27 DIAGNOSIS — H18413 Arcus senilis, bilateral: Secondary | ICD-10-CM | POA: Diagnosis not present

## 2019-09-01 ENCOUNTER — Encounter: Payer: Self-pay | Admitting: Family Medicine

## 2019-09-01 LAB — POCT UA - MICROSCOPIC ONLY
Bacteria, U Microscopic: 0
Epithelial cells, urine per micros: 0
RBC, urine, microscopic: 0

## 2019-09-01 NOTE — Assessment & Plan Note (Signed)
Augmentin rx sent.  Use proair 2 puffs four times a day  Continue Breo 1 inhalation daily.

## 2019-09-01 NOTE — Patient Instructions (Signed)
.  Bronchiectasis with acute lower respiratory infection ([place]) Augmentin rx sent.  Use proair 2 puffs four times a day  Continue Breo 1 inhalation daily.  Acute cystitis without hematuria Microscopic urinalysis was not conclusive for urinary tract infection.  I am treating her bronchiolitis with Augmentin.  I will send a urine culture just in case.

## 2019-09-01 NOTE — Assessment & Plan Note (Signed)
Microscopic urinalysis was not conclusive for urinary tract infection.  I am treating her bronchiolitis with Augmentin.  I will send a urine culture just in case.

## 2019-09-02 ENCOUNTER — Ambulatory Visit: Payer: Medicare Other | Admitting: Family Medicine

## 2019-09-03 ENCOUNTER — Other Ambulatory Visit: Payer: Self-pay | Admitting: Family Medicine

## 2019-09-11 ENCOUNTER — Ambulatory Visit (INDEPENDENT_AMBULATORY_CARE_PROVIDER_SITE_OTHER): Payer: Medicare Other | Admitting: Legal Medicine

## 2019-09-11 ENCOUNTER — Other Ambulatory Visit: Payer: Self-pay

## 2019-09-11 ENCOUNTER — Encounter: Payer: Self-pay | Admitting: Legal Medicine

## 2019-09-11 DIAGNOSIS — I739 Peripheral vascular disease, unspecified: Secondary | ICD-10-CM

## 2019-09-11 DIAGNOSIS — I959 Hypotension, unspecified: Secondary | ICD-10-CM | POA: Insufficient documentation

## 2019-09-11 MED ORDER — CLOPIDOGREL BISULFATE 75 MG PO TABS: 75.0000 mg | ORAL_TABLET | Freq: Every day | ORAL | 2 refills | Status: DC

## 2019-09-11 NOTE — Progress Notes (Signed)
Acute Office Visit  Subjective:    Patient ID: Courtney [name], female    DOB: 1945/07/31, 74 y.o.   MRN: [id number]  Chief Complaint  Patient presents with  . Abdominal Pain  . Leg Pain    HPI Patient is in today for leg pain.  She is having some abdominal pain. Needs GYN.  She did not get her ABIs.  Hurts all the time.  Using gabapentin- no help, tried hydrocodone little help, 12 ibuprofen a day. She has vascular disease.  Past Medical History:  Diagnosis Date  . Anemia   . Arthritis   . Bronchiectasis ([place])   . Chronic idiopathic constipation   . Depression, major, recurrent, moderate ([place])   . Headache   . Heart murmur   . History of bladder infections   . History of COVID-19   . Hyperlipidemia   . Hypertension   . Hypothyroidism   . Knee pain, bilateral   . Osteoarthritis   . Pneumonia   . Primary insomnia   . Recovering alcoholic ([place])   . SVT (supraventricular tachycardia) ([place])   . UTI (urinary tract infection)   . Varicose veins   . Vitamin B12 deficiency     Past Surgical History:  Procedure Laterality Date  . ABDOMINAL HYSTERECTOMY  1991  . ANGIOPLASTY     at least 15 years ago, pt. denies   . APPENDECTOMY    . AUGMENTATION MAMMAPLASTY Bilateral   . BACK SURGERY     had 2 surgeries. Have rods placed in back   . BARIATRIC SURGERY     lap band-at least 14 years ago  . BREAST BIOPSY Left    x2  . BREAST ENHANCEMENT SURGERY  2006  . COLONOSCOPY  12/01/2016   Moderate predominantly sigmoid diverticulosis.   [name] EYE SURGERY     IOL- bilateral - [place]  . KNEE ARTHROSCOPY Left   . lap band surgery  1981  . LUMBAR FUSION  07/29/2015   posterior level one  . removal of cervical disc fragments  10/2007   pt. denies     Family History  Problem Relation Age of Onset  . Heart disease Mother   . Heart disease Father   . Heart disease Brother   . Diabetes Sister   . Heart disease Brother   . Heart disease Brother   . Heart disease Brother   .  Heart disease Brother   . Other Sister        BRAIN TUMOR    Social History   Socioeconomic History  . Marital status: Married    Spouse name: Not on file  . Number of children: 2  . Years of education: Not on file  . Highest education level: Not on file  Occupational History  . Occupation: retired    Comment: Marine scientist  Tobacco Use  . Smoking status: Never Smoker  . Smokeless tobacco: Never Used  Substance and Sexual Activity  . 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 Drug use: No  . Sexual activity: Not on file    Comment: MARRIED  Other Topics Concern  . Not on file  Social History Narrative  . Not on file   Social Determinants of Health   Financial Resource Strain:   . Difficulty of Paying Living Expenses:   Food Insecurity:   . Worried About Charity fundraiser in the Last Year:   .  Ran Out of Food in the Last Year:   Transportation Needs:   . Film/video editor (Medical):   [name] Lack of Transportation (Non-Medical):   Physical Activity:   . Days of Exercise per Week:   . Minutes of Exercise per Session:   Stress:   . Feeling of Stress :   Social Connections:   . Frequency of Communication with Friends and Family:   . Frequency of Social Gatherings with Friends and Family:   . Attends Religious Services:   . Active Member of Clubs or Organizations:   . Attends Archivist Meetings:   [name] Marital Status:   Intimate Partner Violence:   . Fear of Current or Ex-Partner:   . Emotionally Abused:   [name] Physically Abused:   . Sexually Abused:     Outpatient Medications Prior to Visit  Medication Sig Dispense Refill  . aspirin EC 81 MG tablet Take 81 mg by mouth daily.    . cyclobenzaprine (FLEXERIL) 10 MG tablet Take 1 tablet (10 mg total) by mouth 3 (three) times daily as needed for muscle spasms. 50 tablet 1  . docusate sodium (COLACE) 100 MG capsule Take 1 capsule (100 mg total) by mouth 2 (two) times  daily. 60 capsule 0  . fluticasone (FLONASE) 50 MCG/ACT nasal spray Place 2 sprays into both nostrils daily as needed for allergies.     . furosemide (LASIX) 20 MG tablet TAKE 1 TABLET BY MOUTH DAILY 90 tablet 0  . ibuprofen (ADVIL) 200 MG tablet Take by mouth.    . levothyroxine (SYNTHROID) 50 MCG tablet TAKE 1 TABLET BY MOUTH EVERY DAY 90 tablet 0  . metoprolol tartrate (LOPRESSOR) 25 MG tablet Take 50 mg by mouth 2 (two) times daily.     . nitroGLYCERIN (NITROSTAT) 0.4 MG SL tablet Place 0.4 mg under the tongue every 5 (five) minutes as needed for chest pain.   0  . oxyCODONE 10 MG TABS Take 1 tablet (10 mg total) by mouth every 4 (four) hours as needed for severe pain ((score 7 to 10)). 30 tablet 0  . potassium chloride SA (K-DUR,KLOR-CON) 20 MEQ tablet Take 20 mEq by mouth daily.    [name] REPATHA SURECLICK 465 MG/ML SOAJ Inject 140 mg into the skin every 14 (fourteen) days.    [name] Respiratory Therapy Supplies (FLUTTER) DEVI Use as directed. 1 each 0  . tretinoin (RETIN-A) 0.05 % cream tretinoin 0.05 % topical cream    . zolpidem (AMBIEN) 5 MG tablet     . albuterol (PROAIR HFA) 108 (90 Base) MCG/ACT inhaler Inhale 2 puffs into the lungs every 6 (six) hours as needed for wheezing or shortness of breath. 1 Inhaler 3  . amoxicillin-clavulanate (AUGMENTIN) 875-125 MG tablet Take 1 tablet by mouth 2 (two) times daily. 20 tablet 0  . diazepam (VALIUM) 5 MG tablet diazepam 5 mg tablet  TK 1 TO 2 TS PO 1 HOUR PRIOR TO SCHEDULED MRI    . LORazepam (ATIVAN) 0.5 MG tablet lorazepam 0.5 mg tablet  TK 1 T PO QD PRF SEVERE ANXIETY     No facility-administered medications prior to visit.    Allergies  Allergen Reactions  . Levofloxacin Hives, Shortness Of Breath and Swelling    RASH IN MOUTH   (can take IV route)  . Statins Other (See Comments)    Myalgias  . Gabapentin Nausea Only    Review of Systems  Constitutional: Negative.   HENT: Negative.   Eyes: Negative.  Respiratory: Negative.     Cardiovascular: Negative.   Gastrointestinal: Negative.   Endocrine: Negative.   Musculoskeletal: Negative.   Neurological: Negative.        Objective:    Physical Exam Constitutional:      Appearance: She is well-developed.  HENT:     Head: Normocephalic and atraumatic.  Eyes:     Extraocular Movements: Extraocular movements intact.     Pupils: Pupils are equal, round, and reactive to light.  Cardiovascular:     Rate and Rhythm: Normal rate and regular rhythm.     Pulses:          Carotid pulses are 2+ on the right side and 2+ on the left side.      Radial pulses are 2+ on the right side and 2+ on the left side.       Femoral pulses are 2+ on the right side and 2+ on the left side.      Popliteal pulses are 1+ on the right side and 1+ on the left side.       Dorsalis pedis pulses are 0 on the right side and 0 on the left side.       Posterior tibial pulses are 0 on the right side and 0 on the left side.     Heart sounds: Normal heart sounds.  Pulmonary:     Effort: Pulmonary effort is normal.     Breath sounds: Normal breath sounds.  Abdominal:     General: Bowel sounds are normal.     Palpations: Abdomen is rigid.  Musculoskeletal:     Right lower leg: No edema.     Left lower leg: No edema.  Neurological:     Mental Status: She is alert.     BP 128/80   Pulse 81   Temp 97.8 F (36.6 C)   Resp 16   Ht 5\' 5"  (1.651 m)   Wt 170 lb 9.6 oz (77.4 kg)   SpO2 95%   BMI 28.39 kg/m  Wt Readings from Last 3 Encounters:  09/11/19 170 lb 9.6 oz (77.4 kg)  08/23/19 163 lb (73.9 kg)  09/03/18 168 lb (76.2 kg)    Health Maintenance Due  Topic Date Due  . TETANUS/TDAP  Never done  . COLONOSCOPY  Never done  . DEXA SCAN  Never done  . PNA vac Low Risk Adult (1 of 2 - PCV13) Never done    There are no preventive care reminders to display for this patient.    Lab Results  Component Value Date   WBC 13.4 (H) 09/04/2018   HGB 10.9 (L) 09/04/2018   HCT 34.4 (L)  09/04/2018   MCV 93.2 09/04/2018   PLT 230 09/04/2018   Lab Results  Component Value Date   NA 132 (L) 09/04/2018   K 5.0 09/04/2018   CO2 24 09/04/2018   GLUCOSE 141 (H) 09/04/2018   BUN 16 09/04/2018   CREATININE 1.02 (H) 09/04/2018   BILITOT 0.8 01/30/2018   ALKPHOS 104 01/30/2018   AST 194 (H) 01/30/2018   ALT 238 (H) 01/30/2018   PROT 7.9 01/30/2018   ALBUMIN 4.1 01/30/2018   CALCIUM 8.8 (L) 09/04/2018   ANIONGAP 9 09/04/2018   Lab Results  Component Value Date   CHOL (H) 09/20/2009    213        ATP III CLASSIFICATION:  <200     mg/dL   Desirable  200-239  mg/dL   Borderline High  >=  240    mg/dL   High          Lab Results  Component Value Date   HDL 51 09/20/2009   Lab Results  Component Value Date   LDLCALC (H) 09/20/2009    146        Total Cholesterol/HDL:CHD Risk Coronary Heart Disease Risk Table                     Men   Women  1/2 Average Risk   3.4   3.3  Average Risk       5.0   4.4  2 X Average Risk   9.6   7.1  3 X Average Risk  23.4   11.0        Use the calculated Patient Ratio above and the CHD Risk Table to determine the patient's CHD Risk.        ATP III CLASSIFICATION (LDL):  <100     mg/dL   Optimal  100-129  mg/dL   Near or Above                    Optimal  130-159  mg/dL   Borderline  160-189  mg/dL   High  >190     mg/dL   Very High   Lab Results  Component Value Date   TRIG 81 09/20/2009   Lab Results  Component Value Date   CHOLHDL 4.2 09/20/2009   No results found for: HGBA1C     Assessment & Plan:   Problem List Items Addressed This Visit      Cardiovascular and Mediastinum   Peripheral vascular disease ([place])    Patient has severe claudication active and at rest.  Oxycodone no help.  We will try plavix we discussed anticoagulation.  Patent is having vascular ischemia and needs vascular workup soon. No gangrene ,      Relevant Medications   clopidogrel (PLAVIX) 75 MG tablet   Other Relevant Orders    Ambulatory referral to Vascular Surgery       Meds ordered this encounter  Medications  . clopidogrel (PLAVIX) 75 MG tablet    Sig: Take 1 tablet (75 mg total) by mouth daily.    Dispense:  90 tablet    Refill:  2     [name], MD

## 2019-09-11 NOTE — Assessment & Plan Note (Signed)
Patient has severe claudication active and at rest.  Oxycodone no help.  We will try plavix we discussed anticoagulation.  Patent is having vascular ischemia and needs vascular workup soon. No gangrene ,

## 2019-09-13 ENCOUNTER — Encounter: Payer: Self-pay | Admitting: Pulmonary Disease

## 2019-09-13 ENCOUNTER — Other Ambulatory Visit: Payer: Self-pay

## 2019-09-13 ENCOUNTER — Ambulatory Visit (INDEPENDENT_AMBULATORY_CARE_PROVIDER_SITE_OTHER): Payer: Medicare Other | Admitting: Pulmonary Disease

## 2019-09-13 VITALS — BP 110/65 | HR 73 | Temp 98.1°F | Ht 65.5 in | Wt 169.0 lb

## 2019-09-13 DIAGNOSIS — R0602 Shortness of breath: Secondary | ICD-10-CM | POA: Diagnosis not present

## 2019-09-13 DIAGNOSIS — J479 Bronchiectasis, uncomplicated: Secondary | ICD-10-CM

## 2019-09-13 NOTE — Progress Notes (Signed)
Courtney [name]    [id number]    [birth year]  Primary Care Physician:[name], Elnita Maxwell, MD  Referring Physician: [name], MD [address] [place],  [place] [address]  Chief complaint: Follow-up for recurrent pneumonia, bronchiectasis  HPI: 74 year old with history of SVT, hyperlipidemia, allergies, GERD She has episodes of recurrent bronchiectasis, pneumonia.  Treated with antibiotics over the past few months.  Continues to have cough with congestion, wheezing, dyspnea.  Also has significant symptoms of postnasal drip, rhinitis. Sent to pulmonary clinic for evaluation of recurrent bronchitis, suspected bronchiectasis.  Pets: No pets Occupation: Retired Marine scientist Exposures: No known exposures, no mold, hot tub, Jacuzzi.  Has a down comforter. Smoking history: Never smoker Travel history: Likes to travel around the country in an RV Relevant family history: Brother and sister have asthma.  Interim history: Since last visit she has had recurrent attacks of bronchitis/bronchiectasis exacerbation treated with antibiotics by primary care.  Outpatient Encounter Medications as of 09/13/2019  Medication Sig  . aspirin EC 81 MG tablet Take 81 mg by mouth daily.  . clopidogrel (PLAVIX) 75 MG tablet Take 1 tablet (75 mg total) by mouth daily.  . cyclobenzaprine (FLEXERIL) 10 MG tablet Take 1 tablet (10 mg total) by mouth 3 (three) times daily as needed for muscle spasms.  [name] docusate sodium (COLACE) 100 MG capsule Take 1 capsule (100 mg total) by mouth 2 (two) times daily.  . fluticasone (FLONASE) 50 MCG/ACT nasal spray Place 2 sprays into both nostrils daily as needed for allergies.   . furosemide (LASIX) 20 MG tablet TAKE 1 TABLET BY MOUTH DAILY  . ibuprofen (ADVIL) 200 MG tablet Take by mouth.  . levothyroxine (SYNTHROID) 50 MCG tablet TAKE 1 TABLET BY MOUTH EVERY DAY  . metoprolol tartrate (LOPRESSOR) 25 MG tablet Take 50 mg by mouth 2 (two) times daily.   . nitroGLYCERIN  (NITROSTAT) 0.4 MG SL tablet Place 0.4 mg under the tongue every 5 (five) minutes as needed for chest pain.   [name] oxyCODONE 10 MG TABS Take 1 tablet (10 mg total) by mouth every 4 (four) hours as needed for severe pain ((score 7 to 10)).  [name] potassium chloride SA (K-DUR,KLOR-CON) 20 MEQ tablet Take 20 mEq by mouth daily.  [name] REPATHA SURECLICK 621 MG/ML SOAJ Inject 140 mg into the skin every 14 (fourteen) days.  [name] Respiratory Therapy Supplies (FLUTTER) DEVI Use as directed.  . tretinoin (RETIN-A) 0.05 % cream tretinoin 0.05 % topical cream  . zolpidem (AMBIEN) 5 MG tablet    No facility-administered encounter medications on file as of 09/13/2019.   Physical Exam: Blood pressure 110/65, pulse 73, temperature 98.1 F (36.7 C), temperature source Temporal, height 5' 5.5" (1.664 m), weight 169 lb (76.7 kg), SpO2 96 %. Gen:      No acute distress HEENT:  EOMI, sclera anicteric Neck:     No masses; no thyromegaly Lungs:    Clear to auscultation bilaterally; normal respiratory effort CV:         Regular rate and rhythm; no murmurs Abd:      + bowel sounds; soft, non-tender; no palpable masses, no distension Ext:    No edema; adequate peripheral perfusion Skin:      Warm and dry; no rash Neuro: alert and oriented x 3 Psych: normal mood and affect  Data Reviewed: Imaging: CT angio 01/30/18- patchy multifocal groundglass opacities, nodular densities bilaterally left greater than  right.  Patulous esophagus with mild thickening.  I have reviewed the images personally.  High-resolution CT 04/02/2018- mid and lower lung groundglass opacities, mild bronchiectasis and nodularity.  Aortic atherosclerosis, coronary artery calcification I reviewed the images personally.  Imaging from primary care Chest x-ray March 07, 2018, new infiltrate in the lingula superimposed on chronic scarring.  PFTs: 03/21/2018 FVC 2.53 [90%), FEV1 2.29 [99%), F/F 90, TLC 85%, DLCO 87% Normal test  FENO  03/20/2018-24  Labs: CBC from primary care 03/05/2018-WBC 7, granulocytes 67%, lymphocytes 26%. CBC 03/20/1989-WBC 8, eos 4.7%, absolute eosinophil count 376  Bronchiectasis work up 04/10/2018 Sputum culture -negative for mycobacteria, regular cultures, fungus cultures IgG, IgA, IgM-normal Alpha-1 antitrypsin-101, PIMZ IgE- 98 ANA, rheumatoid factor-negative  Assessment:  Recurrent bronchitis, bronchiectasis Images reviewed with CT scan showing patchy nodular opacities and groundglass, mild bronchiectasis Work-up as noted above was negative.  No evidence of MAI in sputum  She does have down pillows and will need work-up for hypersensitivity pneumonitis Check HP panel, repeat HRCT Continue percussion vest, Mucinex Start saline nebs for mucociliary clearance  Based on repeat high-res CT we may need a bronchoscopy for further evaluation.  GERD, ? aspiration History noted for gastric lap band, GERD and esophagitis on recent CT scan.  She may have esophageal dysfunction and recurrent aspiration.   Follows with Dr. Lyndel Safe, GI.  She is planning to follow up with her surgeon to get her gastric lap band removed Consider barium swallow if she continues to have symptoms  Cough, postnasal drip Continue chlorphentermine, Flonase nasal spray  Plan/Recommendations: - Saline nebs - Muco ciliary clearance with percussion vest, Mucinex - Hypersensitivity panel serology - High-res CT.  [name] MD [facility] 09/13/2019, 11:16 AM  CC: [name], MD

## 2019-09-13 NOTE — Addendum Note (Signed)
Addended by: Suzzanne Cloud E on: 09/13/2019 11:45 AM   Modules accepted: Orders

## 2019-09-13 NOTE — Patient Instructions (Signed)
We will check sputum for AFB, fungus and regular cultures We will schedule you for high-resolution CT Follow-up in 2 to 4 weeks for reevaluation Based on these results we may consider a procedure called bronchoscope.

## 2019-09-18 ENCOUNTER — Other Ambulatory Visit: Payer: Medicare Other

## 2019-09-18 DIAGNOSIS — Z7902 Long term (current) use of antithrombotics/antiplatelets: Secondary | ICD-10-CM | POA: Diagnosis not present

## 2019-09-18 DIAGNOSIS — M79604 Pain in right leg: Secondary | ICD-10-CM | POA: Diagnosis not present

## 2019-09-18 DIAGNOSIS — M5136 Other intervertebral disc degeneration, lumbar region: Secondary | ICD-10-CM | POA: Diagnosis not present

## 2019-09-18 DIAGNOSIS — E7800 Pure hypercholesterolemia, unspecified: Secondary | ICD-10-CM | POA: Diagnosis not present

## 2019-09-18 DIAGNOSIS — Z7951 Long term (current) use of inhaled steroids: Secondary | ICD-10-CM | POA: Diagnosis not present

## 2019-09-18 DIAGNOSIS — R0602 Shortness of breath: Secondary | ICD-10-CM | POA: Diagnosis not present

## 2019-09-18 DIAGNOSIS — I739 Peripheral vascular disease, unspecified: Secondary | ICD-10-CM | POA: Diagnosis not present

## 2019-09-18 DIAGNOSIS — M79605 Pain in left leg: Secondary | ICD-10-CM | POA: Diagnosis not present

## 2019-09-18 DIAGNOSIS — J479 Bronchiectasis, uncomplicated: Secondary | ICD-10-CM | POA: Diagnosis not present

## 2019-09-18 DIAGNOSIS — I209 Angina pectoris, unspecified: Secondary | ICD-10-CM | POA: Diagnosis not present

## 2019-09-18 DIAGNOSIS — Z8249 Family history of ischemic heart disease and other diseases of the circulatory system: Secondary | ICD-10-CM | POA: Diagnosis not present

## 2019-09-18 DIAGNOSIS — Z8616 Personal history of COVID-19: Secondary | ICD-10-CM | POA: Diagnosis not present

## 2019-09-18 DIAGNOSIS — Z7982 Long term (current) use of aspirin: Secondary | ICD-10-CM | POA: Diagnosis not present

## 2019-09-19 ENCOUNTER — Other Ambulatory Visit: Payer: Self-pay | Admitting: Legal Medicine

## 2019-09-19 ENCOUNTER — Telehealth: Payer: Self-pay

## 2019-09-19 ENCOUNTER — Other Ambulatory Visit: Payer: Self-pay

## 2019-09-19 MED ORDER — OXYCODONE-ACETAMINOPHEN 10-325 MG PO TABS: 1.0000 | ORAL_TABLET | Freq: Three times a day (TID) | ORAL | 0 refills | Status: DC | PRN

## 2019-09-19 NOTE — Telephone Encounter (Signed)
Patient was referral to vascular and they have open until next month 10/22/2019. She is in a lot of pain and the Hydrocodone is not helping her. She asked if can you send something stronger to the pharmacy for the pain.

## 2019-09-19 NOTE — Telephone Encounter (Signed)
See if [place] [facility] will see her sooner lp

## 2019-09-20 ENCOUNTER — Other Ambulatory Visit: Payer: Self-pay

## 2019-09-20 LAB — HYPERSENSITIVITY PNEUMONITIS
A. Pullulans Abs: NEGATIVE
A.Fumigatus #1 Abs: NEGATIVE
Micropolyspora faeni, IgG: NEGATIVE
Pigeon Serum Abs: NEGATIVE
Thermoact. Saccharii: NEGATIVE
Thermoactinomyces vulgaris, IgG: NEGATIVE

## 2019-09-20 MED ORDER — OXYCODONE-ACETAMINOPHEN 10-325 MG PO TABS: 1.0000 | ORAL_TABLET | Freq: Three times a day (TID) | ORAL | 0 refills | Status: AC | PRN

## 2019-09-25 ENCOUNTER — Other Ambulatory Visit: Payer: Self-pay

## 2019-09-25 ENCOUNTER — Ambulatory Visit (HOSPITAL_COMMUNITY)
Admission: RE | Admit: 2019-09-25 | Discharge: 2019-09-25 | Disposition: A | Discharge status: Home / Self Care | Payer: Medicare Other | Source: Ambulatory Visit | Attending: Pulmonary Disease | Admitting: Pulmonary Disease

## 2019-09-25 DIAGNOSIS — R0602 Shortness of breath: Secondary | ICD-10-CM | POA: Diagnosis not present

## 2019-09-25 DIAGNOSIS — J479 Bronchiectasis, uncomplicated: Secondary | ICD-10-CM | POA: Diagnosis not present

## 2019-09-25 DIAGNOSIS — M5137 Other intervertebral disc degeneration, lumbosacral region: Secondary | ICD-10-CM | POA: Diagnosis not present

## 2019-09-25 DIAGNOSIS — R918 Other nonspecific abnormal finding of lung field: Secondary | ICD-10-CM | POA: Diagnosis not present

## 2019-09-25 DIAGNOSIS — G894 Chronic pain syndrome: Secondary | ICD-10-CM | POA: Diagnosis not present

## 2019-09-25 DIAGNOSIS — M48062 Spinal stenosis, lumbar region with neurogenic claudication: Secondary | ICD-10-CM | POA: Diagnosis not present

## 2019-09-25 DIAGNOSIS — M533 Sacrococcygeal disorders, not elsewhere classified: Secondary | ICD-10-CM | POA: Diagnosis not present

## 2019-10-01 ENCOUNTER — Ambulatory Visit: Payer: Medicare Other | Admitting: Family Medicine

## 2019-10-01 NOTE — Progress Notes (Signed)
Established Patient Office Visit  Subjective:  Patient ID: Courtney [name], female    DOB: Nov 04, 1945  Age: 74 y.o. MRN: [id number]  CC:  Chief Complaint  Patient presents with  . Hyperlipidemia  . Depression  . Hypothyroidism    HPI Patient presents with other specified hypothyroidism.  She is currently taking Synthroid, 50 mcg daily.  She denies any related symptoms.      Pt presents with hyperlipidemia.  Current treatment includes Repatha. intolerant to statins. and a low cholesterol/low fat diet.  Compliance with treatment has been good; she maintains her low cholesterol diet, follows up as directed, and not exercising due to pain.      [name] presents with a diagnosis of impaired fasting glucose.  The course has been stable and nonprogressive.  Eating healthy.    [name] presents with a diagnosis of SVT.  The course has been stable and nonprogressive.  metoprolol 50 mg twice a day.  Patient has bronchiectasis. Taken off inhalers. Sees pulmonology.   Complaining of bilateral leg pain at rest and with ambulation. Patient saw cardiology and both her cardiologist and Dr. Henrene Pastor said her pulses in legs. .Dr .Henrene Pastor started her on plavix until she could get ABI.  Past Medical History:  Diagnosis Date  . Anemia   . Arthritis   . Bronchiectasis ([place])   . Chronic idiopathic constipation   . Depression, major, recurrent, moderate ([place])   . Headache   . Heart murmur   . History of bladder infections   . History of COVID-19   . Hyperlipidemia   . Hypertension   . Hypothyroidism   . Knee pain, bilateral   . Osteoarthritis   . Pneumonia   . Primary insomnia   . Recovering alcoholic ([place])   . SVT (supraventricular tachycardia) ([place])   . UTI (urinary tract infection)   . Varicose veins   . Vitamin B12 deficiency     Past Surgical History:  Procedure Laterality Date  . ABDOMINAL HYSTERECTOMY  1991  . ANGIOPLASTY     at least 15 years ago, pt. denies   . APPENDECTOMY    .  AUGMENTATION MAMMAPLASTY Bilateral   . BACK SURGERY     had 2 surgeries. Have rods placed in back   . BARIATRIC SURGERY     lap band-at least 14 years ago  . BREAST BIOPSY Left    x2  . BREAST ENHANCEMENT SURGERY  2006  . COLONOSCOPY  12/01/2016   Moderate predominantly sigmoid diverticulosis.   [name] EYE SURGERY     IOL- bilateral - [place]  . KNEE ARTHROSCOPY Left   . lap band surgery  1981  . LUMBAR FUSION  07/29/2015   posterior level one  . removal of cervical disc fragments  10/2007   pt. denies     Family History  Problem Relation Age of Onset  . Heart disease Mother   . Heart disease Father   . Heart disease Brother   . Diabetes Sister   . Heart disease Brother   . Heart disease Brother   . Heart disease Brother   . Heart disease Brother   . Other Sister        BRAIN TUMOR    Social History   Socioeconomic History  . Marital status: Married    Spouse name: Not on file  . Number of children: 2  . Years of education: Not on file  . Highest education level: Not on file  Occupational History  .  Occupation: retired    Comment: Marine scientist  Tobacco Use  . Smoking status: Never Smoker  . Smokeless tobacco: Never Used  Substance and Sexual Activity  . 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 Drug use: No  . Sexual activity: Not on file    Comment: MARRIED  Other Topics Concern  . Not on file  Social History Narrative  . Not on file   Social Determinants of Health   Financial Resource Strain:   . Difficulty of Paying Living Expenses:   Food Insecurity:   . Worried About Charity fundraiser in the Last Year:   . Arboriculturist in the Last Year:   Transportation Needs:   . Film/video editor (Medical):   [name] Lack of Transportation (Non-Medical):   Physical Activity:   . Days of Exercise per Week:   . Minutes of Exercise per Session:   Stress:   . Feeling of Stress :   Social Connections:   . Frequency of  Communication with Friends and Family:   . Frequency of Social Gatherings with Friends and Family:   . Attends Religious Services:   . Active Member of Clubs or Organizations:   . Attends Archivist Meetings:   [name] Marital Status:   Intimate Partner Violence:   . Fear of Current or Ex-Partner:   . Emotionally Abused:   [name] Physically Abused:   . Sexually Abused:     Outpatient Medications Prior to Visit  Medication Sig Dispense Refill  . aspirin EC 81 MG tablet Take 81 mg by mouth daily.    . clopidogrel (PLAVIX) 75 MG tablet Take 1 tablet (75 mg total) by mouth daily. 90 tablet 2  . cyclobenzaprine (FLEXERIL) 10 MG tablet Take 1 tablet (10 mg total) by mouth 3 (three) times daily as needed for muscle spasms. 50 tablet 1  . docusate sodium (COLACE) 100 MG capsule Take 1 capsule (100 mg total) by mouth 2 (two) times daily. 60 capsule 0  . fluticasone (FLONASE) 50 MCG/ACT nasal spray Place 2 sprays into both nostrils daily as needed for allergies.     . furosemide (LASIX) 20 MG tablet TAKE 1 TABLET BY MOUTH DAILY 90 tablet 0  . HYDROcodone-acetaminophen (NORCO) 10-325 MG tablet     . ibuprofen (ADVIL) 200 MG tablet Take by mouth.    . levothyroxine (SYNTHROID) 50 MCG tablet TAKE 1 TABLET BY MOUTH EVERY DAY 90 tablet 0  . metoprolol tartrate (LOPRESSOR) 25 MG tablet Take 50 mg by mouth 2 (two) times daily.     . nitroGLYCERIN (NITROSTAT) 0.4 MG SL tablet Place 0.4 mg under the tongue every 5 (five) minutes as needed for chest pain.   0  . oxyCODONE 10 MG TABS Take 1 tablet (10 mg total) by mouth every 4 (four) hours as needed for severe pain ((score 7 to 10)). 30 tablet 0  . potassium chloride SA (K-DUR,KLOR-CON) 20 MEQ tablet Take 20 mEq by mouth daily.    [name] REPATHA SURECLICK 962 MG/ML SOAJ Inject 140 mg into the skin every 14 (fourteen) days.    [name] Respiratory Therapy Supplies (FLUTTER) DEVI Use as directed. 1 each 0  . tiZANidine (ZANAFLEX) 4 MG tablet     . tretinoin (RETIN-A)  0.05 % cream tretinoin 0.05 % topical cream    . zolpidem (AMBIEN) 5 MG tablet      No facility-administered medications prior  to visit.    Allergies  Allergen Reactions  . Levofloxacin Hives, Shortness Of Breath and Swelling    RASH IN MOUTH   (can take IV route)  . Statins Other (See Comments)    Myalgias  . Gabapentin Nausea Only    ROS Review of Systems  Constitutional: Positive for fatigue. Negative for chills, diaphoresis and fever.       Tired after having Covid 19 in September 2020, but has intensified since had covid vaccines (x 2.) Last covid vaccine was 2 months ago.   HENT: Positive for rhinorrhea. Negative for congestion, ear pain and sore throat.   Respiratory: Positive for shortness of breath. Negative for cough.        Due to bronchiectasis. She only feels good when she is on an antibiotic.   Cardiovascular: Negative for chest pain.  Gastrointestinal: Positive for constipation. Negative for abdominal pain, diarrhea, nausea and vomiting.       BMs every 3 days. Uses a stool softener daily. Tried Linzess (helped, too expensive.) and miralax helps when takes it prn.   Genitourinary: Negative for dysuria and urgency.  Musculoskeletal: Positive for arthralgias and myalgias. Negative for back pain.       Hip pain BL, knee pain BL. Shoulder pain BL. Muscle pain - aches all over.   Neurological: Negative for dizziness, weakness, light-headedness and headaches.  Psychiatric/Behavioral: Negative for dysphoric mood. The patient is not nervous/anxious.       Objective:    Physical Exam  Constitutional: She is oriented to person, place, and time. She appears well-developed and well-nourished.  HENT:  Right Ear: External ear normal.  Left Ear: External ear normal.  Nose: Mucosal edema and rhinorrhea present. Right sinus exhibits no maxillary sinus tenderness and no frontal sinus tenderness. Left sinus exhibits no maxillary sinus tenderness and no frontal sinus tenderness.    Mouth/Throat: Oropharynx is clear and moist. No oropharyngeal exudate.  Cardiovascular: Normal rate, regular rhythm and normal heart sounds.  Pulmonary/Chest: Effort normal and breath sounds normal.  Abdominal: Soft. Bowel sounds are normal. There is no abdominal tenderness.  Musculoskeletal:        General: Tenderness present.     Comments: No FM trigger points. FROM of BL Lower extremities.  Neurological: She is alert and oriented to person, place, and time.  Skin: Skin is warm and dry.  Psychiatric: She has a normal mood and affect. Her behavior is normal.    BP 122/66 (BP Location: Left Arm, Patient Position: Sitting)   Pulse 62   Temp (!) 97.4 F (36.3 C) (Temporal)   Ht 5' 5.5" (1.664 m)   Wt 171 lb (77.6 kg)   SpO2 93%   BMI 28.02 kg/m  Wt Readings from Last 3 Encounters:  10/02/19 171 lb (77.6 kg)  09/13/19 169 lb (76.7 kg)  09/11/19 170 lb 9.6 oz (77.4 kg)     Health Maintenance Due  Topic Date Due  . TETANUS/TDAP  Never done  . COLONOSCOPY  Never done  . DEXA SCAN  Never done  . PNA vac Low Risk Adult (1 of 2 - PCV13) Never done    Lab Results  Component Value Date   WBC 13.4 (H) 09/04/2018   HGB 10.9 (L) 09/04/2018   HCT 34.4 (L) 09/04/2018   MCV 93.2 09/04/2018   PLT 230 09/04/2018     Assessment & Plan:   1. Mixed hyperlipidemia Well controlled.  No changes to medicines.  Continue to work on eating a  healthy diet and exercise.  Labs drawn today.  - Lipid panel  2. Supraventricular tachycardia ([place]) The current medical regimen is effective;  continue present plan and medications. Metoprolol. - Comprehensive metabolic panel - CBC with Differential/Platelet - TSH  3. Bronchiectasis with acute lower respiratory infection ([place]) Continue to follow with pulmonology.  4. Myalgia Check cpk/ESR.  5. Pain in other joint  - Rheumatoid factor - ANA,IFA RA Diag Pnl w/rflx Tit/Patn - Sedimentation Rate - C-reactive protein - Uric Acid - CYCLIC  CITRUL PEPTIDE ANTIBODY, IGG/IGA - CK  6. Pain in both lower extremities Checking ABIs.  7. Secondary hypothyroidism Check tsh. Increase dose of synthroid to 75 mcg daily. No orders of the defined types were placed in this encounter.  8. Chronic idiopathic constipation - recommended pt call GI.  9. Allergic rhinitis - continue singulair.  Follow-up: No follow-ups on file.    [name], MD

## 2019-10-02 ENCOUNTER — Ambulatory Visit (INDEPENDENT_AMBULATORY_CARE_PROVIDER_SITE_OTHER): Payer: Medicare Other | Admitting: Family Medicine

## 2019-10-02 ENCOUNTER — Encounter: Payer: Self-pay | Admitting: Family Medicine

## 2019-10-02 ENCOUNTER — Other Ambulatory Visit: Payer: Self-pay

## 2019-10-02 VITALS — BP 122/66 | HR 62 | Temp 97.4°F | Ht 65.5 in | Wt 171.0 lb

## 2019-10-02 DIAGNOSIS — E782 Mixed hyperlipidemia: Secondary | ICD-10-CM | POA: Diagnosis not present

## 2019-10-02 DIAGNOSIS — J301 Allergic rhinitis due to pollen: Secondary | ICD-10-CM

## 2019-10-02 DIAGNOSIS — M79605 Pain in left leg: Secondary | ICD-10-CM | POA: Diagnosis not present

## 2019-10-02 DIAGNOSIS — I471 Supraventricular tachycardia: Secondary | ICD-10-CM | POA: Diagnosis not present

## 2019-10-02 DIAGNOSIS — M791 Myalgia, unspecified site: Secondary | ICD-10-CM | POA: Insufficient documentation

## 2019-10-02 DIAGNOSIS — E038 Other specified hypothyroidism: Secondary | ICD-10-CM | POA: Diagnosis not present

## 2019-10-02 DIAGNOSIS — M2550 Pain in unspecified joint: Secondary | ICD-10-CM | POA: Insufficient documentation

## 2019-10-02 DIAGNOSIS — M79604 Pain in right leg: Secondary | ICD-10-CM | POA: Diagnosis not present

## 2019-10-02 DIAGNOSIS — T466X5A Adverse effect of antihyperlipidemic and antiarteriosclerotic drugs, initial encounter: Secondary | ICD-10-CM | POA: Insufficient documentation

## 2019-10-02 DIAGNOSIS — K5904 Chronic idiopathic constipation: Secondary | ICD-10-CM | POA: Diagnosis not present

## 2019-10-02 DIAGNOSIS — J470 Bronchiectasis with acute lower respiratory infection: Secondary | ICD-10-CM | POA: Diagnosis not present

## 2019-10-02 DIAGNOSIS — M2559 Pain in other specified joint: Secondary | ICD-10-CM | POA: Diagnosis not present

## 2019-10-02 MED ORDER — MONTELUKAST SODIUM 10 MG PO TABS: 10.0000 mg | ORAL_TABLET | Freq: Every day | ORAL | 3 refills | Status: DC

## 2019-10-02 NOTE — Patient Instructions (Addendum)
Labs done.  Start on Singulair 10- mg once daily at night.  Check on vascular referral. Pt to call Dr. Lyndel Safe.

## 2019-10-04 ENCOUNTER — Other Ambulatory Visit: Payer: Self-pay

## 2019-10-04 LAB — CK: Total CK: 62 U/L (ref 32–182)

## 2019-10-04 LAB — CBC WITH DIFFERENTIAL/PLATELET
Basophils Absolute: 0.1 10*3/uL (ref 0.0–0.2)
Basos: 1 %
EOS (ABSOLUTE): 1.2 10*3/uL — ABNORMAL HIGH (ref 0.0–0.4)
Eos: 17 %
Hematocrit: 37.6 % (ref 34.0–46.6)
Hemoglobin: 12.7 g/dL (ref 11.1–15.9)
Immature Grans (Abs): 0 10*3/uL (ref 0.0–0.1)
Immature Granulocytes: 0 %
Lymphocytes Absolute: 2.1 10*3/uL (ref 0.7–3.1)
Lymphs: 30 %
MCH: 31 pg (ref 26.6–33.0)
MCHC: 33.8 g/dL (ref 31.5–35.7)
MCV: 92 fL (ref 79–97)
Monocytes Absolute: 0.7 10*3/uL (ref 0.1–0.9)
Monocytes: 9 %
Neutrophils Absolute: 3.1 10*3/uL (ref 1.4–7.0)
Neutrophils: 43 %
Platelets: 236 10*3/uL (ref 150–450)
RBC: 4.1 x10E6/uL (ref 3.77–5.28)
RDW: 13.2 % (ref 11.7–15.4)
WBC: 7.2 10*3/uL (ref 3.4–10.8)

## 2019-10-04 LAB — COMPREHENSIVE METABOLIC PANEL
ALT: 33 IU/L — ABNORMAL HIGH (ref 0–32)
AST: 40 IU/L (ref 0–40)
Albumin/Globulin Ratio: 1.5 (ref 1.2–2.2)
Albumin: 4.2 g/dL (ref 3.7–4.7)
Alkaline Phosphatase: 133 IU/L — ABNORMAL HIGH (ref 39–117)
BUN/Creatinine Ratio: 18 (ref 12–28)
BUN: 17 mg/dL (ref 8–27)
Bilirubin Total: 0.2 mg/dL (ref 0.0–1.2)
CO2: 25 mmol/L (ref 20–29)
Calcium: 9.9 mg/dL (ref 8.7–10.3)
Chloride: 100 mmol/L (ref 96–106)
Creatinine, Ser: 0.96 mg/dL (ref 0.57–1.00)
GFR calc Af Amer: 68 mL/min/{1.73_m2} (ref >59)
GFR calc non Af Amer: 59 mL/min/{1.73_m2} — ABNORMAL LOW (ref >59)
Globulin, Total: 2.8 g/dL (ref 1.5–4.5)
Glucose: 100 mg/dL — ABNORMAL HIGH (ref 65–99)
Potassium: 4.6 mmol/L (ref 3.5–5.2)
Sodium: 137 mmol/L (ref 134–144)
Total Protein: 7 g/dL (ref 6.0–8.5)

## 2019-10-04 LAB — ANA,IFA RA DIAG PNL W/RFLX TIT/PATN
ANA Titer 1: NEGATIVE
Cyclic Citrullin Peptide Ab: 6 units (ref 0–19)
Rheumatoid fact SerPl-aCnc: <10 IU/mL (ref 0.0–13.9)

## 2019-10-04 LAB — LIPID PANEL
Chol/HDL Ratio: 3.8 ratio (ref 0.0–4.4)
Cholesterol, Total: 164 mg/dL (ref 100–199)
HDL: 43 mg/dL (ref >39)
LDL Chol Calc (NIH): 97 mg/dL (ref 0–99)
Triglycerides: 138 mg/dL (ref 0–149)
VLDL Cholesterol Cal: 24 mg/dL (ref 5–40)

## 2019-10-04 LAB — TSH: TSH: 3.97 u[IU]/mL (ref 0.450–4.500)

## 2019-10-04 LAB — C-REACTIVE PROTEIN: CRP: 8 mg/L (ref 0–10)

## 2019-10-04 LAB — CARDIOVASCULAR RISK ASSESSMENT

## 2019-10-04 LAB — SEDIMENTATION RATE: Sed Rate: 20 mm/hr (ref 0–40)

## 2019-10-04 LAB — URIC ACID: Uric Acid: 6.5 mg/dL (ref 3.1–7.9)

## 2019-10-04 MED ORDER — LEVOTHYROXINE SODIUM 75 MCG PO TABS: 75.0000 ug | ORAL_TABLET | Freq: Every day | ORAL | 0 refills | Status: DC

## 2019-10-06 DIAGNOSIS — E038 Other specified hypothyroidism: Secondary | ICD-10-CM | POA: Insufficient documentation

## 2019-10-06 DIAGNOSIS — E039 Hypothyroidism, unspecified: Secondary | ICD-10-CM | POA: Insufficient documentation

## 2019-10-06 DIAGNOSIS — J301 Allergic rhinitis due to pollen: Secondary | ICD-10-CM | POA: Insufficient documentation

## 2019-10-06 DIAGNOSIS — K5904 Chronic idiopathic constipation: Secondary | ICD-10-CM | POA: Insufficient documentation

## 2019-10-07 ENCOUNTER — Other Ambulatory Visit: Payer: Self-pay | Admitting: Family Medicine

## 2019-10-07 ENCOUNTER — Telehealth: Payer: Self-pay

## 2019-10-07 NOTE — Telephone Encounter (Signed)
Courtney [name] called requesting pain medication.  She reports her hydrocodone is not helping her leg pain and she wants oxycontin.  She has upcoming appointments for evaluation of her leg pain.  Dr. Tobie Poet is unable to fill pain medication because the  patient has a pain contract with pain management.  She was instructed to call pain management for pain medication changes.

## 2019-10-14 ENCOUNTER — Encounter: Payer: Self-pay | Admitting: Pulmonary Disease

## 2019-10-14 ENCOUNTER — Other Ambulatory Visit: Payer: Self-pay

## 2019-10-14 ENCOUNTER — Ambulatory Visit (INDEPENDENT_AMBULATORY_CARE_PROVIDER_SITE_OTHER): Payer: Medicare Other | Admitting: Pulmonary Disease

## 2019-10-14 VITALS — BP 126/70 | HR 76 | Temp 98.1°F | Ht 65.0 in | Wt 171.6 lb

## 2019-10-14 DIAGNOSIS — J479 Bronchiectasis, uncomplicated: Secondary | ICD-10-CM | POA: Diagnosis not present

## 2019-10-14 MED ORDER — PREDNISONE 10 MG PO TABS: ORAL_TABLET | ORAL | 1 refills | Status: DC

## 2019-10-14 MED ORDER — SULFAMETHOXAZOLE-TRIMETHOPRIM 800-160 MG PO TABS: 1.0000 | ORAL_TABLET | ORAL | 2 refills | Status: DC

## 2019-10-14 NOTE — Progress Notes (Signed)
Courtney [name]    [id number]    [birth year]  Primary Care Physician:[name], Elnita Maxwell, MD  Referring Physician: [name], MD [address] [place],  [name] [id number]  Chief complaint: Follow-up for recurrent pneumonia, bronchiectasis  HPI: 74 year old with history of SVT, hyperlipidemia, allergies, GERD She has episodes of recurrent bronchiectasis, pneumonia.  Treated with antibiotics over the past few months.  Continues to have cough with congestion, wheezing, dyspnea.  Also has significant symptoms of postnasal drip, rhinitis. Sent to pulmonary clinic for evaluation of recurrent bronchitis, suspected bronchiectasis.  Pets: No pets Occupation: Retired Marine scientist Exposures: No known exposures, no mold, hot tub, Jacuzzi.  Has a down comforter. ILD exposure questionnaire 10/14/2019 significant for nitrofurantoin in 2020 and down comforter. Smoking history: Never smoker Travel history: Likes to travel around the country in an RV Relevant family history: Brother and sister have asthma.  Interim history: Over the past year has had recurrent attacks of bronchitis/bronchiectasis exacerbation treated with antibiotics by primary care. Reports testing positive for Covid in September 2020 but did not need hospitaliation.  Also has recurrent UTIs for which she was given prolonged course of nitrofurantoin in 2020.  At last visit on 09/13/2019 we have asked her to get rid of the down comforter due to concern for hypersensitivity pneumonitis.  She is here for review of CT scan.  Outpatient Encounter Medications as of 10/14/2019  Medication Sig  . aspirin EC 81 MG tablet Take 81 mg by mouth daily.  . clopidogrel (PLAVIX) 75 MG tablet Take 1 tablet (75 mg total) by mouth daily.  . cyclobenzaprine (FLEXERIL) 10 MG tablet Take 1 tablet (10 mg total) by mouth 3 (three) times daily as needed for muscle spasms.  [name] docusate sodium (COLACE) 100 MG capsule Take 1 capsule (100 mg total) by  mouth 2 (two) times daily.  . fluticasone (FLONASE) 50 MCG/ACT nasal spray Place 2 sprays into both nostrils daily as needed for allergies.   . furosemide (LASIX) 20 MG tablet TAKE 1 TABLET BY MOUTH DAILY  . HYDROcodone-acetaminophen (NORCO) 10-325 MG tablet   . ibuprofen (ADVIL) 200 MG tablet Take by mouth.  . levothyroxine (SYNTHROID) 75 MCG tablet Take 1 tablet (75 mcg total) by mouth daily.  . metoprolol tartrate (LOPRESSOR) 25 MG tablet Take 50 mg by mouth 2 (two) times daily.   . montelukast (SINGULAIR) 10 MG tablet Take 1 tablet (10 mg total) by mouth at bedtime.  . nitroGLYCERIN (NITROSTAT) 0.4 MG SL tablet Place 0.4 mg under the tongue every 5 (five) minutes as needed for chest pain.   . potassium chloride SA (K-DUR,KLOR-CON) 20 MEQ tablet Take 20 mEq by mouth daily.  [name] REPATHA SURECLICK 462 MG/ML SOAJ Inject 140 mg into the skin every 14 (fourteen) days.  [name] Respiratory Therapy Supplies (FLUTTER) DEVI Use as directed.  [name] tiZANidine (ZANAFLEX) 4 MG tablet   . tretinoin (RETIN-A) 0.05 % cream tretinoin 0.05 % topical cream  . zolpidem (AMBIEN) 5 MG tablet   . [DISCONTINUED] oxyCODONE 10 MG TABS Take 1 tablet (10 mg total) by mouth every 4 (four) hours as needed for severe pain ((score 7 to 10)).   No facility-administered encounter medications on file as of 10/14/2019.   Physical Exam: Blood pressure 126/70, pulse 76, temperature 98.1 F (36.7 C), temperature source Temporal, height 5\' 5"  (1.651 m), weight 171 lb 9.6 oz (77.8 kg), SpO2 97 %. Gen:  No acute distress HEENT:  EOMI, sclera anicteric Neck:     No masses; no thyromegaly Lungs:    Clear to auscultation bilaterally; normal respiratory effort CV:         Regular rate and rhythm; no murmurs Abd:      + bowel sounds; soft, non-tender; no palpable masses, no distension Ext:    No edema; adequate peripheral perfusion Skin:      Warm and dry; no rash Neuro: alert and oriented x 3 Psych: normal mood and affect  Data  Reviewed: Imaging: CT angio 01/30/18- patchy multifocal groundglass opacities, nodular densities bilaterally left greater than right.  Patulous esophagus with mild thickening.  I have reviewed the images personally.  High-resolution CT 04/02/2018- mid and lower lung groundglass opacities, mild bronchiectasis and nodularity.  Aortic atherosclerosis, coronary artery calcification I reviewed the images personally.  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 I have reviewed the images personally.  PFTs: 03/21/2018 FVC 2.53 [90%), FEV1 2.29 [99%), F/F 90, TLC 85%, DLCO 87% Normal test  FENO 03/20/2018-24  Labs: CBC from primary care 03/05/2018-WBC 7, granulocytes 67%, lymphocytes 26%. 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  I have recommended a bronchoscopy for further evaluation with BAL and transbronchial biopsy but patient and her daughter in clinic are reluctant to undergo an invasive procedure.  Will be reasonable to give her a trial of prednisone for a month or 2 and monitor with repeat high-res CT. If her ILD is progressive in spite of this then will revisit question of bronchoscope versus surgical lung biopsy.  GERD,  ? aspiration History noted for gastric lap band, GERD and esophagitis on recent CT scan.  She may have esophageal dysfunction and recurrent aspiration.   She is planning to follow up with her surgeon to get her gastric lap band removed Consider barium swallow if she continues to have symptoms  Cough, postnasal drip Continue chlorphentermine, Flonase nasal spray  Plan/Recommendations: - Prednisone 40 mg a day for a month and then slow taper - Bactrim for pneumocystis prophylaxis  Follow-up in 1 to 2 months.  [name] MD [facility] 10/14/2019, 3:11 PM  CC: [name], MD

## 2019-10-14 NOTE — Patient Instructions (Addendum)
I have reviewed the CT scan which shows slightly worsened inflammation and scarring We are not sure if this is from the down exposure, nitrofurantoin or Covid I will put you on prednisone 40 mg a day.  Continue at this dose for 1 month and then reduce dose by 10 mg every 2 weeks We will also start Bactrim double strength 1 tablet 3 times a week Follow-up in 1 to 2 months.  Prednisone

## 2019-10-16 ENCOUNTER — Other Ambulatory Visit: Payer: Self-pay | Admitting: *Deleted

## 2019-10-16 ENCOUNTER — Telehealth: Payer: Self-pay | Admitting: Pulmonary Disease

## 2019-10-16 DIAGNOSIS — I739 Peripheral vascular disease, unspecified: Secondary | ICD-10-CM

## 2019-10-16 NOTE — Telephone Encounter (Signed)
Spoke with pt. He is requesting a prescription Hydroxychlorquine. States that she has been doing research and found that this medication is good for inflammation. Reports at her last OV, she was told that she has inflammation in her lungs.  Dr. Vaughan Browner - please advise. Thanks.

## 2019-10-17 ENCOUNTER — Telehealth: Payer: Self-pay | Admitting: Pulmonary Disease

## 2019-10-17 NOTE — Telephone Encounter (Signed)
Called and spoke with pt letting her know the info stated by Aaron Edelman and for her not to take Bactrim. Pt verbalized understanding. Stated to pt that we would send this to Dr. Vaughan Browner to see if there was a different med he wanted for her to take instead and she verbalized understanding.  While speaking with pt, she wanted to know if Dr. Vaughan Browner might be able to prescribe hydroxychloroquine to see if this would help improve her breathing.  Dr. Vaughan Browner, please advise on all this for pt.

## 2019-10-17 NOTE — Telephone Encounter (Signed)
10/17/2019  Please inform patient to hold Bactrim.  If patient starts to have worsened breathing or feeling like her throat is swelling up she will need to seek emergent care in emergency room or call 911.  Please add reaction to chart.   Please also route this message to Dr. Vaughan Browner as Juluis Rainier.   Dr. Vaughan Browner can decide next week if you would like for the patient to be on a different form of prophylaxis for PJP or just clinically monitor as patient trial steroids.  Wyn Quaker, FNP

## 2019-10-17 NOTE — Telephone Encounter (Signed)
Called and spoke with pt who stated she broke out in welps yesterday 4/28 and also was itching all over. Pt took a benadryl to see if it would help with the itching. She stated she just began bactim 4/26.  Pt stated she remembered a previous time being on bactrim when prescribed by PCP and the same thing happened that time as well.  Pt is not scheduled to take another dose of her bactrim until tomorrow 4/30.  Aaron Edelman, please advise on recommendations for pt since it seems like her med reaction is coming from bactrim.

## 2019-10-21 NOTE — Telephone Encounter (Signed)
Called pt and advised message from the provider. Pt understood and verbalized understanding. Nothing further is needed.    

## 2019-10-21 NOTE — Telephone Encounter (Signed)
Okay to stay off the Bactrim for now.  Will consider alternatives if we cannot get her off the prednisone  Please inform patient that I would not recommend hydroxychloroquine as it will not work with the kind of lung issues that she has.

## 2019-10-22 ENCOUNTER — Encounter: Payer: Medicare Other | Admitting: Vascular Surgery

## 2019-10-22 ENCOUNTER — Encounter (HOSPITAL_COMMUNITY): Payer: Medicare Other

## 2019-10-23 ENCOUNTER — Other Ambulatory Visit: Payer: Self-pay

## 2019-10-23 DIAGNOSIS — I739 Peripheral vascular disease, unspecified: Secondary | ICD-10-CM

## 2019-10-24 DIAGNOSIS — K224 Dyskinesia of esophagus: Secondary | ICD-10-CM | POA: Diagnosis not present

## 2019-10-24 DIAGNOSIS — Z9884 Bariatric surgery status: Secondary | ICD-10-CM | POA: Diagnosis not present

## 2019-10-24 NOTE — Telephone Encounter (Signed)
I had replied to same message on 5/3  Please inform patient that I would not recommend hydroxychloroquine as it will not work with the kind of lung issues that she has.

## 2019-10-24 NOTE — Telephone Encounter (Signed)
Dr. [name] - please advise. Thanks. 

## 2019-10-25 DIAGNOSIS — K828 Other specified diseases of gallbladder: Secondary | ICD-10-CM | POA: Diagnosis not present

## 2019-10-25 DIAGNOSIS — Z9884 Bariatric surgery status: Secondary | ICD-10-CM | POA: Diagnosis not present

## 2019-10-30 DIAGNOSIS — G894 Chronic pain syndrome: Secondary | ICD-10-CM | POA: Diagnosis not present

## 2019-10-30 DIAGNOSIS — M533 Sacrococcygeal disorders, not elsewhere classified: Secondary | ICD-10-CM | POA: Diagnosis not present

## 2019-10-30 DIAGNOSIS — M48062 Spinal stenosis, lumbar region with neurogenic claudication: Secondary | ICD-10-CM | POA: Diagnosis not present

## 2019-10-30 DIAGNOSIS — M5137 Other intervertebral disc degeneration, lumbosacral region: Secondary | ICD-10-CM | POA: Diagnosis not present

## 2019-10-30 NOTE — Progress Notes (Signed)
Not seen

## 2019-11-01 ENCOUNTER — Ambulatory Visit (INDEPENDENT_AMBULATORY_CARE_PROVIDER_SITE_OTHER): Payer: Medicare Other | Admitting: Family Medicine

## 2019-11-01 DIAGNOSIS — E038 Other specified hypothyroidism: Secondary | ICD-10-CM

## 2019-11-02 LAB — FUNGUS CULTURE W SMEAR
MICRO NUMBER:: 10312818
SMEAR:: NONE SEEN
SPECIMEN QUALITY:: ADEQUATE

## 2019-11-02 LAB — RESPIRATORY CULTURE OR RESPIRATORY AND SPUTUM CULTURE: MICRO NUMBER:: 10312820

## 2019-11-02 LAB — MYCOBACTERIA,CULT W/FLUOROCHROME SMEAR
MICRO NUMBER:: 10312819
SMEAR:: NONE SEEN
SPECIMEN QUALITY:: ADEQUATE

## 2019-11-14 ENCOUNTER — Other Ambulatory Visit: Payer: Self-pay

## 2019-11-14 ENCOUNTER — Encounter: Payer: Self-pay | Admitting: Pulmonary Disease

## 2019-11-14 ENCOUNTER — Ambulatory Visit (INDEPENDENT_AMBULATORY_CARE_PROVIDER_SITE_OTHER): Payer: Medicare Other | Admitting: Pulmonary Disease

## 2019-11-14 DIAGNOSIS — R0602 Shortness of breath: Secondary | ICD-10-CM

## 2019-11-14 NOTE — Patient Instructions (Signed)
We will get a high-resolution CT in 5 months Is okay to stay off the prednisone as you are having side effects from it  Follow-up in clinic after CT scan in 5 months.

## 2019-11-14 NOTE — Progress Notes (Signed)
Courtney [name]    [id number]    1946-01-28  Primary Care Physician:[name], Elnita Maxwell, MD  Referring Physician: [name], MD [address] [place],  [place] [address]  Chief complaint: Follow-up for recurrent pneumonia, bronchiectasis  HPI: 74 year old with history of SVT, hyperlipidemia, allergies, GERD She has episodes of recurrent bronchiectasis, pneumonia.  Treated with antibiotics over the past few months.  Continues to have cough with congestion, wheezing, dyspnea.  Also has significant symptoms of postnasal drip, rhinitis. Sent to pulmonary clinic for evaluation of recurrent bronchitis, suspected bronchiectasis.  Over the past year in 2020  has had recurrent attacks of bronchitis/bronchiectasis exacerbation treated with antibiotics by primary care. Reports testing positive for Covid in September 2020 but did not need hospitaliation.  Also has recurrent UTIs for which she was given prolonged course of nitrofurantoin in 2020.  Pets: No pets Occupation: Retired Marine scientist Exposures: No known exposures, no mold, hot tub, Jacuzzi.  Has a down comforter which she got rid off on 09/13/2019 ILD exposure questionnaire 10/14/2019 significant for nitrofurantoin in 2020 and down comforter. Smoking history: Never smoker Travel history: Likes to travel around the country in an RV Relevant family history: Brother and sister have asthma.  Interim history: Given a trial of prednisone at last visit.  She did not tolerated it well at all with side effects of rash, mood changes, insomnia and stopped it after 9 days.  She did not tolerate Bactrim for PCP prophylaxis as well.  States that breathing is actually doing better.  Remains off prednisone and nitrofurantoin  Outpatient Encounter Medications as of 11/14/2019  Medication Sig  . aspirin EC 81 MG tablet Take 81 mg by mouth daily.  . clopidogrel (PLAVIX) 75 MG tablet Take 1 tablet (75 mg total) by mouth daily.  .  cyclobenzaprine (FLEXERIL) 10 MG tablet Take 1 tablet (10 mg total) by mouth 3 (three) times daily as needed for muscle spasms.  [name] docusate sodium (COLACE) 100 MG capsule Take 1 capsule (100 mg total) by mouth 2 (two) times daily.  . fluticasone (FLONASE) 50 MCG/ACT nasal spray Place 2 sprays into both nostrils daily as needed for allergies.   . furosemide (LASIX) 20 MG tablet TAKE 1 TABLET BY MOUTH DAILY  . HYDROcodone-acetaminophen (NORCO) 10-325 MG tablet   . ibuprofen (ADVIL) 200 MG tablet Take by mouth.  . levothyroxine (SYNTHROID) 75 MCG tablet Take 1 tablet (75 mcg total) by mouth daily.  . metoprolol tartrate (LOPRESSOR) 25 MG tablet Take 50 mg by mouth 2 (two) times daily.   . montelukast (SINGULAIR) 10 MG tablet Take 1 tablet (10 mg total) by mouth at bedtime.  . nitroGLYCERIN (NITROSTAT) 0.4 MG SL tablet Place 0.4 mg under the tongue every 5 (five) minutes as needed for chest pain.   [name] oxyCODONE-acetaminophen (PERCOCET) 10-325 MG tablet Take by mouth.  . potassium chloride SA (K-DUR,KLOR-CON) 20 MEQ tablet Take 20 mEq by mouth daily.  [name] REPATHA SURECLICK 703 MG/ML SOAJ Inject 140 mg into the skin every 14 (fourteen) days.  [name] Respiratory Therapy Supplies (FLUTTER) DEVI Use as directed.  [name] tiZANidine (ZANAFLEX) 4 MG tablet   . tretinoin (RETIN-A) 0.05 % cream tretinoin 0.05 % topical cream  . zolpidem (AMBIEN) 5 MG tablet   . [DISCONTINUED] predniSONE (DELTASONE) 10 MG tablet 4 tabs x's 1 month, then decrease 10mg  every 2 weeks   No facility-administered encounter medications on file as  of 11/14/2019.   Physical Exam: Blood pressure 120/62, pulse 87, temperature (!) 97.3 F (36.3 C), temperature source Oral, height 5' 5.5" (1.664 m), weight 169 lb 3.2 oz (76.7 kg), SpO2 95 %. Gen:      No acute distress HEENT:  EOMI, sclera anicteric Neck:     No masses; no thyromegaly Lungs:    Clear to auscultation bilaterally; normal respiratory effort CV:         Regular rate and rhythm; no  murmurs Abd:      + bowel sounds; soft, non-tender; no palpable masses, no distension Ext:    No edema; adequate peripheral perfusion Skin:      Warm and dry; no rash Neuro: alert and oriented x 3 Psych: normal mood and affect  Data Reviewed: Imaging: CT angio 01/30/18- patchy multifocal groundglass opacities, nodular densities bilaterally left greater than right.  Patulous esophagus with mild thickening.  I have reviewed the images personally.  High-resolution CT 04/02/2018- mid and lower lung groundglass opacities, mild bronchiectasis and nodularity.  Aortic atherosclerosis, coronary artery calcification I reviewed the images personally.  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 I have reviewed the images personally.  PFTs: 03/21/2018 FVC 2.53 [90%), FEV1 2.29 [99%), F/F 90, TLC 85%, DLCO 87% Normal test  FENO 03/20/2018-24  Labs: CBC from primary care 03/05/2018-WBC 7, granulocytes 67%, lymphocytes 26%. 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  I have recommended a bronchoscopy for further evaluation with BAL and transbronchial biopsy but patient and her daughter in clinic are reluctant to undergo an invasive  procedure.  Has not tolerated a trial of prednisone  We have done allergens avoidance getting rid of the down comforter and nitrofurantoin. Repeat CT in 5 months to reassess. If her ILD is progressive then will revisit question of bronchoscope versus surgical lung biopsy.  GERD, ? aspiration History noted for gastric lap band, GERD and esophagitis on recent CT scan.  She may have esophageal dysfunction and recurrent aspiration that is causing the basal fibrosis. She is planning to follow up with her surgeon to get her gastric lap band removed Consider barium swallow if she continues to have symptoms  Cough, postnasal drip Continue chlorphentermine, Flonase nasal spray  Plan/Recommendations: Observe off prednisone Follow-up CT in 5 months   [name] MD [facility] 11/14/2019, 2:30 PM  CC: [name], MD

## 2019-11-17 ENCOUNTER — Other Ambulatory Visit: Payer: Self-pay | Admitting: Family Medicine

## 2019-11-29 DIAGNOSIS — Z01818 Encounter for other preprocedural examination: Secondary | ICD-10-CM | POA: Diagnosis not present

## 2019-12-03 DIAGNOSIS — K828 Other specified diseases of gallbladder: Secondary | ICD-10-CM | POA: Diagnosis not present

## 2019-12-03 DIAGNOSIS — K8050 Calculus of bile duct without cholangitis or cholecystitis without obstruction: Secondary | ICD-10-CM | POA: Diagnosis not present

## 2019-12-03 DIAGNOSIS — Z4651 Encounter for fitting and adjustment of gastric lap band: Secondary | ICD-10-CM | POA: Diagnosis not present

## 2019-12-05 ENCOUNTER — Other Ambulatory Visit: Payer: Self-pay | Admitting: Family Medicine

## 2019-12-19 HISTORY — PX: CHOLECYSTECTOMY: SHX55

## 2019-12-31 ENCOUNTER — Other Ambulatory Visit: Payer: Self-pay | Admitting: Family Medicine

## 2020-01-02 DIAGNOSIS — I491 Atrial premature depolarization: Secondary | ICD-10-CM | POA: Diagnosis not present

## 2020-01-02 DIAGNOSIS — R0602 Shortness of breath: Secondary | ICD-10-CM | POA: Diagnosis not present

## 2020-01-02 DIAGNOSIS — G8918 Other acute postprocedural pain: Secondary | ICD-10-CM | POA: Diagnosis not present

## 2020-01-02 DIAGNOSIS — R918 Other nonspecific abnormal finding of lung field: Secondary | ICD-10-CM | POA: Diagnosis not present

## 2020-01-02 DIAGNOSIS — R0789 Other chest pain: Secondary | ICD-10-CM | POA: Diagnosis not present

## 2020-01-02 DIAGNOSIS — K449 Diaphragmatic hernia without obstruction or gangrene: Secondary | ICD-10-CM | POA: Diagnosis not present

## 2020-01-02 DIAGNOSIS — Z9049 Acquired absence of other specified parts of digestive tract: Secondary | ICD-10-CM | POA: Diagnosis not present

## 2020-01-02 DIAGNOSIS — J95811 Postprocedural pneumothorax: Secondary | ICD-10-CM | POA: Diagnosis not present

## 2020-01-02 DIAGNOSIS — K223 Perforation of esophagus: Secondary | ICD-10-CM | POA: Diagnosis not present

## 2020-01-02 DIAGNOSIS — Z9889 Other specified postprocedural states: Secondary | ICD-10-CM | POA: Diagnosis not present

## 2020-01-02 DIAGNOSIS — R590 Localized enlarged lymph nodes: Secondary | ICD-10-CM | POA: Diagnosis not present

## 2020-01-02 DIAGNOSIS — R591 Generalized enlarged lymph nodes: Secondary | ICD-10-CM | POA: Diagnosis not present

## 2020-01-02 DIAGNOSIS — R079 Chest pain, unspecified: Secondary | ICD-10-CM | POA: Diagnosis not present

## 2020-01-02 DIAGNOSIS — K228 Other specified diseases of esophagus: Secondary | ICD-10-CM | POA: Diagnosis not present

## 2020-01-02 DIAGNOSIS — R1013 Epigastric pain: Secondary | ICD-10-CM | POA: Diagnosis not present

## 2020-01-03 DIAGNOSIS — R918 Other nonspecific abnormal finding of lung field: Secondary | ICD-10-CM | POA: Diagnosis not present

## 2020-01-03 DIAGNOSIS — K223 Perforation of esophagus: Secondary | ICD-10-CM | POA: Diagnosis not present

## 2020-01-15 DIAGNOSIS — I10 Essential (primary) hypertension: Secondary | ICD-10-CM | POA: Diagnosis not present

## 2020-01-15 DIAGNOSIS — M545 Low back pain: Secondary | ICD-10-CM | POA: Diagnosis not present

## 2020-01-15 DIAGNOSIS — E0500 Thyrotoxicosis with diffuse goiter without thyrotoxic crisis or storm: Secondary | ICD-10-CM | POA: Diagnosis not present

## 2020-01-15 DIAGNOSIS — Z992 Dependence on renal dialysis: Secondary | ICD-10-CM | POA: Diagnosis not present

## 2020-01-15 DIAGNOSIS — G8929 Other chronic pain: Secondary | ICD-10-CM | POA: Diagnosis not present

## 2020-01-15 DIAGNOSIS — I872 Venous insufficiency (chronic) (peripheral): Secondary | ICD-10-CM | POA: Diagnosis not present

## 2020-01-15 DIAGNOSIS — Z636 Dependent relative needing care at home: Secondary | ICD-10-CM | POA: Diagnosis not present

## 2020-01-15 DIAGNOSIS — Z8616 Personal history of COVID-19: Secondary | ICD-10-CM | POA: Diagnosis not present

## 2020-01-15 DIAGNOSIS — I83813 Varicose veins of bilateral lower extremities with pain: Secondary | ICD-10-CM | POA: Diagnosis not present

## 2020-01-15 DIAGNOSIS — I8393 Asymptomatic varicose veins of bilateral lower extremities: Secondary | ICD-10-CM | POA: Diagnosis not present

## 2020-01-15 DIAGNOSIS — Z9884 Bariatric surgery status: Secondary | ICD-10-CM | POA: Diagnosis not present

## 2020-01-15 DIAGNOSIS — M79605 Pain in left leg: Secondary | ICD-10-CM | POA: Diagnosis not present

## 2020-01-15 DIAGNOSIS — Z955 Presence of coronary angioplasty implant and graft: Secondary | ICD-10-CM | POA: Diagnosis not present

## 2020-01-15 DIAGNOSIS — M79604 Pain in right leg: Secondary | ICD-10-CM | POA: Diagnosis not present

## 2020-01-15 DIAGNOSIS — Z79899 Other long term (current) drug therapy: Secondary | ICD-10-CM | POA: Diagnosis not present

## 2020-01-22 ENCOUNTER — Other Ambulatory Visit: Payer: Self-pay | Admitting: Family Medicine

## 2020-01-24 ENCOUNTER — Other Ambulatory Visit: Payer: Self-pay | Admitting: Pulmonary Disease

## 2020-01-30 ENCOUNTER — Other Ambulatory Visit: Payer: Self-pay

## 2020-01-30 ENCOUNTER — Ambulatory Visit (INDEPENDENT_AMBULATORY_CARE_PROVIDER_SITE_OTHER): Payer: Medicare Other | Admitting: Family Medicine

## 2020-01-30 ENCOUNTER — Encounter: Payer: Self-pay | Admitting: Family Medicine

## 2020-01-30 VITALS — BP 124/70 | HR 68 | Temp 97.6°F | Resp 17 | Ht 65.5 in | Wt 169.0 lb

## 2020-01-30 DIAGNOSIS — R1013 Epigastric pain: Secondary | ICD-10-CM | POA: Diagnosis not present

## 2020-01-30 DIAGNOSIS — R1084 Generalized abdominal pain: Secondary | ICD-10-CM | POA: Diagnosis not present

## 2020-01-30 LAB — COMPREHENSIVE METABOLIC PANEL
ALT: 16 IU/L (ref 0–32)
AST: 23 IU/L (ref 0–40)
Albumin/Globulin Ratio: 1.8 (ref 1.2–2.2)
Albumin: 4.3 g/dL (ref 3.7–4.7)
Alkaline Phosphatase: 109 IU/L (ref 48–121)
BUN/Creatinine Ratio: 26 (ref 12–28)
BUN: 25 mg/dL (ref 8–27)
Bilirubin Total: 0.3 mg/dL (ref 0.0–1.2)
CO2: 25 mmol/L (ref 20–29)
Calcium: 9.6 mg/dL (ref 8.7–10.3)
Chloride: 97 mmol/L (ref 96–106)
Creatinine, Ser: 0.97 mg/dL (ref 0.57–1.00)
GFR calc Af Amer: 67 mL/min/{1.73_m2} (ref >59)
GFR calc non Af Amer: 58 mL/min/{1.73_m2} — ABNORMAL LOW (ref >59)
Globulin, Total: 2.4 g/dL (ref 1.5–4.5)
Glucose: 95 mg/dL (ref 65–99)
Potassium: 4.9 mmol/L (ref 3.5–5.2)
Sodium: 135 mmol/L (ref 134–144)
Total Protein: 6.7 g/dL (ref 6.0–8.5)

## 2020-01-30 LAB — CBC WITH DIFFERENTIAL/PLATELET
Basophils Absolute: 0.1 10*3/uL (ref 0.0–0.2)
Basos: 1 %
EOS (ABSOLUTE): 0.6 10*3/uL — ABNORMAL HIGH (ref 0.0–0.4)
Eos: 10 %
Hematocrit: 36.7 % (ref 34.0–46.6)
Hemoglobin: 12 g/dL (ref 11.1–15.9)
Immature Grans (Abs): 0 10*3/uL (ref 0.0–0.1)
Immature Granulocytes: 0 %
Lymphocytes Absolute: 2.1 10*3/uL (ref 0.7–3.1)
Lymphs: 33 %
MCH: 29.8 pg (ref 26.6–33.0)
MCHC: 32.7 g/dL (ref 31.5–35.7)
MCV: 91 fL (ref 79–97)
Monocytes Absolute: 0.5 10*3/uL (ref 0.1–0.9)
Monocytes: 8 %
Neutrophils Absolute: 3.1 10*3/uL (ref 1.4–7.0)
Neutrophils: 48 %
Platelets: 222 10*3/uL (ref 150–450)
RBC: 4.03 x10E6/uL (ref 3.77–5.28)
RDW: 13.1 % (ref 11.7–15.4)
WBC: 6.4 10*3/uL (ref 3.4–10.8)

## 2020-01-30 LAB — POCT URINALYSIS DIP (CLINITEK)
Blood, UA: NEGATIVE
Glucose, UA: NEGATIVE mg/dL
Ketones, POC UA: NEGATIVE mg/dL
Leukocytes, UA: NEGATIVE
Nitrite, UA: NEGATIVE
POC PROTEIN,UA: NEGATIVE
Spec Grav, UA: 1.015 (ref 1.010–1.025)
Urobilinogen, UA: 0.2 E.U./dL
pH, UA: 6 (ref 5.0–8.0)

## 2020-01-30 MED ORDER — OMEPRAZOLE 40 MG PO CPDR: 40.0000 mg | DELAYED_RELEASE_CAPSULE | Freq: Two times a day (BID) | ORAL | 30 refills | Status: DC

## 2020-01-30 NOTE — Progress Notes (Signed)
Acute Office Visit  Subjective:    Patient ID: Courtney [name], female    DOB: Jul 01, 1945, 74 y.o.   MRN: [id number]  Chief Complaint  Patient presents with  . Abdominal Pain  . Headache    HPI Patient is in today for abdominal pain since surgery last month done by Dr. Florene Glen at [facility]. She underwent cholecystectomy and removal of her lap band. The abdominal pain is worsening. And fairly constant. BMs every 3-4 days. Has tried linzess sporadically and it helps, but causes her to run for bathroom. No nausea, nor vomiting. Poor appetite. Eating does not make it worse. Malaise also. Patient sees Dr. Lyndel Safe.   Dysphagia has improved and now sleeping better since surgery.   It is noted she has increased ibuprofen to 800 mg three times a day. Patient has a distant history of H. Pylori.  Past Medical History:  Diagnosis Date  . Anemia   . Arthritis   . Bronchiectasis ([place])   . Chronic idiopathic constipation   . Depression, major, recurrent, moderate ([place])   . Headache   . Heart murmur   . History of bladder infections   . History of COVID-19   . Hyperlipidemia   . Hypertension   . Hypothyroidism   . Knee pain, bilateral   . Osteoarthritis   . Pneumonia   . Primary insomnia   . Recovering alcoholic ([place])   . SVT (supraventricular tachycardia) ([place])   . UTI (urinary tract infection)   . Varicose veins   . Vitamin B12 deficiency     Past Surgical History:  Procedure Laterality Date  . ABDOMINAL HYSTERECTOMY  1991  . ANGIOPLASTY     at least 15 years ago, pt. denies   . APPENDECTOMY    . AUGMENTATION MAMMAPLASTY Bilateral   . BACK SURGERY     had 2 surgeries. Have rods placed in back   . BARIATRIC SURGERY     lap band-at least 14 years ago  . BREAST BIOPSY Left    x2  . BREAST ENHANCEMENT SURGERY  2006  . CHOLECYSTECTOMY  12/2019   removed lap band.   . COLONOSCOPY  12/01/2016   Moderate predominantly sigmoid diverticulosis.   [name] EYE SURGERY     IOL- bilateral -  [place]  . KNEE ARTHROSCOPY Left   . lap band surgery  1981  . LUMBAR FUSION  07/29/2015   posterior level one  . removal of cervical disc fragments  10/2007   pt. denies     Family History  Problem Relation Age of Onset  . Heart disease Mother   . Heart disease Father   . Heart disease Brother   . Diabetes Sister   . Heart disease Brother   . Heart disease Brother   . Heart disease Brother   . Heart disease Brother   . Other Sister        BRAIN TUMOR    Social History   Socioeconomic History  . Marital status: Married    Spouse name: Not on file  . Number of children: 2  . Years of education: Not on file  . Highest education level: Not on file  Occupational History  . Occupation: retired    Comment: Marine scientist  Tobacco Use  . Smoking status: Never Smoker  . Smokeless tobacco: Never Used  Vaping Use  . Vaping Use: Never used  Substance and Sexual Activity  . Alcohol use: No    Alcohol/week: 0.0 standard drinks  Comment: recovery x 20 yrs  . Drug use: No  . Sexual activity: Not Currently    Comment: MARRIED  Other Topics Concern  . Not on file  Social History Narrative  . Not on file   Social Determinants of Health   Financial Resource Strain:   . Difficulty of Paying Living Expenses:   Food Insecurity:   . Worried About Charity fundraiser in the Last Year:   . Arboriculturist in the Last Year:   Transportation Needs:   . Film/video editor (Medical):   [name] Lack of Transportation (Non-Medical):   Physical Activity:   . Days of Exercise per Week:   . Minutes of Exercise per Session:   Stress:   . Feeling of Stress :   Social Connections:   . Frequency of Communication with Friends and Family:   . Frequency of Social Gatherings with Friends and Family:   . Attends Religious Services:   . Active Member of Clubs or Organizations:   . Attends Archivist Meetings:   [name] Marital Status:   Intimate Partner Violence:   . Fear of Current or  Ex-Partner:   . Emotionally Abused:   [name] Physically Abused:   . Sexually Abused:     Outpatient Medications Prior to Visit  Medication Sig Dispense Refill  . aspirin EC 81 MG tablet Take 81 mg by mouth daily.    [name] docusate sodium (COLACE) 100 MG capsule Take 1 capsule (100 mg total) by mouth 2 (two) times daily. 60 capsule 0  . fluticasone (FLONASE) 50 MCG/ACT nasal spray Place 2 sprays into both nostrils daily as needed for allergies.     . furosemide (LASIX) 20 MG tablet Take 1 tablet (20 mg total) by mouth daily. Call for fasting appointment. No further med refills until seen. 30 tablet 0  . ibuprofen (ADVIL) 200 MG tablet Take by mouth.    . levothyroxine (SYNTHROID) 75 MCG tablet TAKE 1 TABLET(75 MCG) BY MOUTH DAILY 90 tablet 3  . LORazepam (ATIVAN) 0.5 MG tablet TAKE 1 TABLET BY MOUTH EVERY DAY AS NEEDED FOR SEVERE ANXIETY 30 tablet 3  . metoprolol tartrate (LOPRESSOR) 50 MG tablet TAKE 1 TABLET(50 MG) BY MOUTH TWICE DAILY WITH MEALS 180 tablet 1  . montelukast (SINGULAIR) 10 MG tablet Take 1 tablet (10 mg total) by mouth at bedtime. 30 tablet 3  . nitroGLYCERIN (NITROSTAT) 0.4 MG SL tablet Place 0.4 mg under the tongue every 5 (five) minutes as needed for chest pain.   0  . oxyCODONE-acetaminophen (PERCOCET) 10-325 MG tablet     . potassium chloride SA (K-DUR,KLOR-CON) 20 MEQ tablet Take 20 mEq by mouth daily.    [name] Respiratory Therapy Supplies (FLUTTER) DEVI Use as directed. 1 each 0  . tiZANidine (ZANAFLEX) 4 MG tablet     . tretinoin (RETIN-A) 0.05 % cream tretinoin 0.05 % topical cream    . zolpidem (AMBIEN) 5 MG tablet     . clopidogrel (PLAVIX) 75 MG tablet Take 1 tablet (75 mg total) by mouth daily. 90 tablet 2  . cyclobenzaprine (FLEXERIL) 10 MG tablet Take 1 tablet (10 mg total) by mouth 3 (three) times daily as needed for muscle spasms. 50 tablet 1  . metoprolol tartrate (LOPRESSOR) 25 MG tablet Take 50 mg by mouth 2 (two) times daily.     . potassium chloride (KLOR-CON) 10  MEQ tablet TAKE 3 TABLETS BY MOUTH DAILY 270 tablet 0  . HYDROcodone-acetaminophen (NORCO) 10-325  MG tablet     . REPATHA SURECLICK 401 MG/ML SOAJ Inject 140 mg into the skin every 14 (fourteen) days.     No facility-administered medications prior to visit.    Allergies  Allergen Reactions  . Levofloxacin Hives, Shortness Of Breath and Swelling    RASH IN MOUTH   (can take IV route)  . Bactrim [Sulfamethoxazole-Trimethoprim] Hives and Itching  . Statins Other (See Comments)    Myalgias  . Gabapentin Nausea Only    Review of Systems  Constitutional: Negative for chills, fatigue and fever.  HENT: Negative for congestion, ear pain and sore throat.   Respiratory: Negative for cough and shortness of breath.   Cardiovascular: Negative for chest pain.  Gastrointestinal: Positive for abdominal pain and constipation. Negative for diarrhea, nausea and vomiting.  Genitourinary: Negative for dysuria and urgency.  Musculoskeletal: Positive for arthralgias, back pain and myalgias.  Skin: Negative for rash.  Neurological: Negative for dizziness and headaches.  Psychiatric/Behavioral: Negative for dysphoric mood. The patient is not nervous/anxious.        Objective:    Physical Exam Vitals reviewed.  Constitutional:      Appearance: She is well-developed.  Cardiovascular:     Rate and Rhythm: Normal rate and regular rhythm.     Heart sounds: Normal heart sounds. No murmur heard.   Pulmonary:     Effort: Pulmonary effort is normal. No respiratory distress.     Breath sounds: No wheezing, rhonchi or rales.  Abdominal:     Tenderness: There is abdominal tenderness in the epigastric area. There is no guarding or rebound. Negative signs include Murphy's sign.  Neurological:     Mental Status: She is alert.  Psychiatric:        Mood and Affect: Mood normal.        Behavior: Behavior normal.    BP 124/70 (BP Location: Right Arm, Patient Position: Sitting)   Pulse 68   Temp 97.6 F  (36.4 C) (Temporal)   Resp 17   Ht 5' 5.5" (1.664 m)   Wt 169 lb (76.7 kg)   SpO2 98%   BMI 27.70 kg/m  Wt Readings from Last 3 Encounters:  01/30/20 169 lb (76.7 kg)  11/14/19 169 lb 3.2 oz (76.7 kg)  10/14/19 171 lb 9.6 oz (77.8 kg)    Health Maintenance Due  Topic Date Due  . TETANUS/TDAP  Never done  . COLONOSCOPY  Never done  . DEXA SCAN  Never done  . PNA vac Low Risk Adult (1 of 2 - PCV13) Never done  . INFLUENZA VACCINE  01/19/2020    There are no preventive care reminders to display for this patient.   Lab Results  Component Value Date   TSH 3.970 10/02/2019   Lab Results  Component Value Date   WBC 7.2 10/02/2019   HGB 12.7 10/02/2019   HCT 37.6 10/02/2019   MCV 92 10/02/2019   PLT 236 10/02/2019   Lab Results  Component Value Date   NA 137 10/02/2019   K 4.6 10/02/2019   CO2 25 10/02/2019   GLUCOSE 100 (H) 10/02/2019   BUN 17 10/02/2019   CREATININE 0.96 10/02/2019   BILITOT 0.2 10/02/2019   ALKPHOS 133 (H) 10/02/2019   AST 40 10/02/2019   ALT 33 (H) 10/02/2019   PROT 7.0 10/02/2019   ALBUMIN 4.2 10/02/2019   CALCIUM 9.9 10/02/2019   ANIONGAP 9 09/04/2018   Lab Results  Component Value Date   CHOL 164 10/02/2019  Lab Results  Component Value Date   HDL 43 10/02/2019   Lab Results  Component Value Date   LDLCALC 97 10/02/2019   Lab Results  Component Value Date   TRIG 138 10/02/2019   Lab Results  Component Value Date   CHOLHDL 3.8 10/02/2019   No results found for: HGBA1C     Assessment & Plan:  1. Generalized abdominal pain - POCT URINALYSIS DIP (CLINITEK)  2. Epigastric abdominal pain Start on omeprazole 40 mg one twice a day.  Stop ibuprofen.  - CBC with Differential/Platelet - Comprehensive metabolic panel - H. pylori breath test  Patient became flushed after taking solution for breathtek. Her bp increased to 156/78. Using a fan and drinking cold better resolved symptoms.     Meds ordered this encounter    Medications  . omeprazole (PRILOSEC) 40 MG capsule    Sig: Take 1 capsule (40 mg total) by mouth 2 (two) times daily.    Dispense:  60 capsule    Refill:  30    Orders Placed This Encounter  Procedures  . CBC with Differential/Platelet  . Comprehensive metabolic panel  . H. pylori breath test  . POCT URINALYSIS DIP (CLINITEK)     Follow-up: Return in about 4 weeks (around 02/27/2020) for abdominal pain.  An After Visit Summary was printed and given to the patient.  [name] [facility] [phone]

## 2020-01-30 NOTE — Patient Instructions (Signed)
Stop ibuprofen  Start omeprazole 40 mg one twice a day.

## 2020-01-31 LAB — H. PYLORI BREATH TEST: H pylori Breath Test: POSITIVE — AB

## 2020-02-01 ENCOUNTER — Other Ambulatory Visit: Payer: Self-pay | Admitting: Family Medicine

## 2020-02-01 MED ORDER — AMOXICILLIN 500 MG PO TABS: 1000.0000 mg | ORAL_TABLET | Freq: Two times a day (BID) | ORAL | 0 refills | Status: DC

## 2020-02-01 MED ORDER — CLARITHROMYCIN 500 MG PO TABS: 500.0000 mg | ORAL_TABLET | Freq: Two times a day (BID) | ORAL | 0 refills | Status: DC

## 2020-02-05 ENCOUNTER — Other Ambulatory Visit: Payer: Self-pay

## 2020-02-05 MED ORDER — ONDANSETRON HCL 4 MG PO TABS: 4.0000 mg | ORAL_TABLET | Freq: Three times a day (TID) | ORAL | 0 refills | Status: DC | PRN

## 2020-02-05 NOTE — Telephone Encounter (Signed)
Courtney [name] is experiencing extreme nausea with the antibiotics.  She is requesting medication for nausea.  Dr. Tobie Poet approved zofran 4 mg every 8 hours as needed for nausea.

## 2020-02-17 ENCOUNTER — Ambulatory Visit (INDEPENDENT_AMBULATORY_CARE_PROVIDER_SITE_OTHER): Payer: Medicare Other | Admitting: Family Medicine

## 2020-02-17 ENCOUNTER — Other Ambulatory Visit: Payer: Self-pay

## 2020-02-17 VITALS — BP 138/74 | HR 81 | Temp 98.1°F | Ht 65.5 in | Wt 169.0 lb

## 2020-02-17 DIAGNOSIS — R1013 Epigastric pain: Secondary | ICD-10-CM

## 2020-02-17 DIAGNOSIS — R112 Nausea with vomiting, unspecified: Secondary | ICD-10-CM | POA: Diagnosis not present

## 2020-02-17 DIAGNOSIS — R1084 Generalized abdominal pain: Secondary | ICD-10-CM

## 2020-02-17 MED ORDER — DICYCLOMINE HCL 20 MG PO TABS: 20.0000 mg | ORAL_TABLET | Freq: Three times a day (TID) | ORAL | 1 refills | Status: DC

## 2020-02-17 NOTE — Progress Notes (Signed)
Acute Office Visit  Subjective:    Patient ID: Courtney [name], female    DOB: 01/11/1946, 74 y.o.   MRN: [id number]  Chief Complaint  Patient presents with  . Gastroesophageal Reflux    HPI Patient is in today for persistent abdominal pain and persistent nausea. Gassy. In 11/2019 her lap band was surgically removed as well as her gallbladder. Ct of abdomen and pelvis was done at the time and it showed an enlarged lymph node. She is scheduled to have MRCP/MRI abdomen in 02/2020. EGD done at the time of her admission was normal. Last colonscopy was 2 years ago and was told by Dr Lyndel Safe that it was normal.  treated her with clarithromycin, amoxicillin, and omeprazole for positive H Pylori breath test.. Pt stopped ibuprofen as instructed.  Despite this she is continuing to have abdominal pain.  Patient has intermittent constipation followed by diarrhea, but takes linzess about every other day.   Past Medical History:  Diagnosis Date  . Anemia   . Arthritis   . Bronchiectasis ([place])   . Chronic idiopathic constipation   . Depression, major, recurrent, moderate ([place])   . Headache   . Heart murmur   . History of bladder infections   . History of COVID-19   . Hyperlipidemia   . Hypertension   . Hypothyroidism   . Knee pain, bilateral   . Osteoarthritis   . Pneumonia   . Primary insomnia   . Recovering alcoholic ([place])   . SVT (supraventricular tachycardia) ([place])   . UTI (urinary tract infection)   . Varicose veins   . Vitamin B12 deficiency     Past Surgical History:  Procedure Laterality Date  . ABDOMINAL HYSTERECTOMY  1991  . ANGIOPLASTY     at least 15 years ago, pt. denies   . APPENDECTOMY    . AUGMENTATION MAMMAPLASTY Bilateral   . BACK SURGERY     had 2 surgeries. Have rods placed in back   . BARIATRIC SURGERY     lap band-at least 14 years ago  . BREAST BIOPSY Left    x2  . BREAST ENHANCEMENT SURGERY  2006  . CHOLECYSTECTOMY  12/2019   removed lap band.   .  COLONOSCOPY  12/01/2016   Moderate predominantly sigmoid diverticulosis.   [name] EYE SURGERY     IOL- bilateral - [facility]  . KNEE ARTHROSCOPY Left   . lap band surgery  1981  . LUMBAR FUSION  07/29/2015   posterior level one  . removal of cervical disc fragments  10/2007   pt. denies     Family History  Problem Relation Age of Onset  . Heart disease Mother   . Heart disease Father   . Heart disease Brother   . Diabetes Sister   . Heart disease Brother   . Heart disease Brother   . Heart disease Brother   . Heart disease Brother   . Other Sister        BRAIN TUMOR    Social History   Socioeconomic History  . Marital status: Married    Spouse name: Not on file  . Number of children: 2  . Years of education: Not on file  . Highest education level: Not on file  Occupational History  . Occupation: retired    Comment: Marine scientist  Tobacco Use  . Smoking status: Never Smoker  . Smokeless tobacco: Never Used  Vaping Use  . Vaping Use: Never used  Substance and Sexual Activity  .  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 Drug use: No  . Sexual activity: Not Currently    Comment: MARRIED  Other Topics Concern  . Not on file  Social History Narrative  . Not on file   Social Determinants of Health   Financial Resource Strain:   . Difficulty of Paying Living Expenses: Not on file  Food Insecurity:   . Worried About Charity fundraiser in the Last Year: Not on file  . Ran Out of Food in the Last Year: Not on file  Transportation Needs:   . Lack of Transportation (Medical): Not on file  . Lack of Transportation (Non-Medical): Not on file  Physical Activity:   . Days of Exercise per Week: Not on file  . Minutes of Exercise per Session: Not on file  Stress:   . Feeling of Stress : Not on file  Social Connections:   . Frequency of Communication with Friends and Family: Not on file  . Frequency of Social Gatherings with Friends  and Family: Not on file  . Attends Religious Services: Not on file  . Active Member of Clubs or Organizations: Not on file  . Attends Archivist Meetings: Not on file  . Marital Status: Not on file  Intimate Partner Violence:   . Fear of Current or Ex-Partner: Not on file  . Emotionally Abused: Not on file  . Physically Abused: Not on file  . Sexually Abused: Not on file    Outpatient Medications Prior to Visit  Medication Sig Dispense Refill  . aspirin EC 81 MG tablet Take 81 mg by mouth daily.    [name] docusate sodium (COLACE) 100 MG capsule Take 1 capsule (100 mg total) by mouth 2 (two) times daily. 60 capsule 0  . fluticasone (FLONASE) 50 MCG/ACT nasal spray Place 2 sprays into both nostrils daily as needed for allergies.     . furosemide (LASIX) 20 MG tablet Take 1 tablet (20 mg total) by mouth daily. Call for fasting appointment. No further med refills until seen. 30 tablet 0  . ibuprofen (ADVIL) 200 MG tablet Take by mouth.    . levothyroxine (SYNTHROID) 75 MCG tablet TAKE 1 TABLET(75 MCG) BY MOUTH DAILY 90 tablet 3  . LORazepam (ATIVAN) 0.5 MG tablet TAKE 1 TABLET BY MOUTH EVERY DAY AS NEEDED FOR SEVERE ANXIETY 30 tablet 3  . metoprolol tartrate (LOPRESSOR) 50 MG tablet TAKE 1 TABLET(50 MG) BY MOUTH TWICE DAILY WITH MEALS 180 tablet 1  . montelukast (SINGULAIR) 10 MG tablet Take 1 tablet (10 mg total) by mouth at bedtime. 30 tablet 3  . nitroGLYCERIN (NITROSTAT) 0.4 MG SL tablet Place 0.4 mg under the tongue every 5 (five) minutes as needed for chest pain.   0  . omeprazole (PRILOSEC) 40 MG capsule Take 1 capsule (40 mg total) by mouth 2 (two) times daily. 60 capsule 30  . ondansetron (ZOFRAN) 4 MG tablet Take 1 tablet (4 mg total) by mouth every 8 (eight) hours as needed. 30 tablet 0  . oxyCODONE-acetaminophen (PERCOCET) 10-325 MG tablet     . potassium chloride SA (K-DUR,KLOR-CON) 20 MEQ tablet Take 20 mEq by mouth daily.    [name] Respiratory Therapy Supplies (FLUTTER) DEVI  Use as directed. 1 each 0  . tiZANidine (ZANAFLEX) 4 MG tablet     . tretinoin (RETIN-A) 0.05 % cream tretinoin 0.05 % topical cream    . zolpidem (  AMBIEN) 5 MG tablet     . amoxicillin (AMOXIL) 500 MG tablet Take 2 tablets (1,000 mg total) by mouth 2 (two) times daily. 56 tablet 0  . clarithromycin (BIAXIN) 500 MG tablet Take 1 tablet (500 mg total) by mouth 2 (two) times daily. 28 tablet 0   No facility-administered medications prior to visit.    Allergies  Allergen Reactions  . Levofloxacin Hives, Shortness Of Breath and Swelling    RASH IN MOUTH   (can take IV route)  . Bactrim [Sulfamethoxazole-Trimethoprim] Hives and Itching  . Clarithromycin     GI upset  . Statins Other (See Comments)    Myalgias  . Gabapentin Nausea Only    Review of Systems  Constitutional: Positive for fatigue. Negative for chills and fever.  HENT: Negative for congestion, ear pain and sore throat.   Respiratory: Negative for cough and shortness of breath.   Cardiovascular: Negative for chest pain.  Gastrointestinal: Positive for abdominal pain, constipation, diarrhea, nausea and vomiting.  Genitourinary: Negative for dysuria and urgency.  Musculoskeletal: Positive for arthralgias, back pain and myalgias.       Patient sees a pain clinic  Skin: Negative for rash.  Neurological: Negative for dizziness and headaches.  Psychiatric/Behavioral: Negative for dysphoric mood. The patient is not nervous/anxious.        Objective:    Physical Exam Vitals reviewed.  Constitutional:      Appearance: Normal appearance. She is normal weight.  Cardiovascular:     Rate and Rhythm: Normal rate and regular rhythm.     Pulses: Normal pulses.     Heart sounds: Normal heart sounds.  Pulmonary:     Effort: Pulmonary effort is normal. No respiratory distress.     Breath sounds: Normal breath sounds.  Abdominal:     General: Abdomen is flat. Bowel sounds are normal. There is no distension.     Palpations:  Abdomen is soft.     Tenderness: There is abdominal tenderness (Diffuse.  But more focused in the epigastric region.). There is no guarding or rebound.  Neurological:     Mental Status: She is alert and oriented to person, place, and time.  Psychiatric:        Mood and Affect: Mood normal.        Behavior: Behavior normal.     BP 138/74   Pulse 81   Temp 98.1 F (36.7 C)   Ht 5' 5.5" (1.664 m)   Wt 169 lb (76.7 kg)   SpO2 98%   BMI 27.70 kg/m  Wt Readings from Last 3 Encounters:  02/17/20 169 lb (76.7 kg)  01/30/20 169 lb (76.7 kg)  11/14/19 169 lb 3.2 oz (76.7 kg)    Health Maintenance Due  Topic Date Due  . TETANUS/TDAP  Never done  . COLONOSCOPY  Never done  . DEXA SCAN  Never done  . PNA vac Low Risk Adult (1 of 2 - PCV13) Never done  . INFLUENZA VACCINE  01/19/2020    There are no preventive care reminders to display for this patient.   Lab Results  Component Value Date   TSH 3.970 10/02/2019   Lab Results  Component Value Date   WBC 6.4 01/30/2020   HGB 12.0 01/30/2020   HCT 36.7 01/30/2020   MCV 91 01/30/2020   PLT 222 01/30/2020   Lab Results  Component Value Date   NA 135 01/30/2020   K 4.9 01/30/2020   CO2 25 01/30/2020   GLUCOSE 95  01/30/2020   BUN 25 01/30/2020   CREATININE 0.97 01/30/2020   BILITOT 0.3 01/30/2020   ALKPHOS 109 01/30/2020   AST 23 01/30/2020   ALT 16 01/30/2020   PROT 6.7 01/30/2020   ALBUMIN 4.3 01/30/2020   CALCIUM 9.6 01/30/2020   ANIONGAP 9 09/04/2018   Lab Results  Component Value Date   CHOL 164 10/02/2019   Lab Results  Component Value Date   HDL 43 10/02/2019   Lab Results  Component Value Date   LDLCALC 97 10/02/2019   Lab Results  Component Value Date   TRIG 138 10/02/2019   Lab Results  Component Value Date   CHOLHDL 3.8 10/02/2019   No results found for: HGBA1C       Assessment & Plan:  1. Epigastric abdominal pain Continue omeprazole twice daily. - CBC with Differential/Platelet -  Comprehensive metabolic panel  2. Generalized abdominal pain Concerning that this may be IBS. Prescription given for dicyclomine before every meal and nightly. Recommend follow-up with GI.  3. Non-intractable vomiting with nausea, unspecified vomiting type As Zofran prescription - Lipase - CBC with Differential/Platelet - Comprehensive metabolic panel   [name], MD

## 2020-02-18 LAB — CBC WITH DIFFERENTIAL/PLATELET
Basophils Absolute: 0.1 10*3/uL (ref 0.0–0.2)
Basos: 1 %
EOS (ABSOLUTE): 0.3 10*3/uL (ref 0.0–0.4)
Eos: 3 %
Hematocrit: 36.3 % (ref 34.0–46.6)
Hemoglobin: 12 g/dL (ref 11.1–15.9)
Immature Grans (Abs): 0 10*3/uL (ref 0.0–0.1)
Immature Granulocytes: 0 %
Lymphocytes Absolute: 3.2 10*3/uL — ABNORMAL HIGH (ref 0.7–3.1)
Lymphs: 30 %
MCH: 29.3 pg (ref 26.6–33.0)
MCHC: 33.1 g/dL (ref 31.5–35.7)
MCV: 89 fL (ref 79–97)
Monocytes Absolute: 0.9 10*3/uL (ref 0.1–0.9)
Monocytes: 8 %
Neutrophils Absolute: 6.3 10*3/uL (ref 1.4–7.0)
Neutrophils: 58 %
Platelets: 255 10*3/uL (ref 150–450)
RBC: 4.1 x10E6/uL (ref 3.77–5.28)
RDW: 13.1 % (ref 11.7–15.4)
WBC: 10.8 10*3/uL (ref 3.4–10.8)

## 2020-02-18 LAB — COMPREHENSIVE METABOLIC PANEL
ALT: 25 IU/L (ref 0–32)
AST: 24 IU/L (ref 0–40)
Albumin/Globulin Ratio: 1.7 (ref 1.2–2.2)
Albumin: 4.3 g/dL (ref 3.7–4.7)
Alkaline Phosphatase: 120 IU/L (ref 48–121)
BUN/Creatinine Ratio: 21 (ref 12–28)
BUN: 18 mg/dL (ref 8–27)
Bilirubin Total: 0.4 mg/dL (ref 0.0–1.2)
CO2: 29 mmol/L (ref 20–29)
Calcium: 9.7 mg/dL (ref 8.7–10.3)
Chloride: 96 mmol/L (ref 96–106)
Creatinine, Ser: 0.87 mg/dL (ref 0.57–1.00)
GFR calc Af Amer: 76 mL/min/{1.73_m2} (ref >59)
GFR calc non Af Amer: 66 mL/min/{1.73_m2} (ref >59)
Globulin, Total: 2.6 g/dL (ref 1.5–4.5)
Glucose: 87 mg/dL (ref 65–99)
Potassium: 4.4 mmol/L (ref 3.5–5.2)
Sodium: 136 mmol/L (ref 134–144)
Total Protein: 6.9 g/dL (ref 6.0–8.5)

## 2020-02-18 LAB — LIPASE: Lipase: 26 U/L (ref 14–85)

## 2020-02-20 ENCOUNTER — Encounter: Payer: Self-pay | Admitting: Family Medicine

## 2020-02-27 ENCOUNTER — Other Ambulatory Visit: Payer: Self-pay

## 2020-02-27 ENCOUNTER — Ambulatory Visit: Payer: Medicare Other | Admitting: Family Medicine

## 2020-02-27 ENCOUNTER — Ambulatory Visit (INDEPENDENT_AMBULATORY_CARE_PROVIDER_SITE_OTHER): Payer: Medicare Other | Admitting: Physician Assistant

## 2020-02-27 ENCOUNTER — Encounter: Payer: Self-pay | Admitting: Physician Assistant

## 2020-02-27 VITALS — BP 138/72 | HR 82 | Temp 97.7°F | Ht 65.5 in | Wt 169.0 lb

## 2020-02-27 DIAGNOSIS — N3001 Acute cystitis with hematuria: Secondary | ICD-10-CM | POA: Diagnosis not present

## 2020-02-27 DIAGNOSIS — R3129 Other microscopic hematuria: Secondary | ICD-10-CM | POA: Diagnosis not present

## 2020-02-27 LAB — POCT URINALYSIS DIPSTICK
Bilirubin, UA: NEGATIVE
Glucose, UA: NEGATIVE
Ketones, UA: NEGATIVE
Nitrite, UA: NEGATIVE
Protein, UA: NEGATIVE
Spec Grav, UA: 1.025 (ref 1.010–1.025)
Urobilinogen, UA: NEGATIVE E.U./dL — AB
pH, UA: 7 (ref 5.0–8.0)

## 2020-02-27 MED ORDER — DOXYCYCLINE HYCLATE 100 MG PO TABS: 100.0000 mg | ORAL_TABLET | Freq: Two times a day (BID) | ORAL | 0 refills | Status: DC

## 2020-02-27 MED ORDER — PHENAZOPYRIDINE HCL 200 MG PO TABS: 200.0000 mg | ORAL_TABLET | Freq: Three times a day (TID) | ORAL | 0 refills | Status: DC | PRN

## 2020-02-27 NOTE — Progress Notes (Signed)
Acute Office Visit  Subjective:    Patient ID: Courtney [name], female    DOB: December 10, 1945, 74 y.o.   MRN: [id number]  Chief Complaint  Patient presents with  . Urinary Tract Infection    Urinary frequency and burning.    HPI Patient is in today for UTI symptoms - dysuria, urgency and nocturia for the past 5 days  Denies nausea, vomiting, fever, back pain Has not had abdominal pain  Past Medical History:  Diagnosis Date  . Anemia   . Arthritis   . Bronchiectasis ([place])   . Chronic idiopathic constipation   . Depression, major, recurrent, moderate ([place])   . Headache   . Heart murmur   . History of bladder infections   . History of COVID-19   . Hyperlipidemia   . Hypertension   . Hypothyroidism   . Knee pain, bilateral   . Osteoarthritis   . Pneumonia   . Primary insomnia   . Recovering alcoholic ([place])   . SVT (supraventricular tachycardia) ([place])   . UTI (urinary tract infection)   . Varicose veins   . Vitamin B12 deficiency     Past Surgical History:  Procedure Laterality Date  . ABDOMINAL HYSTERECTOMY  1991  . ANGIOPLASTY     at least 15 years ago, pt. denies   . APPENDECTOMY    . AUGMENTATION MAMMAPLASTY Bilateral   . BACK SURGERY     had 2 surgeries. Have rods placed in back   . BARIATRIC SURGERY     lap band-at least 14 years ago  . BREAST BIOPSY Left    x2  . BREAST ENHANCEMENT SURGERY  2006  . CHOLECYSTECTOMY  12/2019   removed lap band.   . COLONOSCOPY  12/01/2016   Moderate predominantly sigmoid diverticulosis.   [name] EYE SURGERY     IOL- bilateral - [facility]  . KNEE ARTHROSCOPY Left   . lap band surgery  1981  . LUMBAR FUSION  07/29/2015   posterior level one  . removal of cervical disc fragments  10/2007   pt. denies     Family History  Problem Relation Age of Onset  . Heart disease Mother   . Heart disease Father   . Heart disease Brother   . Diabetes Sister   . Heart disease Brother   . Heart disease Brother   . Heart disease  Brother   . Heart disease Brother   . Other Sister        BRAIN TUMOR    Social History   Socioeconomic History  . Marital status: Married    Spouse name: Not on file  . Number of children: 2  . Years of education: Not on file  . Highest education level: Not on file  Occupational History  . Occupation: retired    Comment: Marine scientist  Tobacco Use  . Smoking status: Never Smoker  . Smokeless tobacco: Never Used  Vaping Use  . Vaping Use: Never used  Substance and Sexual Activity  . 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 Drug use: No  . Sexual activity: Not Currently    Comment: MARRIED  Other Topics Concern  . Not on file  Social History Narrative  . Not on file   Social Determinants of Health   Financial Resource Strain:   . Difficulty of Paying Living Expenses: Not on file  Food Insecurity:   . Worried About Running  Out of Food in the Last Year: Not on file  . Ran Out of Food in the Last Year: Not on file  Transportation Needs:   . Lack of Transportation (Medical): Not on file  . Lack of Transportation (Non-Medical): Not on file  Physical Activity:   . Days of Exercise per Week: Not on file  . Minutes of Exercise per Session: Not on file  Stress:   . Feeling of Stress : Not on file  Social Connections:   . Frequency of Communication with Friends and Family: Not on file  . Frequency of Social Gatherings with Friends and Family: Not on file  . Attends Religious Services: Not on file  . Active Member of Clubs or Organizations: Not on file  . Attends Archivist Meetings: Not on file  . Marital Status: Not on file  Intimate Partner Violence:   . Fear of Current or Ex-Partner: Not on file  . Emotionally Abused: Not on file  . Physically Abused: Not on file  . Sexually Abused: Not on file     Current Outpatient Medications:  .  aspirin EC 81 MG tablet, Take 81 mg by mouth daily., Disp: , Rfl:  .   dicyclomine (BENTYL) 20 MG tablet, Take 1 tablet (20 mg total) by mouth 4 (four) times daily -  before meals and at bedtime., Disp: 120 tablet, Rfl: 1 .  docusate sodium (COLACE) 100 MG capsule, Take 1 capsule (100 mg total) by mouth 2 (two) times daily., Disp: 60 capsule, Rfl: 0 .  fluticasone (FLONASE) 50 MCG/ACT nasal spray, Place 2 sprays into both nostrils daily as needed for allergies. , Disp: , Rfl:  .  furosemide (LASIX) 20 MG tablet, Take 1 tablet (20 mg total) by mouth daily. Call for fasting appointment. No further med refills until seen., Disp: 30 tablet, Rfl: 0 .  levothyroxine (SYNTHROID) 75 MCG tablet, TAKE 1 TABLET(75 MCG) BY MOUTH DAILY, Disp: 90 tablet, Rfl: 3 .  LORazepam (ATIVAN) 0.5 MG tablet, TAKE 1 TABLET BY MOUTH EVERY DAY AS NEEDED FOR SEVERE ANXIETY, Disp: 30 tablet, Rfl: 3 .  metoprolol tartrate (LOPRESSOR) 50 MG tablet, TAKE 1 TABLET(50 MG) BY MOUTH TWICE DAILY WITH MEALS, Disp: 180 tablet, Rfl: 1 .  montelukast (SINGULAIR) 10 MG tablet, Take 1 tablet (10 mg total) by mouth at bedtime., Disp: 30 tablet, Rfl: 3 .  nitroGLYCERIN (NITROSTAT) 0.4 MG SL tablet, Place 0.4 mg under the tongue every 5 (five) minutes as needed for chest pain. , Disp: , Rfl: 0 .  omeprazole (PRILOSEC) 40 MG capsule, Take 1 capsule (40 mg total) by mouth 2 (two) times daily., Disp: 60 capsule, Rfl: 30 .  ondansetron (ZOFRAN) 4 MG tablet, Take 1 tablet (4 mg total) by mouth every 8 (eight) hours as needed., Disp: 30 tablet, Rfl: 0 .  oxyCODONE-acetaminophen (PERCOCET) 10-325 MG tablet, , Disp: , Rfl:  .  potassium chloride SA (K-DUR,KLOR-CON) 20 MEQ tablet, Take 20 mEq by mouth daily., Disp: , Rfl:  .  Respiratory Therapy Supplies (FLUTTER) DEVI, Use as directed., Disp: 1 each, Rfl: 0 .  tiZANidine (ZANAFLEX) 4 MG tablet, , Disp: , Rfl:  .  tretinoin (RETIN-A) 0.05 % cream, tretinoin 0.05 % topical cream, Disp: , Rfl:  .  zolpidem (AMBIEN) 5 MG tablet, , Disp: , Rfl:  .  doxycycline (VIBRA-TABS) 100  MG tablet, Take 1 tablet (100 mg total) by mouth 2 (two) times daily., Disp: 20 tablet, Rfl: 0 .  phenazopyridine (PYRIDIUM)  200 MG tablet, Take 1 tablet (200 mg total) by mouth 3 (three) times daily as needed for pain., Disp: 10 tablet, Rfl: 0   Allergies  Allergen Reactions  . Levofloxacin Hives, Shortness Of Breath and Swelling    RASH IN MOUTH   (can take IV route)  . Bactrim [Sulfamethoxazole-Trimethoprim] Hives and Itching  . Clarithromycin     GI upset  . Macrobid [Nitrofurantoin]     Not tolerate  . Statins Other (See Comments)    Myalgias  . Gabapentin Nausea Only    CONSTITUTIONAL: Negative for chills, fatigue, fever, unintentional weight gain and unintentional weight loss.  CARDIOVASCULAR: Negative for chest pain, dizziness, palpitations and pedal edema.  RESPIRATORY: Negative for recent cough and dyspnea.  GASTROINTESTINAL: Negative for abdominal pain, acid reflux symptoms, constipation, diarrhea, nausea and vomiting.  GU- see HPI        Objective:    PHYSICAL EXAM:   VS: BP 138/72 (BP Location: Left Arm, Patient Position: Sitting)   Pulse 82   Temp 97.7 F (36.5 C) (Temporal)   Ht 5' 5.5" (1.664 m)   Wt 169 lb (76.7 kg)   SpO2 99%   BMI 27.70 kg/m   GEN: Well nourished, well developed, in no acute distress  Cardiac: RRR; no murmurs, rubs, or gallops,no edema -  Respiratory:  normal respiratory rate and pattern with no distress - normal breath sounds with no rales, rhonchi, wheezes or rubs Psych: euthymic mood, appropriate affect and demeanor  Office Visit on 02/27/2020  Component Date Value Ref Range Status  . Glucose, UA 02/27/2020 Negative  Negative Final  . Bilirubin, UA 02/27/2020 Negative   Final  . Ketones, UA 02/27/2020 Negative   Final  . Spec Grav, UA 02/27/2020 1.025  1.010 - 1.025 Final  . Blood, UA 02/27/2020 + 3   Final  . pH, UA 02/27/2020 7.0  5.0 - 8.0 Final  . Protein, UA 02/27/2020 Negative  Negative Final  . Urobilinogen, UA  02/27/2020 negative* 0.2 or 1.0 E.U./dL Final  . Nitrite, UA 02/27/2020 Negative   Final  . Leukocytes, UA 02/27/2020 Large (3+)* Negative Final    Wt Readings from Last 3 Encounters:  02/27/20 169 lb (76.7 kg)  02/17/20 169 lb (76.7 kg)  01/30/20 169 lb (76.7 kg)    Health Maintenance Due  Topic Date Due  . TETANUS/TDAP  Never done  . COLONOSCOPY  Never done  . DEXA SCAN  Never done  . PNA vac Low Risk Adult (1 of 2 - PCV13) Never done  . INFLUENZA VACCINE  01/19/2020  . MAMMOGRAM  02/27/2020    There are no preventive care reminders to display for this patient.        Assessment & Plan:   Problem List Items Addressed This Visit      Genitourinary   Acute cystitis with hematuria - Primary    Urine culture pending rx for doxycycline and pyridium Push water Follow up if symptoms persist/worsen      Relevant Orders   POCT urinalysis dipstick (Completed)   Urine Culture       Meds ordered this encounter  Medications  . doxycycline (VIBRA-TABS) 100 MG tablet    Sig: Take 1 tablet (100 mg total) by mouth 2 (two) times daily.    Dispense:  20 tablet    Refill:  0    Order Specific Question:   Supervising Provider    AnswerRochel Brome [id number]  . phenazopyridine (PYRIDIUM) 200  MG tablet    Sig: Take 1 tablet (200 mg total) by mouth 3 (three) times daily as needed for pain.    Dispense:  10 tablet    Refill:  0    Order Specific Question:   Supervising Provider    Answer:   [name], Lynder Parents     [place], PA-C

## 2020-02-27 NOTE — Assessment & Plan Note (Signed)
Urine culture pending rx for doxycycline and pyridium Push water Follow up if symptoms persist/worsen

## 2020-03-02 LAB — URINE CULTURE

## 2020-03-09 DIAGNOSIS — R591 Generalized enlarged lymph nodes: Secondary | ICD-10-CM | POA: Diagnosis not present

## 2020-03-09 DIAGNOSIS — N281 Cyst of kidney, acquired: Secondary | ICD-10-CM | POA: Diagnosis not present

## 2020-03-09 DIAGNOSIS — K7689 Other specified diseases of liver: Secondary | ICD-10-CM | POA: Diagnosis not present

## 2020-03-09 DIAGNOSIS — R590 Localized enlarged lymph nodes: Secondary | ICD-10-CM | POA: Diagnosis not present

## 2020-03-11 ENCOUNTER — Ambulatory Visit (INDEPENDENT_AMBULATORY_CARE_PROVIDER_SITE_OTHER): Payer: Medicare Other | Admitting: Physician Assistant

## 2020-03-11 ENCOUNTER — Encounter: Payer: Self-pay | Admitting: Physician Assistant

## 2020-03-11 DIAGNOSIS — N3000 Acute cystitis without hematuria: Secondary | ICD-10-CM

## 2020-03-11 LAB — POCT UA - MICROSCOPIC ONLY

## 2020-03-11 MED ORDER — PHENAZOPYRIDINE HCL 200 MG PO TABS: 200.0000 mg | ORAL_TABLET | Freq: Three times a day (TID) | ORAL | 0 refills | Status: DC | PRN

## 2020-03-11 MED ORDER — AMOXICILLIN-POT CLAVULANATE 875-125 MG PO TABS: 1.0000 | ORAL_TABLET | Freq: Two times a day (BID) | ORAL | 0 refills | Status: DC

## 2020-03-11 NOTE — Progress Notes (Signed)
Pt still with symptoms and microscopic exam showed 5-10 wbc/field Will re culture urine rx for augmentin and pyridium

## 2020-03-16 ENCOUNTER — Other Ambulatory Visit: Payer: Medicare Other

## 2020-03-17 LAB — URINE CULTURE

## 2020-03-18 ENCOUNTER — Other Ambulatory Visit: Payer: Self-pay | Admitting: Family Medicine

## 2020-03-18 DIAGNOSIS — N3000 Acute cystitis without hematuria: Secondary | ICD-10-CM

## 2020-03-18 MED ORDER — CIPROFLOXACIN HCL 500 MG PO TABS: 500.0000 mg | ORAL_TABLET | Freq: Two times a day (BID) | ORAL | 0 refills | Status: DC

## 2020-03-19 DIAGNOSIS — N3020 Other chronic cystitis without hematuria: Secondary | ICD-10-CM | POA: Diagnosis not present

## 2020-03-19 DIAGNOSIS — M961 Postlaminectomy syndrome, not elsewhere classified: Secondary | ICD-10-CM | POA: Diagnosis not present

## 2020-03-19 DIAGNOSIS — Z79899 Other long term (current) drug therapy: Secondary | ICD-10-CM | POA: Diagnosis not present

## 2020-03-19 DIAGNOSIS — G894 Chronic pain syndrome: Secondary | ICD-10-CM | POA: Diagnosis not present

## 2020-03-19 DIAGNOSIS — N3000 Acute cystitis without hematuria: Secondary | ICD-10-CM | POA: Diagnosis not present

## 2020-03-19 DIAGNOSIS — M48062 Spinal stenosis, lumbar region with neurogenic claudication: Secondary | ICD-10-CM | POA: Diagnosis not present

## 2020-03-19 DIAGNOSIS — N952 Postmenopausal atrophic vaginitis: Secondary | ICD-10-CM | POA: Diagnosis not present

## 2020-04-03 ENCOUNTER — Other Ambulatory Visit: Payer: Self-pay | Admitting: Family Medicine

## 2020-04-15 ENCOUNTER — Other Ambulatory Visit: Payer: Medicare Other

## 2020-04-16 ENCOUNTER — Ambulatory Visit: Payer: Medicare Other | Admitting: Family Medicine

## 2020-04-24 NOTE — Progress Notes (Signed)
Cancelled.  

## 2020-04-27 ENCOUNTER — Inpatient Hospital Stay: Admission: RE | Admit: 2020-04-27 | Payer: Medicare Other | Source: Ambulatory Visit

## 2020-04-27 ENCOUNTER — Ambulatory Visit (INDEPENDENT_AMBULATORY_CARE_PROVIDER_SITE_OTHER): Payer: Medicare Other | Admitting: Family Medicine

## 2020-04-27 DIAGNOSIS — E782 Mixed hyperlipidemia: Secondary | ICD-10-CM

## 2020-04-27 DIAGNOSIS — I471 Supraventricular tachycardia: Secondary | ICD-10-CM

## 2020-04-27 DIAGNOSIS — E038 Other specified hypothyroidism: Secondary | ICD-10-CM

## 2020-05-07 DIAGNOSIS — Z23 Encounter for immunization: Secondary | ICD-10-CM | POA: Diagnosis not present

## 2020-05-11 ENCOUNTER — Encounter: Payer: Self-pay | Admitting: Family Medicine

## 2020-05-11 ENCOUNTER — Other Ambulatory Visit: Payer: Self-pay

## 2020-05-11 ENCOUNTER — Ambulatory Visit (INDEPENDENT_AMBULATORY_CARE_PROVIDER_SITE_OTHER): Payer: Medicare Other | Admitting: Family Medicine

## 2020-05-11 VITALS — BP 130/60 | HR 60 | Temp 97.3°F | Resp 16 | Ht 65.5 in | Wt 180.2 lb

## 2020-05-11 DIAGNOSIS — R300 Dysuria: Secondary | ICD-10-CM | POA: Diagnosis not present

## 2020-05-11 DIAGNOSIS — M545 Low back pain, unspecified: Secondary | ICD-10-CM

## 2020-05-11 DIAGNOSIS — M79604 Pain in right leg: Secondary | ICD-10-CM | POA: Diagnosis not present

## 2020-05-11 DIAGNOSIS — M79605 Pain in left leg: Secondary | ICD-10-CM

## 2020-05-11 DIAGNOSIS — G8929 Other chronic pain: Secondary | ICD-10-CM | POA: Diagnosis not present

## 2020-05-11 LAB — POCT URINALYSIS DIPSTICK
Bilirubin, UA: NEGATIVE
Blood, UA: NEGATIVE
Glucose, UA: NEGATIVE
Ketones, UA: NEGATIVE
Leukocytes, UA: NEGATIVE
Nitrite, UA: NEGATIVE
Protein, UA: NEGATIVE
Spec Grav, UA: 1.02 (ref 1.010–1.025)
Urobilinogen, UA: 0.2 E.U./dL
pH, UA: 5.5 (ref 5.0–8.0)

## 2020-05-11 MED ORDER — GABAPENTIN 600 MG PO TABS: 600.0000 mg | ORAL_TABLET | Freq: Three times a day (TID) | ORAL | 3 refills | Status: DC

## 2020-05-11 NOTE — Progress Notes (Signed)
Acute Office Visit  Subjective:    Patient ID: Courtney [name], female    DOB: Jun 30, 1945, 74 y.o.   MRN: [id number]  Chief Complaint  Patient presents with  . Urinary Tract Infection    HPI Patient is in today for increased frequency, dysuria, malaise, and urinary pressure. No hematuria. No cva tenderness. No constipation. No diarrhea, fever, chills.  Past Medical History:  Diagnosis Date  . Anemia   . Arthritis   . Bronchiectasis ([place])   . Chronic idiopathic constipation   . Depression, major, recurrent, moderate ([place])   . Headache   . Heart murmur   . History of bladder infections   . History of COVID-19   . Hyperlipidemia   . Hypertension   . Hypothyroidism   . Knee pain, bilateral   . Osteoarthritis   . Pneumonia   . Primary insomnia   . Recovering alcoholic ([place])   . SVT (supraventricular tachycardia) ([place])   . UTI (urinary tract infection)   . Varicose veins   . Vitamin B12 deficiency     Past Surgical History:  Procedure Laterality Date  . ABDOMINAL HYSTERECTOMY  1991  . ANGIOPLASTY     at least 15 years ago, pt. denies   . APPENDECTOMY    . AUGMENTATION MAMMAPLASTY Bilateral   . BACK SURGERY     had 2 surgeries. Have rods placed in back   . BARIATRIC SURGERY     lap band-at least 14 years ago  . BREAST BIOPSY Left    x2  . BREAST ENHANCEMENT SURGERY  2006  . CHOLECYSTECTOMY  12/2019   removed lap band.   . COLONOSCOPY  12/01/2016   Moderate predominantly sigmoid diverticulosis.   [name] EYE SURGERY     IOL- bilateral - [facility]  . KNEE ARTHROSCOPY Left   . lap band surgery  1981  . LUMBAR FUSION  07/29/2015   posterior level one  . removal of cervical disc fragments  10/2007   pt. denies     Family History  Problem Relation Age of Onset  . Heart disease Mother   . Heart disease Father   . Heart disease Brother   . Diabetes Sister   . Heart disease Brother   . Heart disease Brother   . Heart disease Brother   . Heart disease Brother     . Other Sister        BRAIN TUMOR    Social History   Socioeconomic History  . Marital status: Married    Spouse name: Not on file  . Number of children: 2  . Years of education: Not on file  . Highest education level: Not on file  Occupational History  . Occupation: retired    Comment: Marine scientist  Tobacco Use  . Smoking status: Never Smoker  . Smokeless tobacco: Never Used  Vaping Use  . Vaping Use: Never used  Substance and Sexual Activity  . 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 Drug use: No  . Sexual activity: Not Currently    Comment: MARRIED  Other Topics Concern  . Not on file  Social History Narrative  . Not on file   Social Determinants of Health   Financial Resource Strain:   . Difficulty of Paying Living Expenses: Not on file  Food Insecurity:   . Worried About Charity fundraiser in the Last Year: Not on file  .  Ran Out of Food in the Last Year: Not on file  Transportation Needs:   . Lack of Transportation (Medical): Not on file  . Lack of Transportation (Non-Medical): Not on file  Physical Activity:   . Days of Exercise per Week: Not on file  . Minutes of Exercise per Session: Not on file  Stress:   . Feeling of Stress : Not on file  Social Connections:   . Frequency of Communication with Friends and Family: Not on file  . Frequency of Social Gatherings with Friends and Family: Not on file  . Attends Religious Services: Not on file  . Active Member of Clubs or Organizations: Not on file  . Attends Archivist Meetings: Not on file  . Marital Status: Not on file  Intimate Partner Violence:   . Fear of Current or Ex-Partner: Not on file  . Emotionally Abused: Not on file  . Physically Abused: Not on file  . Sexually Abused: Not on file    Outpatient Medications Prior to Visit  Medication Sig Dispense Refill  . aspirin EC 81 MG tablet Take 81 mg by mouth daily.    [name] dicyclomine (BENTYL) 20  MG tablet Take 1 tablet (20 mg total) by mouth 4 (four) times daily -  before meals and at bedtime. 120 tablet 1  . docusate sodium (COLACE) 100 MG capsule Take 1 capsule (100 mg total) by mouth 2 (two) times daily. 60 capsule 0  . fluticasone (FLONASE) 50 MCG/ACT nasal spray Place 2 sprays into both nostrils daily as needed for allergies.     . furosemide (LASIX) 20 MG tablet Take 1 tablet (20 mg total) by mouth daily. Call for fasting appointment. No further med refills until seen. 30 tablet 0  . levothyroxine (SYNTHROID) 75 MCG tablet TAKE 1 TABLET(75 MCG) BY MOUTH DAILY 90 tablet 3  . LORazepam (ATIVAN) 0.5 MG tablet TAKE 1 TABLET BY MOUTH EVERY DAY AS NEEDED FOR SEVERE ANXIETY 30 tablet 3  . metoprolol tartrate (LOPRESSOR) 50 MG tablet TAKE 1 TABLET(50 MG) BY MOUTH TWICE DAILY WITH MEALS 180 tablet 1  . montelukast (SINGULAIR) 10 MG tablet Take 1 tablet (10 mg total) by mouth at bedtime. 30 tablet 3  . nitroGLYCERIN (NITROSTAT) 0.4 MG SL tablet Place 0.4 mg under the tongue every 5 (five) minutes as needed for chest pain.   0  . omeprazole (PRILOSEC) 40 MG capsule Take 1 capsule (40 mg total) by mouth 2 (two) times daily. 60 capsule 30  . potassium chloride SA (K-DUR,KLOR-CON) 20 MEQ tablet Take 20 mEq by mouth daily.    [name] Respiratory Therapy Supplies (FLUTTER) DEVI Use as directed. 1 each 0  . tiZANidine (ZANAFLEX) 4 MG tablet     . tretinoin (RETIN-A) 0.05 % cream tretinoin 0.05 % topical cream    . zolpidem (AMBIEN) 5 MG tablet TAKE 1 TABLET(5 MG) BY MOUTH EVERY DAY AT BEDTIME 30 tablet 2  . ondansetron (ZOFRAN) 4 MG tablet Take 1 tablet (4 mg total) by mouth every 8 (eight) hours as needed. 30 tablet 0  . oxyCODONE-acetaminophen (PERCOCET) 10-325 MG tablet     . estradiol (ESTRACE) 0.1 MG/GM vaginal cream at bedtime.    [name] amoxicillin-clavulanate (AUGMENTIN) 875-125 MG tablet Take 1 tablet by mouth 2 (two) times daily. 20 tablet 0  . ciprofloxacin (CIPRO) 500 MG tablet Take 1 tablet (500  mg total) by mouth 2 (two) times daily. 14 tablet 0  . gabapentin (NEURONTIN) 300 MG capsule in  the morning, at noon, and at bedtime.    . phenazopyridine (PYRIDIUM) 200 MG tablet Take 1 tablet (200 mg total) by mouth 3 (three) times daily as needed for pain. 10 tablet 0   No facility-administered medications prior to visit.    Allergies  Allergen Reactions  . Levofloxacin Hives, Shortness Of Breath and Swelling    RASH IN MOUTH   (can take IV route)  . Bactrim [Sulfamethoxazole-Trimethoprim] Hives and Itching  . Clarithromycin     GI upset  . Lyrica [Pregabalin]   . Macrobid [Nitrofurantoin]     Not tolerate  . Statins Other (See Comments)    Myalgias    Review of Systems  Constitutional: Positive for fatigue. Negative for chills and fever.  HENT: Negative for congestion, ear pain and sore throat.   Respiratory: Negative for cough and shortness of breath.   Cardiovascular: Positive for leg swelling. Negative for chest pain and palpitations.  Gastrointestinal: Positive for abdominal pain.  Genitourinary: Positive for dysuria, frequency and urgency.  Psychiatric/Behavioral: Negative for dysphoric mood. The patient is not nervous/anxious.        Objective:    Physical Exam Vitals reviewed.  Constitutional:      Appearance: Normal appearance. She is normal weight.  Cardiovascular:     Rate and Rhythm: Normal rate and regular rhythm.     Pulses: Normal pulses.     Heart sounds: Normal heart sounds.  Pulmonary:     Effort: Pulmonary effort is normal. No respiratory distress.     Breath sounds: Normal breath sounds.  Abdominal:     General: Abdomen is flat. Bowel sounds are normal.     Palpations: Abdomen is soft.     Tenderness: There is no abdominal tenderness.  Neurological:     Mental Status: She is alert and oriented to person, place, and time.  Psychiatric:        Mood and Affect: Mood normal.        Behavior: Behavior normal.    BP 130/60   Pulse 60   Temp  (!) 97.3 F (36.3 C)   Resp 16   Ht 5' 5.5" (1.664 m)   Wt 180 lb 3.2 oz (81.7 kg)   BMI 29.53 kg/m  Wt Readings from Last 3 Encounters:  05/11/20 180 lb 3.2 oz (81.7 kg)  02/27/20 169 lb (76.7 kg)  02/17/20 169 lb (76.7 kg)    Health Maintenance Due  Topic Date Due  . TETANUS/TDAP  Never done  . COLONOSCOPY  Never done  . DEXA SCAN  Never done  . PNA vac Low Risk Adult (1 of 2 - PCV13) Never done  . INFLUENZA VACCINE  01/19/2020  . MAMMOGRAM  02/27/2020    There are no preventive care reminders to display for this patient.   Lab Results  Component Value Date   TSH 3.970 10/02/2019   Lab Results  Component Value Date   WBC 10.8 02/17/2020   HGB 12.0 02/17/2020   HCT 36.3 02/17/2020   MCV 89 02/17/2020   PLT 255 02/17/2020   Lab Results  Component Value Date   NA 136 02/17/2020   K 4.4 02/17/2020   CO2 29 02/17/2020   GLUCOSE 87 02/17/2020   BUN 18 02/17/2020   CREATININE 0.87 02/17/2020   BILITOT 0.4 02/17/2020   ALKPHOS 120 02/17/2020   AST 24 02/17/2020   ALT 25 02/17/2020   PROT 6.9 02/17/2020   ALBUMIN 4.3 02/17/2020   CALCIUM 9.7 02/17/2020  ANIONGAP 9 09/04/2018   Lab Results  Component Value Date   CHOL 164 10/02/2019   Lab Results  Component Value Date   HDL 43 10/02/2019   Lab Results  Component Value Date   LDLCALC 97 10/02/2019   Lab Results  Component Value Date   TRIG 138 10/02/2019   Lab Results  Component Value Date   CHOLHDL 3.8 10/02/2019   No results found for: HGBA1C     Assessment & Plan:  1. Dysuria - no evidence of a bladder infection.  - POCT urinalysis dipstick  2. Pain in both lower extremities Increase gabapentin. Inconsistent with claudication.  3. Chronic midline low back pain without sciatica  Increase gabapentin 600 mg one three times a day.   Recommend call neurosurgeon, Dr. Arnoldo Morale to follow up.  Order an EMG/NCV.  Meds ordered this encounter  Medications  . gabapentin (NEURONTIN) 600 MG  tablet    Sig: Take 1 tablet (600 mg total) by mouth 3 (three) times daily.    Dispense:  90 tablet    Refill:  3    Orders Placed This Encounter  Procedures  . POCT urinalysis dipstick     I spent 20 minutes dedicated to the care of this patient on the date of this encounter to include face-to-face time with the patient, as well as: reviewing her chart.  Follow-up: No follow-ups on file.  An After Visit Summary was printed and given to the patient.  [name], MD [facility] [phone]

## 2020-05-15 ENCOUNTER — Other Ambulatory Visit: Payer: Self-pay | Admitting: Family Medicine

## 2020-05-19 ENCOUNTER — Ambulatory Visit (INDEPENDENT_AMBULATORY_CARE_PROVIDER_SITE_OTHER): Payer: Medicare Other | Admitting: Family Medicine

## 2020-05-19 DIAGNOSIS — E782 Mixed hyperlipidemia: Secondary | ICD-10-CM

## 2020-05-27 DIAGNOSIS — H43393 Other vitreous opacities, bilateral: Secondary | ICD-10-CM | POA: Diagnosis not present

## 2020-05-27 DIAGNOSIS — D3131 Benign neoplasm of right choroid: Secondary | ICD-10-CM | POA: Diagnosis not present

## 2020-05-27 DIAGNOSIS — H209 Unspecified iridocyclitis: Secondary | ICD-10-CM | POA: Diagnosis not present

## 2020-05-27 DIAGNOSIS — H18413 Arcus senilis, bilateral: Secondary | ICD-10-CM | POA: Diagnosis not present

## 2020-05-27 DIAGNOSIS — Z961 Presence of intraocular lens: Secondary | ICD-10-CM | POA: Diagnosis not present

## 2020-06-06 ENCOUNTER — Other Ambulatory Visit: Payer: Self-pay | Admitting: Physician Assistant

## 2020-06-06 MED ORDER — CIPROFLOXACIN HCL 500 MG PO TABS: 500.0000 mg | ORAL_TABLET | Freq: Two times a day (BID) | ORAL | 0 refills | Status: DC

## 2020-06-08 ENCOUNTER — Encounter: Payer: Self-pay | Admitting: Family Medicine

## 2020-06-08 ENCOUNTER — Other Ambulatory Visit: Payer: Self-pay

## 2020-06-08 ENCOUNTER — Ambulatory Visit (INDEPENDENT_AMBULATORY_CARE_PROVIDER_SITE_OTHER): Payer: Medicare Other | Admitting: Family Medicine

## 2020-06-08 VITALS — BP 138/70 | HR 69 | Temp 97.7°F | Resp 16 | Ht 65.5 in | Wt 182.0 lb

## 2020-06-08 DIAGNOSIS — N760 Acute vaginitis: Secondary | ICD-10-CM

## 2020-06-08 DIAGNOSIS — R102 Pelvic and perineal pain: Secondary | ICD-10-CM | POA: Diagnosis not present

## 2020-06-08 DIAGNOSIS — B9689 Other specified bacterial agents as the cause of diseases classified elsewhere: Secondary | ICD-10-CM

## 2020-06-08 LAB — POCT URINALYSIS DIP (CLINITEK)
Bilirubin, UA: NEGATIVE
Blood, UA: NEGATIVE
Glucose, UA: NEGATIVE mg/dL
Ketones, POC UA: NEGATIVE mg/dL
Leukocytes, UA: NEGATIVE
Nitrite, UA: NEGATIVE
POC PROTEIN,UA: NEGATIVE
Spec Grav, UA: 1.015 (ref 1.010–1.025)
Urobilinogen, UA: 0.2 E.U./dL
pH, UA: 8 (ref 5.0–8.0)

## 2020-06-08 MED ORDER — FUROSEMIDE 20 MG PO TABS: 20.0000 mg | ORAL_TABLET | Freq: Every day | ORAL | 3 refills | Status: DC

## 2020-06-08 MED ORDER — POTASSIUM CHLORIDE CRYS ER 20 MEQ PO TBCR: 20.0000 meq | EXTENDED_RELEASE_TABLET | Freq: Every day | ORAL | 3 refills | Status: DC

## 2020-06-08 MED ORDER — METRONIDAZOLE 500 MG PO TABS: 500.0000 mg | ORAL_TABLET | Freq: Three times a day (TID) | ORAL | 0 refills | Status: DC

## 2020-06-08 NOTE — Progress Notes (Signed)
Acute Office Visit  Subjective:    Patient ID: Courtney [name], female    DOB: 1945/11/22, 74 y.o.   MRN: [id number]  Chief Complaint  Patient presents with   Urinary Tract Infection    HPI Patient is in today for suprapubic pain since Friday and also bad odor in the urine. She is convinced she has a bladder infection. In the past 3 months she has been treated with cipro which makes her fully better.   Past Medical History:  Diagnosis Date   Anemia    Arthritis    Bronchiectasis ([facility])    Chronic idiopathic constipation    Depression, major, recurrent, moderate ([facility])    Headache    Heart murmur    History of bladder infections    History of COVID-19    Hyperlipidemia    Hypertension    Hypothyroidism    Knee pain, bilateral    Osteoarthritis    Pneumonia    Primary insomnia    Recovering alcoholic ([facility])    SVT (supraventricular tachycardia) ([facility])    UTI (urinary tract infection)    Varicose veins    Vitamin B12 deficiency     Past Surgical History:  Procedure Laterality Date   ABDOMINAL HYSTERECTOMY  1991   ANGIOPLASTY     at least 15 years ago, pt. denies    APPENDECTOMY     AUGMENTATION MAMMAPLASTY Bilateral    BACK SURGERY     had 2 surgeries. Have rods placed in back    BARIATRIC SURGERY     lap band-at least 14 years ago   BREAST BIOPSY Left    x2   BREAST ENHANCEMENT SURGERY  2006   CHOLECYSTECTOMY  12/2019   removed lap band.    COLONOSCOPY  12/01/2016   Moderate predominantly sigmoid diverticulosis.    EYE SURGERY     IOL- bilateral - [facility]   KNEE ARTHROSCOPY Left    lap band surgery  1981   LUMBAR FUSION  07/29/2015   posterior level one   removal of cervical disc fragments  10/2007   pt. denies     Family History  Problem Relation Age of Onset   Heart disease Mother    Heart disease Father    Heart disease Brother    Diabetes Sister    Heart disease Brother    Heart disease Brother     Heart disease Brother    Heart disease Brother    Other Sister        BRAIN TUMOR    Social History   Socioeconomic History   Marital status: Married    Spouse name: Not on file   Number of children: 2   Years of education: Not on file   Highest education level: Not on file  Occupational History   Occupation: retired    Comment: Marine scientist  Tobacco Use   Smoking status: Never Smoker   Smokeless tobacco: Never Used  Scientific laboratory technician Use: Never used  Substance and Sexual Activity   Alcohol use: No    Alcohol/week: 0.0 standard drinks    Comment: recovery x 20 yrs   Drug use: No   Sexual activity: Not Currently    Comment: MARRIED  Other Topics Concern   Not on file  Social History Narrative   Not on file   Social Determinants of Health   Financial Resource Strain: Not on file  Food Insecurity: Not on file  Transportation Needs: Not on file  Physical Activity: Not on file  Stress: Not on file  Social Connections: Not on file  Intimate Partner Violence: Not on file    Outpatient Medications Prior to Visit  Medication Sig Dispense Refill   aspirin EC 81 MG tablet Take 81 mg by mouth daily.     dicyclomine (BENTYL) 20 MG tablet Take 1 tablet (20 mg total) by mouth 4 (four) times daily -  before meals and at bedtime. 120 tablet 1   docusate sodium (COLACE) 100 MG capsule Take 1 capsule (100 mg total) by mouth 2 (two) times daily. 60 capsule 0   estradiol (ESTRACE) 0.1 MG/GM vaginal cream at bedtime.     fluticasone (FLONASE) 50 MCG/ACT nasal spray Place 2 sprays into both nostrils daily as needed for allergies.      furosemide (LASIX) 20 MG tablet Take 1 tablet (20 mg total) by mouth daily. Call for fasting appointment. No further med refills until seen. 30 tablet 0   gabapentin (NEURONTIN) 600 MG tablet Take 1 tablet (600 mg total) by mouth 3 (three) times daily. 90 tablet 3   HYDROcodone-acetaminophen (NORCO) 10-325 MG tablet Take by mouth.      levothyroxine (SYNTHROID) 75 MCG tablet TAKE 1 TABLET(75 MCG) BY MOUTH DAILY 90 tablet 3   LORazepam (ATIVAN) 0.5 MG tablet TAKE 1 TABLET BY MOUTH EVERY DAY AS NEEDED FOR SEVERE ANXIETY 30 tablet 3   metoprolol tartrate (LOPRESSOR) 50 MG tablet TAKE 1 TABLET(50 MG) BY MOUTH TWICE DAILY WITH MEALS 180 tablet 1   montelukast (SINGULAIR) 10 MG tablet Take 1 tablet (10 mg total) by mouth at bedtime. 30 tablet 3   nitroGLYCERIN (NITROSTAT) 0.4 MG SL tablet Place 0.4 mg under the tongue every 5 (five) minutes as needed for chest pain.   0   omeprazole (PRILOSEC) 40 MG capsule Take 1 capsule (40 mg total) by mouth 2 (two) times daily. 60 capsule 30   potassium chloride SA (K-DUR,KLOR-CON) 20 MEQ tablet Take 20 mEq by mouth daily.     prednisoLONE acetate (PRED FORTE) 1 % ophthalmic suspension Place 1 drop into the left eye 3 (three) times daily.     Respiratory Therapy Supplies (FLUTTER) DEVI Use as directed. 1 each 0   tiZANidine (ZANAFLEX) 4 MG tablet      tretinoin (RETIN-A) 0.05 % cream tretinoin 0.05 % topical cream     zolpidem (AMBIEN) 5 MG tablet TAKE 1 TABLET(5 MG) BY MOUTH EVERY DAY AT BEDTIME 30 tablet 2   ciprofloxacin (CIPRO) 500 MG tablet Take 1 tablet (500 mg total) by mouth 2 (two) times daily. 14 tablet 0   No facility-administered medications prior to visit.    Allergies  Allergen Reactions   Levofloxacin Hives, Shortness Of Breath and Swelling    RASH IN MOUTH   (can take IV route)   Bactrim [Sulfamethoxazole-Trimethoprim] Hives and Itching   Clarithromycin     GI upset   Lyrica [Pregabalin]    Macrobid [Nitrofurantoin]     Not tolerate   Statins Other (See Comments)    Myalgias    Review of Systems  Constitutional: Negative for fatigue and fever.  Respiratory: Negative for apnea and cough.   Cardiovascular: Negative for chest pain and palpitations.  Gastrointestinal: Negative for abdominal pain, nausea and vomiting.  Genitourinary: Positive for  hematuria and urgency. Negative for dysuria.       Objective:    Physical Exam Vitals reviewed.  Constitutional:      Appearance: Normal appearance. She is  normal weight.  Neck:     Vascular: No carotid bruit.  Cardiovascular:     Rate and Rhythm: Normal rate and regular rhythm.     Pulses: Normal pulses.     Heart sounds: Normal heart sounds.  Pulmonary:     Effort: Pulmonary effort is normal. No respiratory distress.     Breath sounds: Normal breath sounds.  Abdominal:     General: Abdomen is flat. Bowel sounds are normal.     Palpations: Abdomen is soft.     Tenderness: There is no abdominal tenderness.  Neurological:     Mental Status: She is alert and oriented to person, place, and time.  Psychiatric:        Mood and Affect: Mood normal.        Behavior: Behavior normal.     BP 138/70    Pulse 69    Temp 97.7 F (36.5 C)    Resp 16    Ht 5' 5.5" (1.664 m)    Wt 182 lb (82.6 kg)    SpO2 97%    BMI 29.83 kg/m  Wt Readings from Last 3 Encounters:  06/08/20 182 lb (82.6 kg)  05/11/20 180 lb 3.2 oz (81.7 kg)  02/27/20 169 lb (76.7 kg)    Health Maintenance Due  Topic Date Due   TETANUS/TDAP  Never done   PNA vac Low Risk Adult (1 of 2 - PCV13) Never done   COVID-19 Vaccine (2 - Moderna 3-dose booster series) 09/09/2019   INFLUENZA VACCINE  01/19/2020   MAMMOGRAM  02/27/2020    There are no preventive care reminders to display for this patient.   Lab Results  Component Value Date   TSH 3.970 10/02/2019   Lab Results  Component Value Date   WBC 10.8 02/17/2020   HGB 12.0 02/17/2020   HCT 36.3 02/17/2020   MCV 89 02/17/2020   PLT 255 02/17/2020   Lab Results  Component Value Date   NA 136 02/17/2020   K 4.4 02/17/2020   CO2 29 02/17/2020   GLUCOSE 87 02/17/2020   BUN 18 02/17/2020   CREATININE 0.87 02/17/2020   BILITOT 0.4 02/17/2020   ALKPHOS 120 02/17/2020   AST 24 02/17/2020   ALT 25 02/17/2020   PROT 6.9 02/17/2020   ALBUMIN 4.3  02/17/2020   CALCIUM 9.7 02/17/2020   ANIONGAP 9 09/04/2018   Lab Results  Component Value Date   CHOL 164 10/02/2019   Lab Results  Component Value Date   HDL 43 10/02/2019   Lab Results  Component Value Date   LDLCALC 97 10/02/2019   Lab Results  Component Value Date   TRIG 138 10/02/2019   Lab Results  Component Value Date   CHOLHDL 3.8 10/02/2019   No results found for: HGBA1C     Assessment & Plan:  1. Suprapubic abdominal pain - POCT URINALYSIS DIP (CLINITEK) - urinalysis normal.  2. Bacterial vaginosis KOH negative.  WET - bacteria. Clue cells Treat with metronidazole 500 mg one three times a day x 1 week.   Meds ordered this encounter  Medications   furosemide (LASIX) 20 MG tablet    Sig: Take 1 tablet (20 mg total) by mouth daily.    Dispense:  30 tablet    Refill:  3   potassium chloride SA (KLOR-CON) 20 MEQ tablet    Sig: Take 1 tablet (20 mEq total) by mouth daily.    Dispense:  30 tablet    Refill:  3   metroNIDAZOLE (FLAGYL) 500 MG tablet    Sig: Take 1 tablet (500 mg total) by mouth 3 (three) times daily.    Dispense:  21 tablet    Refill:  0    Orders Placed This Encounter  Procedures   POCT URINALYSIS DIP (CLINITEK)    Follow-up: Return in about 4 weeks (around 07/06/2020).  An After Visit Summary was printed and given to the patient.  [name], MD [facility] [phone]

## 2020-06-14 ENCOUNTER — Encounter: Payer: Self-pay | Admitting: Family Medicine

## 2020-06-14 LAB — POCT WET PREP (WET MOUNT)
Trichomonas Wet Prep HPF POC: ABSENT
WBC, Wet Prep HPF POC: 3

## 2020-06-14 LAB — POCT ORAL KOH: KOH Prep POC: NEGATIVE

## 2020-06-18 DIAGNOSIS — M5137 Other intervertebral disc degeneration, lumbosacral region: Secondary | ICD-10-CM | POA: Diagnosis not present

## 2020-06-18 DIAGNOSIS — M961 Postlaminectomy syndrome, not elsewhere classified: Secondary | ICD-10-CM | POA: Diagnosis not present

## 2020-06-18 DIAGNOSIS — G894 Chronic pain syndrome: Secondary | ICD-10-CM | POA: Diagnosis not present

## 2020-06-24 ENCOUNTER — Other Ambulatory Visit: Payer: Self-pay

## 2020-06-24 MED ORDER — POTASSIUM CHLORIDE CRYS ER 20 MEQ PO TBCR: 20.0000 meq | EXTENDED_RELEASE_TABLET | Freq: Every day | ORAL | 3 refills | Status: DC

## 2020-06-30 ENCOUNTER — Telehealth: Payer: Self-pay | Admitting: Family Medicine

## 2020-06-30 NOTE — Progress Notes (Signed)
°  Chronic Care Management   Outreach Note  06/30/2020 Name: Courtney [name] MRN: [id number] DOB: [birth year]  Referred by: [name], MD Reason for referral : Chronic Care Management   An unsuccessful telephone outreach was attempted today. The patient was referred to the pharmacist for assistance with care management and care coordination.   Follow Up Plan:   Hilario Quarry  Upstream Scheduler

## 2020-07-09 ENCOUNTER — Encounter: Payer: Self-pay | Admitting: Family Medicine

## 2020-07-09 ENCOUNTER — Telehealth (INDEPENDENT_AMBULATORY_CARE_PROVIDER_SITE_OTHER): Payer: Medicare Other | Admitting: Family Medicine

## 2020-07-09 VITALS — BP 110/84 | HR 78 | Temp 101.0°F | Ht 65.0 in | Wt 170.0 lb

## 2020-07-09 DIAGNOSIS — J020 Streptococcal pharyngitis: Secondary | ICD-10-CM | POA: Diagnosis not present

## 2020-07-09 MED ORDER — AMOXICILLIN-POT CLAVULANATE 875-125 MG PO TABS: 1.0000 | ORAL_TABLET | Freq: Two times a day (BID) | ORAL | 0 refills | Status: DC

## 2020-07-09 MED ORDER — BENZONATATE 200 MG PO CAPS: 200.0000 mg | ORAL_CAPSULE | Freq: Three times a day (TID) | ORAL | 0 refills | Status: DC | PRN

## 2020-07-09 NOTE — Progress Notes (Signed)
Virtual Visit via Telephone Note   This visit type was conducted due to national recommendations for restrictions regarding the COVID-19 Pandemic (e.g. social distancing) in an effort to limit this patient's exposure and mitigate transmission in our community.  Due to her co-morbid illnesses, this patient is at least at moderate risk for complications without adequate follow up.  This format is felt to be most appropriate for this patient at this time.  The patient did not have access to video technology/had technical difficulties with video requiring transitioning to audio format only (telephone).  All issues noted in this document were discussed and addressed.  No physical exam could be performed with this format.  Patient verbally consented to a telehealth visit.   Date:  07/09/2020   ID:  Courtney [name], DOB [birth year], MRN [id number]  Patient Location: Home Provider Location: Office/Clinic  PCP:  [name], MD   Evaluation Performed: acute  Chief Complaint:  Sore throat  History of Present Illness:    Courtney [name] is a 75 y.o. female with URI since yesterday. Has been taking mucinex, cough medication. States she drank after her daughter who was positive for strep. C/o that her throat is very sore. Has had two home covid tests that were negative. Her throat is incredibly sore. Fever to 101.  The patient does have symptoms concerning for COVID-19 infection (fever, chills, cough, or new shortness of breath).    Past Medical History:  Diagnosis Date  . Anemia   . Arthritis   . Bronchiectasis ([place])   . Chronic idiopathic constipation   . Depression, major, recurrent, moderate ([place])   . Headache   . Heart murmur   . History of bladder infections   . History of COVID-19   . Hyperlipidemia   . Hypertension   . Hypothyroidism   . Knee pain, bilateral   . Osteoarthritis   . Pneumonia   . Primary insomnia   . Recovering alcoholic ([place])   . SVT (supraventricular tachycardia)  ([place])   . UTI (urinary tract infection)   . Varicose veins   . Vitamin B12 deficiency     Past Surgical History:  Procedure Laterality Date  . ABDOMINAL HYSTERECTOMY  1991  . ANGIOPLASTY     at least 15 years ago, pt. denies   . APPENDECTOMY    . AUGMENTATION MAMMAPLASTY Bilateral   . BACK SURGERY     had 2 surgeries. Have rods placed in back   . BARIATRIC SURGERY     lap band-at least 14 years ago  . BREAST BIOPSY Left    x2  . BREAST ENHANCEMENT SURGERY  2006  . CHOLECYSTECTOMY  12/2019   removed lap band.   . COLONOSCOPY  12/01/2016   Moderate predominantly sigmoid diverticulosis.   [name] EYE SURGERY     IOL- bilateral - [place]  . KNEE ARTHROSCOPY Left   . lap band surgery  1981  . LUMBAR FUSION  07/29/2015   posterior level one  . removal of cervical disc fragments  10/2007   pt. denies     Family History  Problem Relation Age of Onset  . Heart disease Mother   . Heart disease Father   . Heart disease Brother   . Diabetes Sister   . Heart disease Brother   . Heart disease Brother   . Heart disease Brother   . Heart disease Brother   . Other Sister        BRAIN TUMOR  Social History   Socioeconomic History  . Marital status: Married    Spouse name: Not on file  . Number of children: 2  . Years of education: Not on file  . Highest education level: Not on file  Occupational History  . Occupation: retired    Comment: Marine scientist  Tobacco Use  . Smoking status: Never Smoker  . Smokeless tobacco: Never Used  Vaping Use  . Vaping Use: Never used  Substance and Sexual Activity  . 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 Drug use: No  . Sexual activity: Not Currently    Comment: MARRIED  Other Topics Concern  . Not on file  Social History Narrative  . Not on file   Social Determinants of Health   Financial Resource Strain: Not on file  Food Insecurity: Not on file  Transportation Needs: Not on file   Physical Activity: Not on file  Stress: Not on file  Social Connections: Not on file  Intimate Partner Violence: Not on file    Outpatient Medications Prior to Visit  Medication Sig Dispense Refill  . aspirin EC 81 MG tablet Take 81 mg by mouth daily.    [name] dicyclomine (BENTYL) 20 MG tablet Take 1 tablet (20 mg total) by mouth 4 (four) times daily -  before meals and at bedtime. 120 tablet 1  . docusate sodium (COLACE) 100 MG capsule Take 1 capsule (100 mg total) by mouth 2 (two) times daily. 60 capsule 0  . estradiol (ESTRACE) 0.1 MG/GM vaginal cream at bedtime.    . fluticasone (FLONASE) 50 MCG/ACT nasal spray Place 2 sprays into both nostrils daily as needed for allergies.     . furosemide (LASIX) 20 MG tablet Take 1 tablet (20 mg total) by mouth daily. Call for fasting appointment. No further med refills until seen. 30 tablet 0  . furosemide (LASIX) 20 MG tablet Take 1 tablet (20 mg total) by mouth daily. 30 tablet 3  . gabapentin (NEURONTIN) 600 MG tablet Take 1 tablet (600 mg total) by mouth 3 (three) times daily. 90 tablet 3  . levothyroxine (SYNTHROID) 75 MCG tablet TAKE 1 TABLET(75 MCG) BY MOUTH DAILY 90 tablet 3  . LORazepam (ATIVAN) 0.5 MG tablet TAKE 1 TABLET BY MOUTH EVERY DAY AS NEEDED FOR SEVERE ANXIETY 30 tablet 3  . metoprolol tartrate (LOPRESSOR) 50 MG tablet TAKE 1 TABLET(50 MG) BY MOUTH TWICE DAILY WITH MEALS 180 tablet 1  . montelukast (SINGULAIR) 10 MG tablet Take 1 tablet (10 mg total) by mouth at bedtime. 30 tablet 3  . nitroGLYCERIN (NITROSTAT) 0.4 MG SL tablet Place 0.4 mg under the tongue every 5 (five) minutes as needed for chest pain.   0  . omeprazole (PRILOSEC) 40 MG capsule Take 1 capsule (40 mg total) by mouth 2 (two) times daily. 60 capsule 30  . potassium chloride SA (K-DUR,KLOR-CON) 20 MEQ tablet Take 20 mEq by mouth daily.    . potassium chloride SA (KLOR-CON) 20 MEQ tablet Take 1 tablet (20 mEq total) by mouth daily. 30 tablet 3  . prednisoLONE acetate  (PRED FORTE) 1 % ophthalmic suspension Place 1 drop into the left eye 3 (three) times daily.    [name] Respiratory Therapy Supplies (FLUTTER) DEVI Use as directed. 1 each 0  . tiZANidine (ZANAFLEX) 4 MG tablet     . tretinoin (RETIN-A) 0.05 % cream tretinoin 0.05 % topical cream    .  zolpidem (AMBIEN) 5 MG tablet TAKE 1 TABLET(5 MG) BY MOUTH EVERY DAY AT BEDTIME 30 tablet 2  . metroNIDAZOLE (FLAGYL) 500 MG tablet Take 1 tablet (500 mg total) by mouth 3 (three) times daily. 21 tablet 0   No facility-administered medications prior to visit.    Allergies:   Levofloxacin, Bactrim [sulfamethoxazole-trimethoprim], Clarithromycin, Lyrica [pregabalin], Macrobid [nitrofurantoin], and Statins   Social History   Tobacco Use  . Smoking status: Never Smoker  . Smokeless tobacco: Never Used  Vaping Use  . Vaping Use: Never used  Substance Use Topics  . 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 Drug use: No     Review of Systems  Constitutional: Positive for chills, fever and malaise/fatigue.  HENT: Positive for congestion and sore throat.   Respiratory: Positive for cough.   Cardiovascular: Negative for chest pain.  Gastrointestinal: Positive for nausea. Negative for vomiting.  Musculoskeletal: Positive for joint pain (baseline) and myalgias (baseline).  Neurological: Positive for headaches. Negative for dizziness.     Labs/Other Tests and Data Reviewed:    Recent Labs: 10/02/2019: TSH 3.970 02/17/2020: ALT 25; BUN 18; Creatinine, Ser 0.87; Hemoglobin 12.0; Platelets 255; Potassium 4.4; Sodium 136   Recent Lipid Panel Lab Results  Component Value Date/Time   CHOL 164 10/02/2019 09:22 AM   TRIG 138 10/02/2019 09:22 AM   HDL 43 10/02/2019 09:22 AM   CHOLHDL 3.8 10/02/2019 09:22 AM   CHOLHDL 4.2 09/20/2009 11:00 PM   LDLCALC 97 10/02/2019 09:22 AM    Wt Readings from Last 3 Encounters:  07/09/20 170 lb (77.1 kg)  06/08/20 182 lb (82.6 kg)   05/11/20 180 lb 3.2 oz (81.7 kg)     Objective:    Vital Signs:  BP 110/84   Pulse 78   Temp (!) 101 F (38.3 C)   Ht 5\' 5"  (1.651 m)   Wt 170 lb (77.1 kg)   SpO2 94%   BMI 28.29 kg/m    Physical Exam   ASSESSMENT & PLAN:   1. Strep pharyngitis - amoxicillin-clavulanate (AUGMENTIN) 875-125 MG tablet; Take 1 tablet by mouth 2 (two) times daily.  Dispense: 20 tablet; Refill: 0 - benzonatate (TESSALON) 200 MG capsule; Take 1 capsule (200 mg total) by mouth 3 (three) times daily as needed for cough.  Dispense: 30 capsule; Refill: 0   Meds ordered this encounter  Medications  . amoxicillin-clavulanate (AUGMENTIN) 875-125 MG tablet    Sig: Take 1 tablet by mouth 2 (two) times daily.    Dispense:  20 tablet    Refill:  0  . benzonatate (TESSALON) 200 MG capsule    Sig: Take 1 capsule (200 mg total) by mouth 3 (three) times daily as needed for cough.    Dispense:  30 capsule    Refill:  0    COVID-19 Education: The signs and symptoms of COVID-19 were discussed with the patient and how to seek care for testing (follow up with PCP or arrange E-visit). The importance of social distancing was discussed today.   I spent 10 minutes dedicated to the care of this patient on the date of this encounter. Follow Up:  Virtual Visit  prn  Signed,  [name], MD  07/09/2020 2:40 PM    [place]

## 2020-07-16 ENCOUNTER — Telehealth: Payer: Self-pay | Admitting: Family Medicine

## 2020-07-16 NOTE — Progress Notes (Signed)
  Chronic Care Management   Outreach Note  07/16/2020 Name: Courtney [name] MRN: [id number] DOB: [birth year]  Referred by: [name], MD Reason for referral : Chronic Care Management   A second unsuccessful telephone outreach was attempted today. The patient was referred to pharmacist for assistance with care management and care coordination.  Follow Up Plan:   Hilario Quarry  [facility] Scheduler

## 2020-07-22 ENCOUNTER — Telehealth: Payer: Self-pay | Admitting: Family Medicine

## 2020-07-22 NOTE — Progress Notes (Signed)
°  Chronic Care Management   Outreach Note  07/22/2020 Name: Courtney [name] MRN: [id number] DOB: [birth year]  Referred by: [name], MD Reason for referral : Chronic Care Management   Third unsuccessful telephone outreach was attempted today. The patient was referred to the pharmacist for assistance with care management and care coordination.   Follow Up Plan:   Courtney [name]  Upstream Scheduler

## 2020-09-02 ENCOUNTER — Other Ambulatory Visit: Payer: Self-pay

## 2020-09-02 ENCOUNTER — Ambulatory Visit (INDEPENDENT_AMBULATORY_CARE_PROVIDER_SITE_OTHER): Payer: Medicare Other | Admitting: Nurse Practitioner

## 2020-09-02 ENCOUNTER — Encounter: Payer: Self-pay | Admitting: Nurse Practitioner

## 2020-09-02 VITALS — BP 142/72 | HR 74 | Temp 97.4°F | Ht 65.0 in | Wt 185.0 lb

## 2020-09-02 DIAGNOSIS — R102 Pelvic and perineal pain: Secondary | ICD-10-CM

## 2020-09-02 DIAGNOSIS — R351 Nocturia: Secondary | ICD-10-CM | POA: Diagnosis not present

## 2020-09-02 DIAGNOSIS — M545 Low back pain, unspecified: Secondary | ICD-10-CM | POA: Diagnosis not present

## 2020-09-02 DIAGNOSIS — Z79899 Other long term (current) drug therapy: Secondary | ICD-10-CM | POA: Diagnosis not present

## 2020-09-02 DIAGNOSIS — Z8719 Personal history of other diseases of the digestive system: Secondary | ICD-10-CM

## 2020-09-02 DIAGNOSIS — R350 Frequency of micturition: Secondary | ICD-10-CM | POA: Diagnosis not present

## 2020-09-02 DIAGNOSIS — R109 Unspecified abdominal pain: Secondary | ICD-10-CM | POA: Diagnosis not present

## 2020-09-02 DIAGNOSIS — R1032 Left lower quadrant pain: Secondary | ICD-10-CM

## 2020-09-02 DIAGNOSIS — M533 Sacrococcygeal disorders, not elsewhere classified: Secondary | ICD-10-CM | POA: Diagnosis not present

## 2020-09-02 LAB — POCT URINALYSIS DIPSTICK
Bilirubin, UA: NEGATIVE
Blood, UA: NEGATIVE
Glucose, UA: NEGATIVE
Ketones, UA: NEGATIVE
Leukocytes, UA: NEGATIVE
Nitrite, UA: NEGATIVE
Protein, UA: NEGATIVE
Spec Grav, UA: 1.02 (ref 1.010–1.025)
Urobilinogen, UA: NEGATIVE E.U./dL — AB
pH, UA: 6 (ref 5.0–8.0)

## 2020-09-02 MED ORDER — CIPROFLOXACIN HCL 500 MG PO TABS: 500.0000 mg | ORAL_TABLET | Freq: Two times a day (BID) | ORAL | 0 refills | Status: DC

## 2020-09-02 NOTE — Patient Instructions (Signed)
Begin Cipro 500 mg twice daily Obtain x-ray at [facility]  We will lab and x-ray results Notify office if symptoms worsen or fail to improve   Acute Back Pain, Adult Acute back pain is sudden and usually short-lived. It is often caused by an injury to the muscles and tissues in the back. The injury may result from:  A muscle or ligament getting overstretched or torn (strained). Ligaments are tissues that connect bones to each other. Lifting something improperly can cause a back strain.  Wear and tear (degeneration) of the spinal disks. Spinal disks are circular tissue that provide cushioning between the bones of the spine (vertebrae).  Twisting motions, such as while playing sports or doing yard work.  A hit to the back.  Arthritis. You may have a physical exam, lab tests, and imaging tests to find the cause of your pain. Acute back pain usually goes away with rest and home care. Follow these instructions at home: Managing pain, stiffness, and swelling  Treatment may include medicines for pain and inflammation that are taken by mouth or applied to the skin, prescription pain medicine, or muscle relaxants. Take over-the-counter and prescription medicines only as told by your health care provider.  Your health care provider may recommend applying ice during the first 24-48 hours after your pain starts. To do this: ? Put ice in a plastic bag. ? Place a towel between your skin and the bag. ? Leave the ice on for 20 minutes, 2-3 times a day.  If directed, apply heat to the affected area as often as told by your health care provider. Use the heat source that your health care provider recommends, such as a moist heat pack or a heating pad. ? Place a towel between your skin and the heat source. ? Leave the heat on for 20-30 minutes. ? Remove the heat if your skin turns bright red. This is especially important if you are unable to feel pain, heat, or cold. You have a greater risk of  getting burned. Activity  Do not stay in bed. Staying in bed for more than 1-2 days can delay your recovery.  Sit up and stand up straight. Avoid leaning forward when you sit or hunching over when you stand. ? If you work at a desk, sit close to it so you do not need to lean over. Keep your chin tucked in. Keep your neck drawn back, and keep your elbows bent at a 90-degree angle (right angle). ? Sit high and close to the steering wheel when you drive. Add lower back (lumbar) support to your car seat, if needed.  Take short walks on even surfaces as soon as you are able. Try to increase the length of time you walk each day.  Do not sit, drive, or stand in one place for more than 30 minutes at a time. Sitting or standing for long periods of time can put stress on your back.  Do not drive or use heavy machinery while taking prescription pain medicine.  Use proper lifting techniques. When you bend and lift, use positions that put less stress on your back: ? [place] your knees. ? Keep the load close to your body. ? Avoid twisting.  Exercise regularly as told by your health care provider. Exercising helps your back heal faster and helps prevent back injuries by keeping muscles strong and flexible.  Work with a physical therapist to make a safe exercise program, as recommended by your health care provider. Do  any exercises as told by your physical therapist.   Lifestyle  Maintain a healthy weight. Extra weight puts stress on your back and makes it difficult to have good posture.  Avoid activities or situations that make you feel anxious or stressed. Stress and anxiety increase muscle tension and can make back pain worse. Learn ways to manage anxiety and stress, such as through exercise. General instructions  Sleep on a firm mattress in a comfortable position. Try lying on your side with your knees slightly bent. If you lie on your back, put a pillow under your knees.  Follow your treatment plan  as told by your health care provider. This may include: ? Cognitive or behavioral therapy. ? Acupuncture or massage therapy. ? Meditation or yoga. Contact a health care provider if:  You have pain that is not relieved with rest or medicine.  You have increasing pain going down into your legs or buttocks.  Your pain does not improve after 2 weeks.  You have pain at night.  You lose weight without trying.  You have a fever or chills. Get help right away if:  You develop new bowel or bladder control problems.  You have unusual weakness or numbness in your arms or legs.  You develop nausea or vomiting.  You develop abdominal pain.  You feel faint. Summary  Acute back pain is sudden and usually short-lived.  Use proper lifting techniques. When you bend and lift, use positions that put less stress on your back.  Take over-the-counter and prescription medicines and apply heat or ice as directed by your health care provider. This information is not intended to replace advice given to you by your health care provider. Make sure you discuss any questions you have with your health care provider. Document Revised: 02/28/2020 Document Reviewed: 02/28/2020 Elsevier Patient Education  2021 [place]. Ciprofloxacin tablets What is this medicine? CIPROFLOXACIN (sip roe FLOX a sin) is a quinolone antibiotic. It is used to treat certain kinds of bacterial infections. It will not work for colds, flu, or other viral infections. This medicine may be used for other purposes; ask your health care provider or pharmacist if you have questions. COMMON BRAND NAME(S): Cipro What should I tell my health care provider before I take this medicine? They need to know if you have any of these conditions:  bone problems  diabetes  heart disease  high blood pressure  history of irregular heartbeat  history of low levels of potassium in the blood  joint problems  kidney disease  liver  disease  mental illness  myasthenia gravis  seizures  tendon problems  tingling of the fingers or toes, or other nerve disorder  an unusual or allergic reaction to ciprofloxacin, other antibiotics or medicines, foods, dyes, or preservatives  pregnant or trying to get pregnant  breast-feeding How should I use this medicine? Take this medicine by mouth with a full glass of water. Follow the directions on the prescription label. You can take it with or without food. If it upsets your stomach, take it with food. Take your medicine at regular intervals. Do not take your medicine more often than directed. Take all of your medicine as directed even if you think you are better. Do not skip doses or stop your medicine early. Avoid antacids, aluminum, calcium, iron, magnesium, and zinc products for 6 hours before and 2 hours after taking a dose of this medicine. A special MedGuide will be given to you by the pharmacist with  each prescription and refill. Be sure to read this information carefully each time. Talk to your pediatrician regarding the use of this medicine in children. Special care may be needed. Overdosage: If you think you have taken too much of this medicine contact a poison control center or emergency room at once. NOTE: This medicine is only for you. Do not share this medicine with others. What if I miss a dose? If you miss a dose, take it as soon as you can. If it is almost time for your next dose, take only that dose. Do not take double or extra doses. What may interact with this medicine? Do not take this medicine with any of the following medications:  cisapride  dronedarone  flibanserin  lomitapide  pimozide  thioridazine  tizanidine This medicine may also interact with the following medications:  antacids  birth control pills  caffeine  certain medicines for diabetes, like glipizide, glyburide, or insulin  certain medicines that treat or prevent blood  clots like warfarin  clozapine  cyclosporine  didanosine buffered tablets or powder  dofetilide  duloxetine  lanthanum carbonate  lidocaine  methotrexate  multivitamins  NSAIDS, medicines for pain and inflammation, like ibuprofen or naproxen  olanzapine  omeprazole  other medicines that prolong the QT interval (cause an abnormal heart rhythm)  phenytoin  probenecid  ropinirole  sevelamer  sildenafil  sucralfate  theophylline  ziprasidone  zolpidem This list may not describe all possible interactions. Give your health care provider a list of all the medicines, herbs, non-prescription drugs, or dietary supplements you use. Also tell them if you smoke, drink alcohol, or use illegal drugs. Some items may interact with your medicine. What should I watch for while using this medicine? Tell your doctor or health care provider if your symptoms do not start to get better or if they get worse. This medicine may cause serious skin reactions. They can happen weeks to months after starting the medicine. Contact your health care provider right away if you notice fevers or flu-like symptoms with a rash. The rash may be red or purple and then turn into blisters or peeling of the skin. Or, you might notice a red rash with swelling of the face, lips or lymph nodes in your neck or under your arms. Do not treat diarrhea with over the counter products. Contact your doctor if you have diarrhea that lasts more than 2 days or if it is severe and watery. Check with your doctor or health care provider if you get an attack of severe diarrhea, nausea and vomiting, or if you sweat a lot. The loss of too much body fluid can make it dangerous for you to take this medicine. This medicine may increase blood sugar. Ask your health care provider if changes in diet or medicines are needed if you have diabetes. You may get drowsy or dizzy. Do not drive, use machinery, or do anything that needs mental  alertness until you know how this medicine affects you. Do not sit or stand up quickly, especially if you are an older patient. This reduces the risk of dizzy or fainting spells. This medicine can make you more sensitive to the sun. Keep out of the sun. If you cannot avoid being in the sun, wear protective clothing and use sunscreen. Do not use sun lamps or tanning beds/booths. What side effects may I notice from receiving this medicine? Side effects that you should report to your doctor or health care professional as soon as  possible:  allergic reactions like skin rash or hives, swelling of the face, lips, or tongue  anxious  bloody or watery diarrhea  confusion  depressed mood  fast, irregular heartbeat  fever  hallucination, loss of contact with reality  joint, muscle, or tendon pain or swelling  loss of memory  pain, tingling, numbness in the hands or feet  redness, blistering, peeling or loosening of the skin, including inside the mouth  seizures  signs and symptoms of aortic dissection such as sudden chest, stomach, or back pain  signs and symptoms of high blood sugar such as being more thirsty or hungry or having to urinate more than normal. You may also feel very tired or have blurry vision.  signs and symptoms of liver injury like dark yellow or brown urine; general ill feeling or flu-like symptoms; light-colored stools; loss of appetite; nausea; right upper belly pain; unusually weak or tired; yellowing of the eyes or skin  signs and symptoms of low blood sugar such as feeling anxious; confusion; dizziness; increased hunger; unusually weak or tired; sweating; shakiness; cold; irritable; headache; blurred vision; fast heartbeat; loss of consciousness; pale skin  suicidal thoughts or other mood changes  sunburn  unusually weak or tired Side effects that usually do not require medical attention (report to your doctor or health care professional if they continue or  are bothersome):  dry mouth  headache  nausea  trouble sleeping This list may not describe all possible side effects. Call your doctor for medical advice about side effects. You may report side effects to FDA at 1-800-FDA-1088. Where should I keep my medicine? Keep out of the reach of children. Store at room temperature below 30 degrees C (86 degrees F). Keep container tightly closed. Throw away any unused medicine after the expiration date. NOTE: This sheet is a summary. It may not cover all possible information. If you have questions about this medicine, talk to your doctor, pharmacist, or health care provider.  2021 Elsevier/Gold Standard (2018-09-06 11:26:08)

## 2020-09-02 NOTE — Progress Notes (Signed)
Acute Office Visit  Subjective:    Patient ID: Courtney [name], female    DOB: Apr 24, 1946, 75 y.o.   MRN: [id number]  Chief Complaint  Patient presents with  . Pelvic and back pain    HPI   Courtney [name] is a 75 year old female Caucasian female that is in today for pelvic and left lower back pain. Onset of symptoms was 4-days-ago.   Pt states she is experiencing nocturia and frequency. UA negative.   Pt states she has been under great deal of family stress. She is tearful and crying. States her spouse is undergoing hemodialysis three times weekly. She tells me he has a thrombosis in his dialysis AV fistula that is requiring thrombectomy today. States her husband told her he will not live much longer and has asked her to take him to [place] this weekend for one last vacation. She also notes that her 1 year old grandson is hospitalized with complications of [id number].    Back Pain  She reports new onset back pain. There was not an injury that may have caused the pain. The most recent episode started a few days ago and is staying constant. The pain is located in the left lumbar areawith radiation, without radiation to lower abdomen. It is described as aching, is moderate in intensity, occurring constantly.  Aggravating factors: walking Relieving factors: none.  She has tried NSAIDs with no relief.   Associated symptoms: Yes abdominal pain No bowel incontinence  No chest pain No dysuria   No fever No headaches  No joint pains Yes pelvic pain  No weakness in leg  No tingling in lower extremities  No urinary incontinence No weight loss    Past Medical History:  Diagnosis Date  . Anemia   . Arthritis   . Bronchiectasis ([place])   . Chronic idiopathic constipation   . Depression, major, recurrent, moderate ([place])   . Headache   . Heart murmur   . History of bladder infections   . History of COVID-19   . Hyperlipidemia   . Hypertension   . Hypothyroidism   . Knee pain, bilateral   .  Osteoarthritis   . Pneumonia   . Primary insomnia   . Recovering alcoholic ([place])   . SVT (supraventricular tachycardia) ([place])   . UTI (urinary tract infection)   . Varicose veins   . Vitamin B12 deficiency     Past Surgical History:  Procedure Laterality Date  . ABDOMINAL HYSTERECTOMY  1991  . ANGIOPLASTY     at least 15 years ago, pt. denies   . APPENDECTOMY    . AUGMENTATION MAMMAPLASTY Bilateral   . BACK SURGERY     had 2 surgeries. Have rods placed in back   . BARIATRIC SURGERY     lap band-at least 14 years ago  . BREAST BIOPSY Left    x2  . BREAST ENHANCEMENT SURGERY  2006  . CHOLECYSTECTOMY  12/2019   removed lap band.   . COLONOSCOPY  12/01/2016   Moderate predominantly sigmoid diverticulosis.   [name] EYE SURGERY     IOL- bilateral - [facility]  . KNEE ARTHROSCOPY Left   . lap band surgery  1981  . LUMBAR FUSION  07/29/2015   posterior level one  . removal of cervical disc fragments  10/2007   pt. denies     Family History  Problem Relation Age of Onset  . Heart disease Mother   . Heart disease Father   . Heart disease Brother   .  Diabetes Sister   . Heart disease Brother   . Heart disease Brother   . Heart disease Brother   . Heart disease Brother   . Other Sister        BRAIN TUMOR    Social History   Socioeconomic History  . Marital status: Married    Spouse name: Not on file  . Number of children: 2  . Years of education: Not on file  . Highest education level: Not on file  Occupational History  . Occupation: retired    Comment: Marine scientist  Tobacco Use  . Smoking status: Never Smoker  . Smokeless tobacco: Never Used  Vaping Use  . Vaping Use: Never used  Substance and Sexual Activity  . 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 Drug use: No  . Sexual activity: Not Currently    Comment: MARRIED  Other Topics Concern  . Not on file  Social History Narrative  . Not on file   Social Determinants  of Health   Financial Resource Strain: Not on file  Food Insecurity: Not on file  Transportation Needs: Not on file  Physical Activity: Not on file  Stress: Not on file  Social Connections: Not on file  Intimate Partner Violence: Not on file    Outpatient Medications Prior to Visit  Medication Sig Dispense Refill  . aspirin EC 81 MG tablet Take 81 mg by mouth daily.    [name] dicyclomine (BENTYL) 20 MG tablet Take 1 tablet (20 mg total) by mouth 4 (four) times daily -  before meals and at bedtime. 120 tablet 1  . docusate sodium (COLACE) 100 MG capsule Take 1 capsule (100 mg total) by mouth 2 (two) times daily. 60 capsule 0  . estradiol (ESTRACE) 0.1 MG/GM vaginal cream at bedtime.    . fluticasone (FLONASE) 50 MCG/ACT nasal spray Place 2 sprays into both nostrils daily as needed for allergies.     . furosemide (LASIX) 20 MG tablet Take 1 tablet (20 mg total) by mouth daily. 30 tablet 3  . gabapentin (NEURONTIN) 600 MG tablet Take 1 tablet (600 mg total) by mouth 3 (three) times daily. 90 tablet 3  . levothyroxine (SYNTHROID) 75 MCG tablet TAKE 1 TABLET(75 MCG) BY MOUTH DAILY 90 tablet 3  . LORazepam (ATIVAN) 0.5 MG tablet TAKE 1 TABLET BY MOUTH EVERY DAY AS NEEDED FOR SEVERE ANXIETY 30 tablet 3  . metoprolol tartrate (LOPRESSOR) 50 MG tablet TAKE 1 TABLET(50 MG) BY MOUTH TWICE DAILY WITH MEALS 180 tablet 1  . montelukast (SINGULAIR) 10 MG tablet Take 1 tablet (10 mg total) by mouth at bedtime. 30 tablet 3  . nitroGLYCERIN (NITROSTAT) 0.4 MG SL tablet Place 0.4 mg under the tongue every 5 (five) minutes as needed for chest pain.   0  . omeprazole (PRILOSEC) 40 MG capsule Take 1 capsule (40 mg total) by mouth 2 (two) times daily. 60 capsule 30  . potassium chloride SA (KLOR-CON) 20 MEQ tablet Take 1 tablet (20 mEq total) by mouth daily. 30 tablet 3  . prednisoLONE acetate (PRED FORTE) 1 % ophthalmic suspension Place 1 drop into the left eye 3 (three) times daily.    [name] Respiratory Therapy  Supplies (FLUTTER) DEVI Use as directed. 1 each 0  . tiZANidine (ZANAFLEX) 4 MG tablet     . tretinoin (RETIN-A) 0.05 % cream tretinoin 0.05 % topical cream    . zolpidem (AMBIEN) 5  MG tablet TAKE 1 TABLET(5 MG) BY MOUTH EVERY DAY AT BEDTIME 30 tablet 2  . amoxicillin-clavulanate (AUGMENTIN) 875-125 MG tablet Take 1 tablet by mouth 2 (two) times daily. 20 tablet 0  . benzonatate (TESSALON) 200 MG capsule Take 1 capsule (200 mg total) by mouth 3 (three) times daily as needed for cough. 30 capsule 0  . furosemide (LASIX) 20 MG tablet Take 1 tablet (20 mg total) by mouth daily. Call for fasting appointment. No further med refills until seen. 30 tablet 0  . potassium chloride SA (K-DUR,KLOR-CON) 20 MEQ tablet Take 20 mEq by mouth daily.     No facility-administered medications prior to visit.    Allergies  Allergen Reactions  . Levofloxacin Hives, Shortness Of Breath and Swelling    RASH IN MOUTH   (can take IV route)  . Bactrim [Sulfamethoxazole-Trimethoprim] Hives and Itching  . Clarithromycin     GI upset  . Lyrica [Pregabalin]   . Macrobid [Nitrofurantoin]     Not tolerate  . Statins Other (See Comments)    Myalgias    Review of Systems  Constitutional: Negative for fatigue and fever.  HENT: Negative.   Gastrointestinal: Positive for abdominal pain (lower abdomen). Negative for constipation, diarrhea, nausea, rectal pain and vomiting.  Endocrine: Negative for cold intolerance, heat intolerance, polydipsia, polyphagia and polyuria.  Genitourinary: Positive for frequency (nocturia) and pelvic pain. Negative for dysuria, flank pain and urgency.  Musculoskeletal: Positive for back pain (left lower back).  Skin: Negative.   Allergic/Immunologic: Negative.   Neurological: Negative.   Hematological: Negative.   Psychiatric/Behavioral: The patient is nervous/anxious.        Objective:    Physical Exam Vitals reviewed.  Constitutional:      Appearance: Normal appearance.  HENT:      Head: Normocephalic.  Cardiovascular:     Rate and Rhythm: Normal rate and regular rhythm.     Pulses: Normal pulses.     Heart sounds: Normal heart sounds.  Pulmonary:     Effort: Pulmonary effort is normal.     Breath sounds: Normal breath sounds.  Abdominal:     General: Bowel sounds are normal.     Palpations: Abdomen is soft.     Tenderness: There is abdominal tenderness (pelvic tenderness with palpation). There is no left CVA tenderness.  Musculoskeletal:        General: Tenderness present.  Skin:    General: Skin is warm.     Capillary Refill: Capillary refill takes less than 2 seconds.  Neurological:     General: No focal deficit present.     Mental Status: She is alert and oriented to person, place, and time.  Psychiatric:     Comments: Pt tearful and crying     BP (!) 142/72 (BP Location: Left Arm, Patient Position: Sitting)   Pulse 74   Temp (!) 97.4 F (36.3 C) (Temporal)   Ht 5\' 5"  (1.651 m)   Wt 185 lb (83.9 kg)   SpO2 98%   BMI 30.79 kg/m  Wt Readings from Last 3 Encounters:  09/02/20 185 lb (83.9 kg)  07/09/20 170 lb (77.1 kg)  06/08/20 182 lb (82.6 kg)    Health Maintenance Due  Topic Date Due  . TETANUS/TDAP  Never done  . COVID-19 Vaccine (3 - Booster) 02/09/2020  . MAMMOGRAM  02/27/2020      Lab Results  Component Value Date   TSH 3.970 10/02/2019   Lab Results  Component Value Date  WBC 10.8 02/17/2020   HGB 12.0 02/17/2020   HCT 36.3 02/17/2020   MCV 89 02/17/2020   PLT 255 02/17/2020   Lab Results  Component Value Date   NA 136 02/17/2020   K 4.4 02/17/2020   CO2 29 02/17/2020   GLUCOSE 87 02/17/2020   BUN 18 02/17/2020   CREATININE 0.87 02/17/2020   BILITOT 0.4 02/17/2020   ALKPHOS 120 02/17/2020   AST 24 02/17/2020   ALT 25 02/17/2020   PROT 6.9 02/17/2020   ALBUMIN 4.3 02/17/2020   CALCIUM 9.7 02/17/2020   ANIONGAP 9 09/04/2018   Lab Results  Component Value Date   CHOL 164 10/02/2019   Lab Results   Component Value Date   HDL 43 10/02/2019   Lab Results  Component Value Date   LDLCALC 97 10/02/2019   Lab Results  Component Value Date   TRIG 138 10/02/2019   Lab Results  Component Value Date   CHOLHDL 3.8 10/02/2019        Assessment & Plan:   1. Acute bilateral low back pain without sciatica - POCT urinalysis dipstick - DG Abd 1 View - CBC with Differential/Platelet - Comprehensive metabolic panel  2. Urinary frequency  3. Nocturia  4. History of diverticulosis - ciprofloxacin (CIPRO) 500 MG tablet; Take 1 tablet (500 mg total) by mouth 2 (two) times daily.  Dispense: 14 tablet; Refill: 0  5. Left lower quadrant abdominal pain  6. Pelvic pain - DG Pelvis 1-2 Views      Begin Cipro 500 mg twice daily Obtain x[facility]ray at [facility]  We will lab and x-ray results Notify office if symptoms worsen or fail to improve    Follow up: As needed if symptoms fail to improve or worsen  Signed, Rip Harbour, NP

## 2020-09-03 LAB — COMPREHENSIVE METABOLIC PANEL
ALT: 22 IU/L (ref 0–32)
AST: 22 IU/L (ref 0–40)
Albumin/Globulin Ratio: 2 (ref 1.2–2.2)
Albumin: 4.6 g/dL (ref 3.7–4.7)
Alkaline Phosphatase: 100 IU/L (ref 44–121)
BUN/Creatinine Ratio: 26 (ref 12–28)
BUN: 25 mg/dL (ref 8–27)
Bilirubin Total: 0.4 mg/dL (ref 0.0–1.2)
CO2: 21 mmol/L (ref 20–29)
Calcium: 10 mg/dL (ref 8.7–10.3)
Chloride: 95 mmol/L — ABNORMAL LOW (ref 96–106)
Creatinine, Ser: 0.96 mg/dL (ref 0.57–1.00)
Globulin, Total: 2.3 g/dL (ref 1.5–4.5)
Glucose: 104 mg/dL — ABNORMAL HIGH (ref 65–99)
Potassium: 4.6 mmol/L (ref 3.5–5.2)
Sodium: 132 mmol/L — ABNORMAL LOW (ref 134–144)
Total Protein: 6.9 g/dL (ref 6.0–8.5)
eGFR: 62 mL/min/{1.73_m2} (ref >59)

## 2020-09-03 LAB — CBC WITH DIFFERENTIAL/PLATELET
Basophils Absolute: 0.1 10*3/uL (ref 0.0–0.2)
Basos: 1 %
EOS (ABSOLUTE): 0.4 10*3/uL (ref 0.0–0.4)
Eos: 7 %
Hematocrit: 36.6 % (ref 34.0–46.6)
Hemoglobin: 12.2 g/dL (ref 11.1–15.9)
Immature Grans (Abs): 0 10*3/uL (ref 0.0–0.1)
Immature Granulocytes: 0 %
Lymphocytes Absolute: 2.2 10*3/uL (ref 0.7–3.1)
Lymphs: 35 %
MCH: 30.2 pg (ref 26.6–33.0)
MCHC: 33.3 g/dL (ref 31.5–35.7)
MCV: 91 fL (ref 79–97)
Monocytes Absolute: 0.5 10*3/uL (ref 0.1–0.9)
Monocytes: 8 %
Neutrophils Absolute: 3 10*3/uL (ref 1.4–7.0)
Neutrophils: 49 %
Platelets: 211 10*3/uL (ref 150–450)
RBC: 4.04 x10E6/uL (ref 3.77–5.28)
RDW: 12.5 % (ref 11.7–15.4)
WBC: 6.2 10*3/uL (ref 3.4–10.8)

## 2020-09-05 IMAGING — MG DIGITAL DIAGNOSTIC UNILATERAL LEFT MAMMOGRAM WITH IMPLANTS, CAD
8 series · 8 of 20 positions shown · non-contrast
Comparison: Previous exam(s).

CLINICAL DATA: 71-year-old patient with recent fairly diffuse left
breast pain. She denies any breast lumps. She has recently had left
chest pain and was diagnosed with pneumonia and feels like she is
getting better.

EXAM:
DIGITAL DIAGNOSTIC LEFT MAMMOGRAM WITH IMPLANTS, CAD AND TOMO
ULTRASOUND LEFT BREAST
The patient has retropectoral implants. Standard and implant
displaced views were performed.

[L CC]
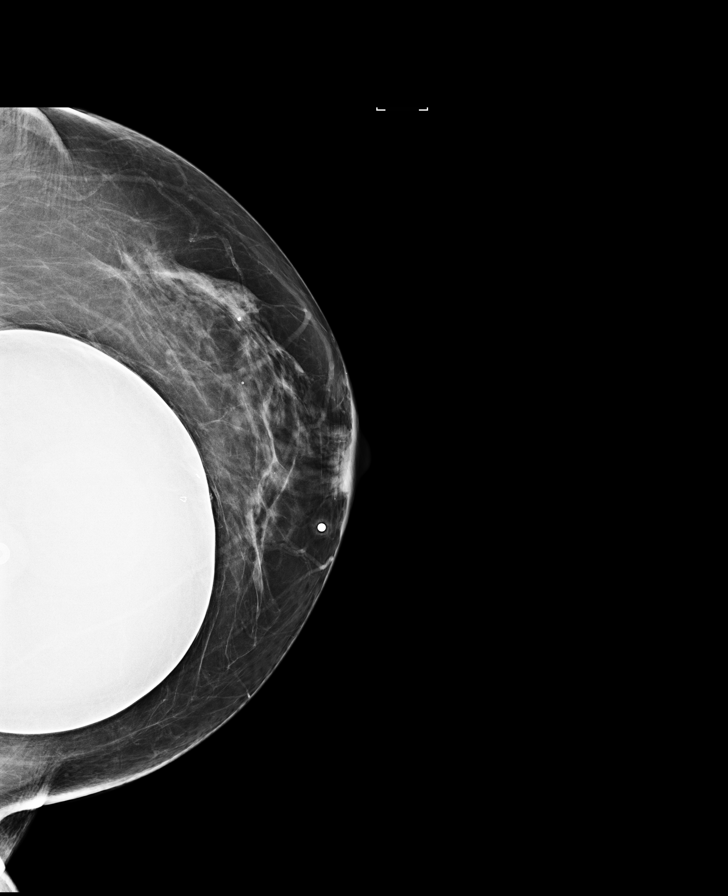

[L MLO]
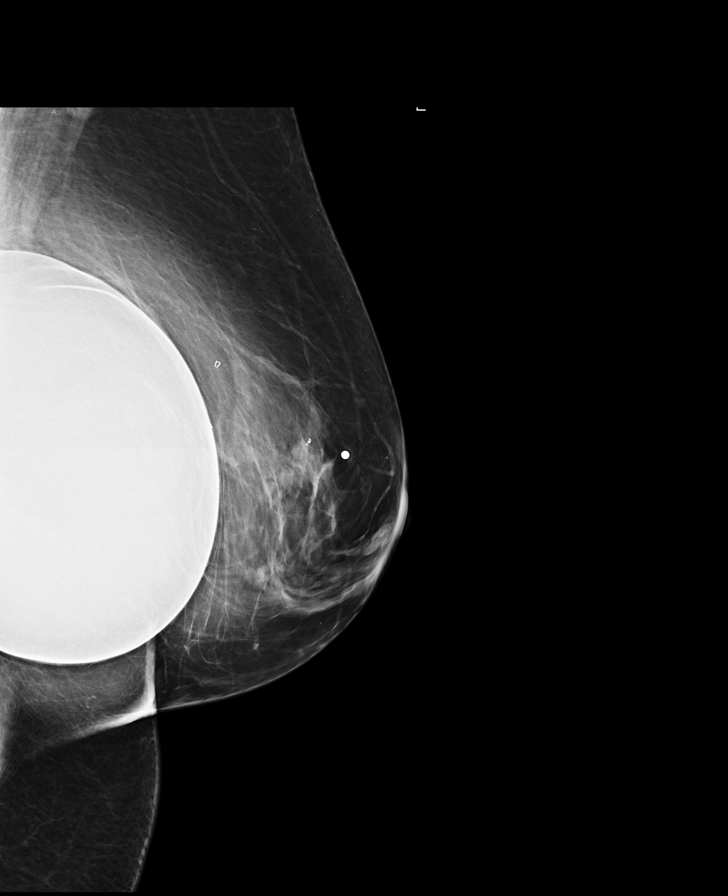

[L CC synth-2D]
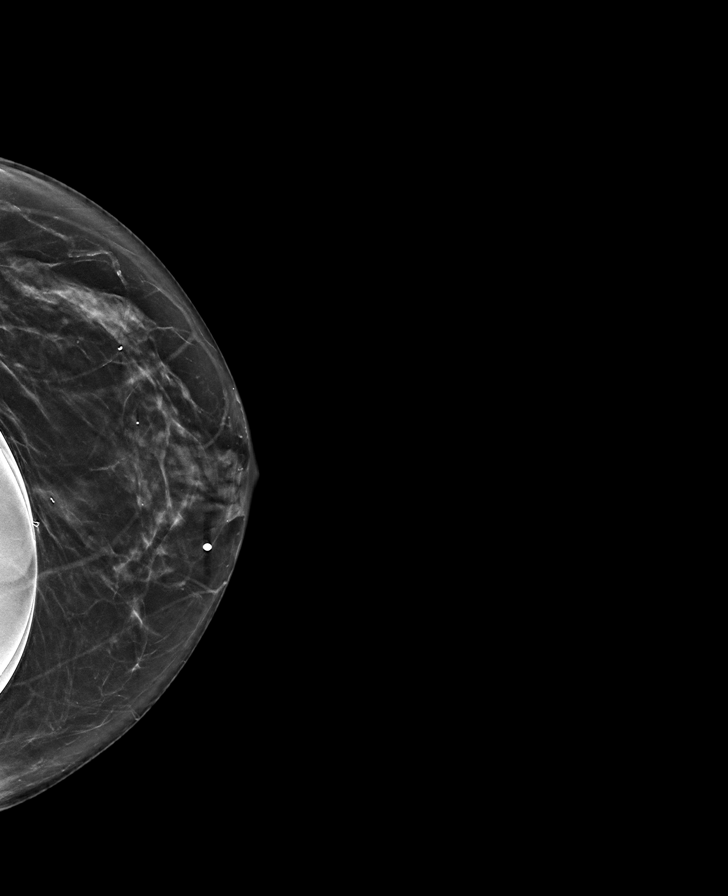

[L TAN synth-2D]
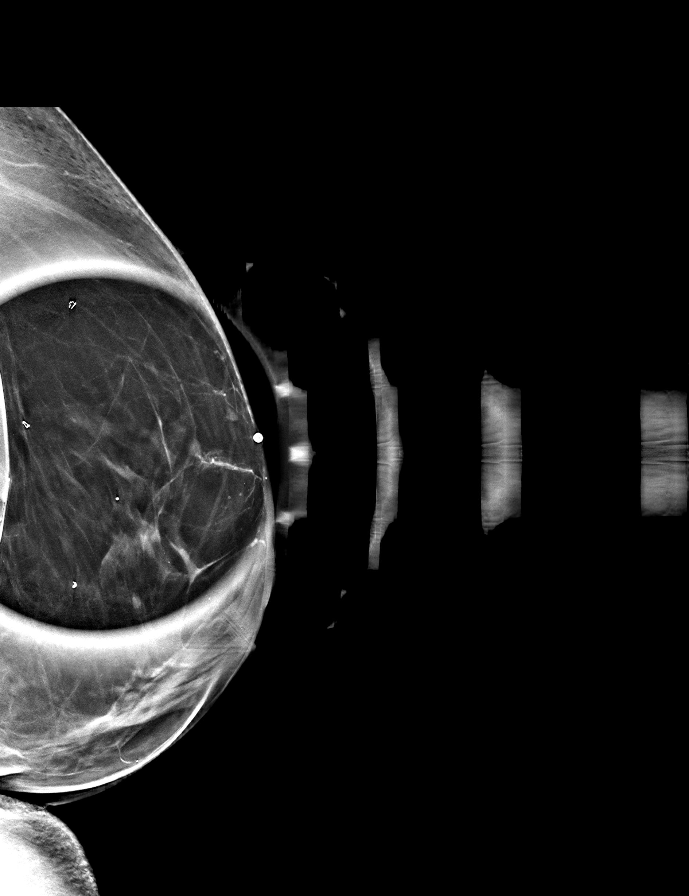

[L MLO synth-2D]
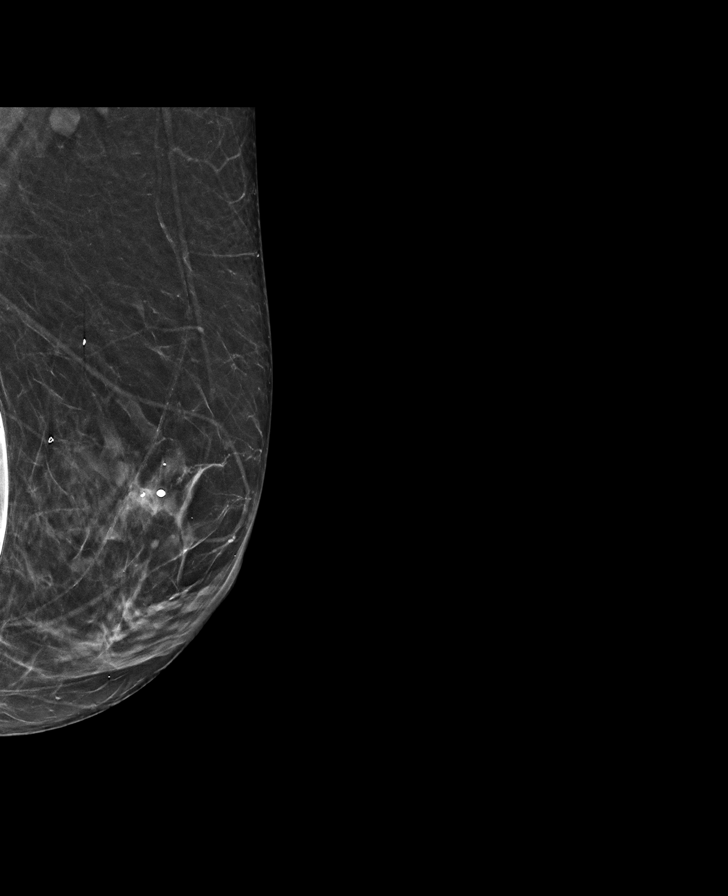

[L CCID BREAST TOMOSYNTHESIS IMAGE tomo · tomo slice 32/63.0]
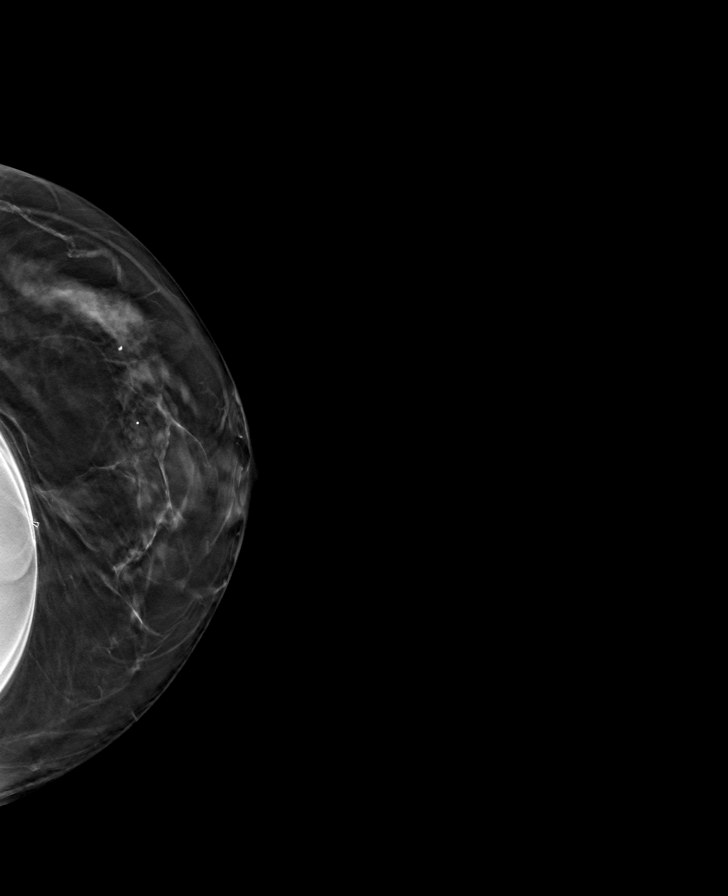

[L MLOID BREAST TOMOSYNTHESIS IMAGE tomo · tomo slice 31/60.0]
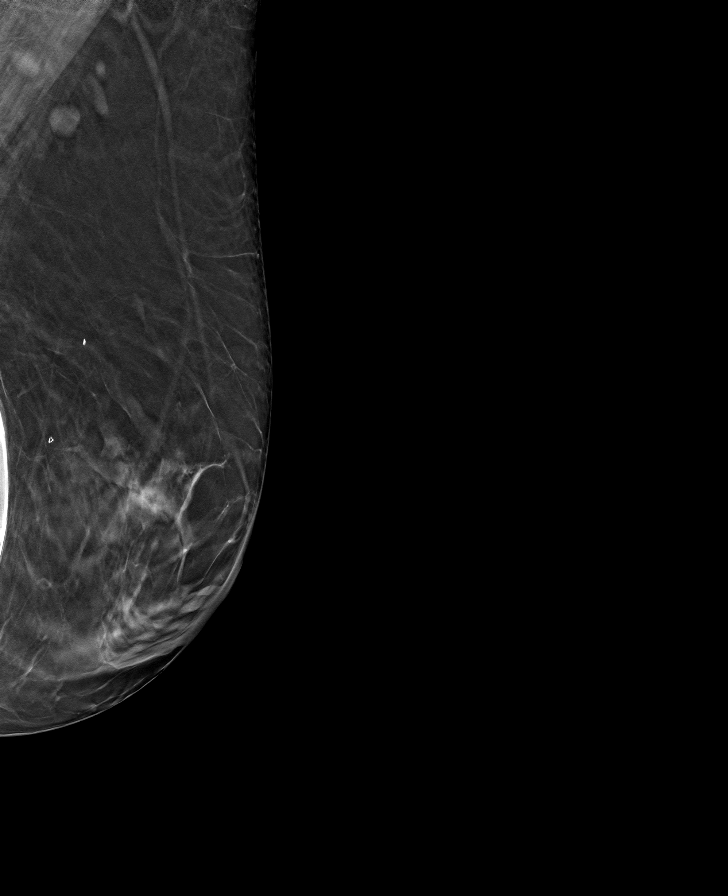

[L TAN tomo · tomo slice 31/60.0]
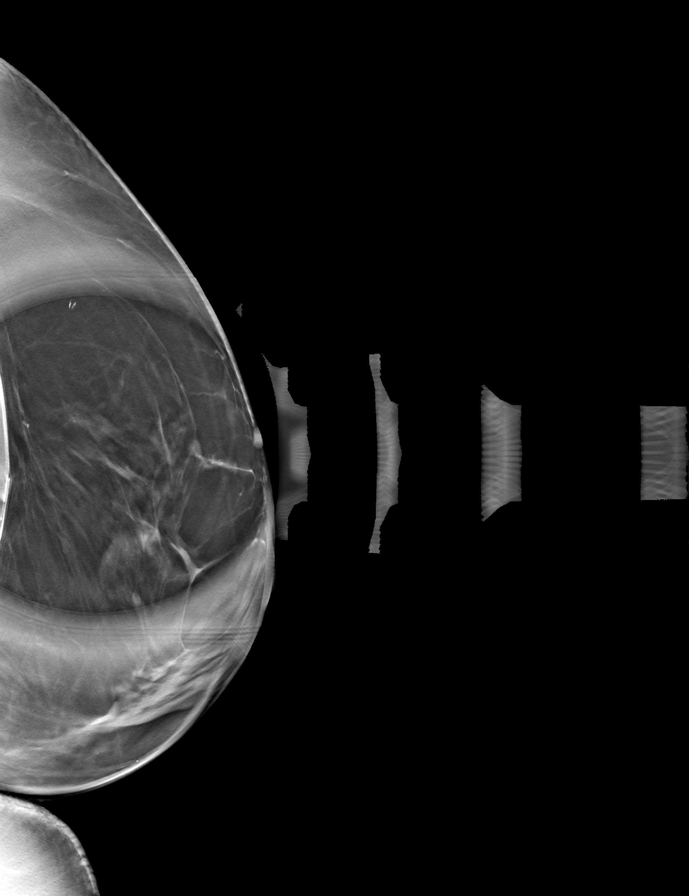

[8 of 20 positions shown; findings below may reference images not displayed]

ACR Breast Density Category b: There are scattered areas of
fibroglandular density.
FINDINGS: Stable parenchymal pattern in the left breast. No mass,
architectural distortion, or suspicious microcalcification is
identified to suggest malignancy. A spot tangential view of one of
the regions of pain was performed, and is negative.

Targeted ultrasound is performed, showing normal fibroglandular
tissue in the regions of patient pain. No mass or fluid collection
is identified. No abnormal shadowing.

Mammographic images were processed with CAD.
IMPRESSION: No evidence of malignancy in the left breast.

RECOMMENDATION:
Bilateral screening mammogram is recommended in April 2018.

I have discussed the findings and recommendations with the patient.
Results were also provided in writing at the conclusion of the
visit. If applicable, a reminder letter will be sent to the patient
regarding the next appointment.

BI-RADS CATEGORY  2: Benign.

## 2020-09-16 DIAGNOSIS — G894 Chronic pain syndrome: Secondary | ICD-10-CM | POA: Diagnosis not present

## 2020-09-16 DIAGNOSIS — M961 Postlaminectomy syndrome, not elsewhere classified: Secondary | ICD-10-CM | POA: Diagnosis not present

## 2020-09-16 DIAGNOSIS — M5137 Other intervertebral disc degeneration, lumbosacral region: Secondary | ICD-10-CM | POA: Diagnosis not present

## 2020-09-21 DIAGNOSIS — I781 Nevus, non-neoplastic: Secondary | ICD-10-CM | POA: Diagnosis not present

## 2020-09-21 DIAGNOSIS — L820 Inflamed seborrheic keratosis: Secondary | ICD-10-CM | POA: Diagnosis not present

## 2020-09-22 ENCOUNTER — Telehealth: Payer: Self-pay | Admitting: Family Medicine

## 2020-09-22 NOTE — Progress Notes (Signed)
  Chronic Care Management   Note  09/22/2020 Name: Courtney [name] MRN: [id number] DOB: 04-Apr-1946  Courtney [name] is a 75 y.o. year old female who is a primary care patient of [name], Kirsten, MD. I reached out to [place] by phone today in response to a referral sent by Ms. Vertis Kelch Fiorello's PCP, [name], Kirsten, MD.   Ms. Fryberger was given information about Chronic Care Management services today including:  1. CCM service includes personalized support from designated clinical staff supervised by her physician, including individualized plan of care and coordination with other care providers 2. 24/7 contact phone numbers for assistance for urgent and routine care needs. 3. Service will only be billed when office clinical staff spend 20 minutes or more in a month to coordinate care. 4. Only one practitioner may furnish and bill the service in a calendar month. 5. The patient may stop CCM services at any time (effective at the end of the month) by phone call to the office staff.   Patient agreed to services and verbal consent obtained.   Follow up plan:   Carley Perdue [employer] Scheduler

## 2020-09-23 ENCOUNTER — Other Ambulatory Visit: Payer: Self-pay | Admitting: Family Medicine

## 2020-10-19 ENCOUNTER — Other Ambulatory Visit: Payer: Self-pay | Admitting: Physician Assistant

## 2020-10-23 ENCOUNTER — Ambulatory Visit (INDEPENDENT_AMBULATORY_CARE_PROVIDER_SITE_OTHER): Payer: Medicare Other | Admitting: Legal Medicine

## 2020-10-23 ENCOUNTER — Encounter: Payer: Self-pay | Admitting: Legal Medicine

## 2020-10-23 ENCOUNTER — Other Ambulatory Visit: Payer: Self-pay

## 2020-10-23 VITALS — BP 120/74 | HR 68 | Temp 97.8°F | Resp 16 | Ht 65.0 in | Wt 177.0 lb

## 2020-10-23 DIAGNOSIS — K5792 Diverticulitis of intestine, part unspecified, without perforation or abscess without bleeding: Secondary | ICD-10-CM | POA: Diagnosis not present

## 2020-10-23 MED ORDER — CIPROFLOXACIN HCL 500 MG PO TABS: 500.0000 mg | ORAL_TABLET | Freq: Two times a day (BID) | ORAL | 0 refills | Status: AC

## 2020-10-23 MED ORDER — MONTELUKAST SODIUM 10 MG PO TABS: 10.0000 mg | ORAL_TABLET | Freq: Every day | ORAL | 3 refills | Status: DC

## 2020-10-23 NOTE — Progress Notes (Signed)
Subjective:  Patient ID: Courtney [name], female    DOB: 02/10/1946  Age: 75 y.o. MRN: [id number]  Chief Complaint  Patient presents with  . Abdominal Pain    HPI: recurrent diverticulitis for 2 days.  nausea and LLQ pain. No melena.  She has recurrent diverticulitis.  No fever or chills. Usually uses cipro.   Current Outpatient Medications on File Prior to Visit  Medication Sig Dispense Refill  . aspirin EC 81 MG tablet Take 81 mg by mouth daily.    [name] docusate sodium (COLACE) 100 MG capsule Take 1 capsule (100 mg total) by mouth 2 (two) times daily. 60 capsule 0  . estradiol (ESTRACE) 0.1 MG/GM vaginal cream at bedtime.    . fluticasone (FLONASE) 50 MCG/ACT nasal spray Place 2 sprays into both nostrils daily as needed for allergies.     . furosemide (LASIX) 20 MG tablet Take 1 tablet (20 mg total) by mouth daily. 30 tablet 3  . gabapentin (NEURONTIN) 600 MG tablet Take 1 tablet (600 mg total) by mouth 3 (three) times daily. 90 tablet 3  . HYDROcodone-acetaminophen (NORCO) 10-325 MG tablet Take by mouth.    . levothyroxine (SYNTHROID) 75 MCG tablet TAKE 1 TABLET(75 MCG) BY MOUTH DAILY 90 tablet 3  . metoprolol tartrate (LOPRESSOR) 50 MG tablet TAKE 1 TABLET(50 MG) BY MOUTH TWICE DAILY WITH MEALS 180 tablet 1  . nitroGLYCERIN (NITROSTAT) 0.4 MG SL tablet Place 0.4 mg under the tongue every 5 (five) minutes as needed for chest pain.   0  . omeprazole (PRILOSEC) 40 MG capsule Take 1 capsule (40 mg total) by mouth 2 (two) times daily. 60 capsule 30  . potassium chloride SA (KLOR-CON) 20 MEQ tablet TAKE 1 TABLET(20 MEQ) BY MOUTH DAILY 30 tablet 3  . prednisoLONE acetate (PRED FORTE) 1 % ophthalmic suspension Place 1 drop into the left eye 3 (three) times daily.    [name] tiZANidine (ZANAFLEX) 4 MG tablet     . tretinoin (RETIN-A) 0.05 % cream tretinoin 0.05 % topical cream     No current facility-administered medications on file prior to visit.   Past Medical History:  Diagnosis Date  . Anemia    . Arthritis   . Bronchiectasis ([place])   . Chronic idiopathic constipation   . Depression, major, recurrent, moderate ([place])   . Headache   . Heart murmur   . History of bladder infections   . History of COVID-19   . Hyperlipidemia   . Hypertension   . Hypothyroidism   . Knee pain, bilateral   . Osteoarthritis   . Pneumonia   . Primary insomnia   . Recovering alcoholic ([place])   . SVT (supraventricular tachycardia) ([place])   . UTI (urinary tract infection)   . Varicose veins   . Vitamin B12 deficiency    Past Surgical History:  Procedure Laterality Date  . ABDOMINAL HYSTERECTOMY  1991  . ANGIOPLASTY     at least 15 years ago, pt. denies   . APPENDECTOMY    . AUGMENTATION MAMMAPLASTY Bilateral   . BACK SURGERY     had 2 surgeries. Have rods placed in back   . BARIATRIC SURGERY     lap band-at least 14 years ago  . BREAST BIOPSY Left    x2  . BREAST ENHANCEMENT SURGERY  2006  . CHOLECYSTECTOMY  12/2019   removed lap band.   . COLONOSCOPY  12/01/2016   Moderate predominantly sigmoid diverticulosis.   [name] EYE SURGERY  IOL- bilateral - [place]  . KNEE ARTHROSCOPY Left   . lap band surgery  1981  . LUMBAR FUSION  07/29/2015   posterior level one  . removal of cervical disc fragments  10/2007   pt. denies     Family History  Problem Relation Age of Onset  . Heart disease Mother   . Heart disease Father   . Heart disease Brother   . Diabetes Sister   . Heart disease Brother   . Heart disease Brother   . Heart disease Brother   . Heart disease Brother   . Other Sister        BRAIN TUMOR   Social History   Socioeconomic History  . Marital status: Married    Spouse name: Not on file  . Number of children: 2  . Years of education: Not on file  . Highest education level: Not on file  Occupational History  . Occupation: retired    Comment: Marine scientist  Tobacco Use  . Smoking status: Never Smoker  . Smokeless tobacco: Never Used  Vaping Use  . Vaping Use: Never  used  Substance and Sexual Activity  . 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 Drug use: No  . Sexual activity: Not Currently    Comment: MARRIED  Other Topics Concern  . Not on file  Social History Narrative  . Not on file   Social Determinants of Health   Financial Resource Strain: Not on file  Food Insecurity: Not on file  Transportation Needs: Not on file  Physical Activity: Not on file  Stress: Not on file  Social Connections: Not on file    Review of Systems   Objective:  BP 120/74   Pulse 68   Temp 97.8 F (36.6 C)   Resp 16   Ht 5\' 5"  (1.651 m)   Wt 177 lb (80.3 kg)   SpO2 93%   BMI 29.45 kg/m   BP/Weight 10/23/2020 09/02/2020 0/73/7106  Systolic BP 269 485 462  Diastolic BP 74 72 84  Wt. (Lbs) 177 185 170  BMI 29.45 30.79 28.29    Physical Exam  Diabetic Foot Exam - Simple   No data filed      Lab Results  Component Value Date   WBC 6.2 09/02/2020   HGB 12.2 09/02/2020   HCT 36.6 09/02/2020   PLT 211 09/02/2020   GLUCOSE 104 (H) 09/02/2020   CHOL 164 10/02/2019   TRIG 138 10/02/2019   HDL 43 10/02/2019   LDLCALC 97 10/02/2019   ALT 22 09/02/2020   AST 22 09/02/2020   NA 132 (L) 09/02/2020   K 4.6 09/02/2020   CL 95 (L) 09/02/2020   CREATININE 0.96 09/02/2020   BUN 25 09/02/2020   CO2 21 09/02/2020   TSH 3.970 10/02/2019      Assessment & Plan:   Diagnoses and all orders for this visit: Acute diverticulitis -     ciprofloxacin (CIPRO) 500 MG tablet; Take 1 tablet (500 mg total) by mouth 2 (two) times daily for 10 days.   Treat with antibiotics and clear liquiids      Follow-up: Return if symptoms worsen or fail to improve.  An After Visit Summary was printed and given to the patient.  [name], MD [name] [phone]

## 2020-10-27 ENCOUNTER — Other Ambulatory Visit: Payer: Self-pay | Admitting: Legal Medicine

## 2020-11-11 ENCOUNTER — Other Ambulatory Visit: Payer: Self-pay | Admitting: Family Medicine

## 2020-11-18 ENCOUNTER — Other Ambulatory Visit: Payer: Self-pay

## 2020-11-18 ENCOUNTER — Ambulatory Visit (INDEPENDENT_AMBULATORY_CARE_PROVIDER_SITE_OTHER): Payer: Medicare Other

## 2020-11-18 DIAGNOSIS — K5904 Chronic idiopathic constipation: Secondary | ICD-10-CM

## 2020-11-18 DIAGNOSIS — I471 Supraventricular tachycardia: Secondary | ICD-10-CM

## 2020-11-18 DIAGNOSIS — E782 Mixed hyperlipidemia: Secondary | ICD-10-CM | POA: Diagnosis not present

## 2020-11-18 MED ORDER — NITROGLYCERIN 0.4 MG SL SUBL: 0.4000 mg | SUBLINGUAL_TABLET | SUBLINGUAL | 0 refills | Status: DC | PRN

## 2020-11-18 NOTE — Progress Notes (Signed)
Chronic Care Management Pharmacy Note  11/18/2020 Name:  Courtney [name] MRN:  [id number] DOB:  [birth year]   Plan Updates:   Patient does not have a chronic visit scheduled currently. Patient's last TSH in chart was from April 2021.   Patient has a history of intolerance to many cholesterol medications. We discussed option of Nexletol. Patient deferred discussion until after upcoming cardiology visit 11/27/2020. Patient states she thinks they weill be ordering tests to evaluate recent symptoms of chest tightness requiring nitroglycerin. Encouraged patient to seek emergent help if needed and call cardiology back to see if she can get in sooner. Her previous cardiologist retired and she is establishing with a new cardiologist.   Patient reports leg swelling but is not wearing compression stockings. States she probably needs to get something done about her legs but doesn't have time for all the appointments and caring for her husband.   Patient does not have a vitamin D in chart. Recommend checking with next labs. Patient previously took supplement but is not currently.    Subjective: Courtney [name] is an 75 y.o. year old female who is a primary patient of [name], Kirsten, MD.  The CCM team was consulted for assistance with disease management and care coordination needs.    Engaged with patient by telephone for initial visit in response to provider referral for pharmacy case management and/or care coordination services.   Consent to Services:  The patient was given the following information about Chronic Care Management services today, agreed to services, and gave verbal consent: 1. CCM service includes personalized support from designated clinical staff supervised by the primary care provider, including individualized plan of care and coordination with other care providers 2. 24/7 contact phone numbers for assistance for urgent and routine care needs. 3. Service will only be billed when office  clinical staff spend 20 minutes or more in a month to coordinate care. 4. Only one practitioner may furnish and bill the service in a calendar month. 5.The patient may stop CCM services at any time (effective at the end of the month) by phone call to the office staff. 6. The patient will be responsible for cost sharing (co-pay) of up to 20% of the service fee (after annual deductible is met). Patient agreed to services and consent obtained.  Patient Care Team: [name], MD as PCP - General (Family Medicine) Jettie Booze, MD as PCP - Cardiology (Cardiology) Burnice Logan, [facility] as Pharmacist (Pharmacist)  Recent office visits: 10/23/2020 - cipro for diverticulitis.  09/02/2020 - back pain. Cipro 500 mg bid for possible diverticulosis. Obtain x-ray.  07/09/2020 - strep pharyngitis - augmentin and enzonotate.  06/08/2020 - presents with UTI symptoms. Positive for bacterial vaginitis. Metronidazole 500 mg tid for 1 week.  Recent consult visits: 09/17/2019 - pain management - continue current medications without changes.  06/18/2020 - pain management - continue current medications.  Hospital visits: None in previous 6 months   Objective:  Lab Results  Component Value Date   CREATININE 0.96 09/02/2020   BUN 25 09/02/2020   GFRNONAA 66 02/17/2020   GFRAA 76 02/17/2020   NA 132 (L) 09/02/2020   K 4.6 09/02/2020   CALCIUM 10.0 09/02/2020   CO2 21 09/02/2020   GLUCOSE 104 (H) 09/02/2020    No results found for: HGBA1C, FRUCTOSAMINE, GFR, MICROALBUR  Last diabetic Eye exam: No results found for: HMDIABEYEEXA  Last diabetic Foot exam: No results found for: HMDIABFOOTEX   Lab Results  Component  Value Date   CHOL 164 10/02/2019   HDL 43 10/02/2019   LDLCALC 97 10/02/2019   TRIG 138 10/02/2019   CHOLHDL 3.8 10/02/2019    Hepatic Function Latest Ref Rng & Units 09/02/2020 02/17/2020 01/30/2020  Total Protein 6.0 - 8.5 g/dL 6.9 6.9 6.7  Albumin 3.7 - 4.7 g/dL 4.6 4.3 4.3  AST  0 - 40 IU/L _0 ALT 0 - 32 IU/L _1 Alk Phosphatase 44 - 121 IU/L 100 120 109  Total Bilirubin 0.0 - 1.2 mg/dL 0.4 0.4 0.3      CBC Latest Ref Rng & Units 09/02/2020 02/17/2020 01/30/2020  WBC 3.4 - 10.8 x10E3/uL 6.2 10.8 6.4  Hemoglobin 11.1 - 15.9 g/dL 12.2 12.0 12.0  Hematocrit 34.0 - 46.6 % 36.6 36.3 36.7  Platelets 150 - 450 x10E3/uL 211 255 222    No results found for: VD25OH  Clinical ASCVD: No  The 10-year ASCVD risk score Mikey Bussing DC Jr., et al., 2013) is: 16.6%   Values used to calculate the score:     Age: 53 years     Sex: Female     Is Non-Hispanic African American: No     Diabetic: No     Tobacco smoker: No     Systolic Blood Pressure: 637 mmHg     Is BP treated: Yes     HDL Cholesterol: 43 mg/dL     Total Cholesterol: 164 mg/dL    Depression screen PHQ 2/9 05/11/2020  Decreased Interest 0  Down, Depressed, Hopeless 0  PHQ - 2 Score 0     Other: (CHADS2VASc if Afib, MMRC or CAT for COPD, ACT, DEXA)  Social History   Tobacco Use  Smoking Status Never Smoker  Smokeless Tobacco Never Used   BP Readings from Last 3 Encounters:  10/23/20 120/74  09/02/20 (!) 142/72  07/09/20 110/84   Pulse Readings from Last 3 Encounters:  10/23/20 68  09/02/20 74  07/09/20 78   Wt Readings from Last 3 Encounters:  10/23/20 177 lb (80.3 kg)  09/02/20 185 lb (83.9 kg)  07/09/20 170 lb (77.1 kg)   BMI Readings from Last 3 Encounters:  10/23/20 29.45 kg/m  09/02/20 30.79 kg/m  07/09/20 28.29 kg/m    Assessment/Interventions: Review of patient past medical history, allergies, medications, health status, including review of consultants reports, laboratory and other test data, was performed as part of comprehensive evaluation and provision of chronic care management services.   SDOH:  (Social Determinants of Health) assessments and interventions performed: Yes  SDOH Screenings   Alcohol Screen: Not on file  Depression (PHQ2-9): Low Risk   . PHQ-2  Score: 0  Financial Resource Strain: Not on file  Food Insecurity: No Food Insecurity  . Worried About Charity fundraiser in the Last Year: Never true  . Ran Out of Food in the Last Year: Never true  Housing: Low Risk   . Last Housing Risk Score: 0  Physical Activity: Not on file  Social Connections: Not on file  Stress: Not on file  Tobacco Use: Low Risk   . Smoking Tobacco Use: Never Smoker  . Smokeless Tobacco Use: Never Used  Transportation Needs: No Transportation Needs  . Lack of Transportation (Medical): No  . Lack of Transportation (Non-Medical): No    CCM Care Plan  Allergies  Allergen Reactions  . Levofloxacin Hives, Shortness Of Breath and Swelling    RASH IN MOUTH   (can take IV route)  .  Bactrim [Sulfamethoxazole-Trimethoprim] Hives and Itching  . Clarithromycin     GI upset  . Lyrica [Pregabalin]   . Macrobid [Nitrofurantoin]     Not tolerate  . Statins Other (See Comments)    Myalgias    Medications Reviewed Today    Reviewed by Burnice Logan, [facility] (Pharmacist) on 11/18/20 at 1441  Med List Status: <None>  Medication Order Taking? Sig Documenting Provider Last Dose Status Informant  aspirin (ASPIRIN 81) 81 MG chewable tablet [id number] Yes Chew 81 mg by mouth daily. [provider] Taking Active   calcium carbonate (TUMS - DOSED IN MG ELEMENTAL CALCIUM) 500 MG chewable tablet [id number] Yes Chew 1 tablet by mouth daily as needed for indigestion or heartburn. [provider] Taking Active   docusate sodium (COLACE) 100 MG capsule [id number] Yes Take 1 capsule (100 mg total) by mouth 2 (two) times daily.  Patient taking differently: Take 100 mg by mouth daily.   Newman Pies, MD Taking Active   fluticasone [facility]) 50 MCG/ACT nasal spray [id number] Yes Place 2 sprays into both nostrils daily as needed for allergies.  [provider] Taking Active Self  furosemide (LASIX) 20 MG tablet [id number] Yes Take 1 tablet (20 mg total) by  mouth daily. [name], Kirsten, MD Taking Active   gabapentin (NEURONTIN) 600 MG tablet [id number] Yes Take 1 tablet (600 mg total) by mouth 3 (three) times daily. [name], Kirsten, MD Taking Active            Med Note [place], [place] Nov 18, 2020  2:33 PM) Patient takes 1/2 tablet twice daily due to sedation  HYDROcodone-acetaminophen (NORCO) 10-325 MG tablet [id number] Yes Take 1 tablet by mouth 3 (three) times daily as needed. [provider] Taking Active   levothyroxine (SYNTHROID) 75 MCG tablet [id number] Yes TAKE 1 TABLET(75 MCG) BY MOUTH DAILY Marge Duncans, PA-C Taking Active   metoprolol tartrate (LOPRESSOR) 50 MG tablet [id number] Yes TAKE 1 TABLET(50 MG) BY MOUTH TWICE DAILY WITH MEALS [name], Kirsten, MD Taking Active   montelukast (SINGULAIR) 10 MG tablet [id number] Yes Take 1 tablet (10 mg total) by mouth at bedtime. [name], Kirsten, MD Taking Active   Multiple Vitamins-Minerals (MULTIVITAMIN WITH MINERALS) tablet [id number] Yes Take 1 tablet by mouth daily. [provider] Taking Active   nitroGLYCERIN (NITROSTAT) 0.4 MG SL tablet [id number] Yes Place 0.4 mg under the tongue every 5 (five) minutes as needed for chest pain.  [provider] Taking Active Self           Med Note Ramon Dredge, JESSICA K   Mon Sep 03, 2018 10:40 AM) Many years  omeprazole (PRILOSEC) 40 MG capsule [id number] Yes Take 1 capsule (40 mg total) by mouth 2 (two) times daily.  Patient taking differently: Take 40 mg by mouth daily as needed.   [name], Kirsten, MD Taking Active   potassium chloride SA (KLOR-CON) 20 MEQ tablet [id number] Yes TAKE 1 TABLET(20 MEQ) BY MOUTH DAILY Marge Duncans, PA-C Taking Active   tiZANidine (ZANAFLEX) 4 MG tablet [id number] Yes Take 4 mg by mouth daily as needed. [provider] Taking Active   tretinoin (RETIN-A) 0.05 % cream [id number] Yes tretinoin 0.05 % topical cream [provider] Taking Active   vitamin E (VITAMIN E) 180 MG (400 UNITS) capsule [id number] Yes  Take 400 Units by mouth daily. [provider] Taking Active           Patient Active Problem List   Diagnosis Date Noted  .  Acute cystitis with hematuria 02/27/2020  . 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 Chronic idiopathic constipation 10/06/2019  . Seasonal allergic rhinitis due to pollen 10/06/2019  . Mixed hyperlipidemia 10/02/2019  . Supraventricular tachycardia ([place]) 10/02/2019  . Myalgia 10/02/2019  . Joint pain 10/02/2019  . Pain in both lower extremities 10/02/2019  . Peripheral vascular disease ([place]) 09/11/2019  . Bronchiectasis with acute lower respiratory infection ([place]) 08/23/2019  . Acute cystitis without hematuria 08/23/2019  . Spinal stenosis of lumbar region with neurogenic claudication 09/03/2018  . Leg swelling 04/09/2018  . Family history of early CAD 04/09/2018  . Spondylolisthesis of lumbar region 07/29/2015  . Varicose veins of both lower extremities with pain 02/10/2015  . Chest wall pain 07/27/2014  . 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 Influenza, High Dose Seasonal PF 04/11/2017, 03/26/2018  . Influenza,inj,Quad PF,6+ Mos 02/22/2014, 03/23/2015, 02/19/2019  . Influenza-Unspecified 02/22/2014  . Moderna Sars-Covid-2 Vaccination 07/22/2019, 08/12/2019  . Pneumococcal Conjugate-13 02/16/2017  . Pneumococcal Polysaccharide-23 04/07/2015  . Zoster, Live 04/13/2006    Conditions to be addressed/monitored:  Hyperlipidemia, Hypothyroidism, Allergic Rhinitis and peripheral vascular disease.   Care Plan : [place]  Updates made by Burnice Logan, [facility] since 11/18/2020 12:00 AM    Problem: svt, hld, constipation, gerd, pain   Priority: High  Onset Date: 11/18/2020    Long-Range Goal: Disease State Management   Start Date: 11/18/2020  Expected End Date: 11/18/2021  This Visit's Progress: On track  Priority: High  Note:   Current Barriers:   . Unable to achieve control of cholesterol   Pharmacist Clinical Goal(s):  [name] Patient will achieve control of cholesterol  as evidenced by lipid panel through collaboration with PharmD and provider.   Interventions: . 1:1 collaboration with [name], MD regarding development and update of comprehensive plan of care as evidenced by provider attestation and co-signature . Inter-disciplinary care team collaboration (see longitudinal plan of care) . Comprehensive medication review performed; medication list updated in electronic medical record  Hyperlipidemia: (LDL goal < 55) -Controlled -Current treatment: .  needs to start a statin but waiting until she can see cardiology in the next few weeks - she has been intolerant in the past.  -Medications previously tried: Repatha - leg pain that discontinued once stoping medications, statins, zetia, Lipitor -Current dietary patterns: avoids breads and sweets. Wife tries to cook healthy. Meat and 2 vegetables is common.  -Current exercise habits: pool exercise, walk, works in garden  -Educated on Cholesterol goals;  Benefits of statin for ASCVD risk reduction; Importance of limiting foods high in cholesterol; Exercise goal of 150 minutes per week; Strategies to manage statin-induced myalgias; -Counseled on diet and exercise extensively Counseled on benefit of tighter cholesterol control since heart disease in so many primary relatives. Patient will see Cardiology 11/27/2020. She prefers to discuss Nexletol with cardiology at that time. Encouraged patient to call for sooner visit due to recent episodes requiring nitroglycerin. 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 Pharmacist helped coordinate refill request for nitroglycerin on patient's behalf.    SVT (Goal: manage heart rate) -Controlled -Current treatment  . metoprolol tartrate 50 mg bid with meals  . Aspirin ec 81 mg daily  -Medications  previously tried: none reported -Recommended to continue current medication. Patient reports symptoms are controlled.   Allergic Rhinitis (Goal: manage symptoms ) -Controlled -Current treatment  .  Montelukast 10 mg at bedtime  . flonase nasal spray 2 sprays daily prn allergies -Medications previously tried: none reporte  -Recommended to continue current medication  Chronic Constipation (Goal: manage symptoms of constipation) -Controlled -Current treatment  . docusate 100 mg daily -Medications previously tried: none reported  -Counseled on diet and exercise extensively Recommended to continue current medication. Patient reports eating a variety of vegetables in diet.   Back Pain  (Goal: manage symptoms of back pain ) -Controlled -Current treatment  . Hydrocodone-acetaminophen 10/325 mg tid prn  . Gabapentin 300 mg bid (takes 1/2 tablet of 600 mg due to sedation/drowsiness) . Tizanidine 4 mg daily at bedtime prn  -Medications previously tried: none reported  -Recommended to continue current medication Counseled on caution with sedation or unsteadiness on feet. Patient denies issues. Patient reports that she needs to follow-up with specialist on her back pain  Bone Health (Goal keep bones healthy and strong) -Controlled -Last DEXA Scan:  07/28/2017  T-Score femoral neck: -0.5  T-Score forearm radius: -1.6  10-year probability of major osteoporotic fracture: 8%  10-year probability of hip fracture: .7% -Patient is not a candidate for pharmacologic treatment -Current treatment  . Diet and lifestyle -Medications previously tried: n/a  -Recommend [phone] units of vitamin D daily. Recommend 1200 mg of calcium daily from dietary and supplemental sources. Recommend weight-bearing and muscle strengthening exercises for building and maintaining bone density. -Counseled on diet and exercise extensively  Hypothyroidism (Goal: manage TSH) -Controlled -Current treatment   . Levothyroxine 75 mcg daily  -Medications previously tried: none reported  -Recommended to continue current medication Recommended updated TSH. Last checked in chart 10/02/2019.    GERD (Goal: manage symptoms) -Controlled -Current treatment  . omeprazole 40 mg daily prn . Tums OTC prn   -Medications previously tried: none reported  -Counseled on diet and exercise extensively Educated on benefits of managing symptoms with Tums or Pepcid as opposed to omeprazole. Patient reports using as needed.   Leg Swelling (Goal: manage symptoms of leg swelling ) -Not ideally controlled -Current treatment   Furosemide 20 mg daily   Potassium 20 meq daily  -Medications previously tried: none reported  -Counseled on diet and exercise extensively Recommended to continue current medication Counseled on benefits of compression stockings. Patient reports that she canot wear them in the winter. States she needs to see a vein doctor to address some of her issues but is busy caregiving for her husband and often puts her own health on  hold.   Health Maintenance -Vaccine gaps: Recommend Shingrix and Covid booster - will discuss at future visit  -Current therapy:  . Tretinoin 0.05% cream  . Nitroglycerin 0.4 mg sl prn  . Vitamin E daily  . Multivitamin daily  -Educated on Cost vs benefit of each product must be carefully weighed by individual consumer -Patient is satisfied with current therapy and denies issues -Recommended to continue current medication   Patient Goals/Self-Care Activities . Patient will:  - take medications as prescribed focus on medication adherence by using pill box target a minimum of 150 minutes of moderate intensity exercise weekly engage in dietary modifications by limiting fried/fatty foods and continuing to eat lean protein and vegetables.   Follow Up Plan: Telephone follow up appointment with care management team member scheduled for: 11/2021      Medication  Assistance: None required.  Patient affirms current coverage meets needs.  Compliance/Adherence/Medication fill history: Care Gaps: AWV needed   Star-Rating Drugs: n/a  Patient's preferred pharmacy is:  [id number] [place], [address] [place] [address] Phone: [phone] Fax: [phone]  [facility] [place], [address] [place] [address] Phone: [phone] Fax: [phone]  Uses pill box? Yes Pt endorses good compliance  We discussed: Benefits of medication synchronization, packaging and delivery as well as enhanced pharmacist oversight with [employer]. Patient decided to: Continue current medication management strategy  Care Plan and Follow Up Patient Decision:  Patient agrees to Care Plan and Follow-up.  Plan: Telephone follow up appointment with care management team member scheduled for:  11/2021

## 2020-11-18 NOTE — Patient Instructions (Signed)
Visit Information  Thank you for your time discussing your medications. I look forward to working with you to achieve your health care goals. Below is a summary of what we talked about during our visit.   Goals Addressed   None     There are no care plans to display for this patient.    Courtney [name] was given information about Chronic Care Management services today including:  1. CCM service includes personalized support from designated clinical staff supervised by her physician, including individualized plan of care and coordination with other care providers 2. 24/7 contact phone numbers for assistance for urgent and routine care needs. 3. Standard insurance, coinsurance, copays and deductibles apply for chronic care management only during months in which we provide at least 20 minutes of these services. Most insurances cover these services at 100%, however patients may be responsible for any copay, coinsurance and/or deductible if applicable. This service may help you avoid the need for more expensive face-to-face services. 4. Only one practitioner may furnish and bill the service in a calendar month. 5. The patient may stop CCM services at any time (effective at the end of the month) by phone call to the office staff.  Patient agreed to services and verbal consent obtained.   The patient verbalized understanding of instructions, educational materials, and care plan provided today and declined offer to receive copy of patient instructions, educational materials, and care plan.  Telephone follow up appointment with pharmacy team member scheduled for: 06/23  Courtney [name], PharmD Clinical Pharmacist [place] [phone] (office) [phone] (mobile)  [email or web address]">  DASH Eating Plan DASH stands for Dietary Approaches to Stop Hypertension. The DASH eating plan is a healthy eating plan that has been shown to:  Reduce high  blood pressure (hypertension).  Reduce your risk for type 2 diabetes, heart disease, and stroke.  Help with weight loss. What are tips for following this plan? Reading food labels  Check food labels for the amount of salt (sodium) per serving. Choose foods with less than 5 percent of the Daily Value of sodium. Generally, foods with less than 300 milligrams (mg) of sodium per serving fit into this eating plan.  To find whole grains, look for the word "whole" as the first word in the ingredient list. Shopping  Buy products labeled as "low-sodium" or "no salt added."  Buy fresh foods. Avoid canned foods and pre-made or frozen meals. Cooking  Avoid adding salt when cooking. Use salt-free seasonings or herbs instead of table salt or sea salt. Check with your health care provider or pharmacist before using salt substitutes.  Do not fry foods. Cook foods using healthy methods such as baking, boiling, grilling, roasting, and broiling instead.  Cook with heart-healthy oils, such as olive, canola, avocado, soybean, or sunflower oil. Meal planning  Eat a balanced diet that includes: ? 4 or more servings of fruits and 4 or more servings of vegetables each day. Try to fill one-half of your plate with fruits and vegetables. ? 6-8 servings of whole grains each day. ? Less than 6 oz (170 g) of lean meat, poultry, or fish each day. A 3-oz (85-g) serving of meat is about the same size as a deck of cards. One egg equals 1 oz (28 g). ? 2-3 servings of low-fat dairy each day. One serving is 1 cup (237 mL). ? 1 serving of nuts, seeds, or beans 5 times each week. ? 2-3 servings of heart-healthy fats. Healthy fats called omega-3  fatty acids are found in foods such as walnuts, flaxseeds, fortified milks, and eggs. These fats are also found in cold-water fish, such as sardines, salmon, and mackerel.  Limit how much you eat of: ? Canned or prepackaged foods. ? Food that is high in trans fat, such as some  fried foods. ? Food that is high in saturated fat, such as fatty meat. ? Desserts and other sweets, sugary drinks, and other foods with added sugar. ? Full-fat dairy products.  Do not salt foods before eating.  Do not eat more than 4 egg yolks a week.  Try to eat at least 2 vegetarian meals a week.  Eat more home-cooked food and less restaurant, buffet, and fast food.   Lifestyle  When eating at a restaurant, ask that your food be prepared with less salt or no salt, if possible.  If you drink alcohol: ? Limit how much you use to:  0-1 drink a day for women who are not pregnant.  0-2 drinks a day for men. ? Be aware of how much alcohol is in your drink. In the [place] one drink equals one 12 oz bottle of beer (355 mL), one 5 oz glass of wine (148 mL), or one 1 oz glass of hard liquor (44 mL). General information  Avoid eating more than 2,300 mg of salt a day. If you have hypertension, you may need to reduce your sodium intake to 1,500 mg a day.  Work with your health care provider to maintain a healthy body weight or to lose weight. Ask what an ideal weight is for you.  Get at least 30 minutes of exercise that causes your heart to beat faster (aerobic exercise) most days of the week. Activities may include walking, swimming, or biking.  Work with your health care provider or dietitian to adjust your eating plan to your individual calorie needs. What foods should I eat? Fruits All fresh, dried, or frozen fruit. Canned fruit in natural juice (without added sugar). Vegetables Fresh or frozen vegetables (raw, steamed, roasted, or grilled). Low-sodium or reduced-sodium tomato and vegetable juice. Low-sodium or reduced-sodium tomato sauce and tomato paste. Low-sodium or reduced-sodium canned vegetables. Grains Whole-grain or whole-wheat bread. Whole-grain or whole-wheat pasta. Gara Kincade rice. [place] Morrow. Bulgur. Whole-grain and low-sodium cereals. Pita bread. Low-fat, low-sodium  crackers. Whole-wheat flour tortillas. Meats and other proteins Skinless chicken or [place]. Ground chicken or [place]. Pork with fat trimmed off. Fish and seafood. Egg whites. Dried beans, peas, or lentils. Unsalted nuts, nut butters, and seeds. Unsalted canned beans. Lean cuts of beef with fat trimmed off. Low-sodium, lean precooked or cured meat, such as sausages or meat loaves. Dairy Low-fat (1%) or fat-free (skim) milk. Reduced-fat, low-fat, or fat-free cheeses. Nonfat, low-sodium ricotta or cottage cheese. Low-fat or nonfat yogurt. Low-fat, low-sodium cheese. Fats and oils Soft margarine without trans fats. Vegetable oil. Reduced-fat, low-fat, or light mayonnaise and salad dressings (reduced-sodium). Canola, safflower, olive, avocado, soybean, and sunflower oils. Avocado. Seasonings and condiments Herbs. Spices. Seasoning mixes without salt. Other foods Unsalted popcorn and pretzels. Fat-free sweets. The items listed above may not be a complete list of foods and beverages you can eat. Contact a dietitian for more information. What foods should I avoid? Fruits Canned fruit in a light or heavy syrup. Fried fruit. Fruit in cream or butter sauce. Vegetables Creamed or fried vegetables. Vegetables in a cheese sauce. Regular canned vegetables (not low-sodium or reduced-sodium). Regular canned tomato sauce and paste (not low-sodium or reduced-sodium). Regular tomato  and vegetable juice (not low-sodium or reduced-sodium). Angie Fava. Olives. Grains Baked goods made with fat, such as croissants, muffins, or some breads. Dry pasta or rice meal packs. Meats and other proteins Fatty cuts of meat. Ribs. Fried meat. Berniece Salines. Bologna, salami, and other precooked or cured meats, such as sausages or meat loaves. Fat from the back of a pig (fatback). Bratwurst. Salted nuts and seeds. Canned beans with added salt. Canned or smoked fish. Whole eggs or egg yolks. Chicken or [place] with skin. Dairy Whole or 2% milk,  cream, and half-and-half. Whole or full-fat cream cheese. Whole-fat or sweetened yogurt. Full-fat cheese. Nondairy creamers. Whipped toppings. Processed cheese and cheese spreads. Fats and oils Butter. Stick margarine. Lard. Shortening. Ghee. Bacon fat. Tropical oils, such as coconut, palm kernel, or palm oil. Seasonings and condiments Onion salt, garlic salt, seasoned salt, table salt, and sea salt. Worcestershire sauce. Tartar sauce. Barbecue sauce. Teriyaki sauce. Soy sauce, including reduced-sodium. Steak sauce. Canned and packaged gravies. Fish sauce. Oyster sauce. Cocktail sauce. Store-bought horseradish. Ketchup. Mustard. Meat flavorings and tenderizers. Bouillon cubes. Hot sauces. Pre-made or packaged marinades. Pre-made or packaged taco seasonings. Relishes. Regular salad dressings. Other foods Salted popcorn and pretzels. The items listed above may not be a complete list of foods and beverages you should avoid. Contact a dietitian for more information. Where to find more information  National Heart, Lung, and Blood Institute: https://wilson-eaton.com/  American Heart Association: www.heart.org  Academy of Nutrition and Dietetics: www.eatright.Magazine: www.kidney.org Summary  The DASH eating plan is a healthy eating plan that has been shown to reduce high blood pressure (hypertension). It may also reduce your risk for type 2 diabetes, heart disease, and stroke.  When on the DASH eating plan, aim to eat more fresh fruits and vegetables, whole grains, lean proteins, low-fat dairy, and heart-healthy fats.  With the DASH eating plan, you should limit salt (sodium) intake to 2,300 mg a day. If you have hypertension, you may need to reduce your sodium intake to 1,500 mg a day.  Work with your health care provider or dietitian to adjust your eating plan to your individual calorie needs. This information is not intended to replace advice given to you by your health care  provider. Make sure you discuss any questions you have with your health care provider. Document Revised: 05/10/2019 Document Reviewed: 05/10/2019 Elsevier Patient Education  2021 [employer].

## 2020-11-27 DIAGNOSIS — R0600 Dyspnea, unspecified: Secondary | ICD-10-CM | POA: Diagnosis not present

## 2020-11-27 DIAGNOSIS — R0602 Shortness of breath: Secondary | ICD-10-CM | POA: Diagnosis not present

## 2020-11-27 DIAGNOSIS — R079 Chest pain, unspecified: Secondary | ICD-10-CM | POA: Diagnosis not present

## 2020-11-27 DIAGNOSIS — R002 Palpitations: Secondary | ICD-10-CM | POA: Diagnosis not present

## 2020-11-27 DIAGNOSIS — E7800 Pure hypercholesterolemia, unspecified: Secondary | ICD-10-CM | POA: Diagnosis not present

## 2020-11-27 DIAGNOSIS — I10 Essential (primary) hypertension: Secondary | ICD-10-CM | POA: Diagnosis not present

## 2020-12-08 DIAGNOSIS — R079 Chest pain, unspecified: Secondary | ICD-10-CM | POA: Diagnosis not present

## 2020-12-08 DIAGNOSIS — R0600 Dyspnea, unspecified: Secondary | ICD-10-CM | POA: Diagnosis not present

## 2020-12-11 DIAGNOSIS — I10 Essential (primary) hypertension: Secondary | ICD-10-CM | POA: Diagnosis not present

## 2020-12-11 DIAGNOSIS — E7800 Pure hypercholesterolemia, unspecified: Secondary | ICD-10-CM | POA: Diagnosis not present

## 2020-12-11 DIAGNOSIS — R9439 Abnormal result of other cardiovascular function study: Secondary | ICD-10-CM | POA: Diagnosis not present

## 2020-12-11 DIAGNOSIS — R0602 Shortness of breath: Secondary | ICD-10-CM | POA: Diagnosis not present

## 2020-12-11 DIAGNOSIS — R0600 Dyspnea, unspecified: Secondary | ICD-10-CM | POA: Diagnosis not present

## 2020-12-11 DIAGNOSIS — R002 Palpitations: Secondary | ICD-10-CM | POA: Diagnosis not present

## 2020-12-18 DIAGNOSIS — M48062 Spinal stenosis, lumbar region with neurogenic claudication: Secondary | ICD-10-CM | POA: Diagnosis not present

## 2020-12-18 DIAGNOSIS — M5137 Other intervertebral disc degeneration, lumbosacral region: Secondary | ICD-10-CM | POA: Diagnosis not present

## 2020-12-18 DIAGNOSIS — G894 Chronic pain syndrome: Secondary | ICD-10-CM | POA: Diagnosis not present

## 2020-12-18 DIAGNOSIS — M961 Postlaminectomy syndrome, not elsewhere classified: Secondary | ICD-10-CM | POA: Diagnosis not present

## 2020-12-22 DIAGNOSIS — I491 Atrial premature depolarization: Secondary | ICD-10-CM | POA: Diagnosis not present

## 2020-12-22 DIAGNOSIS — I2510 Atherosclerotic heart disease of native coronary artery without angina pectoris: Secondary | ICD-10-CM | POA: Diagnosis not present

## 2020-12-22 DIAGNOSIS — I739 Peripheral vascular disease, unspecified: Secondary | ICD-10-CM | POA: Diagnosis not present

## 2020-12-22 DIAGNOSIS — R9439 Abnormal result of other cardiovascular function study: Secondary | ICD-10-CM | POA: Diagnosis not present

## 2020-12-22 DIAGNOSIS — I10 Essential (primary) hypertension: Secondary | ICD-10-CM | POA: Diagnosis not present

## 2020-12-22 DIAGNOSIS — I499 Cardiac arrhythmia, unspecified: Secondary | ICD-10-CM | POA: Diagnosis not present

## 2020-12-22 HISTORY — PX: CARDIAC CATHETERIZATION: SHX172

## 2020-12-23 DIAGNOSIS — Z955 Presence of coronary angioplasty implant and graft: Secondary | ICD-10-CM | POA: Diagnosis not present

## 2020-12-23 DIAGNOSIS — I10 Essential (primary) hypertension: Secondary | ICD-10-CM | POA: Diagnosis not present

## 2020-12-23 DIAGNOSIS — R9439 Abnormal result of other cardiovascular function study: Secondary | ICD-10-CM | POA: Diagnosis not present

## 2020-12-23 DIAGNOSIS — Z9861 Coronary angioplasty status: Secondary | ICD-10-CM | POA: Insufficient documentation

## 2020-12-23 DIAGNOSIS — I491 Atrial premature depolarization: Secondary | ICD-10-CM | POA: Diagnosis not present

## 2020-12-23 DIAGNOSIS — I2510 Atherosclerotic heart disease of native coronary artery without angina pectoris: Secondary | ICD-10-CM | POA: Diagnosis not present

## 2020-12-24 ENCOUNTER — Other Ambulatory Visit: Payer: Self-pay

## 2020-12-24 ENCOUNTER — Telehealth: Payer: Self-pay | Admitting: Pulmonary Disease

## 2020-12-24 DIAGNOSIS — Z1231 Encounter for screening mammogram for malignant neoplasm of breast: Secondary | ICD-10-CM

## 2020-12-24 NOTE — Telephone Encounter (Signed)
LMTCB x 1 

## 2020-12-24 NOTE — Telephone Encounter (Signed)
Patient returned phone call, confirmed DOB. Patient states she was supposed to have a CT but her husband got sick and is now on dialysis. She states she just wanted to make an appt to f/u with Dr. Vaughan Browner.   She reports her SOB is worse than her baseline. She reports she has a constant soreness or like ache in her chest when she breathes in and out. She reports she was given Memory Dance but she needs a refill. Denies chest pain. Denies cough. Patient wants to know if there are any interventions she can take prior to her appt. Denies any other symptoms.   TP please advise. I was unable to find an appt sooner than August with an appt. Would you be willing to refill Breo? Any other recommendations for patient? Thanks :)

## 2020-12-24 NOTE — Telephone Encounter (Signed)
Call returned to patient, confirmed DOB. It appears there was a cancellation. Offered appt Monday patient declined stating she had an appt with her cardiologist Monday. She opted to keep her appt in August with Dr Vaughan Browner and she would f/u with her PCP and cardiologist in the mean time. I made her aware to give [place] a call back if her symptoms worsen. Voiced understanding.   Nothing further needed at this time.

## 2020-12-24 NOTE — Telephone Encounter (Signed)
Started here that she has not feeling well.  If she is having chest pain and worsening shortness of breath and were unable to get her in for an office visit then we will need to reach out to her primary care doctor or go to urgent care Can look to see if she can get in with another provider in the next week or so  Please contact office for sooner follow up if symptoms do not improve or worsen or seek emergency care

## 2020-12-28 DIAGNOSIS — Z79899 Other long term (current) drug therapy: Secondary | ICD-10-CM | POA: Diagnosis not present

## 2020-12-28 DIAGNOSIS — R600 Localized edema: Secondary | ICD-10-CM | POA: Diagnosis not present

## 2020-12-28 DIAGNOSIS — Z955 Presence of coronary angioplasty implant and graft: Secondary | ICD-10-CM | POA: Diagnosis not present

## 2020-12-28 DIAGNOSIS — Z7982 Long term (current) use of aspirin: Secondary | ICD-10-CM | POA: Diagnosis not present

## 2020-12-28 DIAGNOSIS — I10 Essential (primary) hypertension: Secondary | ICD-10-CM | POA: Diagnosis not present

## 2020-12-28 DIAGNOSIS — I2510 Atherosclerotic heart disease of native coronary artery without angina pectoris: Secondary | ICD-10-CM | POA: Diagnosis not present

## 2020-12-28 DIAGNOSIS — R609 Edema, unspecified: Secondary | ICD-10-CM | POA: Diagnosis not present

## 2020-12-30 ENCOUNTER — Other Ambulatory Visit: Payer: Self-pay | Admitting: Family Medicine

## 2020-12-30 ENCOUNTER — Ambulatory Visit (INDEPENDENT_AMBULATORY_CARE_PROVIDER_SITE_OTHER): Payer: Medicare Other | Admitting: Family Medicine

## 2020-12-30 ENCOUNTER — Other Ambulatory Visit: Payer: Self-pay

## 2020-12-30 VITALS — BP 110/62 | HR 60 | Resp 14 | Ht 65.0 in | Wt 175.0 lb

## 2020-12-30 DIAGNOSIS — Z9882 Breast implant status: Secondary | ICD-10-CM | POA: Diagnosis not present

## 2020-12-30 DIAGNOSIS — G4489 Other headache syndrome: Secondary | ICD-10-CM

## 2020-12-30 DIAGNOSIS — N3000 Acute cystitis without hematuria: Secondary | ICD-10-CM | POA: Diagnosis not present

## 2020-12-30 DIAGNOSIS — N644 Mastodynia: Secondary | ICD-10-CM | POA: Diagnosis not present

## 2020-12-30 DIAGNOSIS — I2510 Atherosclerotic heart disease of native coronary artery without angina pectoris: Secondary | ICD-10-CM

## 2020-12-30 DIAGNOSIS — E782 Mixed hyperlipidemia: Secondary | ICD-10-CM

## 2020-12-30 LAB — POCT URINALYSIS DIPSTICK
Bilirubin, UA: NEGATIVE
Blood, UA: NEGATIVE
Glucose, UA: NEGATIVE
Ketones, UA: NEGATIVE
Nitrite, UA: POSITIVE
Protein, UA: NEGATIVE
Spec Grav, UA: 1.015 (ref 1.010–1.025)
Urobilinogen, UA: 1 E.U./dL
pH, UA: 7 (ref 5.0–8.0)

## 2020-12-30 MED ORDER — TOPIRAMATE 25 MG PO TABS: ORAL_TABLET | ORAL | 0 refills | Status: DC

## 2020-12-30 MED ORDER — CIPROFLOXACIN HCL 250 MG PO TABS: 250.0000 mg | ORAL_TABLET | Freq: Two times a day (BID) | ORAL | 0 refills | Status: AC

## 2020-12-30 NOTE — Progress Notes (Signed)
Acute Office Visit  Subjective:    Patient ID: Courtney [name], female    DOB: 1946/05/04, 75 y.o.   MRN: [id number]  Chief Complaint  Patient presents with   Breast Pain   Dysuria    HPI Patient is in today for follow up from hospitalization for LHC: 95% LAD: Stented. Saw Dr. Beatrix Fetters and Dr. Philbert Riser. Recommend every 3 month FLP and lipoprotein A. Started on crestor 10 mg once daily, lisinopril 5 mg once daily, and brilinta 90 mg one twice a day. Previously intolerant to lipitor and zocor. Repatha caused severe leg pain. Concerned chest pain is more related to costochondritis.   Headaches:  Nearly every day. Light bothers her. Not able to take NSAIDS. May take tylenol, but is on hydrocodone/apap from the pain clinic. Consider topiramate.   UTI: having bladder pressure. No dysuria. Sxs x 1 week.   Past Medical History:  Diagnosis Date   Anemia    Arthritis    Bronchiectasis (HCC)    Chronic idiopathic constipation    Depression, major, recurrent, moderate (HCC)    Headache    Heart murmur    History of bladder infections    History of COVID-19    Hyperlipidemia    Hypertension    Hypothyroidism    Knee pain, bilateral    Osteoarthritis    Pneumonia    Primary insomnia    Recovering alcoholic (HCC)    SVT (supraventricular tachycardia) (HCC)    UTI (urinary tract infection)    Varicose veins    Vitamin B12 deficiency     Past Surgical History:  Procedure Laterality Date   ABDOMINAL HYSTERECTOMY  1991   ANGIOPLASTY     at least 15 years ago, pt. denies    APPENDECTOMY     AUGMENTATION MAMMAPLASTY Bilateral    BACK SURGERY     had 2 surgeries. Have rods placed in back    BARIATRIC SURGERY     lap band-at least 14 years ago   BREAST BIOPSY Left    x2   BREAST ENHANCEMENT SURGERY  2006   CHOLECYSTECTOMY  12/2019   removed lap band.    COLONOSCOPY  12/01/2016   Moderate predominantly sigmoid diverticulosis.    EYE SURGERY     IOL- bilateral - [place]    KNEE ARTHROSCOPY Left    lap band surgery  1981   LUMBAR FUSION  07/29/2015   posterior level one   removal of cervical disc fragments  10/2007   pt. denies     Family History  Problem Relation Age of Onset   Heart disease Mother    Heart disease Father    Heart disease Brother    Diabetes Sister    Heart disease Brother    Heart disease Brother    Heart disease Brother    Heart disease Brother    Other Sister        BRAIN TUMOR    Social History   Socioeconomic History   Marital status: Married    Spouse name: Not on file   Number of children: 2   Years of education: Not on file   Highest education level: Not on file  Occupational History   Occupation: retired    Comment: Marine scientist  Tobacco Use   Smoking status: Never   Smokeless tobacco: Never  Vaping Use   Vaping Use: Never used  Substance and Sexual Activity   Alcohol use: No    Alcohol/week: 0.0 standard drinks  Comment: recovery x 20 yrs   Drug use: No   Sexual activity: Not Currently    Comment: MARRIED  Other Topics Concern   Not on file  Social History Narrative   Not on file   Social Determinants of Health   Financial Resource Strain: Not on file  Food Insecurity: No Food Insecurity   Worried About Running Out of Food in the Last Year: Never true   Ran Out of Food in the Last Year: Never true  Transportation Needs: No Transportation Needs   Lack of Transportation (Medical): No   Lack of Transportation (Non-Medical): No  Physical Activity: Not on file  Stress: Not on file  Social Connections: Not on file  Intimate Partner Violence: Not on file    Outpatient Medications Prior to Visit  Medication Sig Dispense Refill   lisinopril (ZESTRIL) 5 MG tablet Take 5 mg by mouth daily.     rosuvastatin (CRESTOR) 10 MG tablet Take 10 mg by mouth daily.     ticagrelor (BRILINTA) 90 MG TABS tablet Take 90 mg by mouth 2 (two) times daily.     aspirin (ASPIRIN 81) 81 MG chewable tablet Chew 81 mg by mouth  daily.     calcium carbonate (TUMS - DOSED IN MG ELEMENTAL CALCIUM) 500 MG chewable tablet Chew 1 tablet by mouth daily as needed for indigestion or heartburn.     docusate sodium (COLACE) 100 MG capsule Take 1 capsule (100 mg total) by mouth 2 (two) times daily. (Patient taking differently: Take 100 mg by mouth daily.) 60 capsule 0   fluticasone (FLONASE) 50 MCG/ACT nasal spray Place 2 sprays into both nostrils daily as needed for allergies.      furosemide (LASIX) 20 MG tablet Take 1 tablet (20 mg total) by mouth daily. 30 tablet 3   gabapentin (NEURONTIN) 600 MG tablet Take 1 tablet (600 mg total) by mouth 3 (three) times daily. 90 tablet 3   levothyroxine (SYNTHROID) 75 MCG tablet TAKE 1 TABLET(75 MCG) BY MOUTH DAILY 90 tablet 3   metoprolol tartrate (LOPRESSOR) 50 MG tablet TAKE 1 TABLET(50 MG) BY MOUTH TWICE DAILY WITH MEALS 180 tablet 1   montelukast (SINGULAIR) 10 MG tablet Take 1 tablet (10 mg total) by mouth at bedtime. 90 tablet 3   Multiple Vitamins-Minerals (MULTIVITAMIN WITH MINERALS) tablet Take 1 tablet by mouth daily.     nitroGLYCERIN (NITROSTAT) 0.4 MG SL tablet Place 1 tablet (0.4 mg total) under the tongue every 5 (five) minutes as needed for chest pain. 25 tablet 0   omeprazole (PRILOSEC) 40 MG capsule Take 1 capsule (40 mg total) by mouth 2 (two) times daily. (Patient taking differently: Take 40 mg by mouth daily as needed.) 60 capsule 30   potassium chloride SA (KLOR-CON) 20 MEQ tablet TAKE 1 TABLET(20 MEQ) BY MOUTH DAILY 30 tablet 3   tiZANidine (ZANAFLEX) 4 MG tablet Take 4 mg by mouth daily as needed.     tretinoin (RETIN-A) 0.05 % cream tretinoin 0.05 % topical cream     vitamin E (VITAMIN E) 180 MG (400 UNITS) capsule Take 400 Units by mouth daily.     No facility-administered medications prior to visit.    Allergies  Allergen Reactions   Levofloxacin Hives, Shortness Of Breath and Swelling    RASH IN MOUTH   (can take IV route)   Bactrim  [Sulfamethoxazole-Trimethoprim] Hives and Itching   Clarithromycin     GI upset   Lyrica [Pregabalin]    Macrobid [Nitrofurantoin]  Not tolerate   Statins Other (See Comments)    Myalgias    Review of Systems  Constitutional:  Negative for chills and fever.  HENT:  Negative for congestion, ear pain and sore throat.   Respiratory:  Negative for cough.   Cardiovascular:  Negative for chest pain.  Gastrointestinal:  Positive for abdominal pain. Negative for nausea and vomiting.  Genitourinary:  Positive for difficulty urinating and frequency. Negative for dysuria.       Suprapubic pressure.      Objective:    Physical Exam Vitals reviewed.  Constitutional:      Appearance: Normal appearance. Courtney [name] is normal weight.  Neck:     Vascular: No carotid bruit.  Cardiovascular:     Rate and Rhythm: Normal rate and regular rhythm.     Pulses: Normal pulses.     Heart sounds: Normal heart sounds.  Pulmonary:     Effort: Pulmonary effort is normal. No respiratory distress.     Breath sounds: Normal breath sounds.  Chest:  Breasts:    Right: No mass or tenderness.     Left: Tenderness present. No mass or nipple discharge.     Comments: BL Implants.  Abdominal:     General: Abdomen is flat. Bowel sounds are normal.     Palpations: Abdomen is soft.     Tenderness: There is no abdominal tenderness (suprapubic).  Skin:    Findings: Bruising (rt forearm of LHC.) present.  Neurological:     Mental Status: Courtney [name] is alert and oriented to person, place, and time.  Psychiatric:        Mood and Affect: Mood normal.        Behavior: Behavior normal.    BP 110/62   Pulse 60   Resp 14   Ht _0  (1.651 m)   Wt 175 lb (79.4 kg)   BMI 29.12 kg/m  Wt Readings from Last 3 Encounters:  12/30/20 175 lb (79.4 kg)  10/23/20 177 lb (80.3 kg)  09/02/20 185 lb (83.9 kg)    Health Maintenance Due  Topic Date Due   TETANUS/TDAP  Never done   Zoster Vaccines- Shingrix (1 of 2) Never done    COVID-19 Vaccine (3 - Mixed Product risk series) 09/09/2019   MAMMOGRAM  02/27/2020    There are no preventive care reminders to display for this patient.   Lab Results  Component Value Date   TSH 3.970 10/02/2019   Lab Results  Component Value Date   WBC 6.2 09/02/2020   HGB 12.2 09/02/2020   HCT 36.6 09/02/2020   MCV 91 09/02/2020   PLT 211 09/02/2020   Lab Results  Component Value Date   NA 132 (L) 09/02/2020   K 4.6 09/02/2020   CO2 21 09/02/2020   GLUCOSE 104 (H) 09/02/2020   BUN 25 09/02/2020   CREATININE 0.96 09/02/2020   BILITOT 0.4 09/02/2020   ALKPHOS 100 09/02/2020   AST 22 09/02/2020   ALT 22 09/02/2020   PROT 6.9 09/02/2020   ALBUMIN 4.6 09/02/2020   CALCIUM 10.0 09/02/2020   ANIONGAP 9 09/04/2018   EGFR 62 09/02/2020   Lab Results  Component Value Date   CHOL 164 10/02/2019   Lab Results  Component Value Date   HDL 43 10/02/2019   Lab Results  Component Value Date   LDLCALC 97 10/02/2019   Lab Results  Component Value Date   TRIG 138 10/02/2019   Lab Results  Component Value Date   CHOLHDL  3.8 10/02/2019   No results found for: HGBA1C     Assessment & Plan:  1. Mixed hyperlipidemia Well controlled.  No changes to medicines.  Continue to work on eating a healthy diet and exercise.  Labs drawn today.  - Lipid panel - TSH  2. Coronary artery disease involving native coronary artery of native heart without angina pectoris S/P LHC with stent.  Continue crestor, brilinta and lisinopril. - CBC with Differential/Platelet - Comprehensive metabolic panel - Lipoprotein A (LPA)  3. Other headache syndrome Start on topiramate 0.25 mg once daily at night x 1 week, and then increase to 2 daily at night.   4. Acute cystitis without hematuria Rx: Cipro 250 mg one twice a day x 7 days.  - POCT urinalysis dipstick - Urine Culture  5. Breast pain in female - MM Digital Diagnostic Bilat  6. Hx of breast implants, bilateral - MM Digital  Diagnostic Bilat   Meds ordered this encounter  Medications   ciprofloxacin (CIPRO) 250 MG tablet    Sig: Take 1 tablet (250 mg total) by mouth 2 (two) times daily for 7 days.    Dispense:  14 tablet    Refill:  0   topiramate (TOPAMAX) 25 MG tablet    Sig: Take 1 tablet (25 mg total) by mouth daily for 7 days, THEN 2 tablets (50 mg total) daily for 21 days.    Dispense:  60 tablet    Refill:  0    Orders Placed This Encounter  Procedures   Urine Culture   MM Digital Diagnostic Bilat   CBC with Differential/Platelet   Comprehensive metabolic panel   Lipid panel   TSH   Lipoprotein A (LPA)   POCT urinalysis dipstick    Follow-up: Return in about 3 months (around 04/01/2021) for fasting.  An After Visit Summary was printed and given to the patient.  [name], MD [facility] [phone]

## 2020-12-31 ENCOUNTER — Encounter: Payer: Self-pay | Admitting: Family Medicine

## 2020-12-31 LAB — CBC WITH DIFFERENTIAL/PLATELET
Basophils Absolute: 0.1 10*3/uL (ref 0.0–0.2)
Basos: 1 %
EOS (ABSOLUTE): 0.4 10*3/uL (ref 0.0–0.4)
Eos: 6 %
Hematocrit: 39.8 % (ref 34.0–46.6)
Hemoglobin: 13.3 g/dL (ref 11.1–15.9)
Immature Grans (Abs): 0 10*3/uL (ref 0.0–0.1)
Immature Granulocytes: 0 %
Lymphocytes Absolute: 2.1 10*3/uL (ref 0.7–3.1)
Lymphs: 31 %
MCH: 30.7 pg (ref 26.6–33.0)
MCHC: 33.4 g/dL (ref 31.5–35.7)
MCV: 92 fL (ref 79–97)
Monocytes Absolute: 0.5 10*3/uL (ref 0.1–0.9)
Monocytes: 7 %
Neutrophils Absolute: 3.7 10*3/uL (ref 1.4–7.0)
Neutrophils: 55 %
Platelets: 251 10*3/uL (ref 150–450)
RBC: 4.33 x10E6/uL (ref 3.77–5.28)
RDW: 12.9 % (ref 11.7–15.4)
WBC: 6.8 10*3/uL (ref 3.4–10.8)

## 2020-12-31 LAB — COMPREHENSIVE METABOLIC PANEL
ALT: 23 IU/L (ref 0–32)
AST: 26 IU/L (ref 0–40)
Albumin/Globulin Ratio: 1.6 (ref 1.2–2.2)
Albumin: 4.7 g/dL (ref 3.7–4.7)
Alkaline Phosphatase: 139 IU/L — ABNORMAL HIGH (ref 44–121)
BUN/Creatinine Ratio: 18 (ref 12–28)
BUN: 18 mg/dL (ref 8–27)
Bilirubin Total: 0.4 mg/dL (ref 0.0–1.2)
CO2: 26 mmol/L (ref 20–29)
Calcium: 10.1 mg/dL (ref 8.7–10.3)
Chloride: 98 mmol/L (ref 96–106)
Creatinine, Ser: 1.02 mg/dL — ABNORMAL HIGH (ref 0.57–1.00)
Globulin, Total: 2.9 g/dL (ref 1.5–4.5)
Glucose: 102 mg/dL — ABNORMAL HIGH (ref 65–99)
Potassium: 5.1 mmol/L (ref 3.5–5.2)
Sodium: 138 mmol/L (ref 134–144)
Total Protein: 7.6 g/dL (ref 6.0–8.5)
eGFR: 58 mL/min/{1.73_m2} — ABNORMAL LOW (ref >59)

## 2020-12-31 LAB — CARDIOVASCULAR RISK ASSESSMENT

## 2020-12-31 LAB — LIPID PANEL
Chol/HDL Ratio: 4 ratio (ref 0.0–4.4)
Cholesterol, Total: 202 mg/dL — ABNORMAL HIGH (ref 100–199)
HDL: 51 mg/dL (ref >39)
LDL Chol Calc (NIH): 124 mg/dL — ABNORMAL HIGH (ref 0–99)
Triglycerides: 153 mg/dL — ABNORMAL HIGH (ref 0–149)
VLDL Cholesterol Cal: 27 mg/dL (ref 5–40)

## 2020-12-31 LAB — LIPOPROTEIN A (LPA): Lipoprotein (a): 332.4 nmol/L — ABNORMAL HIGH (ref <75.0)

## 2020-12-31 LAB — TSH: TSH: 6.18 u[IU]/mL — ABNORMAL HIGH (ref 0.450–4.500)

## 2021-01-01 ENCOUNTER — Telehealth: Payer: Self-pay | Admitting: Family Medicine

## 2021-01-01 ENCOUNTER — Other Ambulatory Visit: Payer: Self-pay

## 2021-01-01 MED ORDER — LEVOTHYROXINE SODIUM 88 MCG PO TABS: 88.0000 ug | ORAL_TABLET | Freq: Every day | ORAL | 0 refills | Status: DC

## 2021-01-01 NOTE — Telephone Encounter (Signed)
   Courtney [name] has been scheduled for the following appointment:  WHAT: DIAGNOSTIC MAMMOGRAM WHERE: [facility] DATE: 01/21/21 TIME: 9:10 AM ARRIVAL TIME  Patient has been made aware.

## 2021-01-02 LAB — URINE CULTURE

## 2021-01-07 ENCOUNTER — Other Ambulatory Visit: Payer: Self-pay

## 2021-01-07 ENCOUNTER — Other Ambulatory Visit: Payer: Self-pay | Admitting: Family Medicine

## 2021-01-07 ENCOUNTER — Ambulatory Visit (INDEPENDENT_AMBULATORY_CARE_PROVIDER_SITE_OTHER): Payer: Medicare Other

## 2021-01-07 DIAGNOSIS — N3000 Acute cystitis without hematuria: Secondary | ICD-10-CM | POA: Diagnosis not present

## 2021-01-07 LAB — POCT URINALYSIS DIPSTICK
Bilirubin, UA: NEGATIVE
Blood, UA: NEGATIVE
Glucose, UA: NEGATIVE
Ketones, UA: NEGATIVE
Leukocytes, UA: NEGATIVE
Nitrite, UA: NEGATIVE
Protein, UA: NEGATIVE
Spec Grav, UA: 1.015 (ref 1.010–1.025)
Urobilinogen, UA: 0.2 E.U./dL
pH, UA: 6.5 (ref 5.0–8.0)

## 2021-01-07 MED ORDER — CIPROFLOXACIN HCL 500 MG PO TABS: 500.0000 mg | ORAL_TABLET | Freq: Two times a day (BID) | ORAL | 0 refills | Status: AC

## 2021-01-10 LAB — URINE CULTURE

## 2021-01-11 ENCOUNTER — Other Ambulatory Visit: Payer: Self-pay | Admitting: Legal Medicine

## 2021-01-11 DIAGNOSIS — N3001 Acute cystitis with hematuria: Secondary | ICD-10-CM

## 2021-01-11 MED ORDER — DOXYCYCLINE HYCLATE 100 MG PO TABS: 100.0000 mg | ORAL_TABLET | Freq: Two times a day (BID) | ORAL | 0 refills | Status: DC

## 2021-01-11 NOTE — Progress Notes (Signed)
Urine culture staph hemolyticus resistant to cipro, sensitive to doxycycline- called in lp

## 2021-01-19 DIAGNOSIS — Z955 Presence of coronary angioplasty implant and graft: Secondary | ICD-10-CM | POA: Diagnosis not present

## 2021-01-19 DIAGNOSIS — R079 Chest pain, unspecified: Secondary | ICD-10-CM | POA: Diagnosis not present

## 2021-01-19 DIAGNOSIS — I498 Other specified cardiac arrhythmias: Secondary | ICD-10-CM | POA: Diagnosis not present

## 2021-01-19 DIAGNOSIS — I110 Hypertensive heart disease with heart failure: Secondary | ICD-10-CM | POA: Diagnosis not present

## 2021-01-19 DIAGNOSIS — I872 Venous insufficiency (chronic) (peripheral): Secondary | ICD-10-CM | POA: Diagnosis not present

## 2021-01-19 DIAGNOSIS — I10 Essential (primary) hypertension: Secondary | ICD-10-CM | POA: Diagnosis not present

## 2021-01-19 DIAGNOSIS — Z7982 Long term (current) use of aspirin: Secondary | ICD-10-CM | POA: Diagnosis not present

## 2021-01-19 DIAGNOSIS — Z79899 Other long term (current) drug therapy: Secondary | ICD-10-CM | POA: Diagnosis not present

## 2021-01-19 DIAGNOSIS — I25118 Atherosclerotic heart disease of native coronary artery with other forms of angina pectoris: Secondary | ICD-10-CM | POA: Diagnosis not present

## 2021-01-19 DIAGNOSIS — I491 Atrial premature depolarization: Secondary | ICD-10-CM | POA: Diagnosis not present

## 2021-01-19 DIAGNOSIS — M791 Myalgia, unspecified site: Secondary | ICD-10-CM | POA: Diagnosis not present

## 2021-01-21 DIAGNOSIS — N644 Mastodynia: Secondary | ICD-10-CM | POA: Diagnosis not present

## 2021-01-21 DIAGNOSIS — R922 Inconclusive mammogram: Secondary | ICD-10-CM | POA: Diagnosis not present

## 2021-01-25 ENCOUNTER — Other Ambulatory Visit: Payer: Self-pay | Admitting: Family Medicine

## 2021-01-26 ENCOUNTER — Other Ambulatory Visit: Payer: Self-pay | Admitting: Family Medicine

## 2021-01-31 ENCOUNTER — Other Ambulatory Visit: Payer: Self-pay | Admitting: Nurse Practitioner

## 2021-01-31 ENCOUNTER — Telehealth: Payer: Self-pay | Admitting: Nurse Practitioner

## 2021-01-31 DIAGNOSIS — R110 Nausea: Secondary | ICD-10-CM

## 2021-01-31 DIAGNOSIS — Z8719 Personal history of other diseases of the digestive system: Secondary | ICD-10-CM

## 2021-01-31 DIAGNOSIS — R197 Diarrhea, unspecified: Secondary | ICD-10-CM

## 2021-01-31 DIAGNOSIS — R1032 Left lower quadrant pain: Secondary | ICD-10-CM

## 2021-01-31 MED ORDER — CIPROFLOXACIN HCL 500 MG PO TABS: 500.0000 mg | ORAL_TABLET | Freq: Two times a day (BID) | ORAL | 0 refills | Status: DC

## 2021-01-31 MED ORDER — ONDANSETRON 4 MG PO TBDP: 4.0000 mg | ORAL_TABLET | Freq: Three times a day (TID) | ORAL | 0 refills | Status: DC | PRN

## 2021-01-31 NOTE — Telephone Encounter (Signed)
Pt called after-hours phone, stating she has severe LLQ abd pain, bloody diarrhea, and nausea. Denies fever.States she has a past medical history of diverticulitis. Pt encouraged to go to closest ED for evaluation and CT of abd. Pt declines at this time. States she feels poorly and does not want to wait for hours in ED. Cipro and Zofran sent to [facility] per pt request. Pt encouraged to drink clear liquids and go to ED if symptoms fail to improve or worsen.

## 2021-02-01 DIAGNOSIS — Z79899 Other long term (current) drug therapy: Secondary | ICD-10-CM | POA: Diagnosis not present

## 2021-02-01 DIAGNOSIS — K5793 Diverticulitis of intestine, part unspecified, without perforation or abscess with bleeding: Secondary | ICD-10-CM | POA: Diagnosis present

## 2021-02-01 DIAGNOSIS — R935 Abnormal findings on diagnostic imaging of other abdominal regions, including retroperitoneum: Secondary | ICD-10-CM | POA: Diagnosis not present

## 2021-02-01 DIAGNOSIS — K625 Hemorrhage of anus and rectum: Secondary | ICD-10-CM | POA: Diagnosis not present

## 2021-02-01 DIAGNOSIS — Z882 Allergy status to sulfonamides status: Secondary | ICD-10-CM | POA: Diagnosis not present

## 2021-02-01 DIAGNOSIS — Z7902 Long term (current) use of antithrombotics/antiplatelets: Secondary | ICD-10-CM | POA: Diagnosis not present

## 2021-02-01 DIAGNOSIS — Z79890 Hormone replacement therapy (postmenopausal): Secondary | ICD-10-CM | POA: Diagnosis not present

## 2021-02-01 DIAGNOSIS — Z9049 Acquired absence of other specified parts of digestive tract: Secondary | ICD-10-CM | POA: Diagnosis not present

## 2021-02-01 DIAGNOSIS — Z8249 Family history of ischemic heart disease and other diseases of the circulatory system: Secondary | ICD-10-CM | POA: Diagnosis not present

## 2021-02-01 DIAGNOSIS — R1084 Generalized abdominal pain: Secondary | ICD-10-CM | POA: Diagnosis not present

## 2021-02-01 DIAGNOSIS — I959 Hypotension, unspecified: Secondary | ICD-10-CM | POA: Diagnosis not present

## 2021-02-01 DIAGNOSIS — K921 Melena: Secondary | ICD-10-CM | POA: Diagnosis present

## 2021-02-01 DIAGNOSIS — K922 Gastrointestinal hemorrhage, unspecified: Secondary | ICD-10-CM | POA: Diagnosis not present

## 2021-02-01 DIAGNOSIS — Z881 Allergy status to other antibiotic agents status: Secondary | ICD-10-CM | POA: Diagnosis not present

## 2021-02-01 DIAGNOSIS — K5730 Diverticulosis of large intestine without perforation or abscess without bleeding: Secondary | ICD-10-CM | POA: Diagnosis not present

## 2021-02-01 DIAGNOSIS — I2510 Atherosclerotic heart disease of native coronary artery without angina pectoris: Secondary | ICD-10-CM | POA: Diagnosis not present

## 2021-02-01 DIAGNOSIS — J479 Bronchiectasis, uncomplicated: Secondary | ICD-10-CM | POA: Diagnosis present

## 2021-02-01 DIAGNOSIS — Z7982 Long term (current) use of aspirin: Secondary | ICD-10-CM | POA: Diagnosis not present

## 2021-02-01 DIAGNOSIS — E785 Hyperlipidemia, unspecified: Secondary | ICD-10-CM | POA: Diagnosis present

## 2021-02-01 DIAGNOSIS — Z888 Allergy status to other drugs, medicaments and biological substances status: Secondary | ICD-10-CM | POA: Diagnosis not present

## 2021-02-01 DIAGNOSIS — D62 Acute posthemorrhagic anemia: Secondary | ICD-10-CM | POA: Diagnosis not present

## 2021-02-01 DIAGNOSIS — Z90710 Acquired absence of both cervix and uterus: Secondary | ICD-10-CM | POA: Diagnosis not present

## 2021-02-01 DIAGNOSIS — Z95811 Presence of heart assist device: Secondary | ICD-10-CM | POA: Diagnosis not present

## 2021-02-01 DIAGNOSIS — I10 Essential (primary) hypertension: Secondary | ICD-10-CM | POA: Diagnosis present

## 2021-02-01 DIAGNOSIS — I9589 Other hypotension: Secondary | ICD-10-CM | POA: Diagnosis present

## 2021-02-01 DIAGNOSIS — E039 Hypothyroidism, unspecified: Secondary | ICD-10-CM | POA: Diagnosis not present

## 2021-02-01 DIAGNOSIS — D6489 Other specified anemias: Secondary | ICD-10-CM | POA: Diagnosis not present

## 2021-02-01 DIAGNOSIS — K6389 Other specified diseases of intestine: Secondary | ICD-10-CM | POA: Diagnosis not present

## 2021-02-01 DIAGNOSIS — N178 Other acute kidney failure: Secondary | ICD-10-CM | POA: Diagnosis present

## 2021-02-01 DIAGNOSIS — R109 Unspecified abdominal pain: Secondary | ICD-10-CM | POA: Diagnosis not present

## 2021-02-01 DIAGNOSIS — R933 Abnormal findings on diagnostic imaging of other parts of digestive tract: Secondary | ICD-10-CM | POA: Diagnosis not present

## 2021-02-01 DIAGNOSIS — N179 Acute kidney failure, unspecified: Secondary | ICD-10-CM | POA: Diagnosis not present

## 2021-02-01 DIAGNOSIS — Z955 Presence of coronary angioplasty implant and graft: Secondary | ICD-10-CM | POA: Diagnosis not present

## 2021-02-03 ENCOUNTER — Ambulatory Visit: Payer: Medicare Other | Admitting: Family Medicine

## 2021-02-04 ENCOUNTER — Other Ambulatory Visit: Payer: Self-pay

## 2021-02-04 ENCOUNTER — Ambulatory Visit (INDEPENDENT_AMBULATORY_CARE_PROVIDER_SITE_OTHER): Payer: Medicare Other | Admitting: Pulmonary Disease

## 2021-02-04 ENCOUNTER — Encounter: Payer: Self-pay | Admitting: Pulmonary Disease

## 2021-02-04 ENCOUNTER — Telehealth: Payer: Self-pay

## 2021-02-04 VITALS — BP 112/64 | HR 75 | Ht 65.0 in | Wt 174.2 lb

## 2021-02-04 DIAGNOSIS — J479 Bronchiectasis, uncomplicated: Secondary | ICD-10-CM

## 2021-02-04 DIAGNOSIS — I2510 Atherosclerotic heart disease of native coronary artery without angina pectoris: Secondary | ICD-10-CM

## 2021-02-04 MED ORDER — FLUTICASONE FUROATE-VILANTEROL 200-25 MCG/INH IN AEPB: 1.0000 | INHALATION_SPRAY | Freq: Every day | RESPIRATORY_TRACT | 2 refills | Status: DC

## 2021-02-04 NOTE — Telephone Encounter (Signed)
  Transition Care Management Follow-up Telephone Call    Courtney [name] Feb 25, 1946  Admit Date: 02/01/21 Discharge Date: 02/02/21 Discharged from where: [facility]  Diagnoses: Gastrointestinal hemorrhage with melena (K92.1)  2 day post discharge: 02/04/21 7 day post discharge: 02/11/21 14 day post discharge: 02/18/21  Courtney [name] was discharged from [facility] on 02/02/21 with the diagnoses listed above.  She was contacted today via telephone in regards to transition of care.  Patient states that she is doing well since her discharge.  Patient was admitted after complaints of bright red blood in stool and abdominal pain/nausea.  CT abdomen & pelvis resulted defined thickening of the sigmoid colon  Hemoglobin dropped and stayed stable at 10.2, INR and Platelet WNL  Creatinine of 2.62 on admission - resolved with IV Fluid   Discharge Instructions: Follow-up with PCP in one week to recheck HGB and consider outpatient colonoscopy.    HOLD POTASSIUM until PCP follow-up  Items Reviewed: Did the pt receive and understand the discharge instructions provided? Yes  Medications obtained and verified? Yes  Any new allergies since your discharge? No  Dietary orders reviewed? Yes Do you have support at home? Yes   Home Care and Equipment/Supplies: Were home health services ordered? no Were any new equipment or medical supplies ordered?  No  Functional Questionnaire: (I = Independent and D = Dependent) ADLs: I  Bathing/Dressing- I  Meal Prep- I  Eating- I  Maintaining continence- I  Transferring/Ambulation- I  Managing Meds- I  Any patient concerns? no  Follow up appointments reviewed: PCP Hospital f/u appt confirmed? Yes  Scheduled to see Dr Tobie Poet on 02/08/21 @ 2 pm. Are transportation arrangements needed? No  If their condition worsens, is the pt aware to call PCP or go to the Emergency Dept.? Yes Was the patient provided with contact information for the PCP's  office/after hours number? Yes Was to pt encouraged to call back with questions or concerns? Yes    Shelle Iron, LPN 72/09/47 0:96 PM

## 2021-02-04 NOTE — Patient Instructions (Signed)
We will restart breo inhaler Order high-resolution CT and PFTs at next checkup will Follow-up in clinic after these tests.

## 2021-02-04 NOTE — Progress Notes (Signed)
Courtney [name]    [id number]    10-14-1945  Primary Care Physician:[name], Elnita Maxwell, MD  Referring Physician: [name], MD [address],  [place] [address]  Chief complaint: Follow-up for recurrent pneumonia, bronchiectasis  HPI: 75 year old with history of SVT, hyperlipidemia, allergies, GERD She has episodes of recurrent bronchiectasis, pneumonia.  Treated with antibiotics over the past few months.  Continues to have cough with congestion, wheezing, dyspnea.  Also has significant symptoms of postnasal drip, rhinitis. Sent to pulmonary clinic for evaluation of recurrent bronchitis, suspected bronchiectasis.  Over the past year in 2020  has had recurrent attacks of bronchitis/bronchiectasis exacerbation treated with antibiotics by primary care. Reports testing positive for Covid in September 2020 but did not need hospitaliation.  Also has recurrent UTIs for which she was given prolonged course of nitrofurantoin in 2020. Given a trial of prednisone in 2021  She did not tolerated it well at all with side effects of rash, mood changes, insomnia and stopped it after 9 days.  She did not tolerate Bactrim for PCP prophylaxis as well.  Pets: No pets Occupation: Retired Marine scientist Exposures: No known exposures, no mold, hot tub, Jacuzzi.  Has a down comforter which she got rid off on 09/13/2019 ILD exposure questionnaire 10/14/2019 significant for nitrofurantoin in 2020 and down comforter.  Stopped exposure to down in 2021 Smoking history: Never smoker Travel history: Likes to travel around the country in an RV Relevant family history: Brother and sister have asthma.  Interim history: States that breathing is stable without issue next She was ordered for a follow-up CT in 2021 but did not follow through with COVID and then her husband got sick  She had a stent placed for coronary artery disease in July 2022.  Developed lower GI bleed due to Brilinta with admission at  [facility] earlier this month.  She is being followed by GI and cardiology with close monitoring  Outpatient Encounter Medications as of 02/04/2021  Medication Sig   aspirin (ASPIRIN 81) 81 MG chewable tablet Chew 81 mg by mouth daily.   calcium carbonate (TUMS - DOSED IN MG ELEMENTAL CALCIUM) 500 MG chewable tablet Chew 1 tablet by mouth daily as needed for indigestion or heartburn.   docusate sodium (COLACE) 100 MG capsule Take 1 capsule (100 mg total) by mouth 2 (two) times daily. (Patient taking differently: Take 100 mg by mouth daily.)   fluticasone (FLONASE) 50 MCG/ACT nasal spray Place 2 sprays into both nostrils daily as needed for allergies.    furosemide (LASIX) 20 MG tablet TAKE 1 TABLET(20 MG) BY MOUTH DAILY   gabapentin (NEURONTIN) 600 MG tablet Take 1 tablet (600 mg total) by mouth 3 (three) times daily.   levothyroxine (SYNTHROID) 88 MCG tablet Take 1 tablet (88 mcg total) by mouth daily before breakfast.   lisinopril (ZESTRIL) 5 MG tablet Take 5 mg by mouth daily.   metoprolol tartrate (LOPRESSOR) 50 MG tablet TAKE 1 TABLET(50 MG) BY MOUTH TWICE DAILY WITH MEALS   montelukast (SINGULAIR) 10 MG tablet Take 1 tablet (10 mg total) by mouth at bedtime.   Multiple Vitamins-Minerals (MULTIVITAMIN WITH MINERALS) tablet Take 1 tablet by mouth daily.   nitroGLYCERIN (NITROSTAT) 0.4 MG SL tablet Place 1 tablet (0.4 mg total) under the tongue every 5 (five) minutes as needed for chest pain.   omeprazole (PRILOSEC) 40 MG capsule Take 1 capsule (40 mg total) by mouth 2 (two) times daily. (Patient taking  differently: Take 40 mg by mouth daily as needed.)   ondansetron (ZOFRAN ODT) 4 MG disintegrating tablet Take 1 tablet (4 mg total) by mouth every 8 (eight) hours as needed for nausea or vomiting.   potassium chloride SA (KLOR-CON) 20 MEQ tablet TAKE 1 TABLET(20 MEQ) BY MOUTH DAILY   rosuvastatin (CRESTOR) 10 MG tablet Take 10 mg by mouth daily.   ticagrelor (BRILINTA) 90 MG TABS tablet Take 90  mg by mouth 2 (two) times daily.   tiZANidine (ZANAFLEX) 4 MG tablet Take 4 mg by mouth daily as needed.   topiramate (TOPAMAX) 25 MG tablet Take 1 tablet (25 mg total) by mouth daily for 7 days, THEN 2 tablets (50 mg total) daily for 21 days.   tretinoin (RETIN-A) 0.05 % cream tretinoin 0.05 % topical cream   vitamin E (VITAMIN E) 180 MG (400 UNITS) capsule Take 400 Units by mouth daily.   [DISCONTINUED] ciprofloxacin (CIPRO) 500 MG tablet Take 1 tablet (500 mg total) by mouth 2 (two) times daily for 10 days.   [DISCONTINUED] doxycycline (VIBRA-TABS) 100 MG tablet Take 1 tablet (100 mg total) by mouth 2 (two) times daily.   No facility-administered encounter medications on file as of 02/04/2021.   Physical Exam: Blood pressure 112/64, pulse 75, height 5\' 5"  (1.651 m), weight 174 lb 3.2 oz (79 kg), SpO2 99 %. Gen:      No acute distress HEENT:  EOMI, sclera anicteric Neck:     No masses; no thyromegaly Lungs:    Mild basal crackles CV:         Regular rate and rhythm; no murmurs Abd:      + bowel sounds; soft, non-tender; no palpable masses, no distension Ext:    No edema; adequate peripheral perfusion Skin:      Warm and dry; no rash Neuro: alert and oriented x 3 Psych: normal mood and affect   Data Reviewed: Imaging: CT angio 01/30/18- patchy multifocal groundglass opacities, nodular densities bilaterally left greater than right.  Patulous esophagus with mild thickening.  I have reviewed the images personally.  High-resolution CT 04/02/2018- mid and lower lung groundglass opacities, mild bronchiectasis and nodularity.  Aortic atherosclerosis, coronary artery calcification I reviewed the images personally.  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 I have reviewed the images personally.  PFTs: 03/21/2018 FVC 2.53 [90%), FEV1 2.29 [99%), F/F 90, TLC 85%, DLCO 87% Normal test  FENO  03/20/2018-24  Labs: CBC from primary care 03/05/2018-WBC 7, granulocytes 67%, lymphocytes 26%. 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  I had recommended a bronchoscopy for further evaluation with BAL and transbronchial biopsy but patient and her daughter in clinic  were reluctant to undergo an invasive procedure.  Has not tolerated a trial of prednisone  We have done allergens avoidance getting rid of the down comforter and nitrofurantoin. Repeat CT has been delayed due to COVID and other health issues in the  family.  She will need CT and PFTs ordered If her ILD is progressive then will revisit question of bronchoscope versus surgical lung biopsy.  Will resume Breo inhaler as she has been off it for some time Encouraged to use flutter valve and Mucinex for mucociliary clearance  GERD, ? aspiration History noted for gastric lap band, GERD and esophagitis on recent CT scan.  She may have esophageal dysfunction and recurrent aspiration that is causing the basal fibrosis. She is planning to follow up with her surgeon to get her gastric lap band removed Consider barium swallow if she continues to have symptoms  Cough, postnasal drip Continue chlorphentermine, Flonase nasal spray  Plan/Recommendations: Resume  Breo High-res CT, PFTs at next available Return to clinic in 3 months  This appointment required 45 minutes of patient care (this includes precharting, chart review, review of results, face-to-face care, etc.).  [name] MD [facility] 02/04/2021, 11:03 AM  CC: [name], MD

## 2021-02-08 ENCOUNTER — Other Ambulatory Visit: Payer: Self-pay

## 2021-02-08 ENCOUNTER — Encounter: Payer: Self-pay | Admitting: Family Medicine

## 2021-02-08 ENCOUNTER — Ambulatory Visit (INDEPENDENT_AMBULATORY_CARE_PROVIDER_SITE_OTHER): Payer: Medicare Other | Admitting: Family Medicine

## 2021-02-08 VITALS — BP 118/70 | HR 78 | Temp 97.2°F | Ht 65.0 in | Wt 171.0 lb

## 2021-02-08 DIAGNOSIS — K529 Noninfective gastroenteritis and colitis, unspecified: Secondary | ICD-10-CM | POA: Insufficient documentation

## 2021-02-08 DIAGNOSIS — R1013 Epigastric pain: Secondary | ICD-10-CM | POA: Insufficient documentation

## 2021-02-08 DIAGNOSIS — K921 Melena: Secondary | ICD-10-CM | POA: Diagnosis not present

## 2021-02-08 DIAGNOSIS — E038 Other specified hypothyroidism: Secondary | ICD-10-CM | POA: Diagnosis not present

## 2021-02-08 HISTORY — DX: Noninfective gastroenteritis and colitis, unspecified: K52.9

## 2021-02-08 HISTORY — DX: Melena: K92.1

## 2021-02-08 MED ORDER — OMEPRAZOLE 40 MG PO CPDR: 40.0000 mg | DELAYED_RELEASE_CAPSULE | Freq: Two times a day (BID) | ORAL | 0 refills | Status: DC

## 2021-02-08 NOTE — Progress Notes (Signed)
Subjective:  Patient ID: Courtney [name], female    DOB: December 14, 1945  Age: 75 y.o. MRN: [id number]  Chief Complaint  Patient presents with   Hospitalization Follow-up    HPI Pt presents for follow up of admission from 02/01/21-02/02/2021 to [facility] diagnosed with GI hemorrhage with melena. CT scan of abdomen and pelvis showed defined thickening of sigmoid colon. Presenting complaints of bright red blood in stool and abdominal pain/nausea. Hb 10.2 and creatinine of 2.62  which resolved with IVFs.  Potassium was held. Pt was not continued on antibiotics. Pt restarted cipro after discharge. Given protonix while admitted. Recommend follow up with GI, but per patient cardiology does not want her to have a colonoscopy for 6 months from starting on brilinta.   Hx of h. Pylori.  Diarrhea has resolved. Abdominal pain has improved, but still presetn.  Current Outpatient Medications on File Prior to Visit  Medication Sig Dispense Refill   albuterol (VENTOLIN HFA) 108 (90 Base) MCG/ACT inhaler Inhale into the lungs.     aspirin 81 MG chewable tablet Chew 81 mg by mouth daily.     calcium carbonate (TUMS - DOSED IN MG ELEMENTAL CALCIUM) 500 MG chewable tablet Chew 1 tablet by mouth daily as needed for indigestion or heartburn.     dicyclomine (BENTYL) 20 MG tablet Take by mouth.     docusate sodium (COLACE) 100 MG capsule Take 1 capsule (100 mg total) by mouth 2 (two) times daily. (Patient taking differently: Take 100 mg by mouth daily.) 60 capsule 0   fluticasone (FLONASE) 50 MCG/ACT nasal spray Place 2 sprays into both nostrils daily as needed for allergies.      fluticasone furoate-vilanterol (BREO ELLIPTA) 200-25 MCG/INH AEPB Inhale 1 puff into the lungs daily. 60 each 2   furosemide (LASIX) 20 MG tablet TAKE 1 TABLET(20 MG) BY MOUTH DAILY 30 tablet 3   gabapentin (NEURONTIN) 600 MG tablet Take 1 tablet (600 mg total) by mouth 3 (three) times daily. 90 tablet 3   HYDROcodone-acetaminophen  (NORCO) 10-325 MG tablet Take by mouth.     levothyroxine (SYNTHROID) 88 MCG tablet Take 1 tablet (88 mcg total) by mouth daily before breakfast. 90 tablet 0   lisinopril (ZESTRIL) 5 MG tablet Take 5 mg by mouth daily.     metoprolol tartrate (LOPRESSOR) 50 MG tablet TAKE 1 TABLET(50 MG) BY MOUTH TWICE DAILY WITH MEALS 180 tablet 1   montelukast (SINGULAIR) 10 MG tablet Take 1 tablet (10 mg total) by mouth at bedtime. 90 tablet 3   Multiple Vitamins-Minerals (MULTIVITAMIN WITH MINERALS) tablet Take 1 tablet by mouth daily.     nitroGLYCERIN (NITROSTAT) 0.4 MG SL tablet Place 1 tablet (0.4 mg total) under the tongue every 5 (five) minutes as needed for chest pain. 25 tablet 0   ondansetron (ZOFRAN ODT) 4 MG disintegrating tablet Take 1 tablet (4 mg total) by mouth every 8 (eight) hours as needed for nausea or vomiting. 20 tablet 0   potassium chloride SA (KLOR-CON) 20 MEQ tablet TAKE 1 TABLET(20 MEQ) BY MOUTH DAILY 30 tablet 3   rosuvastatin (CRESTOR) 10 MG tablet Take 10 mg by mouth daily.     ticagrelor (BRILINTA) 90 MG TABS tablet Take 90 mg by mouth 2 (two) times daily.     tiZANidine (ZANAFLEX) 4 MG tablet Take 4 mg by mouth daily as needed.     topiramate (TOPAMAX) 25 MG tablet Take 1 tablet (25 mg total) by mouth daily for 7 days, THEN  2 tablets (50 mg total) daily for 21 days. 60 tablet 0   tretinoin (RETIN-A) 0.05 % cream tretinoin 0.05 % topical cream     vitamin E 180 MG (400 UNITS) capsule Take 400 Units by mouth daily.     No current facility-administered medications on file prior to visit.   Past Medical History:  Diagnosis Date   Anemia    Arthritis    Bronchiectasis (HCC)    Chronic idiopathic constipation    Depression, major, recurrent, moderate (HCC)    Headache    Heart murmur    History of bladder infections    History of COVID-19    Hyperlipidemia    Hypertension    Hypothyroidism    Knee pain, bilateral    Osteoarthritis    Pneumonia    Primary insomnia     Recovering alcoholic (HCC)    SVT (supraventricular tachycardia) (HCC)    UTI (urinary tract infection)    Varicose veins    Vitamin B12 deficiency    Past Surgical History:  Procedure Laterality Date   ABDOMINAL HYSTERECTOMY  1991   ANGIOPLASTY     at least 15 years ago, pt. denies    APPENDECTOMY     AUGMENTATION MAMMAPLASTY Bilateral    BACK SURGERY     had 2 surgeries. Have rods placed in back    BARIATRIC SURGERY     lap band-at least 14 years ago   BREAST BIOPSY Left    x2   BREAST ENHANCEMENT SURGERY  2006   CHOLECYSTECTOMY  12/2019   removed lap band.    COLONOSCOPY  12/01/2016   Moderate predominantly sigmoid diverticulosis.    EYE SURGERY     IOL- bilateral - [place]   KNEE ARTHROSCOPY Left    lap band surgery  1981   LUMBAR FUSION  07/29/2015   posterior level one   removal of cervical disc fragments  10/2007   pt. denies     Family History  Problem Relation Age of Onset   Heart disease Mother    Heart disease Father    Heart disease Brother    Diabetes Sister    Heart disease Brother    Heart disease Brother    Heart disease Brother    Heart disease Brother    Other Sister        BRAIN TUMOR   Social History   Socioeconomic History   Marital status: Married    Spouse name: Not on file   Number of children: 2   Years of education: Not on file   Highest education level: Not on file  Occupational History   Occupation: retired    Comment: Marine scientist  Tobacco Use   Smoking status: Never   Smokeless tobacco: Never  Scientific laboratory technician Use: Never used  Substance and Sexual Activity   Alcohol use: No    Alcohol/week: 0.0 standard drinks    Comment: recovery x 20 yrs   Drug use: No   Sexual activity: Not Currently    Comment: MARRIED  Other Topics Concern   Not on file  Social History Narrative   Not on file   Social Determinants of Health   Financial Resource Strain: Not on file  Food Insecurity: No Food Insecurity   Worried About Running  Out of Food in the Last Year: Never true   [place] in the Last Year: Never true  Transportation Needs: No Transportation Needs   Lack of Transportation (  Medical): No   Lack of Transportation (Non-Medical): No  Physical Activity: Not on file  Stress: Not on file  Social Connections: Not on file    Review of Systems  Constitutional:  Positive for fatigue. Negative for chills and fever.  HENT:  Negative for congestion, ear pain, rhinorrhea and sore throat.   Respiratory:  Positive for shortness of breath. Negative for cough.   Cardiovascular:  Negative for chest pain.  Gastrointestinal:  Positive for abdominal pain, constipation and nausea. Negative for diarrhea and vomiting.  Genitourinary:  Negative for dysuria and urgency.  Musculoskeletal:  Positive for arthralgias, back pain and myalgias.  Neurological:  Positive for weakness and headaches. Negative for dizziness and light-headedness.  Psychiatric/Behavioral:  Negative for dysphoric mood. The patient is not nervous/anxious.     Objective:  BP 118/70   Pulse 78   Temp (!) 97.2 F (36.2 C)   Ht 5\' 5"  (1.651 m)   Wt 171 lb (77.6 kg)   SpO2 98%   BMI 28.46 kg/m   BP/Weight 02/08/2021 02/04/2021 0/98/1191  Systolic BP 478 295 621  Diastolic BP 70 64 62  Wt. (Lbs) 171 174.2 175  BMI 28.46 28.99 29.12    Physical Exam Vitals reviewed.  Constitutional:      Appearance: Normal appearance. She is normal weight.  Cardiovascular:     Rate and Rhythm: Normal rate and regular rhythm.     Heart sounds: Normal heart sounds.  Pulmonary:     Effort: Pulmonary effort is normal. No respiratory distress.     Breath sounds: Normal breath sounds.  Abdominal:     General: Abdomen is flat. Bowel sounds are normal.     Palpations: Abdomen is soft.     Tenderness: There is abdominal tenderness (epigastric and periumbilical.). There is no guarding or rebound.  Neurological:     Mental Status: She is alert and oriented to person,  place, and time.  Psychiatric:        Mood and Affect: Mood normal.        Behavior: Behavior normal.    Diabetic Foot Exam - Simple   No data filed      Lab Results  Component Value Date   WBC 6.8 12/30/2020   HGB 13.3 12/30/2020   HCT 39.8 12/30/2020   PLT 251 12/30/2020   GLUCOSE 102 (H) 12/30/2020   CHOL 202 (H) 12/30/2020   TRIG 153 (H) 12/30/2020   HDL 51 12/30/2020   LDLCALC 124 (H) 12/30/2020   ALT 23 12/30/2020   AST 26 12/30/2020   NA 138 12/30/2020   K 5.1 12/30/2020   CL 98 12/30/2020   CREATININE 1.02 (H) 12/30/2020   BUN 18 12/30/2020   CO2 26 12/30/2020   TSH 6.180 (H) 12/30/2020      Assessment & Plan:   1. Epigastric abdominal pain Continue omeprazole.  - omeprazole (PRILOSEC) 40 MG capsule; Take 1 capsule (40 mg total) by mouth 2 (two) times daily.  Dispense: 1 capsule; Refill: 0  2. Colitis, acute - Comprehensive metabolic panel - omeprazole (PRILOSEC) 40 MG capsule; Take 1 capsule (40 mg total) by mouth 2 (two) times daily.  Dispense: 1 capsule; Refill: 0 - Ambulatory referral to GI.  3. Melena - CBC with Differential/Platelet - Ambulatory referral to Gastroenterology  4. Secondary hypothyroidism - TSH   Meds ordered this encounter  Medications   omeprazole (PRILOSEC) 40 MG capsule    Sig: Take 1 capsule (40 mg total) by mouth 2 (two)  times daily.    Dispense:  1 capsule    Refill:  0     Orders Placed This Encounter  Procedures   CBC with Differential/Platelet   Comprehensive metabolic panel   TSH   Ambulatory referral to Gastroenterology      Follow-up: Return in about 8 weeks (around 04/05/2021) for fasting for chronic medical issues. .  An After Visit Summary was printed and given to the patient.  [name], MD [facility] [phone]

## 2021-02-09 ENCOUNTER — Other Ambulatory Visit: Payer: Self-pay | Admitting: Family Medicine

## 2021-02-09 LAB — COMPREHENSIVE METABOLIC PANEL
ALT: 25 IU/L (ref 0–32)
AST: 24 IU/L (ref 0–40)
Albumin/Globulin Ratio: 2 (ref 1.2–2.2)
Albumin: 4.6 g/dL (ref 3.7–4.7)
Alkaline Phosphatase: 131 IU/L — ABNORMAL HIGH (ref 44–121)
BUN/Creatinine Ratio: 15 (ref 12–28)
BUN: 13 mg/dL (ref 8–27)
Bilirubin Total: 0.2 mg/dL (ref 0.0–1.2)
CO2: 23 mmol/L (ref 20–29)
Calcium: 10 mg/dL (ref 8.7–10.3)
Chloride: 98 mmol/L (ref 96–106)
Creatinine, Ser: 0.88 mg/dL (ref 0.57–1.00)
Globulin, Total: 2.3 g/dL (ref 1.5–4.5)
Glucose: 104 mg/dL — ABNORMAL HIGH (ref 65–99)
Potassium: 4.4 mmol/L (ref 3.5–5.2)
Sodium: 138 mmol/L (ref 134–144)
Total Protein: 6.9 g/dL (ref 6.0–8.5)
eGFR: 69 mL/min/{1.73_m2} (ref >59)

## 2021-02-09 LAB — CBC WITH DIFFERENTIAL/PLATELET
Basophils Absolute: 0.1 10*3/uL (ref 0.0–0.2)
Basos: 1 %
EOS (ABSOLUTE): 0.2 10*3/uL (ref 0.0–0.4)
Eos: 3 %
Hematocrit: 37.8 % (ref 34.0–46.6)
Hemoglobin: 12.4 g/dL (ref 11.1–15.9)
Immature Grans (Abs): 0.1 10*3/uL (ref 0.0–0.1)
Immature Granulocytes: 1 %
Lymphocytes Absolute: 2.3 10*3/uL (ref 0.7–3.1)
Lymphs: 30 %
MCH: 30.8 pg (ref 26.6–33.0)
MCHC: 32.8 g/dL (ref 31.5–35.7)
MCV: 94 fL (ref 79–97)
Monocytes Absolute: 0.6 10*3/uL (ref 0.1–0.9)
Monocytes: 8 %
Neutrophils Absolute: 4.4 10*3/uL (ref 1.4–7.0)
Neutrophils: 57 %
Platelets: 294 10*3/uL (ref 150–450)
RBC: 4.03 x10E6/uL (ref 3.77–5.28)
RDW: 12.6 % (ref 11.7–15.4)
WBC: 7.7 10*3/uL (ref 3.4–10.8)

## 2021-02-09 LAB — TSH: TSH: 1.63 u[IU]/mL (ref 0.450–4.500)

## 2021-02-09 MED ORDER — TOPIRAMATE 50 MG PO TABS: 50.0000 mg | ORAL_TABLET | Freq: Every day | ORAL | 0 refills | Status: DC

## 2021-02-11 ENCOUNTER — Other Ambulatory Visit: Payer: Self-pay

## 2021-02-11 ENCOUNTER — Ambulatory Visit (INDEPENDENT_AMBULATORY_CARE_PROVIDER_SITE_OTHER): Payer: Medicare Other | Admitting: Pulmonary Disease

## 2021-02-11 DIAGNOSIS — J479 Bronchiectasis, uncomplicated: Secondary | ICD-10-CM | POA: Diagnosis not present

## 2021-02-11 LAB — PULMONARY FUNCTION TEST
DL/VA % pred: 97 %
DL/VA: 4.03 ml/min/mmHg/L
DLCO cor % pred: 82 %
DLCO cor: 15.36 ml/min/mmHg
DLCO unc % pred: 80 %
DLCO unc: 14.87 ml/min/mmHg
FEF 25-75 Post: 3.5 L/sec
FEF 25-75 Pre: 3.2 L/sec
FEF2575-%Change-Post: 9 %
FEF2575-%Pred-Post: 211 %
FEF2575-%Pred-Pre: 193 %
FEV1-%Change-Post: 5 %
FEV1-%Pred-Post: 104 %
FEV1-%Pred-Pre: 99 %
FEV1-Post: 2.14 L
FEV1-Pre: 2.04 L
FEV1FVC-%Change-Post: -3 %
FEV1FVC-%Pred-Pre: 119 %
FEV6-%Change-Post: 8 %
FEV6-%Pred-Post: 94 %
FEV6-%Pred-Pre: 87 %
FEV6-Post: 2.46 L
FEV6-Pre: 2.27 L
FEV6FVC-%Pred-Post: 105 %
FEV6FVC-%Pred-Pre: 105 %
FVC-%Change-Post: 8 %
FVC-%Pred-Post: 90 %
FVC-%Pred-Pre: 83 %
FVC-Post: 2.46 L
FVC-Pre: 2.27 L
Post FEV1/FVC ratio: 87 %
Post FEV6/FVC ratio: 100 %
Pre FEV1/FVC ratio: 90 %
Pre FEV6/FVC Ratio: 100 %
RV % pred: 71 %
RV: 1.59 L
TLC % pred: 81 %
TLC: 4 L

## 2021-02-11 NOTE — Progress Notes (Signed)
PFT done today. 

## 2021-02-13 ENCOUNTER — Other Ambulatory Visit: Payer: Medicare Other

## 2021-02-14 ENCOUNTER — Other Ambulatory Visit: Payer: Self-pay | Admitting: Family Medicine

## 2021-02-16 ENCOUNTER — Encounter: Payer: Self-pay | Admitting: *Deleted

## 2021-02-18 ENCOUNTER — Telehealth: Payer: Self-pay | Admitting: *Deleted

## 2021-02-18 NOTE — Telephone Encounter (Signed)
Received fax from the pharmacy stating that the insurance will not cover the Breo 200 that the pt was prescribed.  They did not list any alternatives for the pt.  PM please advise. Thanks

## 2021-02-24 ENCOUNTER — Telehealth: Payer: Self-pay

## 2021-02-24 NOTE — Chronic Care Management (AMB) (Addendum)
Chronic Care Management Pharmacy Assistant   Name: Courtney [name]  MRN: [id number] DOB: 1946-02-10  Reason for Encounter: Cholesterol Disease State Call  Recent office visits:  02/09/21- Orders Only- Topamax 50 mg daily  08/22/22[name], MD- seen for hospitalization follow up, labs ordered, referral to gastroenterology, no medication changes, follow up 8 weeks  01/31/21- Orders Only- Ciprofloxacin 500 mg bid x 10 days; zofran 4 mg q8h prn  01/11/21- Orders Only- doxycycline 100 mg x 7 days  01/07/21- Orders Only- ciprofloxacin 500 mg x 7 days  12/30/20- [name], MD- seen for chronic conditions, short course ciprofloxacin 250 mg x 7 days, started topiramate 25 mg as directed, labs ordered, mammogram ordered, follow up 3 months   Recent consult visits:  02/04/21- [name], MD (Pulmonology)- seen for follow up of recurrent pneumonia,  started [facility], CT ordered, PFT ordered, follow up 3 months  01/19/21- Wells Guiles, NP (Cardiology)- seen for chest pain syndrome, hold crestor for 2 weeks then -will start rosuvastatin 10 mg one tablet weekly for 1-2 weeks and increase one day per week. Advised to return to prior dosing if symptoms occur, follow up 3 months  12/28/20- Martinique Tannenbaum, MD ( Cardiology)- seen for initial visit, no medication changes, follow up 3 months  12/22/20- Roselyn Bering, MD ([facility])- seen for left heart cath with coronary angiography, started lisinopril 5 mg daily, started crestor 10 mg daily, started brilinta  90 mg one tab twice daily, discontinued ibuprofen, discontinued lipitor,  follow up 1 week 12/18/20- Christian Bowlin, PA-C (Pain Medicine Telemedicine)-seen for management of chronic pain, no medication changes, follow up 3 months  Hospital visits:  Medication Reconciliation was completed by comparing discharge summary, patient's EMR and Pharmacy list, and upon discussion with patient.  Admitted to the hospital on  02/01/21 due to rectal bleeding. Discharge date was 02/02/21. Discharged from [facility]   Medications Discontinued at [facility] Discharge: Atorvastatin  Ciprofloxacin Linzess  All other medications remain the same after Hospital Discharge:??   Medications: Outpatient Encounter Medications as of 02/24/2021  Medication Sig Note   albuterol (VENTOLIN HFA) 108 (90 Base) MCG/ACT inhaler Inhale into the lungs.    aspirin 81 MG chewable tablet Chew 81 mg by mouth daily.    calcium carbonate (TUMS - DOSED IN MG ELEMENTAL CALCIUM) 500 MG chewable tablet Chew 1 tablet by mouth daily as needed for indigestion or heartburn.    dicyclomine (BENTYL) 20 MG tablet Take by mouth.    docusate sodium (COLACE) 100 MG capsule Take 1 capsule (100 mg total) by mouth 2 (two) times daily. (Patient taking differently: Take 100 mg by mouth daily.)    fluticasone (FLONASE) 50 MCG/ACT nasal spray Place 2 sprays into both nostrils daily as needed for allergies.     fluticasone furoate-vilanterol (BREO ELLIPTA) 200-25 MCG/INH AEPB Inhale 1 puff into the lungs daily.    furosemide (LASIX) 20 MG tablet TAKE 1 TABLET(20 MG) BY MOUTH DAILY    gabapentin (NEURONTIN) 600 MG tablet Take 1 tablet (600 mg total) by mouth 3 (three) times daily. 11/18/2020: Patient takes 1/2 tablet twice daily due to sedation   levothyroxine (SYNTHROID) 88 MCG tablet Take 1 tablet (88 mcg total) by mouth daily before breakfast.    lisinopril (ZESTRIL) 5 MG tablet Take 5 mg by mouth daily.    metoprolol tartrate (LOPRESSOR) 50 MG tablet TAKE 1 TABLET(50 MG) BY MOUTH TWICE DAILY WITH MEALS  montelukast (SINGULAIR) 10 MG tablet Take 1 tablet (10 mg total) by mouth at bedtime.    Multiple Vitamins-Minerals (MULTIVITAMIN WITH MINERALS) tablet Take 1 tablet by mouth daily.    nitroGLYCERIN (NITROSTAT) 0.4 MG SL tablet Place 1 tablet (0.4 mg total) under the tongue every 5 (five) minutes as needed for chest pain.     omeprazole (PRILOSEC) 40 MG capsule Take 1 capsule (40 mg total) by mouth 2 (two) times daily.    ondansetron (ZOFRAN ODT) 4 MG disintegrating tablet Take 1 tablet (4 mg total) by mouth every 8 (eight) hours as needed for nausea or vomiting.    potassium chloride SA (KLOR-CON) 20 MEQ tablet TAKE 1 TABLET(20 MEQ) BY MOUTH DAILY    rosuvastatin (CRESTOR) 10 MG tablet Take 10 mg by mouth daily.    ticagrelor (BRILINTA) 90 MG TABS tablet Take 90 mg by mouth 2 (two) times daily.    tiZANidine (ZANAFLEX) 4 MG tablet Take 4 mg by mouth daily as needed.    topiramate (TOPAMAX) 25 MG tablet Take 1 tablet (25 mg total) by mouth daily for 7 days, THEN 2 tablets (50 mg total) daily for 21 days.    topiramate (TOPAMAX) 50 MG tablet Take 1 tablet (50 mg total) by mouth daily.    tretinoin (RETIN-A) 0.05 % cream tretinoin 0.05 % topical cream    vitamin E 180 MG (400 UNITS) capsule Take 400 Units by mouth daily.    No facility-administered encounter medications on file as of 02/24/2021.    Lipid Panel    Component Value Date/Time   CHOL 202 (H) 12/30/2020 1053   TRIG 153 (H) 12/30/2020 1053   HDL 51 12/30/2020 1053   LDLCALC 124 (H) 12/30/2020 1053    10-year ASCVD risk score: The 10-year ASCVD risk score Mikey Bussing DC Brooke Bonito., et al., 2013) is: 29.4%   Values used to calculate the score:     Age: 75 years     Sex: Female     Is Non-Hispanic African American: No     Diabetic: Yes     Tobacco smoker: No     Systolic Blood Pressure: 740 mmHg     Is BP treated: Yes     HDL Cholesterol: 51 mg/dL     Total Cholesterol: 202 mg/dL  Current antihyperlipidemic regimen:  Rosuvastatin 10 mg - daily Patient reported taking weekly due to leg pain  Previous antihyperlipidemic medications tried:  Atorvastatin  ASCVD risk enhancing conditions: age >22  What recent interventions/DTPs have been made by any provider to improve Cholesterol control since last CPP Visit: 12/22/20- Roselyn Bering, MD ([facility]  Lab started crestor 10 mg daily, discontinued lipitor  Any recent hospitalizations or ED visits since last visit with CPP? Yes  What diet changes have been made to improve Cholesterol?  Patient stated she tries to eat foods that are healthy for her. She eats vegetables, meat, rice, salad, and  ice cream  What exercise is being done to improve Cholesterol?  Patient stated she isn't able to do much activity due to her back pain. She take hydrocodone to help alleviate her pains and stated it helps sometimes  Adherence Review: Does the patient have >5 day gap between last estimated fill dates? No  Star Rating Drugs Medication Name Last Fill Days supply Rosuvastatin 10 mg 12/23/20 14 DS Lisinopril 5 mg  12/23/20 90 DS  Care Gaps: Last annual wellness visit?None noted message sent to nurse  Wilford Sports CPA, [place]

## 2021-02-25 MED ORDER — BUDESONIDE-FORMOTEROL FUMARATE 160-4.5 MCG/ACT IN AERO: 2.0000 | INHALATION_SPRAY | Freq: Two times a day (BID) | RESPIRATORY_TRACT | 6 refills | Status: DC

## 2021-02-25 NOTE — Telephone Encounter (Signed)
Please send order for symbicort 160. Take 2 puffs twice daily

## 2021-02-25 NOTE — Telephone Encounter (Signed)
I called and spoke with patient informing her that we heard from pharmacy that [facility] is ot covered by insurance and dr. Vaughan Browner wants to send in symbicort and informed her that its 2 puffs, twice daily and I sent it into preferred pharmacy. Patient verbalized understanding, nothing further needed.

## 2021-03-01 ENCOUNTER — Other Ambulatory Visit: Payer: Self-pay | Admitting: Family Medicine

## 2021-03-02 ENCOUNTER — Telehealth: Payer: Self-pay | Admitting: Pulmonary Disease

## 2021-03-02 ENCOUNTER — Other Ambulatory Visit: Payer: Medicare Other

## 2021-03-02 ENCOUNTER — Telehealth: Payer: Self-pay

## 2021-03-02 MED ORDER — AZITHROMYCIN 250 MG PO TABS: 250.0000 mg | ORAL_TABLET | ORAL | 0 refills | Status: DC

## 2021-03-02 NOTE — Telephone Encounter (Signed)
I spoke with the pt and notified of response per Dr Vaughan Browner  She verbalized understanding  She only wants the zpack  Rx was sent

## 2021-03-02 NOTE — Telephone Encounter (Signed)
Called to confirm patient is only taking statin 1/week. Dr. Tobie Poet asked me to inquire about her willingness to try alternative therapy such as Repatha. Patient stated Repatha caused her legs to ache and she's not open to trying anything new at this time

## 2021-03-02 NOTE — Telephone Encounter (Signed)
Called and spoke with Courtney [name].  Courtney [name] stated she tested positive for covid yesterday.  Courtney [name] stated she is having a productive cough, with thick, green sputum, fatigue, and temperature 101. Courtney [name] stated she has been taking Alka-Seltzer plus every 4 hours, vitamin C, vitamin D3, and zinc.  Courtney [name] stated she would like a antibiotic sent to [facility].  Courtney [name] stated she does not want to try any oral covid medications.  Message routed to Dr. Vaughan Browner to advise

## 2021-03-02 NOTE — Telephone Encounter (Signed)
Ok to send in a prescription for z pack  I would strongly recommend that she take an oral COVID medication as well.

## 2021-03-04 ENCOUNTER — Ambulatory Visit
Admission: RE | Admit: 2021-03-04 | Discharge: 2021-03-04 | Disposition: A | Discharge status: Home / Self Care | Payer: Medicare Other | Source: Ambulatory Visit | Attending: Pulmonary Disease | Admitting: Pulmonary Disease

## 2021-03-04 DIAGNOSIS — I700 Atherosclerosis of aorta: Secondary | ICD-10-CM | POA: Diagnosis not present

## 2021-03-04 DIAGNOSIS — J479 Bronchiectasis, uncomplicated: Secondary | ICD-10-CM

## 2021-03-06 ENCOUNTER — Telehealth: Payer: Self-pay

## 2021-03-06 NOTE — Telephone Encounter (Signed)
Reached out to patient to schedule AWV, LM with my direct number and office number.

## 2021-03-06 NOTE — Telephone Encounter (Signed)
-----   Message from Wilford Sports sent at 02/24/2021  4:32 PM EDT ----- Regarding: AWV This patient has not had an awv this year, would you be able to reach out to schedule one for her?

## 2021-03-10 ENCOUNTER — Other Ambulatory Visit: Payer: Self-pay | Admitting: Family Medicine

## 2021-03-13 ENCOUNTER — Other Ambulatory Visit: Payer: Self-pay | Admitting: Family Medicine

## 2021-03-13 DIAGNOSIS — K529 Noninfective gastroenteritis and colitis, unspecified: Secondary | ICD-10-CM

## 2021-03-13 DIAGNOSIS — R1013 Epigastric pain: Secondary | ICD-10-CM

## 2021-03-15 ENCOUNTER — Telehealth (INDEPENDENT_AMBULATORY_CARE_PROVIDER_SITE_OTHER): Payer: Medicare Other | Admitting: Legal Medicine

## 2021-03-15 ENCOUNTER — Encounter: Payer: Self-pay | Admitting: Legal Medicine

## 2021-03-15 DIAGNOSIS — J470 Bronchiectasis with acute lower respiratory infection: Secondary | ICD-10-CM

## 2021-03-15 MED ORDER — AMOXICILLIN-POT CLAVULANATE 875-125 MG PO TABS: 1.0000 | ORAL_TABLET | Freq: Two times a day (BID) | ORAL | 0 refills | Status: DC

## 2021-03-15 NOTE — Progress Notes (Signed)
Virtual Visit via Video Note   This visit type was conducted due to national recommendations for restrictions regarding the COVID-19 Pandemic (e.g. social distancing) in an effort to limit this patient's exposure and mitigate transmission in our community.  Due to her co-morbid illnesses, this patient is at least at moderate risk for complications without adequate follow up.  This format is felt to be most appropriate for this patient at this time.  All issues noted in this document were discussed and addressed.  A limited physical exam was performed with this format.  A verbal consent was obtained for the virtual visit.   Date:  03/15/2021   ID:  Courtney [name], DOB [birth year], MRN [id number]  Patient Location: Home Provider Location: Office/Clinic  PCP:  [name], MD   Evaluation Performed:  New Patient Evaluation  Chief Complaint: cough  History of Present Illness:    Courtney [name] is a 75 y.o. female with bronchiectasis, she is having copius, sputum.  No fever or chills.  The patient does not have symptoms concerning for COVID-19 infection (fever, chills, cough, or new shortness of breath).    Past Medical History:  Diagnosis Date   Anemia    Arthritis    Bronchiectasis (HCC)    Chronic idiopathic constipation    Depression, major, recurrent, moderate (HCC)    Headache    Heart murmur    History of bladder infections    History of COVID-19    Hyperlipidemia    Hypertension    Hypothyroidism    Knee pain, bilateral    Osteoarthritis    Pneumonia    Primary insomnia    Recovering alcoholic (HCC)    SVT (supraventricular tachycardia) (HCC)    UTI (urinary tract infection)    Varicose veins    Vitamin B12 deficiency     Past Surgical History:  Procedure Laterality Date   ABDOMINAL HYSTERECTOMY  1991   ANGIOPLASTY     at least 15 years ago, pt. denies    APPENDECTOMY     AUGMENTATION MAMMAPLASTY Bilateral    BACK SURGERY     had 2 surgeries. Have rods  placed in back    BARIATRIC SURGERY     lap band-at least 14 years ago   BREAST BIOPSY Left    x2   BREAST ENHANCEMENT SURGERY  2006   CHOLECYSTECTOMY  12/2019   removed lap band.    COLONOSCOPY  12/01/2016   Moderate predominantly sigmoid diverticulosis.    EYE SURGERY     IOL- bilateral - [place]   KNEE ARTHROSCOPY Left    lap band surgery  1981   LUMBAR FUSION  07/29/2015   posterior level one   removal of cervical disc fragments  10/2007   pt. denies     Family History  Problem Relation Age of Onset   Heart disease Mother    Heart disease Father    Heart disease Brother    Diabetes Sister    Heart disease Brother    Heart disease Brother    Heart disease Brother    Heart disease Brother    Other Sister        BRAIN TUMOR    Social History   Socioeconomic History   Marital status: Married    Spouse name: Not on file   Number of children: 2   Years of education: Not on file   Highest education level: Not on file  Occupational History   Occupation: retired  Comment: nurse  Tobacco Use   Smoking status: Never   Smokeless tobacco: Never  Vaping Use   Vaping Use: Never used  Substance and Sexual Activity   Alcohol use: No    Alcohol/week: 0.0 standard drinks    Comment: recovery x 20 yrs   Drug use: No   Sexual activity: Not Currently    Comment: MARRIED  Other Topics Concern   Not on file  Social History Narrative   Not on file   Social Determinants of Health   Financial Resource Strain: Not on file  Food Insecurity: No Food Insecurity   Worried About Charity fundraiser in the Last Year: Never true   Ran Out of Food in the Last Year: Never true  Transportation Needs: No Transportation Needs   Lack of Transportation (Medical): No   Lack of Transportation (Non-Medical): No  Physical Activity: Not on file  Stress: Not on file  Social Connections: Not on file  Intimate Partner Violence: Not on file    Outpatient Medications Prior to Visit   Medication Sig Dispense Refill   albuterol (VENTOLIN HFA) 108 (90 Base) MCG/ACT inhaler Inhale into the lungs.     aspirin 81 MG chewable tablet Chew 81 mg by mouth daily.     azithromycin (ZITHROMAX) 250 MG tablet Take 1 tablet (250 mg total) by mouth as directed. 6 tablet 0   budesonide-formoterol (SYMBICORT) 160-4.5 MCG/ACT inhaler Inhale 2 puffs into the lungs in the morning and at bedtime. 1 each 6   calcium carbonate (TUMS - DOSED IN MG ELEMENTAL CALCIUM) 500 MG chewable tablet Chew 1 tablet by mouth daily as needed for indigestion or heartburn.     dicyclomine (BENTYL) 20 MG tablet Take by mouth.     docusate sodium (COLACE) 100 MG capsule Take 1 capsule (100 mg total) by mouth 2 (two) times daily. (Patient taking differently: Take 100 mg by mouth daily.) 60 capsule 0   fluticasone (FLONASE) 50 MCG/ACT nasal spray Place 2 sprays into both nostrils daily as needed for allergies.      fluticasone furoate-vilanterol (BREO ELLIPTA) 200-25 MCG/INH AEPB Inhale 1 puff into the lungs daily. 60 each 2   furosemide (LASIX) 20 MG tablet TAKE 1 TABLET(20 MG) BY MOUTH DAILY 30 tablet 3   gabapentin (NEURONTIN) 600 MG tablet Take 1 tablet (600 mg total) by mouth 3 (three) times daily. 90 tablet 3   levothyroxine (SYNTHROID) 88 MCG tablet Take 1 tablet (88 mcg total) by mouth daily before breakfast. 90 tablet 0   lisinopril (ZESTRIL) 5 MG tablet Take 5 mg by mouth daily.     metoprolol tartrate (LOPRESSOR) 50 MG tablet TAKE 1 TABLET(50 MG) BY MOUTH TWICE DAILY WITH MEALS 180 tablet 1   montelukast (SINGULAIR) 10 MG tablet Take 1 tablet (10 mg total) by mouth at bedtime. 90 tablet 3   Multiple Vitamins-Minerals (MULTIVITAMIN WITH MINERALS) tablet Take 1 tablet by mouth daily.     nitroGLYCERIN (NITROSTAT) 0.4 MG SL tablet Place 1 tablet (0.4 mg total) under the tongue every 5 (five) minutes as needed for chest pain. 25 tablet 0   omeprazole (PRILOSEC) 40 MG capsule TAKE 1 CAPSULE(40 MG) BY MOUTH TWICE  DAILY 180 capsule 1   ondansetron (ZOFRAN ODT) 4 MG disintegrating tablet Take 1 tablet (4 mg total) by mouth every 8 (eight) hours as needed for nausea or vomiting. 20 tablet 0   potassium chloride SA (KLOR-CON) 20 MEQ tablet TAKE 1 TABLET(20 MEQ) BY MOUTH DAILY  30 tablet 3   ticagrelor (BRILINTA) 90 MG TABS tablet Take 90 mg by mouth 2 (two) times daily.     tiZANidine (ZANAFLEX) 4 MG tablet Take 4 mg by mouth daily as needed.     topiramate (TOPAMAX) 50 MG tablet Take 1 tablet (50 mg total) by mouth daily. 90 tablet 0   tretinoin (RETIN-A) 0.05 % cream tretinoin 0.05 % topical cream     vitamin E 180 MG (400 UNITS) capsule Take 400 Units by mouth daily.     topiramate (TOPAMAX) 25 MG tablet Take 1 tablet (25 mg total) by mouth daily for 7 days, THEN 2 tablets (50 mg total) daily for 21 days. 60 tablet 0   No facility-administered medications prior to visit.    Allergies:   Levofloxacin, Bactrim [sulfamethoxazole-trimethoprim], Clarithromycin, Lyrica [pregabalin], Macrobid [nitrofurantoin], and Statins   Social History   Tobacco Use   Smoking status: Never   Smokeless tobacco: Never  Vaping Use   Vaping Use: Never used  Substance Use Topics   Alcohol use: No    Alcohol/week: 0.0 standard drinks    Comment: recovery x 20 yrs   Drug use: No     Review of Systems  Constitutional:  Negative for chills and fever.  HENT: Negative.    Eyes:  Negative for redness.  Respiratory:  Negative for cough.   Cardiovascular:  Negative for chest pain.  Gastrointestinal:  Negative for constipation and heartburn.  Genitourinary:  Negative for dysuria and urgency.  Musculoskeletal:  Negative for myalgias and neck pain.  Neurological: Negative.   Psychiatric/Behavioral:  Negative for depression and suicidal ideas.     Labs/Other Tests and Data Reviewed:    Recent Labs: 02/08/2021: ALT 25; BUN 13; Creatinine, Ser 0.88; Hemoglobin 12.4; Platelets 294; Potassium 4.4; Sodium 138; TSH 1.630    Recent Lipid Panel Lab Results  Component Value Date/Time   CHOL 202 (H) 12/30/2020 10:53 AM   TRIG 153 (H) 12/30/2020 10:53 AM   HDL 51 12/30/2020 10:53 AM   CHOLHDL 4.0 12/30/2020 10:53 AM   CHOLHDL 4.2 09/20/2009 11:00 PM   LDLCALC 124 (H) 12/30/2020 10:53 AM    Wt Readings from Last 3 Encounters:  02/08/21 171 lb (77.6 kg)  02/04/21 174 lb 3.2 oz (79 kg)  12/30/20 175 lb (79.4 kg)     Objective:    Vital Signs:  There were no vitals taken for this visit.   Physical Exam Vitals reviewed.  Constitutional:      General: She is not in acute distress.    Appearance: Normal appearance.  HENT:     Right Ear: Tympanic membrane normal.     Left Ear: Tympanic membrane normal.     Nose: Nose normal.     Mouth/Throat:     Mouth: Mucous membranes are moist.  Eyes:     Extraocular Movements: Extraocular movements intact.     Conjunctiva/sclera: Conjunctivae normal.     Pupils: Pupils are equal, round, and reactive to light.  Cardiovascular:     Rate and Rhythm: Regular rhythm.     Pulses: Normal pulses.     Heart sounds: Normal heart sounds. No murmur heard.   No gallop.  Pulmonary:     Effort: Pulmonary effort is normal. No respiratory distress.     Breath sounds: Normal breath sounds. No wheezing.  Abdominal:     General: Abdomen is flat. Bowel sounds are normal. There is no distension.     Palpations: Abdomen is soft.  Tenderness: There is no abdominal tenderness.  Musculoskeletal:        General: Normal range of motion.     Cervical back: Normal range of motion and neck supple.  Skin:    General: Skin is warm.     Capillary Refill: Capillary refill takes less than 2 seconds.  Neurological:     General: No focal deficit present.     Mental Status: She is alert and oriented to person, place, and time. Mental status is at baseline.  Psychiatric:        Mood and Affect: Mood normal.     ASSESSMENT & PLAN:   Diagnoses and all orders for this  visit: Bronchiectasis with acute lower respiratory infection ([place]) -     amoxicillin-clavulanate (AUGMENTIN) 875-125 MG tablet; Take 1 tablet by mouth 2 (two) times daily.  Patient has chronic bronchiectasis with exacerbation.  Start augmenting BID for 14 days.      COVID-19 Education: The signs and symptoms of COVID-19 were discussed with the patient and how to seek care for testing (follow up with PCP or arrange E-visit). The importance of social distancing was discussed today.   I spent 20 minutes dedicated to the care of this patient on the date of this encounter to include face-to-face time with the patient, as well as:   Follow Up:  In Person prn  Signed, [name], MD  03/15/2021 2:36 PM    [place]

## 2021-03-20 DIAGNOSIS — R7303 Prediabetes: Secondary | ICD-10-CM

## 2021-03-20 HISTORY — DX: Prediabetes: R73.03

## 2021-03-29 ENCOUNTER — Ambulatory Visit: Payer: Medicare Other | Admitting: Gastroenterology

## 2021-03-30 ENCOUNTER — Ambulatory Visit: Payer: Medicare Other | Admitting: Pulmonary Disease

## 2021-04-08 ENCOUNTER — Encounter: Payer: Self-pay | Admitting: Pulmonary Disease

## 2021-04-08 ENCOUNTER — Other Ambulatory Visit: Payer: Self-pay

## 2021-04-08 ENCOUNTER — Ambulatory Visit (INDEPENDENT_AMBULATORY_CARE_PROVIDER_SITE_OTHER): Payer: Medicare Other | Admitting: Pulmonary Disease

## 2021-04-08 VITALS — BP 120/68 | HR 63 | Temp 97.9°F | Ht 65.5 in | Wt 174.0 lb

## 2021-04-08 DIAGNOSIS — J849 Interstitial pulmonary disease, unspecified: Secondary | ICD-10-CM | POA: Diagnosis not present

## 2021-04-08 DIAGNOSIS — I2510 Atherosclerotic heart disease of native coronary artery without angina pectoris: Secondary | ICD-10-CM

## 2021-04-08 DIAGNOSIS — J479 Bronchiectasis, uncomplicated: Secondary | ICD-10-CM | POA: Diagnosis not present

## 2021-04-08 NOTE — Addendum Note (Signed)
Addended by: Elton Sin on: 04/08/2021 09:40 AM   Modules accepted: Orders

## 2021-04-08 NOTE — Progress Notes (Signed)
Courtney [name]    [id number]    [birth year]  Primary Care Physician:[name], Elnita Maxwell, MD  Referring Physician: [name], MD [address],  [place] [address]  Chief complaint: Follow-up for recurrent pneumonia, bronchiectasis  HPI: 75 year old with history of SVT, hyperlipidemia, allergies, GERD She has episodes of recurrent bronchiectasis, pneumonia.  Treated with antibiotics over the past few months.  Continues to have cough with congestion, wheezing, dyspnea.  Also has significant symptoms of postnasal drip, rhinitis. Sent to pulmonary clinic for evaluation of recurrent bronchitis, suspected bronchiectasis.  Over the past year in 2020  has had recurrent attacks of bronchitis/bronchiectasis exacerbation treated with antibiotics by primary care. Reports testing positive for Covid in September 2020 but did not need hospitaliation.  Also has recurrent UTIs for which she was given prolonged course of nitrofurantoin in 2020. Given a trial of prednisone in 2021  She did not tolerated it well at all with side effects of rash, mood changes, insomnia and stopped it after 9 days.  She did not tolerate Bactrim for PCP prophylaxis as well.  She had a stent placed for coronary artery disease in July 2022.  Developed lower GI bleed due to Brilinta with admission at [facility].  Being followed by GI  Pets: No pets Occupation: Retired Marine scientist Exposures: No known exposures, no mold, hot tub, Jacuzzi.  Has a down comforter which she got rid off on 09/13/2019 ILD exposure questionnaire 10/14/2019 significant for nitrofurantoin in 2020 and down comforter.  Stopped exposure to down in 2021 Smoking history: Never smoker Travel history: Likes to travel around the country in an RV Relevant family history: Brother and sister have asthma.  Interim history: Here for review of CT scan and PFTs States that breathing is doing well with no issues  She was positive for COVID-19 in  September 2022.  This was treated with prednisone.  She did not want oral treatment for COVID-19.  Paxlovid was contraindicated due to use of Brilinta   Outpatient Encounter Medications as of 04/08/2021  Medication Sig   albuterol (VENTOLIN HFA) 108 (90 Base) MCG/ACT inhaler Inhale into the lungs.   aspirin 81 MG chewable tablet Chew 81 mg by mouth daily.   budesonide-formoterol (SYMBICORT) 160-4.5 MCG/ACT inhaler Inhale 2 puffs into the lungs in the morning and at bedtime.   calcium carbonate (TUMS - DOSED IN MG ELEMENTAL CALCIUM) 500 MG chewable tablet Chew 1 tablet by mouth daily as needed for indigestion or heartburn.   dicyclomine (BENTYL) 20 MG tablet Take by mouth.   docusate sodium (COLACE) 100 MG capsule Take 1 capsule (100 mg total) by mouth 2 (two) times daily. (Patient taking differently: Take 100 mg by mouth daily.)   fluticasone (FLONASE) 50 MCG/ACT nasal spray Place 2 sprays into both nostrils daily as needed for allergies.    fluticasone furoate-vilanterol (BREO ELLIPTA) 200-25 MCG/INH AEPB Inhale 1 puff into the lungs daily.   furosemide (LASIX) 20 MG tablet TAKE 1 TABLET(20 MG) BY MOUTH DAILY   gabapentin (NEURONTIN) 600 MG tablet Take 1 tablet (600 mg total) by mouth 3 (three) times daily. (Patient taking differently: Take 600 mg by mouth 3 (three) times daily. As needed)   levothyroxine (SYNTHROID) 88 MCG tablet Take 1 tablet (88 mcg total) by mouth daily before breakfast.   lisinopril (ZESTRIL) 5 MG tablet Take 5 mg by mouth daily.   metoprolol tartrate (LOPRESSOR) 50 MG tablet TAKE 1 TABLET(50 MG) BY MOUTH  TWICE DAILY WITH MEALS   montelukast (SINGULAIR) 10 MG tablet Take 1 tablet (10 mg total) by mouth at bedtime.   Multiple Vitamins-Minerals (MULTIVITAMIN WITH MINERALS) tablet Take 1 tablet by mouth daily.   nitroGLYCERIN (NITROSTAT) 0.4 MG SL tablet Place 1 tablet (0.4 mg total) under the tongue every 5 (five) minutes as needed for chest pain.   omeprazole (PRILOSEC) 40  MG capsule TAKE 1 CAPSULE(40 MG) BY MOUTH TWICE DAILY   ondansetron (ZOFRAN ODT) 4 MG disintegrating tablet Take 1 tablet (4 mg total) by mouth every 8 (eight) hours as needed for nausea or vomiting.   potassium chloride SA (KLOR-CON) 20 MEQ tablet TAKE 1 TABLET(20 MEQ) BY MOUTH DAILY   ticagrelor (BRILINTA) 90 MG TABS tablet Take 90 mg by mouth 2 (two) times daily.   tiZANidine (ZANAFLEX) 4 MG tablet Take 4 mg by mouth daily as needed.   tretinoin (RETIN-A) 0.05 % cream tretinoin 0.05 % topical cream   vitamin E 180 MG (400 UNITS) capsule Take 400 Units by mouth daily.   [DISCONTINUED] amoxicillin-clavulanate (AUGMENTIN) 875-125 MG tablet Take 1 tablet by mouth 2 (two) times daily.   [DISCONTINUED] azithromycin (ZITHROMAX) 250 MG tablet Take 1 tablet (250 mg total) by mouth as directed.   [DISCONTINUED] topiramate (TOPAMAX) 25 MG tablet Take 1 tablet (25 mg total) by mouth daily for 7 days, THEN 2 tablets (50 mg total) daily for 21 days.   [DISCONTINUED] topiramate (TOPAMAX) 50 MG tablet Take 1 tablet (50 mg total) by mouth daily.   No facility-administered encounter medications on file as of 04/08/2021.   Physical Exam: Blood pressure 120/68, pulse 63, temperature 97.9 F (36.6 C), temperature source Oral, height 5' 5.5" (1.664 m), weight 174 lb (78.9 kg), SpO2 99 %. Gen:      No acute distress HEENT:  EOMI, sclera anicteric Neck:     No masses; no thyromegaly Lungs:    Clear to auscultation bilaterally; normal respiratory effort CV:         Regular rate and rhythm; no murmurs Abd:      + bowel sounds; soft, non-tender; no palpable masses, no distension Ext:    No edema; adequate peripheral perfusion Skin:      Warm and dry; no rash Neuro: alert and oriented x 3 Psych: normal mood and affect   Data Reviewed: Imaging: CT angio 01/30/18- patchy multifocal groundglass opacities, nodular densities bilaterally left greater than right.  Patulous esophagus with mild thickening.  I have  reviewed the images personally.  High-resolution CT 04/02/2018- mid and lower lung groundglass opacities, mild bronchiectasis and nodularity.  Aortic atherosclerosis, coronary artery calcification I reviewed the images personally.  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  CT 03/04/2021-stable ILD in alternate pattern I have reviewed the images personally  PFTs: 03/21/2018 FVC 2.53 [90%), FEV1 2.29 [99%), F/F 90, TLC 85%, DLCO 87%  02/11/2021 FVC 2.46 [90%], FEV1 2.14 [104%], F/F 87, TLC 4.0 [1%], DLCO 14.87 [80%] Normal test   FENO 03/20/2018-24  Labs: CBC from primary care 03/05/2018-WBC 7, granulocytes 67%, lymphocytes 26%. 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  I had recommended a bronchoscopy for further evaluation with BAL and transbronchial biopsy but patient and her daughter in clinic  were reluctant to undergo an invasive procedure.  Has not tolerated a trial of prednisone  We have done allergens avoidance getting rid of the down comforter and nitrofurantoin and follow-up CT and PFTs earlier this month shows stability.  Lung function test is normal which is reassuring  Continue monitoring for now.  If there is  progression down the line then consider antifibrotic's  Continue breo inhaler, Mucinex and flutter valve for mucociliary clearance  GERD, ? aspiration History noted for gastric lap band, GERD and esophagitis on recent CT scan.  She may have esophageal dysfunction and recurrent aspiration that is causing the basal fibrosis. Gastric lap band removed in 2021 She is noncompliant with PPI and I have encouraged her to start taking it on a regular basis Consider barium swallow if she continues to have symptoms  Cough, postnasal drip Continue chlorphentermine, Flonase nasal spray  Plan/Recommendations: Continue inhalers High-res CT and PFTs in 1 year  [name] MD [facility] 04/08/2021, 9:16 AM  CC: [name], MD

## 2021-04-08 NOTE — Patient Instructions (Signed)
I am glad you are doing well with your breathing Your CT scanning stable and and PFTs show normal lung function which is reassuring  We will get a follow-up high-res CT and PFTs in 1 year Follow-up in 1 year after these tests

## 2021-04-12 ENCOUNTER — Other Ambulatory Visit: Payer: Self-pay | Admitting: Family Medicine

## 2021-04-12 NOTE — Telephone Encounter (Signed)
Refill sent to pharmacy.   

## 2021-04-13 ENCOUNTER — Ambulatory Visit (INDEPENDENT_AMBULATORY_CARE_PROVIDER_SITE_OTHER): Payer: Medicare Other | Admitting: Family Medicine

## 2021-04-13 ENCOUNTER — Other Ambulatory Visit: Payer: Self-pay

## 2021-04-13 ENCOUNTER — Encounter: Payer: Self-pay | Admitting: Family Medicine

## 2021-04-13 VITALS — BP 128/70 | HR 76 | Temp 97.2°F | Resp 16 | Ht 66.0 in | Wt 172.0 lb

## 2021-04-13 DIAGNOSIS — E782 Mixed hyperlipidemia: Secondary | ICD-10-CM | POA: Diagnosis not present

## 2021-04-13 DIAGNOSIS — J301 Allergic rhinitis due to pollen: Secondary | ICD-10-CM

## 2021-04-13 DIAGNOSIS — I2510 Atherosclerotic heart disease of native coronary artery without angina pectoris: Secondary | ICD-10-CM

## 2021-04-13 DIAGNOSIS — J470 Bronchiectasis with acute lower respiratory infection: Secondary | ICD-10-CM | POA: Diagnosis not present

## 2021-04-13 DIAGNOSIS — E038 Other specified hypothyroidism: Secondary | ICD-10-CM

## 2021-04-13 DIAGNOSIS — M791 Myalgia, unspecified site: Secondary | ICD-10-CM | POA: Diagnosis not present

## 2021-04-13 DIAGNOSIS — Z23 Encounter for immunization: Secondary | ICD-10-CM

## 2021-04-13 DIAGNOSIS — I471 Supraventricular tachycardia: Secondary | ICD-10-CM

## 2021-04-13 DIAGNOSIS — R7303 Prediabetes: Secondary | ICD-10-CM

## 2021-04-13 DIAGNOSIS — R300 Dysuria: Secondary | ICD-10-CM

## 2021-04-13 DIAGNOSIS — L650 Telogen effluvium: Secondary | ICD-10-CM | POA: Diagnosis not present

## 2021-04-13 DIAGNOSIS — M48062 Spinal stenosis, lumbar region with neurogenic claudication: Secondary | ICD-10-CM | POA: Diagnosis not present

## 2021-04-13 LAB — POCT URINALYSIS DIPSTICK
Bilirubin, UA: NEGATIVE
Blood, UA: NEGATIVE
Glucose, UA: NEGATIVE
Ketones, UA: NEGATIVE
Leukocytes, UA: NEGATIVE
Nitrite, UA: NEGATIVE
Protein, UA: NEGATIVE
Spec Grav, UA: 1.01 (ref 1.010–1.025)
Urobilinogen, UA: 0.2 E.U./dL
pH, UA: 7 (ref 5.0–8.0)

## 2021-04-13 MED ORDER — CETIRIZINE HCL 10 MG PO TABS: 10.0000 mg | ORAL_TABLET | Freq: Every day | ORAL | 3 refills | Status: DC

## 2021-04-13 NOTE — Progress Notes (Signed)
Subjective:  Patient ID: Courtney [name], female    DOB: 02-18-1946  Age: 75 y.o. MRN: [id number]  Chief Complaint  Patient presents with   Hyperlipidemia   Hypothyroidism    HPI Bronchiectasis: pt saw pulmonary, Dr. Rolla Etienne. Repeated CT scan which showed lesions still on lungs unchanged from 1 yr. Using up breo then changing to symbicort due to insurance formulary.   SVT: on metoprolol tartrate.  Mixed hyperlipidemia: Unable to take statins or repatha.   CAD:  Had a cardiac stent in July 2022. On brilinta 90 mg one twice daily, asprin 81 mg once daily, furosemide 20 mg once daily, on lisinopril 5 mg once daily, metoprolol tartrate 50 mg one twice a day. On poassium chloride 20 meq once daily.   Lumbar back pain with radiculopathy down both legs. Needs back surgery, but has to wait one year due to brilinta, tizanidine 4 mg once qhs prn. Sees pain clinic and is on oxycodone/apap 10/325 mg q6 hrs prn severe pain. On gabapentin 600 mg tid.   GERD: omeprazole 40 mg one twice a day.   Hypothyroidism: On synthroid 88 mcg once daily in am.  Telangia effluvium: Nothing has helped. Continue sto thin.    Current Outpatient Medications on File Prior to Visit  Medication Sig Dispense Refill   albuterol (VENTOLIN HFA) 108 (90 Base) MCG/ACT inhaler Inhale into the lungs.     aspirin 81 MG chewable tablet Chew 81 mg by mouth daily.     budesonide-formoterol (SYMBICORT) 160-4.5 MCG/ACT inhaler Inhale 2 puffs into the lungs in the morning and at bedtime. 1 each 6   docusate sodium (COLACE) 100 MG capsule Take 1 capsule (100 mg total) by mouth 2 (two) times daily. (Patient taking differently: Take 100 mg by mouth daily.) 60 capsule 0   fluticasone (FLONASE) 50 MCG/ACT nasal spray Place 2 sprays into both nostrils daily as needed for allergies.      furosemide (LASIX) 20 MG tablet TAKE 1 TABLET(20 MG) BY MOUTH DAILY 30 tablet 3   gabapentin (NEURONTIN) 600 MG tablet Take 1 tablet (600 mg total) by  mouth 3 (three) times daily. (Patient taking differently: Take 600 mg by mouth at bedtime. As needed) 90 tablet 3   levothyroxine (SYNTHROID) 88 MCG tablet TAKE 1 TABLET(88 MCG) BY MOUTH DAILY BEFORE AND BREAKFAST 90 tablet 0   lisinopril (ZESTRIL) 5 MG tablet Take 5 mg by mouth daily.     metoprolol tartrate (LOPRESSOR) 50 MG tablet TAKE 1 TABLET(50 MG) BY MOUTH TWICE DAILY WITH MEALS 180 tablet 1   montelukast (SINGULAIR) 10 MG tablet Take 1 tablet (10 mg total) by mouth at bedtime. 90 tablet 3   Multiple Vitamins-Minerals (MULTIVITAMIN WITH MINERALS) tablet Take 1 tablet by mouth daily.     nitroGLYCERIN (NITROSTAT) 0.4 MG SL tablet Place 1 tablet (0.4 mg total) under the tongue every 5 (five) minutes as needed for chest pain. 25 tablet 0   omeprazole (PRILOSEC) 40 MG capsule TAKE 1 CAPSULE(40 MG) BY MOUTH TWICE DAILY 180 capsule 1   ondansetron (ZOFRAN ODT) 4 MG disintegrating tablet Take 1 tablet (4 mg total) by mouth every 8 (eight) hours as needed for nausea or vomiting. 20 tablet 0   oxyCODONE-acetaminophen (PERCOCET) 10-325 MG tablet Take 1 tablet by mouth 4 (four) times daily as needed.     potassium chloride SA (KLOR-CON) 20 MEQ tablet TAKE 1 TABLET(20 MEQ) BY MOUTH DAILY 30 tablet 3   ticagrelor (BRILINTA) 90 MG TABS tablet Take  90 mg by mouth 2 (two) times daily.     tiZANidine (ZANAFLEX) 4 MG tablet Take 4 mg by mouth daily as needed.     tretinoin (RETIN-A) 0.05 % cream tretinoin 0.05 % topical cream     vitamin E 180 MG (400 UNITS) capsule Take 400 Units by mouth daily.     No current facility-administered medications on file prior to visit.   Past Medical History:  Diagnosis Date   Anemia    Arthritis    Bronchiectasis ([place])    CAP (community acquired pneumonia)  vs Eosinophilic Pna 08/23/5730   Followed in Pulmonary clinic/ [facility]/ [place]    Cardiac dysrhythmia    Chronic idiopathic constipation    Colitis, acute 02/08/2021   Depression, major, recurrent, moderate  (HCC)    Headache    Heart murmur    History of bladder infections    History of COVID-19    Hyperlipidemia    Hypertension    Hypothyroidism    Knee pain, bilateral    Melena 02/08/2021   Osteoarthritis    Pneumonia    Primary insomnia    Recovering alcoholic (HCC)    SVT (supraventricular tachycardia) (HCC)    UTI (urinary tract infection)    Varicose veins    Vitamin B12 deficiency    Past Surgical History:  Procedure Laterality Date   ABDOMINAL HYSTERECTOMY  1991   ANGIOPLASTY     at least 15 years ago, pt. denies    APPENDECTOMY     AUGMENTATION MAMMAPLASTY Bilateral    BACK SURGERY     had 2 surgeries. Have rods placed in back    BARIATRIC SURGERY     lap band-at least 14 years ago   BREAST BIOPSY Left    x2   BREAST ENHANCEMENT SURGERY  2006   CHOLECYSTECTOMY  12/2019   removed lap band.    COLONOSCOPY  12/01/2016   Moderate predominantly sigmod diverticulosis. Otherwise grossly normal colonoscopy   EYE SURGERY     IOL- bilateral - [place]   KNEE ARTHROSCOPY Left    lap band surgery  1981   LUMBAR FUSION  07/29/2015   posterior level one   removal of cervical disc fragments  10/2007   pt. denies     Family History  Problem Relation Age of Onset   Heart disease Mother    Heart disease Father    Diabetes Sister    Other Sister        BRAIN TUMOR   Heart disease Brother    Heart disease Brother    Heart disease Brother    Heart disease Brother    Heart disease Brother    Colon cancer Maternal Aunt    Colon cancer Cousin    Social History   Socioeconomic History   Marital status: Married    Spouse name: Not on file   Number of children: 2   Years of education: Not on file   Highest education level: Not on file  Occupational History   Occupation: retired    Comment: Marine scientist  Tobacco Use   Smoking status: Never   Smokeless tobacco: Never  Scientific laboratory technician Use: Never used  Substance and Sexual Activity   Alcohol use: No    Alcohol/week:  0.0 standard drinks    Comment: recovery x 20 yrs   Drug use: No   Sexual activity: Not Currently    Comment: MARRIED  Other Topics Concern   Not on file  Social History Narrative   Not on file   Social Determinants of Health   Financial Resource Strain: Not on file  Food Insecurity: No Food Insecurity   Worried About Charity fundraiser in the Last Year: Never true   Ran Out of Food in the Last Year: Never true  Transportation Needs: No Transportation Needs   Lack of Transportation (Medical): No   Lack of Transportation (Non-Medical): No  Physical Activity: Not on file  Stress: Not on file  Social Connections: Not on file    Review of Systems  Constitutional:  Positive for fatigue. Negative for chills and fever.  HENT:  Negative for congestion, rhinorrhea and sore throat.   Respiratory:  Negative for cough and shortness of breath.   Cardiovascular:  Negative for chest pain and palpitations.  Gastrointestinal:  Positive for constipation (controlled with colace). Negative for abdominal pain, diarrhea, nausea and vomiting.       Low abdominal pressure  Genitourinary:  Positive for urgency. Negative for dysuria.  Musculoskeletal:  Positive for arthralgias, back pain and myalgias.  Neurological:  Negative for dizziness, weakness, light-headedness and headaches.  Psychiatric/Behavioral:  Negative for dysphoric mood. The patient is not nervous/anxious.     Objective:  BP 128/70   Pulse 76   Temp (!) 97.2 F (36.2 C)   Resp 16   Ht 5\' 6"  (1.676 m)   Wt 172 lb (78 kg)   BMI 27.76 kg/m   BP/Weight 04/13/2021 04/08/2021 4/54/0981  Systolic BP 191 478 295  Diastolic BP 70 68 70  Wt. (Lbs) 172 174 171  BMI 27.76 28.51 28.46    Physical Exam Vitals reviewed.  Constitutional:      Appearance: Normal appearance. She is normal weight.  Neck:     Vascular: No carotid bruit.  Cardiovascular:     Rate and Rhythm: Normal rate and regular rhythm.     Heart sounds: Normal  heart sounds.  Pulmonary:     Effort: Pulmonary effort is normal. No respiratory distress.     Breath sounds: Normal breath sounds.  Abdominal:     General: Abdomen is flat. Bowel sounds are normal.     Palpations: Abdomen is soft.     Tenderness: There is no abdominal tenderness.  Neurological:     Mental Status: She is alert and oriented to person, place, and time.  Psychiatric:        Mood and Affect: Mood normal.        Behavior: Behavior normal.    Diabetic Foot Exam - Simple   No data filed      Lab Results  Component Value Date   WBC 7.1 04/13/2021   HGB 12.4 04/13/2021   HCT 37.6 04/13/2021   PLT 223 04/13/2021   GLUCOSE 102 (H) 04/13/2021   CHOL 267 (H) 04/13/2021   TRIG 164 (H) 04/13/2021   HDL 46 04/13/2021   LDLCALC 190 (H) 04/13/2021   ALT 24 04/13/2021   AST 21 04/13/2021   NA 137 04/13/2021   K 5.0 04/13/2021   CL 98 04/13/2021   CREATININE 1.13 (H) 04/13/2021   BUN 20 04/13/2021   CO2 23 04/13/2021   TSH 1.630 02/08/2021   HGBA1C 5.9 (H) 04/13/2021      Assessment & Plan:   Problem List Items Addressed This Visit       Cardiovascular and Mediastinum   Supraventricular tachycardia (HCC)    Continue metoprolol tartrate 25 mg daily.      Coronary  artery disease involving native coronary artery of native heart without angina pectoris    Continue brilinta 90 mg one twice daily, asprin 81 mg once daily, furosemide 20 mg once daily, on lisinopril 5 mg once daily, metoprolol tartrate 50 mg one twice a day. On poassium chloride 20 meq once daily.  Management per specialist.          Respiratory   Bronchiectasis with acute lower respiratory infection ([place])    Using up breo then changing to symbicort due to insurance formulary.       Non-seasonal allergic rhinitis due to pollen    Start zyrtec      Relevant Medications   cetirizine (ZYRTEC) 10 MG tablet     Endocrine   Secondary hypothyroidism    Continue on synthroid 88 mcg once daily  in am.        Musculoskeletal and Integument   Telogen effluvium    Recommend otc rogaine      Relevant Orders   Ferritin (Completed)     Other   Spinal stenosis of lumbar region with neurogenic claudication    Fair control.  Pt needs surgery.  Managed by pain clinic. Continue oxycodone/apap 10/325 mg q6 hrs prn severe pain. On gabapentin 600 mg tid.        Relevant Medications   oxyCODONE-acetaminophen (PERCOCET) 10-325 MG tablet   Mixed hyperlipidemia    Intolerant to statins and repatha.  Recommend start on nexlizet.  Recommend continue to work on eating healthy diet and exercise.       Relevant Orders   Comprehensive metabolic panel (Completed)   Lipid panel (Completed)   Myalgia    Check mg, phos.      Relevant Orders   CBC with Differential/Platelet (Completed)   Phosphorus (Completed)   Magnesium (Completed)   Prediabetes    Low sugar diet and exercise as tolerated.       Relevant Orders   Hemoglobin A1c (Completed)   Dysuria    UA normal.       Relevant Orders   POCT urinalysis dipstick (Completed)   Other Visit Diagnoses     Need for influenza vaccination    -  Primary   Relevant Orders   Flu Vaccine QUAD High Dose(Fluad) (Completed)     .  Meds ordered this encounter  Medications   cetirizine (ZYRTEC) 10 MG tablet    Sig: Take 1 tablet (10 mg total) by mouth daily.    Dispense:  90 tablet    Refill:  3    Orders Placed This Encounter  Procedures   Flu Vaccine QUAD High Dose(Fluad)   CBC with Differential/Platelet   Hemoglobin A1c   Comprehensive metabolic panel   Lipid panel   Phosphorus   Magnesium   Ferritin   POCT urinalysis dipstick     Follow-up: Return in about 3 months (around 07/14/2021) for chronic fasting.  An After Visit Summary was printed and given to the patient.  [name], MD [facility] [phone]

## 2021-04-14 LAB — CBC WITH DIFFERENTIAL/PLATELET
Basophils Absolute: 0.1 10*3/uL (ref 0.0–0.2)
Basos: 1 %
EOS (ABSOLUTE): 1.1 10*3/uL — ABNORMAL HIGH (ref 0.0–0.4)
Eos: 15 %
Hematocrit: 37.6 % (ref 34.0–46.6)
Hemoglobin: 12.4 g/dL (ref 11.1–15.9)
Immature Grans (Abs): 0 10*3/uL (ref 0.0–0.1)
Immature Granulocytes: 0 %
Lymphocytes Absolute: 2.1 10*3/uL (ref 0.7–3.1)
Lymphs: 29 %
MCH: 30.8 pg (ref 26.6–33.0)
MCHC: 33 g/dL (ref 31.5–35.7)
MCV: 93 fL (ref 79–97)
Monocytes Absolute: 0.6 10*3/uL (ref 0.1–0.9)
Monocytes: 8 %
Neutrophils Absolute: 3.3 10*3/uL (ref 1.4–7.0)
Neutrophils: 47 %
Platelets: 223 10*3/uL (ref 150–450)
RBC: 4.03 x10E6/uL (ref 3.77–5.28)
RDW: 12.1 % (ref 11.7–15.4)
WBC: 7.1 10*3/uL (ref 3.4–10.8)

## 2021-04-14 LAB — COMPREHENSIVE METABOLIC PANEL
ALT: 24 IU/L (ref 0–32)
AST: 21 IU/L (ref 0–40)
Albumin/Globulin Ratio: 1.8 (ref 1.2–2.2)
Albumin: 4.6 g/dL (ref 3.7–4.7)
Alkaline Phosphatase: 174 IU/L — ABNORMAL HIGH (ref 44–121)
BUN/Creatinine Ratio: 18 (ref 12–28)
BUN: 20 mg/dL (ref 8–27)
Bilirubin Total: 0.3 mg/dL (ref 0.0–1.2)
CO2: 23 mmol/L (ref 20–29)
Calcium: 9.8 mg/dL (ref 8.7–10.3)
Chloride: 98 mmol/L (ref 96–106)
Creatinine, Ser: 1.13 mg/dL — ABNORMAL HIGH (ref 0.57–1.00)
Globulin, Total: 2.5 g/dL (ref 1.5–4.5)
Glucose: 102 mg/dL — ABNORMAL HIGH (ref 70–99)
Potassium: 5 mmol/L (ref 3.5–5.2)
Sodium: 137 mmol/L (ref 134–144)
Total Protein: 7.1 g/dL (ref 6.0–8.5)
eGFR: 51 mL/min/{1.73_m2} — ABNORMAL LOW (ref >59)

## 2021-04-14 LAB — LIPID PANEL
Chol/HDL Ratio: 5.8 ratio — ABNORMAL HIGH (ref 0.0–4.4)
Cholesterol, Total: 267 mg/dL — ABNORMAL HIGH (ref 100–199)
HDL: 46 mg/dL (ref >39)
LDL Chol Calc (NIH): 190 mg/dL — ABNORMAL HIGH (ref 0–99)
Triglycerides: 164 mg/dL — ABNORMAL HIGH (ref 0–149)
VLDL Cholesterol Cal: 31 mg/dL (ref 5–40)

## 2021-04-14 LAB — HEMOGLOBIN A1C
Est. average glucose Bld gHb Est-mCnc: 123 mg/dL
Hgb A1c MFr Bld: 5.9 % — ABNORMAL HIGH (ref 4.8–5.6)

## 2021-04-14 LAB — MAGNESIUM: Magnesium: 2.1 mg/dL (ref 1.6–2.3)

## 2021-04-14 LAB — PHOSPHORUS: Phosphorus: 3.8 mg/dL (ref 3.0–4.3)

## 2021-04-14 LAB — FERRITIN: Ferritin: 48 ng/mL (ref 15–150)

## 2021-04-14 NOTE — Progress Notes (Signed)
Blood count normal.  Liver function normal.  Kidney function abnormal. Drink more water. Cholesterol:very high. Recommend nexlitol. Not a statin or in repatha class. See if we have any samples and ask her to try this.  HBA1C: 5.9. good.

## 2021-04-17 ENCOUNTER — Encounter: Payer: Self-pay | Admitting: Family Medicine

## 2021-04-17 DIAGNOSIS — R300 Dysuria: Secondary | ICD-10-CM | POA: Insufficient documentation

## 2021-04-17 DIAGNOSIS — R7303 Prediabetes: Secondary | ICD-10-CM | POA: Insufficient documentation

## 2021-04-17 DIAGNOSIS — I2510 Atherosclerotic heart disease of native coronary artery without angina pectoris: Secondary | ICD-10-CM | POA: Insufficient documentation

## 2021-04-17 DIAGNOSIS — L650 Telogen effluvium: Secondary | ICD-10-CM | POA: Insufficient documentation

## 2021-04-17 NOTE — Assessment & Plan Note (Signed)
Start zyrtec

## 2021-04-17 NOTE — Assessment & Plan Note (Signed)
Fair control.  Pt needs surgery.  Managed by pain clinic. Continue oxycodone/apap 10/325 mg q6 hrs prn severe pain. On gabapentin 600 mg tid.

## 2021-04-17 NOTE — Assessment & Plan Note (Signed)
Low sugar diet and exercise as tolerated.

## 2021-04-17 NOTE — Assessment & Plan Note (Signed)
Continue on synthroid 88 mcg once daily in am.

## 2021-04-17 NOTE — Assessment & Plan Note (Signed)
Check mg, phos 

## 2021-04-17 NOTE — Assessment & Plan Note (Signed)
Intolerant to statins and repatha.  Recommend start on nexlizet.  Recommend continue to work on eating healthy diet and exercise.

## 2021-04-17 NOTE — Assessment & Plan Note (Signed)
Recommend otc rogaine

## 2021-04-17 NOTE — Assessment & Plan Note (Signed)
Continue metoprolol tartrate 25 mg daily.

## 2021-04-17 NOTE — Assessment & Plan Note (Signed)
Using up breo then changing to symbicort due to insurance formulary.

## 2021-04-17 NOTE — Assessment & Plan Note (Signed)
UA normal.

## 2021-04-17 NOTE — Assessment & Plan Note (Signed)
Continue brilinta 90 mg one twice daily, asprin 81 mg once daily, furosemide 20 mg once daily, on lisinopril 5 mg once daily, metoprolol tartrate 50 mg one twice a day. On poassium chloride 20 meq once daily.  Management per specialist.

## 2021-05-02 DIAGNOSIS — M549 Dorsalgia, unspecified: Secondary | ICD-10-CM | POA: Diagnosis not present

## 2021-05-02 DIAGNOSIS — R1084 Generalized abdominal pain: Secondary | ICD-10-CM | POA: Diagnosis not present

## 2021-05-02 DIAGNOSIS — N3001 Acute cystitis with hematuria: Secondary | ICD-10-CM | POA: Diagnosis not present

## 2021-05-02 DIAGNOSIS — R300 Dysuria: Secondary | ICD-10-CM | POA: Diagnosis not present

## 2021-05-05 ENCOUNTER — Ambulatory Visit: Payer: Medicare Other | Admitting: Gastroenterology

## 2021-05-05 DIAGNOSIS — E785 Hyperlipidemia, unspecified: Secondary | ICD-10-CM | POA: Diagnosis not present

## 2021-05-05 DIAGNOSIS — Z955 Presence of coronary angioplasty implant and graft: Secondary | ICD-10-CM | POA: Diagnosis not present

## 2021-05-05 DIAGNOSIS — I2510 Atherosclerotic heart disease of native coronary artery without angina pectoris: Secondary | ICD-10-CM | POA: Diagnosis not present

## 2021-05-05 DIAGNOSIS — I10 Essential (primary) hypertension: Secondary | ICD-10-CM | POA: Diagnosis not present

## 2021-05-10 ENCOUNTER — Telehealth: Payer: Self-pay

## 2021-05-10 NOTE — Progress Notes (Signed)
Pt need to cancel f/u appt with CPP. She stated she has so much going on and will not be home tomorrow. Her husband just go out of the hospital and wants to follow up maybe after Christmas.  [name], [place] Pharmacist Assistant  [phone]

## 2021-05-11 ENCOUNTER — Telehealth: Payer: Medicare Other

## 2021-05-12 ENCOUNTER — Other Ambulatory Visit: Payer: Self-pay | Admitting: Family Medicine

## 2021-05-12 DIAGNOSIS — S0501XA Injury of conjunctiva and corneal abrasion without foreign body, right eye, initial encounter: Secondary | ICD-10-CM | POA: Diagnosis not present

## 2021-05-12 NOTE — Telephone Encounter (Signed)
Called pt. She is no longer taking medication.   Courtney [name], [name] 05/12/21 1:24 PM

## 2021-05-20 ENCOUNTER — Telehealth: Payer: Self-pay

## 2021-05-20 NOTE — Chronic Care Management (AMB) (Signed)
Chronic Care Management Pharmacy Assistant   Name: Courtney [name]  MRN: [id number] DOB: [birth year]   Reason for Encounter: General Adherence Call     Recent office visits:  04/13/21 Courtney Brome MD. Seen for Routine visit. Started Cetirizine HCI 10 mg daily. Recommended to start Courtney [name].   03/15/21 Courtney Meeker MD. Video Visit for Bronchiectasis. Started Augmentin 875-125 mg 2 times daily.   03/10/21 Orders Only. [name], [name]. D/C Rosuvastatin 10 mg due to side effects.   Recent consult visits:  05/05/21 (Cardiology) Courtney Moh MD. Seen for CAD. Reported pt stopped Crestor.   04/08/21 (Pulmonologist) Courtney Garfinkel MD. Seen for Bronchiectasis. D/C Augmentin 125 mg and Azithromycin due to completed course.   03/02/21 (Pain Management) Courtney Cower PA- C. Seen for Chronic Pain. Ordered Oxycodone-Acetaminophen 10-325 mg every 4 hours prn. D/C Hydrocodone-Acetaminophen 10-325 mg.   03/02/21 (Pulmonologist) Orders Only. [name], [name] MD. Started Azithromycin 250 mg.   Hospital visits:  None   Medications: Outpatient Encounter Medications as of 05/20/2021  Medication Sig Note   albuterol (VENTOLIN HFA) 108 (90 Base) MCG/ACT inhaler Inhale into the lungs.    aspirin 81 MG chewable tablet Chew 81 mg by mouth daily.    budesonide-formoterol (SYMBICORT) 160-4.5 MCG/ACT inhaler Inhale 2 puffs into the lungs in the morning and at bedtime.    cetirizine (ZYRTEC) 10 MG tablet Take 1 tablet (10 mg total) by mouth daily.    docusate sodium (COLACE) 100 MG capsule Take 1 capsule (100 mg total) by mouth 2 (two) times daily. (Patient taking differently: Take 100 mg by mouth daily.)    fluticasone (FLONASE) 50 MCG/ACT nasal spray Place 2 sprays into both nostrils daily as needed for allergies.     furosemide (LASIX) 20 MG tablet TAKE 1 TABLET(20 MG) BY MOUTH DAILY    gabapentin (NEURONTIN) 600 MG tablet Take 1 tablet (600 mg total) by mouth 3 (three) times daily. (Patient  taking differently: Take 600 mg by mouth at bedtime. As needed) 11/18/2020: Patient takes 1/2 tablet twice daily due to sedation   levothyroxine (SYNTHROID) 88 MCG tablet TAKE 1 TABLET(88 MCG) BY MOUTH DAILY BEFORE AND BREAKFAST    lisinopril (ZESTRIL) 5 MG tablet Take 5 mg by mouth daily.    metoprolol tartrate (LOPRESSOR) 50 MG tablet TAKE 1 TABLET(50 MG) BY MOUTH TWICE DAILY WITH MEALS    montelukast (SINGULAIR) 10 MG tablet Take 1 tablet (10 mg total) by mouth at bedtime.    Multiple Vitamins-Minerals (MULTIVITAMIN WITH MINERALS) tablet Take 1 tablet by mouth daily.    nitroGLYCERIN (NITROSTAT) 0.4 MG SL tablet Place 1 tablet (0.4 mg total) under the tongue every 5 (five) minutes as needed for chest pain.    omeprazole (PRILOSEC) 40 MG capsule TAKE 1 CAPSULE(40 MG) BY MOUTH TWICE DAILY    ondansetron (ZOFRAN ODT) 4 MG disintegrating tablet Take 1 tablet (4 mg total) by mouth every 8 (eight) hours as needed for nausea or vomiting.    oxyCODONE-acetaminophen (PERCOCET) 10-325 MG tablet Take 1 tablet by mouth 4 (four) times daily as needed.    potassium chloride SA (KLOR-CON) 20 MEQ tablet TAKE 1 TABLET(20 MEQ) BY MOUTH DAILY    ticagrelor (BRILINTA) 90 MG TABS tablet Take 90 mg by mouth 2 (two) times daily.    tiZANidine (ZANAFLEX) 4 MG tablet Take 4 mg by mouth daily as needed.    tretinoin (RETIN-A) 0.05 % cream tretinoin 0.05 % topical cream    vitamin E 180 MG (400  UNITS) capsule Take 400 Units by mouth daily.    No facility-administered encounter medications on file as of 05/20/2021.   Contacted Courtney [name] for general disease state and medication adherence call.   Patient is > 5 days past due for refill on the following medications per chart history:  [facility] Medications: Medication Name/mg Last Fill Days Supply Lisinopril 5 mg   12/23/20  90ds   I called pt to do a check in and she stated she is doing well and her husband is now out of the hospital. She was unable to talk long due to  having to take care of sick family members but she stated she has no concerns or issues for herself now. She stated she will follow up later on with [place] and to reschedule follow up with CPP.    Care Gaps: Last annual wellness visit? None noted  If applicable:N/A Last eye exam / retinopathy screening? Diabetic foot exam?    [name], [name]  [phone]

## 2021-05-21 NOTE — Telephone Encounter (Signed)
Patient non-compliant, will let PCP know so they can address at F/U next month

## 2021-05-28 DIAGNOSIS — M48062 Spinal stenosis, lumbar region with neurogenic claudication: Secondary | ICD-10-CM | POA: Diagnosis not present

## 2021-05-28 DIAGNOSIS — M961 Postlaminectomy syndrome, not elsewhere classified: Secondary | ICD-10-CM | POA: Diagnosis not present

## 2021-05-28 DIAGNOSIS — G894 Chronic pain syndrome: Secondary | ICD-10-CM | POA: Diagnosis not present

## 2021-06-03 DIAGNOSIS — Z79899 Other long term (current) drug therapy: Secondary | ICD-10-CM | POA: Diagnosis not present

## 2021-06-04 ENCOUNTER — Telehealth: Payer: Self-pay | Admitting: Pulmonary Disease

## 2021-06-04 MED ORDER — PREDNISONE 20 MG PO TABS: 40.0000 mg | ORAL_TABLET | Freq: Every day | ORAL | 0 refills | Status: DC

## 2021-06-04 MED ORDER — AZITHROMYCIN 250 MG PO TABS: 250.0000 mg | ORAL_TABLET | ORAL | 0 refills | Status: DC

## 2021-06-04 NOTE — Telephone Encounter (Signed)
I called and spoke with the pt  She is c/o increased cough x 2 days- moderate amount of green sputum coming up  She denies any increased SOB, wheezing, f/c/s, aches, sore throat, HA, PND, sinus pressure  She has not taken covid test  She has been fully vaccinated against covid and had her flu vaccine also  I offered appt and she refused, and says she is not capable of video visit  She wanted to see if Dr Vaughan Browner would call something in  She is still taking her montelukast, symbicort, ventolin  Please advise, thanks!  Allergies  Allergen Reactions   Levofloxacin Hives, Shortness Of Breath and Swelling    RASH IN MOUTH   (can take IV route)   Bactrim [Sulfamethoxazole-Trimethoprim] Hives and Itching   Clarithromycin     GI upset Other reaction(s): GI Upset (intolerance) GI upset -can take but does cause mild upset    Lyrica [Pregabalin]    Macrobid [Nitrofurantoin]     Not tolerate   Repatha [Evolocumab]     Legs hurt.    Statins Other (See Comments)    Myalgias   Looks like she has done okay with augmentin and zithro in the past

## 2021-06-04 NOTE — Telephone Encounter (Signed)
Would advise her to get COVID and flu tested Please call in a Z-Pak Offer prednisone 40 mg a day for 5 days if she wants to take it.  But I recall that she had some side effects to steroids in the past

## 2021-06-05 DIAGNOSIS — R1084 Generalized abdominal pain: Secondary | ICD-10-CM | POA: Diagnosis not present

## 2021-06-05 DIAGNOSIS — K922 Gastrointestinal hemorrhage, unspecified: Secondary | ICD-10-CM | POA: Diagnosis not present

## 2021-06-07 ENCOUNTER — Encounter: Payer: Self-pay | Admitting: Legal Medicine

## 2021-06-07 ENCOUNTER — Ambulatory Visit (INDEPENDENT_AMBULATORY_CARE_PROVIDER_SITE_OTHER): Payer: Medicare Other | Admitting: Legal Medicine

## 2021-06-07 ENCOUNTER — Other Ambulatory Visit: Payer: Self-pay

## 2021-06-07 VITALS — BP 100/60 | HR 82 | Temp 98.3°F | Resp 16 | Ht 66.0 in | Wt 179.0 lb

## 2021-06-07 DIAGNOSIS — K5792 Diverticulitis of intestine, part unspecified, without perforation or abscess without bleeding: Secondary | ICD-10-CM | POA: Diagnosis not present

## 2021-06-07 DIAGNOSIS — K591 Functional diarrhea: Secondary | ICD-10-CM | POA: Diagnosis not present

## 2021-06-07 MED ORDER — CIPROFLOXACIN HCL 500 MG PO TABS
500.0000 mg | ORAL_TABLET | Freq: Two times a day (BID) | ORAL | 0 refills | Status: AC
Start: 2021-06-07 — End: 2021-06-17

## 2021-06-07 MED ORDER — DIPHENOXYLATE-ATROPINE 2.5-0.025 MG PO TABS: 1.0000 | ORAL_TABLET | Freq: Four times a day (QID) | ORAL | 0 refills | Status: DC | PRN

## 2021-06-07 NOTE — Progress Notes (Signed)
Acute Office Visit  Subjective:    Patient ID: Courtney [name], female    DOB: 1946-03-18, 75 y.o.   MRN: [id number]  Chief Complaint  Patient presents with   Diarrhea   Diverticulitis    HPI: Patient is in today for patient has diverticulitis over weekend.  Put on augmentin and now has diarrhea.  She has history of diverticulitis.BM 4 today.  Using immodium. Stopped augmentin last night.She has done better with cipro.  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 06/24/7260   Followed in Pulmonary clinic/  Healthcare/ [place]    Cardiac dysrhythmia    Chronic idiopathic constipation    Colitis, acute 02/08/2021   Depression, major, recurrent, moderate (HCC)    Headache    Heart murmur    History of bladder infections    History of COVID-19    Hyperlipidemia    Hypertension    Hypothyroidism    Knee pain, bilateral    Melena 02/08/2021   Osteoarthritis    Pneumonia    Primary insomnia    Recovering alcoholic (HCC)    SVT (supraventricular tachycardia) (HCC)    UTI (urinary tract infection)    Varicose veins    Vitamin B12 deficiency     Past Surgical History:  Procedure Laterality Date   ABDOMINAL HYSTERECTOMY  1991   ANGIOPLASTY     at least 15 years ago, pt. denies    APPENDECTOMY     AUGMENTATION MAMMAPLASTY Bilateral    BACK SURGERY     had 2 surgeries. Have rods placed in back    BARIATRIC SURGERY     lap band-at least 14 years ago   BREAST BIOPSY Left    x2   BREAST ENHANCEMENT SURGERY  2006   CHOLECYSTECTOMY  12/2019   removed lap band.    COLONOSCOPY  12/01/2016   Moderate predominantly sigmod diverticulosis. Otherwise grossly normal colonoscopy   EYE SURGERY     IOL- bilateral - [place]   KNEE ARTHROSCOPY Left    lap band surgery  1981   LUMBAR FUSION  07/29/2015   posterior level one   removal of cervical disc fragments  10/2007   pt.  denies     Family History  Problem Relation Age of Onset   Heart disease Mother    Heart disease Father    Diabetes Sister    Other Sister        BRAIN TUMOR   Heart disease Brother    Heart disease Brother    Heart disease Brother    Heart disease Brother    Heart disease Brother    Colon cancer Maternal Aunt    Colon cancer Cousin     Social History   Socioeconomic History   Marital status: Married    Spouse name: Not on file   Number of children: 2   Years of education: Not on file   Highest education level: Not on file  Occupational History   Occupation: retired    Comment: Marine scientist  Tobacco Use   Smoking status: Never   Smokeless tobacco: Never  Vaping Use   Vaping Use: Never used  Substance and Sexual Activity   Alcohol use: No    Alcohol/week: 0.0 standard drinks    Comment: recovery x 20 yrs   Drug use: No   Sexual activity: Not Currently  Comment: MARRIED  Other Topics Concern   Not on file  Social History Narrative   Not on file   Social Determinants of Health   Financial Resource Strain: Not on file  Food Insecurity: No Food Insecurity   Worried About Running Out of Food in the Last Year: Never true   Ran Out of Food in the Last Year: Never true  Transportation Needs: No Transportation Needs   Lack of Transportation (Medical): No   Lack of Transportation (Non-Medical): No  Physical Activity: Not on file  Stress: Not on file  Social Connections: Not on file  Intimate Partner Violence: Not on file    Outpatient Medications Prior to Visit  Medication Sig Dispense Refill   albuterol (VENTOLIN HFA) 108 (90 Base) MCG/ACT inhaler Inhale into the lungs.     aspirin 81 MG chewable tablet Chew 81 mg by mouth daily.     budesonide-formoterol (SYMBICORT) 160-4.5 MCG/ACT inhaler Inhale 2 puffs into the lungs in the morning and at bedtime. 1 each 6   cetirizine (ZYRTEC) 10 MG tablet Take 1 tablet (10 mg total) by mouth daily. 90 tablet 3   docusate  sodium (COLACE) 100 MG capsule Take 1 capsule (100 mg total) by mouth 2 (two) times daily. (Patient taking differently: Take 100 mg by mouth daily.) 60 capsule 0   fluticasone (FLONASE) 50 MCG/ACT nasal spray Place 2 sprays into both nostrils daily as needed for allergies.      furosemide (LASIX) 20 MG tablet TAKE 1 TABLET(20 MG) BY MOUTH DAILY 30 tablet 3   gabapentin (NEURONTIN) 600 MG tablet Take 1 tablet (600 mg total) by mouth 3 (three) times daily. (Patient taking differently: Take 600 mg by mouth at bedtime. As needed) 90 tablet 3   levothyroxine (SYNTHROID) 88 MCG tablet TAKE 1 TABLET(88 MCG) BY MOUTH DAILY BEFORE AND BREAKFAST 90 tablet 0   lisinopril (ZESTRIL) 5 MG tablet Take 5 mg by mouth daily.     metoprolol tartrate (LOPRESSOR) 50 MG tablet TAKE 1 TABLET(50 MG) BY MOUTH TWICE DAILY WITH MEALS 180 tablet 1   montelukast (SINGULAIR) 10 MG tablet Take 1 tablet (10 mg total) by mouth at bedtime. 90 tablet 3   Multiple Vitamins-Minerals (MULTIVITAMIN WITH MINERALS) tablet Take 1 tablet by mouth daily.     nitroGLYCERIN (NITROSTAT) 0.4 MG SL tablet Place 1 tablet (0.4 mg total) under the tongue every 5 (five) minutes as needed for chest pain. 25 tablet 0   omeprazole (PRILOSEC) 40 MG capsule TAKE 1 CAPSULE(40 MG) BY MOUTH TWICE DAILY 180 capsule 1   ondansetron (ZOFRAN ODT) 4 MG disintegrating tablet Take 1 tablet (4 mg total) by mouth every 8 (eight) hours as needed for nausea or vomiting. 20 tablet 0   oxyCODONE-acetaminophen (PERCOCET) 10-325 MG tablet Take 1 tablet by mouth 4 (four) times daily as needed.     potassium chloride SA (KLOR-CON) 20 MEQ tablet TAKE 1 TABLET(20 MEQ) BY MOUTH DAILY 30 tablet 3   predniSONE (DELTASONE) 20 MG tablet Take 2 tablets (40 mg total) by mouth daily with breakfast. 10 tablet 0   ticagrelor (BRILINTA) 90 MG TABS tablet Take 90 mg by mouth 2 (two) times daily.     tiZANidine (ZANAFLEX) 4 MG tablet Take 4 mg by mouth daily as needed.     tiZANidine  (ZANAFLEX) 4 MG tablet Take by mouth.     tretinoin (RETIN-A) 0.05 % cream tretinoin 0.05 % topical cream     vitamin E 180 MG (400  UNITS) capsule Take 400 Units by mouth daily.     azithromycin (ZITHROMAX) 250 MG tablet Take 1 tablet (250 mg total) by mouth as directed. 6 tablet 0   No facility-administered medications prior to visit.    Allergies  Allergen Reactions   Levofloxacin Hives, Shortness Of Breath and Swelling    RASH IN MOUTH   (can take IV route)   Bactrim [Sulfamethoxazole-Trimethoprim] Hives and Itching   Clarithromycin     GI upset Other reaction(s): GI Upset (intolerance) GI upset -can take but does cause mild upset    Lyrica [Pregabalin]    Macrobid [Nitrofurantoin]     Not tolerate   Repatha [Evolocumab]     Legs hurt.    Statins Other (See Comments)    Myalgias    Review of Systems  Constitutional:  Negative for chills, fatigue and fever.  HENT:  Negative for congestion, ear pain and sore throat.   Respiratory:  Negative for cough and shortness of breath.   Cardiovascular:  Negative for chest pain and palpitations.  Gastrointestinal:  Positive for abdominal pain and diarrhea. Negative for constipation, nausea and vomiting.  Endocrine: Negative for polydipsia, polyphagia and polyuria.  Genitourinary:  Negative for difficulty urinating and dysuria.  Musculoskeletal:  Negative for arthralgias, back pain and myalgias.  Skin:  Negative for rash.  Neurological:  Negative for headaches.  Psychiatric/Behavioral: Negative.  Negative for dysphoric mood. The patient is not nervous/anxious.       Objective:    Physical Exam Vitals reviewed.  Constitutional:      General: She is not in acute distress.    Appearance: Normal appearance.  HENT:     Right Ear: Tympanic membrane, ear canal and external ear normal.     Left Ear: Tympanic membrane, ear canal and external ear normal.     Mouth/Throat:     Mouth: Mucous membranes are moist.  Eyes:     Extraocular  Movements: Extraocular movements intact.     Conjunctiva/sclera: Conjunctivae normal.     Pupils: Pupils are equal, round, and reactive to light.  Cardiovascular:     Rate and Rhythm: Normal rate and regular rhythm.     Pulses: Normal pulses.     Heart sounds: Normal heart sounds. No murmur heard.   No gallop.  Pulmonary:     Effort: Pulmonary effort is normal. No respiratory distress.     Breath sounds: Normal breath sounds. No wheezing.  Abdominal:     General: Abdomen is flat. Bowel sounds are normal. There is no distension.     Palpations: Abdomen is soft.     Tenderness: There is abdominal tenderness.  Musculoskeletal:        General: Normal range of motion.     Cervical back: Normal range of motion and neck supple.     Right lower leg: No edema.     Left lower leg: No edema.  Skin:    General: Skin is warm.     Capillary Refill: Capillary refill takes less than 2 seconds.  Neurological:     General: No focal deficit present.     Mental Status: She is alert and oriented to person, place, and time.     Gait: Gait normal.     Deep Tendon Reflexes: Reflexes normal.  Psychiatric:        Mood and Affect: Mood normal.    BP 100/60    Pulse 82    Temp 98.3 F (36.8 C)    Resp  16    Ht '5\' 6"'  (1.676 m)    Wt 179 lb (81.2 kg)    SpO2 98%    BMI 28.89 kg/m  Wt Readings from Last 3 Encounters:  06/07/21 179 lb (81.2 kg)  04/13/21 172 lb (78 kg)  04/08/21 174 lb (78.9 kg)    Health Maintenance Due  Topic Date Due   TETANUS/TDAP  Never done   Zoster Vaccines- Shingrix (1 of 2) Never done   COVID-19 Vaccine (3 - Mixed Product risk series) 09/09/2019    There are no preventive care reminders to display for this patient.   Lab Results  Component Value Date   TSH 1.630 02/08/2021   Lab Results  Component Value Date   WBC 7.1 04/13/2021   HGB 12.4 04/13/2021   HCT 37.6 04/13/2021   MCV 93 04/13/2021   PLT 223 04/13/2021   Lab Results  Component Value Date   NA 137  04/13/2021   K 5.0 04/13/2021   CO2 23 04/13/2021   GLUCOSE 102 (H) 04/13/2021   BUN 20 04/13/2021   CREATININE 1.13 (H) 04/13/2021   BILITOT 0.3 04/13/2021   ALKPHOS 174 (H) 04/13/2021   AST 21 04/13/2021   ALT 24 04/13/2021   PROT 7.1 04/13/2021   ALBUMIN 4.6 04/13/2021   CALCIUM 9.8 04/13/2021   ANIONGAP 9 09/04/2018   EGFR 51 (L) 04/13/2021   Lab Results  Component Value Date   CHOL 267 (H) 04/13/2021   Lab Results  Component Value Date   HDL 46 04/13/2021   Lab Results  Component Value Date   LDLCALC 190 (H) 04/13/2021   Lab Results  Component Value Date   TRIG 164 (H) 04/13/2021   Lab Results  Component Value Date   CHOLHDL 5.8 (H) 04/13/2021   Lab Results  Component Value Date   HGBA1C 5.9 (H) 04/13/2021       Assessment & Plan:   Diagnoses and all orders for this visit: Diverticulitis -     ciprofloxacin (CIPRO) 500 MG tablet; Take 1 tablet (500 mg total) by mouth 2 (two) times daily for 10 days. Patient has diverticulitis, stop augmentin and start cipro  Functional diarrhea -     diphenoxylate-atropine (LOMOTIL) 2.5-0.025 MG tablet; Take 1 tablet by mouth 4 (four) times daily as needed for diarrhea or loose stools. Use lomotil for diarrhea        Follow-up: Return if symptoms worsen or fail to improve.  An After Visit Summary was printed and given to the patient.  [name], MD [name] [phone]

## 2021-06-09 ENCOUNTER — Other Ambulatory Visit: Payer: Self-pay | Admitting: Family Medicine

## 2021-06-10 ENCOUNTER — Other Ambulatory Visit: Payer: Self-pay | Admitting: Physician Assistant

## 2021-06-15 ENCOUNTER — Other Ambulatory Visit: Payer: Self-pay | Admitting: Family Medicine

## 2021-06-25 ENCOUNTER — Telehealth (INDEPENDENT_AMBULATORY_CARE_PROVIDER_SITE_OTHER): Payer: Medicare Other | Admitting: Legal Medicine

## 2021-06-25 ENCOUNTER — Encounter: Payer: Self-pay | Admitting: Legal Medicine

## 2021-06-25 VITALS — BP 130/70 | Temp 101.6°F | Ht 66.0 in | Wt 179.0 lb

## 2021-06-25 DIAGNOSIS — J471 Bronchiectasis with (acute) exacerbation: Secondary | ICD-10-CM

## 2021-06-25 MED ORDER — IPRATROPIUM BROMIDE 0.02 % IN SOLN: 0.5000 mg | Freq: Four times a day (QID) | RESPIRATORY_TRACT | 12 refills | Status: DC

## 2021-06-25 MED ORDER — AMOXICILLIN-POT CLAVULANATE 875-125 MG PO TABS: 1.0000 | ORAL_TABLET | Freq: Two times a day (BID) | ORAL | 0 refills | Status: DC

## 2021-06-25 NOTE — Progress Notes (Signed)
Virtual Visit via Video Note   This visit type was conducted due to national recommendations for restrictions regarding the COVID-19 Pandemic (e.g. social distancing) in an effort to limit this patient's exposure and mitigate transmission in our community.  Due to her co-morbid illnesses, this patient is at least at moderate risk for complications without adequate follow up.  This format is felt to be most appropriate for this patient at this time.  All issues noted in this document were discussed and addressed.  A limited physical exam was performed with this format.  A verbal consent was obtained for the virtual visit.   Date:  06/25/2021   ID:  Courtney [name], DOB [birth year], MRN [id number]  Patient Location: Home Provider Location: Office/Clinic  PCP:  [name], MD   Evaluation Performed:  New Patient Evaluation  Chief Complaint:  husband has RSV  History of Present Illness:    Courtney [name] is a 76 y.o. female with husband has rsv, temp 104.6, increases cough, green sputum.  The patient does not have symptoms concerning for COVID-19 infection (fever, chills, cough, or new shortness of breath).    Past Medical History:  Diagnosis Date   Anemia    Arthritis    Bronchiectasis ([place])    CAP (community acquired pneumonia)  vs Eosinophilic Pna 01/26/3809   Followed in Pulmonary clinic/ [facility]/ [place]    Cardiac dysrhythmia    Chronic idiopathic constipation    Colitis, acute 02/08/2021   Depression, major, recurrent, moderate (HCC)    Headache    Heart murmur    History of bladder infections    History of COVID-19    Hyperlipidemia    Hypertension    Hypothyroidism    Knee pain, bilateral    Melena 02/08/2021   Osteoarthritis    Pneumonia    Primary insomnia    Recovering alcoholic (HCC)    SVT (supraventricular tachycardia) (HCC)    UTI (urinary tract infection)    Varicose veins    Vitamin B12 deficiency     Past Surgical History:  Procedure  Laterality Date   ABDOMINAL HYSTERECTOMY  1991   ANGIOPLASTY     at least 15 years ago, pt. denies    APPENDECTOMY     AUGMENTATION MAMMAPLASTY Bilateral    BACK SURGERY     had 2 surgeries. Have rods placed in back    BARIATRIC SURGERY     lap band-at least 14 years ago   BREAST BIOPSY Left    x2   BREAST ENHANCEMENT SURGERY  2006   CHOLECYSTECTOMY  12/2019   removed lap band.    COLONOSCOPY  12/01/2016   Moderate predominantly sigmod diverticulosis. Otherwise grossly normal colonoscopy   EYE SURGERY     IOL- bilateral - [place]   KNEE ARTHROSCOPY Left    lap band surgery  1981   LUMBAR FUSION  07/29/2015   posterior level one   removal of cervical disc fragments  10/2007   pt. denies     Family History  Problem Relation Age of Onset   Heart disease Mother    Heart disease Father    Diabetes Sister    Other Sister        BRAIN TUMOR   Heart disease Brother    Heart disease Brother    Heart disease Brother    Heart disease Brother    Heart disease Brother    Colon cancer Maternal Aunt    Colon cancer Cousin  Social History   Socioeconomic History   Marital status: Married    Spouse name: Not on file   Number of children: 2   Years of education: Not on file   Highest education level: Not on file  Occupational History   Occupation: retired    Comment: Marine scientist  Tobacco Use   Smoking status: Never   Smokeless tobacco: Never  Vaping Use   Vaping Use: Never used  Substance and Sexual Activity   Alcohol use: No    Alcohol/week: 0.0 standard drinks    Comment: recovery x 20 yrs   Drug use: No   Sexual activity: Not Currently    Comment: MARRIED  Other Topics Concern   Not on file  Social History Narrative   Not on file   Social Determinants of Health   Financial Resource Strain: Not on file  Food Insecurity: No Food Insecurity   Worried About Charity fundraiser in the Last Year: Never true   Gay in the Last Year: Never true   Transportation Needs: No Transportation Needs   Lack of Transportation (Medical): No   Lack of Transportation (Non-Medical): No  Physical Activity: Not on file  Stress: Not on file  Social Connections: Not on file  Intimate Partner Violence: Not on file    Outpatient Medications Prior to Visit  Medication Sig Dispense Refill   albuterol (VENTOLIN HFA) 108 (90 Base) MCG/ACT inhaler Inhale into the lungs.     aspirin 81 MG chewable tablet Chew 81 mg by mouth daily.     budesonide-formoterol (SYMBICORT) 160-4.5 MCG/ACT inhaler Inhale 2 puffs into the lungs in the morning and at bedtime. 1 each 6   cetirizine (ZYRTEC) 10 MG tablet Take 1 tablet (10 mg total) by mouth daily. 90 tablet 3   diphenoxylate-atropine (LOMOTIL) 2.5-0.025 MG tablet Take 1 tablet by mouth 4 (four) times daily as needed for diarrhea or loose stools. 30 tablet 0   docusate sodium (COLACE) 100 MG capsule Take 1 capsule (100 mg total) by mouth 2 (two) times daily. (Patient taking differently: Take 100 mg by mouth daily.) 60 capsule 0   fluticasone (FLONASE) 50 MCG/ACT nasal spray Place 2 sprays into both nostrils daily as needed for allergies.      furosemide (LASIX) 20 MG tablet TAKE 1 TABLET(20 MG) BY MOUTH DAILY 30 tablet 3   gabapentin (NEURONTIN) 600 MG tablet Take 1 tablet (600 mg total) by mouth at bedtime. As needed 90 tablet 1   levothyroxine (SYNTHROID) 88 MCG tablet TAKE 1 TABLET(88 MCG) BY MOUTH DAILY BEFORE AND BREAKFAST 90 tablet 0   lisinopril (ZESTRIL) 5 MG tablet Take 5 mg by mouth daily.     metoprolol tartrate (LOPRESSOR) 50 MG tablet TAKE 1 TABLET(50 MG) BY MOUTH TWICE DAILY WITH MEALS 180 tablet 1   montelukast (SINGULAIR) 10 MG tablet Take 1 tablet (10 mg total) by mouth at bedtime. 90 tablet 3   Multiple Vitamins-Minerals (MULTIVITAMIN WITH MINERALS) tablet Take 1 tablet by mouth daily.     nitroGLYCERIN (NITROSTAT) 0.4 MG SL tablet Place 1 tablet (0.4 mg total) under the tongue every 5 (five) minutes  as needed for chest pain. 25 tablet 0   omeprazole (PRILOSEC) 40 MG capsule TAKE 1 CAPSULE(40 MG) BY MOUTH TWICE DAILY 180 capsule 1   ondansetron (ZOFRAN ODT) 4 MG disintegrating tablet Take 1 tablet (4 mg total) by mouth every 8 (eight) hours as needed for nausea or vomiting. 20 tablet 0  oxyCODONE-acetaminophen (PERCOCET) 10-325 MG tablet Take by mouth.     potassium chloride SA (KLOR-CON M) 20 MEQ tablet TAKE 1 TABLET(20 MEQ) BY MOUTH DAILY 30 tablet 3   predniSONE (DELTASONE) 20 MG tablet Take 2 tablets (40 mg total) by mouth daily with breakfast. 10 tablet 0   ticagrelor (BRILINTA) 90 MG TABS tablet Take 90 mg by mouth 2 (two) times daily.     tiZANidine (ZANAFLEX) 4 MG tablet Take 4 mg by mouth daily as needed.     tiZANidine (ZANAFLEX) 4 MG tablet Take by mouth.     tretinoin (RETIN-A) 0.05 % cream tretinoin 0.05 % topical cream     vitamin E 180 MG (400 UNITS) capsule Take 400 Units by mouth daily.     No facility-administered medications prior to visit.    Allergies:   Levofloxacin, Bactrim [sulfamethoxazole-trimethoprim], Clarithromycin, Lyrica [pregabalin], Macrobid [nitrofurantoin], Repatha [evolocumab], and Statins   Social History   Tobacco Use   Smoking status: Never   Smokeless tobacco: Never  Vaping Use   Vaping Use: Never used  Substance Use Topics   Alcohol use: No    Alcohol/week: 0.0 standard drinks    Comment: recovery x 20 yrs   Drug use: No     Review of Systems  Constitutional:  Positive for fever and malaise/fatigue.  HENT:  Positive for congestion.   Respiratory:  Positive for cough and sputum production.   Cardiovascular:  Negative for chest pain and claudication.  Gastrointestinal: Negative.   Genitourinary:  Negative for dysuria.  Musculoskeletal:  Negative for myalgias.  Neurological: Negative.   Psychiatric/Behavioral:  Negative for depression.     Labs/Other Tests and Data Reviewed:    Recent Labs: 02/08/2021: TSH 1.630 04/13/2021: ALT  24; BUN 20; Creatinine, Ser 1.13; Hemoglobin 12.4; Magnesium 2.1; Platelets 223; Potassium 5.0; Sodium 137   Recent Lipid Panel Lab Results  Component Value Date/Time   CHOL 267 (H) 04/13/2021 09:08 AM   TRIG 164 (H) 04/13/2021 09:08 AM   HDL 46 04/13/2021 09:08 AM   CHOLHDL 5.8 (H) 04/13/2021 09:08 AM   CHOLHDL 4.2 09/20/2009 11:00 PM   LDLCALC 190 (H) 04/13/2021 09:08 AM    Wt Readings from Last 3 Encounters:  06/25/21 179 lb (81.2 kg)  06/07/21 179 lb (81.2 kg)  04/13/21 172 lb (78 kg)     Objective:    Vital Signs:  BP 130/70    Temp (!) 101.6 F (38.7 C)    Ht 5\' 6"  (1.676 m)    Wt 179 lb (81.2 kg)    BMI 28.89 kg/m    Physical Exam reviewed  ASSESSMENT & PLAN:   Diagnoses and all orders for this visit: Bronchiectasis with (acute) exacerbation (HCC) -     amoxicillin-clavulanate (AUGMENTIN) 875-125 MG tablet; Take 1 tablet by mouth 2 (two) times daily. -     ipratropium (ATROVENT) 0.02 % nebulizer solution; Take 2.5 mLs (0.5 mg total) by nebulization 4 (four) times daily. Patient has bronchiectasis and will be treated with augmentin         COVID-19 Education: The signs and symptoms of COVID-19 were discussed with the patient and how to seek care for testing (follow up with PCP or arrange E-visit). The importance of social distancing was discussed today.   I spent 20 minutes dedicated to the care of this patient on the date of this encounter to include face-to-face time with the patient, as well as:   Follow Up:  In Person prn  Signed,  [name], MD  06/25/2021 11:00 AM    [place]

## 2021-06-28 ENCOUNTER — Telehealth: Payer: Self-pay

## 2021-06-28 ENCOUNTER — Other Ambulatory Visit: Payer: Self-pay

## 2021-06-28 DIAGNOSIS — J471 Bronchiectasis with (acute) exacerbation: Secondary | ICD-10-CM

## 2021-06-28 MED ORDER — IPRATROPIUM BROMIDE 0.02 % IN SOLN: 0.5000 mg | Freq: Four times a day (QID) | RESPIRATORY_TRACT | 12 refills | Status: DC

## 2021-06-28 NOTE — Telephone Encounter (Signed)
Laekyn called to report that she has been taking the Augmentin as prescribed.  Her fever has resolved but she continue to cough and have congestion.  Chest xray order faxed to [facility].

## 2021-06-29 ENCOUNTER — Encounter: Payer: Self-pay | Admitting: Family Medicine

## 2021-06-29 DIAGNOSIS — M419 Scoliosis, unspecified: Secondary | ICD-10-CM | POA: Diagnosis not present

## 2021-06-29 DIAGNOSIS — R059 Cough, unspecified: Secondary | ICD-10-CM | POA: Diagnosis not present

## 2021-06-29 DIAGNOSIS — R918 Other nonspecific abnormal finding of lung field: Secondary | ICD-10-CM | POA: Diagnosis not present

## 2021-06-30 ENCOUNTER — Telehealth: Payer: Self-pay

## 2021-06-30 NOTE — Telephone Encounter (Signed)
Called patient giving her the chest xray results and they were normal and un changed. Patient states that she has two more days left of the antibiotic and she states her cough is "terrible" and she does not feel any better. She said she cant take a deep breath without having to cough 5-10 times and states also that she has been coughing up green balls of snot. She states she has the robotussin and she has the pearls and its not helping neither. Please advise.

## 2021-06-30 NOTE — Telephone Encounter (Signed)
Recommend pt call pulmonologist for an appt this week. Kc

## 2021-06-30 NOTE — Telephone Encounter (Signed)
Made pt aware. She will call pulmonology.   Royce Macadamia, [place] 06/30/21 3:15 PM

## 2021-07-02 ENCOUNTER — Ambulatory Visit (INDEPENDENT_AMBULATORY_CARE_PROVIDER_SITE_OTHER): Payer: Medicare Other | Admitting: Primary Care

## 2021-07-02 ENCOUNTER — Other Ambulatory Visit: Payer: Self-pay

## 2021-07-02 ENCOUNTER — Encounter: Payer: Self-pay | Admitting: Primary Care

## 2021-07-02 VITALS — BP 106/62 | HR 71 | Temp 98.1°F | Ht 65.0 in | Wt 178.0 lb

## 2021-07-02 DIAGNOSIS — J471 Bronchiectasis with (acute) exacerbation: Secondary | ICD-10-CM

## 2021-07-02 DIAGNOSIS — R6889 Other general symptoms and signs: Secondary | ICD-10-CM | POA: Diagnosis not present

## 2021-07-02 LAB — POCT INFLUENZA A/B
Influenza A, POC: NEGATIVE
Influenza B, POC: NEGATIVE

## 2021-07-02 LAB — POC COVID19 BINAXNOW: SARS Coronavirus 2 Ag: NEGATIVE

## 2021-07-02 MED ORDER — CIPROFLOXACIN HCL 500 MG PO TABS: 500.0000 mg | ORAL_TABLET | Freq: Two times a day (BID) | ORAL | 0 refills | Status: DC

## 2021-07-02 MED ORDER — METHYLPREDNISOLONE ACETATE 80 MG/ML IJ SUSP: 80.0000 mg | Freq: Once | INTRAMUSCULAR | Status: DC

## 2021-07-02 MED ORDER — PREDNISONE 10 MG PO TABS: ORAL_TABLET | ORAL | 0 refills | Status: DC

## 2021-07-02 NOTE — Progress Notes (Signed)
@Patient  ID: Courtney [name], female    DOB: [birth year], 76 y.o.   MRN: [id number]  Chief Complaint  Patient presents with   Follow-up    Husband tested pos for rsv 2wks ago    Referring provider: [name], MD  HPI: 76 year old,never smoked. PMH significant for bronchiectasis, allergic rhinitis, CAD, hypothyroidism. Patient of Dr. Vaughan Browner, last seen in October 2022. Positive for covid in September 2022. Maintained on Symbicort 160 two puffs twice daily. Treated for acute COPD exacerbation in December 2022 with zpack and prednisone 40mg  x 5 days.   07/02/2021 Patient presents today for acute OV. Her husband tested positive for RSV two weeks ago. She developed productive cough with green mucus shortly around the same time. Her PCP sent in a 10 day course of Augmentin, today is her last day on abx. Her symptoms are no better. She continues to have a productive cough with associated wheezing. She is taking mucinex once daily. Maintained on Symbicort 160 two puffs twice daily. No fevers.    Allergies  Allergen Reactions   Levofloxacin Hives, Shortness Of Breath and Swelling    RASH IN MOUTH   (can take IV route)   Bactrim [Sulfamethoxazole-Trimethoprim] Hives and Itching   Clarithromycin     GI upset Other reaction(s): GI Upset (intolerance) GI upset -can take but does cause mild upset    Lyrica [Pregabalin]    Macrobid [Nitrofurantoin]     Not tolerate   Repatha [Evolocumab]     Legs hurt.    Statins Other (See Comments)    Myalgias    Immunization History  Administered Date(s) Administered   Fluad Quad(high Dose 65+) 04/13/2021   Influenza, High Dose Seasonal PF 04/11/2017, 03/26/2018   Influenza,inj,Quad PF,6+ Mos 02/22/2014, 03/23/2015, 02/19/2019   Influenza-Unspecified 02/22/2014   Moderna Sars-Covid-2 Vaccination 07/22/2019, 08/12/2019   Pneumococcal Conjugate-13 02/16/2017   Pneumococcal Polysaccharide-23 04/07/2015   Zoster, Live 04/13/2006    Past Medical  History:  Diagnosis Date   Anemia    Arthritis    Bronchiectasis ([place])    CAP (community acquired pneumonia)  vs Eosinophilic Pna 7/0/1779   Followed in Pulmonary clinic/ [facility]/ [place]    Cardiac dysrhythmia    Chronic idiopathic constipation    Colitis, acute 02/08/2021   Depression, major, recurrent, moderate (HCC)    Headache    Heart murmur    History of bladder infections    History of COVID-19    Hyperlipidemia    Hypertension    Hypothyroidism    Knee pain, bilateral    Melena 02/08/2021   Osteoarthritis    Pneumonia    Primary insomnia    Recovering alcoholic (HCC)    SVT (supraventricular tachycardia) (HCC)    UTI (urinary tract infection)    Varicose veins    Vitamin B12 deficiency     Tobacco History: Social History   Tobacco Use  Smoking Status Never  Smokeless Tobacco Never   Counseling given: Not Answered   Outpatient Medications Prior to Visit  Medication Sig Dispense Refill   albuterol (VENTOLIN HFA) 108 (90 Base) MCG/ACT inhaler Inhale into the lungs.     aspirin 81 MG chewable tablet Chew 81 mg by mouth daily.     budesonide-formoterol (SYMBICORT) 160-4.5 MCG/ACT inhaler Inhale 2 puffs into the lungs in the morning and at bedtime. 1 each 6   cetirizine (ZYRTEC) 10 MG tablet Take 1 tablet (10 mg total) by mouth daily. 90 tablet 3   Cholecalciferol (D3-50  PO) Take by mouth.     gabapentin (NEURONTIN) 600 MG tablet Take 1 tablet (600 mg total) by mouth at bedtime. As needed 90 tablet 1   ipratropium (ATROVENT) 0.02 % nebulizer solution Take 2.5 mLs (0.5 mg total) by nebulization 4 (four) times daily. 100 mL 12   lisinopril (ZESTRIL) 5 MG tablet Take 5 mg by mouth daily.     Menaquinone-7 (K2 PO) Take by mouth.     metoprolol tartrate (LOPRESSOR) 50 MG tablet TAKE 1 TABLET(50 MG) BY MOUTH TWICE DAILY WITH MEALS 180 tablet 1   montelukast (SINGULAIR) 10 MG tablet Take 1 tablet (10 mg total) by mouth at bedtime. 90 tablet 3   Multiple  Vitamins-Minerals (MULTIVITAMIN WITH MINERALS) tablet Take 1 tablet by mouth daily.     oxyCODONE-acetaminophen (PERCOCET) 10-325 MG tablet Take by mouth.     potassium chloride SA (KLOR-CON M) 20 MEQ tablet TAKE 1 TABLET(20 MEQ) BY MOUTH DAILY 30 tablet 3   tiZANidine (ZANAFLEX) 4 MG tablet Take 4 mg by mouth daily as needed.     tretinoin (RETIN-A) 0.05 % cream tretinoin 0.05 % topical cream     vitamin E 180 MG (400 UNITS) capsule Take 400 Units by mouth daily.     furosemide (LASIX) 20 MG tablet TAKE 1 TABLET(20 MG) BY MOUTH DAILY 30 tablet 3   levothyroxine (SYNTHROID) 88 MCG tablet TAKE 1 TABLET(88 MCG) BY MOUTH DAILY BEFORE AND BREAKFAST 90 tablet 0   amoxicillin-clavulanate (AUGMENTIN) 875-125 MG tablet Take 1 tablet by mouth 2 (two) times daily. (Patient not taking: Reported on 07/02/2021) 20 tablet 0   diphenoxylate-atropine (LOMOTIL) 2.5-0.025 MG tablet Take 1 tablet by mouth 4 (four) times daily as needed for diarrhea or loose stools. (Patient not taking: Reported on 07/02/2021) 30 tablet 0   docusate sodium (COLACE) 100 MG capsule Take 1 capsule (100 mg total) by mouth 2 (two) times daily. (Patient not taking: Reported on 07/02/2021) 60 capsule 0   fluticasone (FLONASE) 50 MCG/ACT nasal spray Place 2 sprays into both nostrils daily as needed for allergies.  (Patient not taking: Reported on 07/02/2021)     nitroGLYCERIN (NITROSTAT) 0.4 MG SL tablet Place 1 tablet (0.4 mg total) under the tongue every 5 (five) minutes as needed for chest pain. (Patient not taking: Reported on 07/02/2021) 25 tablet 0   omeprazole (PRILOSEC) 40 MG capsule TAKE 1 CAPSULE(40 MG) BY MOUTH TWICE DAILY (Patient not taking: Reported on 07/02/2021) 180 capsule 1   ondansetron (ZOFRAN ODT) 4 MG disintegrating tablet Take 1 tablet (4 mg total) by mouth every 8 (eight) hours as needed for nausea or vomiting. (Patient not taking: Reported on 07/02/2021) 20 tablet 0   ticagrelor (BRILINTA) 90 MG TABS tablet Take 90 mg by mouth  2 (two) times daily. (Patient not taking: Reported on 07/02/2021)     tiZANidine (ZANAFLEX) 4 MG tablet Take by mouth. (Patient not taking: Reported on 07/02/2021)     predniSONE (DELTASONE) 20 MG tablet Take 2 tablets (40 mg total) by mouth daily with breakfast. (Patient not taking: Reported on 07/02/2021) 10 tablet 0   No facility-administered medications prior to visit.    Review of Systems  Review of Systems  Constitutional: Negative.   HENT: Negative.    Respiratory:  Positive for cough and wheezing.     Physical Exam  BP 106/62 (BP Location: Left Arm, Cuff Size: Normal)    Pulse 71    Temp 98.1 F (36.7 C) (Oral)    Ht  5\' 5"  (1.651 m)    Wt 178 lb (80.7 kg)    SpO2 96%    BMI 29.62 kg/m  Physical Exam Constitutional:      Appearance: Normal appearance.  HENT:     Head: Normocephalic and atraumatic.  Cardiovascular:     Rate and Rhythm: Normal rate and regular rhythm.  Pulmonary:     Effort: Pulmonary effort is normal.     Breath sounds: Normal breath sounds.  Neurological:     General: No focal deficit present.     Mental Status: She is alert and oriented to person, place, and time. Mental status is at baseline.  Psychiatric:        Mood and Affect: Mood normal.        Behavior: Behavior normal.        Thought Content: Thought content normal.        Judgment: Judgment normal.     Lab Results:  CBC    Component Value Date/Time   WBC 7.1 04/13/2021 0908   WBC 13.4 (H) 09/04/2018 0353   RBC 4.03 04/13/2021 0908   RBC 3.69 (L) 09/04/2018 0353   HGB 12.4 04/13/2021 0908   HCT 37.6 04/13/2021 0908   PLT 223 04/13/2021 0908   MCV 93 04/13/2021 0908   MCH 30.8 04/13/2021 0908   MCH 29.5 09/04/2018 0353   MCHC 33.0 04/13/2021 0908   MCHC 31.7 09/04/2018 0353   RDW 12.1 04/13/2021 0908   LYMPHSABS 2.1 04/13/2021 0908   MONOABS 0.7 03/20/2018 1255   EOSABS 1.1 (H) 04/13/2021 0908   BASOSABS 0.1 04/13/2021 0908    BMET    Component Value Date/Time   NA 137  04/13/2021 0908   K 5.0 04/13/2021 0908   CL 98 04/13/2021 0908   CO2 23 04/13/2021 0908   GLUCOSE 102 (H) 04/13/2021 0908   GLUCOSE 141 (H) 09/04/2018 0353   BUN 20 04/13/2021 0908   CREATININE 1.13 (H) 04/13/2021 0908   CALCIUM 9.8 04/13/2021 0908   GFRNONAA 66 02/17/2020 1638   GFRAA 76 02/17/2020 1638    BNP No results found for: BNP  ProBNP No results found for: PROBNP  Imaging: No results found.   Assessment & Plan:   Bronchiectasis with (acute) exacerbation (HCC) - Acute exacerbation bronchiectasis secondary RSV exposure. Patient developed productive cough 2 weeks ago with associated wheezing. No improvement with 10 day course Augmentin. Influenza and covid swab were negative today. Received depo-medrol 80mg  IM shot today in office. Sending in RX cipro 500mg  BID x 10 days and prednisone taper. Advised she increase mucinex 1200mg  twice daily x  7-10 days and use ipratropium nebulizer 2-3 times a day followed by flutter valve. Encourage she use therapy vest as well if able. Continue Symbicort 160 two puffs twice daily. Patient to call if symptoms do not improve or worsen with above plan.    Martyn Ehrich, NP 07/13/2021

## 2021-07-02 NOTE — Patient Instructions (Addendum)
Recommendation: Continue Symbicort 160 two puffs twice daily  Use ipratropium nebulizer 2-3 times a day followed by flutter valve Increase mucinex 1200mg  twice daily x 7-10 days If able use vest 20 mins twice a day for the next 5-7 days  Rx: Depo medrol 80mg  IM today in office  Cipro 500mg  BID x 10 days (sent) Prednisone taper as directed (sent- start tomorrow)  Follow-up: Call if not better

## 2021-07-09 ENCOUNTER — Other Ambulatory Visit: Payer: Self-pay | Admitting: Family Medicine

## 2021-07-09 ENCOUNTER — Telehealth: Payer: Self-pay | Admitting: Primary Care

## 2021-07-09 NOTE — Telephone Encounter (Signed)
Called patient and she informed us that she is still not feeling well. She states she has congestion, fatigue and cough. No fever noted from patient. Her oxygen level is 94%. She states that she was given a sulfa drug in the past and that helped her a lot.   Beth please advise

## 2021-07-09 NOTE — Telephone Encounter (Signed)
She has been on two abx already, Augmentin and Cipro, if symptoms were bacterial should have seen improvement on one of these. Could be viral and why not responding. I would have her come in for CXR and get respiratory sputum culture if she still has a cough. Otherwise continue mucinex 1200mg  twice a day, vest and flutter valve.

## 2021-07-09 NOTE — Telephone Encounter (Signed)
Called and spoke with patient and gave her Beths recommendations. She states that she will call back on Monday if she is not feeling better to get a chest xray and give sample of sputum. Nothing further needed at this time

## 2021-07-13 NOTE — Assessment & Plan Note (Signed)
-   Acute exacerbation bronchiectasis secondary RSV exposure. Patient developed productive cough 2 weeks ago with associated wheezing. No improvement with 10 day course Augmentin. Influenza and covid swab were negative today. Received depo-medrol 80mg  IM shot today in office. Sending in RX cipro 500mg  BID x 10 days and prednisone taper. Advised she increase mucinex 1200mg  twice daily x  7-10 days and use ipratropium nebulizer 2-3 times a day followed by flutter valve. Encourage she use therapy vest as well if able. Continue Symbicort 160 two puffs twice daily. Patient to call if symptoms do not improve or worsen with above plan.

## 2021-07-18 NOTE — Progress Notes (Signed)
Subjective:  Patient ID: Courtney [name], female    DOB: 1946/02/03  Age: 76 y.o. MRN: [id number]  Chief Complaint  Patient presents with   Hyperlipidemia   Hypothyroidism    HPI Prediabetes: Patient is eating low carbs diet. Last a1c 5.9.  Hyperlipidemia: She currently is eating healthy and low fat diet. Hypothyroidism: She takes levothyroxine 88 mcg daily. TSH: 1.630. Patient is upset about weight gain and about her husband has seemed to give up on life. He has stopped going to dialysis. Previously tried wellbutrin.  CORONARY ARTERY DISEASE: on Aspirin 81 mg daily, Metoprolol tartrate 50 mg twice daily and on lisinopril 5 mg daily. Lasix 20 mg once daily. POTASSIUM CHLORIDE 20 meq once daily. Intolerant to statins and repatha, Scheduled to see [facility], [facility].  Bronchiolitis: on symbicort 2 pufss twice daily, singulair 10 mg before bed, ans zyrtec 10 mg daily. Has ventolin 2 puffs four times a day prn wheezing   Diet: felt like she was doing well, but gained 8 lbs since October 2022.  Exercising: no Current Outpatient Medications on File Prior to Visit  Medication Sig Dispense Refill   albuterol (VENTOLIN HFA) 108 (90 Base) MCG/ACT inhaler Inhale into the lungs.     aspirin 81 MG chewable tablet Chew 81 mg by mouth daily.     budesonide-formoterol (SYMBICORT) 160-4.5 MCG/ACT inhaler Inhale 2 puffs into the lungs in the morning and at bedtime. 1 each 6   cetirizine (ZYRTEC) 10 MG tablet Take 1 tablet (10 mg total) by mouth daily. 90 tablet 3   Cholecalciferol (D3-50 PO) Take by mouth.     furosemide (LASIX) 20 MG tablet TAKE 1 TABLET(20 MG) BY MOUTH DAILY 90 tablet 1   gabapentin (NEURONTIN) 600 MG tablet Take 1 tablet (600 mg total) by mouth at bedtime. As needed 90 tablet 1   ipratropium (ATROVENT) 0.02 % nebulizer solution Take 2.5 mLs (0.5 mg total) by nebulization 4 (four) times daily. 100 mL 12   levothyroxine (SYNTHROID) 88 MCG tablet TAKE 1 TABLET(88 MCG) BY MOUTH DAILY  BEFORE AND BREAKFAST 90 tablet 2   lisinopril (ZESTRIL) 5 MG tablet Take 5 mg by mouth daily.     Menaquinone-7 (K2 PO) Take by mouth.     metoprolol tartrate (LOPRESSOR) 50 MG tablet TAKE 1 TABLET(50 MG) BY MOUTH TWICE DAILY WITH MEALS 180 tablet 1   montelukast (SINGULAIR) 10 MG tablet Take 1 tablet (10 mg total) by mouth at bedtime. 90 tablet 3   Multiple Vitamins-Minerals (MULTIVITAMIN WITH MINERALS) tablet Take 1 tablet by mouth daily.     nitroGLYCERIN (NITROSTAT) 0.4 MG SL tablet Place 1 tablet (0.4 mg total) under the tongue every 5 (five) minutes as needed for chest pain. (Patient not taking: Reported on 07/02/2021) 25 tablet 0   potassium chloride SA (KLOR-CON M) 20 MEQ tablet TAKE 1 TABLET(20 MEQ) BY MOUTH DAILY 30 tablet 3   tretinoin (RETIN-A) 0.05 % cream tretinoin 0.05 % topical cream     vitamin E 180 MG (400 UNITS) capsule Take 400 Units by mouth daily.     No current facility-administered medications on file prior to visit.   Past Medical History:  Diagnosis Date   Anemia    Arthritis    Bronchiectasis ([place])    CAP (community acquired pneumonia)  vs Eosinophilic Pna 12/24/5463   Followed in Pulmonary clinic/ [facility]/ [place]    Cardiac dysrhythmia    Chronic idiopathic constipation    Colitis, acute 02/08/2021   Depression,  major, recurrent, moderate (HCC)    Headache    Heart murmur    History of bladder infections    History of COVID-19    Hyperlipidemia    Hypertension    Hypothyroidism    Knee pain, bilateral    Melena 02/08/2021   Osteoarthritis    Pneumonia    Primary insomnia    Recovering alcoholic (HCC)    SVT (supraventricular tachycardia) (HCC)    UTI (urinary tract infection)    Varicose veins    Vitamin B12 deficiency    Past Surgical History:  Procedure Laterality Date   ABDOMINAL HYSTERECTOMY  1991   ANGIOPLASTY     at least 15 years ago, pt. denies    APPENDECTOMY     AUGMENTATION MAMMAPLASTY Bilateral    BACK SURGERY     had 2  surgeries. Have rods placed in back    BARIATRIC SURGERY     lap band-at least 14 years ago   BREAST BIOPSY Left    x2   BREAST ENHANCEMENT SURGERY  2006   CHOLECYSTECTOMY  12/2019   removed lap band.    COLONOSCOPY  12/01/2016   Moderate predominantly sigmod diverticulosis. Otherwise grossly normal colonoscopy   EYE SURGERY     IOL- bilateral - [place]   KNEE ARTHROSCOPY Left    lap band surgery  1981   LUMBAR FUSION  07/29/2015   posterior level one   removal of cervical disc fragments  10/2007   pt. denies     Family History  Problem Relation Age of Onset   Heart disease Mother    Heart disease Father    Diabetes Sister    Other Sister        BRAIN TUMOR   Heart disease Brother    Heart disease Brother    Heart disease Brother    Heart disease Brother    Heart disease Brother    Colon cancer Maternal Aunt    Colon cancer Cousin    Social History   Socioeconomic History   Marital status: Married    Spouse name: Not on file   Number of children: 2   Years of education: Not on file   Highest education level: Not on file  Occupational History   Occupation: retired    Comment: Marine scientist  Tobacco Use   Smoking status: Never   Smokeless tobacco: Never  Scientific laboratory technician Use: Never used  Substance and Sexual Activity   Alcohol use: No    Alcohol/week: 0.0 standard drinks    Comment: recovery x 20 yrs   Drug use: No   Sexual activity: Not Currently    Comment: MARRIED  Other Topics Concern   Not on file  Social History Narrative   Not on file   Social Determinants of Health   Financial Resource Strain: Not on file  Food Insecurity: No Food Insecurity   Worried About Charity fundraiser in the Last Year: Never true   [place] in the Last Year: Never true  Transportation Needs: No Transportation Needs   Lack of Transportation (Medical): No   Lack of Transportation (Non-Medical): No  Physical Activity: Not on file  Stress: Not on file  Social  Connections: Not on file    Review of Systems  Constitutional:  Negative for chills, fatigue and fever.  HENT:  Negative for congestion, rhinorrhea and sore throat.   Respiratory:  Negative for cough and shortness of breath.  Cardiovascular:  Negative for chest pain.  Gastrointestinal:  Negative for abdominal pain, constipation, diarrhea, nausea and vomiting.  Genitourinary:  Negative for dysuria and urgency.  Musculoskeletal:  Positive for arthralgias (right shoulder and upper arm). Negative for back pain and myalgias.  Neurological:  Negative for dizziness, weakness, light-headedness and headaches.  Psychiatric/Behavioral:  Negative for dysphoric mood. The patient is not nervous/anxious.     Objective:  BP 126/60    Pulse 78    Temp (!) 96.7 F (35.9 C)    Resp 16    Ht 5\' 5"  (1.651 m)    Wt 180 lb (81.6 kg)    BMI 29.95 kg/m   BP/Weight 07/19/2021 1/88/4166 0/11/3014  Systolic BP 010 932 355  Diastolic BP 60 62 70  Wt. (Lbs) 180 178 179  BMI 29.95 29.62 28.89    Physical Exam Vitals reviewed.  Constitutional:      Appearance: Normal appearance. She is normal weight.  Neck:     Vascular: No carotid bruit.  Cardiovascular:     Rate and Rhythm: Normal rate and regular rhythm.     Heart sounds: Normal heart sounds.  Pulmonary:     Effort: Pulmonary effort is normal. No respiratory distress.     Breath sounds: Normal breath sounds.  Abdominal:     General: Abdomen is flat. Bowel sounds are normal.     Palpations: Abdomen is soft.     Tenderness: There is no abdominal tenderness.  Neurological:     Mental Status: She is alert and oriented to person, place, and time.  Psychiatric:        Behavior: Behavior normal.     Comments: Depressed. tearful    Diabetic Foot Exam - Simple   No data filed      Lab Results  Component Value Date   WBC 7.1 04/13/2021   HGB 12.4 04/13/2021   HCT 37.6 04/13/2021   PLT 223 04/13/2021   GLUCOSE 102 (H) 04/13/2021   CHOL 267 (H)  04/13/2021   TRIG 164 (H) 04/13/2021   HDL 46 04/13/2021   LDLCALC 190 (H) 04/13/2021   ALT 24 04/13/2021   AST 21 04/13/2021   NA 137 04/13/2021   K 5.0 04/13/2021   CL 98 04/13/2021   CREATININE 1.13 (H) 04/13/2021   BUN 20 04/13/2021   CO2 23 04/13/2021   TSH 1.630 02/08/2021   HGBA1C 5.9 (H) 04/13/2021      Assessment & Plan:   Problem List Items Addressed This Visit       Cardiovascular and Mediastinum   Supraventricular tachycardia ([place])    Rate controlled on metoprolol.       Relevant Orders   Comprehensive metabolic panel   CBC with Differential/Platelet   Coronary artery disease involving native coronary artery of native heart without angina pectoris    The current medical regimen is effective;  continue present plan and medications. Continue Aspirin 81 mg daily, Metoprolol tartrate 50 mg twice daily and on lisinopril 5 mg daily. Lasix 20 mg once daily. POTASSIUM CHLORIDE 20 meq once daily. Intolerant to statins and repatha, Recommend continue to work on eating healthy diet and exercise. Management per specialist. [facility]        Peripheral vascular disease ([place])     Respiratory   Bronchiectasis ([place])    The current medical regimen is effective;  continue present plan and medications.         Endocrine   Secondary hypothyroidism    Check  tsh.       Relevant Orders   TSH     Other   Mixed hyperlipidemia    Intolerant to statins and repatha.  Patient is taking otc K2 (menaquinone-7) Continue to work on eating a healthy diet and exercise.  Labs drawn today.        Relevant Orders   Lipid panel   Myalgia due to statin    Intolerant to statin.      Prediabetes - Primary    Start on metformin 500 mg once daily in am.      Relevant Orders   Comprehensive metabolic panel   Hemoglobin A1c   Moderate recurrent major depression ([place])    Start on wellbutrin xl 150 mg once daily in am x 1 week, then increase to 300 mg daily in am.   Consider counseling.       Relevant Medications   buPROPion (WELLBUTRIN XL) 150 MG 24 hr tablet  .  Meds ordered this encounter  Medications   metFORMIN (GLUCOPHAGE) 500 MG tablet    Sig: Take 1 tablet (500 mg total) by mouth daily with breakfast.    Dispense:  90 tablet    Refill:  0   buPROPion (WELLBUTRIN XL) 150 MG 24 hr tablet    Sig: Start on wellbutrin xl 150 mg once in am x 1 week, then increase to 300 mg daily in am.    Dispense:  180 tablet    Refill:  0    Orders Placed This Encounter  Procedures   Comprehensive metabolic panel   Hemoglobin A1c   Lipid panel   CBC with Differential/Platelet   TSH     Follow-up: Return in about 3 months (around 10/17/2021) for chronic fasting.  An After Visit Summary was printed and given to the patient.  [name], MD [name] [phone]

## 2021-07-19 ENCOUNTER — Other Ambulatory Visit: Payer: Self-pay

## 2021-07-19 ENCOUNTER — Ambulatory Visit (INDEPENDENT_AMBULATORY_CARE_PROVIDER_SITE_OTHER): Payer: Medicare Other | Admitting: Family Medicine

## 2021-07-19 ENCOUNTER — Encounter: Payer: Self-pay | Admitting: Family Medicine

## 2021-07-19 VITALS — BP 126/60 | HR 78 | Temp 96.7°F | Resp 16 | Ht 65.0 in | Wt 180.0 lb

## 2021-07-19 DIAGNOSIS — J479 Bronchiectasis, uncomplicated: Secondary | ICD-10-CM | POA: Diagnosis not present

## 2021-07-19 DIAGNOSIS — R7303 Prediabetes: Secondary | ICD-10-CM

## 2021-07-19 DIAGNOSIS — I2510 Atherosclerotic heart disease of native coronary artery without angina pectoris: Secondary | ICD-10-CM | POA: Diagnosis not present

## 2021-07-19 DIAGNOSIS — K5904 Chronic idiopathic constipation: Secondary | ICD-10-CM

## 2021-07-19 DIAGNOSIS — I739 Peripheral vascular disease, unspecified: Secondary | ICD-10-CM

## 2021-07-19 DIAGNOSIS — I471 Supraventricular tachycardia: Secondary | ICD-10-CM

## 2021-07-19 DIAGNOSIS — M791 Myalgia, unspecified site: Secondary | ICD-10-CM

## 2021-07-19 DIAGNOSIS — T466X5A Adverse effect of antihyperlipidemic and antiarteriosclerotic drugs, initial encounter: Secondary | ICD-10-CM | POA: Diagnosis not present

## 2021-07-19 DIAGNOSIS — F331 Major depressive disorder, recurrent, moderate: Secondary | ICD-10-CM | POA: Diagnosis not present

## 2021-07-19 DIAGNOSIS — E038 Other specified hypothyroidism: Secondary | ICD-10-CM

## 2021-07-19 DIAGNOSIS — E782 Mixed hyperlipidemia: Secondary | ICD-10-CM | POA: Diagnosis not present

## 2021-07-19 DIAGNOSIS — F330 Major depressive disorder, recurrent, mild: Secondary | ICD-10-CM | POA: Insufficient documentation

## 2021-07-19 MED ORDER — BUPROPION HCL ER (XL) 150 MG PO TB24
ORAL_TABLET | ORAL | 0 refills | Status: DC
Start: 2021-07-19 — End: 2021-09-30

## 2021-07-19 MED ORDER — METFORMIN HCL 500 MG PO TABS: 500.0000 mg | ORAL_TABLET | Freq: Every day | ORAL | 0 refills | Status: DC

## 2021-07-19 NOTE — Assessment & Plan Note (Signed)
Intolerant to statin.  ?

## 2021-07-19 NOTE — Patient Instructions (Addendum)
Start wellbutrin xl 150 mg once in am x 1 week, then increase to 150 mg 2 in am.  Call back in 4-6 weeks if not helping.  Consider counseling. [place] or [facility] are good and in town.  Start metformin 500 mg daily.  Recommend continue to work on eating healthy diet and exercise.

## 2021-07-19 NOTE — Assessment & Plan Note (Addendum)
Intolerant to statins and repatha.  Patient is taking otc K2 (menaquinone-7) Continue to work on eating a healthy diet and exercise.  Labs drawn today.

## 2021-07-19 NOTE — Assessment & Plan Note (Signed)
Start on metformin 500 mg once daily in am.

## 2021-07-19 NOTE — Assessment & Plan Note (Signed)
Start on wellbutrin xl 150 mg once daily in am x 1 week, then increase to 300 mg daily in am.  Consider counseling.

## 2021-07-19 NOTE — Assessment & Plan Note (Signed)
Rate controlled on metoprolol.

## 2021-07-19 NOTE — Assessment & Plan Note (Signed)
Check tsh 

## 2021-07-19 NOTE — Assessment & Plan Note (Signed)
The current medical regimen is effective;  continue present plan and medications.  

## 2021-07-19 NOTE — Assessment & Plan Note (Addendum)
The current medical regimen is effective;  continue present plan and medications. Continue Aspirin 81 mg daily, Metoprolol tartrate 50 mg twice daily and on lisinopril 5 mg daily. Lasix 20 mg once daily. POTASSIUM CHLORIDE 20 meq once daily. Intolerant to statins and repatha, Recommend continue to work on eating healthy diet and exercise. Management per specialist. [facility]

## 2021-07-20 LAB — CBC WITH DIFFERENTIAL/PLATELET
Basophils Absolute: 0.1 10*3/uL (ref 0.0–0.2)
Basos: 1 %
EOS (ABSOLUTE): 3.1 10*3/uL — ABNORMAL HIGH (ref 0.0–0.4)
Eos: 35 %
Hematocrit: 35.2 % (ref 34.0–46.6)
Hemoglobin: 11.8 g/dL (ref 11.1–15.9)
Immature Grans (Abs): 0 10*3/uL (ref 0.0–0.1)
Immature Granulocytes: 0 %
Lymphocytes Absolute: 1.6 10*3/uL (ref 0.7–3.1)
Lymphs: 19 %
MCH: 30.6 pg (ref 26.6–33.0)
MCHC: 33.5 g/dL (ref 31.5–35.7)
MCV: 91 fL (ref 79–97)
Monocytes Absolute: 0.7 10*3/uL (ref 0.1–0.9)
Monocytes: 8 %
Neutrophils Absolute: 3.3 10*3/uL (ref 1.4–7.0)
Neutrophils: 37 %
Platelets: 226 10*3/uL (ref 150–450)
RBC: 3.86 x10E6/uL (ref 3.77–5.28)
RDW: 12.7 % (ref 11.7–15.4)
WBC: 8.7 10*3/uL (ref 3.4–10.8)

## 2021-07-20 LAB — TSH: TSH: 3.16 u[IU]/mL (ref 0.450–4.500)

## 2021-07-20 LAB — COMPREHENSIVE METABOLIC PANEL
ALT: 12 IU/L (ref 0–32)
AST: 15 IU/L (ref 0–40)
Albumin/Globulin Ratio: 1.8 (ref 1.2–2.2)
Albumin: 4.3 g/dL (ref 3.7–4.7)
Alkaline Phosphatase: 118 IU/L (ref 44–121)
BUN/Creatinine Ratio: 36 — ABNORMAL HIGH (ref 12–28)
BUN: 46 mg/dL — ABNORMAL HIGH (ref 8–27)
Bilirubin Total: 0.3 mg/dL (ref 0.0–1.2)
CO2: 23 mmol/L (ref 20–29)
Calcium: 9.6 mg/dL (ref 8.7–10.3)
Chloride: 102 mmol/L (ref 96–106)
Creatinine, Ser: 1.28 mg/dL — ABNORMAL HIGH (ref 0.57–1.00)
Globulin, Total: 2.4 g/dL (ref 1.5–4.5)
Glucose: 91 mg/dL (ref 70–99)
Potassium: 5.8 mmol/L — ABNORMAL HIGH (ref 3.5–5.2)
Sodium: 138 mmol/L (ref 134–144)
Total Protein: 6.7 g/dL (ref 6.0–8.5)
eGFR: 44 mL/min/{1.73_m2} — ABNORMAL LOW (ref >59)

## 2021-07-20 LAB — CARDIOVASCULAR RISK ASSESSMENT

## 2021-07-20 LAB — LIPID PANEL
Chol/HDL Ratio: 4.7 ratio — ABNORMAL HIGH (ref 0.0–4.4)
Cholesterol, Total: 268 mg/dL — ABNORMAL HIGH (ref 100–199)
HDL: 57 mg/dL (ref >39)
LDL Chol Calc (NIH): 192 mg/dL — ABNORMAL HIGH (ref 0–99)
Triglycerides: 110 mg/dL (ref 0–149)
VLDL Cholesterol Cal: 19 mg/dL (ref 5–40)

## 2021-07-20 LAB — HEMOGLOBIN A1C
Est. average glucose Bld gHb Est-mCnc: 134 mg/dL
Hgb A1c MFr Bld: 6.3 % — ABNORMAL HIGH (ref 4.8–5.6)

## 2021-07-20 NOTE — Progress Notes (Signed)
Blood count normal.  Liver function normal.  Kidney function abnormal. Worsened. Recommend push fluids. Potassium up. Recommend return to recheck potassium today.  Thyroid function normal.  Cholesterol: very poor.  HBA1C: worsened to 6.3. Patient may return in 6 weeks on weight and depression. I may not have anything available, but she may see Larene Beach or sally

## 2021-07-21 ENCOUNTER — Ambulatory Visit: Payer: Medicare Other | Admitting: Physician Assistant

## 2021-07-21 ENCOUNTER — Ambulatory Visit (INDEPENDENT_AMBULATORY_CARE_PROVIDER_SITE_OTHER): Payer: Medicare Other | Admitting: Nurse Practitioner

## 2021-07-21 ENCOUNTER — Encounter: Payer: Self-pay | Admitting: Nurse Practitioner

## 2021-07-21 ENCOUNTER — Other Ambulatory Visit: Payer: Self-pay

## 2021-07-21 ENCOUNTER — Other Ambulatory Visit: Payer: Medicare Other

## 2021-07-21 VITALS — BP 142/78 | HR 76 | Temp 97.6°F | Ht 65.0 in | Wt 178.6 lb

## 2021-07-21 DIAGNOSIS — M19011 Primary osteoarthritis, right shoulder: Secondary | ICD-10-CM

## 2021-07-21 DIAGNOSIS — R7303 Prediabetes: Secondary | ICD-10-CM | POA: Diagnosis not present

## 2021-07-21 DIAGNOSIS — E7800 Pure hypercholesterolemia, unspecified: Secondary | ICD-10-CM | POA: Diagnosis not present

## 2021-07-21 DIAGNOSIS — G8929 Other chronic pain: Secondary | ICD-10-CM | POA: Diagnosis not present

## 2021-07-21 DIAGNOSIS — Z6829 Body mass index (BMI) 29.0-29.9, adult: Secondary | ICD-10-CM | POA: Diagnosis not present

## 2021-07-21 DIAGNOSIS — M25511 Pain in right shoulder: Secondary | ICD-10-CM

## 2021-07-21 DIAGNOSIS — I2510 Atherosclerotic heart disease of native coronary artery without angina pectoris: Secondary | ICD-10-CM | POA: Diagnosis not present

## 2021-07-21 DIAGNOSIS — Z789 Other specified health status: Secondary | ICD-10-CM | POA: Diagnosis not present

## 2021-07-21 DIAGNOSIS — I10 Essential (primary) hypertension: Secondary | ICD-10-CM | POA: Diagnosis not present

## 2021-07-21 DIAGNOSIS — E875 Hyperkalemia: Secondary | ICD-10-CM

## 2021-07-21 LAB — COMPREHENSIVE METABOLIC PANEL
ALT: 13 IU/L (ref 0–32)
AST: 12 IU/L (ref 0–40)
Albumin/Globulin Ratio: 2 (ref 1.2–2.2)
Albumin: 4.7 g/dL (ref 3.7–4.7)
Alkaline Phosphatase: 88 IU/L (ref 44–121)
BUN/Creatinine Ratio: 26 (ref 12–28)
BUN: 41 mg/dL — ABNORMAL HIGH (ref 8–27)
Bilirubin Total: 0.3 mg/dL (ref 0.0–1.2)
CO2: 28 mmol/L (ref 20–29)
Calcium: 9.7 mg/dL (ref 8.7–10.3)
Chloride: 101 mmol/L (ref 96–106)
Creatinine, Ser: 1.55 mg/dL — ABNORMAL HIGH (ref 0.57–1.00)
Globulin, Total: 2.3 g/dL (ref 1.5–4.5)
Glucose: 90 mg/dL (ref 70–99)
Potassium: 4.9 mmol/L (ref 3.5–5.2)
Sodium: 141 mmol/L (ref 134–144)
Total Protein: 7 g/dL (ref 6.0–8.5)
eGFR: 35 mL/min/{1.73_m2} — ABNORMAL LOW (ref >59)

## 2021-07-21 MED ORDER — TRIAMCINOLONE ACETONIDE 40 MG/ML IJ SUSP: 40.0000 mg | Freq: Once | INTRAMUSCULAR | Status: DC

## 2021-07-21 NOTE — Progress Notes (Signed)
Acute Office Visit  Subjective:    Patient ID: Courtney [name], female    DOB: 1945/06/25, 76 y.o.   MRN: [id number]  Chief Complaint  Patient presents with   Right shoulder pain    HPI Patient is in today for right shoulder pain, has OA. Requested steroid injection.  Past Medical History:  Diagnosis Date   Anemia    Arthritis    Bronchiectasis ([place])    CAP (community acquired pneumonia)  vs Eosinophilic Pna 02/19/3299   Followed in Pulmonary clinic/ [facility]/ [place]    Cardiac dysrhythmia    Chronic idiopathic constipation    Colitis, acute 02/08/2021   Depression, major, recurrent, moderate (HCC)    Headache    Heart murmur    History of bladder infections    History of COVID-19    Hyperlipidemia    Hypertension    Hypothyroidism    Knee pain, bilateral    Melena 02/08/2021   Osteoarthritis    Pneumonia    Primary insomnia    Recovering alcoholic (HCC)    SVT (supraventricular tachycardia) (HCC)    UTI (urinary tract infection)    Varicose veins    Vitamin B12 deficiency     Past Surgical History:  Procedure Laterality Date   ABDOMINAL HYSTERECTOMY  1991   ANGIOPLASTY     at least 15 years ago, pt. denies    APPENDECTOMY     AUGMENTATION MAMMAPLASTY Bilateral    BACK SURGERY     had 2 surgeries. Have rods placed in back    BARIATRIC SURGERY     lap band-at least 14 years ago   BREAST BIOPSY Left    x2   BREAST ENHANCEMENT SURGERY  2006   CHOLECYSTECTOMY  12/2019   removed lap band.    COLONOSCOPY  12/01/2016   Moderate predominantly sigmod diverticulosis. Otherwise grossly normal colonoscopy   EYE SURGERY     IOL- bilateral - [place]   KNEE ARTHROSCOPY Left    lap band surgery  1981   LUMBAR FUSION  07/29/2015   posterior level one   removal of cervical disc fragments  10/2007   pt. denies     Family History  Problem Relation Age of Onset   Heart disease Mother    Heart disease Father    Diabetes Sister    Other Sister         BRAIN TUMOR   Heart disease Brother    Heart disease Brother    Heart disease Brother    Heart disease Brother    Heart disease Brother    Colon cancer Maternal Aunt    Colon cancer Cousin     Social History   Socioeconomic History   Marital status: Married    Spouse name: Not on file   Number of children: 2   Years of education: Not on file   Highest education level: Not on file  Occupational History   Occupation: retired    Comment: Marine scientist  Tobacco Use   Smoking status: Never   Smokeless tobacco: Never  Scientific laboratory technician Use: Never used  Substance and Sexual Activity   Alcohol use: No    Alcohol/week: 0.0 standard drinks    Comment: recovery x 20 yrs   Drug use: No   Sexual activity: Not Currently    Comment: MARRIED  Other Topics Concern   Not on file  Social History Narrative   Not on file   Social Determinants of  Health   Financial Resource Strain: Not on file  Food Insecurity: No Food Insecurity   Worried About [place] in the Last Year: Never true   Ran Out of Food in the Last Year: Never true  Transportation Needs: No Transportation Needs   Lack of Transportation (Medical): No   Lack of Transportation (Non-Medical): No  Physical Activity: Not on file  Stress: Not on file  Social Connections: Not on file  Intimate Partner Violence: Not on file    Outpatient Medications Prior to Visit  Medication Sig Dispense Refill   albuterol (VENTOLIN HFA) 108 (90 Base) MCG/ACT inhaler Inhale into the lungs.     aspirin 81 MG chewable tablet Chew 81 mg by mouth daily.     budesonide-formoterol (SYMBICORT) 160-4.5 MCG/ACT inhaler Inhale 2 puffs into the lungs in the morning and at bedtime. 1 each 6   buPROPion (WELLBUTRIN XL) 150 MG 24 hr tablet Start on wellbutrin xl 150 mg once in am x 1 week, then increase to 300 mg daily in am. 180 tablet 0   cetirizine (ZYRTEC) 10 MG tablet Take 1 tablet (10 mg total) by mouth daily. 90 tablet 3   Cholecalciferol  (D3-50 PO) Take by mouth.     furosemide (LASIX) 20 MG tablet TAKE 1 TABLET(20 MG) BY MOUTH DAILY 90 tablet 1   gabapentin (NEURONTIN) 600 MG tablet Take 1 tablet (600 mg total) by mouth at bedtime. As needed 90 tablet 1   ipratropium (ATROVENT) 0.02 % nebulizer solution Take 2.5 mLs (0.5 mg total) by nebulization 4 (four) times daily. 100 mL 12   levothyroxine (SYNTHROID) 88 MCG tablet TAKE 1 TABLET(88 MCG) BY MOUTH DAILY BEFORE AND BREAKFAST 90 tablet 2   lisinopril (ZESTRIL) 5 MG tablet Take 5 mg by mouth daily.     Menaquinone-7 (K2 PO) Take by mouth.     metFORMIN (GLUCOPHAGE) 500 MG tablet Take 1 tablet (500 mg total) by mouth daily with breakfast. 90 tablet 0   metoprolol tartrate (LOPRESSOR) 50 MG tablet TAKE 1 TABLET(50 MG) BY MOUTH TWICE DAILY WITH MEALS 180 tablet 1   montelukast (SINGULAIR) 10 MG tablet Take 1 tablet (10 mg total) by mouth at bedtime. 90 tablet 3   Multiple Vitamins-Minerals (MULTIVITAMIN WITH MINERALS) tablet Take 1 tablet by mouth daily.     nitroGLYCERIN (NITROSTAT) 0.4 MG SL tablet Place 1 tablet (0.4 mg total) under the tongue every 5 (five) minutes as needed for chest pain. 25 tablet 0   oxyCODONE-acetaminophen (PERCOCET) 10-325 MG tablet Take by mouth.     potassium chloride SA (KLOR-CON M) 20 MEQ tablet TAKE 1 TABLET(20 MEQ) BY MOUTH DAILY 30 tablet 3   rosuvastatin (CRESTOR) 10 MG tablet Take by mouth.     tretinoin (RETIN-A) 0.05 % cream tretinoin 0.05 % topical cream     vitamin E 180 MG (400 UNITS) capsule Take 400 Units by mouth daily.     No facility-administered medications prior to visit.    Allergies  Allergen Reactions   Levofloxacin Hives, Shortness Of Breath and Swelling    RASH IN MOUTH   (can take IV route)   Bactrim [Sulfamethoxazole-Trimethoprim] Hives and Itching   Clarithromycin     GI upset Other reaction(s): GI Upset (intolerance) GI upset -can take but does cause mild upset    Lyrica [Pregabalin]    Macrobid [Nitrofurantoin]      Not tolerate   Repatha [Evolocumab]     Legs hurt.    Statins  Other (See Comments)    Myalgias    Review of Systems  Constitutional:  Negative for appetite change, fatigue and fever.  HENT:  Negative for congestion, ear pain, sinus pressure and sore throat.   Eyes:  Negative for pain.  Respiratory:  Negative for cough, chest tightness, shortness of breath and wheezing.   Cardiovascular:  Negative for chest pain and palpitations.  Gastrointestinal:  Negative for abdominal pain, constipation, diarrhea, nausea and vomiting.  Genitourinary:  Negative for dysuria and hematuria.  Musculoskeletal:  Positive for myalgias (Right shoulder pain). Negative for arthralgias, back pain and joint swelling.  Skin:  Negative for rash.  Neurological:  Negative for dizziness, weakness and headaches.  Psychiatric/Behavioral:  Negative for dysphoric mood. The patient is not nervous/anxious.       Objective:    Physical Exam Vitals reviewed.  Constitutional:      Appearance: Normal appearance.  Pulmonary:     Effort: Pulmonary effort is normal.  Musculoskeletal:        General: Tenderness present.  Skin:    General: Skin is warm and dry.     Capillary Refill: Capillary refill takes less than 2 seconds.  Neurological:     General: No focal deficit present.     Mental Status: She is alert and oriented to person, place, and time.  Psychiatric:        Mood and Affect: Mood normal.        Behavior: Behavior normal.    BP (!) 142/78 (BP Location: Left Arm, Patient Position: Sitting)    Pulse 76    Temp 97.6 F (36.4 C) (Temporal)    Ht '5\' 5"'  (1.651 m)    Wt 178 lb 9.6 oz (81 kg)    SpO2 97%    BMI 29.72 kg/m  Wt Readings from Last 3 Encounters:  07/21/21 178 lb 9.6 oz (81 kg)  07/19/21 180 lb (81.6 kg)  07/02/21 178 lb (80.7 kg)    Health Maintenance Due  Topic Date Due   TETANUS/TDAP  Never done   Zoster Vaccines- Shingrix (1 of 2) Never done   COVID-19 Vaccine (3 - Mixed Product risk  series) 09/09/2019       Lab Results  Component Value Date   TSH 3.160 07/19/2021   Lab Results  Component Value Date   WBC 8.7 07/19/2021   HGB 11.8 07/19/2021   HCT 35.2 07/19/2021   MCV 91 07/19/2021   PLT 226 07/19/2021   Lab Results  Component Value Date   NA 138 07/19/2021   K 5.8 (H) 07/19/2021   CO2 23 07/19/2021   GLUCOSE 91 07/19/2021   BUN 46 (H) 07/19/2021   CREATININE 1.28 (H) 07/19/2021   BILITOT 0.3 07/19/2021   ALKPHOS 118 07/19/2021   AST 15 07/19/2021   ALT 12 07/19/2021   PROT 6.7 07/19/2021   ALBUMIN 4.3 07/19/2021   CALCIUM 9.6 07/19/2021   ANIONGAP 9 09/04/2018   EGFR 44 (L) 07/19/2021   Lab Results  Component Value Date   CHOL 268 (H) 07/19/2021   Lab Results  Component Value Date   HDL 57 07/19/2021   Lab Results  Component Value Date   LDLCALC 192 (H) 07/19/2021   Lab Results  Component Value Date   TRIG 110 07/19/2021   Lab Results  Component Value Date   CHOLHDL 4.7 (H) 07/19/2021   Lab Results  Component Value Date   HGBA1C 6.3 (H) 07/19/2021  Assessment & Plan:   1. Primary osteoarthritis of right shoulder - triamcinolone acetonide (KENALOG-40) injection 40 mg  2. Chronic right shoulder pain - triamcinolone acetonide (KENALOG-40) injection 40 mg    Risks were discussed including bleeding, infection, increase in sugars if diabetic, atrophy at site of injection, and increased pain.  After consent was obtained, using sterile technique the  shoulder was prepped with alcohol.  The joint was entered posteriorly and  Kenalog 40 mg and 5 ml plain Lidocaine was then injected and the needle withdrawn.  The procedure was well tolerated.   The patient is asked to continue to rest the joint for a few more days before resuming regular activities.  It may be more painful for the first 1-2 days.  Watch for fever, or increased swelling or persistent pain in the joint. Call or return to clinic prn if such symptoms occur or  there is failure to improve as anticipated.     I,Lauren M Auman,acting as a Education administrator for [employer], NP.,have documented all relevant documentation on the behalf of Rip Harbour, NP,as directed by  Rip Harbour, NP while in the presence of Rip Harbour, NP.   Oleta Mouse, CMA  I, Rip Harbour, NP, have reviewed all documentation for this visit. The documentation on 07/21/21 for the exam, diagnosis, procedures, and orders are all accurate and complete.

## 2021-07-22 ENCOUNTER — Telehealth: Payer: Self-pay

## 2021-07-22 NOTE — Chronic Care Management (AMB) (Signed)
Chronic Care Management Pharmacy Assistant   Name: Courtney [name]  MRN: [id number] DOB: Jul 13, 1945   Reason for Encounter: General Adherence Call    Recent office visits:  07/21/21 Jerrell Belfast NP. Seen for Shoulder Pain. Injected Kenalog 40mg  intra muscular in office.  07/19/21 [name] MD. Seen for Prediabetes, HLD and Hypothyroidism. Started Bupropion HCI 150mg  once in am x 1 week, then increase to 300 mg daily in am. Started Metformin HCI 500 mg daily.   06/25/21 [name] MD. Seen for Bronchiectasis. Started on Augmentin 875-125 mg and Ipratropium Bromide 0.5 mg nebulization.   06/07/21 [name] MD. Seen for Diverticulitis. Started on Ciprofloxacin HCI 500 mg and Lomotil 2.5-0.025 mg.   Recent consult visits:  07/02/21 (Pulmonologist) Geraldo Pitter NP. Seen for flu-like symptoms. Started on Ciprofloxacin HCI 500 mg. Received a Depo Medrol injection 80mg  in office. Started on Prednisone 10mg .   06/04/21 (Pulmonologist) [name] MD. Telephone Encounter. Started on Azithromycin 250mg  and Prednisone 40mg .   05/28/21 (Pain Management) Vicenta Aly. Telemedicine Visit. Seen for failed back syndrome. Started on Tizanidine 4mg , Oxycodone-Acet 10-325mg .  Hospital visits:  None  Medications: Outpatient Encounter Medications as of 07/22/2021  Medication Sig   albuterol (VENTOLIN HFA) 108 (90 Base) MCG/ACT inhaler Inhale into the lungs.   aspirin 81 MG chewable tablet Chew 81 mg by mouth daily.   budesonide-formoterol (SYMBICORT) 160-4.5 MCG/ACT inhaler Inhale 2 puffs into the lungs in the morning and at bedtime.   buPROPion (WELLBUTRIN XL) 150 MG 24 hr tablet Start on wellbutrin xl 150 mg once in am x 1 week, then increase to 300 mg daily in am.   cetirizine (ZYRTEC) 10 MG tablet Take 1 tablet (10 mg total) by mouth daily.   Cholecalciferol (D3-50 PO) Take by mouth.   furosemide (LASIX) 20 MG tablet TAKE 1 TABLET(20 MG) BY MOUTH DAILY   gabapentin  (NEURONTIN) 600 MG tablet Take 1 tablet (600 mg total) by mouth at bedtime. As needed   ipratropium (ATROVENT) 0.02 % nebulizer solution Take 2.5 mLs (0.5 mg total) by nebulization 4 (four) times daily.   levothyroxine (SYNTHROID) 88 MCG tablet TAKE 1 TABLET(88 MCG) BY MOUTH DAILY BEFORE AND BREAKFAST   lisinopril (ZESTRIL) 5 MG tablet Take 5 mg by mouth daily.   Menaquinone-7 (K2 PO) Take by mouth.   metFORMIN (GLUCOPHAGE) 500 MG tablet Take 1 tablet (500 mg total) by mouth daily with breakfast.   metoprolol tartrate (LOPRESSOR) 50 MG tablet TAKE 1 TABLET(50 MG) BY MOUTH TWICE DAILY WITH MEALS   montelukast (SINGULAIR) 10 MG tablet Take 1 tablet (10 mg total) by mouth at bedtime.   Multiple Vitamins-Minerals (MULTIVITAMIN WITH MINERALS) tablet Take 1 tablet by mouth daily.   nitroGLYCERIN (NITROSTAT) 0.4 MG SL tablet Place 1 tablet (0.4 mg total) under the tongue every 5 (five) minutes as needed for chest pain.   oxyCODONE-acetaminophen (PERCOCET) 10-325 MG tablet Take by mouth.   potassium chloride SA (KLOR-CON M) 20 MEQ tablet TAKE 1 TABLET(20 MEQ) BY MOUTH DAILY   rosuvastatin (CRESTOR) 10 MG tablet Take by mouth.   tretinoin (RETIN-A) 0.05 % cream tretinoin 0.05 % topical cream   vitamin E 180 MG (400 UNITS) capsule Take 400 Units by mouth daily.   Facility-Administered Encounter Medications as of 07/22/2021  Medication   triamcinolone acetonide (KENALOG-40) injection 40 mg   Contacted [facility] for general disease state and medication adherence call.   Patient is > 5 days past due for  refill on the following medications per chart history:  [facility] Medications: Medication Name/mg Last Fill Days Supply Rosuvastatin 10mg   04/12/21 30ds     03/13/21 30ds Metformin 500mg   07/19/21 90ds Lisinopril 5mg    06/04/21 90ds     03/08/21 90ds   What concerns do you have about your medications? Pt denies any concerns  The patient denies side effects with her medications.   How often do you  forget or accidentally miss a dose? Never  Are you having any problems getting your medications from your pharmacy? No  Has the cost of your medications been a concern? No  Since last visit with CPP, no interventions have been made:   The patient has not had an ED visit since last contact.   The patient denies problems with their health.   she denies  concerns or questions for [place], at this time.   Patient stated her and her husband are doing well and they have no concerns at the moment   Care Gaps: Last annual wellness visit? None noted  Colonscopy: 12/01/16 Dexa Scan: 11/25/32 If applicable:N/A Last eye exam / retinopathy screening? Diabetic foot exam?  [name], [place] Pharmacist Assistant  [phone]

## 2021-07-23 ENCOUNTER — Other Ambulatory Visit: Payer: Self-pay

## 2021-07-23 DIAGNOSIS — N1832 Chronic kidney disease, stage 3b: Secondary | ICD-10-CM

## 2021-07-23 DIAGNOSIS — N289 Disorder of kidney and ureter, unspecified: Secondary | ICD-10-CM

## 2021-07-26 ENCOUNTER — Telehealth: Payer: Self-pay

## 2021-07-26 ENCOUNTER — Other Ambulatory Visit: Payer: Self-pay | Admitting: Family Medicine

## 2021-07-26 DIAGNOSIS — N1832 Chronic kidney disease, stage 3b: Secondary | ICD-10-CM

## 2021-07-26 NOTE — Telephone Encounter (Signed)
° °  Courtney [name] has been scheduled for the following appointment:  WHAT: RENAL ULTRASOUND WHERE: [facility] DATE: 07/28/21 TIME: 10:30 AM ARRIVAL TIME  Pt stated she could not do that day so she was given the # to [facility] to reschedule, pt also stated she wanted to go ahead and be referred to [facility].

## 2021-07-26 NOTE — Telephone Encounter (Signed)
Patient aware.   Courtney [name], [place] 07/26/21 1:44 PM

## 2021-07-26 NOTE — Telephone Encounter (Signed)
Patient concerned as kidney function is 35. She feels this is extremely low and needs to be referred to [facility] immediately.   Made her aware we are scheduling her for a Renal US. She VU but again stated the above. Stating she wants this done today, "this is not something to wait on."   Kayln Garceau G, CCMA 07/26/21 10:32 AM

## 2021-07-27 DIAGNOSIS — N1832 Chronic kidney disease, stage 3b: Secondary | ICD-10-CM | POA: Diagnosis not present

## 2021-07-27 DIAGNOSIS — N281 Cyst of kidney, acquired: Secondary | ICD-10-CM | POA: Diagnosis not present

## 2021-07-29 ENCOUNTER — Encounter: Payer: Self-pay | Admitting: Family Medicine

## 2021-08-02 ENCOUNTER — Encounter: Payer: Self-pay | Admitting: Nurse Practitioner

## 2021-08-02 ENCOUNTER — Other Ambulatory Visit: Payer: Self-pay

## 2021-08-02 ENCOUNTER — Ambulatory Visit (INDEPENDENT_AMBULATORY_CARE_PROVIDER_SITE_OTHER): Payer: Medicare Other | Admitting: Nurse Practitioner

## 2021-08-02 VITALS — BP 124/76 | HR 70 | Temp 96.8°F | Ht 65.0 in | Wt 172.0 lb

## 2021-08-02 DIAGNOSIS — N1832 Chronic kidney disease, stage 3b: Secondary | ICD-10-CM | POA: Diagnosis not present

## 2021-08-02 DIAGNOSIS — M79604 Pain in right leg: Secondary | ICD-10-CM | POA: Diagnosis not present

## 2021-08-02 DIAGNOSIS — M79605 Pain in left leg: Secondary | ICD-10-CM

## 2021-08-02 LAB — COMPREHENSIVE METABOLIC PANEL
ALT: 18 IU/L (ref 0–32)
AST: 19 IU/L (ref 0–40)
Albumin/Globulin Ratio: 1.7 (ref 1.2–2.2)
Albumin: 4.7 g/dL (ref 3.7–4.7)
Alkaline Phosphatase: 27 IU/L — ABNORMAL LOW (ref 44–121)
BUN/Creatinine Ratio: 18 (ref 12–28)
BUN: 29 mg/dL — ABNORMAL HIGH (ref 8–27)
Bilirubin Total: 0.2 mg/dL (ref 0.0–1.2)
CO2: 24 mmol/L (ref 20–29)
Calcium: 10.7 mg/dL — ABNORMAL HIGH (ref 8.7–10.3)
Chloride: 96 mmol/L (ref 96–106)
Creatinine, Ser: 1.6 mg/dL — ABNORMAL HIGH (ref 0.57–1.00)
Globulin, Total: 2.8 g/dL (ref 1.5–4.5)
Glucose: 88 mg/dL (ref 70–99)
Potassium: 5 mmol/L (ref 3.5–5.2)
Sodium: 137 mmol/L (ref 134–144)
Total Protein: 7.5 g/dL (ref 6.0–8.5)
eGFR: 33 mL/min/{1.73_m2} — ABNORMAL LOW (ref >59)

## 2021-08-02 LAB — POCT URINALYSIS DIP (CLINITEK)
Bilirubin, UA: NEGATIVE
Blood, UA: NEGATIVE
Glucose, UA: NEGATIVE mg/dL
Ketones, POC UA: NEGATIVE mg/dL
Leukocytes, UA: NEGATIVE
Nitrite, UA: NEGATIVE
POC PROTEIN,UA: NEGATIVE
Spec Grav, UA: 1.015 (ref 1.010–1.025)
Urobilinogen, UA: 0.2 E.U./dL
pH, UA: 6 (ref 5.0–8.0)

## 2021-08-02 NOTE — Patient Instructions (Signed)
Stop Aspirin 325 mg twice daily Avoid Ibuprofen Take Tylenol as needed for pain We will call you with lab results Follow-up as needed  Chronic Kidney Disease, Adult Chronic kidney disease (CKD) occurs when the kidneys are slowly and permanently damaged over a long period of time. The kidneys are a pair of organs that do many important jobs in the body, including: Removing waste and extra fluid from the blood to make urine. Making hormones that maintain the amount of fluid in tissues and blood vessels. Maintaining the right amount of fluids and chemicals in the body. A small amount of kidney damage may not cause problems, but a large amount of damage may make it hard or impossible for the kidneys to work right. Steps must be taken to slow kidney damage or to stop it from getting worse. If steps are not taken, the kidneys may stop working permanently (end-stage renal disease, or ESRD). Most of the time, CKD does not go away, but it can often be controlled. People who have CKD are usually able to live full lives. What are the causes? The most common causes of this condition are diabetes and high blood pressure (hypertension). Other causes include: Cardiovascular diseases. These affect the heart and blood vessels. Kidney diseases. These include: Glomerulonephritis, or inflammation of the tiny filters in the kidneys. Interstitial nephritis. This is swelling of the small tubes of the kidneys and of the surrounding structures. Polycystic kidney disease, in which clusters of fluid-filled sacs form within the kidneys. Renal vascular disease. This includes disorders that affect the arteries and veins of the kidneys. Diseases that affect the body's defense system (immune system). A problem with urine flow. This may be caused by: Kidney stones. Cancer. An enlarged prostate, in males. A kidney infection or urinary tract infection (UTI) that keeps coming back. Vasculitis. This is swelling or  inflammation of the blood vessels. What increases the risk? Your chances of having kidney disease increase with age. The following factors may make you more likely to develop this condition: A family history of kidney disease or kidney failure. Kidney failure means the kidneys can no longer work right. Certain genetic diseases. Taking medicines often that are damaging to the kidneys. Being around or being in contact with toxic substances. Obesity. A history of tobacco use. What are the signs or symptoms? Symptoms of this condition include: Feeling very tired (lethargic) and having less energy. Swelling, or edema, of the face, legs, ankles, or feet. Nausea or vomiting, or loss of appetite. Confusion or trouble concentrating. Muscle twitches and cramps, especially in the legs. Dry, itchy skin. A metallic taste in the mouth. Producing less urine, or producing more urine (especially at night). Shortness of breath. Trouble sleeping. CKD may also result in not having enough red blood cells or hemoglobin in the blood (anemia) or having weak bones (bone disease). Symptoms develop slowly and may not be obvious until the kidney damage becomes severe. It is possible to have kidney disease for years without having symptoms. How is this diagnosed? This condition may be diagnosed based on: Blood tests. Urine tests. Imaging tests, such as an ultrasound or a CT scan. A kidney biopsy. This involves removing a sample of kidney tissue to be looked at under a microscope. Results from these tests will help to determine how serious the CKD is. How is this treated? There is no cure for most cases of this condition, but treatment usually relieves symptoms and prevents or slows the worsening of the disease. Treatment  may include: Diet changes, which may require you to avoid alcohol and foods that are high in salt, potassium, phosphorous, and protein. Medicines. These may: Lower blood pressure. Control  blood sugar (glucose). Relieve anemia. Relieve swelling. Protect your bones. Improve the balance of salts and minerals in your blood (electrolytes). Dialysis, which is a type of treatment that removes toxic waste from the body. It may be needed if you have kidney failure. Managing any other conditions that are causing your CKD or making it worse. Follow these instructions at home: Medicines Take over-the-counter and prescription medicines only as told by your health care provider. The amount of some medicines that you take may need to be changed. Do not take any new medicines unless approved by your health care provider. Many medicines can make kidney damage worse. Do not take any vitamin and mineral supplements unless approved by your health care provider. Many nutritional supplements can make kidney damage worse. Lifestyle  Do not use any products that contain nicotine or tobacco, such as cigarettes, e-cigarettes, and chewing tobacco. If you need help quitting, ask your health care provider. If you drink alcohol: Limit how much you use to: 0-1 drink a day for women who are not pregnant. 0-2 drinks a day for men. Know how much alcohol is in your drink. In the [place] one drink equals one 12 oz bottle of beer (355 mL), one 5 oz glass of wine (148 mL), or one 1 oz glass of hard liquor (44 mL). Maintain a healthy weight. If you need help, ask your health care provider. General instructions  Follow instructions from your health care provider about eating or drinking restrictions, including any prescribed diet. Track your blood pressure at home. Report changes in your blood pressure as told. If you are being treated for diabetes, track your blood glucose levels as told. Start or continue an exercise plan. Exercise at least 30 minutes a day, 5 days a week. Keep your immunizations up to date as told. Keep all follow-up visits. This is important. Where to find more information American  Association of Kidney Patients: BombTimer.gl National Kidney Foundation: www.kidney.[place]: https://mathis.com/ Life Options: www.lifeoptions.org Kidney School: www.kidneyschool.org Contact a health care provider if: Your symptoms get worse. You develop new symptoms. Get help right away if: You develop symptoms of ESRD. These include: Headaches. Numbness in your hands or feet. Easy bruising. Frequent hiccups. Chest pain. Shortness of breath. Lack of menstrual periods, in women. You have a fever. You are producing less urine than usual. You have pain or bleeding when you urinate or when you have a bowel movement. These symptoms may represent a serious problem that is an emergency. Do not wait to see if the symptoms will go away. Get medical help right away. Call your local emergency services (911 in the [place] Do not drive yourself to the hospital. Summary Chronic kidney disease (CKD) occurs when the kidneys become damaged slowly over a long period of time. The most common causes of this condition are diabetes and high blood pressure (hypertension). There is no cure for most cases of CKD, but treatment usually relieves symptoms and prevents or slows the worsening of the disease. Treatment may include a combination of lifestyle changes, medicines, and dialysis. This information is not intended to replace advice given to you by your health care provider. Make sure you discuss any questions you have with your health care provider. Document Revised: 09/11/2019 Document Reviewed: 09/11/2019 Elsevier Patient Education  [place].  Food Basics for Chronic Kidney Disease Chronic kidney disease (CKD) occurs when the kidneys are permanently damaged over a long period of time. When your kidneys are not working well, they cannot remove waste, fluids, and other substances from your blood as well as they did before. The substances can build up, which can worsen kidney damage and  affect how your body functions. Certain foods lead to a buildup of these substances. By changing your diet, you can help prevent more kidney damage and delay or prevent the need for dialysis. What are tips for following this plan? Reading food labels Check the amount of salt (sodium) in foods. Choose foods that have less than 300 milligrams (mg) per serving. Check the ingredient list for phosphorus or potassium-based additives or preservatives. Check the amount of saturated fat and trans fat. Limit or avoid these fats as told by your dietitian. Shopping Avoid buying foods that are: Processed or prepackaged. Calcium-enriched or that have calcium added to them (are fortified). Do not buy foods that have salt or sodium listed among the first five ingredients. Buy canned vegetables and beans that say "no salt added" or "low sodium" and rinse them before eating. Cooking Soak vegetables, such as potatoes, before cooking to reduce potassium. To do this: Peel and cut the vegetables into small pieces. Soak the vegetables in warm water for at least 2 hours. For every 1 cup of vegetables, use 10 cups of water. Drain and rinse the vegetables with warm water. Boil the vegetables for at least 5 minutes. Meal planning Limit the amount of protein you eat from plant and animal sources each day. Do not add salt to food when cooking or before eating. Eat meals and snacks at around the same time each day. General information Talk with your health care provider about whether you should take a vitamin and mineral supplement. Use standard measuring cups and spoons to measure servings of foods. Use a kitchen scale to measure portions of protein foods. If told by your health care provider, avoid drinking too much fluid. Measure and count all liquids, including water, ice, soups, flavored gelatin, and frozen desserts such as ice pops or ice cream. If you have diabetes: If you have diabetes (diabetes mellitus) and  CKD, it is important to keep your blood sugar (glucose) in the target range recommended by your health care provider. Follow your diabetes management plan. This may include: Checking your blood glucose regularly. Taking medicines by mouth, taking insulin, or taking both. Exercising for at least 30 minutes on 5 or more days each week, or as told by your health care provider. Tracking how many servings of carbohydrates you eat at each meal. You may be given specific guidelines on how much of certain foods and nutrients you may eat, depending on your stage of kidney disease and whether you have high blood pressure (hypertension). Follow your meal plan as told by your dietitian. What nutrients should I limit? Work with your health care provider and dietitian to develop a meal plan that is right for you. Foods you can eat and foods you should limit or avoid will depend on the stage of your kidney disease and any other health conditions you have. The items listed below are not a complete list. Talk with your dietitian about what dietary choices are best for you. Potassium Potassium affects how steadily your heart beats. If too much potassium builds up in your blood, the potassium can cause an irregular heartbeat or even a heart attack.  You may need to limit or avoid foods that are high in potassium, such as: Milk and soy milk. Fruits, such as bananas, apricots, nectarines, melon, prunes, raisins, kiwi, and oranges. Vegetables, such as potatoes, sweet potatoes, yams, tomatoes, leafy greens, beets, avocado, pumpkin, and winter squash. White and lima beans. Whole-wheat breads and pastas. Beans and nuts. Phosphorus Phosphorus is a mineral found in your bones. A balance between calcium and phosphorus is needed to build and maintain healthy bones. Too much phosphorus pulls calcium from your bones. This can make your bones weak and more likely to break. Too much phosphorus can also make your skin itch. You may  need to limit or avoid foods that are high in phosphorus, such as: Milk and dairy products. Dried beans and peas. Tofu, soy milk, and other soy-based meat replacements. Dark-colored sodas. Nuts and peanut butter. Meat, poultry, and fish. Bran cereals and oatmeal. Protein Protein helps you make and keep muscle. It also helps to repair your body's cells and tissues. One of the natural breakdown products of protein is a waste product called urea. When your kidneys are not working properly, they cannot remove wastes, such as urea. Reducing how much protein you eat can help prevent a buildup of urea in your blood. Depending on your stage of kidney disease, you may need to limit foods that are high in protein. Sources of animal protein include: Meat (all types). Fish and seafood. Poultry. Eggs. Dairy. Other protein foods include: Beans and legumes. Nuts and nut butter. Soy and tofu.  Sodium Sodium helps to maintain a healthy balance of fluids in your body. Too much sodium can increase your blood pressure and have a negative effect on your heart and lungs. Too much sodium can also cause your body to retain too much fluid, making your kidneys work harder. Most people should have less than 2,300 mg of sodium each day. If you have hypertension, you may need to limit your sodium to 1,500 mg each day. You may need to limit or avoid foods that are high in sodium, such as: Salt seasonings. Soy sauce. Cured and processed meats. Salted crackers and snack foods. Fast food. Canned soups and most canned foods. Pickled foods. Vegetable juice. Boxed mixes or ready-to-eat boxed meals and side dishes. Bottled dressings, sauces, and marinades. Talk with your dietitian about how much potassium, phosphorus, protein, and sodium you may have each day. Summary Chronic kidney disease (CKD) can lead to a buildup of waste and extra substances in the body. Certain foods lead to a buildup of these substances. By  changing your diet as told, you can help prevent more kidney damage and delay or prevent the need for dialysis. Food intake changes are different for each person with CKD. Work with a dietitian to set up nutrient goals and a meal plan that is right for you. If you have diabetes and CKD, it is important to keep your blood sugar in the target range recommended by your health care provider. This information is not intended to replace advice given to you by your health care provider. Make sure you discuss any questions you have with your health care provider. Document Revised: 09/30/2019 Document Reviewed: 09/30/2019 Elsevier Patient Education  2022 [employer].

## 2021-08-02 NOTE — Progress Notes (Signed)
Acute Office Visit  Subjective:    Patient ID: Courtney [name], female    DOB: Dec 30, 1945, 76 y.o.   MRN: [id number]  Chief Complaint  Patient presents with   Leg Pain    HPI: Courtney [name] is a 76 year old Caucasian female that presents for evaluation of bilateral leg pain at night. Denies leg cramps, describes pain as bilateral "aching" when she lies down. Onset of symptoms was approximately one month ago after d/c Brilinta. Treatment includes Asa 325 mg BID. Courtney [name] tells me she is concerned with CKD. Last eGFR 35 on 07/21/21, she tells me that eGFR was 51 three month ago. She admits to taking an increased amount of Ibuprofen a few weeks ago for chronic shoulder pain.  She has requested stat CMP today. She has an appt with [facility] specialist on 08/14/21.  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 01/21/1516   Followed in Pulmonary clinic/ [facility]/ [facility]    Cardiac dysrhythmia    Chronic idiopathic constipation    Colitis, acute 02/08/2021   Depression, major, recurrent, moderate (HCC)    Headache    Heart murmur    History of bladder infections    History of COVID-19    Hyperlipidemia    Hypertension    Hypothyroidism    Knee pain, bilateral    Melena 02/08/2021   Osteoarthritis    Pneumonia    Primary insomnia    Recovering alcoholic (HCC)    SVT (supraventricular tachycardia) (HCC)    UTI (urinary tract infection)    Varicose veins    Vitamin B12 deficiency     Past Surgical History:  Procedure Laterality Date   ABDOMINAL HYSTERECTOMY  1991   ANGIOPLASTY     at least 15 years ago, pt. denies    APPENDECTOMY     AUGMENTATION MAMMAPLASTY Bilateral    BACK SURGERY     had 2 surgeries. Have rods placed in back    BARIATRIC SURGERY     lap band-at least 14 years ago   BREAST BIOPSY Left    x2   BREAST ENHANCEMENT SURGERY  2006   CHOLECYSTECTOMY   12/2019   removed lap band.    COLONOSCOPY  12/01/2016   Moderate predominantly sigmod diverticulosis. Otherwise grossly normal colonoscopy   EYE SURGERY     IOL- bilateral - [place]   KNEE ARTHROSCOPY Left    lap band surgery  1981   LUMBAR FUSION  07/29/2015   posterior level one   removal of cervical disc fragments  10/2007   pt. denies     Family History  Problem Relation Age of Onset   Heart disease Mother    Heart disease Father    Diabetes Sister    Other Sister        BRAIN TUMOR   Heart disease Brother    Heart disease Brother    Heart disease Brother    Heart disease Brother    Heart disease Brother    Colon cancer Maternal Aunt    Colon cancer Cousin     Social History   Socioeconomic History   Marital status: Married    Spouse name: Not on file   Number of children: 2   Years of education: Not on file   Highest education level: Not on file  Occupational History   Occupation: retired  Comment: nurse  Tobacco Use   Smoking status: Never   Smokeless tobacco: Never  Vaping Use   Vaping Use: Never used  Substance and Sexual Activity   Alcohol use: No    Alcohol/week: 0.0 standard drinks    Comment: recovery x 20 yrs   Drug use: No   Sexual activity: Not Currently    Comment: MARRIED  Other Topics Concern   Not on file  Social History Narrative   Not on file   Social Determinants of Health   Financial Resource Strain: Not on file  Food Insecurity: No Food Insecurity   Worried About Charity fundraiser in the Last Year: Never true   Ran Out of Food in the Last Year: Never true  Transportation Needs: No Transportation Needs   Lack of Transportation (Medical): No   Lack of Transportation (Non-Medical): No  Physical Activity: Not on file  Stress: Not on file  Social Connections: Not on file  Intimate Partner Violence: Not on file    Outpatient Medications Prior to Visit  Medication Sig Dispense Refill   albuterol (VENTOLIN HFA) 108 (90  Base) MCG/ACT inhaler Inhale into the lungs.     aspirin 81 MG chewable tablet Chew 81 mg by mouth daily.     budesonide-formoterol (SYMBICORT) 160-4.5 MCG/ACT inhaler Inhale 2 puffs into the lungs in the morning and at bedtime. 1 each 6   buPROPion (WELLBUTRIN XL) 150 MG 24 hr tablet Start on wellbutrin xl 150 mg once in am x 1 week, then increase to 300 mg daily in am. 180 tablet 0   cetirizine (ZYRTEC) 10 MG tablet Take 1 tablet (10 mg total) by mouth daily. 90 tablet 3   Cholecalciferol (D3-50 PO) Take by mouth.     furosemide (LASIX) 20 MG tablet TAKE 1 TABLET(20 MG) BY MOUTH DAILY 90 tablet 1   gabapentin (NEURONTIN) 600 MG tablet Take 1 tablet (600 mg total) by mouth at bedtime. As needed 90 tablet 1   ipratropium (ATROVENT) 0.02 % nebulizer solution Take 2.5 mLs (0.5 mg total) by nebulization 4 (four) times daily. 100 mL 12   levothyroxine (SYNTHROID) 88 MCG tablet TAKE 1 TABLET(88 MCG) BY MOUTH DAILY BEFORE AND BREAKFAST 90 tablet 2   lisinopril (ZESTRIL) 5 MG tablet Take 5 mg by mouth daily.     Menaquinone-7 (K2 PO) Take by mouth.     metFORMIN (GLUCOPHAGE) 500 MG tablet Take 1 tablet (500 mg total) by mouth daily with breakfast. 90 tablet 0   metoprolol tartrate (LOPRESSOR) 50 MG tablet TAKE 1 TABLET(50 MG) BY MOUTH TWICE DAILY WITH MEALS 180 tablet 1   montelukast (SINGULAIR) 10 MG tablet Take 1 tablet (10 mg total) by mouth at bedtime. 90 tablet 3   Multiple Vitamins-Minerals (MULTIVITAMIN WITH MINERALS) tablet Take 1 tablet by mouth daily.     nitroGLYCERIN (NITROSTAT) 0.4 MG SL tablet Place 1 tablet (0.4 mg total) under the tongue every 5 (five) minutes as needed for chest pain. 25 tablet 0   oxyCODONE-acetaminophen (PERCOCET) 10-325 MG tablet Take by mouth.     potassium chloride SA (KLOR-CON M) 20 MEQ tablet TAKE 1 TABLET(20 MEQ) BY MOUTH DAILY 30 tablet 3   rosuvastatin (CRESTOR) 10 MG tablet Take by mouth.     tretinoin (RETIN-A) 0.05 % cream tretinoin 0.05 % topical cream      vitamin E 180 MG (400 UNITS) capsule Take 400 Units by mouth daily.     Facility-Administered Medications Prior to Visit  Medication Dose Route Frequency Provider Last Rate Last Admin   triamcinolone acetonide (KENALOG-40) injection 40 mg  40 mg Intra-articular Once Rip Harbour, NP        Allergies  Allergen Reactions   Levofloxacin Hives, Shortness Of Breath and Swelling    RASH IN MOUTH   (can take IV route)   Bactrim [Sulfamethoxazole-Trimethoprim] Hives and Itching   Clarithromycin     GI upset Other reaction(s): GI Upset (intolerance) GI upset -can take but does cause mild upset    Lyrica [Pregabalin]    Macrobid [Nitrofurantoin]     Not tolerate   Repatha [Evolocumab]     Legs hurt.    Statins Other (See Comments)    Myalgias    Review of Systems  Constitutional:  Negative for chills and fever.  HENT:  Negative for congestion and sore throat.   Respiratory:  Negative for cough and shortness of breath.   Cardiovascular:  Negative for chest pain.  Gastrointestinal:  Negative for abdominal pain and nausea.  Genitourinary:  Negative for dysuria, flank pain, frequency and urgency.  Musculoskeletal:  Positive for arthralgias (bilateral legs at night) and back pain.      Objective:    Physical Exam Vitals reviewed.  Constitutional:      Appearance: Normal appearance.  HENT:     Head: Normocephalic.     Right Ear: Tympanic membrane normal.     Left Ear: Tympanic membrane normal.     Nose: Nose normal.     Mouth/Throat:     Mouth: Mucous membranes are moist.  Eyes:     Pupils: Pupils are equal, round, and reactive to light.  Cardiovascular:     Rate and Rhythm: Normal rate and regular rhythm.     Pulses: Normal pulses.     Heart sounds: Normal heart sounds.  Pulmonary:     Effort: Pulmonary effort is normal.     Breath sounds: Normal breath sounds.  Abdominal:     General: Bowel sounds are normal.     Palpations: Abdomen is soft.  Musculoskeletal:         General: Normal range of motion.     Cervical back: Neck supple.  Skin:    General: Skin is warm and dry.     Capillary Refill: Capillary refill takes less than 2 seconds.  Neurological:     General: No focal deficit present.     Mental Status: She is alert and oriented to person, place, and time.  Psychiatric:        Mood and Affect: Mood normal.        Behavior: Behavior normal.    BP 124/76    Pulse 70    Temp (!) 96.8 F (36 C)    Ht '5\' 5"'  (1.651 m)    Wt 172 lb (78 kg)    SpO2 99%    BMI 28.62 kg/m  Wt Readings from Last 3 Encounters:  08/02/21 172 lb (78 kg)  07/21/21 178 lb 9.6 oz (81 kg)  07/19/21 180 lb (81.6 kg)    Health Maintenance Due  Topic Date Due   TETANUS/TDAP  Never done   Zoster Vaccines- Shingrix (1 of 2) Never done   COVID-19 Vaccine (3 - Mixed Product risk series) 09/09/2019       Lab Results  Component Value Date   TSH 3.160 07/19/2021   Lab Results  Component Value Date   WBC 8.7 07/19/2021   HGB 11.8 07/19/2021   HCT  35.2 07/19/2021   MCV 91 07/19/2021   PLT 226 07/19/2021   Lab Results  Component Value Date   NA 141 07/21/2021   K 4.9 07/21/2021   CO2 28 07/21/2021   GLUCOSE 90 07/21/2021   BUN 41 (H) 07/21/2021   CREATININE 1.55 (H) 07/21/2021   BILITOT 0.3 07/21/2021   ALKPHOS 88 07/21/2021   AST 12 07/21/2021   ALT 13 07/21/2021   PROT 7.0 07/21/2021   ALBUMIN 4.7 07/21/2021   CALCIUM 9.7 07/21/2021   ANIONGAP 9 09/04/2018   EGFR 35 (L) 07/21/2021   Lab Results  Component Value Date   CHOL 268 (H) 07/19/2021   Lab Results  Component Value Date   HDL 57 07/19/2021   Lab Results  Component Value Date   LDLCALC 192 (H) 07/19/2021   Lab Results  Component Value Date   TRIG 110 07/19/2021   Lab Results  Component Value Date   CHOLHDL 4.7 (H) 07/19/2021   Lab Results  Component Value Date   HGBA1C 6.3 (H) 07/19/2021       Assessment & Plan:    1. Stage 3b chronic kidney disease (HCC) - POCT  URINALYSIS DIP (CLINITEK) - Comprehensive metabolic panel  2. Bilateral leg pain - Comprehensive metabolic panel -Wear compression socks -Wear good supportive shoes -Take Tylenol as directed for leg pain   Stop Aspirin 325 mg twice daily Avoid Ibuprofen Take Tylenol as needed for pain We will call you with lab results Follow-up as needed  Follow-up: PRN  An After Visit Summary was printed and given to the patient.  I, Rip Harbour, NP, have reviewed all documentation for this visit. The documentation on 08/02/21 for the exam, diagnosis, procedures, and orders are all accurate and complete.    Signed, Rip Harbour, NP [place] [phone]

## 2021-08-03 ENCOUNTER — Other Ambulatory Visit: Payer: Self-pay

## 2021-08-03 DIAGNOSIS — N1832 Chronic kidney disease, stage 3b: Secondary | ICD-10-CM

## 2021-08-06 ENCOUNTER — Other Ambulatory Visit: Payer: Self-pay

## 2021-08-12 DIAGNOSIS — N390 Urinary tract infection, site not specified: Secondary | ICD-10-CM | POA: Diagnosis not present

## 2021-08-12 DIAGNOSIS — N189 Chronic kidney disease, unspecified: Secondary | ICD-10-CM | POA: Diagnosis not present

## 2021-08-12 DIAGNOSIS — N1832 Chronic kidney disease, stage 3b: Secondary | ICD-10-CM | POA: Diagnosis not present

## 2021-08-12 DIAGNOSIS — N179 Acute kidney failure, unspecified: Secondary | ICD-10-CM | POA: Diagnosis not present

## 2021-08-12 DIAGNOSIS — I129 Hypertensive chronic kidney disease with stage 1 through stage 4 chronic kidney disease, or unspecified chronic kidney disease: Secondary | ICD-10-CM | POA: Diagnosis not present

## 2021-08-12 DIAGNOSIS — D631 Anemia in chronic kidney disease: Secondary | ICD-10-CM | POA: Diagnosis not present

## 2021-08-26 DIAGNOSIS — M961 Postlaminectomy syndrome, not elsewhere classified: Secondary | ICD-10-CM | POA: Diagnosis not present

## 2021-08-26 DIAGNOSIS — G894 Chronic pain syndrome: Secondary | ICD-10-CM | POA: Diagnosis not present

## 2021-08-26 DIAGNOSIS — M48062 Spinal stenosis, lumbar region with neurogenic claudication: Secondary | ICD-10-CM | POA: Diagnosis not present

## 2021-08-28 ENCOUNTER — Other Ambulatory Visit: Payer: Self-pay | Admitting: Family Medicine

## 2021-08-30 ENCOUNTER — Other Ambulatory Visit: Payer: Self-pay | Admitting: Family Medicine

## 2021-09-07 ENCOUNTER — Other Ambulatory Visit: Payer: Self-pay | Admitting: Nephrology

## 2021-09-07 DIAGNOSIS — N200 Calculus of kidney: Secondary | ICD-10-CM

## 2021-09-07 DIAGNOSIS — N390 Urinary tract infection, site not specified: Secondary | ICD-10-CM | POA: Diagnosis not present

## 2021-09-07 DIAGNOSIS — N179 Acute kidney failure, unspecified: Secondary | ICD-10-CM | POA: Diagnosis not present

## 2021-09-08 ENCOUNTER — Other Ambulatory Visit: Payer: Self-pay

## 2021-09-08 ENCOUNTER — Ambulatory Visit
Admission: RE | Admit: 2021-09-08 | Discharge: 2021-09-08 | Disposition: A | Discharge status: Home / Self Care | Payer: Medicare Other | Source: Ambulatory Visit | Attending: Nephrology | Admitting: Nephrology

## 2021-09-08 DIAGNOSIS — R3982 Chronic bladder pain: Secondary | ICD-10-CM | POA: Diagnosis not present

## 2021-09-08 DIAGNOSIS — R109 Unspecified abdominal pain: Secondary | ICD-10-CM | POA: Diagnosis not present

## 2021-09-08 DIAGNOSIS — N3020 Other chronic cystitis without hematuria: Secondary | ICD-10-CM | POA: Diagnosis not present

## 2021-09-08 DIAGNOSIS — N200 Calculus of kidney: Secondary | ICD-10-CM

## 2021-09-09 DIAGNOSIS — E785 Hyperlipidemia, unspecified: Secondary | ICD-10-CM | POA: Diagnosis not present

## 2021-09-09 DIAGNOSIS — N179 Acute kidney failure, unspecified: Secondary | ICD-10-CM | POA: Diagnosis not present

## 2021-09-09 DIAGNOSIS — N189 Chronic kidney disease, unspecified: Secondary | ICD-10-CM | POA: Diagnosis not present

## 2021-09-09 DIAGNOSIS — N3281 Overactive bladder: Secondary | ICD-10-CM | POA: Diagnosis not present

## 2021-09-09 DIAGNOSIS — Z20822 Contact with and (suspected) exposure to covid-19: Secondary | ICD-10-CM | POA: Diagnosis not present

## 2021-09-09 DIAGNOSIS — I129 Hypertensive chronic kidney disease with stage 1 through stage 4 chronic kidney disease, or unspecified chronic kidney disease: Secondary | ICD-10-CM | POA: Diagnosis not present

## 2021-09-09 DIAGNOSIS — N390 Urinary tract infection, site not specified: Secondary | ICD-10-CM | POA: Diagnosis not present

## 2021-09-20 ENCOUNTER — Encounter: Payer: Self-pay | Admitting: Family Medicine

## 2021-09-20 ENCOUNTER — Ambulatory Visit (INDEPENDENT_AMBULATORY_CARE_PROVIDER_SITE_OTHER): Payer: Medicare Other | Admitting: Family Medicine

## 2021-09-20 VITALS — BP 128/72 | HR 83 | Temp 97.7°F | Ht 65.0 in | Wt 165.0 lb

## 2021-09-20 DIAGNOSIS — M7541 Impingement syndrome of right shoulder: Secondary | ICD-10-CM

## 2021-09-20 MED ORDER — TRIAMCINOLONE ACETONIDE 40 MG/ML IJ SUSP: 40.0000 mg | Freq: Once | INTRAMUSCULAR | Status: DC

## 2021-09-20 NOTE — Progress Notes (Signed)
? ?Acute Office Visit ? ?Subjective:  ? ? Patient ID: Courtney [name], female    DOB: 29-Nov-1945, 76 y.o.   MRN: [id number] ? ?Chief Complaint  ?Patient presents with  ? Right shoulder pain  ? ?HPI ?Patient is in today for right shoulder pain. She is unsure what she did. No injury noted. It has been very painful for 2 weeks. Requested kenalog injection. ? ?Past Medical History:  ?Diagnosis Date  ? Anemia   ? Arthritis   ? Bronchiectasis ([place])   ? 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 Followed in Pulmonary clinic/ [facility]/ [facility]   ? Cardiac dysrhythmia   ? Chronic idiopathic constipation   ? Colitis, acute 02/08/2021  ? Depression, major, recurrent, moderate ([place])   ? Headache   ? Heart murmur   ? History of bladder infections   ? History of COVID-19   ? Hyperlipidemia   ? Hypertension   ? Hypothyroidism   ? Knee pain, bilateral   ? Melena 02/08/2021  ? Osteoarthritis   ? Pneumonia   ? Primary insomnia   ? Recovering alcoholic ([place])   ? SVT (supraventricular tachycardia) ([place])   ? UTI (urinary tract infection)   ? Varicose veins   ? Vitamin B12 deficiency   ? ? ?Past Surgical History:  ?Procedure Laterality Date  ? ABDOMINAL HYSTERECTOMY  1991  ? ANGIOPLASTY    ? at least 15 years ago, pt. denies   ? APPENDECTOMY    ? AUGMENTATION MAMMAPLASTY Bilateral   ? BACK SURGERY    ? had 2 surgeries. Have rods placed in back   ? BARIATRIC SURGERY    ? lap band-at least 14 years ago  ? BREAST BIOPSY Left   ? x2  ? BREAST ENHANCEMENT SURGERY  2006  ? CHOLECYSTECTOMY  12/2019  ? removed lap band.   ? COLONOSCOPY  12/01/2016  ? Moderate predominantly sigmod diverticulosis. Otherwise grossly normal colonoscopy  ? EYE SURGERY    ? IOL- bilateral - [place]  ? KNEE ARTHROSCOPY Left   ? lap band surgery  1981  ? LUMBAR FUSION  07/29/2015  ? posterior level one  ? removal of cervical disc fragments  10/2007  ? pt. denies   ? ? ?Family History  ?Problem Relation Age of Onset  ? Heart disease Mother    ? Heart disease Father   ? Diabetes Sister   ? Other Sister   ?     BRAIN TUMOR  ? Heart disease Brother   ? Heart disease Brother   ? Heart disease Brother   ? Heart disease Brother   ? Heart disease Brother   ? Colon cancer Maternal Aunt   ? Colon cancer Cousin   ? ? ?Social History  ? ?Socioeconomic History  ? Marital status: Married  ?  Spouse name: Not on file  ? Number of children: 2  ? Years of education: Not on file  ? Highest education level: Not on file  ?Occupational History  ? 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 Smoking status: Never  ? Smokeless tobacco: Never  ?Vaping Use  ? Vaping Use: Never used  ?Substance and Sexual Activity  ? Alcohol use: No  ?  Alcohol/week: 0.0 standard drinks  ?  Comment: recovery x 20 yrs  ? Drug use: No  ? Sexual activity: Not Currently  ?  Comment: MARRIED  ?Other Topics Concern  ? Not on file  ?  Social History Narrative  ? Not on file  ? ?Social Determinants of Health  ? ?Financial Resource Strain: Not on file  ?Food Insecurity: No Food Insecurity  ? Worried About Charity fundraiser in the Last Year: Never true  ? Ran Out of Food in the Last Year: Never true  ?Transportation Needs: No Transportation Needs  ? Lack of Transportation (Medical): No  ? Lack of Transportation (Non-Medical): No  ?Physical Activity: Not on file  ?Stress: Not on file  ?Social Connections: Not on file  ?Intimate Partner Violence: Not on file  ? ? ?Outpatient Medications Prior to Visit  ?Medication Sig Dispense Refill  ? albuterol (VENTOLIN HFA) 108 (90 Base) MCG/ACT inhaler Inhale into the lungs.    ? amLODipine (NORVASC) 5 MG tablet Take by mouth.    ? aspirin 81 MG chewable tablet Chew 81 mg by mouth daily.    ? budesonide-formoterol (SYMBICORT) 160-4.5 MCG/ACT inhaler Inhale 2 puffs into the lungs in the morning and at bedtime. 1 each 6  ? cetirizine (ZYRTEC) 10 MG tablet Take 1 tablet (10 mg total) by mouth daily. 90 tablet 3  ? Cholecalciferol (D3-50 PO) Take by mouth.    ?  furosemide (LASIX) 20 MG tablet TAKE 1 TABLET(20 MG) BY MOUTH DAILY 90 tablet 0  ? gabapentin (NEURONTIN) 600 MG tablet Take 1 tablet (600 mg total) by mouth at bedtime. As needed 90 tablet 1  ? ipratropium (ATROVENT) 0.02 % nebulizer solution Take 2.5 mLs (0.5 mg total) by nebulization 4 (four) times daily. 100 mL 12  ? levothyroxine (SYNTHROID) 88 MCG tablet TAKE 1 TABLET(88 MCG) BY MOUTH DAILY BEFORE AND BREAKFAST 90 tablet 2  ? lisinopril (ZESTRIL) 5 MG tablet Take 5 mg by mouth daily.    ? Menaquinone-7 (K2 PO) Take by mouth.    ? metoprolol tartrate (LOPRESSOR) 50 MG tablet TAKE 1 TABLET(50 MG) BY MOUTH TWICE DAILY WITH MEALS 180 tablet 1  ? montelukast (SINGULAIR) 10 MG tablet Take 1 tablet (10 mg total) by mouth at bedtime. 90 tablet 3  ? MOUNJARO 7.5 MG/0.5ML Pen Inject into the skin.    ? Multiple Vitamins-Minerals (MULTIVITAMIN WITH MINERALS) tablet Take 1 tablet by mouth daily.    ? nitroGLYCERIN (NITROSTAT) 0.4 MG SL tablet PLACE 1 TABLET UNDER THE TONGUE EVERY 5 MINUTES AS NEEDED FOR CHEST PAIN. 25 tablet 0  ? oxybutynin (DITROPAN) 5 MG tablet Take by mouth.    ? oxyCODONE-acetaminophen (PERCOCET) 10-325 MG tablet Take by mouth.    ? potassium chloride SA (KLOR-CON M) 20 MEQ tablet TAKE 1 TABLET(20 MEQ) BY MOUTH DAILY 30 tablet 3  ? rosuvastatin (CRESTOR) 10 MG tablet Take by mouth.    ? tiZANidine (ZANAFLEX) 4 MG tablet Take by mouth.    ? tretinoin (RETIN-A) 0.05 % cream tretinoin 0.05 % topical cream    ? vitamin E 180 MG (400 UNITS) capsule Take 400 Units by mouth daily.    ? buPROPion (WELLBUTRIN XL) 150 MG 24 hr tablet Start on wellbutrin xl 150 mg once in am x 1 week, then increase to 300 mg daily in am. 180 tablet 0  ? omeprazole (PRILOSEC) 40 MG capsule Take by mouth.    ? ?Facility-Administered Medications Prior to Visit  ?Medication Dose Route Frequency Provider Last Rate Last Admin  ? triamcinolone acetonide (KENALOG-40) injection 40 mg  40 mg Intra-articular Once Rip Harbour, NP       ? ? ?Allergies  ?Allergen Reactions  ? Levofloxacin  Hives, Shortness Of Breath and Swelling  ?  RASH IN MOUTH   (can take IV route)  ? Bactrim [Sulfamethoxazole-Trimethoprim] Hives and Itching  ? Clarithromycin   ?  GI upset ?Other reaction(s): GI Upset (intolerance) ?GI upset -can take but does cause mild upset   ? Lyrica [Pregabalin]   ? Macrobid [Nitrofurantoin]   ?  Not tolerate  ? Repatha [Evolocumab]   ?  Legs hurt.   ? Statins Other (See Comments)  ?  Myalgias  ? ? ?Review of Systems  ?Constitutional:  Negative for appetite change, fatigue and fever.  ?HENT:  Negative for congestion, ear pain, sinus pressure and sore throat.   ?Respiratory:  Negative for cough, chest tightness, shortness of breath and wheezing.   ?Cardiovascular:  Negative for chest pain and palpitations.  ?Gastrointestinal:  Negative for abdominal pain, constipation, diarrhea, nausea and vomiting.  ?Genitourinary:  Negative for dysuria and hematuria.  ?Musculoskeletal:  Positive for myalgias (Right shoulder pain). Negative for arthralgias, back pain and joint swelling.  ?Skin:  Negative for rash.  ?Neurological:  Negative for dizziness, weakness and headaches.  ?Psychiatric/Behavioral:  Negative for dysphoric mood. The patient is not nervous/anxious.   ? ?   ?Objective:  ?  ?Physical Exam ?Vitals reviewed.  ?Constitutional:   ?   Appearance: Normal appearance.  ?Musculoskeletal:     ?   General: Tenderness (anterior right shoulder.abnormal ROM - abduction/external and internal rotation causes discomfort.) present.  ?Neurological:  ?   Mental Status: She is alert.  ? ? ?BP 128/72 (BP Location: Left Arm, Patient Position: Sitting)   Pulse 83   Temp 97.7 ?F (36.5 ?C) (Oral)   Ht '5\' 5"'$  (1.651 m)   Wt 165 lb (74.8 kg)   SpO2 97%   BMI 27.46 kg/m?  ?Wt Readings from Last 3 Encounters:  ?09/20/21 165 lb (74.8 kg)  ?08/02/21 172 lb (78 kg)  ?07/21/21 178 lb 9.6 oz (81 kg)  ? ? ?Health Maintenance Due  ?Topic Date Due  ? TETANUS/TDAP  Never  done  ? Zoster Vaccines- Shingrix (1 of 2) Never done  ? COVID-19 Vaccine (3 - Mixed Product risk series) 09/09/2019  ? ? ?There are no preventive care reminders to display for this patient. ? ? ?Lab Results

## 2021-09-21 ENCOUNTER — Telehealth: Payer: Self-pay

## 2021-09-21 NOTE — Progress Notes (Signed)
? ? ?Chronic Care Management ?Pharmacy Assistant  ? ?Name: AKEIRA LAHM  MRN: [id number] DOB: [birth year] ? ? ?Reason for Encounter: General Adherence Call ? ?Recent office visits:  ?09/20/21 [name] MD. Seen for Shoulder Pain. No med changes. ? ?08/02/21 Jerrell Belfast NP. Seen for CKD. Stop Aspirin 325 mg twice daily. Avoid Ibuprofen.Take Tylenol as needed for pain. ? ?Recent consult visits:  ?08/26/21 (Pain Clinic) Vicenta Aly. Seen for Failed back syndrome. Reordered Oxycodone-APAP 10-'325mg'$ .  ? ?Hospital visits:  ?None ? ?Medications: ?Outpatient Encounter Medications as of 09/21/2021  ?Medication Sig  ? albuterol (VENTOLIN HFA) 108 (90 Base) MCG/ACT inhaler Inhale into the lungs.  ? amLODipine (NORVASC) 5 MG tablet Take by mouth.  ? aspirin 81 MG chewable tablet Chew 81 mg by mouth daily.  ? budesonide-formoterol (SYMBICORT) 160-4.5 MCG/ACT inhaler Inhale 2 puffs into the lungs in the morning and at bedtime.  ? buPROPion (WELLBUTRIN XL) 150 MG 24 hr tablet Start on wellbutrin xl 150 mg once in am x 1 week, then increase to 300 mg daily in am.  ? cetirizine (ZYRTEC) 10 MG tablet Take 1 tablet (10 mg total) by mouth daily.  ? Cholecalciferol (D3-50 PO) Take by mouth.  ? furosemide (LASIX) 20 MG tablet TAKE 1 TABLET(20 MG) BY MOUTH DAILY  ? gabapentin (NEURONTIN) 600 MG tablet Take 1 tablet (600 mg total) by mouth at bedtime. As needed  ? ipratropium (ATROVENT) 0.02 % nebulizer solution Take 2.5 mLs (0.5 mg total) by nebulization 4 (four) times daily.  ? levothyroxine (SYNTHROID) 88 MCG tablet TAKE 1 TABLET(88 MCG) BY MOUTH DAILY BEFORE AND BREAKFAST  ? lisinopril (ZESTRIL) 5 MG tablet Take 5 mg by mouth daily.  ? Menaquinone-7 (K2 PO) Take by mouth.  ? metoprolol tartrate (LOPRESSOR) 50 MG tablet TAKE 1 TABLET(50 MG) BY MOUTH TWICE DAILY WITH MEALS  ? montelukast (SINGULAIR) 10 MG tablet Take 1 tablet (10 mg total) by mouth at bedtime.  ? MOUNJARO 7.5 MG/0.5ML Pen Inject into the skin.  ? Multiple  Vitamins-Minerals (MULTIVITAMIN WITH MINERALS) tablet Take 1 tablet by mouth daily.  ? nitroGLYCERIN (NITROSTAT) 0.4 MG SL tablet PLACE 1 TABLET UNDER THE TONGUE EVERY 5 MINUTES AS NEEDED FOR CHEST PAIN.  ? omeprazole (PRILOSEC) 40 MG capsule Take by mouth.  ? oxybutynin (DITROPAN) 5 MG tablet Take by mouth.  ? oxyCODONE-acetaminophen (PERCOCET) 10-325 MG tablet Take by mouth.  ? potassium chloride SA (KLOR-CON M) 20 MEQ tablet TAKE 1 TABLET(20 MEQ) BY MOUTH DAILY  ? rosuvastatin (CRESTOR) 10 MG tablet Take by mouth.  ? tiZANidine (ZANAFLEX) 4 MG tablet Take by mouth.  ? tretinoin (RETIN-A) 0.05 % cream tretinoin 0.05 % topical cream  ? vitamin E 180 MG (400 UNITS) capsule Take 400 Units by mouth daily.  ? ?Facility-Administered Encounter Medications as of 09/21/2021  ?Medication  ? triamcinolone acetonide (KENALOG-40) injection 40 mg  ? ? ?Contacted [facility] for general disease state and medication adherence call.  ? ?Patient is not > 5 days past due for refill on the following medications per chart history: ? ?[facility] Medications: ?Medication Name/mg Last Fill Days Supply ?Lisinopril '5mg'$                           08/19/21  90ds ?06/04/21          90ds ?                                                ? ?  What concerns do you have about your medications? Pt denies any concerns  ? ?The patient denies side effects with her medications. Pt has stopped the Rosuvastatin due to muscles aches and cannot take any statins. She is now on Mounjaro 7.'5mg'$  for Cholesterol. She also has stopped the Metformin due to it making her feel dizzy.  ? ?How often do you forget or accidentally miss a dose? Never ? ?Do you use a pillbox? Yes ? ?Are you having any problems getting your medications from your pharmacy? No ? ?Has the cost of your medications been a concern? No ? ?Since last visit with CPP, no interventions have been made:  ? ?The patient has not had an ED visit since last contact.  ? ?The patient denies problems with their health.   ? ?she denies  concerns or questions for [place], at this time.  ? ? ? ?Care Gaps: ?Last annual wellness visit? None noted  ?Colonscopy: 12/01/16 ?Dexa Scan: 07/28/17 ?If applicable:N/A ?Last eye exam / retinopathy screening? ?Diabetic foot exam? ? ? ?[name], CMA ?Clinical Pharmacist Assistant  ?[phone]  ?

## 2021-09-22 DIAGNOSIS — N3020 Other chronic cystitis without hematuria: Secondary | ICD-10-CM | POA: Diagnosis not present

## 2021-09-22 DIAGNOSIS — N952 Postmenopausal atrophic vaginitis: Secondary | ICD-10-CM | POA: Diagnosis not present

## 2021-09-28 ENCOUNTER — Other Ambulatory Visit: Payer: Self-pay

## 2021-09-28 DIAGNOSIS — M7541 Impingement syndrome of right shoulder: Secondary | ICD-10-CM

## 2021-09-29 DIAGNOSIS — M25511 Pain in right shoulder: Secondary | ICD-10-CM | POA: Diagnosis not present

## 2021-09-30 ENCOUNTER — Other Ambulatory Visit: Payer: Self-pay | Admitting: Family Medicine

## 2021-09-30 DIAGNOSIS — M7541 Impingement syndrome of right shoulder: Secondary | ICD-10-CM | POA: Insufficient documentation

## 2021-09-30 NOTE — Assessment & Plan Note (Signed)
Risks were discussed including bleeding, infection, increase in sugars if diabetic, atrophy at site of injection, and increased pain.  After consent was obtained, using sterile technique the  shoulder was prepped with alcohol.  The joint was entered posteriorly and  Kenalog 40 mg and 5 ml plain Lidocaine was then injected and the needle withdrawn.  The procedure was well tolerated.   The patient is asked to continue to rest the joint for a few more days before resuming regular activities.  It may be more painful for the first 1-2 days.  Watch for fever, or increased swelling or persistent pain in the joint. Call or return to clinic prn if such symptoms occur or there is failure to improve as anticipated. 

## 2021-10-11 DIAGNOSIS — Z20822 Contact with and (suspected) exposure to covid-19: Secondary | ICD-10-CM | POA: Diagnosis not present

## 2021-10-20 ENCOUNTER — Ambulatory Visit (INDEPENDENT_AMBULATORY_CARE_PROVIDER_SITE_OTHER): Payer: Medicare Other | Admitting: Family Medicine

## 2021-10-20 VITALS — BP 104/64 | HR 100 | Temp 97.6°F | Resp 18 | Ht 65.0 in | Wt 163.0 lb

## 2021-10-20 DIAGNOSIS — R7303 Prediabetes: Secondary | ICD-10-CM

## 2021-10-20 DIAGNOSIS — N1832 Chronic kidney disease, stage 3b: Secondary | ICD-10-CM

## 2021-10-20 DIAGNOSIS — E782 Mixed hyperlipidemia: Secondary | ICD-10-CM

## 2021-10-20 DIAGNOSIS — E038 Other specified hypothyroidism: Secondary | ICD-10-CM | POA: Diagnosis not present

## 2021-10-20 DIAGNOSIS — I119 Hypertensive heart disease without heart failure: Secondary | ICD-10-CM | POA: Diagnosis not present

## 2021-10-20 DIAGNOSIS — J479 Bronchiectasis, uncomplicated: Secondary | ICD-10-CM

## 2021-10-20 DIAGNOSIS — E559 Vitamin D deficiency, unspecified: Secondary | ICD-10-CM

## 2021-10-20 DIAGNOSIS — I2510 Atherosclerotic heart disease of native coronary artery without angina pectoris: Secondary | ICD-10-CM | POA: Diagnosis not present

## 2021-10-20 DIAGNOSIS — N3001 Acute cystitis with hematuria: Secondary | ICD-10-CM

## 2021-10-20 DIAGNOSIS — R300 Dysuria: Secondary | ICD-10-CM

## 2021-10-20 LAB — POCT URINALYSIS DIPSTICK
Bilirubin, UA: 0.2
Glucose, UA: NEGATIVE
Ketones, UA: NEGATIVE
Nitrite, UA: NEGATIVE
Protein, UA: POSITIVE — AB
Spec Grav, UA: 1.01 (ref 1.010–1.025)
Urobilinogen, UA: 0.2 E.U./dL
pH, UA: 5 (ref 5.0–8.0)

## 2021-10-20 MED ORDER — TIRZEPATIDE 10 MG/0.5ML ~~LOC~~ SOAJ: 10.0000 mg | SUBCUTANEOUS | 0 refills | Status: DC

## 2021-10-20 MED ORDER — CIPROFLOXACIN HCL 250 MG PO TABS: 250.0000 mg | ORAL_TABLET | Freq: Two times a day (BID) | ORAL | 0 refills | Status: DC

## 2021-10-20 NOTE — Progress Notes (Signed)
? ?Subjective:  ?Patient ID: Courtney [name], female    DOB: 03-02-1946  Age: 76 y.o. MRN: [id number] ? ?Chief Complaint  ?Patient presents with  ? Hyperlipidemia  ? Hypothyroidism  ? Dysuria  ? ? ?HPI ? Prediabetes: Patient is eating low carbs diet. Last a1c 6.3. Patient is on mounjaro 7.5 mg once weekly. Insurance is covering although still expensive. $200.00. Lost 10 lbs.   ?Hyperlipidemia: Patient is taking K2, D3, and balance of nature. Intolerant statins and repatha. She is eating healthy ( low sugar and low fat diet.)  ? ?Hypothyroidism: She takes levothyroxine 88 mcg daily. TSH: 3.160. ? ?CORONARY ARTERY DISEASE: Metoprolol tartrate 50 mg twice daily, on lisinopril 5 mg daily. Lasix 20 mg once daily. Cardiology stopped potassium and aspirin at her most recent visit.  Patient sees Dr. Philbert Riser.  ? ?Bronchiolitis: off inhalers (was on symbicort 2 puffs twice daily and singulair 10 mg before bed. Still on zyrtec 10 mg daily. Has not needed ventolin for 5 months.  ?Exercising: walking.  ? ?Her husband is in ESRD and on dialysis.  ? ?Current Outpatient Medications on File Prior to Visit  ?Medication Sig Dispense Refill  ? albuterol (VENTOLIN HFA) 108 (90 Base) MCG/ACT inhaler Inhale into the lungs.    ? amLODipine (NORVASC) 5 MG tablet Take by mouth.    ? cetirizine (ZYRTEC) 10 MG tablet Take 1 tablet (10 mg total) by mouth daily. 90 tablet 3  ? Cholecalciferol (D3-50 PO) Take by mouth.    ? furosemide (LASIX) 20 MG tablet TAKE 1 TABLET(20 MG) BY MOUTH DAILY 90 tablet 0  ? gabapentin (NEURONTIN) 600 MG tablet Take 1 tablet (600 mg total) by mouth at bedtime. As needed 90 tablet 1  ? levothyroxine (SYNTHROID) 88 MCG tablet TAKE 1 TABLET(88 MCG) BY MOUTH DAILY BEFORE AND BREAKFAST 90 tablet 2  ? lisinopril (ZESTRIL) 5 MG tablet Take 5 mg by mouth daily.    ? metoprolol tartrate (LOPRESSOR) 50 MG tablet TAKE 1 TABLET(50 MG) BY MOUTH TWICE DAILY WITH MEALS 180 tablet 1  ? Multiple Vitamins-Minerals (MULTIVITAMIN WITH  MINERALS) tablet Take 1 tablet by mouth daily.    ? nitroGLYCERIN (NITROSTAT) 0.4 MG SL tablet PLACE 1 TABLET UNDER THE TONGUE EVERY 5 MINUTES AS NEEDED FOR CHEST PAIN. 25 tablet 0  ? oxyCODONE-acetaminophen (PERCOCET) 10-325 MG tablet Take by mouth.    ? tiZANidine (ZANAFLEX) 4 MG tablet Take by mouth.    ? tretinoin (RETIN-A) 0.05 % cream tretinoin 0.05 % topical cream    ? vitamin E 180 MG (400 UNITS) capsule Take 400 Units by mouth daily.    ? aspirin 81 MG chewable tablet Chew 81 mg by mouth daily. (Patient not taking: Reported on 10/20/2021)    ? budesonide-formoterol (SYMBICORT) 160-4.5 MCG/ACT inhaler Inhale 2 puffs into the lungs in the morning and at bedtime. (Patient not taking: Reported on 10/20/2021) 1 each 6  ? ipratropium (ATROVENT) 0.02 % nebulizer solution Take 2.5 mLs (0.5 mg total) by nebulization 4 (four) times daily. (Patient not taking: Reported on 10/20/2021) 100 mL 12  ? omeprazole (PRILOSEC) 40 MG capsule TAKE 1 CAPSULE(40 MG) BY MOUTH TWICE DAILY 180 capsule 0  ? ?Current Facility-Administered Medications on File Prior to Visit  ?Medication Dose Route Frequency Provider Last Rate Last Admin  ? triamcinolone acetonide (KENALOG-40) injection 40 mg  40 mg Intra-articular Once [name], MD      ? ?Past Medical History:  ?Diagnosis Date  ? Anemia   ?  Arthritis   ? Bronchiectasis ([place])   ? 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 Followed in Pulmonary clinic/ [facility]/ [place]   ? Cardiac dysrhythmia   ? Chronic idiopathic constipation   ? Colitis, acute 02/08/2021  ? Depression, major, recurrent, moderate ([place])   ? Headache   ? Heart murmur   ? History of bladder infections   ? History of COVID-19   ? Hyperlipidemia   ? Hypertension   ? Hypothyroidism   ? Knee pain, bilateral   ? Melena 02/08/2021  ? Osteoarthritis   ? Pneumonia   ? Primary insomnia   ? Recovering alcoholic ([place])   ? SVT (supraventricular tachycardia) ([place])   ? UTI (urinary tract infection)   ?  Varicose veins   ? Vitamin B12 deficiency   ? ?Past Surgical History:  ?Procedure Laterality Date  ? ABDOMINAL HYSTERECTOMY  1991  ? ANGIOPLASTY    ? at least 15 years ago, pt. denies   ? APPENDECTOMY    ? AUGMENTATION MAMMAPLASTY Bilateral   ? BACK SURGERY    ? had 2 surgeries. Have rods placed in back   ? BARIATRIC SURGERY    ? lap band-at least 14 years ago  ? BREAST BIOPSY Left   ? x2  ? BREAST ENHANCEMENT SURGERY  2006  ? CHOLECYSTECTOMY  12/2019  ? removed lap band.   ? COLONOSCOPY  12/01/2016  ? Moderate predominantly sigmod diverticulosis. Otherwise grossly normal colonoscopy  ? EYE SURGERY    ? IOL- bilateral - [place]  ? KNEE ARTHROSCOPY Left   ? lap band surgery  1981  ? LUMBAR FUSION  07/29/2015  ? posterior level one  ? removal of cervical disc fragments  10/2007  ? pt. denies   ?  ?Family History  ?Problem Relation Age of Onset  ? Heart disease Mother   ? Heart disease Father   ? Diabetes Sister   ? Other Sister   ?     BRAIN TUMOR  ? Heart disease Brother   ? Heart disease Brother   ? Heart disease Brother   ? Heart disease Brother   ? Heart disease Brother   ? Colon cancer Maternal Aunt   ? Colon cancer Cousin   ? ?Social History  ? ?Socioeconomic History  ? Marital status: Married  ?  Spouse name: Not on file  ? Number of children: 2  ? Years of education: Not on file  ? Highest education level: Not on file  ?Occupational History  ? 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 Smoking status: Never  ? Smokeless tobacco: Never  ?Vaping Use  ? Vaping Use: Never used  ?Substance and Sexual Activity  ? Alcohol use: No  ?  Alcohol/week: 0.0 standard drinks  ?  Comment: recovery x 20 yrs  ? Drug use: No  ? Sexual activity: Not Currently  ?  Comment: MARRIED  ?Other Topics Concern  ? Not on file  ?Social History Narrative  ? Not on file  ? ?Social Determinants of Health  ? ?Financial Resource Strain: Not on file  ?Food Insecurity: No Food Insecurity  ? Worried About Charity fundraiser in the  Last Year: Never true  ? Ran Out of Food in the Last Year: Never true  ?Transportation Needs: No Transportation Needs  ? Lack of Transportation (Medical): No  ? Lack of Transportation (Non-Medical): No  ?Physical Activity: Not on file  ?Stress:  Not on file  ?Social Connections: Not on file  ? ? ?Review of Systems  ?Constitutional:  Negative for chills, fatigue and fever.  ?HENT:  Negative for congestion, rhinorrhea and sore throat.   ?Respiratory:  Negative for cough and shortness of breath.   ?Cardiovascular:  Negative for chest pain.  ?Gastrointestinal:  Negative for abdominal pain, constipation, diarrhea, nausea and vomiting.  ?Genitourinary:  Positive for dysuria. Negative for urgency.  ?Musculoskeletal:  Negative for back pain and myalgias.  ?Neurological:  Negative for dizziness, weakness, light-headedness and headaches.  ?Psychiatric/Behavioral:  Negative for dysphoric mood. The patient is not nervous/anxious.   ? ? ?Objective:  ?BP 104/64   Pulse 100   Temp 97.6 ?F (36.4 ?C)   Resp 18   Ht '5\' 5"'$  (1.651 m)   Wt 163 lb (73.9 kg)   BMI 27.12 kg/m?  ? ? ?  10/20/2021  ?  8:17 AM 09/20/2021  ? 11:45 AM 08/02/2021  ? 10:13 AM  ?BP/Weight  ?Systolic BP 449 675 916  ?Diastolic BP 64 72 76  ?Wt. (Lbs) 163 165 172  ?BMI 27.12 kg/m2 27.46 kg/m2 28.62 kg/m2  ? ? ?Physical Exam ?Vitals reviewed.  ?Constitutional:   ?   Appearance: Normal appearance.  ?Neck:  ?   Vascular: No carotid bruit.  ?Cardiovascular:  ?   Rate and Rhythm: Normal rate and regular rhythm.  ?   Heart sounds: Normal heart sounds.  ?Pulmonary:  ?   Effort: Pulmonary effort is normal. No respiratory distress.  ?   Breath sounds: Normal breath sounds.  ?Abdominal:  ?   General: Abdomen is flat. Bowel sounds are normal.  ?   Palpations: Abdomen is soft.  ?   Tenderness: There is no abdominal tenderness.  ?Neurological:  ?   Mental Status: She is alert and oriented to person, place, and time.  ?Psychiatric:     ?   Mood and Affect: Mood normal.     ?    Behavior: Behavior normal.  ? ? ?Diabetic Foot Exam - Simple   ?No data filed ?  ?  ? ?Lab Results  ?Component Value Date  ? WBC 6.7 10/20/2021  ? HGB 13.0 10/20/2021  ? HCT 39.4 10/20/2021  ? PLT 240 10/20/2021  ?

## 2021-10-21 LAB — COMPREHENSIVE METABOLIC PANEL
ALT: 20 IU/L (ref 0–32)
AST: 22 IU/L (ref 0–40)
Albumin/Globulin Ratio: 1.6 (ref 1.2–2.2)
Albumin: 4.2 g/dL (ref 3.7–4.7)
Alkaline Phosphatase: 48 IU/L (ref 44–121)
BUN/Creatinine Ratio: 19 (ref 12–28)
BUN: 21 mg/dL (ref 8–27)
Bilirubin Total: 0.3 mg/dL (ref 0.0–1.2)
CO2: 22 mmol/L (ref 20–29)
Calcium: 9.9 mg/dL (ref 8.7–10.3)
Chloride: 100 mmol/L (ref 96–106)
Creatinine, Ser: 1.11 mg/dL — ABNORMAL HIGH (ref 0.57–1.00)
Globulin, Total: 2.6 g/dL (ref 1.5–4.5)
Glucose: 88 mg/dL (ref 70–99)
Potassium: 4.6 mmol/L (ref 3.5–5.2)
Sodium: 136 mmol/L (ref 134–144)
Total Protein: 6.8 g/dL (ref 6.0–8.5)
eGFR: 52 mL/min/{1.73_m2} — ABNORMAL LOW (ref >59)

## 2021-10-21 LAB — CBC WITH DIFFERENTIAL/PLATELET
Basophils Absolute: 0.1 10*3/uL (ref 0.0–0.2)
Basos: 2 %
EOS (ABSOLUTE): 0.6 10*3/uL — ABNORMAL HIGH (ref 0.0–0.4)
Eos: 9 %
Hematocrit: 39.4 % (ref 34.0–46.6)
Hemoglobin: 13 g/dL (ref 11.1–15.9)
Immature Grans (Abs): 0 10*3/uL (ref 0.0–0.1)
Immature Granulocytes: 0 %
Lymphocytes Absolute: 2 10*3/uL (ref 0.7–3.1)
Lymphs: 29 %
MCH: 30.9 pg (ref 26.6–33.0)
MCHC: 33 g/dL (ref 31.5–35.7)
MCV: 94 fL (ref 79–97)
Monocytes Absolute: 0.6 10*3/uL (ref 0.1–0.9)
Monocytes: 10 %
Neutrophils Absolute: 3.4 10*3/uL (ref 1.4–7.0)
Neutrophils: 50 %
Platelets: 240 10*3/uL (ref 150–450)
RBC: 4.21 x10E6/uL (ref 3.77–5.28)
RDW: 12.9 % (ref 11.7–15.4)
WBC: 6.7 10*3/uL (ref 3.4–10.8)

## 2021-10-21 LAB — LIPID PANEL
Chol/HDL Ratio: 4.9 ratio — ABNORMAL HIGH (ref 0.0–4.4)
Cholesterol, Total: 284 mg/dL — ABNORMAL HIGH (ref 100–199)
HDL: 58 mg/dL (ref >39)
LDL Chol Calc (NIH): 202 mg/dL — ABNORMAL HIGH (ref 0–99)
Triglycerides: 134 mg/dL (ref 0–149)
VLDL Cholesterol Cal: 24 mg/dL (ref 5–40)

## 2021-10-21 LAB — HEMOGLOBIN A1C
Est. average glucose Bld gHb Est-mCnc: 120 mg/dL
Hgb A1c MFr Bld: 5.8 % — ABNORMAL HIGH (ref 4.8–5.6)

## 2021-10-21 LAB — VITAMIN D 25 HYDROXY (VIT D DEFICIENCY, FRACTURES): Vit D, 25-Hydroxy: 52.9 ng/mL (ref 30.0–100.0)

## 2021-10-21 NOTE — Progress Notes (Signed)
Blood count normal.  ?Liver function normal.  ?Kidney function improved. ?Cholesterol: LDL 202. Very high. Recommend nexlitol one twice daily. Can we check to see if we have any samples please?  ?HBA1C: improved from 6.3 to 5.8. ?Vitamin D level is normal.

## 2021-10-22 NOTE — Progress Notes (Signed)
Patient informed.  We do not have samples of the nexlitol at this time.  She will call us back at a later date to see if samples are available  since she is very sensitive to a lot of medications.   ?

## 2021-10-23 DIAGNOSIS — Z20822 Contact with and (suspected) exposure to covid-19: Secondary | ICD-10-CM | POA: Diagnosis not present

## 2021-10-24 ENCOUNTER — Encounter: Payer: Self-pay | Admitting: Family Medicine

## 2021-10-24 DIAGNOSIS — E559 Vitamin D deficiency, unspecified: Secondary | ICD-10-CM | POA: Insufficient documentation

## 2021-10-24 DIAGNOSIS — N1832 Chronic kidney disease, stage 3b: Secondary | ICD-10-CM | POA: Insufficient documentation

## 2021-10-24 DIAGNOSIS — I119 Hypertensive heart disease without heart failure: Secondary | ICD-10-CM | POA: Insufficient documentation

## 2021-10-24 LAB — URINE CULTURE

## 2021-10-24 NOTE — Assessment & Plan Note (Signed)
Recommend continue to work on eating healthy diet and exercise. ?Increase mounjaro to 10 mg daily.  ?

## 2021-10-24 NOTE — Assessment & Plan Note (Signed)
Intolerant to statins and repatha.  ?Recommend continue to work on eating healthy diet and exercise. ?Recommend nexlitol one oral twice daily.  ?.  ? ?

## 2021-10-24 NOTE — Assessment & Plan Note (Signed)
The current medical regimen is effective;  continue present plan and medications. ?Continue synthroid 88 mcg once daily.  ?

## 2021-10-24 NOTE — Assessment & Plan Note (Signed)
Plan unchanged from last visit.  ?Check lipids.  ?

## 2021-10-24 NOTE — Assessment & Plan Note (Signed)
Continue lisinopril 5 mg daily, metoprolol tartrate 50 mg twice daily, and lasix 20 mg daily.  ?

## 2021-10-24 NOTE — Assessment & Plan Note (Signed)
Check level 

## 2021-10-24 NOTE — Assessment & Plan Note (Signed)
Start on cipro 250 mg twice daily x 7 days.  ?

## 2021-10-24 NOTE — Assessment & Plan Note (Signed)
Improved.  ?Management per specialist.  ?Off inhalers.  ?

## 2021-10-24 NOTE — Assessment & Plan Note (Signed)
Check labs. Avoid nsaids.  ?

## 2021-10-25 ENCOUNTER — Other Ambulatory Visit: Payer: Self-pay

## 2021-10-25 MED ORDER — CIPROFLOXACIN HCL 500 MG PO TABS: 500.0000 mg | ORAL_TABLET | Freq: Two times a day (BID) | ORAL | 0 refills | Status: DC

## 2021-10-25 NOTE — Telephone Encounter (Signed)
Additional antibiotics sent to pharmacy.  Cipro 500 mg twice daily x 3 days.   ?

## 2021-10-28 ENCOUNTER — Encounter: Payer: Self-pay | Admitting: Family Medicine

## 2021-10-29 ENCOUNTER — Other Ambulatory Visit: Payer: Self-pay | Admitting: Family Medicine

## 2021-11-01 ENCOUNTER — Telehealth: Payer: Self-pay

## 2021-11-01 NOTE — Telephone Encounter (Signed)
Mounjarno denied. Will f/u next week with alternatives ?

## 2021-11-04 DIAGNOSIS — G894 Chronic pain syndrome: Secondary | ICD-10-CM | POA: Diagnosis not present

## 2021-11-04 DIAGNOSIS — M961 Postlaminectomy syndrome, not elsewhere classified: Secondary | ICD-10-CM | POA: Diagnosis not present

## 2021-11-04 DIAGNOSIS — M533 Sacrococcygeal disorders, not elsewhere classified: Secondary | ICD-10-CM | POA: Diagnosis not present

## 2021-11-08 ENCOUNTER — Ambulatory Visit (INDEPENDENT_AMBULATORY_CARE_PROVIDER_SITE_OTHER): Payer: Medicare Other

## 2021-11-08 DIAGNOSIS — R7303 Prediabetes: Secondary | ICD-10-CM

## 2021-11-08 DIAGNOSIS — E782 Mixed hyperlipidemia: Secondary | ICD-10-CM

## 2021-11-08 DIAGNOSIS — F331 Major depressive disorder, recurrent, moderate: Secondary | ICD-10-CM

## 2021-11-08 NOTE — Progress Notes (Signed)
Chronic Care Management Pharmacy Note  11/08/2021 Name:  Courtney [name] MRN:  [id number] DOB:  [birth year]  Summary:  Pleasant 76 year old female presents for f/u CCM visit. 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 She is under a lot of stress. Patient cares for husband and takes him to Dialysis 3x/week.   Recommendations:  Dexa scan (Last done in 2019) Patient in [facility] with medications but she states she is able to afford them   Subjective: Courtney [name] is an 76 y.o. year old female who is a primary patient of [name], Kirsten, MD.  The CCM team was consulted for assistance with disease management and care coordination needs.    Engaged with patient by telephone for follow up visit in response to provider referral for pharmacy case management and/or care coordination services.   Consent to Services:  The patient was given the following information about Chronic Care Management services today, agreed to services, and gave verbal consent: 1. CCM service includes personalized support from designated clinical staff supervised by the primary care provider, including individualized plan of care and coordination with other care providers 2. 24/7 contact phone numbers for assistance for urgent and routine care needs. 3. Service will only be billed when office clinical staff spend 20 minutes or more in a month to coordinate care. 4. Only one practitioner may furnish and bill the service in a calendar month. 5.The patient may stop CCM services at any time (effective at the end of the month) by phone call to the office staff. 6. The patient will be responsible for cost sharing (co-pay) of up to 20% of the service fee (after annual deductible is met). Patient agreed to services and consent obtained.  Patient Care Team: [name], MD as PCP - General (Family Medicine) Jettie Booze, MD as PCP - Cardiology (Cardiology) McGukin, Shade Flood., MD  (Cardiology) [name], MD as Consulting Physician (Pulmonary Disease) Lane Hacker, [facility] (Pharmacist)  Recent office visits:  09/20/21 [name] MD. Seen for Shoulder Pain. No med changes.   08/02/21 Jerrell Belfast NP. Seen for CKD. Stop Aspirin 325 mg twice daily. Avoid Ibuprofen.Take Tylenol as needed for pain.   Recent consult visits:  08/26/21 (Pain Clinic) Vicenta Aly. Seen for Failed back syndrome. Reordered Oxycodone-APAP 10-354m.    Hospital visits:  None   Objective:  Lab Results  Component Value Date   CREATININE 1.11 (H) 10/20/2021   BUN 21 10/20/2021   GFRNONAA 66 02/17/2020   GFRAA 76 02/17/2020   NA 136 10/20/2021   K 4.6 10/20/2021   CALCIUM 9.9 10/20/2021   CO2 22 10/20/2021   GLUCOSE 88 10/20/2021    Lab Results  Component Value Date/Time   HGBA1C 5.8 (H) 10/20/2021 10:01 AM   HGBA1C 6.3 (H) 07/19/2021 09:05 AM    Last diabetic Eye exam: No results found for: HMDIABEYEEXA  Last diabetic Foot exam: No results found for: HMDIABFOOTEX   Lab Results  Component Value Date   CHOL 284 (H) 10/20/2021   HDL 58 10/20/2021   LDLCALC 202 (H) 10/20/2021   TRIG 134 10/20/2021   CHOLHDL 4.9 (H) 10/20/2021       Latest Ref Rng & Units 10/20/2021   10:01 AM 08/02/2021   10:41 AM 07/21/2021    1:43 PM  Hepatic Function  Total Protein 6.0 - 8.5 g/dL 6.8   7.5   7.0  Albumin 3.7 - 4.7 g/dL 4.2   4.7   4.7    AST 0 - 40 IU/L _0 ALT 0 - 32 IU/L _1 Alk Phosphatase 44 - 121 IU/L 48   27   88    Total Bilirubin 0.0 - 1.2 mg/dL 0.3   0.2   0.3           Latest Ref Rng & Units 10/20/2021   10:01 AM 07/19/2021    9:05 AM 04/13/2021    9:08 AM  CBC  WBC 3.4 - 10.8 x10E3/uL 6.7   8.7   7.1    Hemoglobin 11.1 - 15.9 g/dL 13.0   11.8   12.4    Hematocrit 34.0 - 46.6 % 39.4   35.2   37.6    Platelets 150 - 450 x10E3/uL 240   226   223      Lab Results  Component Value Date/Time   VD25OH 52.9 10/20/2021 10:01 AM     Clinical ASCVD: No  The 10-year ASCVD risk score (Arnett DK, et al., 2019) is: 34.5%   Values used to calculate the score:     Age: 71 years     Sex: Female     Is Non-Hispanic African American: No     Diabetic: Yes     Tobacco smoker: No     Systolic Blood Pressure: 465 mmHg     Is BP treated: Yes     HDL Cholesterol: 58 mg/dL     Total Cholesterol: 284 mg/dL       11/08/2021    8:46 AM 07/19/2021    8:53 AM 07/19/2021    8:28 AM  Depression screen PHQ 2/9  Decreased Interest 1 3 0  Down, Depressed, Hopeless 1 1 0  PHQ - 2 Score 2 4 0  Altered sleeping 3 3   Tired, decreased energy 3 3   Change in appetite 0 0   Feeling bad or failure about yourself  0 1   Trouble concentrating 0 1   Moving slowly or fidgety/restless 0 0   Suicidal thoughts 0 0   PHQ-9 Score 8 12   Difficult doing work/chores Somewhat difficult Very difficult      Other: (CHADS2VASc if Afib, MMRC or CAT for COPD, ACT, DEXA)  Social History   Tobacco Use  Smoking Status Never  Smokeless Tobacco Never   BP Readings from Last 3 Encounters:  10/20/21 104/64  09/20/21 128/72  08/02/21 124/76   Pulse Readings from Last 3 Encounters:  10/20/21 100  09/20/21 83  08/02/21 70   Wt Readings from Last 3 Encounters:  10/20/21 163 lb (73.9 kg)  09/20/21 165 lb (74.8 kg)  08/02/21 172 lb (78 kg)   BMI Readings from Last 3 Encounters:  10/20/21 27.12 kg/m  09/20/21 27.46 kg/m  08/02/21 28.62 kg/m    Assessment/Interventions: Review of patient past medical history, allergies, medications, health status, including review of consultants reports, laboratory and other test data, was performed as part of comprehensive evaluation and provision of chronic care management services.   SDOH:  (Social Determinants of Health) assessments and interventions performed: Yes SDOH Interventions    Flowsheet Row Most Recent Value  SDOH Interventions   Financial Strain Interventions Patient Refused   Transportation Interventions Intervention Not Indicated  Depression Interventions/Treatment  Patient refuses Treatment      SDOH Screenings  Alcohol Screen: Low Risk    Last Alcohol Screening Score (AUDIT): 0  Depression (PHQ2-9): Medium Risk   PHQ-2 Score: 8  Financial Resource Strain: Medium Risk   Difficulty of Paying Living Expenses: Somewhat hard  Food Insecurity: No Food Insecurity   Worried About Charity fundraiser in the Last Year: Never true   Ran Out of Food in the Last Year: Never true  Housing: Low Risk    Last Housing Risk Score: 0  Physical Activity: Not on file  Social Connections: Not on file  Stress: Not on file  Tobacco Use: Low Risk    Smoking Tobacco Use: Never   Smokeless Tobacco Use: Never   Passive Exposure: Not on file  Transportation Needs: No Transportation Needs   Lack of Transportation (Medical): No   Lack of Transportation (Non-Medical): No    CCM Care Plan  Allergies  Allergen Reactions   Levofloxacin Hives, Shortness Of Breath and Swelling    RASH IN MOUTH   (can take IV route)   Bactrim [Sulfamethoxazole-Trimethoprim] Hives and Itching   Clarithromycin     GI upset Other reaction(s): GI Upset (intolerance) GI upset -can take but does cause mild upset    Lyrica [Pregabalin]    Macrobid [Nitrofurantoin]     Not tolerate   Repatha [Evolocumab]     Legs hurt.    Statins Other (See Comments)    Myalgias    Medications Reviewed Today     Reviewed by Lane Hacker, [facility] (Pharmacist) on 11/08/21 at [phone]  Med List Status: <None>   Medication Order Taking? Sig Documenting Provider Last Dose Status Informant  albuterol (VENTOLIN HFA) 108 (90 Base) MCG/ACT inhaler [id number] No Inhale into the lungs. [provider] Taking Active   amLODipine (NORVASC) 5 MG tablet [id number] No Take by mouth. [provider] Taking Active   aspirin 81 MG chewable tablet [id number] No Chew 81 mg by mouth daily.  Patient not taking:  Reported on 10/20/2021   [provider] Not Taking Active   budesonide-formoterol [facility]) 160-4.5 MCG/ACT inhaler [id number] No Inhale 2 puffs into the lungs in the morning and at bedtime.  Patient not taking: Reported on 10/20/2021   [name], MD Not Taking Active   cetirizine (ZYRTEC) 10 MG tablet [id number] No Take 1 tablet (10 mg total) by mouth daily. [name], MD Taking Active   Cholecalciferol (D3-50 PO) [id number] No Take by mouth. [provider] Taking Active   ciprofloxacin (CIPRO) 500 MG tablet [id number]  Take 1 tablet (500 mg total) by mouth 2 (two) times daily. [name], Kirsten, MD  Active   furosemide (LASIX) 20 MG tablet [id number] No TAKE 1 TABLET(20 MG) BY MOUTH DAILY [name], Kirsten, MD Taking Active   gabapentin (NEURONTIN) 600 MG tablet [id number] No Take 1 tablet (600 mg total) by mouth at bedtime. As needed [name], Elnita Maxwell, MD Taking Active   ipratropium (ATROVENT) 0.02 % nebulizer solution [id number] No Take 2.5 mLs (0.5 mg total) by nebulization 4 (four) times daily.  Patient not taking: Reported on 10/20/2021   CoxElnita Maxwell, MD Not Taking Active   levothyroxine (SYNTHROID) 88 MCG tablet [id number] No TAKE 1 TABLET(88 MCG) BY MOUTH DAILY BEFORE AND BREAKFAST [name], Kirsten, MD Taking Active   lisinopril (ZESTRIL) 5 MG tablet [id number] No Take 5 mg by mouth daily. [provider] Taking Active   metoprolol tartrate (LOPRESSOR) 50 MG tablet [id number]  TAKE 1 TABLET(50 MG) BY MOUTH TWICE DAILY WITH MEALS [name], Kirsten, MD  Active   Multiple Vitamins-Minerals (MULTIVITAMIN WITH MINERALS) tablet [id number] No Take 1 tablet by mouth daily. [provider] Taking Active   nitroGLYCERIN (NITROSTAT) 0.4 MG SL tablet [id number] No PLACE 1 TABLET UNDER THE TONGUE EVERY 5 MINUTES AS NEEDED FOR CHEST PAIN. [name], MD Taking Active   omeprazole (PRILOSEC) 40 MG capsule [id number]  TAKE 1 CAPSULE(40 MG) BY MOUTH TWICE DAILY Marge Duncans, PA-C  Active    oxyCODONE-acetaminophen (PERCOCET) 10-325 MG tablet [id number] No Take by mouth. [provider] Taking Active   tirzepatide [facility]) 10 MG/0.5ML Pen [id number]  Inject 10 mg into the skin once a week. [name], Kirsten, MD  Active   tiZANidine (ZANAFLEX) 4 MG tablet [id number] No Take by mouth. [provider] Taking Active   tretinoin (RETIN-A) 0.05 % cream [id number] No tretinoin 0.05 % topical cream [provider] Taking Active   triamcinolone acetonide (KENALOG-40) injection 40 mg [id number]   [name], MD  Active   vitamin E 180 MG (400 UNITS) capsule [id number] No Take 400 Units by mouth daily. 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 10/24/2021   Stage 3b chronic kidney disease ([place]) 10/24/2021   Vitamin D insufficiency 10/24/2021   Shoulder impingement syndrome, right 09/30/2021   Moderate recurrent major depression ([place]) 07/19/2021   Telogen effluvium 04/17/2021   Prediabetes 04/17/2021   Coronary artery disease involving native coronary artery of native heart without angina pectoris 04/17/2021   History of percutaneous coronary intervention 12/23/2020   Acute cystitis with hematuria 02/27/2020   Secondary hypothyroidism 10/06/2019   Chronic idiopathic constipation 10/06/2019   Non-seasonal allergic rhinitis due to pollen 10/06/2019   Mixed hyperlipidemia 10/02/2019   Supraventricular tachycardia ([place]) 10/02/2019   Myalgia due to statin 10/02/2019   Peripheral vascular disease ([place]) 09/11/2019   Bronchiectasis ([place]) 08/23/2019   Spinal stenosis of lumbar region with neurogenic claudication 09/03/2018   Family history of early CAD 04/09/2018   Spondylolisthesis of lumbar region 07/29/2015   Varicose veins of both lower extremities with pain 02/10/2015    Immunization History  Administered Date(s) Administered   Fluad Quad(high Dose  65+) 04/13/2021   Influenza, High Dose Seasonal PF 04/11/2017, 03/26/2018   Influenza,inj,Quad PF,6+ Mos 02/22/2014, 03/23/2015, 02/19/2019   Influenza-Unspecified 02/22/2014   Moderna Sars-Covid-2 Vaccination 07/22/2019, 08/12/2019   Pneumococcal Conjugate-13 02/16/2017   Pneumococcal Polysaccharide-23 04/07/2015   Zoster, Live 04/13/2006    Conditions to be addressed/monitored:  Hyperlipidemia, Hypothyroidism, Allergic Rhinitis and peripheral vascular disease.   Care Plan : [place]  Updates made by Lane Hacker, [facility] since 11/08/2021 12:00 AM     Problem: svt, hld, constipation, gerd, pain   Priority: High  Onset Date: 11/18/2020     Long-Range Goal: Disease State Management   Start Date: 11/18/2020  Expected End Date: 11/18/2021  Recent Progress: On track  Priority: High  Note:   Current Barriers:  Unable to achieve control of cholesterol   Pharmacist Clinical Goal(s):  Patient will achieve control of cholesterol  as evidenced by lipid panel through collaboration with PharmD and provider.   Interventions: 1:1 collaboration with [name], MD regarding development and update of comprehensive plan of care as evidenced by provider attestation and co-signature Inter-disciplinary care team collaboration (see longitudinal plan of care) Comprehensive medication review performed; medication list updated in electronic medical record  Hyperlipidemia: (LDL goal < 55) The 10-year ASCVD risk score (Arnett DK, et al., 2019) is: 34.5%   Values used to calculate the score:     Age: 11 years     Sex: Female     Is Non-Hispanic African American: No     Diabetic: Yes     Tobacco smoker: No     Systolic Blood Pressure: 818 mmHg     Is BP treated: Yes     HDL Cholesterol: 58 mg/dL     Total Cholesterol: 284 mg/dL Lab Results  Component Value Date   CHOL 284 (H) 10/20/2021   CHOL 268 (H) 07/19/2021   CHOL 267 (H) 04/13/2021   Lab Results  Component Value Date    HDL 58 10/20/2021   HDL 57 07/19/2021   HDL 46 04/13/2021   Lab Results  Component Value Date   LDLCALC 202 (H) 10/20/2021   LDLCALC 192 (H) 07/19/2021   LDLCALC 190 (H) 04/13/2021   Lab Results  Component Value Date   TRIG 134 10/20/2021   TRIG 110 07/19/2021   TRIG 164 (H) 04/13/2021   Lab Results  Component Value Date   CHOLHDL 4.9 (H) 10/20/2021   CHOLHDL 4.7 (H) 07/19/2021   CHOLHDL 5.8 (H) 04/13/2021  No results found for: LDLDIRECT -Uncontrolled -Current treatment:  N/A -Medications previously tried: Repatha - leg pain that discontinued once stoping medications, statins, zetia, Lipitor -Current dietary patterns: avoids breads and sweets. Wife tries to cook healthy. Meat and 2 vegetables is common.  -Current exercise habits: pool exercise, walk, works in garden  -Educated on Cholesterol goals;  Benefits of statin for ASCVD risk reduction; Importance of limiting foods high in cholesterol; Exercise goal of 150 minutes per week; Strategies to manage statin-induced myalgias; -Counseled on diet and exercise extensively May 2023: Patient would not like to add or change medications at this time  Diabetes (A1c goal <6.5%) -Controlled -Current medications: Mounjaro 51m Weekly Appropriate, Effective, Safe, Query accessible -Medications previously tried: Metformin (Dizzy but ate on empty stomach)  -Current home glucose readings fasting glucose: N/A post prandial glucose: N/A -Denies hypoglycemic/hyperglycemic symptoms -Current meal patterns:  breakfast: Theralight shake  lunch: Various  dinner: Baked Chicken, Cauliflower, half a baked Potato -Current exercise: Takes care of husband -Educated on A1c and blood sugar goals; -Counseled to check feet daily and get yearly eye exams May 2023: [facility] and would like to continue. Lost 28 pounds on it, currently is 154 pounds and would like to get to  145.  Cardio (Goal: manage heart rate) -Controlled -Current treatment  Lisinopril 583mAppropriate, Effective, Safe, Accessible Metoprolol tartrate 50 mg bid with meals Appropriate, Effective, Safe, Accessible Amlodipine 86m27mppropriate, Effective, Safe, Accessible Aspirin ec 81 mg daily Appropriate, Effective, Safe, Accessible -Medications previously tried: none reported -Recommended to continue current medication. Patient reports symptoms are controlled.   Allergic Rhinitis (Goal: manage symptoms ) -Controlled -Current treatment  Montelukast 10 mg at bedtime Appropriate, Effective, Safe, Accessible flonase nasal spray 2 sprays daily prn allergies Appropriate, Effective, Safe, Accessible -Medications previously tried: none reporte  -Recommended to continue current medication  Chronic Constipation (Goal: manage symptoms of constipation) -Controlled -Current treatment  docusate 100 mg PRN Appropriate, Effective, Safe, Accessible -Medications previously tried: none reported  -Counseled on diet and exercise extensively Recommended to continue current medication. Patient reports eating a variety of vegetables in diet.   Back Pain  (Goal: manage symptoms of  back pain ) -Controlled -Current treatment  Oxycodone-acetaminophen 10/325 mg tid prn Appropriate, Query effective, Safe, Accessible Gabapentin 667m Appropriate, Query effective, Safe, Accessible Tizanidine 4 mg daily at bedtime prn Appropriate, Query effective, Safe, Accessible -Medications previously tried: none reported  -Recommended to continue current medication May 2023: Patient is not content on pain therapy, she wasn't that open to speaking about it so didn't get pain score. She will call her specialist as soon as she hangs up with me  Bone Health (Goal keep bones healthy and strong) -Controlled -Last DEXA Scan:  07/28/2017  T-Score femoral neck: -0.5  T-Score forearm radius: -1.6  10-year probability of major  osteoporotic fracture: 8%  10-year probability of hip fracture: .7% -Patient is not a candidate for pharmacologic treatment -Current treatment  Diet and lifestyle -Medications previously tried: n/a  -Recommend [id number] units of vitamin D daily. Recommend 1200 mg of calcium daily from dietary and supplemental sources. Recommend weight-bearing and muscle strengthening exercises for building and maintaining bone density. May 2023: Due for Dexa  Hypothyroidism (Goal: manage TSH) Lab Results  Component Value Date   TSH 3.160 07/19/2021  -Controlled -Current treatment  Levothyroxine 88 mcg daily Appropriate, Effective, Safe, Accessible -Medications previously tried: none reported  -Recommended to continue current medication  GERD (Goal: manage symptoms) -Controlled -Current treatment  omeprazole 40 mg daily prn Appropriate, Effective, Safe, Accessible -Medications previously tried: none reported  -Counseled on diet and exercise extensively -Recommended to continue current medication (PRN)  Leg Swelling (Goal: manage symptoms of leg swelling ) -Not ideally controlled -Current treatment  Furosemide 20 mg daily Appropriate, Effective, Safe, Accessible -Medications previously tried: none reported  -Counseled on diet and exercise extensively Recommended to continue current medication  Depression/Anxiety (Goal: improve symptoms) -Uncontrolled -Current treatment: None -Medications previously tried/failed: Bupropion (Made her feel "crazy"), Duloxetine (Patient unsure why stopped -PHQ9:     11/08/2021    8:46 AM 07/19/2021    8:53 AM 07/19/2021    8:28 AM  Depression screen PHQ 2/9  Decreased Interest 1 3 0  Down, Depressed, Hopeless 1 1 0  PHQ - 2 Score 2 4 0  Altered sleeping 3 3   Tired, decreased energy 3 3   Change in appetite 0 0   Feeling bad or failure about yourself  0 1   Trouble concentrating 0 1   Moving slowly or fidgety/restless 0 0   Suicidal thoughts 0 0   PHQ-9  Score 8 12   Difficult doing work/chores Somewhat difficult Very difficult   -GAD7:      View : No data to display.        -Educated on Benefits of medication for symptom control May 2023: Patient would like to hold off on therapy at this time. She doesn't want to add any medications or "Further damage her kidney"    Patient Goals/Self-Care Activities Patient will:  - take medications as prescribed focus on medication adherence by using pill box target a minimum of 150 minutes of moderate intensity exercise weekly engage in dietary modifications by limiting fried/fatty foods and continuing to eat lean protein and vegetables.   Follow Up Plan: Telephone follow up appointment with care management team member scheduled for: 02/2022  NArizona Constable Pharm.D. - [phone]       Medication Assistance: None required.  Patient affirms current coverage meets needs.  Compliance/Adherence/Medication fill history: Care Gaps: Last annual wellness visit? None noted  Colonscopy: 12/01/16 Dexa Scan: 27/8/29If applicable:N/A Last eye exam / retinopathy screening?  Diabetic foot exam?  Patient's preferred pharmacy is:  [id number] - [place] [address], [place] [address] [place] [address] Phone: [phone] Fax: [phone]  [facility] [place], [place] [address] [place] [address] Phone: [phone] Fax: [phone]  Uses pill box? Yes Pt endorses good compliance  We discussed: Benefits of medication synchronization, packaging and delivery as well as enhanced pharmacist oversight with [employer]. Patient decided to: Continue current medication management strategy  Care Plan and Follow Up Patient Decision:  Patient agrees to Care Plan and Follow-up.  Plan: Telephone follow up appointment with care management team  member scheduled for:  02/2022  [place] Constable, Pharm.D. - [phone]

## 2021-11-08 NOTE — Patient Instructions (Signed)
Visit Information   Goals Addressed   None    Patient Care Plan: CCM Pharmacy Care Plan     Problem Identified: svt, hld, constipation, gerd, pain   Priority: High  Onset Date: 11/18/2020     Long-Range Goal: Disease State Management   Start Date: 11/18/2020  Expected End Date: 11/18/2021  Recent Progress: On track  Priority: High  Note:   Current Barriers:  Unable to achieve control of cholesterol   Pharmacist Clinical Goal(s):  Patient will achieve control of cholesterol  as evidenced by lipid panel through collaboration with PharmD and provider.   Interventions: 1:1 collaboration with [name], Elnita Maxwell, MD regarding development and update of comprehensive plan of care as evidenced by provider attestation and co-signature Inter-disciplinary care team collaboration (see longitudinal plan of care) Comprehensive medication review performed; medication list updated in electronic medical record  Hyperlipidemia: (LDL goal < 55) The 10-year ASCVD risk score (Arnett DK, et al., 2019) is: 34.5%   Values used to calculate the score:     Age: 76 years     Sex: Female     Is Non-Hispanic African American: No     Diabetic: Yes     Tobacco smoker: No     Systolic Blood Pressure: 007 mmHg     Is BP treated: Yes     HDL Cholesterol: 58 mg/dL     Total Cholesterol: 284 mg/dL Lab Results  Component Value Date   CHOL 284 (H) 10/20/2021   CHOL 268 (H) 07/19/2021   CHOL 267 (H) 04/13/2021   Lab Results  Component Value Date   HDL 58 10/20/2021   HDL 57 07/19/2021   HDL 46 04/13/2021   Lab Results  Component Value Date   LDLCALC 202 (H) 10/20/2021   LDLCALC 192 (H) 07/19/2021   Palmer 190 (H) 04/13/2021   Lab Results  Component Value Date   TRIG 134 10/20/2021   TRIG 110 07/19/2021   TRIG 164 (H) 04/13/2021   Lab Results  Component Value Date   CHOLHDL 4.9 (H) 10/20/2021   CHOLHDL 4.7 (H) 07/19/2021   CHOLHDL 5.8 (H) 04/13/2021  No results found for:  LDLDIRECT -Uncontrolled -Current treatment:  N/A -Medications previously tried: Repatha - leg pain that discontinued once stoping medications, statins, zetia, Lipitor -Current dietary patterns: avoids breads and sweets. Wife tries to cook healthy. Meat and 2 vegetables is common.  -Current exercise habits: pool exercise, walk, works in garden  -Educated on Cholesterol goals;  Benefits of statin for ASCVD risk reduction; Importance of limiting foods high in cholesterol; Exercise goal of 150 minutes per week; Strategies to manage statin-induced myalgias; -Counseled on diet and exercise extensively May 2023: Patient would not like to add or change medications at this time  Diabetes (A1c goal <6.5%) -Controlled -Current medications: Mounjaro '10mg'$  Weekly Appropriate, Effective, Safe, Query accessible -Medications previously tried: Metformin (Dizzy but ate on empty stomach)  -Current home glucose readings fasting glucose: N/A post prandial glucose: N/A -Denies hypoglycemic/hyperglycemic symptoms -Current meal patterns:  breakfast: Theralight shake  lunch: Various  dinner: Baked Chicken, Cauliflower, half a baked Potato -Current exercise: Takes care of husband -Educated on A1c and blood sugar goals; -Counseled to check feet daily and get yearly eye exams May 2023: Patient in [facility], explained alternatives to [facility] but patient is able to pay $200 and would like to continue. Lost 28 pounds on it, currently is 154 pounds and would like to get to 145.  Cardio (Goal: manage heart rate) -Controlled -Current treatment  Lisinopril '5mg'$  Appropriate, Effective, Safe, Accessible Metoprolol tartrate 50 mg bid with meals Appropriate, Effective, Safe, Accessible Amlodipine '5mg'$  Appropriate, Effective, Safe, Accessible Aspirin ec 81 mg daily Appropriate, Effective, Safe, Accessible -Medications previously tried: none reported -Recommended to continue current medication. Patient reports symptoms are  controlled.   Allergic Rhinitis (Goal: manage symptoms ) -Controlled -Current treatment  Montelukast 10 mg at bedtime Appropriate, Effective, Safe, Accessible flonase nasal spray 2 sprays daily prn allergies Appropriate, Effective, Safe, Accessible -Medications previously tried: none reporte  -Recommended to continue current medication  Chronic Constipation (Goal: manage symptoms of constipation) -Controlled -Current treatment  docusate 100 mg PRN Appropriate, Effective, Safe, Accessible -Medications previously tried: none reported  -Counseled on diet and exercise extensively Recommended to continue current medication. Patient reports eating a variety of vegetables in diet.   Back Pain  (Goal: manage symptoms of back pain ) -Controlled -Current treatment  Oxycodone-acetaminophen 10/325 mg tid prn Appropriate, Query effective, Safe, Accessible Gabapentin '600mg'$  Appropriate, Query effective, Safe, Accessible Tizanidine 4 mg daily at bedtime prn Appropriate, Query effective, Safe, Accessible -Medications previously tried: none reported  -Recommended to continue current medication May 2023: Patient is not content on pain therapy, she wasn't that open to speaking about it so didn't get pain score. She will call her specialist as soon as she hangs up with me  Bone Health (Goal keep bones healthy and strong) -Controlled -Last DEXA Scan:  07/28/2017  T-Score femoral neck: -0.5  T-Score forearm radius: -1.6  10-year probability of major osteoporotic fracture: 8%  10-year probability of hip fracture: .7% -Patient is not a candidate for pharmacologic treatment -Current treatment  Diet and lifestyle -Medications previously tried: n/a  -Recommend [id number] units of vitamin D daily. Recommend 1200 mg of calcium daily from dietary and supplemental sources. Recommend weight-bearing and muscle strengthening exercises for building and maintaining bone density. May 2023: Due for  Dexa  Hypothyroidism (Goal: manage TSH) Lab Results  Component Value Date   TSH 3.160 07/19/2021  -Controlled -Current treatment  Levothyroxine 88 mcg daily Appropriate, Effective, Safe, Accessible -Medications previously tried: none reported  -Recommended to continue current medication  GERD (Goal: manage symptoms) -Controlled -Current treatment  omeprazole 40 mg daily prn Appropriate, Effective, Safe, Accessible -Medications previously tried: none reported  -Counseled on diet and exercise extensively -Recommended to continue current medication (PRN)  Leg Swelling (Goal: manage symptoms of leg swelling ) -Not ideally controlled -Current treatment  Furosemide 20 mg daily Appropriate, Effective, Safe, Accessible -Medications previously tried: none reported  -Counseled on diet and exercise extensively Recommended to continue current medication  Depression/Anxiety (Goal: improve symptoms) -Uncontrolled -Current treatment: None -Medications previously tried/failed: Bupropion (Made her feel "crazy"), Duloxetine (Patient unsure why stopped -PHQ9:     11/08/2021    8:46 AM 07/19/2021    8:53 AM 07/19/2021    8:28 AM  Depression screen PHQ 2/9  Decreased Interest 1 3 0  Down, Depressed, Hopeless 1 1 0  PHQ - 2 Score 2 4 0  Altered sleeping 3 3   Tired, decreased energy 3 3   Change in appetite 0 0   Feeling bad or failure about yourself  0 1   Trouble concentrating 0 1   Moving slowly or fidgety/restless 0 0   Suicidal thoughts 0 0   PHQ-9 Score 8 12   Difficult doing work/chores Somewhat difficult Very difficult   -GAD7:      View : No data to display.        -Educated on  Benefits of medication for symptom control May 2023: Patient would like to hold off on therapy at this time. She doesn't want to add any medications or "Further damage her kidney"    Patient Goals/Self-Care Activities Patient will:  - take medications as prescribed focus on medication  adherence by using pill box target a minimum of 150 minutes of moderate intensity exercise weekly engage in dietary modifications by limiting fried/fatty foods and continuing to eat lean protein and vegetables.   Follow Up Plan: Telephone follow up appointment with care management team member scheduled for: 02/2022  [place] Constable, Pharm.D. - [phone]      Courtney [name] was given information about Chronic Care Management services today including:  CCM service includes personalized support from designated clinical staff supervised by her physician, including individualized plan of care and coordination with other care providers 24/7 contact phone numbers for assistance for urgent and routine care needs. Standard insurance, coinsurance, copays and deductibles apply for chronic care management only during months in which we provide at least 20 minutes of these services. Most insurances cover these services at 100%, however patients may be responsible for any copay, coinsurance and/or deductible if applicable. This service may help you avoid the need for more expensive face-to-face services. Only one practitioner may furnish and bill the service in a calendar month. The patient may stop CCM services at any time (effective at the end of the month) by phone call to the office staff.  Patient agreed to services and verbal consent obtained.   The patient verbalized understanding of instructions, educational materials, and care plan provided today and DECLINED offer to receive copy of patient instructions, educational materials, and care plan.  The pharmacy team will reach out to the patient again over the next 60 days.   Lane Hacker, [place]

## 2021-11-09 ENCOUNTER — Other Ambulatory Visit: Payer: Self-pay | Admitting: Family Medicine

## 2021-11-11 ENCOUNTER — Ambulatory Visit (INDEPENDENT_AMBULATORY_CARE_PROVIDER_SITE_OTHER): Payer: Medicare Other | Admitting: Legal Medicine

## 2021-11-11 ENCOUNTER — Encounter: Payer: Self-pay | Admitting: Legal Medicine

## 2021-11-11 VITALS — BP 102/62 | HR 98 | Temp 97.3°F | Ht 65.0 in | Wt 159.0 lb

## 2021-11-11 DIAGNOSIS — K5732 Diverticulitis of large intestine without perforation or abscess without bleeding: Secondary | ICD-10-CM

## 2021-11-11 MED ORDER — CIPROFLOXACIN HCL 500 MG PO TABS: 500.0000 mg | ORAL_TABLET | Freq: Two times a day (BID) | ORAL | 0 refills | Status: AC

## 2021-11-11 MED ORDER — PB-HYOSCY-ATROPINE-SCOPOLAMINE 16.2 MG PO TABS: 1.0000 | ORAL_TABLET | ORAL | 1 refills | Status: DC | PRN

## 2021-11-11 MED ORDER — KETOROLAC TROMETHAMINE 60 MG/2ML IM SOLN
60.0000 mg | Freq: Once | INTRAMUSCULAR | Status: AC
  Administered 2021-11-11: 60 mg via INTRAMUSCULAR

## 2021-11-11 NOTE — Progress Notes (Signed)
Acute Office Visit  Subjective:    Patient ID: Courtney [name], female    DOB: [birth year], 76 y.o.   MRN: [id number]  Chief Complaint  Patient presents with   Lower abdominal pain    HPI: acute Patient is in today for lower abdominal pain which started last night. Patient states she ate a salad yesterday and their may have been something in it to cause the pain. Patient believes she has diverticulitis, which she does have a history of this. She has a history of diverticulitis.  She is having pain. No vomiting.  Bends her over.  Past Medical History:  Diagnosis Date   Anemia    Arthritis    Bronchiectasis ([place])    CAP (community acquired pneumonia)  vs Eosinophilic Pna 12/26/381   Followed in Pulmonary clinic/ [facility]/ [place]    Cardiac dysrhythmia    Chronic idiopathic constipation    Colitis, acute 02/08/2021   Depression, major, recurrent, moderate (HCC)    Headache    Heart murmur    History of bladder infections    History of COVID-19    Hyperlipidemia    Hypertension    Hypothyroidism    Knee pain, bilateral    Melena 02/08/2021   Osteoarthritis    Pneumonia    Primary insomnia    Recovering alcoholic (HCC)    SVT (supraventricular tachycardia) (HCC)    UTI (urinary tract infection)    Varicose veins    Vitamin B12 deficiency     Past Surgical History:  Procedure Laterality Date   ABDOMINAL HYSTERECTOMY  1991   ANGIOPLASTY     at least 15 years ago, pt. denies    APPENDECTOMY     AUGMENTATION MAMMAPLASTY Bilateral    BACK SURGERY     had 2 surgeries. Have rods placed in back    BARIATRIC SURGERY     lap band-at least 14 years ago   BREAST BIOPSY Left    x2   BREAST ENHANCEMENT SURGERY  2006   CHOLECYSTECTOMY  12/2019   removed lap band.    COLONOSCOPY  12/01/2016   Moderate predominantly sigmod diverticulosis. Otherwise grossly normal colonoscopy   EYE SURGERY     IOL- bilateral - [place]   KNEE ARTHROSCOPY Left    lap band surgery  1981    LUMBAR FUSION  07/29/2015   posterior level one   removal of cervical disc fragments  10/2007   pt. denies     Family History  Problem Relation Age of Onset   Heart disease Mother    Heart disease Father    Diabetes Sister    Other Sister        BRAIN TUMOR   Heart disease Brother    Heart disease Brother    Heart disease Brother    Heart disease Brother    Heart disease Brother    Colon cancer Maternal Aunt    Colon cancer Cousin     Social History   Socioeconomic History   Marital status: Married    Spouse name: Not on file   Number of children: 2   Years of education: Not on file   Highest education level: Not on file  Occupational History   Occupation: retired    Comment: Marine scientist  Tobacco Use   Smoking status: Never   Smokeless tobacco: Never  Vaping Use   Vaping Use: Never used  Substance and Sexual Activity   Alcohol use: No    Alcohol/week: 0.0  standard drinks    Comment: recovery x 20 yrs   Drug use: No   Sexual activity: Not Currently    Comment: MARRIED  Other Topics Concern   Not on file  Social History Narrative   Not on file   Social Determinants of Health   Financial Resource Strain: Medium Risk   Difficulty of Paying Living Expenses: Somewhat hard  Food Insecurity: No Food Insecurity   Worried About Running Out of Food in the Last Year: Never true   Ran Out of Food in the Last Year: Never true  Transportation Needs: No Transportation Needs   Lack of Transportation (Medical): No   Lack of Transportation (Non-Medical): No  Physical Activity: Not on file  Stress: Not on file  Social Connections: Not on file  Intimate Partner Violence: Not on file    Outpatient Medications Prior to Visit  Medication Sig Dispense Refill   albuterol (VENTOLIN HFA) 108 (90 Base) MCG/ACT inhaler Inhale into the lungs.     amLODipine (NORVASC) 5 MG tablet Take by mouth.     aspirin 81 MG chewable tablet Chew 81 mg by mouth daily.     budesonide-formoterol  (SYMBICORT) 160-4.5 MCG/ACT inhaler Inhale 2 puffs into the lungs in the morning and at bedtime. 1 each 6   cetirizine (ZYRTEC) 10 MG tablet Take 1 tablet (10 mg total) by mouth daily. 90 tablet 3   Cholecalciferol (D3-50 PO) Take by mouth.     estradiol (ESTRACE) 0.1 MG/GM vaginal cream Place vaginally.     furosemide (LASIX) 20 MG tablet TAKE 1 TABLET(20 MG) BY MOUTH DAILY 90 tablet 0   gabapentin (NEURONTIN) 600 MG tablet Take 1 tablet (600 mg total) by mouth at bedtime. As needed 90 tablet 1   ipratropium (ATROVENT) 0.02 % nebulizer solution Take 2.5 mLs (0.5 mg total) by nebulization 4 (four) times daily. 100 mL 12   levothyroxine (SYNTHROID) 88 MCG tablet TAKE 1 TABLET(88 MCG) BY MOUTH DAILY BEFORE AND BREAKFAST 90 tablet 2   lisinopril (ZESTRIL) 5 MG tablet Take 5 mg by mouth daily.     metoprolol tartrate (LOPRESSOR) 50 MG tablet TAKE 1 TABLET(50 MG) BY MOUTH TWICE DAILY WITH MEALS 180 tablet 1   Multiple Vitamins-Minerals (MULTIVITAMIN WITH MINERALS) tablet Take 1 tablet by mouth daily.     nitroGLYCERIN (NITROSTAT) 0.4 MG SL tablet PLACE 1 TABLET UNDER THE TONGUE EVERY 5 MINUTES AS NEEDED FOR CHEST PAIN. 25 tablet 0   omeprazole (PRILOSEC) 40 MG capsule TAKE 1 CAPSULE(40 MG) BY MOUTH TWICE DAILY 180 capsule 0   oxyCODONE-acetaminophen (PERCOCET) 10-325 MG tablet Take by mouth.     tirzepatide (MOUNJARO) 10 MG/0.5ML Pen Inject 10 mg into the skin once a week. 6 mL 0   tiZANidine (ZANAFLEX) 4 MG tablet Take by mouth.     tretinoin (RETIN-A) 0.05 % cream tretinoin 0.05 % topical cream     vitamin E 180 MG (400 UNITS) capsule Take 400 Units by mouth daily.     ciprofloxacin (CIPRO) 500 MG tablet Take 1 tablet (500 mg total) by mouth 2 (two) times daily. 6 tablet 0   Facility-Administered Medications Prior to Visit  Medication Dose Route Frequency Provider Last Rate Last Admin   triamcinolone acetonide (KENALOG-40) injection 40 mg  40 mg Intra-articular Once [name], Kirsten, MD         Allergies  Allergen Reactions   Levofloxacin Hives, Shortness Of Breath and Swelling    RASH IN MOUTH   (can take IV  route)   Bactrim [Sulfamethoxazole-Trimethoprim] Hives and Itching   Clarithromycin     GI upset Other reaction(s): GI Upset (intolerance) GI upset -can take but does cause mild upset    Lyrica [Pregabalin]    Macrobid [Nitrofurantoin]     Not tolerate   Repatha [Evolocumab]     Legs hurt.    Statins Other (See Comments)    Myalgias    Review of Systems  Constitutional:  Negative for appetite change, fatigue and fever.  HENT:  Negative for congestion, ear pain, sinus pressure and sore throat.   Eyes:  Negative for visual disturbance.  Respiratory:  Negative for cough, chest tightness, shortness of breath and wheezing.   Cardiovascular:  Negative for chest pain and palpitations.  Gastrointestinal:  Positive for abdominal pain (Lower abdominal pain). Negative for constipation, diarrhea, nausea and vomiting.  Genitourinary:  Negative for dysuria and hematuria.  Musculoskeletal:  Negative for arthralgias, back pain, joint swelling and myalgias.  Skin:  Negative for rash.  Neurological:  Negative for dizziness, weakness and headaches.  Psychiatric/Behavioral:  Negative for dysphoric mood. The patient is not nervous/anxious.       Objective:    Physical Exam Vitals reviewed.  Constitutional:      Appearance: Normal appearance.  HENT:     Head: Normocephalic and atraumatic.     Right Ear: Tympanic membrane normal.     Left Ear: Tympanic membrane normal.     Mouth/Throat:     Mouth: Mucous membranes are moist.     Pharynx: Oropharynx is clear.  Eyes:     Extraocular Movements: Extraocular movements intact.     Conjunctiva/sclera: Conjunctivae normal.     Pupils: Pupils are equal, round, and reactive to light.  Cardiovascular:     Rate and Rhythm: Normal rate. Rhythm irregular.     Pulses: Normal pulses.     Heart sounds: Normal heart sounds. No murmur  heard.   No gallop.  Pulmonary:     Effort: Pulmonary effort is normal. No respiratory distress.     Breath sounds: Normal breath sounds. No wheezing.  Abdominal:     General: Abdomen is flat.     Palpations: Abdomen is soft.     Tenderness: There is abdominal tenderness (llq).  Skin:    Capillary Refill: Capillary refill takes less than 2 seconds.  Neurological:     General: No focal deficit present.     Mental Status: She is alert and oriented to person, place, and time. Mental status is at baseline.    BP 102/62 (BP Location: Left Arm, Patient Position: Sitting)   Pulse 98   Temp (!) 97.3 F (36.3 C) (Temporal)   Ht _0  (1.651 m)   Wt 159 lb (72.1 kg)   SpO2 97%   BMI 26.46 kg/m  Wt Readings from Last 3 Encounters:  11/11/21 159 lb (72.1 kg)  10/20/21 163 lb (73.9 kg)  09/20/21 165 lb (74.8 kg)    Health Maintenance Due  Topic Date Due   TETANUS/TDAP  Never done   Zoster Vaccines- Shingrix (1 of 2) Never done   COVID-19 Vaccine (3 - Mixed Product risk series) 09/09/2019    There are no preventive care reminders to display for this patient.   Lab Results  Component Value Date   TSH 3.160 07/19/2021   Lab Results  Component Value Date   WBC 6.7 10/20/2021   HGB 13.0 10/20/2021   HCT 39.4 10/20/2021   MCV 94 10/20/2021  PLT 240 10/20/2021   Lab Results  Component Value Date   NA 136 10/20/2021   K 4.6 10/20/2021   CO2 22 10/20/2021   GLUCOSE 88 10/20/2021   BUN 21 10/20/2021   CREATININE 1.11 (H) 10/20/2021   BILITOT 0.3 10/20/2021   ALKPHOS 48 10/20/2021   AST 22 10/20/2021   ALT 20 10/20/2021   PROT 6.8 10/20/2021   ALBUMIN 4.2 10/20/2021   CALCIUM 9.9 10/20/2021   ANIONGAP 9 09/04/2018   EGFR 52 (L) 10/20/2021   Lab Results  Component Value Date   CHOL 284 (H) 10/20/2021   Lab Results  Component Value Date   HDL 58 10/20/2021   Lab Results  Component Value Date   LDLCALC 202 (H) 10/20/2021   Lab Results  Component Value Date    TRIG 134 10/20/2021   Lab Results  Component Value Date   CHOLHDL 4.9 (H) 10/20/2021   Lab Results  Component Value Date   HGBA1C 5.8 (H) 10/20/2021         Assessment & Plan:   Diagnoses and all orders for this visit: Diverticulitis of large intestine without perforation or abscess without bleeding -     ciprofloxacin (CIPRO) 500 MG tablet; Take 1 tablet (500 mg total) by mouth 2 (two) times daily for 10 days. -     belladona alk-PHENObarbital (DONNATAL) 16.2 MG tablet; Take 1 tablet by mouth as needed. -     ketorolac (TORADOL) injection 60 mg Patient has acute diverticulitis with pain in the left lower quadrant she was given Toradol injection for the pain and started on ciprofloxacin as well as some [place] with cramping.  If she starts vomiting or getting worse she may need to go to the emergency room to be checked for possible bowel obstruction.    Meds ordered this encounter  Medications   ciprofloxacin (CIPRO) 500 MG tablet    Sig: Take 1 tablet (500 mg total) by mouth 2 (two) times daily for 10 days.    Dispense:  20 tablet    Refill:  0   belladona alk-PHENObarbital (DONNATAL) 16.2 MG tablet    Sig: Take 1 tablet by mouth as needed.    Dispense:  60 tablet    Refill:  1   ketorolac (TORADOL) injection 60 mg    I,Lauren M Auman,acting as a scribe for [employer], MD.,have documented all relevant documentation on the behalf of [name], MD,as directed by  [name], MD while in the presence of [name], MD.   [name], MD

## 2021-11-17 DIAGNOSIS — F32A Depression, unspecified: Secondary | ICD-10-CM

## 2021-11-17 DIAGNOSIS — E1169 Type 2 diabetes mellitus with other specified complication: Secondary | ICD-10-CM

## 2021-11-17 DIAGNOSIS — Z7984 Long term (current) use of oral hypoglycemic drugs: Secondary | ICD-10-CM

## 2021-11-17 DIAGNOSIS — E785 Hyperlipidemia, unspecified: Secondary | ICD-10-CM | POA: Diagnosis not present

## 2021-11-17 DIAGNOSIS — E039 Hypothyroidism, unspecified: Secondary | ICD-10-CM

## 2021-11-17 DIAGNOSIS — Z7985 Long-term (current) use of injectable non-insulin antidiabetic drugs: Secondary | ICD-10-CM

## 2021-11-17 MED ORDER — PB-HYOSCY-ATROPINE-SCOPOLAMINE 16.2 MG PO TABS: 1.0000 | ORAL_TABLET | Freq: Three times a day (TID) | ORAL | 1 refills | Status: DC | PRN

## 2021-11-17 NOTE — Addendum Note (Signed)
Addended by: [name] on: 11/17/2021 05:10 PM   Modules accepted: Orders

## 2021-11-29 ENCOUNTER — Other Ambulatory Visit: Payer: Self-pay | Admitting: Physician Assistant

## 2021-11-29 DIAGNOSIS — G8929 Other chronic pain: Secondary | ICD-10-CM | POA: Diagnosis not present

## 2021-11-29 DIAGNOSIS — M533 Sacrococcygeal disorders, not elsewhere classified: Secondary | ICD-10-CM | POA: Diagnosis not present

## 2021-11-29 DIAGNOSIS — M7918 Myalgia, other site: Secondary | ICD-10-CM | POA: Diagnosis not present

## 2021-12-02 ENCOUNTER — Encounter: Payer: Self-pay | Admitting: Nurse Practitioner

## 2021-12-02 ENCOUNTER — Ambulatory Visit (INDEPENDENT_AMBULATORY_CARE_PROVIDER_SITE_OTHER): Payer: Medicare Other | Admitting: Nurse Practitioner

## 2021-12-02 VITALS — BP 122/68 | HR 80 | Temp 97.3°F | Ht 65.0 in | Wt 156.0 lb

## 2021-12-02 DIAGNOSIS — N3000 Acute cystitis without hematuria: Secondary | ICD-10-CM

## 2021-12-02 DIAGNOSIS — R109 Unspecified abdominal pain: Secondary | ICD-10-CM | POA: Diagnosis not present

## 2021-12-02 DIAGNOSIS — N3001 Acute cystitis with hematuria: Secondary | ICD-10-CM | POA: Diagnosis not present

## 2021-12-02 DIAGNOSIS — N3289 Other specified disorders of bladder: Secondary | ICD-10-CM

## 2021-12-02 LAB — POCT URINALYSIS DIP (CLINITEK)
Blood, UA: NEGATIVE
Glucose, UA: NEGATIVE mg/dL
Ketones, POC UA: NEGATIVE mg/dL
Nitrite, UA: POSITIVE — AB
Spec Grav, UA: 1.015 (ref 1.010–1.025)
Urobilinogen, UA: 4 E.U./dL — AB
pH, UA: 5.5 (ref 5.0–8.0)

## 2021-12-02 MED ORDER — CEFTRIAXONE SODIUM 1 G IJ SOLR
1.0000 g | Freq: Once | INTRAMUSCULAR | Status: AC
  Administered 2021-12-02: 1 g via INTRAMUSCULAR

## 2021-12-02 MED ORDER — PHENAZOPYRIDINE HCL 100 MG PO TABS: 100.0000 mg | ORAL_TABLET | Freq: Three times a day (TID) | ORAL | 0 refills | Status: DC | PRN

## 2021-12-02 MED ORDER — CIPROFLOXACIN HCL 500 MG PO TABS: 500.0000 mg | ORAL_TABLET | Freq: Two times a day (BID) | ORAL | 0 refills | Status: DC

## 2021-12-02 NOTE — Patient Instructions (Signed)
Rocephin given in office Push fluids, especially water Seek emergency medical care for severe or worsening symptoms We will call you with culture results Take Pyridium as needed for bladder spasms Follow-up as needed   Urinary Tract Infection, Adult A urinary tract infection (UTI) is an infection of any part of the urinary tract. The urinary tract includes: The kidneys. The ureters. The bladder. The urethra. These organs make, store, and get rid of pee (urine) in the body. What are the causes? This infection is caused by germs (bacteria) in your genital area. These germs grow and cause swelling (inflammation) of your urinary tract. What increases the risk? The following factors may make you more likely to develop this condition: Using a small, thin tube (catheter) to drain pee. Not being able to control when you pee or poop (incontinence). Being female. If you are female, these things can increase the risk: Using these methods to prevent pregnancy: A medicine that kills sperm (spermicide). A device that blocks sperm (diaphragm). Having low levels of a female hormone (estrogen). Being pregnant. You are more likely to develop this condition if: You have genes that add to your risk. You are sexually active. You take antibiotic medicines. You have trouble peeing because of: A prostate that is bigger than normal, if you are female. A blockage in the part of your body that drains pee from the bladder. A kidney stone. A nerve condition that affects your bladder. Not getting enough to drink. Not peeing often enough. You have other conditions, such as: Diabetes. A weak disease-fighting system (immune system). Sickle cell disease. Gout. Injury of the spine. What are the signs or symptoms? Symptoms of this condition include: Needing to pee right away. Peeing small amounts often. Pain or burning when peeing. Blood in the pee. Pee that smells bad or not like normal. Trouble  peeing. Pee that is cloudy. Fluid coming from the vagina, if you are female. Pain in the belly or lower back. Other symptoms include: Vomiting. Not feeling hungry. Feeling mixed up (confused). This may be the first symptom in older adults. Being tired and grouchy (irritable). A fever. Watery poop (diarrhea). How is this treated? Taking antibiotic medicine. Taking other medicines. Drinking enough water. In some cases, you may need to see a specialist. Follow these instructions at home:  Medicines Take over-the-counter and prescription medicines only as told by your doctor. If you were prescribed an antibiotic medicine, take it as told by your doctor. Do not stop taking it even if you start to feel better. General instructions Make sure you: Pee until your bladder is empty. Do not hold pee for a long time. Empty your bladder after sex. Wipe from front to back after peeing or pooping if you are a female. Use each tissue one time when you wipe. Drink enough fluid to keep your pee pale yellow. Keep all follow-up visits. Contact a doctor if: You do not get better after 1-2 days. Your symptoms go away and then come back. Get help right away if: You have very bad back pain. You have very bad pain in your lower belly. You have a fever. You have chills. You feeling like you will vomit or you vomit. Summary A urinary tract infection (UTI) is an infection of any part of the urinary tract. This condition is caused by germs in your genital area. There are many risk factors for a UTI. Treatment includes antibiotic medicines. Drink enough fluid to keep your pee pale yellow. This information is  not intended to replace advice given to you by your health care provider. Make sure you discuss any questions you have with your health care provider. Document Revised: 01/17/2020 Document Reviewed: 01/17/2020 Elsevier Patient Education  [place].

## 2021-12-02 NOTE — Progress Notes (Signed)
Acute Office Visit  Subjective:    Patient ID: Courtney [name], female    DOB: Nov 04, 1945, 76 y.o.   MRN: [id number]  CC: Right flank pain  HPI: Patient is in today for Pain  She reports new onset right mid/lower back pain. was not an injury that may have caused the pain. The pain started yesterday and is staying constant. The pain does not radiate. The pain is described as throbbing, is moderate in intensity, occurring constantly. Symptoms are worse in the: all day  Aggravating factors: "bumps in the road"  Relieving factors: none.  She has tried application of heat, application of ice, acetaminophen, and NSAIDs with no relief.    Past Medical History:  Diagnosis Date   Anemia    Arthritis    Bronchiectasis ([place])    CAP (community acquired pneumonia)  vs Eosinophilic Pna 02/20/8100   Followed in Pulmonary clinic/ [facility]/ [facility]    Cardiac dysrhythmia    Chronic idiopathic constipation    Colitis, acute 02/08/2021   Depression, major, recurrent, moderate (HCC)    Headache    Heart murmur    History of bladder infections    History of COVID-19    Hyperlipidemia    Hypertension    Hypothyroidism    Knee pain, bilateral    Melena 02/08/2021   Osteoarthritis    Pneumonia    Primary insomnia    Recovering alcoholic (HCC)    SVT (supraventricular tachycardia) (HCC)    UTI (urinary tract infection)    Varicose veins    Vitamin B12 deficiency     Past Surgical History:  Procedure Laterality Date   ABDOMINAL HYSTERECTOMY  1991   ANGIOPLASTY     at least 15 years ago, pt. denies    APPENDECTOMY     AUGMENTATION MAMMAPLASTY Bilateral    BACK SURGERY     had 2 surgeries. Have rods placed in back    BARIATRIC SURGERY     lap band-at least 14 years ago   BREAST BIOPSY Left    x2   BREAST ENHANCEMENT SURGERY  2006   CHOLECYSTECTOMY  12/2019   removed lap band.    COLONOSCOPY  12/01/2016   Moderate predominantly sigmod diverticulosis. Otherwise grossly normal  colonoscopy   EYE SURGERY     IOL- bilateral - [place]   KNEE ARTHROSCOPY Left    lap band surgery  1981   LUMBAR FUSION  07/29/2015   posterior level one   removal of cervical disc fragments  10/2007   pt. denies     Family History  Problem Relation Age of Onset   Heart disease Mother    Heart disease Father    Diabetes Sister    Other Sister        BRAIN TUMOR   Heart disease Brother    Heart disease Brother    Heart disease Brother    Heart disease Brother    Heart disease Brother    Colon cancer Maternal Aunt    Colon cancer Cousin     Social History   Socioeconomic History   Marital status: Married    Spouse name: Not on file   Number of children: 2   Years of education: Not on file   Highest education level: Not on file  Occupational History   Occupation: retired    Comment: nurse  Tobacco Use   Smoking status: Never   Smokeless tobacco: Never  Vaping Use   Vaping Use: Never used  Substance and Sexual Activity   Alcohol use: No    Alcohol/week: 0.0 standard drinks of alcohol    Comment: recovery x 20 yrs   Drug use: No   Sexual activity: Not Currently    Comment: MARRIED  Other Topics Concern   Not on file  Social History Narrative   Not on file   Social Determinants of Health   Financial Resource Strain: Medium Risk (11/08/2021)   Overall Financial Resource Strain (CARDIA)    Difficulty of Paying Living Expenses: Somewhat hard  Food Insecurity: Not on file  Transportation Needs: No Transportation Needs (11/08/2021)   PRAPARE - Transportation    Lack of Transportation (Medical): No    Lack of Transportation (Non-Medical): No  Physical Activity: Not on file  Stress: Not on file  Social Connections: Not on file  Intimate Partner Violence: Not on file    Outpatient Medications Prior to Visit  Medication Sig Dispense Refill   albuterol (VENTOLIN HFA) 108 (90 Base) MCG/ACT inhaler Inhale into the lungs.     amLODipine (NORVASC) 5 MG tablet  Take by mouth.     aspirin 81 MG chewable tablet Chew 81 mg by mouth daily.     belladona alk-PHENObarbital (DONNATAL) 16.2 MG tablet Take 1 tablet by mouth every 8 (eight) hours as needed. 60 tablet 1   budesonide-formoterol (SYMBICORT) 160-4.5 MCG/ACT inhaler Inhale 2 puffs into the lungs in the morning and at bedtime. 1 each 6   cetirizine (ZYRTEC) 10 MG tablet Take 1 tablet (10 mg total) by mouth daily. 90 tablet 3   Cholecalciferol (D3-50 PO) Take by mouth.     estradiol (ESTRACE) 0.1 MG/GM vaginal cream Place vaginally.     furosemide (LASIX) 20 MG tablet TAKE 1 TABLET(20 MG) BY MOUTH DAILY 90 tablet 0   gabapentin (NEURONTIN) 600 MG tablet Take 1 tablet (600 mg total) by mouth at bedtime. As needed 90 tablet 1   ipratropium (ATROVENT) 0.02 % nebulizer solution Take 2.5 mLs (0.5 mg total) by nebulization 4 (four) times daily. 100 mL 12   levothyroxine (SYNTHROID) 88 MCG tablet TAKE 1 TABLET(88 MCG) BY MOUTH DAILY BEFORE AND BREAKFAST 90 tablet 2   lisinopril (ZESTRIL) 5 MG tablet Take 5 mg by mouth daily.     metoprolol tartrate (LOPRESSOR) 50 MG tablet TAKE 1 TABLET(50 MG) BY MOUTH TWICE DAILY WITH MEALS 180 tablet 1   Multiple Vitamins-Minerals (MULTIVITAMIN WITH MINERALS) tablet Take 1 tablet by mouth daily.     nitroGLYCERIN (NITROSTAT) 0.4 MG SL tablet PLACE 1 TABLET UNDER THE TONGUE EVERY 5 MINUTES AS NEEDED FOR CHEST PAIN. 25 tablet 0   omeprazole (PRILOSEC) 40 MG capsule TAKE 1 CAPSULE(40 MG) BY MOUTH TWICE DAILY 180 capsule 0   oxyCODONE-acetaminophen (PERCOCET) 10-325 MG tablet Take by mouth.     potassium chloride SA (KLOR-CON M) 20 MEQ tablet TAKE 1 TABLET BY MOUTH DAILY 30 tablet 3   tirzepatide (MOUNJARO) 10 MG/0.5ML Pen Inject 10 mg into the skin once a week. 6 mL 0   tiZANidine (ZANAFLEX) 4 MG tablet Take by mouth.     tretinoin (RETIN-A) 0.05 % cream tretinoin 0.05 % topical cream     vitamin E 180 MG (400 UNITS) capsule Take 400 Units by mouth daily.      Facility-Administered Medications Prior to Visit  Medication Dose Route Frequency Provider Last Rate Last Admin   triamcinolone acetonide (KENALOG-40) injection 40 mg  40 mg Intra-articular Once [name], MD  Allergies  Allergen Reactions   Levofloxacin Hives, Shortness Of Breath and Swelling    RASH IN MOUTH   (can take IV route)   Bactrim [Sulfamethoxazole-Trimethoprim] Hives and Itching   Clarithromycin     GI upset Other reaction(s): GI Upset (intolerance) GI upset -can take but does cause mild upset    Lyrica [Pregabalin]    Macrobid [Nitrofurantoin]     Not tolerate   Repatha [Evolocumab]     Legs hurt.    Statins Other (See Comments)    Myalgias    Review of Systems  Constitutional:  Negative for chills and fever.  Genitourinary:  Positive for dysuria, frequency, hematuria and urgency.  Musculoskeletal:  Positive for back pain.      Objective:    Physical Exam Vitals reviewed.  Constitutional:      Appearance: She is ill-appearing.  Cardiovascular:     Rate and Rhythm: Normal rate and regular rhythm.     Pulses: Normal pulses.     Heart sounds: Normal heart sounds.  Pulmonary:     Effort: Pulmonary effort is normal.     Breath sounds: Normal breath sounds.  Abdominal:     General: Bowel sounds are normal.     Palpations: Abdomen is soft.     Tenderness: There is no abdominal tenderness. There is right CVA tenderness. There is no left CVA tenderness.  Skin:    General: Skin is warm and dry.     Capillary Refill: Capillary refill takes less than 2 seconds.  Neurological:     Mental Status: She is alert.     BP 122/68   Pulse 80   Temp (!) 97.3 F (36.3 C)   Ht '5\' 5"'  (1.651 m)   Wt 156 lb (70.8 kg)   SpO2 99%   BMI 25.96 kg/m   Wt Readings from Last 3 Encounters:  11/11/21 159 lb (72.1 kg)  10/20/21 163 lb (73.9 kg)  09/20/21 165 lb (74.8 kg)    Health Maintenance Due  Topic Date Due   TETANUS/TDAP  Never done   Zoster  Vaccines- Shingrix (1 of 2) Never done   COVID-19 Vaccine (3 - Moderna risk series) 09/09/2019   COLONOSCOPY (Pts 45-29yr Insurance coverage will need to be confirmed)  12/01/2021    Lab Results  Component Value Date   TSH 3.160 07/19/2021   Lab Results  Component Value Date   WBC 6.7 10/20/2021   HGB 13.0 10/20/2021   HCT 39.4 10/20/2021   MCV 94 10/20/2021   PLT 240 10/20/2021   Lab Results  Component Value Date   NA 136 10/20/2021   K 4.6 10/20/2021   CO2 22 10/20/2021   GLUCOSE 88 10/20/2021   BUN 21 10/20/2021   CREATININE 1.11 (H) 10/20/2021   BILITOT 0.3 10/20/2021   ALKPHOS 48 10/20/2021   AST 22 10/20/2021   ALT 20 10/20/2021   PROT 6.8 10/20/2021   ALBUMIN 4.2 10/20/2021   CALCIUM 9.9 10/20/2021   ANIONGAP 9 09/04/2018   EGFR 52 (L) 10/20/2021   Lab Results  Component Value Date   CHOL 284 (H) 10/20/2021   Lab Results  Component Value Date   HDL 58 10/20/2021   Lab Results  Component Value Date   LDLCALC 202 (H) 10/20/2021   Lab Results  Component Value Date   TRIG 134 10/20/2021   Lab Results  Component Value Date   CHOLHDL 4.9 (H) 10/20/2021   Lab Results  Component Value Date  HGBA1C 5.8 (H) 10/20/2021       Assessment & Plan:   1. Acute cystitis with hematuria - cefTRIAXone (ROCEPHIN) injection 1 g - Urine Culture - phenazopyridine (PYRIDIUM) 100 MG tablet; Take 1 tablet (100 mg total) by mouth 3 (three) times daily as needed for pain.  Dispense: 10 tablet; Refill: 0 - ciprofloxacin (CIPRO) 500 MG tablet; Take 1 tablet (500 mg total) by mouth 2 (two) times daily for 5 days.  Dispense: 10 tablet; Refill: 0  2. Right flank pain - POCT URINALYSIS DIP (CLINITEK)  3. Bladder spasms - phenazopyridine (PYRIDIUM) 100 MG tablet; Take 1 tablet (100 mg total) by mouth 3 (three) times daily as needed for pain.  Dispense: 10 tablet; Refill: 0    Rocephin given in office Push fluids, especially water Seek emergency medical care for  severe or worsening symptoms We will call you with culture results Take Pyridium as needed for bladder spasms Follow-up as needed     Follow-up: PRN  An After Visit Summary was printed and given to the patient.  I, Rip Harbour, NP, have reviewed all documentation for this visit. The documentation on 12/02/21 for the exam, diagnosis, procedures, and orders are all accurate and complete.    Signed, Rip Harbour, NP [place] [phone]

## 2021-12-05 LAB — URINE CULTURE

## 2021-12-07 ENCOUNTER — Ambulatory Visit (INDEPENDENT_AMBULATORY_CARE_PROVIDER_SITE_OTHER): Payer: Medicare Other | Admitting: Family Medicine

## 2021-12-07 VITALS — BP 140/74 | HR 80 | Temp 97.2°F | Resp 16 | Ht 65.0 in | Wt 157.0 lb

## 2021-12-07 DIAGNOSIS — F331 Major depressive disorder, recurrent, moderate: Secondary | ICD-10-CM

## 2021-12-07 DIAGNOSIS — R39198 Other difficulties with micturition: Secondary | ICD-10-CM

## 2021-12-07 DIAGNOSIS — L650 Telogen effluvium: Secondary | ICD-10-CM | POA: Diagnosis not present

## 2021-12-07 LAB — POCT URINALYSIS DIPSTICK
Bilirubin, UA: NEGATIVE
Blood, UA: NEGATIVE
Glucose, UA: NEGATIVE
Ketones, UA: NEGATIVE
Leukocytes, UA: NEGATIVE
Nitrite, UA: NEGATIVE
Protein, UA: NEGATIVE
Spec Grav, UA: <=1.005 — AB (ref 1.010–1.025)
Urobilinogen, UA: 0.2 E.U./dL
pH, UA: 7.5 (ref 5.0–8.0)

## 2021-12-07 MED ORDER — FLUOXETINE HCL 20 MG PO CAPS: 20.0000 mg | ORAL_CAPSULE | Freq: Every day | ORAL | 3 refills | Status: DC

## 2021-12-07 NOTE — Progress Notes (Unsigned)
vg  Acute Office Visit  Subjective:    Patient ID: Courtney [name], female    DOB: 10-31-1945, 76 y.o.   MRN: [id number]  Chief Complaint  Patient presents with   Urinary Tract Infection   HPI: Patient is in today for follow up of UTI. UA normal today.  CKD - saw nephrology and several medicines were held. She would like to check.   Took wellbutrin, but not helping. Patient used to take prozac and would like to try this.   Hair loss.   Past Medical History:  Diagnosis Date   Anemia    Arthritis    Bronchiectasis ([place])    CAP (community acquired pneumonia)  vs Eosinophilic Pna 7/0/2637   Followed in Pulmonary clinic/ [facility]/ [facility]    Cardiac dysrhythmia    Chronic idiopathic constipation    Colitis, acute 02/08/2021   Depression, major, recurrent, moderate (HCC)    Headache    Heart murmur    History of bladder infections    History of COVID-19    Hyperlipidemia    Hypertension    Hypothyroidism    Knee pain, bilateral    Melena 02/08/2021   Osteoarthritis    Pneumonia    Primary insomnia    Recovering alcoholic (HCC)    SVT (supraventricular tachycardia) (HCC)    UTI (urinary tract infection)    Varicose veins    Vitamin B12 deficiency     Past Surgical History:  Procedure Laterality Date   ABDOMINAL HYSTERECTOMY  1991   ANGIOPLASTY     at least 15 years ago, pt. denies    APPENDECTOMY     AUGMENTATION MAMMAPLASTY Bilateral    BACK SURGERY     had 2 surgeries. Have rods placed in back    BARIATRIC SURGERY     lap band-at least 14 years ago   BREAST BIOPSY Left    x2   BREAST ENHANCEMENT SURGERY  2006   CHOLECYSTECTOMY  12/2019   removed lap band.    COLONOSCOPY  12/01/2016   Moderate predominantly sigmod diverticulosis. Otherwise grossly normal colonoscopy   EYE SURGERY     IOL- bilateral - [place]   KNEE ARTHROSCOPY Left    lap band surgery  1981   LUMBAR FUSION  07/29/2015   posterior level one   removal of cervical disc fragments   10/2007   pt. denies     Family History  Problem Relation Age of Onset   Heart disease Mother    Heart disease Father    Diabetes Sister    Other Sister        BRAIN TUMOR   Heart disease Brother    Heart disease Brother    Heart disease Brother    Heart disease Brother    Heart disease Brother    Colon cancer Maternal Aunt    Colon cancer Cousin     Social History   Socioeconomic History   Marital status: Married    Spouse name: Not on file   Number of children: 2   Years of education: Not on file   Highest education level: Not on file  Occupational History   Occupation: retired    Comment: Marine scientist  Tobacco Use   Smoking status: Never   Smokeless tobacco: Never  Vaping Use   Vaping Use: Never used  Substance and Sexual Activity   Alcohol use: No    Alcohol/week: 0.0 standard drinks of alcohol    Comment: recovery x 20 yrs  Drug use: No   Sexual activity: Not Currently    Comment: MARRIED  Other Topics Concern   Not on file  Social History Narrative   Not on file   Social Determinants of Health   Financial Resource Strain: Medium Risk (11/08/2021)   Overall Financial Resource Strain (CARDIA)    Difficulty of Paying Living Expenses: Somewhat hard  Food Insecurity: No Food Insecurity (11/18/2020)   Hunger Vital Sign    Worried About Running Out of Food in the Last Year: Never true    Ran Out of Food in the Last Year: Never true  Transportation Needs: No Transportation Needs (11/08/2021)   PRAPARE - Hydrologist (Medical): No    Lack of Transportation (Non-Medical): No  Physical Activity: Not on file  Stress: Not on file  Social Connections: Not on file  Intimate Partner Violence: Not on file    Outpatient Medications Prior to Visit  Medication Sig Dispense Refill   albuterol (VENTOLIN HFA) 108 (90 Base) MCG/ACT inhaler Inhale into the lungs.     amLODipine (NORVASC) 5 MG tablet Take by mouth.     aspirin 81 MG chewable tablet  Chew 81 mg by mouth daily.     belladona alk-PHENObarbital (DONNATAL) 16.2 MG tablet Take 1 tablet by mouth every 8 (eight) hours as needed. 60 tablet 1   budesonide-formoterol (SYMBICORT) 160-4.5 MCG/ACT inhaler Inhale 2 puffs into the lungs in the morning and at bedtime. 1 each 6   cetirizine (ZYRTEC) 10 MG tablet Take 1 tablet (10 mg total) by mouth daily. 90 tablet 3   Cholecalciferol (D3-50 PO) Take by mouth.     estradiol (ESTRACE) 0.1 MG/GM vaginal cream Place vaginally.     furosemide (LASIX) 20 MG tablet TAKE 1 TABLET(20 MG) BY MOUTH DAILY 90 tablet 0   gabapentin (NEURONTIN) 600 MG tablet Take 1 tablet (600 mg total) by mouth at bedtime. As needed 90 tablet 1   ipratropium (ATROVENT) 0.02 % nebulizer solution Take 2.5 mLs (0.5 mg total) by nebulization 4 (four) times daily. 100 mL 12   levothyroxine (SYNTHROID) 88 MCG tablet TAKE 1 TABLET(88 MCG) BY MOUTH DAILY BEFORE AND BREAKFAST 90 tablet 2   lisinopril (ZESTRIL) 5 MG tablet Take 5 mg by mouth daily.     metoprolol tartrate (LOPRESSOR) 50 MG tablet TAKE 1 TABLET(50 MG) BY MOUTH TWICE DAILY WITH MEALS 180 tablet 1   Multiple Vitamins-Minerals (MULTIVITAMIN WITH MINERALS) tablet Take 1 tablet by mouth daily.     nitroGLYCERIN (NITROSTAT) 0.4 MG SL tablet PLACE 1 TABLET UNDER THE TONGUE EVERY 5 MINUTES AS NEEDED FOR CHEST PAIN. 25 tablet 0   omeprazole (PRILOSEC) 40 MG capsule TAKE 1 CAPSULE(40 MG) BY MOUTH TWICE DAILY 180 capsule 0   oxyCODONE-acetaminophen (PERCOCET) 10-325 MG tablet Take by mouth.     phenazopyridine (PYRIDIUM) 100 MG tablet Take 1 tablet (100 mg total) by mouth 3 (three) times daily as needed for pain. 10 tablet 0   potassium chloride SA (KLOR-CON M) 20 MEQ tablet TAKE 1 TABLET BY MOUTH DAILY 30 tablet 3   tirzepatide (MOUNJARO) 10 MG/0.5ML Pen Inject 10 mg into the skin once a week. 6 mL 0   tretinoin (RETIN-A) 0.05 % cream tretinoin 0.05 % topical cream     vitamin E 180 MG (400 UNITS) capsule Take 400 Units by  mouth daily.     ciprofloxacin (CIPRO) 500 MG tablet Take 1 tablet (500 mg total) by mouth 2 (two) times  daily for 5 days. 10 tablet 0   Facility-Administered Medications Prior to Visit  Medication Dose Route Frequency Provider Last Rate Last Admin   triamcinolone acetonide (KENALOG-40) injection 40 mg  40 mg Intra-articular Once [name], Kirsten, MD        Allergies  Allergen Reactions   Levofloxacin Hives, Shortness Of Breath and Swelling    RASH IN MOUTH   (can take IV route)   Bactrim [Sulfamethoxazole-Trimethoprim] Hives and Itching   Clarithromycin     GI upset Other reaction(s): GI Upset (intolerance) GI upset -can take but does cause mild upset    Lyrica [Pregabalin]    Macrobid [Nitrofurantoin]     Not tolerate   Repatha [Evolocumab]     Legs hurt.    Statins Other (See Comments)    Myalgias    Review of Systems  Constitutional:  Negative for chills and fever.  Gastrointestinal:  Positive for nausea.  Genitourinary:  Positive for difficulty urinating and pelvic pain.  Musculoskeletal:  Positive for back pain.  Psychiatric/Behavioral:  Negative for dysphoric mood. The patient is nervous/anxious.        Objective:    Physical Exam  BP 140/74   Pulse 80   Temp (!) 97.2 F (36.2 C)   Resp 16   Ht _0  (1.651 m)   Wt 157 lb (71.2 kg)   BMI 26.13 kg/m  Wt Readings from Last 3 Encounters:  12/07/21 157 lb (71.2 kg)  12/02/21 156 lb (70.8 kg)  11/11/21 159 lb (72.1 kg)    Health Maintenance Due  Topic Date Due   TETANUS/TDAP  Never done   Zoster Vaccines- Shingrix (1 of 2) Never done   COVID-19 Vaccine (3 - Moderna risk series) 09/09/2019   COLONOSCOPY (Pts 45-32yr Insurance coverage will need to be confirmed)  12/01/2021    There are no preventive care reminders to display for this patient.   Lab Results  Component Value Date   TSH 3.160 07/19/2021   Lab Results  Component Value Date   WBC 6.7 10/20/2021   HGB 13.0 10/20/2021   HCT 39.4  10/20/2021   MCV 94 10/20/2021   PLT 240 10/20/2021   Lab Results  Component Value Date   NA 136 10/20/2021   K 4.6 10/20/2021   CO2 22 10/20/2021   GLUCOSE 88 10/20/2021   BUN 21 10/20/2021   CREATININE 1.11 (H) 10/20/2021   BILITOT 0.3 10/20/2021   ALKPHOS 48 10/20/2021   AST 22 10/20/2021   ALT 20 10/20/2021   PROT 6.8 10/20/2021   ALBUMIN 4.2 10/20/2021   CALCIUM 9.9 10/20/2021   ANIONGAP 9 09/04/2018   EGFR 52 (L) 10/20/2021   Lab Results  Component Value Date   CHOL 284 (H) 10/20/2021   Lab Results  Component Value Date   HDL 58 10/20/2021   Lab Results  Component Value Date   LDLCALC 202 (H) 10/20/2021   Lab Results  Component Value Date   TRIG 134 10/20/2021   Lab Results  Component Value Date   CHOLHDL 4.9 (H) 10/20/2021   Lab Results  Component Value Date   HGBA1C 5.8 (H) 10/20/2021       Assessment & Plan:   Problem List Items Addressed This Visit   None Visit Diagnoses     Difficulty urinating    -  Primary   Relevant Orders   POCT urinalysis dipstick (Completed)      No orders of the defined types were placed in this  encounter.   Orders Placed This Encounter  Procedures   POCT urinalysis dipstick     Follow-up: No follow-ups on file.  An After Visit Summary was printed and given to the patient.  [name], MD [name] [phone]

## 2021-12-08 DIAGNOSIS — Z981 Arthrodesis status: Secondary | ICD-10-CM | POA: Diagnosis not present

## 2021-12-08 DIAGNOSIS — M47816 Spondylosis without myelopathy or radiculopathy, lumbar region: Secondary | ICD-10-CM | POA: Diagnosis not present

## 2021-12-08 DIAGNOSIS — M4186 Other forms of scoliosis, lumbar region: Secondary | ICD-10-CM | POA: Diagnosis not present

## 2021-12-08 DIAGNOSIS — M5440 Lumbago with sciatica, unspecified side: Secondary | ICD-10-CM | POA: Diagnosis not present

## 2021-12-08 DIAGNOSIS — M545 Low back pain, unspecified: Secondary | ICD-10-CM | POA: Diagnosis not present

## 2021-12-08 DIAGNOSIS — M4326 Fusion of spine, lumbar region: Secondary | ICD-10-CM | POA: Diagnosis not present

## 2021-12-08 DIAGNOSIS — M533 Sacrococcygeal disorders, not elsewhere classified: Secondary | ICD-10-CM | POA: Diagnosis not present

## 2021-12-08 DIAGNOSIS — G894 Chronic pain syndrome: Secondary | ICD-10-CM | POA: Diagnosis not present

## 2021-12-08 DIAGNOSIS — M961 Postlaminectomy syndrome, not elsewhere classified: Secondary | ICD-10-CM | POA: Diagnosis not present

## 2021-12-08 LAB — CBC WITH DIFFERENTIAL/PLATELET
Basophils Absolute: 0.1 10*3/uL (ref 0.0–0.2)
Basos: 1 %
EOS (ABSOLUTE): 0.7 10*3/uL — ABNORMAL HIGH (ref 0.0–0.4)
Eos: 9 %
Hematocrit: 39.6 % (ref 34.0–46.6)
Hemoglobin: 13.5 g/dL (ref 11.1–15.9)
Immature Grans (Abs): 0 10*3/uL (ref 0.0–0.1)
Immature Granulocytes: 0 %
Lymphocytes Absolute: 2.4 10*3/uL (ref 0.7–3.1)
Lymphs: 29 %
MCH: 31 pg (ref 26.6–33.0)
MCHC: 34.1 g/dL (ref 31.5–35.7)
MCV: 91 fL (ref 79–97)
Monocytes Absolute: 0.7 10*3/uL (ref 0.1–0.9)
Monocytes: 8 %
Neutrophils Absolute: 4.2 10*3/uL (ref 1.4–7.0)
Neutrophils: 53 %
Platelets: 285 10*3/uL (ref 150–450)
RBC: 4.36 x10E6/uL (ref 3.77–5.28)
RDW: 12.6 % (ref 11.7–15.4)
WBC: 8.1 10*3/uL (ref 3.4–10.8)

## 2021-12-08 LAB — FERRITIN: Ferritin: 43 ng/mL (ref 15–150)

## 2021-12-08 LAB — COMPREHENSIVE METABOLIC PANEL
ALT: 31 IU/L (ref 0–32)
AST: 26 IU/L (ref 0–40)
Albumin/Globulin Ratio: 1.8 (ref 1.2–2.2)
Albumin: 4.4 g/dL (ref 3.7–4.7)
Alkaline Phosphatase: 68 IU/L (ref 44–121)
BUN/Creatinine Ratio: 13 (ref 12–28)
BUN: 10 mg/dL (ref 8–27)
Bilirubin Total: 0.2 mg/dL (ref 0.0–1.2)
CO2: 24 mmol/L (ref 20–29)
Calcium: 9.5 mg/dL (ref 8.7–10.3)
Chloride: 97 mmol/L (ref 96–106)
Creatinine, Ser: 0.76 mg/dL (ref 0.57–1.00)
Globulin, Total: 2.5 g/dL (ref 1.5–4.5)
Glucose: 99 mg/dL (ref 70–99)
Potassium: 4.6 mmol/L (ref 3.5–5.2)
Sodium: 134 mmol/L (ref 134–144)
Total Protein: 6.9 g/dL (ref 6.0–8.5)
eGFR: 82 mL/min/{1.73_m2} (ref >59)

## 2021-12-08 LAB — TSH: TSH: 0.325 u[IU]/mL — ABNORMAL LOW (ref 0.450–4.500)

## 2021-12-09 ENCOUNTER — Other Ambulatory Visit: Payer: Self-pay

## 2021-12-09 MED ORDER — LEVOTHYROXINE SODIUM 75 MCG PO TABS: 75.0000 ug | ORAL_TABLET | Freq: Every day | ORAL | 1 refills | Status: DC

## 2021-12-10 DIAGNOSIS — R39198 Other difficulties with micturition: Secondary | ICD-10-CM | POA: Insufficient documentation

## 2021-12-10 NOTE — Assessment & Plan Note (Signed)
Check UA 

## 2021-12-10 NOTE — Assessment & Plan Note (Addendum)
Check Labs. 

## 2021-12-10 NOTE — Assessment & Plan Note (Signed)
Stop Wellbutrin. Started Prozac 20 mg daily. GAD was 21 points.

## 2021-12-12 ENCOUNTER — Encounter: Payer: Self-pay | Admitting: Family Medicine

## 2021-12-15 DIAGNOSIS — M961 Postlaminectomy syndrome, not elsewhere classified: Secondary | ICD-10-CM | POA: Diagnosis not present

## 2021-12-15 DIAGNOSIS — M4126 Other idiopathic scoliosis, lumbar region: Secondary | ICD-10-CM | POA: Diagnosis not present

## 2021-12-15 DIAGNOSIS — M4805 Spinal stenosis, thoracolumbar region: Secondary | ICD-10-CM | POA: Diagnosis not present

## 2021-12-15 DIAGNOSIS — M545 Low back pain, unspecified: Secondary | ICD-10-CM | POA: Diagnosis not present

## 2021-12-15 DIAGNOSIS — M48061 Spinal stenosis, lumbar region without neurogenic claudication: Secondary | ICD-10-CM | POA: Diagnosis not present

## 2021-12-15 DIAGNOSIS — M5137 Other intervertebral disc degeneration, lumbosacral region: Secondary | ICD-10-CM | POA: Diagnosis not present

## 2021-12-15 DIAGNOSIS — G894 Chronic pain syndrome: Secondary | ICD-10-CM | POA: Diagnosis not present

## 2021-12-29 ENCOUNTER — Other Ambulatory Visit: Payer: Self-pay | Admitting: Family Medicine

## 2022-01-03 ENCOUNTER — Ambulatory Visit (INDEPENDENT_AMBULATORY_CARE_PROVIDER_SITE_OTHER): Payer: Medicare Other | Admitting: Nurse Practitioner

## 2022-01-03 ENCOUNTER — Encounter: Payer: Self-pay | Admitting: Nurse Practitioner

## 2022-01-03 VITALS — BP 118/70 | HR 67 | Temp 97.3°F | Wt 157.0 lb

## 2022-01-03 DIAGNOSIS — R600 Localized edema: Secondary | ICD-10-CM

## 2022-01-03 DIAGNOSIS — N3000 Acute cystitis without hematuria: Secondary | ICD-10-CM

## 2022-01-03 LAB — POCT URINALYSIS DIP (CLINITEK)
Blood, UA: NEGATIVE
Glucose, UA: NEGATIVE mg/dL
Ketones, POC UA: NEGATIVE mg/dL
Nitrite, UA: POSITIVE — AB
Spec Grav, UA: 1.01 (ref 1.010–1.025)
Urobilinogen, UA: 2 E.U./dL — AB
pH, UA: 8.5 — AB (ref 5.0–8.0)

## 2022-01-03 MED ORDER — AMOXICILLIN-POT CLAVULANATE 875-125 MG PO TABS: 1.0000 | ORAL_TABLET | Freq: Two times a day (BID) | ORAL | 0 refills | Status: DC

## 2022-01-03 MED ORDER — FUROSEMIDE 20 MG PO TABS: ORAL_TABLET | ORAL | 0 refills | Status: DC

## 2022-01-03 NOTE — Progress Notes (Signed)
Acute Office Visit  Subjective:    Patient ID: Courtney [name], female    DOB: June 17, 1946, 76 y.o.   MRN: [id number]  Chief Complaint  Patient presents with   Urinary Tract Infection    HPI: Patient is in today for Urinary symptoms  She reports new onset dysuria and urinary frequency. Denies fever, chills, hematuria, or flank pain. The current episode started  4 days ago and is staying constant. Treatment has included Azo. Patient states symptoms are moderate in intensity, occurring constantly. She  has been recently treated for similar symptoms with a course of Cipro 12/02/21.       Past Medical History:  Diagnosis Date   Anemia    Arthritis    Bronchiectasis ([place])    CAP (community acquired pneumonia)  vs Eosinophilic Pna 07/27/5168   Followed in Pulmonary clinic/ [facility]/ [place]    Cardiac dysrhythmia    Chronic idiopathic constipation    Colitis, acute 02/08/2021   Depression, major, recurrent, moderate (HCC)    Headache    Heart murmur    History of bladder infections    History of COVID-19    Hyperlipidemia    Hypertension    Hypothyroidism    Knee pain, bilateral    Melena 02/08/2021   Osteoarthritis    Pneumonia    Primary insomnia    Recovering alcoholic (HCC)    SVT (supraventricular tachycardia) (HCC)    UTI (urinary tract infection)    Varicose veins    Vitamin B12 deficiency     Past Surgical History:  Procedure Laterality Date   ABDOMINAL HYSTERECTOMY  1991   ANGIOPLASTY     at least 15 years ago, pt. denies    APPENDECTOMY     AUGMENTATION MAMMAPLASTY Bilateral    BACK SURGERY     had 2 surgeries. Have rods placed in back    BARIATRIC SURGERY     lap band-at least 14 years ago   BREAST BIOPSY Left    x2   BREAST ENHANCEMENT SURGERY  2006   CHOLECYSTECTOMY  12/2019   removed lap band.    COLONOSCOPY  12/01/2016   Moderate predominantly sigmod diverticulosis. Otherwise grossly normal colonoscopy   EYE SURGERY     IOL- bilateral -  [place]   KNEE ARTHROSCOPY Left    lap band surgery  1981   LUMBAR FUSION  07/29/2015   posterior level one   removal of cervical disc fragments  10/2007   pt. denies     Family History  Problem Relation Age of Onset   Heart disease Mother    Heart disease Father    Diabetes Sister    Other Sister        BRAIN TUMOR   Heart disease Brother    Heart disease Brother    Heart disease Brother    Heart disease Brother    Heart disease Brother    Colon cancer Maternal Aunt    Colon cancer Cousin     Social History   Socioeconomic History   Marital status: Married    Spouse name: Not on file   Number of children: 2   Years of education: Not on file   Highest education level: Not on file  Occupational History   Occupation: retired    Comment: Marine scientist  Tobacco Use   Smoking status: Never   Smokeless tobacco: Never  Vaping Use   Vaping Use: Never used  Substance and Sexual Activity   Alcohol use:  No    Alcohol/week: 0.0 standard drinks of alcohol    Comment: recovery x 20 yrs   Drug use: No   Sexual activity: Not Currently    Comment: MARRIED  Other Topics Concern   Not on file  Social History Narrative   Not on file   Social Determinants of Health   Financial Resource Strain: Medium Risk (11/08/2021)   Overall Financial Resource Strain (CARDIA)    Difficulty of Paying Living Expenses: Somewhat hard  Food Insecurity: No Food Insecurity (11/18/2020)   Hunger Vital Sign    Worried About Running Out of Food in the Last Year: Never true    Ran Out of Food in the Last Year: Never true  Transportation Needs: No Transportation Needs (11/08/2021)   PRAPARE - Hydrologist (Medical): No    Lack of Transportation (Non-Medical): No  Physical Activity: Not on file  Stress: Not on file  Social Connections: Not on file  Intimate Partner Violence: Not on file    Outpatient Medications Prior to Visit  Medication Sig Dispense Refill   albuterol  (VENTOLIN HFA) 108 (90 Base) MCG/ACT inhaler Inhale into the lungs.     amLODipine (NORVASC) 5 MG tablet Take by mouth.     aspirin 81 MG chewable tablet Chew 81 mg by mouth daily.     belladona alk-PHENObarbital (DONNATAL) 16.2 MG tablet Take 1 tablet by mouth every 8 (eight) hours as needed. 60 tablet 1   budesonide-formoterol (SYMBICORT) 160-4.5 MCG/ACT inhaler Inhale 2 puffs into the lungs in the morning and at bedtime. 1 each 6   cetirizine (ZYRTEC) 10 MG tablet Take 1 tablet (10 mg total) by mouth daily. 90 tablet 3   Cholecalciferol (D3-50 PO) Take by mouth.     estradiol (ESTRACE) 0.1 MG/GM vaginal cream Place vaginally.     FLUoxetine (PROZAC) 20 MG capsule Take 1 capsule (20 mg total) by mouth daily. 90 capsule 3   furosemide (LASIX) 20 MG tablet TAKE 1 TABLET(20 MG) BY MOUTH DAILY 90 tablet 0   gabapentin (NEURONTIN) 600 MG tablet Take 1 tablet (600 mg total) by mouth at bedtime. As needed 90 tablet 1   ipratropium (ATROVENT) 0.02 % nebulizer solution Take 2.5 mLs (0.5 mg total) by nebulization 4 (four) times daily. 100 mL 12   levothyroxine (SYNTHROID) 75 MCG tablet Take 1 tablet (75 mcg total) by mouth daily. 90 tablet 1   lisinopril (ZESTRIL) 5 MG tablet Take 5 mg by mouth daily.     metoprolol tartrate (LOPRESSOR) 50 MG tablet TAKE 1 TABLET(50 MG) BY MOUTH TWICE DAILY WITH MEALS 180 tablet 1   montelukast (SINGULAIR) 10 MG tablet TAKE 1 TABLET(10 MG) BY MOUTH AT BEDTIME 90 tablet 3   Multiple Vitamins-Minerals (MULTIVITAMIN WITH MINERALS) tablet Take 1 tablet by mouth daily.     nitroGLYCERIN (NITROSTAT) 0.4 MG SL tablet PLACE 1 TABLET UNDER THE TONGUE EVERY 5 MINUTES AS NEEDED FOR CHEST PAIN. 25 tablet 0   omeprazole (PRILOSEC) 40 MG capsule TAKE 1 CAPSULE(40 MG) BY MOUTH TWICE DAILY 180 capsule 0   oxyCODONE-acetaminophen (PERCOCET) 10-325 MG tablet Take by mouth.     phenazopyridine (PYRIDIUM) 100 MG tablet Take 1 tablet (100 mg total) by mouth 3 (three) times daily as needed  for pain. 10 tablet 0   potassium chloride SA (KLOR-CON M) 20 MEQ tablet TAKE 1 TABLET BY MOUTH DAILY 30 tablet 3   tirzepatide (MOUNJARO) 10 MG/0.5ML Pen Inject 10 mg into the  skin once a week. 6 mL 0   tretinoin (RETIN-A) 0.05 % cream tretinoin 0.05 % topical cream     vitamin E 180 MG (400 UNITS) capsule Take 400 Units by mouth daily.     Facility-Administered Medications Prior to Visit  Medication Dose Route Frequency Provider Last Rate Last Admin   triamcinolone acetonide (KENALOG-40) injection 40 mg  40 mg Intra-articular Once [name], Kirsten, MD        Allergies  Allergen Reactions   Levofloxacin Hives, Shortness Of Breath and Swelling    RASH IN MOUTH   (can take IV route)   Bactrim [Sulfamethoxazole-Trimethoprim] Hives and Itching   Clarithromycin     GI upset Other reaction(s): GI Upset (intolerance) GI upset -can take but does cause mild upset    Lyrica [Pregabalin]    Macrobid [Nitrofurantoin]     Not tolerate   Repatha [Evolocumab]     Legs hurt.    Statins Other (See Comments)    Myalgias    Review of Systems See pertinent positives and negatives per HPI.     Objective:    Physical Exam Vitals reviewed.  Constitutional:      Appearance: Normal appearance. She is not ill-appearing.  Skin:    General: Skin is warm and dry.     Capillary Refill: Capillary refill takes less than 2 seconds.  Neurological:     General: No focal deficit present.     Mental Status: She is alert and oriented to person, place, and time.  Psychiatric:        Mood and Affect: Mood normal.        Behavior: Behavior normal.     BP 118/70   Pulse 67   Temp (!) 97.3 F (36.3 C)   Wt 157 lb (71.2 kg)   SpO2 100%   BMI 26.13 kg/m   Wt Readings from Last 3 Encounters:  01/03/22 157 lb (71.2 kg)  12/07/21 157 lb (71.2 kg)  12/02/21 156 lb (70.8 kg)    Health Maintenance Due  Topic Date Due   TETANUS/TDAP  Never done   Zoster Vaccines- Shingrix (1 of 2) Never done   COVID-19  Vaccine (3 - Moderna risk series) 09/09/2019   COLONOSCOPY (Pts 45-33yr Insurance coverage will need to be confirmed)  12/01/2021       Lab Results  Component Value Date   TSH 0.325 (L) 12/07/2021   Lab Results  Component Value Date   WBC 8.1 12/07/2021   HGB 13.5 12/07/2021   HCT 39.6 12/07/2021   MCV 91 12/07/2021   PLT 285 12/07/2021   Lab Results  Component Value Date   NA 134 12/07/2021   K 4.6 12/07/2021   CO2 24 12/07/2021   GLUCOSE 99 12/07/2021   BUN 10 12/07/2021   CREATININE 0.76 12/07/2021   BILITOT 0.2 12/07/2021   ALKPHOS 68 12/07/2021   AST 26 12/07/2021   ALT 31 12/07/2021   PROT 6.9 12/07/2021   ALBUMIN 4.4 12/07/2021   CALCIUM 9.5 12/07/2021   ANIONGAP 9 09/04/2018   EGFR 82 12/07/2021   Lab Results  Component Value Date   CHOL 284 (H) 10/20/2021   Lab Results  Component Value Date   HDL 58 10/20/2021   Lab Results  Component Value Date   LDLCALC 202 (H) 10/20/2021   Lab Results  Component Value Date   TRIG 134 10/20/2021   Lab Results  Component Value Date   CHOLHDL 4.9 (H) 10/20/2021   Lab  Results  Component Value Date   HGBA1C 5.8 (H) 10/20/2021       Assessment & Plan:   1. Acute cystitis without hematuria - POCT URINALYSIS DIP (CLINITEK) - Urine Culture - amoxicillin-clavulanate (AUGMENTIN) 875-125 MG tablet; Take 1 tablet by mouth 2 (two) times daily.  Dispense: 20 tablet; Refill: 0  2. Pedal edema - furosemide (LASIX) 20 MG tablet; TAKE 1 TABLET(20 MG) BY MOUTH DAILY  Dispense: 90 tablet; Refill: 0   Push fluids, especially water Take Amoxicillin twice daily 10 days Notify office if symptoms fail to improve    Orders Placed This Encounter  Procedures   Urine Culture   POCT URINALYSIS DIP (CLINITEK)     Follow-up: PRN  An After Visit Summary was printed and given to the patient.  I, Rip Harbour, NP, have reviewed all documentation for this visit. The documentation on 01/03/22 for the exam,  diagnosis, procedures, and orders are all accurate and complete.   Signed, Rip Harbour, NP [place] [phone]

## 2022-01-03 NOTE — Patient Instructions (Signed)
Push fluids, especially water Take Amoxicillin twice daily 10 days Notify office if symptoms fail to improve    Urinary Tract Infection, Adult A urinary tract infection (UTI) is an infection of any part of the urinary tract. The urinary tract includes: The kidneys. The ureters. The bladder. The urethra. These organs make, store, and get rid of pee (urine) in the body. What are the causes? This infection is caused by germs (bacteria) in your genital area. These germs grow and cause swelling (inflammation) of your urinary tract. What increases the risk? The following factors may make you more likely to develop this condition: Using a small, thin tube (catheter) to drain pee. Not being able to control when you pee or poop (incontinence). Being female. If you are female, these things can increase the risk: Using these methods to prevent pregnancy: A medicine that kills sperm (spermicide). A device that blocks sperm (diaphragm). Having low levels of a female hormone (estrogen). Being pregnant. You are more likely to develop this condition if: You have genes that add to your risk. You are sexually active. You take antibiotic medicines. You have trouble peeing because of: A prostate that is bigger than normal, if you are female. A blockage in the part of your body that drains pee from the bladder. A kidney stone. A nerve condition that affects your bladder. Not getting enough to drink. Not peeing often enough. You have other conditions, such as: Diabetes. A weak disease-fighting system (immune system). Sickle cell disease. Gout. Injury of the spine. What are the signs or symptoms? Symptoms of this condition include: Needing to pee right away. Peeing small amounts often. Pain or burning when peeing. Blood in the pee. Pee that smells bad or not like normal. Trouble peeing. Pee that is cloudy. Fluid coming from the vagina, if you are female. Pain in the belly or lower  back. Other symptoms include: Vomiting. Not feeling hungry. Feeling mixed up (confused). This may be the first symptom in older adults. Being tired and grouchy (irritable). A fever. Watery poop (diarrhea). How is this treated? Taking antibiotic medicine. Taking other medicines. Drinking enough water. In some cases, you may need to see a specialist. Follow these instructions at home:  Medicines Take over-the-counter and prescription medicines only as told by your doctor. If you were prescribed an antibiotic medicine, take it as told by your doctor. Do not stop taking it even if you start to feel better. General instructions Make sure you: Pee until your bladder is empty. Do not hold pee for a long time. Empty your bladder after sex. Wipe from front to back after peeing or pooping if you are a female. Use each tissue one time when you wipe. Drink enough fluid to keep your pee pale yellow. Keep all follow-up visits. Contact a doctor if: You do not get better after 1-2 days. Your symptoms go away and then come back. Get help right away if: You have very bad back pain. You have very bad pain in your lower belly. You have a fever. You have chills. You feeling like you will vomit or you vomit. Summary A urinary tract infection (UTI) is an infection of any part of the urinary tract. This condition is caused by germs in your genital area. There are many risk factors for a UTI. Treatment includes antibiotic medicines. Drink enough fluid to keep your pee pale yellow. This information is not intended to replace advice given to you by your health care provider. Make sure you  discuss any questions you have with your health care provider. Document Revised: 01/17/2020 Document Reviewed: 01/17/2020 Elsevier Patient Education  [place].

## 2022-01-06 ENCOUNTER — Other Ambulatory Visit: Payer: Self-pay

## 2022-01-06 DIAGNOSIS — E782 Mixed hyperlipidemia: Secondary | ICD-10-CM

## 2022-01-06 DIAGNOSIS — N1832 Chronic kidney disease, stage 3b: Secondary | ICD-10-CM

## 2022-01-06 DIAGNOSIS — R7303 Prediabetes: Secondary | ICD-10-CM

## 2022-01-06 DIAGNOSIS — I119 Hypertensive heart disease without heart failure: Secondary | ICD-10-CM

## 2022-01-06 LAB — URINE CULTURE

## 2022-01-10 DIAGNOSIS — M47816 Spondylosis without myelopathy or radiculopathy, lumbar region: Secondary | ICD-10-CM | POA: Diagnosis not present

## 2022-01-20 ENCOUNTER — Telehealth: Payer: Self-pay

## 2022-01-20 DIAGNOSIS — R55 Syncope and collapse: Secondary | ICD-10-CM | POA: Diagnosis not present

## 2022-01-20 DIAGNOSIS — I959 Hypotension, unspecified: Secondary | ICD-10-CM | POA: Diagnosis not present

## 2022-01-20 DIAGNOSIS — S92901A Unspecified fracture of right foot, initial encounter for closed fracture: Secondary | ICD-10-CM | POA: Diagnosis not present

## 2022-01-20 DIAGNOSIS — M25571 Pain in right ankle and joints of right foot: Secondary | ICD-10-CM | POA: Diagnosis not present

## 2022-01-20 NOTE — Telephone Encounter (Signed)
Daughter called and stated patient has diarrhea, and she got sweaty, and clammy and she passed out.  I recommend that patient get checked out at Urgent care or nearest hospital. Patient denied and stated she will wait till in the morning to see a provider. Her daughter stated that id she gets worst she will take her to the nearest hospital or call 911.

## 2022-01-21 ENCOUNTER — Ambulatory Visit: Payer: Medicare Other | Admitting: Nurse Practitioner

## 2022-01-21 DIAGNOSIS — S8264XA Nondisplaced fracture of lateral malleolus of right fibula, initial encounter for closed fracture: Secondary | ICD-10-CM | POA: Diagnosis not present

## 2022-01-21 DIAGNOSIS — M25571 Pain in right ankle and joints of right foot: Secondary | ICD-10-CM | POA: Diagnosis not present

## 2022-01-21 NOTE — Telephone Encounter (Signed)
Daughter Judeen Hammans and patient called to make [place] aware patient did go to urgent care. Patient has a broke ankle and will be following up with orthopedics this morning.   Same day appointment for this morning cancelled as orthopedics appointment is at same time. Patient scheduled with Dr Tobie Poet on Monday for UC follow up as patient requested.   Royce Macadamia, [place] 01/21/22 8:26 AM

## 2022-01-24 ENCOUNTER — Ambulatory Visit: Payer: Medicare Other | Admitting: Family Medicine

## 2022-01-25 DIAGNOSIS — G894 Chronic pain syndrome: Secondary | ICD-10-CM | POA: Diagnosis not present

## 2022-01-25 DIAGNOSIS — M961 Postlaminectomy syndrome, not elsewhere classified: Secondary | ICD-10-CM | POA: Diagnosis not present

## 2022-01-25 DIAGNOSIS — Z79899 Other long term (current) drug therapy: Secondary | ICD-10-CM | POA: Diagnosis not present

## 2022-01-25 DIAGNOSIS — M47816 Spondylosis without myelopathy or radiculopathy, lumbar region: Secondary | ICD-10-CM | POA: Diagnosis not present

## 2022-01-26 ENCOUNTER — Telehealth: Payer: Self-pay

## 2022-01-26 NOTE — Progress Notes (Signed)
Chronic Care Management Pharmacy Assistant   Name: Courtney [name]  MRN: [id number] DOB: [birth year]   Reason for Encounter: Disease State call for DM    Recent office visits:  01/03/22 Jerrell Belfast NP. Seen for UTI. Started on Augmentin.  12/09/21 Jackie Plum CMA. Orders Only. Decreased Levothyroxine to 72mg.   12/07/21 CRochel BromeMD. Seen for UTI. Started on Fluoxetine 274mdaily. Stop Wellbutrin.  12/02/21 HeJerrell BelfastP. Seen for UTI. Started on Ciprofloxacin 50011mnd Phenazopyridine 100m58m 11/11/21 PerrReinaldo Meeker Seen for Diverticulitis. Started on belladona alk-PHENObarbital (DONNATAL) 16.2 MG tablet.  Recent consult visits:  01/10/22 (Surgery) GalaAnastasio Champion. Seen for Lumbar Medial Branch Block. No med changes.  Hospital visits:  None  Medications: Outpatient Encounter Medications as of 01/26/2022  Medication Sig   albuterol (VENTOLIN HFA) 108 (90 Base) MCG/ACT inhaler Inhale into the lungs.   amLODipine (NORVASC) 5 MG tablet Take by mouth.   amoxicillin-clavulanate (AUGMENTIN) 875-125 MG tablet Take 1 tablet by mouth 2 (two) times daily.   aspirin 81 MG chewable tablet Chew 81 mg by mouth daily.   belladona alk-PHENObarbital (DONNATAL) 16.2 MG tablet Take 1 tablet by mouth every 8 (eight) hours as needed.   budesonide-formoterol (SYMBICORT) 160-4.5 MCG/ACT inhaler Inhale 2 puffs into the lungs in the morning and at bedtime.   cetirizine (ZYRTEC) 10 MG tablet Take 1 tablet (10 mg total) by mouth daily.   Cholecalciferol (D3-50 PO) Take by mouth.   estradiol (ESTRACE) 0.1 MG/GM vaginal cream Place vaginally.   FLUoxetine (PROZAC) 20 MG capsule Take 1 capsule (20 mg total) by mouth daily.   furosemide (LASIX) 20 MG tablet TAKE 1 TABLET(20 MG) BY MOUTH DAILY   gabapentin (NEURONTIN) 600 MG tablet Take 1 tablet (600 mg total) by mouth at bedtime. As needed   ipratropium (ATROVENT) 0.02 % nebulizer solution Take 2.5 mLs (0.5 mg total) by nebulization 4  (four) times daily.   levothyroxine (SYNTHROID) 75 MCG tablet Take 1 tablet (75 mcg total) by mouth daily.   lisinopril (ZESTRIL) 5 MG tablet Take 5 mg by mouth daily.   metoprolol tartrate (LOPRESSOR) 50 MG tablet TAKE 1 TABLET(50 MG) BY MOUTH TWICE DAILY WITH MEALS   montelukast (SINGULAIR) 10 MG tablet TAKE 1 TABLET(10 MG) BY MOUTH AT BEDTIME   Multiple Vitamins-Minerals (MULTIVITAMIN WITH MINERALS) tablet Take 1 tablet by mouth daily.   nitroGLYCERIN (NITROSTAT) 0.4 MG SL tablet PLACE 1 TABLET UNDER THE TONGUE EVERY 5 MINUTES AS NEEDED FOR CHEST PAIN.   omeprazole (PRILOSEC) 40 MG capsule TAKE 1 CAPSULE(40 MG) BY MOUTH TWICE DAILY   oxyCODONE-acetaminophen (PERCOCET) 10-325 MG tablet Take by mouth.   phenazopyridine (PYRIDIUM) 100 MG tablet Take 1 tablet (100 mg total) by mouth 3 (three) times daily as needed for pain.   potassium chloride SA (KLOR-CON M) 20 MEQ tablet TAKE 1 TABLET BY MOUTH DAILY   tirzepatide (MOUNJARO) 10 MG/0.5ML Pen Inject 10 mg into the skin once a week.   tretinoin (RETIN-A) 0.05 % cream tretinoin 0.05 % topical cream   vitamin E 180 MG (400 UNITS) capsule Take 400 Units by mouth daily.   Facility-Administered Encounter Medications as of 01/26/2022  Medication   triamcinolone acetonide (KENALOG-40) injection 40 mg    Recent Relevant Labs: Lab Results  Component Value Date/Time   HGBA1C 5.8 (H) 10/20/2021 10:01 AM   HGBA1C 6.3 (H) 07/19/2021 09:05 AM    Kidney Function Lab Results  Component Value Date/Time   CREATININE 0.76  12/07/2021 04:34 PM   CREATININE 1.11 (H) 10/20/2021 10:01 AM   GFRNONAA 66 02/17/2020 04:38 PM   GFRAA 76 02/17/2020 04:38 PM     Current antihyperglycemic regimen:   [place] 7.47m once a week  Patient verbally confirms she is taking the above medications as directed. Yes  What recent interventions/DTPs have been made to improve glycemic control:  No recent changes   Have there been any recent hospitalizations or ED visits  since last visit with CPP? No  Patient denies hypoglycemic symptoms, including None  Patient denies hyperglycemic symptoms, including none  How often are you checking your blood sugar? in the morning before eating or drinking  What are your blood sugars ranging?  Fasting: 110, 120  On insulin? No  During the week, how often does your blood glucose drop below 70? Never  Are you checking your feet daily/regularly? Yes  Adherence Review: Is the patient currently on a STATIN medication? No Is the patient currently on ACE/ARB medication? Yes Does the patient have >5 day gap between last estimated fill dates? CPP to review  Care Gaps: Last eye exam / Retinopathy Screening? None Last Annual Wellness Visit? None noted  Last Diabetic Foot Exam? None    Star Rating Drugs:  Medication:  Last Fill: Day Supply None noted    DElray Mcgregor CKenneyPharmacist Assistant  [phone]

## 2022-01-28 DIAGNOSIS — S8264XA Nondisplaced fracture of lateral malleolus of right fibula, initial encounter for closed fracture: Secondary | ICD-10-CM | POA: Diagnosis not present

## 2022-02-07 DIAGNOSIS — M47816 Spondylosis without myelopathy or radiculopathy, lumbar region: Secondary | ICD-10-CM | POA: Diagnosis not present

## 2022-02-08 ENCOUNTER — Ambulatory Visit (INDEPENDENT_AMBULATORY_CARE_PROVIDER_SITE_OTHER): Payer: Medicare Other | Admitting: Family Medicine

## 2022-02-08 ENCOUNTER — Encounter: Payer: Self-pay | Admitting: Family Medicine

## 2022-02-08 VITALS — BP 118/68 | HR 84 | Temp 97.4°F | Ht 65.0 in | Wt 144.0 lb

## 2022-02-08 DIAGNOSIS — R55 Syncope and collapse: Secondary | ICD-10-CM | POA: Diagnosis not present

## 2022-02-08 DIAGNOSIS — R7303 Prediabetes: Secondary | ICD-10-CM

## 2022-02-08 DIAGNOSIS — M791 Myalgia, unspecified site: Secondary | ICD-10-CM

## 2022-02-08 DIAGNOSIS — N3000 Acute cystitis without hematuria: Secondary | ICD-10-CM | POA: Diagnosis not present

## 2022-02-08 DIAGNOSIS — E038 Other specified hypothyroidism: Secondary | ICD-10-CM

## 2022-02-08 DIAGNOSIS — I2510 Atherosclerotic heart disease of native coronary artery without angina pectoris: Secondary | ICD-10-CM | POA: Diagnosis not present

## 2022-02-08 DIAGNOSIS — N1832 Chronic kidney disease, stage 3b: Secondary | ICD-10-CM

## 2022-02-08 DIAGNOSIS — F331 Major depressive disorder, recurrent, moderate: Secondary | ICD-10-CM

## 2022-02-08 DIAGNOSIS — E782 Mixed hyperlipidemia: Secondary | ICD-10-CM | POA: Diagnosis not present

## 2022-02-08 DIAGNOSIS — S8264XA Nondisplaced fracture of lateral malleolus of right fibula, initial encounter for closed fracture: Secondary | ICD-10-CM | POA: Diagnosis not present

## 2022-02-08 DIAGNOSIS — T466X5A Adverse effect of antihyperlipidemic and antiarteriosclerotic drugs, initial encounter: Secondary | ICD-10-CM

## 2022-02-08 LAB — POCT URINALYSIS DIPSTICK
Bilirubin, UA: NEGATIVE
Blood, UA: NEGATIVE
Glucose, UA: NEGATIVE
Ketones, UA: NEGATIVE
Nitrite, UA: NEGATIVE
Protein, UA: NEGATIVE
Spec Grav, UA: 1.01 (ref 1.010–1.025)
Urobilinogen, UA: NEGATIVE E.U./dL — AB
pH, UA: 7.5 (ref 5.0–8.0)

## 2022-02-08 MED ORDER — CIPROFLOXACIN HCL 500 MG PO TABS: 500.0000 mg | ORAL_TABLET | Freq: Two times a day (BID) | ORAL | 0 refills | Status: AC

## 2022-02-08 NOTE — Progress Notes (Signed)
Acute Office Visit  Subjective:    Patient ID: Courtney [name], female    DOB: [birth year], 76 y.o.   MRN: [id number]  Chief Complaint  Patient presents with   Fatigued    HPI Patient is in today for follow up. Patient had a fall on 01/20/2022. Patient passed out due to severe abdominal pain and diarrhea. Patient was dehydrated. She fell fractured her right fibula. Patient was taken to [facility] and treated for fracture. Sent to [facility] and given larger boot. No surgery was required. UC recommend she go to the ED due to hypotension. Patient refused and went home. Took dicyclomine for abdominal pain and drank lots of fluids. No longer having abdominal pain. Patient is having urgency, frequency, and dysuria. Patient is exhausted. Patient has been having bladder symptoms x 1 week.   PreDiabetes:  Most recent A1C: 5.8 Current medications: moujaro  Hyperlipidemia: Current medications: on zetia 10 mg daily. Intolerant to statins, repatha.  Hypertension: Complications: CORONARY ARTERY DISEASE. Current medications: lisinopril 5 mg daily, metoprolol tartrate 50 mg twice daily, and furosemide 20 mg daily.    Diet: healthy Exercise: none.  Depression: On prozac 20 mg daily.    02/08/2022    9:18 AM 11/08/2021    8:46 AM 07/19/2021    8:53 AM  PHQ9 SCORE ONLY  PHQ-9 Total Score '7 8 12   ' 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 0/12/3708   Followed in Pulmonary clinic/ [facility]/ [facility]    Cardiac dysrhythmia    Chronic idiopathic constipation    Colitis, acute 02/08/2021   Depression, major, recurrent, moderate (HCC)    Headache    Heart murmur    History of bladder infections    History of COVID-19    Hyperlipidemia    Hypertension    Hypothyroidism    Knee pain, bilateral    Melena 02/08/2021   Osteoarthritis    Pneumonia    Primary insomnia    Recovering  alcoholic (HCC)    SVT (supraventricular tachycardia) (HCC)    UTI (urinary tract infection)    Varicose veins    Vitamin B12 deficiency     Past Surgical History:  Procedure Laterality Date   ABDOMINAL HYSTERECTOMY  1991   ANGIOPLASTY     at least 15 years ago, pt. denies    APPENDECTOMY     AUGMENTATION MAMMAPLASTY Bilateral    BACK SURGERY     had 2 surgeries. Have rods placed in back    BARIATRIC SURGERY     lap band-at least 14 years ago   BREAST BIOPSY Left    x2   BREAST ENHANCEMENT SURGERY  2006   CHOLECYSTECTOMY  12/2019   removed lap band.    COLONOSCOPY  12/01/2016   Moderate predominantly sigmod diverticulosis. Otherwise grossly normal colonoscopy   EYE SURGERY     IOL- bilateral - [place]   KNEE ARTHROSCOPY Left    lap band surgery  1981   LUMBAR FUSION  07/29/2015   posterior level one   removal of cervical disc fragments  10/2007   pt. denies     Family History  Problem Relation Age of Onset   Heart disease Mother    Heart disease Father    Diabetes Sister    Other Sister        BRAIN TUMOR   Heart disease  Brother    Heart disease Brother    Heart disease Brother    Heart disease Brother    Heart disease Brother    Colon cancer Maternal Aunt    Colon cancer Cousin     Social History   Socioeconomic History   Marital status: Married    Spouse name: Not on file   Number of children: 2   Years of education: Not on file   Highest education level: Not on file  Occupational History   Occupation: retired    Comment: Marine scientist  Tobacco Use   Smoking status: Never   Smokeless tobacco: Never  Vaping Use   Vaping Use: Never used  Substance and Sexual Activity   Alcohol use: No    Alcohol/week: 0.0 standard drinks of alcohol    Comment: recovery x 20 yrs   Drug use: No   Sexual activity: Not Currently    Comment: MARRIED  Other Topics Concern   Not on file  Social History Narrative   Not on file   Social Determinants of Health    Financial Resource Strain: Medium Risk (11/08/2021)   Overall Financial Resource Strain (CARDIA)    Difficulty of Paying Living Expenses: Somewhat hard  Food Insecurity: No Food Insecurity (11/18/2020)   Hunger Vital Sign    Worried About Running Out of Food in the Last Year: Never true    Ran Out of Food in the Last Year: Never true  Transportation Needs: No Transportation Needs (11/08/2021)   PRAPARE - Hydrologist (Medical): No    Lack of Transportation (Non-Medical): No  Physical Activity: Not on file  Stress: Not on file  Social Connections: Not on file  Intimate Partner Violence: Not on file    Outpatient Medications Prior to Visit  Medication Sig Dispense Refill   albuterol (VENTOLIN HFA) 108 (90 Base) MCG/ACT inhaler Inhale into the lungs.     bell[place]ona alk-PHENObarbital (DONNATAL) 16.2 MG tablet Take 1 tablet by mouth every 8 (eight) hours as needed. 60 tablet 1   budesonide-formoterol (SYMBICORT) 160-4.5 MCG/ACT inhaler Inhale 2 puffs into the lungs in the morning and at bedtime. 1 each 6   cetirizine (ZYRTEC) 10 MG tablet Take 1 tablet (10 mg total) by mouth daily. 90 tablet 3   Cholecalciferol (D3-50 PO) Take by mouth.     FLUoxetine (PROZAC) 20 MG capsule Take 1 capsule (20 mg total) by mouth daily. 90 capsule 3   furosemide (LASIX) 20 MG tablet TAKE 1 TABLET(20 MG) BY MOUTH DAILY 90 tablet 0   gabapentin (NEURONTIN) 600 MG tablet Take 1 tablet (600 mg total) by mouth at bedtime. As needed 90 tablet 1   ipratropium (ATROVENT) 0.02 % nebulizer solution Take 2.5 mLs (0.5 mg total) by nebulization 4 (four) times daily. 100 mL 12   levothyroxine (SYNTHROID) 75 MCG tablet Take 1 tablet (75 mcg total) by mouth daily. 90 tablet 1   lisinopril (ZESTRIL) 5 MG tablet Take 5 mg by mouth daily.     metoprolol tartrate (LOPRESSOR) 50 MG tablet TAKE 1 TABLET(50 MG) BY MOUTH TWICE DAILY WITH MEALS 180 tablet 1   montelukast (SINGULAIR) 10 MG tablet TAKE 1  TABLET(10 MG) BY MOUTH AT BEDTIME 90 tablet 3   Multiple Vitamins-Minerals (MULTIVITAMIN WITH MINERALS) tablet Take 1 tablet by mouth daily.     nitroGLYCERIN (NITROSTAT) 0.4 MG SL tablet PLACE 1 TABLET UNDER THE TONGUE EVERY 5 MINUTES AS NEEDED FOR CHEST PAIN. 25 tablet 0  omeprazole (PRILOSEC) 40 MG capsule TAKE 1 CAPSULE(40 MG) BY MOUTH TWICE DAILY 180 capsule 0   oxyCODONE-acetaminophen (PERCOCET) 10-325 MG tablet Take by mouth.     tirzepatide (MOUNJARO) 10 MG/0.5ML Pen Inject 10 mg into the skin once a week. 6 mL 0   tretinoin (RETIN-A) 0.05 % cream tretinoin 0.05 % topical cream     vitamin E 180 MG (400 UNITS) capsule Take 400 Units by mouth daily. (Patient not taking: Reported on 02/08/2022)     amLODipine (NORVASC) 5 MG tablet Take by mouth.     amoxicillin-clavulanate (AUGMENTIN) 875-125 MG tablet Take 1 tablet by mouth 2 (two) times daily. 20 tablet 0   aspirin 81 MG chewable tablet Chew 81 mg by mouth daily.     estradiol (ESTRACE) 0.1 MG/GM vaginal cream Place vaginally.     phenazopyridine (PYRIDIUM) 100 MG tablet Take 1 tablet (100 mg total) by mouth 3 (three) times daily as needed for pain. 10 tablet 0   potassium chloride SA (KLOR-CON M) 20 MEQ tablet TAKE 1 TABLET BY MOUTH DAILY 30 tablet 3   triamcinolone acetonide (KENALOG-40) injection 40 mg      No facility-administered medications prior to visit.    Allergies  Allergen Reactions   Levofloxacin Hives, Shortness Of Breath and Swelling    RASH IN MOUTH   (can take IV route)   Bactrim [Sulfamethoxazole-Trimethoprim] Hives and Itching   Clarithromycin     GI upset Other reaction(s): GI Upset (intolerance) GI upset -can take but does cause mild upset    Lyrica [Pregabalin]    Macrobid [Nitrofurantoin]     Not tolerate   Repatha [Evolocumab]     Legs hurt.    Statins Other (See Comments)    Myalgias    Review of Systems  Constitutional:  Positive for fatigue. Negative for appetite change, chills and fever.   HENT:  Negative for congestion, ear pain, sinus pressure and sore throat.   Respiratory:  Negative for cough, chest tightness, shortness of breath and wheezing.   Cardiovascular:  Negative for chest pain and palpitations.  Gastrointestinal:  Negative for abdominal pain, constipation, diarrhea, nausea and vomiting.  Genitourinary:  Positive for dysuria, frequency and urgency. Negative for hematuria.  Musculoskeletal:  Positive for back pain (planned to get nerve ablation.). Negative for arthralgias, joint swelling and myalgias.  Skin:  Negative for rash.  Neurological:  Positive for weakness. Negative for dizziness and headaches.  Psychiatric/Behavioral:  Positive for dysphoric mood and sleep disturbance. The patient is nervous/anxious.        Objective:    Physical Exam Vitals reviewed.  Constitutional:      Appearance: Normal appearance. She is normal weight.  Neck:     Vascular: No carotid bruit.  Cardiovascular:     Rate and Rhythm: Normal rate and regular rhythm.     Heart sounds: Normal heart sounds.  Pulmonary:     Effort: Pulmonary effort is normal.     Breath sounds: Normal breath sounds.  Abdominal:     General: Abdomen is flat. Bowel sounds are normal.     Palpations: Abdomen is soft.     Tenderness: There is no abdominal tenderness.  Musculoskeletal:     Comments: Boot.  Neurological:     Mental Status: She is alert and oriented to person, place, and time.  Psychiatric:        Mood and Affect: Mood normal.        Behavior: Behavior normal.  BP 118/68 (BP Location: Left Arm, Patient Position: Sitting)   Pulse 84   Temp (!) 97.4 F (36.3 C) (Temporal)   Ht '5\' 5"'  (1.651 m)   Wt 144 lb (65.3 kg)   SpO2 96%   BMI 23.96 kg/m  Wt Readings from Last 3 Encounters:  02/08/22 144 lb (65.3 kg)  01/03/22 157 lb (71.2 kg)  12/07/21 157 lb (71.2 kg)    Health Maintenance Due  Topic Date Due   TETANUS/TDAP  Never done   Zoster Vaccines- Shingrix (1 of 2)  Never done   COVID-19 Vaccine (3 - Moderna risk series) 09/09/2019   COLONOSCOPY (Pts 45-55yr Insurance coverage will need to be confirmed)  12/01/2021   INFLUENZA VACCINE  01/18/2022    There are no preventive care reminders to display for this patient.   Lab Results  Component Value Date   TSH 2.270 02/08/2022   Lab Results  Component Value Date   WBC 7.7 02/08/2022   HGB 13.1 02/08/2022   HCT 40.8 02/08/2022   MCV 95 02/08/2022   PLT 297 02/08/2022   Lab Results  Component Value Date   NA 137 02/08/2022   K 5.1 02/08/2022   CO2 26 02/08/2022   GLUCOSE 88 02/08/2022   BUN 18 02/08/2022   CREATININE 1.02 (H) 02/08/2022   BILITOT 0.3 02/08/2022   ALKPHOS 51 02/08/2022   AST 22 02/08/2022   ALT 14 02/08/2022   PROT 7.2 02/08/2022   ALBUMIN 4.2 02/08/2022   CALCIUM 9.8 02/08/2022   ANIONGAP 9 09/04/2018   EGFR 57 (L) 02/08/2022   Lab Results  Component Value Date   CHOL 293 (H) 02/08/2022   Lab Results  Component Value Date   HDL 45 02/08/2022   Lab Results  Component Value Date   LDLCALC 206 (H) 02/08/2022   Lab Results  Component Value Date   TRIG 216 (H) 02/08/2022   Lab Results  Component Value Date   CHOLHDL 6.5 (H) 02/08/2022   Lab Results  Component Value Date   HGBA1C 5.3 02/08/2022         Assessment & Plan:   Problem List Items Addressed This Visit       Cardiovascular and Mediastinum   Coronary artery disease involving native coronary artery of native heart without angina pectoris    Continue zetia.  Continue metoprolol.  Management per specialist.          Endocrine   Secondary hypothyroidism    Well controlled. Continue Synthroid at current dose  Recheck TSH and adjust Synthroid as indicated        Relevant Orders   T4, free (Completed)   TSH (Completed)     Genitourinary   Acute cystitis without hematuria - Primary    Rx: cipro.       Relevant Medications   ciprofloxacin (CIPRO) 500 MG tablet   Other  Relevant Orders   POCT urinalysis dipstick (Completed)   Urine Culture (Completed)   Stage 3b chronic kidney disease ([place]    Continue to hydrate.  No nsaids.         Other   Mixed hyperlipidemia    Poorly controlled.  Recommend trial on nexlizet. Continue to work on eating a healthy diet and exercise.  Labs drawn today.        Relevant Orders   Lipid panel (Completed)   Myalgia due to statin    Intolerant to statins      Prediabetes    Recommend continue  to work on eating healthy diet and exercise.       Relevant Orders   Hemoglobin A1c (Completed)   Depression, major, recurrent, moderate (HCC)    The current medical regimen is effective;  continue present plan and medications. Continue prozac 20 mg daily.        Syncope    Secondary to hypotension. Recommend hydrate.      Relevant Orders   CBC with Differential/Platelet (Completed)   Comprehensive metabolic panel (Completed)   Total time spent on today's visit was greater than 30 minutes, including both face-to-face time and nonface-to-face time personally spent on review of chart (labs and imaging), discussing labs and goals, discussing further work-up, treatment options, referrals to specialist if needed, reviewing outside records of pertinent, answering patient's questions, and coordinating care.  Follow up:3 months fasting.  [name], MD

## 2022-02-09 LAB — COMPREHENSIVE METABOLIC PANEL
ALT: 14 IU/L (ref 0–32)
AST: 22 IU/L (ref 0–40)
Albumin/Globulin Ratio: 1.4 (ref 1.2–2.2)
Albumin: 4.2 g/dL (ref 3.8–4.8)
Alkaline Phosphatase: 51 IU/L (ref 44–121)
BUN/Creatinine Ratio: 18 (ref 12–28)
BUN: 18 mg/dL (ref 8–27)
Bilirubin Total: 0.3 mg/dL (ref 0.0–1.2)
CO2: 26 mmol/L (ref 20–29)
Calcium: 9.8 mg/dL (ref 8.7–10.3)
Chloride: 97 mmol/L (ref 96–106)
Creatinine, Ser: 1.02 mg/dL — ABNORMAL HIGH (ref 0.57–1.00)
Globulin, Total: 3 g/dL (ref 1.5–4.5)
Glucose: 88 mg/dL (ref 70–99)
Potassium: 5.1 mmol/L (ref 3.5–5.2)
Sodium: 137 mmol/L (ref 134–144)
Total Protein: 7.2 g/dL (ref 6.0–8.5)
eGFR: 57 mL/min/{1.73_m2} — ABNORMAL LOW (ref >59)

## 2022-02-09 LAB — CBC WITH DIFFERENTIAL/PLATELET
Basophils Absolute: 0.1 10*3/uL (ref 0.0–0.2)
Basos: 1 %
EOS (ABSOLUTE): 1.7 10*3/uL — ABNORMAL HIGH (ref 0.0–0.4)
Eos: 22 %
Hematocrit: 40.8 % (ref 34.0–46.6)
Hemoglobin: 13.1 g/dL (ref 11.1–15.9)
Immature Grans (Abs): 0 10*3/uL (ref 0.0–0.1)
Immature Granulocytes: 0 %
Lymphocytes Absolute: 1.8 10*3/uL (ref 0.7–3.1)
Lymphs: 23 %
MCH: 30.6 pg (ref 26.6–33.0)
MCHC: 32.1 g/dL (ref 31.5–35.7)
MCV: 95 fL (ref 79–97)
Monocytes Absolute: 0.6 10*3/uL (ref 0.1–0.9)
Monocytes: 8 %
Neutrophils Absolute: 3.4 10*3/uL (ref 1.4–7.0)
Neutrophils: 46 %
Platelets: 297 10*3/uL (ref 150–450)
RBC: 4.28 x10E6/uL (ref 3.77–5.28)
RDW: 12.3 % (ref 11.7–15.4)
WBC: 7.7 10*3/uL (ref 3.4–10.8)

## 2022-02-09 LAB — LIPID PANEL
Chol/HDL Ratio: 6.5 ratio — ABNORMAL HIGH (ref 0.0–4.4)
Cholesterol, Total: 293 mg/dL — ABNORMAL HIGH (ref 100–199)
HDL: 45 mg/dL (ref >39)
LDL Chol Calc (NIH): 206 mg/dL — ABNORMAL HIGH (ref 0–99)
Triglycerides: 216 mg/dL — ABNORMAL HIGH (ref 0–149)
VLDL Cholesterol Cal: 42 mg/dL — ABNORMAL HIGH (ref 5–40)

## 2022-02-09 LAB — HEMOGLOBIN A1C
Est. average glucose Bld gHb Est-mCnc: 105 mg/dL
Hgb A1c MFr Bld: 5.3 % (ref 4.8–5.6)

## 2022-02-09 LAB — T4, FREE: Free T4: 1.49 ng/dL (ref 0.82–1.77)

## 2022-02-09 LAB — TSH: TSH: 2.27 u[IU]/mL (ref 0.450–4.500)

## 2022-02-09 NOTE — Progress Notes (Signed)
Blood count normal.  Liver function normal.  Kidney function normal.  Thyroid function therapeutic Cholesterol: LDL 206. Very high. Trigs very high at 216. Hdl little low.  Please ask if patient has tried zetia. I would recommend nexlizet (if she can take zetia) or nexlitol if she cannot.  HBA1C: 5.3.

## 2022-02-10 ENCOUNTER — Other Ambulatory Visit: Payer: Self-pay

## 2022-02-10 DIAGNOSIS — E782 Mixed hyperlipidemia: Secondary | ICD-10-CM

## 2022-02-10 MED ORDER — EZETIMIBE 10 MG PO TABS: 10.0000 mg | ORAL_TABLET | Freq: Every day | ORAL | 3 refills | Status: DC

## 2022-02-12 LAB — URINE CULTURE

## 2022-02-13 NOTE — Assessment & Plan Note (Signed)
Continue to hydrate.  No nsaids.

## 2022-02-13 NOTE — Assessment & Plan Note (Signed)
The current medical regimen is effective;  continue present plan and medications. Continue prozac 20 mg daily.  

## 2022-02-13 NOTE — Assessment & Plan Note (Signed)
Secondary to hypotension. Recommend hydrate.

## 2022-02-13 NOTE — Assessment & Plan Note (Signed)
Intolerant to statins. 

## 2022-02-13 NOTE — Assessment & Plan Note (Signed)
Poorly controlled.  Recommend trial on nexlizet. Continue to work on eating a healthy diet and exercise.  Labs drawn today.

## 2022-02-13 NOTE — Assessment & Plan Note (Addendum)
Well controlled. Continue Synthroid at current dose  Recheck TSH and adjust Synthroid as indicated

## 2022-02-13 NOTE — Assessment & Plan Note (Signed)
Recommend continue to work on eating healthy diet and exercise.  

## 2022-02-13 NOTE — Assessment & Plan Note (Signed)
Continue zetia.  Continue metoprolol.  Management per specialist.

## 2022-02-13 NOTE — Assessment & Plan Note (Signed)
Rx: cipro.

## 2022-02-14 ENCOUNTER — Other Ambulatory Visit: Payer: Self-pay

## 2022-02-17 NOTE — Progress Notes (Signed)
Samples of Nexlizet 2 boxes were given to her daughter.

## 2022-02-24 DIAGNOSIS — S8264XA Nondisplaced fracture of lateral malleolus of right fibula, initial encounter for closed fracture: Secondary | ICD-10-CM | POA: Diagnosis not present

## 2022-03-02 ENCOUNTER — Ambulatory Visit (INDEPENDENT_AMBULATORY_CARE_PROVIDER_SITE_OTHER): Payer: Medicare Other | Admitting: Nurse Practitioner

## 2022-03-02 ENCOUNTER — Encounter: Payer: Self-pay | Admitting: Nurse Practitioner

## 2022-03-02 VITALS — BP 136/76 | HR 71 | Temp 97.1°F | Ht 60.0 in | Wt 154.0 lb

## 2022-03-02 DIAGNOSIS — M545 Low back pain, unspecified: Secondary | ICD-10-CM

## 2022-03-02 DIAGNOSIS — N390 Urinary tract infection, site not specified: Secondary | ICD-10-CM | POA: Diagnosis not present

## 2022-03-02 LAB — POCT URINALYSIS DIP (CLINITEK)
Bilirubin, UA: NEGATIVE
Blood, UA: NEGATIVE
Glucose, UA: NEGATIVE mg/dL
Ketones, POC UA: NEGATIVE mg/dL
Leukocytes, UA: NEGATIVE
Nitrite, UA: NEGATIVE
POC PROTEIN,UA: NEGATIVE
Spec Grav, UA: 1.01 (ref 1.010–1.025)
Urobilinogen, UA: 0.2 E.U./dL
pH, UA: 7 (ref 5.0–8.0)

## 2022-03-02 MED ORDER — AMOXICILLIN-POT CLAVULANATE 875-125 MG PO TABS: 1.0000 | ORAL_TABLET | Freq: Two times a day (BID) | ORAL | 0 refills | Status: DC

## 2022-03-02 NOTE — Progress Notes (Signed)
Acute Office Visit  Subjective:    Patient ID: Courtney [name], female    DOB: [birth year], 76 y.o.   MRN: [id number]  Chief Complaint  Patient presents with   Urinary Tract Infection    HPI: Patient is in today for Urinary symptoms  She reports new onset urinary frequency, urgency, and strong urine odor. The current episode started a few days ago and is worsening. Treatment has included pushing fluids, AZO OTC. Denies fever, chills, or body aches. Patient states symptoms are mild in intensity, occurring intermittently. She has been recently treated for similar symptoms. 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       Past Medical History:  Diagnosis Date   Anemia    Arthritis    Bronchiectasis ([place])    CAP (community acquired pneumonia)  vs Eosinophilic Pna 08/26/1015   Followed in Pulmonary clinic/ [facility]/ [facility]    Cardiac dysrhythmia    Chronic idiopathic constipation    Colitis, acute 02/08/2021   Depression, major, recurrent, moderate (HCC)    Headache    Heart murmur    History of bladder infections    History of COVID-19    Hyperlipidemia    Hypertension    Hypothyroidism    Knee pain, bilateral    Melena 02/08/2021   Osteoarthritis    Pneumonia    Primary insomnia    Recovering alcoholic (HCC)    SVT (supraventricular tachycardia) (HCC)    UTI (urinary tract infection)    Varicose veins    Vitamin B12 deficiency     Past Surgical History:  Procedure Laterality Date   ABDOMINAL HYSTERECTOMY  1991   ANGIOPLASTY     at least 15 years ago, pt. denies    APPENDECTOMY     AUGMENTATION MAMMAPLASTY Bilateral    BACK SURGERY     had 2 surgeries. Have rods placed in back    BARIATRIC SURGERY     lap band-at least 14 years ago   BREAST BIOPSY Left    x2   BREAST ENHANCEMENT SURGERY  2006   CHOLECYSTECTOMY  12/2019   removed lap band.    COLONOSCOPY  12/01/2016   Moderate predominantly sigmod diverticulosis. Otherwise grossly normal  colonoscopy   EYE SURGERY     IOL- bilateral - [place]   KNEE ARTHROSCOPY Left    lap band surgery  1981   LUMBAR FUSION  07/29/2015   posterior level one   removal of cervical disc fragments  10/2007   pt. denies     Family History  Problem Relation Age of Onset   Heart disease Mother    Heart disease Father    Diabetes Sister    Other Sister        BRAIN TUMOR   Heart disease Brother    Heart disease Brother    Heart disease Brother    Heart disease Brother    Heart disease Brother    Colon cancer Maternal Aunt    Colon cancer Cousin     Social History   Socioeconomic History   Marital status: Married    Spouse name: Not on file   Number of children: 2   Years of education: Not on file   Highest education level: Not on file  Occupational History   Occupation: retired    Comment: nurse  Tobacco Use   Smoking status: Never   Smokeless tobacco: Never  Vaping Use   Vaping Use: Never used  Substance and  Sexual Activity   Alcohol use: No    Alcohol/week: 0.0 standard drinks of alcohol    Comment: recovery x 20 yrs   Drug use: No   Sexual activity: Not Currently    Comment: MARRIED  Other Topics Concern   Not on file  Social History Narrative   Not on file   Social Determinants of Health   Financial Resource Strain: Medium Risk (11/08/2021)   Overall Financial Resource Strain (CARDIA)    Difficulty of Paying Living Expenses: Somewhat hard  Food Insecurity: No Food Insecurity (11/18/2020)   Hunger Vital Sign    Worried About Running Out of Food in the Last Year: Never true    Ran Out of Food in the Last Year: Never true  Transportation Needs: No Transportation Needs (11/08/2021)   PRAPARE - Hydrologist (Medical): No    Lack of Transportation (Non-Medical): No  Physical Activity: Not on file  Stress: Not on file  Social Connections: Not on file  Intimate Partner Violence: Not on file    Outpatient Medications Prior to Visit   Medication Sig Dispense Refill   albuterol (VENTOLIN HFA) 108 (90 Base) MCG/ACT inhaler Inhale into the lungs.     bell[place]ona alk-PHENObarbital (DONNATAL) 16.2 MG tablet Take 1 tablet by mouth every 8 (eight) hours as needed. 60 tablet 1   budesonide-formoterol (SYMBICORT) 160-4.5 MCG/ACT inhaler Inhale 2 puffs into the lungs in the morning and at bedtime. 1 each 6   cetirizine (ZYRTEC) 10 MG tablet Take 1 tablet (10 mg total) by mouth daily. 90 tablet 3   Cholecalciferol (D3-50 PO) Take by mouth.     ezetimibe (ZETIA) 10 MG tablet Take 1 tablet (10 mg total) by mouth daily. 90 tablet 3   FLUoxetine (PROZAC) 20 MG capsule Take 1 capsule (20 mg total) by mouth daily. 90 capsule 3   furosemide (LASIX) 20 MG tablet TAKE 1 TABLET(20 MG) BY MOUTH DAILY 90 tablet 0   gabapentin (NEURONTIN) 600 MG tablet Take 1 tablet (600 mg total) by mouth at bedtime. As needed 90 tablet 1   ipratropium (ATROVENT) 0.02 % nebulizer solution Take 2.5 mLs (0.5 mg total) by nebulization 4 (four) times daily. 100 mL 12   levothyroxine (SYNTHROID) 75 MCG tablet Take 1 tablet (75 mcg total) by mouth daily. 90 tablet 1   lisinopril (ZESTRIL) 5 MG tablet Take 5 mg by mouth daily.     metoprolol tartrate (LOPRESSOR) 50 MG tablet TAKE 1 TABLET(50 MG) BY MOUTH TWICE DAILY WITH MEALS 180 tablet 1   montelukast (SINGULAIR) 10 MG tablet TAKE 1 TABLET(10 MG) BY MOUTH AT BEDTIME 90 tablet 3   Multiple Vitamins-Minerals (MULTIVITAMIN WITH MINERALS) tablet Take 1 tablet by mouth daily.     nitroGLYCERIN (NITROSTAT) 0.4 MG SL tablet PLACE 1 TABLET UNDER THE TONGUE EVERY 5 MINUTES AS NEEDED FOR CHEST PAIN. 25 tablet 0   omeprazole (PRILOSEC) 40 MG capsule TAKE 1 CAPSULE(40 MG) BY MOUTH TWICE DAILY 180 capsule 0   oxyCODONE-acetaminophen (PERCOCET) 10-325 MG tablet Take by mouth.     tirzepatide (MOUNJARO) 10 MG/0.5ML Pen Inject 10 mg into the skin once a week. 6 mL 0   tretinoin (RETIN-A) 0.05 % cream tretinoin 0.05 % topical cream      vitamin E 180 MG (400 UNITS) capsule Take 400 Units by mouth daily. (Patient not taking: Reported on 02/08/2022)     No facility-administered medications prior to visit.    Allergies  Allergen Reactions  Levofloxacin Hives, Shortness Of Breath, Swelling and Other (See Comments)    RASH IN MOUTH   (can take IV route)   Bactrim [Sulfamethoxazole-Trimethoprim] Hives and Itching   Clarithromycin     GI upset Other reaction(s): GI Upset (intolerance) GI upset -can take but does cause mild upset    Lyrica [Pregabalin]    Macrobid [Nitrofurantoin]     Not tolerate   Other Other (See Comments)   Repatha [Evolocumab]     Legs hurt.    Statins Other (See Comments)    Myalgias    Review of Systems See pertinent positives and negatives per HPI.     Objective:    Physical Exam Vitals reviewed.  Constitutional:      Appearance: Normal appearance.  Abdominal:     General: Bowel sounds are normal.     Tenderness: There is no abdominal tenderness. There is no right CVA tenderness or left CVA tenderness.  Skin:    General: Skin is warm and dry.     Capillary Refill: Capillary refill takes less than 2 seconds.  Neurological:     General: No focal deficit present.     Mental Status: She is alert and oriented to person, place, and time.  Psychiatric:        Mood and Affect: Mood normal.        Behavior: Behavior normal.    Wt Readings from Last 3 Encounters:  03/02/22 154 lb (69.9 kg)  02/08/22 144 lb (65.3 kg)  01/03/22 157 lb (71.2 kg)    Health Maintenance Due  Topic Date Due   TETANUS/TDAP  Never done   Zoster Vaccines- Shingrix (1 of 2) Never done   COVID-19 Vaccine (3 - Moderna risk series) 09/09/2019   COLONOSCOPY (Pts 45-40yr Insurance coverage will need to be confirmed)  12/01/2021   INFLUENZA VACCINE  01/18/2022   Lab Results  Component Value Date   TSH 2.270 02/08/2022   Lab Results  Component Value Date   WBC 7.7 02/08/2022   HGB 13.1 02/08/2022   HCT 40.8  02/08/2022   MCV 95 02/08/2022   PLT 297 02/08/2022   Lab Results  Component Value Date   NA 137 02/08/2022   K 5.1 02/08/2022   CO2 26 02/08/2022   GLUCOSE 88 02/08/2022   BUN 18 02/08/2022   CREATININE 1.02 (H) 02/08/2022   BILITOT 0.3 02/08/2022   ALKPHOS 51 02/08/2022   AST 22 02/08/2022   ALT 14 02/08/2022   PROT 7.2 02/08/2022   ALBUMIN 4.2 02/08/2022   CALCIUM 9.8 02/08/2022   ANIONGAP 9 09/04/2018   EGFR 57 (L) 02/08/2022   Lab Results  Component Value Date   CHOL 293 (H) 02/08/2022   Lab Results  Component Value Date   HDL 45 02/08/2022   Lab Results  Component Value Date   LDLCALC 206 (H) 02/08/2022   Lab Results  Component Value Date   TRIG 216 (H) 02/08/2022   Lab Results  Component Value Date   CHOLHDL 6.5 (H) 02/08/2022   Lab Results  Component Value Date   HGBA1C 5.3 02/08/2022       Assessment & Plan:   1. Chronic UTI - amoxicillin-clavulanate (AUGMENTIN) 875-125 MG tablet; Take 1 tablet by mouth 2 (two) times daily.  Dispense: 20 tablet; Refill: 0  2. Acute low back pain without sciatica, unspecified back pain laterality - POCT URINALYSIS DIP (CLINITEK)   Push fluids, especially water Take Augmentin twice daily for 10 days Avoid holding  urine Use Dove bar soap Follow-up with urology as scheduled Follow-up as needed     Follow-up:   An After Visit Summary was printed and given to the patient.  I, Rip Harbour, NP, have reviewed all documentation for this visit. The documentation on 03/02/22 for the exam, diagnosis, procedures, and orders are all accurate and complete.    Signed, Rip Harbour, NP [place] [phone]

## 2022-03-02 NOTE — Patient Instructions (Addendum)
Push fluids, especially water Take Augmentin twice daily for 10 days Avoid holding urine Use Dove bar soap Follow-up with urology as scheduled Follow-up as needed   Urinary Tract Infection, Adult A urinary tract infection (UTI) is an infection of any part of the urinary tract. The urinary tract includes: The kidneys. The ureters. The bladder. The urethra. These organs make, store, and get rid of pee (urine) in the body. What are the causes? This infection is caused by germs (bacteria) in your genital area. These germs grow and cause swelling (inflammation) of your urinary tract. What increases the risk? The following factors may make you more likely to develop this condition: Using a small, thin tube (catheter) to drain pee. Not being able to control when you pee or poop (incontinence). Being female. If you are female, these things can increase the risk: Using these methods to prevent pregnancy: A medicine that kills sperm (spermicide). A device that blocks sperm (diaphragm). Having low levels of a female hormone (estrogen). Being pregnant. You are more likely to develop this condition if: You have genes that add to your risk. You are sexually active. You take antibiotic medicines. You have trouble peeing because of: A prostate that is bigger than normal, if you are female. A blockage in the part of your body that drains pee from the bladder. A kidney stone. A nerve condition that affects your bladder. Not getting enough to drink. Not peeing often enough. You have other conditions, such as: Diabetes. A weak disease-fighting system (immune system). Sickle cell disease. Gout. Injury of the spine. What are the signs or symptoms? Symptoms of this condition include: Needing to pee right away. Peeing small amounts often. Pain or burning when peeing. Blood in the pee. Pee that smells bad or not like normal. Trouble peeing. Pee that is cloudy. Fluid coming from the vagina,  if you are female. Pain in the belly or lower back. Other symptoms include: Vomiting. Not feeling hungry. Feeling mixed up (confused). This may be the first symptom in older adults. Being tired and grouchy (irritable). A fever. Watery poop (diarrhea). How is this treated? Taking antibiotic medicine. Taking other medicines. Drinking enough water. In some cases, you may need to see a specialist. Follow these instructions at home:  Medicines Take over-the-counter and prescription medicines only as told by your doctor. If you were prescribed an antibiotic medicine, take it as told by your doctor. Do not stop taking it even if you start to feel better. General instructions Make sure you: Pee until your bladder is empty. Do not hold pee for a long time. Empty your bladder after sex. Wipe from front to back after peeing or pooping if you are a female. Use each tissue one time when you wipe. Drink enough fluid to keep your pee pale yellow. Keep all follow-up visits. Contact a doctor if: You do not get better after 1-2 days. Your symptoms go away and then come back. Get help right away if: You have very bad back pain. You have very bad pain in your lower belly. You have a fever. You have chills. You feeling like you will vomit or you vomit. Summary A urinary tract infection (UTI) is an infection of any part of the urinary tract. This condition is caused by germs in your genital area. There are many risk factors for a UTI. Treatment includes antibiotic medicines. Drink enough fluid to keep your pee pale yellow. This information is not intended to replace advice given to you  by your health care provider. Make sure you discuss any questions you have with your health care provider. Document Revised: 01/17/2020 Document Reviewed: 01/17/2020 Elsevier Patient Education  [place].

## 2022-03-07 ENCOUNTER — Telehealth: Payer: Medicare Other

## 2022-03-14 DIAGNOSIS — M47816 Spondylosis without myelopathy or radiculopathy, lumbar region: Secondary | ICD-10-CM | POA: Diagnosis not present

## 2022-03-15 ENCOUNTER — Telehealth: Payer: Medicare Other

## 2022-03-15 DIAGNOSIS — S8264XA Nondisplaced fracture of lateral malleolus of right fibula, initial encounter for closed fracture: Secondary | ICD-10-CM | POA: Diagnosis not present

## 2022-03-16 ENCOUNTER — Other Ambulatory Visit: Payer: Self-pay | Admitting: Family Medicine

## 2022-03-17 DIAGNOSIS — N3020 Other chronic cystitis without hematuria: Secondary | ICD-10-CM | POA: Diagnosis not present

## 2022-03-17 DIAGNOSIS — R8271 Bacteriuria: Secondary | ICD-10-CM | POA: Diagnosis not present

## 2022-03-23 ENCOUNTER — Telehealth: Payer: Self-pay

## 2022-03-23 ENCOUNTER — Other Ambulatory Visit: Payer: Medicare Other

## 2022-03-23 NOTE — Telephone Encounter (Signed)
Patient has called stating that she started feeling like she did when she passed out and fell and broke her leg. She stated she wanted to call and see if we wanted to see her and do some blood work or is there anything else we want to check she specifically stated that she is seeing [facility] and did see them after she seen [place] last and they treated her again for a UTI which she follows up with them next week. Dr. Tobie Poet was consulted with about the patient and the situation and stated we have no openings today with no provider, patient will need to go to the nearest Urgent Care. Called patient to inform her of the response and she stated all she was needing was blood work, but I informed patient she needed to be seen by some one which is was Dr. Tobie Poet recommended today. Patient understood verbally.

## 2022-03-28 DIAGNOSIS — M47816 Spondylosis without myelopathy or radiculopathy, lumbar region: Secondary | ICD-10-CM | POA: Diagnosis not present

## 2022-03-31 DIAGNOSIS — N3020 Other chronic cystitis without hematuria: Secondary | ICD-10-CM | POA: Diagnosis not present

## 2022-03-31 DIAGNOSIS — N952 Postmenopausal atrophic vaginitis: Secondary | ICD-10-CM | POA: Diagnosis not present

## 2022-04-07 ENCOUNTER — Telehealth: Payer: Self-pay

## 2022-04-07 ENCOUNTER — Telehealth: Payer: Medicare Other

## 2022-04-07 NOTE — Telephone Encounter (Signed)
  Care Management   Follow Up Note   04/07/2022 Name: Courtney [name] MRN: [id number] DOB: 18-Sep-1945   Referred by: [name], MD Reason for referral : Chronic Care Management  RECOMMENDATIONS FOR PCP: -Patient last DEXA scan 2019, recommend repeat -Patient has had a UTI treated on  03/02/22 02/08/22 01/03/22 12/02/21 10/20/21 Could we try Cephalexin '125mg'$  QD (Can't use others due to allergies)  An unsuccessful telephone outreach was attempted today. The patient was referred to the case management team for assistance with care management and care coordination.   Follow Up Plan: The patient has been provided with contact information for the care management team and has been advised to call with any health related questions or concerns.   [place] Constable, Pharm.D. - [phone]

## 2022-04-19 DIAGNOSIS — L650 Telogen effluvium: Secondary | ICD-10-CM | POA: Diagnosis not present

## 2022-04-24 NOTE — Assessment & Plan Note (Signed)
Well controlled.  No changes to medicines. Metoprolol tartrate 50 mg twice daily, on lisinopril 5 mg daily. Continue to work on eating a healthy diet and exercise.  Labs drawn today.

## 2022-04-24 NOTE — Assessment & Plan Note (Signed)
Hemoglobin A1c 5.3%, 3 month avg of blood sugars, is in prediabetic range.  In order to prevent progression to diabetes, recommend low carb diet and regular exercise

## 2022-04-24 NOTE — Assessment & Plan Note (Signed)
Previously well controlled Continue Synthroid at current dose  Recheck TSH and adjust Synthroid as indicated   

## 2022-04-24 NOTE — Assessment & Plan Note (Signed)
Management per specialist The current medical regimen is effective;  continue present plan and medications.  

## 2022-04-24 NOTE — Assessment & Plan Note (Signed)
Check labs 

## 2022-04-24 NOTE — Progress Notes (Unsigned)
Subjective:  Patient ID: Courtney [name], female    DOB: 12/20/1945  Age: 76 y.o. MRN: [id number]  Chief Complaint  Patient presents with   Hyperlipidemia   Hypertension    HPI Prediabetes: Patient is eating low carbs diet. Last a1c 5.3%. Mounjoro 7.5 mg once a week.   Hyperlipidemia: Intolerant to Zetia 10 mg daily, nexlizet, and statins. She is eating healthy ( low sugar and low fat diet.) Took repatha for one year and then started having worsening leg pain and discontinued it.   Hypothyroidism: She takes levothyroxine 75 mcg daily.   GERD: Taking Omeprazole 40 mg daily.  CORONARY ARTERY DISEASE: Metoprolol tartrate 50 mg twice daily, on lisinopril 5 mg daily. Lasix 20 mg once daily. Cardiology stopped potassium and aspirin at her most recent visit.  Patient sees Dr. Philbert Riser.    Patient has had recurrent UTIs. On cipro twice daily x 7 days started last Friday.  On trimethoprim 100 mg once daily in am.   Current Outpatient Medications on File Prior to Visit  Medication Sig Dispense Refill   albuterol (VENTOLIN HFA) 108 (90 Base) MCG/ACT inhaler Inhale into the lungs.     budesonide-formoterol (SYMBICORT) 160-4.5 MCG/ACT inhaler Inhale 2 puffs into the lungs in the morning and at bedtime. 1 each 6   cetirizine (ZYRTEC) 10 MG tablet Take 1 tablet (10 mg total) by mouth daily. 90 tablet 3   Cholecalciferol (D3-50 PO) Take by mouth.     ciprofloxacin (CIPRO) 500 MG tablet Take 500 mg by mouth 2 (two) times daily as needed.     dutasteride (AVODART) 0.5 MG capsule Take 0.5 mg by mouth daily.     furosemide (LASIX) 20 MG tablet TAKE 1 TABLET(20 MG) BY MOUTH DAILY 90 tablet 0   gabapentin (NEURONTIN) 600 MG tablet Take 1 tablet (600 mg total) by mouth at bedtime. As needed 90 tablet 1   ipratropium (ATROVENT) 0.02 % nebulizer solution Take 2.5 mLs (0.5 mg total) by nebulization 4 (four) times daily. 100 mL 12   levothyroxine (SYNTHROID) 75 MCG tablet Take 1 tablet (75 mcg total) by mouth  daily. 90 tablet 1   metoprolol tartrate (LOPRESSOR) 50 MG tablet TAKE 1 TABLET(50 MG) BY MOUTH TWICE DAILY WITH MEALS 180 tablet 1   Multiple Vitamins-Minerals (MULTIVITAMIN WITH MINERALS) tablet Take 1 tablet by mouth daily.     oxyCODONE-acetaminophen (PERCOCET) 10-325 MG tablet Take by mouth.     tretinoin (RETIN-A) 0.05 % cream tretinoin 0.05 % topical cream     trimethoprim (TRIMPEX) 100 MG tablet Take 100 mg by mouth at bedtime.     vitamin E 180 MG (400 UNITS) capsule Take 400 Units by mouth daily.     No current facility-administered medications on file prior to visit.   Past Medical History:  Diagnosis Date   Anemia    Arthritis    Bronchiectasis ([place])    CAP (community acquired pneumonia)  vs Eosinophilic Pna 02/25/3531   Followed in Pulmonary clinic/ [facility]/ [place]    Cardiac dysrhythmia    Chronic idiopathic constipation    Colitis, acute 02/08/2021   Depression, major, recurrent, moderate (HCC)    Headache    Heart murmur    History of bladder infections    History of COVID-19    Hyperlipidemia    Hypertension    Hypothyroidism    Knee pain, bilateral    Melena 02/08/2021   Osteoarthritis    Pneumonia    Primary insomnia  Recovering alcoholic (HCC)    SVT (supraventricular tachycardia) (HCC)    UTI (urinary tract infection)    Varicose veins    Vitamin B12 deficiency    Past Surgical History:  Procedure Laterality Date   ABDOMINAL HYSTERECTOMY  1991   ANGIOPLASTY     at least 15 years ago, pt. denies    APPENDECTOMY     AUGMENTATION MAMMAPLASTY Bilateral    BACK SURGERY     had 2 surgeries. Have rods placed in back    BARIATRIC SURGERY     lap band-at least 14 years ago   BREAST BIOPSY Left    x2   BREAST ENHANCEMENT SURGERY  2006   CHOLECYSTECTOMY  12/2019   removed lap band.    COLONOSCOPY  12/01/2016   Moderate predominantly sigmod diverticulosis. Otherwise grossly normal colonoscopy   EYE SURGERY     IOL- bilateral - [place]    KNEE ARTHROSCOPY Left    lap band surgery  1981   LUMBAR FUSION  07/29/2015   posterior level one   removal of cervical disc fragments  10/2007   pt. denies     Family History  Problem Relation Age of Onset   Heart disease Mother    Heart disease Father    Diabetes Sister    Other Sister        BRAIN TUMOR   Heart disease Brother    Heart disease Brother    Heart disease Brother    Heart disease Brother    Heart disease Brother    Colon cancer Maternal Aunt    Colon cancer Cousin    Social History   Socioeconomic History   Marital status: Married    Spouse name: Not on file   Number of children: 2   Years of education: Not on file   Highest education level: Not on file  Occupational History   Occupation: retired    Comment: Marine scientist  Tobacco Use   Smoking status: Never   Smokeless tobacco: Never  Vaping Use   Vaping Use: Never used  Substance and Sexual Activity   Alcohol use: No    Alcohol/week: 0.0 standard drinks of alcohol    Comment: recovery x 20 yrs   Drug use: No   Sexual activity: Not Currently    Comment: MARRIED  Other Topics Concern   Not on file  Social History Narrative   Not on file   Social Determinants of Health   Financial Resource Strain: Medium Risk (11/08/2021)   Overall Financial Resource Strain (CARDIA)    Difficulty of Paying Living Expenses: Somewhat hard  Food Insecurity: No Food Insecurity (11/18/2020)   Hunger Vital Sign    Worried About Running Out of Food in the Last Year: Never true    Ran Out of Food in the Last Year: Never true  Transportation Needs: No Transportation Needs (11/08/2021)   PRAPARE - Hydrologist (Medical): No    Lack of Transportation (Non-Medical): No  Physical Activity: Not on file  Stress: Not on file  Social Connections: Not on file    Review of Systems  Constitutional:  Positive for fatigue. Negative for chills and fever.  HENT:  Negative for congestion, ear pain,  nosebleeds, sinus pain and sore throat.   Respiratory:  Negative for cough and shortness of breath.   Cardiovascular:  Positive for leg swelling. Negative for chest pain and palpitations.  Gastrointestinal:  Positive for constipation. Negative for abdominal pain, diarrhea,  nausea and vomiting.  Endocrine: Negative for polydipsia, polyphagia and polyuria.  Genitourinary:  Negative for difficulty urinating, dysuria and frequency.  Musculoskeletal:  Positive for back pain. Negative for arthralgias and myalgias.  Skin:  Negative for rash.  Neurological:  Positive for dizziness. Negative for headaches.  Psychiatric/Behavioral:  Negative for dysphoric mood. The patient is not nervous/anxious.      Objective:  BP 122/60   Pulse (!) 16   Temp (!) 97.4 F (36.3 C)   Ht '5\' 5"'$  (1.651 m)   Wt 154 lb (69.9 kg)   BMI 25.63 kg/m      04/25/2022    8:07 AM 03/02/2022    3:37 PM 02/08/2022    8:58 AM  BP/Weight  Systolic BP 427 062 376  Diastolic BP 60 76 68  Wt. (Lbs) 154 154 144  BMI 25.63 kg/m2 30.08 kg/m2 23.96 kg/m2    Physical Exam Vitals reviewed.  Constitutional:      Appearance: Normal appearance. She is normal weight.  Neck:     Vascular: No carotid bruit.  Cardiovascular:     Rate and Rhythm: Normal rate and regular rhythm.     Heart sounds: Normal heart sounds.  Pulmonary:     Effort: Pulmonary effort is normal. No respiratory distress.     Breath sounds: Normal breath sounds.  Abdominal:     General: Abdomen is flat. Bowel sounds are normal.     Palpations: Abdomen is soft.     Tenderness: There is no abdominal tenderness.  Neurological:     Mental Status: She is alert and oriented to person, place, and time.  Psychiatric:        Mood and Affect: Mood normal.        Behavior: Behavior normal.     Diabetic Foot Exam - Simple   No data filed      Lab Results  Component Value Date   WBC 7.7 02/08/2022   HGB 13.1 02/08/2022   HCT 40.8 02/08/2022   PLT 297  02/08/2022   GLUCOSE 88 02/08/2022   CHOL 293 (H) 02/08/2022   TRIG 216 (H) 02/08/2022   HDL 45 02/08/2022   LDLCALC 206 (H) 02/08/2022   ALT 14 02/08/2022   AST 22 02/08/2022   NA 137 02/08/2022   K 5.1 02/08/2022   CL 97 02/08/2022   CREATININE 1.02 (H) 02/08/2022   BUN 18 02/08/2022   CO2 26 02/08/2022   TSH 2.270 02/08/2022   HGBA1C 5.3 02/08/2022      Assessment & Plan:   Problem List Items Addressed This Visit       Cardiovascular and Mediastinum   Coronary artery disease involving native coronary artery of native heart without angina pectoris    Management per specialist. The current medical regimen is effective;  continue present plan and medications.       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  No changes to medicines. Metoprolol tartrate 50 mg twice daily, on lisinopril 5 mg daily. Continue to work on eating a healthy diet and exercise.  Labs drawn today.        Relevant Medications   nitroGLYCERIN (NITROSTAT) 0.4 MG SL tablet   Other Relevant Orders   Comprehensive metabolic panel   CBC with Differential/Platelet     Endocrine   Secondary hypothyroidism    Previously well controlled Continue Synthroid at current dose  Recheck TSH  and adjust Synthroid as indicated          Genitourinary   Stage 3b chronic kidney disease ([place])    Check cmp        Other   Mixed hyperlipidemia - Primary    Poorly controlled. Currently on no medicines due to Ses. Patient may be willing to retry repatha Continue to work on eating a healthy diet and exercise.  Labs drawn today.        Relevant Medications   nitroGLYCERIN (NITROSTAT) 0.4 MG SL tablet   Other Relevant Orders   Lipid panel   Prediabetes    Hemoglobin A1c 5.3%, 3 month avg of blood sugars, is in prediabetic range.  In order to prevent progression to diabetes, recommend low carb diet and regular  exercise       Relevant Medications   tirzepatide (MOUNJARO) 7.5 MG/0.5ML Pen   Mild recurrent major depression ([place])    Prefers no medicine at this time.       Vitamin D insufficiency    Check labs.      Pernicious anemia   Relevant Orders   B12 and Folate Panel   Methylmalonic acid, serum   Other Visit Diagnoses     Colon cancer screening       Relevant Orders   Cologuard     .  Meds ordered this encounter  Medications   nitroGLYCERIN (NITROSTAT) 0.4 MG SL tablet    Sig: PLACE 1 TABLET UNDER THE TONGUE EVERY 5 MINUTES AS NEEDED FOR CHEST PAIN.    Dispense:  25 tablet    Refill:  1   omeprazole (PRILOSEC) 40 MG capsule    Sig: Take 1 capsule (40 mg total) by mouth daily.    Dispense:  90 capsule    Refill:  1   tirzepatide (MOUNJARO) 7.5 MG/0.5ML Pen    Sig: Inject 7.5 mg into the skin once a week.    Dispense:  6 mL    Refill:  0    Orders Placed This Encounter  Procedures   Comprehensive metabolic panel   Lipid panel   CBC with Differential/Platelet   B12 and Folate Panel   Methylmalonic acid, serum   Cologuard     Follow-up: Return in about 3 months (around 07/26/2022) for chronic fasting.  An After Visit Summary was printed and given to the patient.  Thompson Caul, acting as a Education administrator for [name], MD.,have documented all relevant documentation on the behalf of [name], MD,as directed by  [name], MD while in the presence of [name], MD.   [name], MD [place] [phone]

## 2022-04-24 NOTE — Assessment & Plan Note (Signed)
The current medical regimen is effective;  continue present plan and medications.  Fluoxetine 20 mg daily.

## 2022-04-24 NOTE — Assessment & Plan Note (Addendum)
Poorly controlled. Currently on no medicines due to Ses. Patient may be willing to retry repatha Continue to work on eating a healthy diet and exercise.  Labs drawn today.

## 2022-04-25 ENCOUNTER — Encounter: Payer: Self-pay | Admitting: Family Medicine

## 2022-04-25 ENCOUNTER — Ambulatory Visit (INDEPENDENT_AMBULATORY_CARE_PROVIDER_SITE_OTHER): Payer: Medicare Other | Admitting: Family Medicine

## 2022-04-25 VITALS — BP 122/60 | HR 16 | Temp 97.4°F | Ht 65.0 in | Wt 154.0 lb

## 2022-04-25 DIAGNOSIS — E559 Vitamin D deficiency, unspecified: Secondary | ICD-10-CM

## 2022-04-25 DIAGNOSIS — N1832 Chronic kidney disease, stage 3b: Secondary | ICD-10-CM

## 2022-04-25 DIAGNOSIS — F331 Major depressive disorder, recurrent, moderate: Secondary | ICD-10-CM

## 2022-04-25 DIAGNOSIS — R7303 Prediabetes: Secondary | ICD-10-CM

## 2022-04-25 DIAGNOSIS — I2510 Atherosclerotic heart disease of native coronary artery without angina pectoris: Secondary | ICD-10-CM

## 2022-04-25 DIAGNOSIS — Z23 Encounter for immunization: Secondary | ICD-10-CM | POA: Diagnosis not present

## 2022-04-25 DIAGNOSIS — F330 Major depressive disorder, recurrent, mild: Secondary | ICD-10-CM

## 2022-04-25 DIAGNOSIS — I119 Hypertensive heart disease without heart failure: Secondary | ICD-10-CM

## 2022-04-25 DIAGNOSIS — E782 Mixed hyperlipidemia: Secondary | ICD-10-CM | POA: Diagnosis not present

## 2022-04-25 DIAGNOSIS — E038 Other specified hypothyroidism: Secondary | ICD-10-CM

## 2022-04-25 DIAGNOSIS — D510 Vitamin B12 deficiency anemia due to intrinsic factor deficiency: Secondary | ICD-10-CM | POA: Diagnosis not present

## 2022-04-25 DIAGNOSIS — Z1211 Encounter for screening for malignant neoplasm of colon: Secondary | ICD-10-CM | POA: Diagnosis not present

## 2022-04-25 MED ORDER — NITROGLYCERIN 0.4 MG SL SUBL: SUBLINGUAL_TABLET | SUBLINGUAL | 1 refills | Status: DC

## 2022-04-25 MED ORDER — TIRZEPATIDE 7.5 MG/0.5ML ~~LOC~~ SOAJ: 7.5000 mg | SUBCUTANEOUS | 0 refills | Status: DC

## 2022-04-25 MED ORDER — OMEPRAZOLE 40 MG PO CPDR: 40.0000 mg | DELAYED_RELEASE_CAPSULE | Freq: Every day | ORAL | 1 refills | Status: DC

## 2022-04-25 NOTE — Patient Instructions (Signed)
Recommend tetanus (TDAP) and Shingrix series.   Order cologuard.

## 2022-04-25 NOTE — Assessment & Plan Note (Signed)
Check cmp 

## 2022-04-26 DIAGNOSIS — M19011 Primary osteoarthritis, right shoulder: Secondary | ICD-10-CM | POA: Diagnosis not present

## 2022-04-28 ENCOUNTER — Ambulatory Visit (INDEPENDENT_AMBULATORY_CARE_PROVIDER_SITE_OTHER): Payer: Medicare Other

## 2022-04-28 DIAGNOSIS — Z0000 Encounter for general adult medical examination without abnormal findings: Secondary | ICD-10-CM | POA: Diagnosis not present

## 2022-04-28 LAB — COMPREHENSIVE METABOLIC PANEL
ALT: 41 IU/L — ABNORMAL HIGH (ref 0–32)
AST: 40 IU/L (ref 0–40)
Albumin/Globulin Ratio: 1.7 (ref 1.2–2.2)
Albumin: 4.6 g/dL (ref 3.8–4.8)
Alkaline Phosphatase: 91 IU/L (ref 44–121)
BUN/Creatinine Ratio: 12 (ref 12–28)
BUN: 12 mg/dL (ref 8–27)
Bilirubin Total: 0.3 mg/dL (ref 0.0–1.2)
CO2: 25 mmol/L (ref 20–29)
Calcium: 10.2 mg/dL (ref 8.7–10.3)
Chloride: 98 mmol/L (ref 96–106)
Creatinine, Ser: 0.97 mg/dL (ref 0.57–1.00)
Globulin, Total: 2.7 g/dL (ref 1.5–4.5)
Glucose: 96 mg/dL (ref 70–99)
Potassium: 4.6 mmol/L (ref 3.5–5.2)
Sodium: 138 mmol/L (ref 134–144)
Total Protein: 7.3 g/dL (ref 6.0–8.5)
eGFR: 61 mL/min/{1.73_m2} (ref >59)

## 2022-04-28 LAB — B12 AND FOLATE PANEL
Folate: 18.2 ng/mL (ref >3.0)
Vitamin B-12: >2000 pg/mL — ABNORMAL HIGH (ref 232–1245)

## 2022-04-28 LAB — LIPID PANEL
Chol/HDL Ratio: 5.9 ratio — ABNORMAL HIGH (ref 0.0–4.4)
Cholesterol, Total: 323 mg/dL — ABNORMAL HIGH (ref 100–199)
HDL: 55 mg/dL (ref >39)
LDL Chol Calc (NIH): 241 mg/dL — ABNORMAL HIGH (ref 0–99)
Triglycerides: 143 mg/dL (ref 0–149)
VLDL Cholesterol Cal: 27 mg/dL (ref 5–40)

## 2022-04-28 LAB — CBC WITH DIFFERENTIAL/PLATELET
Basophils Absolute: 0.1 10*3/uL (ref 0.0–0.2)
Basos: 1 %
EOS (ABSOLUTE): 0.7 10*3/uL — ABNORMAL HIGH (ref 0.0–0.4)
Eos: 12 %
Hematocrit: 39.3 % (ref 34.0–46.6)
Hemoglobin: 13.4 g/dL (ref 11.1–15.9)
Immature Grans (Abs): 0 10*3/uL (ref 0.0–0.1)
Immature Granulocytes: 0 %
Lymphocytes Absolute: 1.8 10*3/uL (ref 0.7–3.1)
Lymphs: 31 %
MCH: 30.9 pg (ref 26.6–33.0)
MCHC: 34.1 g/dL (ref 31.5–35.7)
MCV: 91 fL (ref 79–97)
Monocytes Absolute: 0.5 10*3/uL (ref 0.1–0.9)
Monocytes: 8 %
Neutrophils Absolute: 2.7 10*3/uL (ref 1.4–7.0)
Neutrophils: 48 %
Platelets: 241 10*3/uL (ref 150–450)
RBC: 4.34 x10E6/uL (ref 3.77–5.28)
RDW: 12.2 % (ref 11.7–15.4)
WBC: 5.7 10*3/uL (ref 3.4–10.8)

## 2022-04-28 LAB — CARDIOVASCULAR RISK ASSESSMENT

## 2022-04-28 LAB — METHYLMALONIC ACID, SERUM: Methylmalonic Acid: 127 nmol/L (ref 0–378)

## 2022-04-28 NOTE — Progress Notes (Signed)
Blood count normal.  Liver function normal.  Kidney function normal.  Cholesterol: LDL 241. Offer praluent shots every 2 weeks. This is the sister medicine to repatha. Patient has known CORONARY ARTERY DISEASE and Familial hyperlipidemia.  B12 great. Folate normal.

## 2022-04-29 ENCOUNTER — Other Ambulatory Visit: Payer: Self-pay

## 2022-04-29 DIAGNOSIS — G894 Chronic pain syndrome: Secondary | ICD-10-CM | POA: Diagnosis not present

## 2022-04-29 DIAGNOSIS — M533 Sacrococcygeal disorders, not elsewhere classified: Secondary | ICD-10-CM | POA: Diagnosis not present

## 2022-04-29 DIAGNOSIS — M961 Postlaminectomy syndrome, not elsewhere classified: Secondary | ICD-10-CM | POA: Diagnosis not present

## 2022-04-29 DIAGNOSIS — M47816 Spondylosis without myelopathy or radiculopathy, lumbar region: Secondary | ICD-10-CM | POA: Diagnosis not present

## 2022-04-29 MED ORDER — GABAPENTIN 600 MG PO TABS: 600.0000 mg | ORAL_TABLET | Freq: Every day | ORAL | 1 refills | Status: DC

## 2022-05-02 NOTE — Patient Instructions (Signed)
Ms. Gradilla , Thank you for taking time to come for your Medicare Wellness Visit. I appreciate your ongoing commitment to your health goals. Please review the following plan we discussed and let me know if I can assist you in the future.   Screening recommendations/referrals: Colonoscopy: Due Mammogram: Due Bone Density: Due Recommended yearly ophthalmology/optometry visit for glaucoma screening and checkup Recommended yearly dental visit for hygiene and checkup  Vaccinations: Influenza vaccine: up-to-date Pneumococcal vaccine: up-to-date Tdap vaccine: Due - you can get this at the pharmacy Shingles vaccine: Due - you can get this at the pharmacy    Advanced directives: Please bring a copy for your medical record   Preventive Care 65 Years and Older, Female   Preventive care refers to lifestyle choices and visits with your health care provider that can promote health and wellness.  What does preventive care include? A yearly physical exam. This is also called an annual well check. Dental exams once or twice a year. Routine eye exams. Ask your health care provider how often you should have your eyes checked. Personal lifestyle choices, including: Daily care of your teeth and gums. Regular physical activity. Eating a healthy diet. Avoiding tobacco and drug use. Limiting alcohol use. Practicing safe sex. Taking low-dose aspirin every day. Taking vitamin and mineral supplements as recommended by your health care provider.  What happens during an annual well check? The services and screenings done by your health care provider during your annual well check will depend on your age, overall health, lifestyle risk factors, and family history of disease.  Counseling Your health care provider may ask you questions about your: Alcohol use. Tobacco use. Drug use. Emotional well-being. Home and relationship well-being. Sexual activity. Eating habits. History of falls. Memory and  ability to understand (cognition). Work and work Statistician. Reproductive health.  Screening You may have the following tests or measurements: Height, weight, and BMI. Blood pressure. Lipid and cholesterol levels. These may be checked every 5 years, or more frequently if you are over 32 years old. Skin check. Lung cancer screening. You may have this screening every year starting at age 20 if you have a 30-pack-year history of smoking and currently smoke or have quit within the past 15 years. Fecal occult blood test (FOBT) of the stool. You may have this test every year starting at age 40. Flexible sigmoidoscopy or colonoscopy. You may have a sigmoidoscopy every 5 years or a colonoscopy every 10 years starting at age 6. Hepatitis C blood test. Hepatitis B blood test. Sexually transmitted disease (STD) testing. Diabetes screening. This is done by checking your blood sugar (glucose) after you have not eaten for a while (fasting). You may have this done every 1-3 years. Bone density scan. This is done to screen for osteoporosis. You may have this done starting at age 53. Mammogram. This may be done every 1-2 years. Talk to your health care provider about how often you should have regular mammograms. Talk with your health care provider about your test results, treatment options, and if necessary, the need for more tests.  Vaccines Your health care provider may recommend certain vaccines, such as: Influenza vaccine. This is recommended every year. Tetanus, diphtheria, and acellular pertussis (Tdap, Td) vaccine. You may need a Td booster every 10 years. Zoster vaccine. You may need this after age 63. Pneumococcal 13-valent conjugate (PCV13) vaccine. One dose is recommended after age 61. Pneumococcal polysaccharide (PPSV23) vaccine. One dose is recommended after age 7. Talk to your health  care provider about which screenings and vaccines you need and how often you need them.  This information  is not intended to replace advice given to you by your health care provider. Make sure you discuss any questions you have with your health care provider. Document Released: 07/03/2015 Document Revised: 02/24/2016 Document Reviewed: 04/07/2015 Elsevier Interactive Patient Education  2017 [place] Prevention in the Home  Falls can cause injuries. They can happen to people of all ages. There are many things you can do to make your home safe and to help prevent falls.  What can I do on the outside of my home? Regularly fix the edges of walkways and driveways and fix any cracks. Remove anything that might make you trip as you walk through a door, such as a raised step or threshold. Trim any bushes or trees on the path to your home. Use bright outdoor lighting. Clear any walking paths of anything that might make someone trip, such as rocks or tools. Regularly check to see if handrails are loose or broken. Make sure that both sides of any steps have handrails. Any raised decks and porches should have guardrails on the edges. Have any leaves, snow, or ice cleared regularly. Use sand or salt on walking paths during winter. Clean up any spills in your garage right away. This includes oil or grease spills.  What can I do in the bathroom? Use night lights. Install grab bars by the toilet and in the tub and shower. Do not use towel bars as grab bars. Use non-skid mats or decals in the tub or shower. If you need to sit down in the shower, use a plastic, non-slip stool. Keep the floor dry. Clean up any water that spills on the floor as soon as it happens. Remove soap buildup in the tub or shower regularly. Attach bath mats securely with double-sided non-slip rug tape. Do not have throw rugs and other things on the floor that can make you trip.  What can I do in the bedroom? Use night lights. Make sure that you have a light by your bed that is easy to reach. Do not use any sheets or  blankets that are too big for your bed. They should not hang down onto the floor. Have a firm chair that has side arms. You can use this for support while you get dressed. Do not have throw rugs and other things on the floor that can make you trip.  What can I do in the kitchen? Clean up any spills right away. Avoid walking on wet floors. Keep items that you use a lot in easy-to-reach places. If you need to reach something above you, use a strong step stool that has a grab bar. Keep electrical cords out of the way. Do not use floor polish or wax that makes floors slippery. If you must use wax, use non-skid floor wax. Do not have throw rugs and other things on the floor that can make you trip.  What can I do with my stairs? Do not leave any items on the stairs. Make sure that there are handrails on both sides of the stairs and use them. Fix handrails that are broken or loose. Make sure that handrails are as long as the stairways. Check any carpeting to make sure that it is firmly attached to the stairs. Fix any carpet that is loose or worn. Avoid having throw rugs at the top or bottom of the stairs. If  you do have throw rugs, attach them to the floor with carpet tape. Make sure that you have a light switch at the top of the stairs and the bottom of the stairs. If you do not have them, ask someone to add them for you.  What else can I do to help prevent falls? Wear shoes that: Do not have high heels. Have rubber bottoms. Are comfortable and fit you well. Are closed at the toe. Do not wear sandals. If you use a stepladder: Make sure that it is fully opened. Do not climb a closed stepladder. Make sure that both sides of the stepladder are locked into place. Ask someone to hold it for you, if possible. Clearly mark and make sure that you can see: Any grab bars or handrails. First and last steps. Where the edge of each step is. Use tools that help you move around (mobility aids) if they  are needed. These include: Canes. Walkers. Scooters. Crutches. Turn on the lights when you go into a dark area. Replace any light bulbs as soon as they burn out. Set up your furniture so you have a clear path. Avoid moving your furniture around. If any of your floors are uneven, fix them. If there are any pets around you, be aware of where they are. Review your medicines with your doctor. Some medicines can make you feel dizzy. This can increase your chance of falling. Ask your doctor what other things that you can do to help prevent falls.  This information is not intended to replace advice given to you by your health care provider. Make sure you discuss any questions you have with your health care provider. Document Released: 04/02/2009 Document Revised: 11/12/2015 Document Reviewed: 07/11/2014 Elsevier Interactive Patient Education  2017 [employer].

## 2022-05-02 NOTE — Progress Notes (Signed)
Subjective:   Courtney [name] is a 76 y.o. female who presents for Medicare Annual (Subsequent) preventive examination.  I connected with  Courtney [name] on 05/02/22 by a audio enabled telemedicine application and verified that I am speaking with the correct person using two identifiers.  Patient Location: Home  Provider Location: Office/Clinic  I discussed the limitations of evaluation and management by telemedicine. The patient expressed understanding and agreed to proceed.  Cardiac Risk Factors include: advanced age (>3mn, >>60women)     Objective:    There were no vitals filed for this visit. There is no height or weight on file to calculate BMI.     09/03/2018   10:41 AM 08/27/2018   12:01 PM 07/29/2015    5:48 PM 07/22/2015   12:17 PM  Advanced Directives  Does Patient Have a Medical Advance Directive? No No No No  Would patient like information on creating a medical advance directive? No - Patient declined No - Patient declined No - patient declined information No - patient declined information    Current Medications (verified) Outpatient Encounter Medications as of 04/28/2022  Medication Sig   albuterol (VENTOLIN HFA) 108 (90 Base) MCG/ACT inhaler Inhale into the lungs.   budesonide-formoterol (SYMBICORT) 160-4.5 MCG/ACT inhaler Inhale 2 puffs into the lungs in the morning and at bedtime.   cetirizine (ZYRTEC) 10 MG tablet Take 1 tablet (10 mg total) by mouth daily.   Cholecalciferol (D3-50 PO) Take by mouth.   ciprofloxacin (CIPRO) 500 MG tablet Take 500 mg by mouth 2 (two) times daily as needed.   dutasteride (AVODART) 0.5 MG capsule Take 0.5 mg by mouth daily.   furosemide (LASIX) 20 MG tablet TAKE 1 TABLET(20 MG) BY MOUTH DAILY   gabapentin (NEURONTIN) 600 MG tablet Take 1 tablet (600 mg total) by mouth at bedtime. As needed   ipratropium (ATROVENT) 0.02 % nebulizer solution Take 2.5 mLs (0.5 mg total) by nebulization 4 (four) times daily.   levothyroxine (SYNTHROID)  75 MCG tablet Take 1 tablet (75 mcg total) by mouth daily.   metoprolol tartrate (LOPRESSOR) 50 MG tablet TAKE 1 TABLET(50 MG) BY MOUTH TWICE DAILY WITH MEALS   Multiple Vitamins-Minerals (MULTIVITAMIN WITH MINERALS) tablet Take 1 tablet by mouth daily.   nitroGLYCERIN (NITROSTAT) 0.4 MG SL tablet PLACE 1 TABLET UNDER THE TONGUE EVERY 5 MINUTES AS NEEDED FOR CHEST PAIN.   omeprazole (PRILOSEC) 40 MG capsule Take 1 capsule (40 mg total) by mouth daily.   oxyCODONE-acetaminophen (PERCOCET) 10-325 MG tablet Take by mouth.   tirzepatide (MOUNJARO) 7.5 MG/0.5ML Pen Inject 7.5 mg into the skin once a week.   tretinoin (RETIN-A) 0.05 % cream tretinoin 0.05 % topical cream   trimethoprim (TRIMPEX) 100 MG tablet Take 100 mg by mouth at bedtime.   vitamin E 180 MG (400 UNITS) capsule Take 400 Units by mouth daily.   [DISCONTINUED] gabapentin (NEURONTIN) 600 MG tablet Take 1 tablet (600 mg total) by mouth at bedtime. As needed   No facility-administered encounter medications on file as of 04/28/2022.    Allergies (verified) Levofloxacin, Bactrim [sulfamethoxazole-trimethoprim], Clarithromycin, Lyrica [pregabalin], Macrobid [nitrofurantoin], Nexletol [bempedoic acid], Other, Repatha [evolocumab], Statins, and Zetia [ezetimibe]   History: Past Medical History:  Diagnosis Date   Anemia    Arthritis    Bronchiectasis ([place]    CAP (community acquired pneumonia)  vs Eosinophilic Pna 29/11/2950  Followed in Pulmonary clinic/ [facility]/ [place]    Cardiac dysrhythmia    Chronic idiopathic constipation  Colitis, acute 02/08/2021   Depression, major, recurrent, moderate (HCC)    Headache    Heart murmur    History of bladder infections    History of COVID-19    Hyperlipidemia    Hypertension    Hypothyroidism    Knee pain, bilateral    Melena 02/08/2021   Osteoarthritis    Pneumonia    Primary insomnia    Recovering alcoholic (HCC)    SVT (supraventricular tachycardia)    UTI (urinary  tract infection)    Varicose veins    Vitamin B12 deficiency    Past Surgical History:  Procedure Laterality Date   ABDOMINAL HYSTERECTOMY  1991   ANGIOPLASTY     at least 15 years ago, pt. denies    APPENDECTOMY     AUGMENTATION MAMMAPLASTY Bilateral    BACK SURGERY     had 2 surgeries. Have rods placed in back    BARIATRIC SURGERY     lap band-at least 14 years ago   BREAST BIOPSY Left    x2   BREAST ENHANCEMENT SURGERY  2006   CHOLECYSTECTOMY  12/2019   removed lap band.    COLONOSCOPY  12/01/2016   Moderate predominantly sigmod diverticulosis. Otherwise grossly normal colonoscopy   EYE SURGERY     IOL- bilateral - [place]   KNEE ARTHROSCOPY Left    lap band surgery  1981   LUMBAR FUSION  07/29/2015   posterior level one   removal of cervical disc fragments  10/2007   pt. denies    Family History  Problem Relation Age of Onset   Heart disease Mother    Heart disease Father    Diabetes Sister    Other Sister        BRAIN TUMOR   Heart disease Brother    Heart disease Brother    Heart disease Brother    Heart disease Brother    Heart disease Brother    Colon cancer Maternal Aunt    Colon cancer Cousin    Social History   Socioeconomic History   Marital status: Married    Spouse name: Not on file   Number of children: 2   Years of education: Not on file   Highest education level: Not on file  Occupational History   Occupation: retired    Comment: Marine scientist  Tobacco Use   Smoking status: Never   Smokeless tobacco: Never  Vaping Use   Vaping Use: Never used  Substance and Sexual Activity   Alcohol use: No    Alcohol/week: 0.0 standard drinks of alcohol    Comment: recovery x 20 yrs   Drug use: No   Sexual activity: Not Currently    Comment: MARRIED  Other Topics Concern   Not on file  Social History Narrative   Not on file   Social Determinants of Health   Financial Resource Strain: Medium Risk (11/08/2021)   Overall Financial Resource Strain  (CARDIA)    Difficulty of Paying Living Expenses: Somewhat hard  Food Insecurity: No Food Insecurity (11/18/2020)   Hunger Vital Sign    Worried About Running Out of Food in the Last Year: Never true    Ran Out of Food in the Last Year: Never true  Transportation Needs: No Transportation Needs (11/08/2021)   PRAPARE - Hydrologist (Medical): No    Lack of Transportation (Non-Medical): No  Physical Activity: Not on file  Stress: Not on file  Social Connections: Not on  file    Tobacco Counseling Counseling given: Not Answered   Clinical Intake:  Pre-visit preparation completed: Yes Pain : No/denies pain   BMI - recorded: 25.63 Nutritional Status: BMI 25 -29 Overweight Nutritional Risks: None Diabetes: Yes (last A1C down 5.3) CBG done?: No How often do you need to have someone help you when you read instructions, pamphlets, or other written materials from your doctor or pharmacy?: 1 - Never Interpreter Needed?: No    Activities of Daily Living    05/02/2022   10:48 AM  In your present state of health, do you have any difficulty performing the following activities:  Hearing? 0  Vision? 0  Difficulty concentrating or making decisions? 0  Walking or climbing stairs? 0  Dressing or bathing? 0  Doing errands, shopping? 0  Preparing Food and eating ? N  Using the Toilet? N  In the past six months, have you accidently leaked urine? N  Do you have problems with loss of bowel control? N  Managing your Medications? N  Managing your Finances? N  Housekeeping or managing your Housekeeping? N    Patient Care Team: [name], MD as PCP - General (Internal Medicine) Jettie Booze, MD as PCP - Cardiology (Cardiology) McGukin, Shade Flood., MD (Cardiology) [name], MD as Consulting Physician (Pulmonary Disease) Lane Hacker, [facility] (Pharmacist)     Assessment:   This is a routine wellness examination for [place].  Hearing/Vision  screen No results found.  Dietary issues and exercise activities discussed: Exercise limited by: None identified  Depression Screen    04/25/2022    8:57 AM 02/08/2022    9:18 AM 11/08/2021    8:46 AM 07/19/2021    8:53 AM 07/19/2021    8:28 AM 02/08/2021    2:03 PM 05/11/2020    3:01 PM  PHQ 2/9 Scores  PHQ - 2 Score 0 0 2 4 0 4 0  PHQ- 9 Score '3 7 8 12  12     '$ Fall Risk    05/02/2022   10:47 AM 07/19/2021    8:28 AM 03/15/2021    1:51 PM 02/08/2021    2:07 PM 05/11/2020    3:01 PM  Fall Risk   Falls in the past year? 0 0 0 0 0  Number falls in past yr: 0 0 0 0 0  Injury with Fall? 0 0 0 0   Risk for fall due to : No Fall Risks   No Fall Risks   Follow up Falls evaluation completed;Education provided Falls evaluation completed  Falls evaluation completed     [place]:  Any stairs in or around the home? Yes  If so, are there any without handrails? No  Home free of loose throw rugs in walkways, pet beds, electrical cords, etc? Yes  Adequate lighting in your home to reduce risk of falls? Yes   ASSISTIVE DEVICES UTILIZED TO PREVENT FALLS:  Life alert? No  Use of a cane, walker or w/c? No  Grab bars in the bathroom? No  Shower chair or bench in shower? No  Elevated toilet seat or a handicapped toilet? No   Cognitive Function:        05/02/2022   10:49 AM  6CIT Screen  What Year? 0 points  What month? 0 points  What time? 0 points  Count back from 20 0 points  Months in reverse 0 points  Repeat phrase 0 points  Total Score 0  points    Immunizations Immunization History  Administered Date(s) Administered   Fluad Quad(high Dose 65+) 04/13/2021, 04/25/2022   Influenza, High Dose Seasonal PF 04/11/2017, 03/26/2018   Influenza,inj,Quad PF,6+ Mos 02/22/2014, 03/23/2015, 02/19/2019   Influenza-Unspecified 02/22/2014, 04/11/2017, 03/26/2018, 02/19/2019   Moderna Sars-Covid-2 Vaccination 07/22/2019, 08/12/2019   Pneumococcal  Conjugate-13 02/16/2017   Pneumococcal Polysaccharide-23 04/07/2015   Zoster, Live 04/13/2006    TDAP status: Due, Education has been provided regarding the importance of this vaccine. Advised may receive this vaccine at local pharmacy or Health Dept. Aware to provide a copy of the vaccination record if obtained from local pharmacy or Health Dept. Verbalized acceptance and understanding.  Flu Vaccine status: Up to date  Pneumococcal vaccine status: Up to date  Covid-19 vaccine status: Information provided on how to obtain vaccines.   Qualifies for Shingles Vaccine? Yes   Zostavax completed Yes   Shingrix Completed?: No.    Education has been provided regarding the importance of this vaccine. Patient has been advised to call insurance company to determine out of pocket expense if they have not yet received this vaccine. Advised may also receive vaccine at local pharmacy or Health Dept. Verbalized acceptance and understanding.  Screening Tests Health Maintenance  Topic Date Due   TETANUS/TDAP  Never done   Zoster Vaccines- Shingrix (1 of 2) Never done   Medicare Annual Wellness (AWV)  08/24/2019   COLONOSCOPY (Pts 45-67yr Insurance coverage will need to be confirmed)  12/01/2021   Pneumonia Vaccine 76 Years old  Completed   INFLUENZA VACCINE  Completed   DEXA SCAN  Completed   Hepatitis C Screening  Completed   HPV VACCINES  Aged Out   COVID-19 Vaccine  Discontinued    Health Maintenance  Health Maintenance Due  Topic Date Due   TETANUS/TDAP  Never done   Zoster Vaccines- Shingrix (1 of 2) Never done   Medicare Annual Wellness (AWV)  08/24/2019   COLONOSCOPY (Pts 45-436yrInsurance coverage will need to be confirmed)  12/01/2021    Colorectal cancer screening: Type of screening: Colonoscopy. Completed 12/01/16. Repeat every 5 years  Mammogram status: Ordered   Lung Cancer Screening: (Low Dose CT Chest recommended if Age 76-80ears, 30 pack-year currently smoking OR have  quit w/in 15years.) does not qualify.   Additional Screening:  Vision Screening: Recommended annual ophthalmology exams for early detection of glaucoma and other disorders of the eye. Is the patient up to date with their annual eye exam?  Yes   Dental Screening: Recommended annual dental exams for proper oral hygiene  Community Resource Referral / Chronic Care Management: CRR required this visit?  No   CCM required this visit?  No      Plan:     I have personally reviewed and noted the following in the patient's chart:   Medical and social history Use of alcohol, tobacco or illicit drugs  Current medications and supplements including opioid prescriptions.  Functional ability and status Nutritional status Physical activity Advanced directives List of other physicians Hospitalizations, surgeries, and ER visits in previous 12 months Vitals Screenings to include cognitive, depression, and falls Referrals and appointments  In addition, I have reviewed and discussed with patient certain preventive protocols, quality metrics, and best practice recommendations. A written personalized care plan for preventive services as well as general preventive health recommendations were provided to patient.     KiErie NoeLPN   [id number]

## 2022-05-06 DIAGNOSIS — Z881 Allergy status to other antibiotic agents status: Secondary | ICD-10-CM | POA: Diagnosis not present

## 2022-05-06 DIAGNOSIS — R55 Syncope and collapse: Secondary | ICD-10-CM | POA: Diagnosis not present

## 2022-05-06 DIAGNOSIS — E785 Hyperlipidemia, unspecified: Secondary | ICD-10-CM | POA: Diagnosis not present

## 2022-05-06 DIAGNOSIS — G894 Chronic pain syndrome: Secondary | ICD-10-CM | POA: Diagnosis not present

## 2022-05-06 DIAGNOSIS — I872 Venous insufficiency (chronic) (peripheral): Secondary | ICD-10-CM | POA: Diagnosis not present

## 2022-05-06 DIAGNOSIS — Z955 Presence of coronary angioplasty implant and graft: Secondary | ICD-10-CM | POA: Diagnosis not present

## 2022-05-06 DIAGNOSIS — Z882 Allergy status to sulfonamides status: Secondary | ICD-10-CM | POA: Diagnosis not present

## 2022-05-06 DIAGNOSIS — I2510 Atherosclerotic heart disease of native coronary artery without angina pectoris: Secondary | ICD-10-CM | POA: Diagnosis not present

## 2022-05-06 DIAGNOSIS — I83813 Varicose veins of bilateral lower extremities with pain: Secondary | ICD-10-CM | POA: Diagnosis not present

## 2022-05-06 DIAGNOSIS — Z888 Allergy status to other drugs, medicaments and biological substances status: Secondary | ICD-10-CM | POA: Diagnosis not present

## 2022-05-06 DIAGNOSIS — I10 Essential (primary) hypertension: Secondary | ICD-10-CM | POA: Diagnosis not present

## 2022-05-17 DIAGNOSIS — Z6825 Body mass index (BMI) 25.0-25.9, adult: Secondary | ICD-10-CM | POA: Diagnosis not present

## 2022-05-17 DIAGNOSIS — R29818 Other symptoms and signs involving the nervous system: Secondary | ICD-10-CM | POA: Diagnosis not present

## 2022-05-19 ENCOUNTER — Other Ambulatory Visit: Payer: Self-pay | Admitting: Neurosurgery

## 2022-05-19 DIAGNOSIS — M48062 Spinal stenosis, lumbar region with neurogenic claudication: Secondary | ICD-10-CM

## 2022-05-20 ENCOUNTER — Encounter: Payer: Self-pay | Admitting: Nurse Practitioner

## 2022-05-20 ENCOUNTER — Ambulatory Visit (INDEPENDENT_AMBULATORY_CARE_PROVIDER_SITE_OTHER): Payer: Medicare Other | Admitting: Nurse Practitioner

## 2022-05-20 VITALS — BP 110/60 | HR 75 | Temp 97.1°F | Ht 65.0 in | Wt 151.0 lb

## 2022-05-20 DIAGNOSIS — N3000 Acute cystitis without hematuria: Secondary | ICD-10-CM | POA: Diagnosis not present

## 2022-05-20 LAB — POCT URINALYSIS DIP (CLINITEK)
Blood, UA: NEGATIVE
Glucose, UA: 100 mg/dL — AB
Nitrite, UA: POSITIVE — AB
POC PROTEIN,UA: NEGATIVE
Spec Grav, UA: 1.01 (ref 1.010–1.025)
Urobilinogen, UA: 2 E.U./dL — AB
pH, UA: 6.5 (ref 5.0–8.0)

## 2022-05-20 MED ORDER — AMOXICILLIN-POT CLAVULANATE 875-125 MG PO TABS: 1.0000 | ORAL_TABLET | Freq: Two times a day (BID) | ORAL | 0 refills | Status: DC

## 2022-05-20 NOTE — Progress Notes (Signed)
 Acute Office Visit  Subjective:    Patient ID: Courtney [name], female    DOB: 01/29/1946, 76 y.o.   MRN: [id number]  Chief Complaint  Patient presents with   Urinary Tract Infection    HPI: Patient is in today for Urinary symptoms  She reports new onset urinary frequency and urinary urgency. The current episode started yesterday and is staying constant. Patient states symptoms are mild in intensity, occurring constantly.Denies fever, chills, or body aches. She  has not been recently treated for similar symptoms. Treatment has included Azo and pushing fluids.       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 07/25/2014   Followed in Pulmonary clinic/ [facility]/ [facility]    Cardiac dysrhythmia    Chronic idiopathic constipation    Colitis, acute 02/08/2021   Depression, major, recurrent, moderate (HCC)    Headache    Heart murmur    History of bladder infections    History of COVID-19    Hyperlipidemia    Hypertension    Hypothyroidism    Knee pain, bilateral    Melena 02/08/2021   Osteoarthritis    Pneumonia    Primary insomnia    Recovering alcoholic (HCC)    SVT (supraventricular tachycardia)    UTI (urinary tract infection)    Varicose veins    Vitamin B12 deficiency     Past Surgical History:  Procedure Laterality Date   ABDOMINAL HYSTERECTOMY  1991   ANGIOPLASTY     at least 15 years ago, pt. denies    APPENDECTOMY     AUGMENTATION MAMMAPLASTY Bilateral    BACK SURGERY     had 2 surgeries. Have rods placed in back    BARIATRIC SURGERY     lap band-at least 14 years ago   BREAST BIOPSY Left    x2   BREAST ENHANCEMENT SURGERY  2006   CHOLECYSTECTOMY  12/2019   removed lap band.    COLONOSCOPY  12/01/2016   Moderate predominantly sigmod diverticulosis. Otherwise grossly normal colonoscopy   EYE SURGERY     IOL- bilateral - [place]   KNEE  ARTHROSCOPY Left    lap band surgery  1981   LUMBAR FUSION  07/29/2015   posterior level one   removal of cervical disc fragments  10/2007   pt. denies     Family History  Problem Relation Age of Onset   Heart disease Mother    Heart disease Father    Diabetes Sister    Other Sister        BRAIN TUMOR   Heart disease Brother    Heart disease Brother    Heart disease Brother    Heart disease Brother    Heart disease Brother    Colon cancer Maternal Aunt    Colon cancer Cousin     Social History   Socioeconomic History   Marital status: Married    Spouse name: Not on file   Number of children: 2   Years of education: Not on file   Highest education level: Not on file  Occupational History   Occupation: retired    Comment: nurse  Tobacco Use   Smoking status: Never   Smokeless tobacco: Never  Vaping Use   Vaping Use: Never used  Substance and Sexual Activity   Alcohol use: No    Alcohol/week: 0.0 standard   drinks of alcohol    Comment: recovery x 20 yrs   Drug use: No   Sexual activity: Not Currently    Comment: MARRIED  Other Topics Concern   Not on file  Social History Narrative   Not on file   Social Determinants of Health   Financial Resource Strain: Medium Risk (11/08/2021)   Overall Financial Resource Strain (CARDIA)    Difficulty of Paying Living Expenses: Somewhat hard  Food Insecurity: No Food Insecurity (11/18/2020)   Hunger Vital Sign    Worried About Running Out of Food in the Last Year: Never true    Ran Out of Food in the Last Year: Never true  Transportation Needs: No Transportation Needs (11/08/2021)   PRAPARE - Hydrologist (Medical): No    Lack of Transportation (Non-Medical): No  Physical Activity: Not on file  Stress: Not on file  Social Connections: Not on file  Intimate Partner Violence: Not on file    Outpatient Medications Prior to Visit  Medication Sig Dispense Refill   albuterol (VENTOLIN HFA) 108  (90 Base) MCG/ACT inhaler Inhale into the lungs.     budesonide-formoterol (SYMBICORT) 160-4.5 MCG/ACT inhaler Inhale 2 puffs into the lungs in the morning and at bedtime. 1 each 6   cetirizine (ZYRTEC) 10 MG tablet Take 1 tablet (10 mg total) by mouth daily. 90 tablet 3   Cholecalciferol (D3-50 PO) Take by mouth.     ciprofloxacin (CIPRO) 500 MG tablet Take 500 mg by mouth 2 (two) times daily as needed.     dutasteride (AVODART) 0.5 MG capsule Take 0.5 mg by mouth daily.     furosemide (LASIX) 20 MG tablet TAKE 1 TABLET(20 MG) BY MOUTH DAILY 90 tablet 0   gabapentin (NEURONTIN) 600 MG tablet Take 1 tablet (600 mg total) by mouth at bedtime. As needed 90 tablet 1   ipratropium (ATROVENT) 0.02 % nebulizer solution Take 2.5 mLs (0.5 mg total) by nebulization 4 (four) times daily. 100 mL 12   levothyroxine (SYNTHROID) 75 MCG tablet Take 1 tablet (75 mcg total) by mouth daily. 90 tablet 1   metoprolol tartrate (LOPRESSOR) 50 MG tablet TAKE 1 TABLET(50 MG) BY MOUTH TWICE DAILY WITH MEALS 180 tablet 1   Multiple Vitamins-Minerals (MULTIVITAMIN WITH MINERALS) tablet Take 1 tablet by mouth daily.     nitroGLYCERIN (NITROSTAT) 0.4 MG SL tablet PLACE 1 TABLET UNDER THE TONGUE EVERY 5 MINUTES AS NEEDED FOR CHEST PAIN. 25 tablet 1   omeprazole (PRILOSEC) 40 MG capsule Take 1 capsule (40 mg total) by mouth daily. 90 capsule 1   oxyCODONE-acetaminophen (PERCOCET) 10-325 MG tablet Take by mouth.     tirzepatide (MOUNJARO) 7.5 MG/0.5ML Pen Inject 7.5 mg into the skin once a week. 6 mL 0   tretinoin (RETIN-A) 0.05 % cream tretinoin 0.05 % topical cream     trimethoprim (TRIMPEX) 100 MG tablet Take 100 mg by mouth at bedtime.     vitamin E 180 MG (400 UNITS) capsule Take 400 Units by mouth daily.     No facility-administered medications prior to visit.    Allergies  Allergen Reactions   Levofloxacin Hives, Shortness Of Breath, Swelling and Other (See Comments)    RASH IN MOUTH   (can take IV route)    Bactrim [Sulfamethoxazole-Trimethoprim] Hives and Itching   Clarithromycin     GI upset Other reaction(s): GI Upset (intolerance) GI upset -can take but does cause mild upset    Lyrica [Pregabalin]  Macrobid [Nitrofurantoin]     Not tolerate   Nexletol [Bempedoic Acid]     Muscle pain   Other Other (See Comments)   Repatha [Evolocumab]     Legs hurt.    Statins Other (See Comments)    Myalgias   Zetia [Ezetimibe]     Muscle pain    Review of Systems See pertinent positives and negatives per HPI.     Objective:    Physical Exam Vitals reviewed.  Skin:    General: Skin is warm and dry.     Capillary Refill: Capillary refill takes less than 2 seconds.  Neurological:     General: No focal deficit present.     Mental Status: She is alert and oriented to person, place, and time.  Psychiatric:        Mood and Affect: Mood normal.        Behavior: Behavior normal.   BP 110/60   Pulse 75   Temp (!) 97.1 F (36.2 C)   Ht 5' 5" (1.651 m)   Wt 151 lb (68.5 kg)   SpO2 100%   BMI 25.13 kg/m  Wt Readings from Last 3 Encounters:  05/20/22 151 lb (68.5 kg)  04/25/22 154 lb (69.9 kg)  03/02/22 154 lb (69.9 kg)    Health Maintenance Due  Topic Date Due   DTaP/Tdap/Td (1 - Tdap) Never done   Zoster Vaccines- Shingrix (1 of 2) Never done   COLONOSCOPY (Pts 45-49yrs Insurance coverage will need to be confirmed)  12/01/2021       Lab Results  Component Value Date   TSH 2.270 02/08/2022   Lab Results  Component Value Date   WBC 5.7 04/25/2022   HGB 13.4 04/25/2022   HCT 39.3 04/25/2022   MCV 91 04/25/2022   PLT 241 04/25/2022   Lab Results  Component Value Date   NA 138 04/25/2022   K 4.6 04/25/2022   CO2 25 04/25/2022   GLUCOSE 96 04/25/2022   BUN 12 04/25/2022   CREATININE 0.97 04/25/2022   BILITOT 0.3 04/25/2022   ALKPHOS 91 04/25/2022   AST 40 04/25/2022   ALT 41 (H) 04/25/2022   PROT 7.3 04/25/2022   ALBUMIN 4.6 04/25/2022   CALCIUM 10.2  04/25/2022   ANIONGAP 9 09/04/2018   EGFR 61 04/25/2022   Lab Results  Component Value Date   CHOL 323 (H) 04/25/2022   Lab Results  Component Value Date   HDL 55 04/25/2022   Lab Results  Component Value Date   LDLCALC 241 (H) 04/25/2022   Lab Results  Component Value Date   TRIG 143 04/25/2022   Lab Results  Component Value Date   CHOLHDL 5.9 (H) 04/25/2022   Lab Results  Component Value Date   HGBA1C 5.3 02/08/2022       Assessment & Plan:   1. Acute cystitis without hematuria - POCT URINALYSIS DIP (CLINITEK) - Urine Culture - amoxicillin-clavulanate (AUGMENTIN) 875-125 MG tablet; Take 1 tablet by mouth 2 (two) times daily.  Dispense: 14 tablet; Refill: 0    Cranberry tablets 500 mg to 1, 000 mg daily Take Augmentin twice daily with food for 7 days Push fluids Follow-up as needed     Follow-up: PRN  An After Visit Summary was printed and given to the patient.  I, Alvis Edgell J Chanequa Spees, NP, have reviewed all documentation for this visit. The documentation on 05/20/22 for the exam, diagnosis, procedures, and orders are all accurate and complete.    Signed,   Rip Harbour, NP [place] [phone]

## 2022-05-20 NOTE — Patient Instructions (Addendum)
Cranberry tablets 500 mg to 1, 000 mg daily Take Augmentin twice daily with food for 7 days Push fluids Follow-up as needed  Urinary Tract Infection, Adult A urinary tract infection (UTI) is an infection of any part of the urinary tract. The urinary tract includes: The kidneys. The ureters. The bladder. The urethra. These organs make, store, and get rid of pee (urine) in the body. What are the causes? This infection is caused by germs (bacteria) in your genital area. These germs grow and cause swelling (inflammation) of your urinary tract. What increases the risk? The following factors may make you more likely to develop this condition: Using a small, thin tube (catheter) to drain pee. Not being able to control when you pee or poop (incontinence). Being female. If you are female, these things can increase the risk: Using these methods to prevent pregnancy: A medicine that kills sperm (spermicide). A device that blocks sperm (diaphragm). Having low levels of a female hormone (estrogen). Being pregnant. You are more likely to develop this condition if: You have genes that add to your risk. You are sexually active. You take antibiotic medicines. You have trouble peeing because of: A prostate that is bigger than normal, if you are female. A blockage in the part of your body that drains pee from the bladder. A kidney stone. A nerve condition that affects your bladder. Not getting enough to drink. Not peeing often enough. You have other conditions, such as: Diabetes. A weak disease-fighting system (immune system). Sickle cell disease. Gout. Injury of the spine. What are the signs or symptoms? Symptoms of this condition include: Needing to pee right away. Peeing small amounts often. Pain or burning when peeing. Blood in the pee. Pee that smells bad or not like normal. Trouble peeing. Pee that is cloudy. Fluid coming from the vagina, if you are female. Pain in the belly or  lower back. Other symptoms include: Vomiting. Not feeling hungry. Feeling mixed up (confused). This may be the first symptom in older adults. Being tired and grouchy (irritable). A fever. Watery poop (diarrhea). How is this treated? Taking antibiotic medicine. Taking other medicines. Drinking enough water. In some cases, you may need to see a specialist. Follow these instructions at home:  Medicines Take over-the-counter and prescription medicines only as told by your doctor. If you were prescribed an antibiotic medicine, take it as told by your doctor. Do not stop taking it even if you start to feel better. General instructions Make sure you: Pee until your bladder is empty. Do not hold pee for a long time. Empty your bladder after sex. Wipe from front to back after peeing or pooping if you are a female. Use each tissue one time when you wipe. Drink enough fluid to keep your pee pale yellow. Keep all follow-up visits. Contact a doctor if: You do not get better after 1-2 days. Your symptoms go away and then come back. Get help right away if: You have very bad back pain. You have very bad pain in your lower belly. You have a fever. You have chills. You feeling like you will vomit or you vomit. Summary A urinary tract infection (UTI) is an infection of any part of the urinary tract. This condition is caused by germs in your genital area. There are many risk factors for a UTI. Treatment includes antibiotic medicines. Drink enough fluid to keep your pee pale yellow. This information is not intended to replace advice given to you by your health care  provider. Make sure you discuss any questions you have with your health care provider. Document Revised: 01/17/2020 Document Reviewed: 01/17/2020 Elsevier Patient Education  [place].

## 2022-05-23 ENCOUNTER — Telehealth: Payer: Self-pay

## 2022-05-23 ENCOUNTER — Other Ambulatory Visit: Payer: Self-pay | Admitting: Nurse Practitioner

## 2022-05-23 DIAGNOSIS — N3000 Acute cystitis without hematuria: Secondary | ICD-10-CM

## 2022-05-23 MED ORDER — CIPROFLOXACIN HCL 500 MG PO TABS: 500.0000 mg | ORAL_TABLET | Freq: Two times a day (BID) | ORAL | 0 refills | Status: DC

## 2022-05-23 NOTE — Telephone Encounter (Signed)
Patient left voicemail stating augmentin caused nausea and vomiting over the weekend and would like a different medication. Per Larene Beach she will send in something different.

## 2022-05-24 LAB — URINE CULTURE

## 2022-05-26 ENCOUNTER — Other Ambulatory Visit: Payer: Self-pay

## 2022-05-26 MED ORDER — NITROFURANTOIN MONOHYD MACRO 100 MG PO CAPS: 100.0000 mg | ORAL_CAPSULE | Freq: Two times a day (BID) | ORAL | 0 refills | Status: DC

## 2022-06-02 ENCOUNTER — Inpatient Hospital Stay: Admission: RE | Admit: 2022-06-02 | Payer: Medicare Other | Source: Ambulatory Visit

## 2022-06-08 ENCOUNTER — Encounter: Payer: Self-pay | Admitting: Nurse Practitioner

## 2022-06-08 ENCOUNTER — Ambulatory Visit (INDEPENDENT_AMBULATORY_CARE_PROVIDER_SITE_OTHER): Payer: Medicare Other | Admitting: Nurse Practitioner

## 2022-06-08 VITALS — BP 118/72 | HR 74 | Temp 97.9°F | Ht 65.0 in | Wt 149.4 lb

## 2022-06-08 DIAGNOSIS — R300 Dysuria: Secondary | ICD-10-CM | POA: Diagnosis not present

## 2022-06-08 DIAGNOSIS — R39198 Other difficulties with micturition: Secondary | ICD-10-CM

## 2022-06-08 DIAGNOSIS — G8929 Other chronic pain: Secondary | ICD-10-CM

## 2022-06-08 DIAGNOSIS — M545 Low back pain, unspecified: Secondary | ICD-10-CM | POA: Insufficient documentation

## 2022-06-08 LAB — POCT URINALYSIS DIP (CLINITEK)
Bilirubin, UA: NEGATIVE
Blood, UA: NEGATIVE
Glucose, UA: NEGATIVE mg/dL
Ketones, POC UA: NEGATIVE mg/dL
Leukocytes, UA: NEGATIVE
Nitrite, UA: NEGATIVE
POC PROTEIN,UA: NEGATIVE
Spec Grav, UA: 1.015 (ref 1.010–1.025)
Urobilinogen, UA: 0.2 E.U./dL
pH, UA: 6 (ref 5.0–8.0)

## 2022-06-08 MED ORDER — PREDNISONE 10 MG PO TABS: ORAL_TABLET | ORAL | 0 refills | Status: AC

## 2022-06-08 MED ORDER — KETOROLAC TROMETHAMINE 60 MG/2ML IM SOLN
60.0000 mg | Freq: Once | INTRAMUSCULAR | Status: AC
  Administered 2022-06-08: 60 mg via INTRAMUSCULAR

## 2022-06-08 NOTE — Progress Notes (Signed)
Acute Office Visit  Subjective:    Patient ID: Courtney [name], female    DOB: [birth year], 76 y.o.   MRN: [id number]  CHIEF COMPLAINT: Incomplete voiding   HPI: patient is here with a complain of incomplete voiding, frequency and urgency and mild pain in her back. She was recently treated with Macrobid which she tolerated and she still thinks she is having UTI.  Urinary symptoms  She reports recurrent dysuria, urinary frequency, urinary hesitancy, and urinary urgency. The current episode started a few weeks ago and is gradually worsening. Patient states symptoms are moderate in intensity, occurring constantly. She  has been recently treated for similar symptoms.    Denies fever, shortness of breath, chest pain. Have flank pain   Past Medical History:  Diagnosis Date   Anemia    Arthritis    Bronchiectasis ([place])    CAP (community acquired pneumonia)  vs Eosinophilic Pna 12/27/238   Followed in Pulmonary clinic/ [facility]/ [facility]    Cardiac dysrhythmia    Chronic idiopathic constipation    Colitis, acute 02/08/2021   Depression, major, recurrent, moderate (HCC)    Headache    Heart murmur    History of bladder infections    History of COVID-19    Hyperlipidemia    Hypertension    Hypothyroidism    Knee pain, bilateral    Melena 02/08/2021   Osteoarthritis    Pneumonia    Primary insomnia    Recovering alcoholic (HCC)    SVT (supraventricular tachycardia)    UTI (urinary tract infection)    Varicose veins    Vitamin B12 deficiency     Past Surgical History:  Procedure Laterality Date   ABDOMINAL HYSTERECTOMY  1991   ANGIOPLASTY     at least 15 years ago, pt. denies    APPENDECTOMY     AUGMENTATION MAMMAPLASTY Bilateral    BACK SURGERY     had 2 surgeries. Have rods placed in back    BARIATRIC SURGERY     lap band-at least 14 years ago   BREAST BIOPSY Left    x2   BREAST ENHANCEMENT SURGERY  2006   CHOLECYSTECTOMY  12/2019   removed lap band.     COLONOSCOPY  12/01/2016   Moderate predominantly sigmod diverticulosis. Otherwise grossly normal colonoscopy   EYE SURGERY     IOL- bilateral - [place]   KNEE ARTHROSCOPY Left    lap band surgery  1981   LUMBAR FUSION  07/29/2015   posterior level one   removal of cervical disc fragments  10/2007   pt. denies     Family History  Problem Relation Age of Onset   Heart disease Mother    Heart disease Father    Diabetes Sister    Other Sister        BRAIN TUMOR   Heart disease Brother    Heart disease Brother    Heart disease Brother    Heart disease Brother    Heart disease Brother    Colon cancer Maternal Aunt    Colon cancer Cousin     Social History   Socioeconomic History   Marital status: Married    Spouse name: Not on file   Number of children: 2   Years of education: Not on file   Highest education level: Not on file  Occupational History   Occupation: retired    Comment: nurse  Tobacco Use   Smoking status: Never   Smokeless tobacco: Never  Vaping Use   Vaping Use: Never used  Substance and Sexual Activity   Alcohol use: No    Alcohol/week: 0.0 standard drinks of alcohol    Comment: recovery x 20 yrs   Drug use: No   Sexual activity: Not Currently    Comment: MARRIED  Other Topics Concern   Not on file  Social History Narrative   Not on file   Social Determinants of Health   Financial Resource Strain: Medium Risk (11/08/2021)   Overall Financial Resource Strain (CARDIA)    Difficulty of Paying Living Expenses: Somewhat hard  Food Insecurity: No Food Insecurity (11/18/2020)   Hunger Vital Sign    Worried About Running Out of Food in the Last Year: Never true    Ran Out of Food in the Last Year: Never true  Transportation Needs: No Transportation Needs (11/08/2021)   PRAPARE - Hydrologist (Medical): No    Lack of Transportation (Non-Medical): No  Physical Activity: Not on file  Stress: Not on file  Social  Connections: Not on file  Intimate Partner Violence: Not on file    Outpatient Medications Prior to Visit  Medication Sig Dispense Refill   albuterol (VENTOLIN HFA) 108 (90 Base) MCG/ACT inhaler Inhale into the lungs.     amLODipine (NORVASC) 5 MG tablet Take 5 mg by mouth daily.     budesonide-formoterol (SYMBICORT) 160-4.5 MCG/ACT inhaler Inhale 2 puffs into the lungs in the morning and at bedtime. 1 each 6   cetirizine (ZYRTEC) 10 MG tablet Take 1 tablet (10 mg total) by mouth daily. 90 tablet 3   Cholecalciferol (D3-50 PO) Take by mouth.     dutasteride (AVODART) 0.5 MG capsule Take 0.5 mg by mouth daily.     ezetimibe (ZETIA) 10 MG tablet Take 10 mg by mouth daily.     furosemide (LASIX) 20 MG tablet TAKE 1 TABLET(20 MG) BY MOUTH DAILY 90 tablet 0   gabapentin (NEURONTIN) 600 MG tablet Take 1 tablet (600 mg total) by mouth at bedtime. As needed 90 tablet 1   ipratropium (ATROVENT) 0.02 % nebulizer solution Take 2.5 mLs (0.5 mg total) by nebulization 4 (four) times daily. 100 mL 12   levothyroxine (SYNTHROID) 75 MCG tablet Take 1 tablet (75 mcg total) by mouth daily. 90 tablet 1   LINZESS 290 MCG CAPS capsule Take 290 mcg by mouth daily.     lisinopril (ZESTRIL) 5 MG tablet Take 5 mg by mouth daily.     meloxicam (MOBIC) 15 MG tablet Take 15 mg by mouth daily.     metoprolol tartrate (LOPRESSOR) 50 MG tablet TAKE 1 TABLET(50 MG) BY MOUTH TWICE DAILY WITH MEALS 180 tablet 1   Multiple Vitamins-Minerals (MULTIVITAMIN WITH MINERALS) tablet Take 1 tablet by mouth daily.     nitroGLYCERIN (NITROSTAT) 0.4 MG SL tablet PLACE 1 TABLET UNDER THE TONGUE EVERY 5 MINUTES AS NEEDED FOR CHEST PAIN. 25 tablet 1   omeprazole (PRILOSEC) 40 MG capsule Take 1 capsule (40 mg total) by mouth daily. 90 capsule 1   oxyCODONE-acetaminophen (PERCOCET) 10-325 MG tablet Take by mouth.     REPATHA SURECLICK 354 MG/ML SOAJ Inject into the skin.     tirzepatide (MOUNJARO) 7.5 MG/0.5ML Pen Inject 7.5 mg into the skin  once a week. 6 mL 0   tiZANidine (ZANAFLEX) 4 MG tablet Take 4 mg by mouth 3 (three) times daily.     tretinoin (RETIN-A) 0.05 % cream tretinoin 0.05 % topical cream  trimethoprim (TRIMPEX) 100 MG tablet Take 100 mg by mouth at bedtime.     vitamin E 180 MG (400 UNITS) capsule Take 400 Units by mouth daily.     ciprofloxacin (CIPRO) 500 MG tablet Take 500 mg by mouth 2 (two) times daily as needed.     nitrofurantoin, macrocrystal-monohydrate, (MACROBID) 100 MG capsule Take 1 capsule (100 mg total) by mouth 2 (two) times daily. 10 capsule 0   No facility-administered medications prior to visit.    Allergies  Allergen Reactions   Levofloxacin Hives, Shortness Of Breath, Swelling and Other (See Comments)    RASH IN MOUTH   (can take IV route)   Augmentin [Amoxicillin-Pot Clavulanate]     Nausea   Bactrim [Sulfamethoxazole-Trimethoprim] Hives and Itching   Clarithromycin     GI upset Other reaction(s): GI Upset (intolerance) GI upset -can take but does cause mild upset    Lyrica [Pregabalin]    Nexletol [Bempedoic Acid]     Muscle pain   Other Other (See Comments)   Repatha [Evolocumab]     Legs hurt.    Statins Other (See Comments)    Myalgias   Zetia [Ezetimibe]     Muscle pain    Review of Systems  See pertinent positives and negatives per HPI.     Objective:    Physical Exam Cardiovascular:     Rate and Rhythm: Normal rate and regular rhythm.  Pulmonary:     Effort: Pulmonary effort is normal.     Breath sounds: Normal breath sounds.  Abdominal:     General: Bowel sounds are normal. There is no distension.     Palpations: Abdomen is soft. There is no mass.     Tenderness: There is abdominal tenderness (suprapubic tenderness). There is no right CVA tenderness, left CVA tenderness, guarding or rebound.     Hernia: No hernia is present.  Neurological:     Mental Status: She is oriented to person, place, and time.  Psychiatric:        Mood and Affect: Mood normal.         Behavior: Behavior normal.        Thought Content: Thought content normal.     BP 118/72 (BP Location: Right Arm, Patient Position: Sitting)   Pulse 74   Temp 97.9 F (36.6 C) (Temporal)   Ht _0  (1.651 m)   Wt 149 lb 6.4 oz (67.8 kg)   SpO2 100%   BMI 24.86 kg/m  Wt Readings from Last 3 Encounters:  06/08/22 149 lb 6.4 oz (67.8 kg)  05/20/22 151 lb (68.5 kg)  04/25/22 154 lb (69.9 kg)    Health Maintenance Due  Topic Date Due   DTaP/Tdap/Td (1 - Tdap) Never done   Zoster Vaccines- Shingrix (1 of 2) Never done   COLONOSCOPY (Pts 45-30yr Insurance coverage will need to be confirmed)  12/01/2021    There are no preventive care reminders to display for this patient.   Lab Results  Component Value Date   TSH 2.270 02/08/2022   Lab Results  Component Value Date   WBC 5.7 04/25/2022   HGB 13.4 04/25/2022   HCT 39.3 04/25/2022   MCV 91 04/25/2022   PLT 241 04/25/2022   Lab Results  Component Value Date   NA 138 04/25/2022   K 4.6 04/25/2022   CO2 25 04/25/2022   GLUCOSE 96 04/25/2022   BUN 12 04/25/2022   CREATININE 0.97 04/25/2022   BILITOT 0.3 04/25/2022  ALKPHOS 91 04/25/2022   AST 40 04/25/2022   ALT 41 (H) 04/25/2022   PROT 7.3 04/25/2022   ALBUMIN 4.6 04/25/2022   CALCIUM 10.2 04/25/2022   ANIONGAP 9 09/04/2018   EGFR 61 04/25/2022   Lab Results  Component Value Date   CHOL 323 (H) 04/25/2022   Lab Results  Component Value Date   HDL 55 04/25/2022   Lab Results  Component Value Date   LDLCALC 241 (H) 04/25/2022   Lab Results  Component Value Date   TRIG 143 04/25/2022   Lab Results  Component Value Date   CHOLHDL 5.9 (H) 04/25/2022   Lab Results  Component Value Date   HGBA1C 5.3 02/08/2022       Assessment & Plan:   Problem List Items Addressed This Visit       Other   Difficulty urinating    UA negative for RBC, WBC and nitrates Recently finished the course of antibiotics macrobid and tolerated well Suggested  to push through lots of fluids      Chronic low back pain - Primary    Prescription for Short term prednisone sent  Patient is following to neurosurgery and low spine Dr. Newman Pies She is due for CT lumbar and spine  in 2 days      Relevant Medications   tiZANidine (ZANAFLEX) 4 MG tablet   meloxicam (MOBIC) 15 MG tablet   predniSONE (DELTASONE) 10 MG tablet   RESOLVED: Dysuria   Relevant Orders   POCT URINALYSIS DIP (CLINITEK) (Completed)      Follow-up:  as needed  An After Visit Summary was printed and given to the patient.  Neil Crouch, DNP, [place] [phone]

## 2022-06-08 NOTE — Assessment & Plan Note (Signed)
UA negative for RBC, WBC and nitrates Recently finished the course of antibiotics macrobid and tolerated well Suggested to push through lots of fluids

## 2022-06-08 NOTE — Patient Instructions (Addendum)
Patient instructions:  Increase fluid intake to a minimum of 8, 8 ounce glasses of water per day to help flush the kidneys and bladder.  The medication Pyridium may help with symptoms of bladder spasms, abdominal pain, and burning. This medication will turn your urine bright orange. This is expected and nothing to be concerned about. I have prescribed this for you in addition to the antibiotic.   If you begin to have worsening symptoms, low back pain, fevers, chills, nausea, vomiting, diarrhea, or decreased urine, please let us know.  Please finish all of your antibiotic even if you begin to feel better before you have completed the full course. It takes the full dose to be completely effective against the bacteria present. Stopping the antibiotic early can lead to the bacteria becoming resistant to the antibiotic and puts you at risk of the antibiotic not working for you in the future.    Asymptomatic Bacteriuria Asymptomatic bacteriuria is the presence of a large number of bacteria in the urine without the usual symptoms of burning or frequent urination. This is usually not harmful, and treatment may not be needed. A person with this condition will not be more likely to develop an infection in the future. What are the causes? This condition is caused by an increase in bacteria in the urine. This increase can be caused by: Bacteria entering the urinary tract, such as during sex. A blockage in the urinary tract, such as from kidney stones or a tumor. Bladder problems that prevent the bladder from emptying. What increases the risk? You are more likely to develop this condition if: You have diabetes. You are an older adult. This especially affects older adults in long-term care facilities. You are pregnant and in the first trimester. You have kidney stones. You are female. You have had a kidney transplant. You have a leaky kidney tube valve (reflux). You had a urinary catheter for a long  period of time. This is a long, thin tube that collects urine. What are the signs or symptoms? There are no symptoms of this condition. How is this diagnosed? This condition is diagnosed with a urine test. Because this condition does not cause symptoms, it is usually diagnosed when a urine sample is taken to treat or diagnose another condition, such as pregnancy or kidney problems. Most women who are in their first trimester of pregnancy are screened for asymptomatic bacteriuria. How is this treated? Usually, treatment is not needed for this condition. Treating the condition can lead to other problems, such as a yeast infection or the growth of bacteria that do not respond to treatment (antibiotic-resistant bacteria). Some people do need treatment with antibiotic medicines to prevent kidney infection, known as pyelonephritis. Treatment is needed if: You are pregnant. In pregnant women, kidney infection can lead to: Early labor (premature labor). Very low birth weight (fetal growth restriction). Newborn death. You are having a procedure that affects the urinary tract. You have had a kidney transplant. If you are diagnosed with this condition, talk with your health care provider about any concerns that you have. Follow these instructions at home: Medicines Take over-the-counter and prescription medicines only as told by your health care provider. If you were prescribed an antibiotic medicine, take it as told by your health care provider. Do not stop using the antibiotic even if you start to feel better. General instructions Monitor your condition for any changes. Drink enough fluid to keep your urine pale yellow. Urinate more often to keep your  bladder empty. If you are female, keep the area around your vagina and rectum clean. Wipe from front to back after urinating or having a bowel movement. Use each piece of toilet paper only once. Keep all follow-up visits. This is important. Contact a  health care provider if: You have symptoms of a urine infection, such as: A burning sensation, or pain when you urinate. A strong need to urinate, or urinating more often. Urine turning discolored or cloudy. Blood in your urine. Urine that smells bad. Get help right away if: You develop signs of a kidney infection, such as: Back pain or pelvic pain. A fever or chills. Nausea or vomiting. Severe pain that cannot be controlled with medicine. Summary Asymptomatic bacteriuria is the presence of a large number of bacteria in the urine without the usual symptoms of burning or frequent urination. Usually, treatment is not needed for this condition. Treating the condition can lead to other problems, such as a yeast infection or the growth of bacteria that do not respond to treatment. Some people do need treatment. Treatment is needed if you are pregnant, if you are having a procedure that affects the urinary tract, or if you have had a kidney transplant. If you were prescribed an antibiotic medicine, take it as told by your health care provider. Do not stop using the antibiotic even if you start to feel better. This information is not intended to replace advice given to you by your health care provider. Make sure you discuss any questions you have with your health care provider. Document Revised: 01/17/2020 Document Reviewed: 01/17/2020 Elsevier Patient Education  [employer].

## 2022-06-08 NOTE — Assessment & Plan Note (Addendum)
Prescription for Short term prednisone sent  Patient is following to neurosurgery and low spine Dr. Newman Pies She is due for CT lumbar and spine  in 2 days

## 2022-06-10 ENCOUNTER — Ambulatory Visit
Admission: RE | Admit: 2022-06-10 | Discharge: 2022-06-10 | Disposition: A | Discharge status: Home / Self Care | Payer: Medicare Other | Source: Ambulatory Visit | Attending: Neurosurgery | Admitting: Neurosurgery

## 2022-06-10 DIAGNOSIS — M5126 Other intervertebral disc displacement, lumbar region: Secondary | ICD-10-CM | POA: Diagnosis not present

## 2022-06-10 DIAGNOSIS — M4316 Spondylolisthesis, lumbar region: Secondary | ICD-10-CM | POA: Diagnosis not present

## 2022-06-10 DIAGNOSIS — Z981 Arthrodesis status: Secondary | ICD-10-CM | POA: Diagnosis not present

## 2022-06-10 DIAGNOSIS — M48061 Spinal stenosis, lumbar region without neurogenic claudication: Secondary | ICD-10-CM | POA: Diagnosis not present

## 2022-06-10 DIAGNOSIS — M48062 Spinal stenosis, lumbar region with neurogenic claudication: Secondary | ICD-10-CM

## 2022-06-10 MED ORDER — MEPERIDINE HCL 50 MG/ML IJ SOLN
50.0000 mg | Freq: Once | INTRAMUSCULAR | Status: AC | PRN
  Administered 2022-06-10: 50 mg via INTRAMUSCULAR

## 2022-06-10 MED ORDER — IOPAMIDOL (ISOVUE-M 200) INJECTION 41%
15.0000 mL | Freq: Once | INTRAMUSCULAR | Status: AC
  Administered 2022-06-10: 15 mL via INTRATHECAL

## 2022-06-10 MED ORDER — ONDANSETRON HCL 4 MG/2ML IJ SOLN
4.0000 mg | Freq: Once | INTRAMUSCULAR | Status: AC | PRN
  Administered 2022-06-10: 4 mg via INTRAMUSCULAR

## 2022-06-10 MED ORDER — DIAZEPAM 5 MG PO TABS
5.0000 mg | ORAL_TABLET | Freq: Once | ORAL | Status: AC
  Administered 2022-06-10: 5 mg via ORAL

## 2022-06-10 NOTE — Discharge Instructions (Signed)

## 2022-06-10 NOTE — Discharge Instr - Other Info (Addendum)
1328: Pain 10/10 post myelogram procedure, see MAR  1400: Pain 4/10, relief noted.

## 2022-06-11 ENCOUNTER — Other Ambulatory Visit: Payer: Self-pay | Admitting: Family Medicine

## 2022-06-14 DIAGNOSIS — M5137 Other intervertebral disc degeneration, lumbosacral region: Secondary | ICD-10-CM | POA: Diagnosis not present

## 2022-06-14 DIAGNOSIS — M48062 Spinal stenosis, lumbar region with neurogenic claudication: Secondary | ICD-10-CM | POA: Diagnosis not present

## 2022-06-14 DIAGNOSIS — M4317 Spondylolisthesis, lumbosacral region: Secondary | ICD-10-CM | POA: Diagnosis not present

## 2022-06-22 ENCOUNTER — Other Ambulatory Visit: Payer: Self-pay | Admitting: Neurosurgery

## 2022-07-01 ENCOUNTER — Other Ambulatory Visit: Payer: Self-pay | Admitting: Neurosurgery

## 2022-07-05 NOTE — Progress Notes (Signed)
Subjective:  Patient ID: Courtney [name], female    DOB: [birth year]  Age: 77 y.o. MRN: [id number]  Chief Complaint  Patient presents with   Anxiety   HPI: Patient following up with worsening anxiety. Patient has been on wellbutrin xl, prozac, zoloft, and paxil. None helped. Patient has not tried duloxetine or effexor xr. Patient is not on depression, but does have severe anxiety.   Patient is scheduled to have back surgery in February. She just completed prednisone burst. She has been taking prednisone 20 mg daily, but has run out. Per patient, neurosurgeon said she could get prednisone refill from PCP. We called to confirm that the neurosurgeon was ok with having her on prednisone up until her surgery.  Patient has been on mounjaro for prediabetes and wt management. She is requesting an increase in her dose.  Patient has lost 30 lbs in the last year with the help of mounjaro dropping from a bmi of 30 to 25.Patient has known CORONARY ARTERY DISEASE, hyperlipidemia, and hypertension.       07/06/2022    9:43 AM 12/07/2021    3:54 PM  GAD 7 : Generalized Anxiety Score  Nervous, Anxious, on Edge 3 3  Control/stop worrying 3 3  Worry too much - different things 3 3  Trouble relaxing 3 3  Restless 3 3  Easily annoyed or irritable 3 3  Afraid - awful might happen 3 3  Total GAD 7 Score 21 21  Anxiety Difficulty Extremely difficult Very difficult        07/06/2022    9:45 AM 04/25/2022    8:57 AM 02/08/2022    9:18 AM 11/08/2021    8:46 AM 07/19/2021    8:53 AM  Depression screen PHQ 2/9  Decreased Interest 3 0 0 1 3  Down, Depressed, Hopeless 3 0 0 1 1  PHQ - 2 Score 6 0 0 2 4  Altered sleeping 3 0 '2 3 3  '$ Tired, decreased energy '3 3 3 3 3  '$ Change in appetite 1  0 0 0  Feeling bad or failure about yourself  3 0 2 0 1  Trouble concentrating 3 0 0 0 1  Moving slowly or fidgety/restless 0 0 0 0 0  Suicidal thoughts 0 0 0 0 0  PHQ-9 Score '19 3 7 8 12  '$ Difficult doing work/chores Very  difficult Not difficult at all  Somewhat difficult Very difficult        Current Outpatient Medications on File Prior to Visit  Medication Sig Dispense Refill   albuterol (VENTOLIN HFA) 108 (90 Base) MCG/ACT inhaler Inhale into the lungs.     amLODipine (NORVASC) 5 MG tablet Take 5 mg by mouth daily.     budesonide-formoterol (SYMBICORT) 160-4.5 MCG/ACT inhaler Inhale 2 puffs into the lungs in the morning and at bedtime. 1 each 6   cetirizine (ZYRTEC) 10 MG tablet Take 1 tablet (10 mg total) by mouth daily. 90 tablet 3   Cholecalciferol (D3-50 PO) Take by mouth.     D-Mannose 350 MG CAPS Take 350 mg by mouth daily.     furosemide (LASIX) 20 MG tablet TAKE 1 TABLET(20 MG) BY MOUTH DAILY 90 tablet 0   gabapentin (NEURONTIN) 600 MG tablet Take 1 tablet (600 mg total) by mouth at bedtime. As needed 90 tablet 1   ipratropium (ATROVENT) 0.02 % nebulizer solution Take 2.5 mLs (0.5 mg total) by nebulization 4 (four) times daily. 100 mL 12   levothyroxine (SYNTHROID)  75 MCG tablet TAKE 1 TABLET(75 MCG) BY MOUTH DAILY 90 tablet 1   lisinopril (ZESTRIL) 5 MG tablet Take 5 mg by mouth daily.     meloxicam (MOBIC) 15 MG tablet Take 15 mg by mouth daily.     metoprolol tartrate (LOPRESSOR) 50 MG tablet TAKE 1 TABLET(50 MG) BY MOUTH TWICE DAILY WITH MEALS 180 tablet 1   Multiple Vitamins-Minerals (MULTIVITAMIN WITH MINERALS) tablet Take 1 tablet by mouth daily.     nitroGLYCERIN (NITROSTAT) 0.4 MG SL tablet PLACE 1 TABLET UNDER THE TONGUE EVERY 5 MINUTES AS NEEDED FOR CHEST PAIN. 25 tablet 1   omeprazole (PRILOSEC) 40 MG capsule Take 1 capsule (40 mg total) by mouth daily. 90 capsule 1   oxyCODONE-acetaminophen (PERCOCET) 10-325 MG tablet Take by mouth.     REPATHA SURECLICK 294 MG/ML SOAJ Inject into the skin.     tiZANidine (ZANAFLEX) 4 MG tablet Take 4 mg by mouth 3 (three) times daily.     tretinoin (RETIN-A) 0.05 % cream tretinoin 0.05 % topical cream     vitamin E 180 MG (400 UNITS) capsule Take  400 Units by mouth daily.     No current facility-administered medications on file prior to visit.   Past Medical History:  Diagnosis Date   Anemia    Arthritis    Bronchiectasis ([place])    CAP (community acquired pneumonia)  vs Eosinophilic Pna 12/24/5463   Followed in Pulmonary clinic/ [facility]/ [place]    Cardiac dysrhythmia    Chronic idiopathic constipation    Colitis, acute 02/08/2021   Depression, major, recurrent, moderate (HCC)    Headache    Heart murmur    History of bladder infections    History of COVID-19    Hyperlipidemia    Hypertension    Hypothyroidism    Knee pain, bilateral    Melena 02/08/2021   Osteoarthritis    Pneumonia    Primary insomnia    Recovering alcoholic (HCC)    SVT (supraventricular tachycardia)    UTI (urinary tract infection)    Varicose veins    Vitamin B12 deficiency    Past Surgical History:  Procedure Laterality Date   ABDOMINAL HYSTERECTOMY  1991   ANGIOPLASTY     at least 15 years ago, pt. denies    APPENDECTOMY     AUGMENTATION MAMMAPLASTY Bilateral    BACK SURGERY     had 2 surgeries. Have rods placed in back    BARIATRIC SURGERY     lap band-at least 14 years ago   BREAST BIOPSY Left    x2   BREAST ENHANCEMENT SURGERY  2006   CHOLECYSTECTOMY  12/2019   removed lap band.    COLONOSCOPY  12/01/2016   Moderate predominantly sigmod diverticulosis. Otherwise grossly normal colonoscopy   EYE SURGERY     IOL- bilateral - [place]   KNEE ARTHROSCOPY Left    lap band surgery  1981   LUMBAR FUSION  07/29/2015   posterior level one   removal of cervical disc fragments  10/2007   pt. denies     Family History  Problem Relation Age of Onset   Heart disease Mother    Heart disease Father    Diabetes Sister    Other Sister        BRAIN TUMOR   Heart disease Brother    Heart disease Brother    Heart disease Brother    Heart disease Brother    Heart disease Brother  Colon cancer Maternal Aunt    Colon cancer  Cousin    Social History   Socioeconomic History   Marital status: Married    Spouse name: Not on file   Number of children: 2   Years of education: Not on file   Highest education level: Not on file  Occupational History   Occupation: retired    Comment: Marine scientist  Tobacco Use   Smoking status: Never   Smokeless tobacco: Never  Vaping Use   Vaping Use: Never used  Substance and Sexual Activity   Alcohol use: No    Alcohol/week: 0.0 standard drinks of alcohol    Comment: recovery x 20 yrs   Drug use: No   Sexual activity: Not Currently    Comment: MARRIED  Other Topics Concern   Not on file  Social History Narrative   Not on file   Social Determinants of Health   Financial Resource Strain: Medium Risk (11/08/2021)   Overall Financial Resource Strain (CARDIA)    Difficulty of Paying Living Expenses: Somewhat hard  Food Insecurity: No Food Insecurity (11/18/2020)   Hunger Vital Sign    Worried About Running Out of Food in the Last Year: Never true    Ran Out of Food in the Last Year: Never true  Transportation Needs: No Transportation Needs (11/08/2021)   PRAPARE - Hydrologist (Medical): No    Lack of Transportation (Non-Medical): No  Physical Activity: Not on file  Stress: Not on file  Social Connections: Not on file    Review of Systems  Constitutional:  Negative for appetite change and fever.  HENT:  Negative for congestion.   Respiratory:  Negative for cough, shortness of breath and wheezing.   Cardiovascular:  Negative for chest pain.  Gastrointestinal:  Positive for constipation.  Genitourinary:  Negative for dysuria.  Musculoskeletal:  Positive for arthralgias and back pain.  Skin:  Negative for rash.  Neurological:  Negative for headaches.  Psychiatric/Behavioral:  Positive for dysphoric mood. The patient is nervous/anxious.      Objective:  BP 120/64   Pulse 80   Temp (!) 96.9 F (36.1 C)   Resp 16   Ht '5\' 5"'$  (1.651 m)    Wt 150 lb (68 kg)   BMI 24.96 kg/m      07/06/2022    9:34 AM 06/10/2022    2:00 PM 06/10/2022   12:44 PM  BP/Weight  Systolic BP 096 045 409  Diastolic BP 64 62 71  Wt. (Lbs) 150    BMI 24.96 kg/m2      Physical Exam Vitals reviewed.  Constitutional:      Appearance: Normal appearance. She is normal weight.  Cardiovascular:     Rate and Rhythm: Normal rate and regular rhythm.     Heart sounds: Normal heart sounds.  Pulmonary:     Effort: Pulmonary effort is normal.     Breath sounds: Normal breath sounds.  Neurological:     Mental Status: She is alert and oriented to person, place, and time.  Psychiatric:        Mood and Affect: Mood normal.        Behavior: Behavior normal.     Diabetic Foot Exam - Simple   No data filed      Lab Results  Component Value Date   WBC 5.7 04/25/2022   HGB 13.4 04/25/2022   HCT 39.3 04/25/2022   PLT 241 04/25/2022   GLUCOSE 96 04/25/2022  CHOL 323 (H) 04/25/2022   TRIG 143 04/25/2022   HDL 55 04/25/2022   LDLCALC 241 (H) 04/25/2022   ALT 41 (H) 04/25/2022   AST 40 04/25/2022   NA 138 04/25/2022   K 4.6 04/25/2022   CL 98 04/25/2022   CREATININE 0.97 04/25/2022   BUN 12 04/25/2022   CO2 25 04/25/2022   TSH 2.270 02/08/2022   HGBA1C 5.3 02/08/2022      Assessment & Plan:   Asha was seen today for anxiety.  Prediabetes Assessment & Plan: Increase mounjaro 10 mg weekly.  Continue to eat healthy.  Orders: -     DULoxetine HCl; Take 1 capsule (30 mg total) by mouth daily for 3 days, THEN 2 capsules (60 mg total) daily for 27 days.  Dispense: 60 capsule; Refill: 0 -     Tirzepatide; Inject 10 mg into the skin once a week.  Dispense: 6 mL; Refill: 0  GAD (generalized anxiety disorder) Assessment & Plan: Start on cymbalta 30 mg once daily x 3 days, then increase to 60 mg daily.    Overweight with body mass index (BMI) of 25 to 25.9 in adult Assessment & Plan: BMI improved from 30 (180 lbs) down to BMI 25  (150 lbx.) Patient would like to increase dose of mounjaro 10 mg weekly She would like to lose 10 more lbs. Comorbities: hypertension, cad, hyperlipidemia.   Orders: -     DULoxetine HCl; Take 1 capsule (30 mg total) by mouth daily for 3 days, THEN 2 capsules (60 mg total) daily for 27 days.  Dispense: 60 capsule; Refill: 0  Spinal stenosis of lumbar region with neurogenic claudication Assessment & Plan: Prescription: prednisone 10 mg daily (may increase to 2 daily per day if needed.)      Meds ordered this encounter  Medications   DULoxetine (CYMBALTA) 30 MG capsule    Sig: Take 1 capsule (30 mg total) by mouth daily for 3 days, THEN 2 capsules (60 mg total) daily for 27 days.    Dispense:  60 capsule    Refill:  0   tirzepatide (MOUNJARO) 10 MG/0.5ML Pen    Sig: Inject 10 mg into the skin once a week.    Dispense:  6 mL    Refill:  0      Follow-up: Return in about 4 weeks (around 08/03/2022) for chronic fasting.  An After Visit Summary was printed and given to the patient.  Courtney Brome, MD [facility] [phone]

## 2022-07-06 ENCOUNTER — Ambulatory Visit (INDEPENDENT_AMBULATORY_CARE_PROVIDER_SITE_OTHER): Payer: Medicare Other | Admitting: Family Medicine

## 2022-07-06 VITALS — BP 120/64 | HR 80 | Temp 96.9°F | Resp 16 | Ht 65.0 in | Wt 150.0 lb

## 2022-07-06 DIAGNOSIS — Z6825 Body mass index (BMI) 25.0-25.9, adult: Secondary | ICD-10-CM

## 2022-07-06 DIAGNOSIS — R7303 Prediabetes: Secondary | ICD-10-CM | POA: Diagnosis not present

## 2022-07-06 DIAGNOSIS — E663 Overweight: Secondary | ICD-10-CM

## 2022-07-06 DIAGNOSIS — F411 Generalized anxiety disorder: Secondary | ICD-10-CM

## 2022-07-06 DIAGNOSIS — F332 Major depressive disorder, recurrent severe without psychotic features: Secondary | ICD-10-CM

## 2022-07-06 DIAGNOSIS — M48062 Spinal stenosis, lumbar region with neurogenic claudication: Secondary | ICD-10-CM | POA: Diagnosis not present

## 2022-07-06 MED ORDER — DULOXETINE HCL 30 MG PO CPEP: ORAL_CAPSULE | ORAL | 0 refills | Status: DC

## 2022-07-06 MED ORDER — TIRZEPATIDE 10 MG/0.5ML ~~LOC~~ SOAJ: 10.0000 mg | SUBCUTANEOUS | 0 refills | Status: DC

## 2022-07-06 NOTE — Patient Instructions (Signed)
Start on duloxetine (cymbalta) 30 mg daily x 3 days then increase to cymbalta 30 mg 2 daily.   Increase mounjaro 10 mg weekly.

## 2022-07-07 ENCOUNTER — Other Ambulatory Visit: Payer: Self-pay | Admitting: Family Medicine

## 2022-07-07 ENCOUNTER — Telehealth: Payer: Self-pay

## 2022-07-07 MED ORDER — PREDNISONE 10 MG PO TABS: 10.0000 mg | ORAL_TABLET | Freq: Every day | ORAL | 0 refills | Status: DC

## 2022-07-07 NOTE — Telephone Encounter (Signed)
Patient was informed.

## 2022-07-07 NOTE — Telephone Encounter (Signed)
Courtney [name] from the office of Dr Arnoldo Morale said that he is fine for patient to take prednisone.

## 2022-07-10 ENCOUNTER — Encounter: Payer: Self-pay | Admitting: Family Medicine

## 2022-07-10 DIAGNOSIS — F411 Generalized anxiety disorder: Secondary | ICD-10-CM | POA: Insufficient documentation

## 2022-07-10 DIAGNOSIS — E663 Overweight: Secondary | ICD-10-CM | POA: Insufficient documentation

## 2022-07-10 NOTE — Assessment & Plan Note (Signed)
Prescription: prednisone 10 mg daily (may increase to 2 daily per day if needed.)

## 2022-07-10 NOTE — Assessment & Plan Note (Signed)
Start on cymbalta 30 mg once daily x 3 days, then increase to 60 mg daily.

## 2022-07-10 NOTE — Assessment & Plan Note (Signed)
BMI improved from 30 (180 lbs) down to BMI 25 (150 lbx.) Patient would like to increase dose of mounjaro 10 mg weekly She would like to lose 10 more lbs. Comorbities: hypertension, cad, hyperlipidemia.

## 2022-07-10 NOTE — Assessment & Plan Note (Signed)
Increase mounjaro 10 mg weekly.  Continue to eat healthy.

## 2022-07-13 ENCOUNTER — Ambulatory Visit (INDEPENDENT_AMBULATORY_CARE_PROVIDER_SITE_OTHER): Payer: Medicare Other | Admitting: Nurse Practitioner

## 2022-07-13 VITALS — BP 118/70 | HR 72 | Temp 97.2°F | Resp 16 | Ht 65.0 in | Wt 153.0 lb

## 2022-07-13 DIAGNOSIS — R3915 Urgency of urination: Secondary | ICD-10-CM | POA: Insufficient documentation

## 2022-07-13 DIAGNOSIS — N3000 Acute cystitis without hematuria: Secondary | ICD-10-CM | POA: Diagnosis not present

## 2022-07-13 LAB — POCT URINALYSIS DIP (CLINITEK)
Blood, UA: NEGATIVE
Glucose, UA: NEGATIVE mg/dL
Nitrite, UA: POSITIVE — AB
POC PROTEIN,UA: NEGATIVE
Spec Grav, UA: 1.015 (ref 1.010–1.025)
Urobilinogen, UA: 1 E.U./dL
pH, UA: 6 (ref 5.0–8.0)

## 2022-07-13 MED ORDER — CIPROFLOXACIN HCL 500 MG PO TABS: 500.0000 mg | ORAL_TABLET | Freq: Two times a day (BID) | ORAL | 0 refills | Status: AC

## 2022-07-13 MED ORDER — PHENAZOPYRIDINE HCL 100 MG PO TABS: 100.0000 mg | ORAL_TABLET | Freq: Three times a day (TID) | ORAL | 0 refills | Status: DC | PRN

## 2022-07-13 MED ORDER — KETOROLAC TROMETHAMINE 60 MG/2ML IM SOLN
60.0000 mg | Freq: Once | INTRAMUSCULAR | Status: AC
  Administered 2022-07-13: 60 mg via INTRAMUSCULAR

## 2022-07-13 NOTE — Progress Notes (Unsigned)
Acute Office Visit  Subjective:    Patient ID: DEAN WONDER, female    DOB: January 14, 1946, 77 y.o.   MRN: [id number]  Chief Complaints: Burning and urgency of urination  History of Present ilIness: Patient is in today for possible UTI.  Following urinary symptoms bothering her for 2-3 days. Patient has taken Azo to get some relief but did not help.  URINARY SYMPTOMS  Dysuria: yes Urinary frequency: yes Urgency: yes Small volume voids: yes Symptom severity: yes Urinary incontinence: yes Foul odor: yes Hematuria: no Abdominal pain: yes Back pain: yes Suprapubic pain/pressure: no Flank pain: yes Fever:  yes, no, subjective, and low grade Vomiting: no Relief with cranberry juice: yes Relief with pyridium: yes Status: better/worse/stable Previous urinary tract infection: yes Recurrent urinary tract infection: yes Sexual activity: No sexually active/monogomous/practicing safe sex History of sexually transmitted disease: no Treatments attempted: cranberry juices and OTC AZO    Past Medical History:  Diagnosis Date   Anemia    Arthritis    Bronchiectasis ([place])    CAP (community acquired pneumonia)  vs Eosinophilic Pna 09/18/6604   Followed in Pulmonary clinic/ [facility]/ [place]    Cardiac dysrhythmia    Chronic idiopathic constipation    Colitis, acute 02/08/2021   Depression, major, recurrent, moderate (HCC)    Headache    Heart murmur    History of bladder infections    History of COVID-19    Hyperlipidemia    Hypertension    Hypothyroidism    Knee pain, bilateral    Melena 02/08/2021   Osteoarthritis    Pneumonia    Primary insomnia    Recovering alcoholic (HCC)    SVT (supraventricular tachycardia)    UTI (urinary tract infection)    Varicose veins    Vitamin B12 deficiency     Past Surgical History:  Procedure Laterality Date   ABDOMINAL HYSTERECTOMY  1991   ANGIOPLASTY     at least 15 years ago, pt. denies    APPENDECTOMY     AUGMENTATION  MAMMAPLASTY Bilateral    BACK SURGERY     had 2 surgeries. Have rods placed in back    BARIATRIC SURGERY     lap band-at least 14 years ago   BREAST BIOPSY Left    x2   BREAST ENHANCEMENT SURGERY  2006   CHOLECYSTECTOMY  12/2019   removed lap band.    COLONOSCOPY  12/01/2016   Moderate predominantly sigmod diverticulosis. Otherwise grossly normal colonoscopy   EYE SURGERY     IOL- bilateral - [place]   KNEE ARTHROSCOPY Left    lap band surgery  1981   LUMBAR FUSION  07/29/2015   posterior level one   removal of cervical disc fragments  10/2007   pt. denies     Family History  Problem Relation Age of Onset   Heart disease Mother    Heart disease Father    Diabetes Sister    Other Sister        BRAIN TUMOR   Heart disease Brother    Heart disease Brother    Heart disease Brother    Heart disease Brother    Heart disease Brother    Colon cancer Maternal Aunt    Colon cancer Cousin     Social History   Socioeconomic History   Marital status: Married    Spouse name: Not on file   Number of children: 2   Years of education: Not on file   Highest education  level: Not on file  Occupational History   Occupation: retired    Comment: Marine scientist  Tobacco Use   Smoking status: Never   Smokeless tobacco: Never  Vaping Use   Vaping Use: Never used  Substance and Sexual Activity   Alcohol use: No    Alcohol/week: 0.0 standard drinks of alcohol    Comment: recovery x 20 yrs   Drug use: No   Sexual activity: Not Currently    Comment: MARRIED  Other Topics Concern   Not on file  Social History Narrative   Not on file   Social Determinants of Health   Financial Resource Strain: Medium Risk (11/08/2021)   Overall Financial Resource Strain (CARDIA)    Difficulty of Paying Living Expenses: Somewhat hard  Food Insecurity: No Food Insecurity (11/18/2020)   Hunger Vital Sign    Worried About Running Out of Food in the Last Year: Never true    Ran Out of Food in the Last  Year: Never true  Transportation Needs: No Transportation Needs (11/08/2021)   PRAPARE - Hydrologist (Medical): No    Lack of Transportation (Non-Medical): No  Physical Activity: Not on file  Stress: Not on file  Social Connections: Not on file  Intimate Partner Violence: Not on file    Outpatient Medications Prior to Visit  Medication Sig Dispense Refill   albuterol (VENTOLIN HFA) 108 (90 Base) MCG/ACT inhaler Inhale into the lungs.     amLODipine (NORVASC) 5 MG tablet Take 5 mg by mouth daily.     budesonide-formoterol (SYMBICORT) 160-4.5 MCG/ACT inhaler Inhale 2 puffs into the lungs in the morning and at bedtime. 1 each 6   cetirizine (ZYRTEC) 10 MG tablet Take 1 tablet (10 mg total) by mouth daily. 90 tablet 3   Cholecalciferol (D3-50 PO) Take by mouth.     D-Mannose 350 MG CAPS Take 350 mg by mouth daily.     DULoxetine (CYMBALTA) 30 MG capsule Take 1 capsule (30 mg total) by mouth daily for 3 days, THEN 2 capsules (60 mg total) daily for 27 days. 60 capsule 0   furosemide (LASIX) 20 MG tablet TAKE 1 TABLET(20 MG) BY MOUTH DAILY 90 tablet 0   gabapentin (NEURONTIN) 600 MG tablet Take 1 tablet (600 mg total) by mouth at bedtime. As needed 90 tablet 1   ipratropium (ATROVENT) 0.02 % nebulizer solution Take 2.5 mLs (0.5 mg total) by nebulization 4 (four) times daily. 100 mL 12   levothyroxine (SYNTHROID) 75 MCG tablet TAKE 1 TABLET(75 MCG) BY MOUTH DAILY 90 tablet 1   lisinopril (ZESTRIL) 5 MG tablet Take 5 mg by mouth daily.     meloxicam (MOBIC) 15 MG tablet Take 15 mg by mouth daily.     metoprolol tartrate (LOPRESSOR) 50 MG tablet TAKE 1 TABLET(50 MG) BY MOUTH TWICE DAILY WITH MEALS 180 tablet 1   Multiple Vitamins-Minerals (MULTIVITAMIN WITH MINERALS) tablet Take 1 tablet by mouth daily.     nitroGLYCERIN (NITROSTAT) 0.4 MG SL tablet PLACE 1 TABLET UNDER THE TONGUE EVERY 5 MINUTES AS NEEDED FOR CHEST PAIN. 25 tablet 1   omeprazole (PRILOSEC) 40 MG  capsule Take 1 capsule (40 mg total) by mouth daily. 90 capsule 1   oxyCODONE-acetaminophen (PERCOCET) 10-325 MG tablet Take by mouth.     predniSONE (DELTASONE) 10 MG tablet Take 1 tablet (10 mg total) by mouth daily with breakfast. 30 tablet 0   REPATHA SURECLICK 161 MG/ML SOAJ Inject into the skin.  tirzepatide (MOUNJARO) 10 MG/0.5ML Pen Inject 10 mg into the skin once a week. 6 mL 0   tiZANidine (ZANAFLEX) 4 MG tablet Take 4 mg by mouth 3 (three) times daily.     tretinoin (RETIN-A) 0.05 % cream tretinoin 0.05 % topical cream     vitamin E 180 MG (400 UNITS) capsule Take 400 Units by mouth daily.     No facility-administered medications prior to visit.    Allergies  Allergen Reactions   Levofloxacin Hives, Shortness Of Breath, Swelling and Other (See Comments)    RASH IN MOUTH   (can take IV route)   Augmentin [Amoxicillin-Pot Clavulanate]     Nausea   Bactrim [Sulfamethoxazole-Trimethoprim] Hives and Itching   Clarithromycin     GI upset Other reaction(s): GI Upset (intolerance) GI upset -can take but does cause mild upset    Lyrica [Pregabalin]    Nexletol [Bempedoic Acid]     Muscle pain   Other Other (See Comments)   Repatha [Evolocumab]     Legs hurt.    Statins Other (See Comments)    Myalgias   Zetia [Ezetimibe]     Muscle pain   ROS: See pertinent positives and negatives per HPI.    Objective:    Physical Exam Vitals reviewed.  Constitutional:      Appearance: Normal appearance. She is normal weight.  Neck:     Vascular: No carotid bruit.  Cardiovascular:     Rate and Rhythm: Normal rate and regular rhythm.     Heart sounds: Normal heart sounds.  Pulmonary:     Effort: Pulmonary effort is normal.     Breath sounds: Normal breath sounds.  Abdominal:     General: Bowel sounds are normal. There is no distension.     Palpations: Abdomen is soft.     Tenderness: There is abdominal tenderness in the suprapubic area. There is no right CVA tenderness, left  CVA tenderness, guarding or rebound.     Comments: Suprapubic tenderness  Musculoskeletal:     Cervical back: Normal range of motion.  Neurological:     Mental Status: She is alert and oriented to person, place, and time.  Psychiatric:        Mood and Affect: Mood normal.        Behavior: Behavior normal.     BP 118/70   Pulse 72   Temp (!) 97.2 F (36.2 C)   Resp 16   Ht '5\' 5"'$  (1.651 m)   Wt 153 lb (69.4 kg)   SpO2 97%   BMI 25.46 kg/m  Wt Readings from Last 3 Encounters:  07/13/22 153 lb (69.4 kg)  07/06/22 150 lb (68 kg)  06/08/22 149 lb 6.4 oz (67.8 kg)    Health Maintenance Due  Topic Date Due   DTaP/Tdap/Td (1 - Tdap) Never done   Zoster Vaccines- Shingrix (1 of 2) Never done   COLONOSCOPY (Pts 45-99yr Insurance coverage will need to be confirmed)  12/01/2021    There are no preventive care reminders to display for this patient.   Lab Results  Component Value Date   TSH 2.270 02/08/2022   Lab Results  Component Value Date   WBC 5.7 04/25/2022   HGB 13.4 04/25/2022   HCT 39.3 04/25/2022   MCV 91 04/25/2022   PLT 241 04/25/2022   Lab Results  Component Value Date   NA 138 04/25/2022   K 4.6 04/25/2022   CO2 25 04/25/2022   GLUCOSE 96 04/25/2022  BUN 12 04/25/2022   CREATININE 0.97 04/25/2022   BILITOT 0.3 04/25/2022   ALKPHOS 91 04/25/2022   AST 40 04/25/2022   ALT 41 (H) 04/25/2022   PROT 7.3 04/25/2022   ALBUMIN 4.6 04/25/2022   CALCIUM 10.2 04/25/2022   ANIONGAP 9 09/04/2018   EGFR 61 04/25/2022   Lab Results  Component Value Date   CHOL 323 (H) 04/25/2022   Lab Results  Component Value Date   HDL 55 04/25/2022   Lab Results  Component Value Date   LDLCALC 241 (H) 04/25/2022   Lab Results  Component Value Date   TRIG 143 04/25/2022   Lab Results  Component Value Date   CHOLHDL 5.9 (H) 04/25/2022   Lab Results  Component Value Date   HGBA1C 5.3 02/08/2022       Assessment & Plan:  Acute cystitis without  hematuria Assessment & Plan: UA positive for UTI Start antibiotic for full course Drink Plenty of water  Take pyridium as needed  Increase fluid intake to a minimum of 8, 8 ounce glasses of water per day to help flush the kidneys and bladder.  The medication Pyridium may help with symptoms of bladder spasms, abdominal pain, and burning. This medication will turn your urine bright orange. This is expected and nothing to be concerned about. I have prescribed this for you in addition to the antibiotic.   If you begin to have worsening symptoms, low back pain, fevers, chills, nausea, vomiting, diarrhea, or decreased urine, please let us know.  Please finish all of your antibiotic even if you begin to feel better before you have completed the full course. It takes the full dose to be completely effective against the bacteria present. Stopping the antibiotic early can lead to the bacteria becoming resistant to the antibiotic and puts you at risk of the antibiotic not working for you in the future.     Orders: -     Ciprofloxacin HCl; Take 1 tablet (500 mg total) by mouth 2 (two) times daily for 7 days.  Dispense: 14 tablet; Refill: 0 -     Phenazopyridine HCl; Take 1 tablet (100 mg total) by mouth 3 (three) times daily as needed for pain.  Dispense: 10 tablet; Refill: 0 -     Ketorolac Tromethamine  Urgency of urination Assessment & Plan: UA positive for UTI Will send for culture Start antibiotic for full course Drink Plenty of water  Take pyridium as needed  Orders: -     POCT URINALYSIS DIP (CLINITEK) -     Urine Culture      Follow-up: as needed I, Rekha Aryal have reviewed all documentation for this visit. The documentation on 07/13/22   for the exam, diagnosis, procedures, and orders are all accurate and complete.      An After Visit Summary was printed and given to the patient.  Neil Crouch, DNP, [place] [phone]

## 2022-07-13 NOTE — Patient Instructions (Signed)
Increase fluid intake to a minimum of 8, 8 ounce glasses of water per day to help flush the kidneys and bladder.  The medication Pyridium may help with symptoms of bladder spasms, abdominal pain, and burning. This medication will turn your urine bright orange. This is expected and nothing to be concerned about. I have prescribed this for you in addition to the antibiotic.   If you begin to have worsening symptoms, low back pain, fevers, chills, nausea, vomiting, diarrhea, or decreased urine, please let us know.  Please finish all of your antibiotic even if you begin to feel better before you have completed the full course. It takes the full dose to be completely effective against the bacteria present. Stopping the antibiotic early can lead to the bacteria becoming resistant to the antibiotic and puts you at risk of the antibiotic not working for you in the future.

## 2022-07-13 NOTE — Assessment & Plan Note (Signed)
UA positive for UTI Start antibiotic for full course Drink Plenty of water  Take pyridium as needed  Increase fluid intake to a minimum of 8, 8 ounce glasses of water per day to help flush the kidneys and bladder.  The medication Pyridium may help with symptoms of bladder spasms, abdominal pain, and burning. This medication will turn your urine bright orange. This is expected and nothing to be concerned about. I have prescribed this for you in addition to the antibiotic.   If you begin to have worsening symptoms, low back pain, fevers, chills, nausea, vomiting, diarrhea, or decreased urine, please let us know.  Please finish all of your antibiotic even if you begin to feel better before you have completed the full course. It takes the full dose to be completely effective against the bacteria present. Stopping the antibiotic early can lead to the bacteria becoming resistant to the antibiotic and puts you at risk of the antibiotic not working for you in the future.

## 2022-07-13 NOTE — Assessment & Plan Note (Signed)
UA positive for UTI Will send for culture Start antibiotic for full course Drink Plenty of water  Take pyridium as needed

## 2022-07-14 ENCOUNTER — Encounter: Payer: Self-pay | Admitting: Nurse Practitioner

## 2022-07-16 LAB — URINE CULTURE

## 2022-07-19 NOTE — Pre-Procedure Instructions (Signed)
Surgical Instructions    Your procedure is scheduled on Wednesday, February 7th.  Report to [facility] Main Entrance "A" at 09:30 A.M., then check in with the Admitting office.  Call this number if you have problems the morning of surgery:  [phone]  If you have any questions prior to your surgery date call [phone]: Open Monday-Friday 8am-4pm If you experience any cold or flu symptoms such as cough, fever, chills, shortness of breath, etc. between now and your scheduled surgery, please notify Courtney [name] at the above number.     Remember:  Do not eat or drink after midnight the night before your surgery     Take these medicines the morning of surgery with A SIP OF WATER  amLODipine (NORVASC)  ciprofloxacin (CIPRO)  levothyroxine (SYNTHROID)  metoprolol tartrate (LOPRESSOR)  predniSONE (DELTASONE)    If needed: cetirizine (ZYRTEC)  ipratropium (ATROVENT)  nitroGLYCERIN (NITROSTAT)  omeprazole (PRILOSEC)  oxyCODONE-acetaminophen (PERCOCET)  phenazopyridine (PYRIDIUM)     As of today, STOP taking any Aspirin (unless otherwise instructed by your surgeon) Aleve, Naproxen, Ibuprofen, Motrin, Advil, Goody's, BC's, all herbal medications, fish oil, and all vitamins. This includes meloxicam (MOBIC).  WHAT DO I DO ABOUT MY DIABETES MEDICATION?   HOLD tirzepatide [facility]) for 7 days prior to surgery.     HOW TO MANAGE YOUR DIABETES BEFORE AND AFTER SURGERY  Why is it important to control my blood sugar before and after surgery? Improving blood sugar levels before and after surgery helps healing and can limit problems. A way of improving blood sugar control is eating a healthy diet by:  Eating less sugar and carbohydrates  Increasing activity/exercise  Talking with your doctor about reaching your blood sugar goals High blood sugars (greater than 180 mg/dL) can raise your risk of infections and slow your recovery, so you will need to focus on controlling your diabetes during  the weeks before surgery. Make sure that the doctor who takes care of your diabetes knows about your planned surgery including the date and location.  How do I manage my blood sugar before surgery? Check your blood sugar at least 4 times a day, starting 2 days before surgery, to make sure that the level is not too high or low.  Check your blood sugar the morning of your surgery when you wake up and every 2 hours until you get to the Short Stay unit.  If your blood sugar is less than 70 mg/dL, you will need to treat for low blood sugar: Do not take insulin. Treat a low blood sugar (less than 70 mg/dL) with  cup of clear juice (cranberry or apple), 4 glucose tablets, OR glucose gel. Recheck blood sugar in 15 minutes after treatment (to make sure it is greater than 70 mg/dL). If your blood sugar is not greater than 70 mg/dL on recheck, call [phone] for further instructions. Report your blood sugar to the short stay nurse when you get to Short Stay.  If you are admitted to the hospital after surgery: Your blood sugar will be checked by the staff and you will probably be given insulin after surgery (instead of oral diabetes medicines) to make sure you have good blood sugar levels. The goal for blood sugar control after surgery is 80-180 mg/dL.                     Do NOT Smoke (Tobacco/Vaping) for 24 hours prior to your procedure.  If you use a CPAP at night, you may  bring your mask/headgear for your overnight stay.   Contacts, glasses, piercing's, hearing aid's, dentures or partials may not be worn into surgery, please bring cases for these belongings.    For patients admitted to the hospital, discharge time will be determined by your treatment team.   Patients discharged the day of surgery will not be allowed to drive home, and someone needs to stay with them for 24 hours.  SURGICAL WAITING ROOM VISITATION Patients having surgery or a procedure may have no more than 2 support people in  the waiting area - these visitors may rotate.   Children under the age of 62 must have an adult with them who is not the patient. If the patient needs to stay at the hospital during part of their recovery, the visitor guidelines for inpatient rooms apply. Pre-op nurse will coordinate an appropriate time for 1 support person to accompany patient in pre-op.  This support person may not rotate.   Please refer to the [facility] website for the visitor guidelines for Inpatients (after your surgery is over and you are in a regular room).    Special instructions:   [place]- Preparing For Surgery  Before surgery, you can play an important role. Because skin is not sterile, your skin needs to be as free of germs as possible. You can reduce the number of germs on your skin by washing with CHG (chlorahexidine gluconate) Soap before surgery.  CHG is an antiseptic cleaner which kills germs and bonds with the skin to continue killing germs even after washing.    Oral Hygiene is also important to reduce your risk of infection.  Remember - BRUSH YOUR TEETH THE MORNING OF SURGERY WITH YOUR REGULAR TOOTHPASTE  Please do not use if you have an allergy to CHG or antibacterial soaps. If your skin becomes reddened/irritated stop using the CHG.  Do not shave (including legs and underarms) for at least 48 hours prior to first CHG shower. It is OK to shave your face.  Please follow these instructions carefully.   Shower the NIGHT BEFORE SURGERY and the MORNING OF SURGERY  If you chose to wash your hair, wash your hair first as usual with your normal shampoo.  After you shampoo, rinse your hair and body thoroughly to remove the shampoo.  Use CHG Soap as you would any other liquid soap. You can apply CHG directly to the skin and wash gently with a scrungie or a clean washcloth.   Apply the CHG Soap to your body ONLY FROM THE NECK DOWN.  Do not use on open wounds or open sores. Avoid contact with your eyes,  ears, mouth and genitals (private parts). Wash Face and genitals (private parts)  with your normal soap.   Wash thoroughly, paying special attention to the area where your surgery will be performed.  Thoroughly rinse your body with warm water from the neck down.  DO NOT shower/wash with your normal soap after using and rinsing off the CHG Soap.  Pat yourself dry with a CLEAN TOWEL.  Wear CLEAN PAJAMAS to bed the night before surgery  Place CLEAN SHEETS on your bed the night before your surgery  DO NOT SLEEP WITH PETS.   Day of Surgery: Take a shower with CHG soap. Do not wear jewelry or makeup Do not wear lotions, powders, perfumes, or deodorant. Do not shave 48 hours prior to surgery.   Do not bring valuables to the hospital. [facility] is not responsible for any belongings  or valuables. Do not wear nail polish, gel polish, artificial nails, or any other type of covering on natural nails (fingers and toes) If you have artificial nails or gel coating that need to be removed by a nail salon, please have this removed prior to surgery. Artificial nails or gel coating may interfere with anesthesia's ability to adequately monitor your vital signs. Wear Clean/Comfortable clothing the morning of surgery Remember to brush your teeth WITH YOUR REGULAR TOOTHPASTE.   Please read over the following fact sheets that you were given.    If you received a COVID test during your pre-op visit  it is requested that you wear a mask when out in public, stay away from anyone that may not be feeling well and notify your surgeon if you develop symptoms. If you have been in contact with anyone that has tested positive in the last 10 days please notify you surgeon.

## 2022-07-20 ENCOUNTER — Other Ambulatory Visit: Payer: Self-pay

## 2022-07-20 ENCOUNTER — Encounter (HOSPITAL_COMMUNITY)
Admission: RE | Admit: 2022-07-20 | Discharge: 2022-07-20 | Disposition: A | Discharge status: Home / Self Care | Payer: Medicare Other | Source: Ambulatory Visit | Attending: Neurosurgery | Admitting: Neurosurgery

## 2022-07-20 ENCOUNTER — Encounter (HOSPITAL_COMMUNITY): Payer: Self-pay

## 2022-07-20 VITALS — BP 108/60 | HR 69 | Temp 97.5°F | Resp 16 | Ht 65.0 in | Wt 149.6 lb

## 2022-07-20 DIAGNOSIS — Z01818 Encounter for other preprocedural examination: Secondary | ICD-10-CM

## 2022-07-20 DIAGNOSIS — Z01812 Encounter for preprocedural laboratory examination: Secondary | ICD-10-CM | POA: Insufficient documentation

## 2022-07-20 DIAGNOSIS — I2510 Atherosclerotic heart disease of native coronary artery without angina pectoris: Secondary | ICD-10-CM | POA: Insufficient documentation

## 2022-07-20 HISTORY — DX: Atherosclerotic heart disease of native coronary artery without angina pectoris: I25.10

## 2022-07-20 LAB — TYPE AND SCREEN
ABO/RH(D): O POS
Antibody Screen: NEGATIVE

## 2022-07-20 LAB — SURGICAL PCR SCREEN
MRSA, PCR: NEGATIVE
Staphylococcus aureus: NEGATIVE

## 2022-07-20 LAB — CBC
HCT: 37.2 % (ref 36.0–46.0)
Hemoglobin: 12.4 g/dL (ref 12.0–15.0)
MCH: 32.5 pg (ref 26.0–34.0)
MCHC: 33.3 g/dL (ref 30.0–36.0)
MCV: 97.6 fL (ref 80.0–100.0)
Platelets: 222 10*3/uL (ref 150–400)
RBC: 3.81 MIL/uL — ABNORMAL LOW (ref 3.87–5.11)
RDW: 12.7 % (ref 11.5–15.5)
WBC: 8 10*3/uL (ref 4.0–10.5)
nRBC: 0 % (ref 0.0–0.2)

## 2022-07-20 LAB — BASIC METABOLIC PANEL
Anion gap: 9 (ref 5–15)
BUN: 18 mg/dL (ref 8–23)
CO2: 30 mmol/L (ref 22–32)
Calcium: 9.5 mg/dL (ref 8.9–10.3)
Chloride: 94 mmol/L — ABNORMAL LOW (ref 98–111)
Creatinine, Ser: 1.2 mg/dL — ABNORMAL HIGH (ref 0.44–1.00)
GFR, Estimated: 47 mL/min — ABNORMAL LOW (ref >60)
Glucose, Bld: 100 mg/dL — ABNORMAL HIGH (ref 70–99)
Potassium: 4.4 mmol/L (ref 3.5–5.1)
Sodium: 133 mmol/L — ABNORMAL LOW (ref 135–145)

## 2022-07-20 NOTE — Progress Notes (Signed)
PCP - Dr. [name] Cardiologist - Dr. [name]  PPM/ICD - denies   Chest x-ray - 06/29/21 EKG - 05/06/22- records requested Stress Test - 2022 ECHO - 2022 Cardiac Cath - 12/22/20  Sleep Study - denies   DM- denies    ASA/Blood Thinner Instructions: n/a   ERAS Protcol - no, NPO   COVID TEST- n/a   Anesthesia review: yes, cardiac hx  Patient denies shortness of breath, fever, cough and chest pain at PAT appointment   All instructions explained to the patient, with a verbal understanding of the material. Patient agrees to go over the instructions while at home for a better understanding. The opportunity to ask questions was provided.

## 2022-07-26 NOTE — Anesthesia Preprocedure Evaluation (Signed)
Anesthesia Evaluation  Patient identified by MRN, date of birth, ID band Patient awake    Reviewed: Allergy & Precautions, NPO status , Patient's Chart, lab work & pertinent test results, reviewed documented beta blocker date and time   History of Anesthesia Complications Negative for: history of anesthetic complications  Airway Mallampati: II  TM Distance: >3 FB Neck ROM: Full    Dental  (+) Dental Advisory Given   Pulmonary COPD   breath sounds clear to auscultation       Cardiovascular hypertension, Pt. on medications and Pt. on home beta blockers (-) angina + CAD and + Peripheral Vascular Disease   Rhythm:Regular Rate:Normal  '22 cath: Mid LAD-2 lesion: Stent: Drug-eluting stent was successfully placed  '22 ECHO: The left ventricular size is normal.  There is normal left ventricular wall thickness.  Left ventricular systolic function is normal.  LV ejection fraction = 55-60%.  The left ventricular wall motion is normal.  There is mild to moderate tricuspid regurgitation.     Neuro/Psych  Headaches  Anxiety     Chronic back pain    GI/Hepatic Neg liver ROS,GERD  Medicated and Controlled,,  Endo/Other  Hypothyroidism  mounjaro  Renal/GU Renal InsufficiencyRenal disease     Musculoskeletal   Abdominal   Peds  Hematology negative hematology ROS (+)   Anesthesia Other Findings   Reproductive/Obstetrics                             Anesthesia Physical Anesthesia Plan  ASA: 3  Anesthesia Plan: General   Post-op Pain Management: Tylenol PO (pre-op)*   Induction: Intravenous  PONV Risk Score and Plan: 3 and Ondansetron, Dexamethasone and Treatment may vary due to age or medical condition  Airway Management Planned: Oral ETT  Additional Equipment: Arterial line  Intra-op Plan:   Post-operative Plan: Extubation in OR  Informed Consent: I have reviewed the patients History and  Physical, chart, labs and discussed the procedure including the risks, benefits and alternatives for the proposed anesthesia with the patient or authorized representative who has indicated his/her understanding and acceptance.     Dental advisory given  Plan Discussed with: CRNA and Surgeon  Anesthesia Plan Comments: (Plan routine monitors, A-line for sampling, GETA   PAT note by Courtney Caldwell, PA-C: Patient follows with cardiology at [facility] for history of CAD s/p DES to LAD 12/2020, HTN, vasovagal syncope, chronic venous insufficiency, HLD.  Last seen by Wells Guiles, NP on 05/06/2022.  Stable at that time from cardiac standpoint, 62-monthfollow-up recommended.  Cardiac clearance per telephone encounter 07/25/2022 states, "Courtney [name] is having a non-cardiac surgical procedure for Lumbar Fusion on 07/27/2022 [place]. ASA 81 mg po qd - Continue as prescribed-Any need to hold? Clinical risk factors (1 point each) Patient has: -CAD:Yes -STROKE/TIA:No -CHF (compensated or prior): No -CKD (Cr >2): No -Insulin dependent DM: No -High risk surgery (any vascular, abdominal or thoracic): No. DES to LAD 12/2020:(chest pressure and winded feeling) w/ anterior stress defects s/p synergy DES to LAD for 95% mLAD occlusion. LVEF: 50-60%-12/08/2020. RCRI score: 1-Intermediate Risk. No further cardiac testing is warranted. Patient should continue betablocker through surgery as clinically able if applicable."  Patient on GLP-1 agonist Mounjaro, she was advised to hold this 7 days prior to surgery.  Preop labs reviewed, mild hyponatremia sodium 133, mildly elevated creatinine 1.20, otherwise unremarkable.  EKG 05/06/2022 (copy  on chart): Sinus rhythm with sinus arrhythmia.  Rate 71.  Cath 12/23/2020 (Care Everywhere): Coronary Findings Diagnostic Dominance: Right  Left Anterior Descending: Prox LAD lesion is 30% stenosed.  Mid LAD-1 lesion is 50% stenosed. Mid LAD-2 lesion is 95% stenosed. Culprit lesion. The stenosis was measured by a visual reading.  Left Circumflex: There is mild diffuse disease throughout the vessel.  Right Coronary Artery: There is mild diffuse disease throughout the vessel.   Intervention  Mid LAD-2 lesion: Stent: Drug-eluting stent was successfully placed. The stent used was a SYNERGY XD MONORAIL L32 MM ID2.5 MM DELIVERY SYSTEM 1 ACCESS PORT RADIOPAQUE INFLATE LUMEN SYSTEM CORONARY STENT. Stent was deployed by way of balloon expansion. Maximum pressure: 11 atm. Inflation time: 15 sec. Supplies Used: SYNERGY XD MONORAIL L32 MM ID2.5 MM DELIVERY SYSTEM 1 ACCESS PORT RADIOPAQUE INFLATE LUMEN SYSTEM CORONARY STENT Post-Intervention Lesion Assessment: There is a 0% residual stenosis post intervention.   TTE 12/08/2020 (Care Everywhere): Image Quality Technically difficult.  Exam quality is limited due to limited echo windows.  The left ventricular size is normal.  There is normal left ventricular wall thickness.  Left ventricular systolic function is normal.  LV ejection fraction = 55-60%.  The left ventricular wall motion is normal.  There is mild to moderate tricuspid regurgitation.  Estimated right ventricular systolic pressure is 37 mmHg.    )        Anesthesia Quick Evaluation

## 2022-07-26 NOTE — Progress Notes (Signed)
Anesthesia Chart Review:  Patient follows with cardiology at [facility] for history of CAD s/p DES to LAD 12/2020, HTN, vasovagal syncope, chronic venous insufficiency, HLD.  Last seen by Courtney Guiles, NP on 05/06/2022.  Stable at that time from cardiac standpoint, 37-monthfollow-up recommended.  Cardiac clearance per telephone encounter 07/25/2022 states, "Courtney [name] is having a non-cardiac surgical procedure for Lumbar Fusion on 07/27/2022 [place]. ASA 81 mg po qd - Continue as prescribed-Any need to hold? Clinical risk factors (1 point each) Patient has: -CAD:Yes -STROKE/TIA:No -CHF (compensated or prior): No -CKD (Cr >2): No -Insulin dependent DM: No -High risk surgery (any vascular, abdominal or thoracic): No. DES to LAD 12/2020:(chest pressure and winded feeling) w/ anterior stress defects s/p synergy DES to LAD for 95% mLAD occlusion. LVEF: 50-60%-12/08/2020. RCRI score: 1-Intermediate Risk. No further cardiac testing is warranted. Patient should continue betablocker through surgery as clinically able if applicable."  Patient on GLP-1 agonist Mounjaro, she was advised to hold this 7 days prior to surgery.  Preop labs reviewed, mild hyponatremia sodium 133, mildly elevated creatinine 1.20, otherwise unremarkable.  EKG 05/06/2022 (copy on chart): Sinus rhythm with sinus arrhythmia.  Rate 71.  Cath 12/23/2020 (Care Everywhere): Coronary Findings Diagnostic Dominance: Right  Left Anterior Descending: Prox LAD lesion is 30% stenosed. Mid LAD-1 lesion is 50% stenosed. Mid LAD-2 lesion is 95% stenosed. Culprit lesion. The stenosis was measured by a visual reading.  Left Circumflex: There is mild diffuse disease throughout the vessel.  Right Coronary Artery: There is mild diffuse disease throughout the vessel.   Intervention  Mid LAD-2 lesion: Stent: Drug-eluting stent was successfully placed. The stent used  was a SYNERGY XD MONORAIL L32 MM ID2.5 MM DELIVERY SYSTEM 1 ACCESS PORT RADIOPAQUE INFLATE LUMEN SYSTEM CORONARY STENT. Stent was deployed by way of balloon expansion. Maximum pressure: 11 atm. Inflation time: 15 sec. Supplies Used: SYNERGY XD MONORAIL L32 MM ID2.5 MM DELIVERY SYSTEM 1 ACCESS PORT RADIOPAQUE INFLATE LUMEN SYSTEM CORONARY STENT Post-Intervention Lesion Assessment: There is a 0% residual stenosis post intervention.   TTE 12/08/2020 (Care Everywhere): Image Quality  Technically difficult.  Exam quality is limited due to limited echo windows.  The left ventricular size is normal.  There is normal left ventricular wall thickness.  Left ventricular systolic function is normal.  LV ejection fraction = 55-60%.  The left ventricular wall motion is normal.  There is mild to moderate tricuspid regurgitation.  Estimated right ventricular systolic pressure is 37 mmHg.      Courtney MustyMWeatherford Regional HospitalShort Stay [facility]/Anesthesiology Phone [phone]/11/2022 1:15 PM

## 2022-07-27 ENCOUNTER — Encounter (HOSPITAL_COMMUNITY): Payer: Self-pay | Admitting: Neurosurgery

## 2022-07-27 ENCOUNTER — Inpatient Hospital Stay (HOSPITAL_COMMUNITY): Payer: Medicare Other

## 2022-07-27 ENCOUNTER — Other Ambulatory Visit: Payer: Self-pay

## 2022-07-27 ENCOUNTER — Inpatient Hospital Stay (HOSPITAL_COMMUNITY): Payer: Medicare Other | Admitting: Physician Assistant

## 2022-07-27 ENCOUNTER — Inpatient Hospital Stay (HOSPITAL_COMMUNITY)
Admission: RE | Disposition: A | Discharge status: Home / Self Care | Payer: Self-pay | Source: Home / Self Care | Attending: Neurosurgery

## 2022-07-27 ENCOUNTER — Inpatient Hospital Stay (HOSPITAL_COMMUNITY)
Admission: RE | Admit: 2022-07-27 | Discharge: 2022-07-29 | DRG: 454 | Disposition: A | Discharge status: Home / Self Care | Payer: Medicare Other | Attending: Neurosurgery | Admitting: Neurosurgery

## 2022-07-27 DIAGNOSIS — I739 Peripheral vascular disease, unspecified: Secondary | ICD-10-CM | POA: Diagnosis present

## 2022-07-27 DIAGNOSIS — K219 Gastro-esophageal reflux disease without esophagitis: Secondary | ICD-10-CM | POA: Diagnosis present

## 2022-07-27 DIAGNOSIS — M5116 Intervertebral disc disorders with radiculopathy, lumbar region: Secondary | ICD-10-CM | POA: Diagnosis not present

## 2022-07-27 DIAGNOSIS — Z7985 Long-term (current) use of injectable non-insulin antidiabetic drugs: Secondary | ICD-10-CM

## 2022-07-27 DIAGNOSIS — J449 Chronic obstructive pulmonary disease, unspecified: Secondary | ICD-10-CM

## 2022-07-27 DIAGNOSIS — E785 Hyperlipidemia, unspecified: Secondary | ICD-10-CM | POA: Diagnosis present

## 2022-07-27 DIAGNOSIS — M4807 Spinal stenosis, lumbosacral region: Secondary | ICD-10-CM

## 2022-07-27 DIAGNOSIS — M48062 Spinal stenosis, lumbar region with neurogenic claudication: Secondary | ICD-10-CM | POA: Diagnosis present

## 2022-07-27 DIAGNOSIS — Z888 Allergy status to other drugs, medicaments and biological substances status: Secondary | ICD-10-CM

## 2022-07-27 DIAGNOSIS — I10 Essential (primary) hypertension: Secondary | ICD-10-CM | POA: Diagnosis present

## 2022-07-27 DIAGNOSIS — F1021 Alcohol dependence, in remission: Secondary | ICD-10-CM | POA: Diagnosis present

## 2022-07-27 DIAGNOSIS — Z8249 Family history of ischemic heart disease and other diseases of the circulatory system: Secondary | ICD-10-CM | POA: Diagnosis not present

## 2022-07-27 DIAGNOSIS — M5117 Intervertebral disc disorders with radiculopathy, lumbosacral region: Secondary | ICD-10-CM

## 2022-07-27 DIAGNOSIS — M4727 Other spondylosis with radiculopathy, lumbosacral region: Secondary | ICD-10-CM | POA: Diagnosis present

## 2022-07-27 DIAGNOSIS — Z8616 Personal history of COVID-19: Secondary | ICD-10-CM | POA: Diagnosis not present

## 2022-07-27 DIAGNOSIS — I2510 Atherosclerotic heart disease of native coronary artery without angina pectoris: Secondary | ICD-10-CM | POA: Diagnosis present

## 2022-07-27 DIAGNOSIS — E871 Hypo-osmolality and hyponatremia: Secondary | ICD-10-CM | POA: Diagnosis present

## 2022-07-27 DIAGNOSIS — Z9049 Acquired absence of other specified parts of digestive tract: Secondary | ICD-10-CM

## 2022-07-27 DIAGNOSIS — Z7952 Long term (current) use of systemic steroids: Secondary | ICD-10-CM

## 2022-07-27 DIAGNOSIS — Z90710 Acquired absence of both cervix and uterus: Secondary | ICD-10-CM

## 2022-07-27 DIAGNOSIS — M960 Pseudarthrosis after fusion or arthrodesis: Secondary | ICD-10-CM | POA: Diagnosis present

## 2022-07-27 DIAGNOSIS — E039 Hypothyroidism, unspecified: Secondary | ICD-10-CM | POA: Diagnosis present

## 2022-07-27 DIAGNOSIS — T84226A Displacement of internal fixation device of vertebrae, initial encounter: Secondary | ICD-10-CM | POA: Diagnosis present

## 2022-07-27 DIAGNOSIS — Z800 Family history of malignant neoplasm of digestive organs: Secondary | ICD-10-CM

## 2022-07-27 DIAGNOSIS — M4187 Other forms of scoliosis, lumbosacral region: Secondary | ICD-10-CM

## 2022-07-27 DIAGNOSIS — Z833 Family history of diabetes mellitus: Secondary | ICD-10-CM

## 2022-07-27 DIAGNOSIS — Y831 Surgical operation with implant of artificial internal device as the cause of abnormal reaction of the patient, or of later complication, without mention of misadventure at the time of the procedure: Secondary | ICD-10-CM | POA: Diagnosis present

## 2022-07-27 DIAGNOSIS — G8929 Other chronic pain: Secondary | ICD-10-CM | POA: Diagnosis present

## 2022-07-27 DIAGNOSIS — I872 Venous insufficiency (chronic) (peripheral): Secondary | ICD-10-CM | POA: Diagnosis present

## 2022-07-27 DIAGNOSIS — R55 Syncope and collapse: Secondary | ICD-10-CM | POA: Diagnosis present

## 2022-07-27 DIAGNOSIS — Z981 Arthrodesis status: Secondary | ICD-10-CM | POA: Diagnosis not present

## 2022-07-27 DIAGNOSIS — Z79899 Other long term (current) drug therapy: Secondary | ICD-10-CM

## 2022-07-27 DIAGNOSIS — M4150 Other secondary scoliosis, site unspecified: Principal | ICD-10-CM | POA: Diagnosis present

## 2022-07-27 DIAGNOSIS — Z87440 Personal history of urinary (tract) infections: Secondary | ICD-10-CM

## 2022-07-27 DIAGNOSIS — M4156 Other secondary scoliosis, lumbar region: Secondary | ICD-10-CM | POA: Diagnosis present

## 2022-07-27 DIAGNOSIS — Z881 Allergy status to other antibiotic agents status: Secondary | ICD-10-CM

## 2022-07-27 DIAGNOSIS — I071 Rheumatic tricuspid insufficiency: Secondary | ICD-10-CM | POA: Diagnosis present

## 2022-07-27 DIAGNOSIS — Z79890 Hormone replacement therapy (postmenopausal): Secondary | ICD-10-CM

## 2022-07-27 DIAGNOSIS — Z8701 Personal history of pneumonia (recurrent): Secondary | ICD-10-CM

## 2022-07-27 DIAGNOSIS — M4186 Other forms of scoliosis, lumbar region: Secondary | ICD-10-CM | POA: Diagnosis not present

## 2022-07-27 DIAGNOSIS — M4317 Spondylolisthesis, lumbosacral region: Secondary | ICD-10-CM | POA: Diagnosis not present

## 2022-07-27 DIAGNOSIS — Z955 Presence of coronary angioplasty implant and graft: Secondary | ICD-10-CM

## 2022-07-27 SURGERY — POSTERIOR LUMBAR FUSION 2 LEVEL
Anesthesia: General

## 2022-07-27 MED ORDER — METOPROLOL TARTRATE 50 MG PO TABS
50.0000 mg | ORAL_TABLET | Freq: Two times a day (BID) | ORAL | Status: DC
  Administered 2022-07-27 – 2022-07-29 (×4): 50 mg via ORAL
  Filled 2022-07-27 (×4): qty 1

## 2022-07-27 MED ORDER — PROPOFOL 10 MG/ML IV BOLUS
INTRAVENOUS | Status: DC | PRN
  Administered 2022-07-27: 100 mg via INTRAVENOUS

## 2022-07-27 MED ORDER — HYDROCORTISONE SOD SUC (PF) 100 MG IJ SOLR
50.0000 mg | Freq: Three times a day (TID) | INTRAMUSCULAR | Status: DC
  Administered 2022-07-27 – 2022-07-29 (×5): 50 mg via INTRAVENOUS
  Filled 2022-07-27 (×6): qty 1

## 2022-07-27 MED ORDER — FENTANYL CITRATE (PF) 250 MCG/5ML IJ SOLN
INTRAMUSCULAR | Status: DC | PRN
  Administered 2022-07-27: 50 ug via INTRAVENOUS
  Administered 2022-07-27: 200 ug via INTRAVENOUS
  Administered 2022-07-27 (×2): 50 ug via INTRAVENOUS
  Administered 2022-07-27 (×2): 25 ug via INTRAVENOUS
  Administered 2022-07-27: 50 ug via INTRAVENOUS

## 2022-07-27 MED ORDER — BACITRACIN ZINC 500 UNIT/GM EX OINT
TOPICAL_OINTMENT | CUTANEOUS | Status: AC
  Filled 2022-07-27: qty 28.35

## 2022-07-27 MED ORDER — CYCLOBENZAPRINE HCL 10 MG PO TABS
10.0000 mg | ORAL_TABLET | Freq: Three times a day (TID) | ORAL | Status: DC | PRN
  Administered 2022-07-27 – 2022-07-29 (×3): 10 mg via ORAL
  Filled 2022-07-27 (×3): qty 1

## 2022-07-27 MED ORDER — PROMETHAZINE HCL 25 MG/ML IJ SOLN: 6.2500 mg | INTRAMUSCULAR | Status: DC | PRN

## 2022-07-27 MED ORDER — HYDROMORPHONE HCL 1 MG/ML IJ SOLN
INTRAMUSCULAR | Status: AC
  Filled 2022-07-27: qty 1

## 2022-07-27 MED ORDER — HYDROMORPHONE HCL 1 MG/ML IJ SOLN
1.0000 mg | INTRAMUSCULAR | Status: DC | PRN
  Administered 2022-07-27: 1 mg via INTRAVENOUS

## 2022-07-27 MED ORDER — LACTATED RINGERS IV SOLN: INTRAVENOUS | Status: DC

## 2022-07-27 MED ORDER — SUFENTANIL CITRATE 50 MCG/ML IV SOLN
0.2000 ug/kg/h | INTRAVENOUS | Status: DC
  Filled 2022-07-27: qty 1

## 2022-07-27 MED ORDER — ONDANSETRON HCL 4 MG PO TABS: 4.0000 mg | ORAL_TABLET | Freq: Four times a day (QID) | ORAL | Status: DC | PRN

## 2022-07-27 MED ORDER — THROMBIN 5000 UNITS EX SOLR
CUTANEOUS | Status: AC
  Filled 2022-07-27: qty 5000

## 2022-07-27 MED ORDER — ACETAMINOPHEN 10 MG/ML IV SOLN
1000.0000 mg | Freq: Once | INTRAVENOUS | Status: AC
  Administered 2022-07-27: 1000 mg via INTRAVENOUS

## 2022-07-27 MED ORDER — OXYCODONE HCL 5 MG PO TABS
ORAL_TABLET | ORAL | Status: AC
  Filled 2022-07-27: qty 1

## 2022-07-27 MED ORDER — DOCUSATE SODIUM 100 MG PO CAPS
100.0000 mg | ORAL_CAPSULE | Freq: Two times a day (BID) | ORAL | Status: DC
  Administered 2022-07-27 – 2022-07-29 (×4): 100 mg via ORAL
  Filled 2022-07-27 (×4): qty 1

## 2022-07-27 MED ORDER — PANTOPRAZOLE SODIUM 40 MG PO TBEC
80.0000 mg | DELAYED_RELEASE_TABLET | Freq: Every day | ORAL | Status: DC
  Administered 2022-07-28: 80 mg via ORAL
  Filled 2022-07-27: qty 2

## 2022-07-27 MED ORDER — CHLORHEXIDINE GLUCONATE CLOTH 2 % EX PADS: 6.0000 | MEDICATED_PAD | Freq: Once | CUTANEOUS | Status: DC

## 2022-07-27 MED ORDER — BUPIVACAINE LIPOSOME 1.3 % IJ SUSP: INTRAMUSCULAR | Status: DC | PRN

## 2022-07-27 MED ORDER — VANCOMYCIN HCL IN DEXTROSE 1-5 GM/200ML-% IV SOLN
1000.0000 mg | INTRAVENOUS | Status: AC
  Administered 2022-07-27: 1000 mg via INTRAVENOUS
  Filled 2022-07-27: qty 200

## 2022-07-27 MED ORDER — PHENOL 1.4 % MT LIQD: 1.0000 | OROMUCOSAL | Status: DC | PRN

## 2022-07-27 MED ORDER — KETAMINE HCL 50 MG/5ML IJ SOSY
PREFILLED_SYRINGE | INTRAMUSCULAR | Status: AC
  Filled 2022-07-27: qty 5

## 2022-07-27 MED ORDER — SODIUM CHLORIDE 0.9% FLUSH: 3.0000 mL | INTRAVENOUS | Status: DC | PRN

## 2022-07-27 MED ORDER — THROMBIN 5000 UNITS EX SOLR: OROMUCOSAL | Status: DC | PRN

## 2022-07-27 MED ORDER — HYDROMORPHONE HCL 1 MG/ML IJ SOLN
0.2500 mg | INTRAMUSCULAR | Status: DC | PRN
  Administered 2022-07-27: 0.5 mg via INTRAVENOUS
  Administered 2022-07-27: 0.25 mg via INTRAVENOUS
  Administered 2022-07-27 (×2): 0.5 mg via INTRAVENOUS
  Administered 2022-07-27: 0.25 mg via INTRAVENOUS

## 2022-07-27 MED ORDER — LORATADINE 10 MG PO TABS
10.0000 mg | ORAL_TABLET | Freq: Every day | ORAL | Status: DC
  Filled 2022-07-27 (×2): qty 1

## 2022-07-27 MED ORDER — VANCOMYCIN HCL 750 MG/150ML IV SOLN
750.0000 mg | Freq: Once | INTRAVENOUS | Status: AC
  Administered 2022-07-27: 750 mg via INTRAVENOUS
  Filled 2022-07-27: qty 150

## 2022-07-27 MED ORDER — ONDANSETRON HCL 4 MG/2ML IJ SOLN
INTRAMUSCULAR | Status: DC | PRN
  Administered 2022-07-27: 4 mg via INTRAVENOUS

## 2022-07-27 MED ORDER — AMLODIPINE BESYLATE 5 MG PO TABS
5.0000 mg | ORAL_TABLET | Freq: Every day | ORAL | Status: DC
  Administered 2022-07-28 – 2022-07-29 (×2): 5 mg via ORAL
  Filled 2022-07-27 (×2): qty 1

## 2022-07-27 MED ORDER — DEXAMETHASONE SODIUM PHOSPHATE 10 MG/ML IJ SOLN
INTRAMUSCULAR | Status: DC | PRN
  Administered 2022-07-27: 10 mg via INTRAVENOUS

## 2022-07-27 MED ORDER — ACETAMINOPHEN 500 MG PO TABS: 1000.0000 mg | ORAL_TABLET | Freq: Once | ORAL | Status: DC

## 2022-07-27 MED ORDER — MIDAZOLAM HCL 2 MG/2ML IJ SOLN
0.5000 mg | Freq: Once | INTRAMUSCULAR | Status: AC | PRN
  Administered 2022-07-27: 1 mg via INTRAVENOUS

## 2022-07-27 MED ORDER — LIDOCAINE 2% (20 MG/ML) 5 ML SYRINGE
INTRAMUSCULAR | Status: DC | PRN
  Administered 2022-07-27: 40 mg via INTRAVENOUS

## 2022-07-27 MED ORDER — LISINOPRIL 5 MG PO TABS
5.0000 mg | ORAL_TABLET | Freq: Every day | ORAL | Status: DC
  Administered 2022-07-29: 5 mg via ORAL
  Filled 2022-07-27 (×2): qty 1

## 2022-07-27 MED ORDER — MIDAZOLAM HCL 2 MG/2ML IJ SOLN
INTRAMUSCULAR | Status: AC
  Filled 2022-07-27: qty 2

## 2022-07-27 MED ORDER — ACETAMINOPHEN 650 MG RE SUPP: 650.0000 mg | RECTAL | Status: DC | PRN

## 2022-07-27 MED ORDER — GABAPENTIN 600 MG PO TABS: 600.0000 mg | ORAL_TABLET | Freq: Every evening | ORAL | Status: DC | PRN

## 2022-07-27 MED ORDER — ROCURONIUM BROMIDE 10 MG/ML (PF) SYRINGE
PREFILLED_SYRINGE | INTRAVENOUS | Status: DC | PRN
  Administered 2022-07-27: 10 mg via INTRAVENOUS
  Administered 2022-07-27: 60 mg via INTRAVENOUS
  Administered 2022-07-27: 10 mg via INTRAVENOUS
  Administered 2022-07-27: 40 mg via INTRAVENOUS
  Administered 2022-07-27: 50 mg via INTRAVENOUS
  Administered 2022-07-27: 10 mg via INTRAVENOUS

## 2022-07-27 MED ORDER — FENTANYL CITRATE (PF) 250 MCG/5ML IJ SOLN
INTRAMUSCULAR | Status: AC
  Filled 2022-07-27: qty 5

## 2022-07-27 MED ORDER — LACTATED RINGERS IV SOLN: INTRAVENOUS | Status: DC | PRN

## 2022-07-27 MED ORDER — BUPIVACAINE-EPINEPHRINE (PF) 0.25% -1:200000 IJ SOLN
INTRAMUSCULAR | Status: AC
  Filled 2022-07-27: qty 30

## 2022-07-27 MED ORDER — OXYCODONE-ACETAMINOPHEN 10-325 MG PO TABS: 1.0000 | ORAL_TABLET | ORAL | Status: DC | PRN

## 2022-07-27 MED ORDER — MENTHOL 3 MG MT LOZG: 1.0000 | LOZENGE | OROMUCOSAL | Status: DC | PRN

## 2022-07-27 MED ORDER — ACETAMINOPHEN 325 MG PO TABS
650.0000 mg | ORAL_TABLET | ORAL | Status: DC | PRN
  Administered 2022-07-29: 650 mg via ORAL
  Filled 2022-07-27: qty 2

## 2022-07-27 MED ORDER — PREDNISONE 5 MG PO TABS: 10.0000 mg | ORAL_TABLET | Freq: Every day | ORAL | Status: DC

## 2022-07-27 MED ORDER — MOMETASONE FURO-FORMOTEROL FUM 200-5 MCG/ACT IN AERO
2.0000 | INHALATION_SPRAY | Freq: Two times a day (BID) | RESPIRATORY_TRACT | Status: DC
  Administered 2022-07-27 – 2022-07-29 (×4): 2 via RESPIRATORY_TRACT
  Filled 2022-07-27: qty 8.8

## 2022-07-27 MED ORDER — ORAL CARE MOUTH RINSE: 15.0000 mL | Freq: Once | OROMUCOSAL | Status: AC

## 2022-07-27 MED ORDER — MORPHINE SULFATE (PF) 4 MG/ML IV SOLN
4.0000 mg | INTRAVENOUS | Status: DC | PRN
  Administered 2022-07-27 – 2022-07-28 (×3): 4 mg via INTRAVENOUS
  Filled 2022-07-27 (×4): qty 1

## 2022-07-27 MED ORDER — 0.9 % SODIUM CHLORIDE (POUR BTL) OPTIME
TOPICAL | Status: DC | PRN
  Administered 2022-07-27: 1000 mL

## 2022-07-27 MED ORDER — OXYCODONE HCL 5 MG PO TABS
10.0000 mg | ORAL_TABLET | ORAL | Status: DC | PRN
  Administered 2022-07-28 – 2022-07-29 (×7): 10 mg via ORAL
  Filled 2022-07-27 (×7): qty 2

## 2022-07-27 MED ORDER — OXYCODONE HCL 5 MG PO TABS
5.0000 mg | ORAL_TABLET | Freq: Once | ORAL | Status: AC | PRN
  Administered 2022-07-27: 5 mg via ORAL

## 2022-07-27 MED ORDER — BUPIVACAINE-EPINEPHRINE (PF) 0.25% -1:200000 IJ SOLN
INTRAMUSCULAR | Status: DC | PRN
  Administered 2022-07-27: 10 mL

## 2022-07-27 MED ORDER — SODIUM CHLORIDE 0.9% FLUSH
3.0000 mL | Freq: Two times a day (BID) | INTRAVENOUS | Status: DC
  Administered 2022-07-27 – 2022-07-28 (×3): 3 mL via INTRAVENOUS

## 2022-07-27 MED ORDER — DULOXETINE HCL 30 MG PO CPEP
30.0000 mg | ORAL_CAPSULE | Freq: Every day | ORAL | Status: DC
  Administered 2022-07-28 – 2022-07-29 (×2): 30 mg via ORAL
  Filled 2022-07-27 (×2): qty 1

## 2022-07-27 MED ORDER — BISACODYL 10 MG RE SUPP: 10.0000 mg | Freq: Every day | RECTAL | Status: DC | PRN

## 2022-07-27 MED ORDER — BUPIVACAINE LIPOSOME 1.3 % IJ SUSP
INTRAMUSCULAR | Status: AC
  Filled 2022-07-27: qty 20

## 2022-07-27 MED ORDER — PROPOFOL 10 MG/ML IV BOLUS
INTRAVENOUS | Status: AC
  Filled 2022-07-27: qty 20

## 2022-07-27 MED ORDER — BACITRACIN ZINC 500 UNIT/GM EX OINT
TOPICAL_OINTMENT | CUTANEOUS | Status: DC | PRN
  Administered 2022-07-27: 1 via TOPICAL

## 2022-07-27 MED ORDER — ONDANSETRON HCL 4 MG/2ML IJ SOLN: 4.0000 mg | Freq: Four times a day (QID) | INTRAMUSCULAR | Status: DC | PRN

## 2022-07-27 MED ORDER — ACETAMINOPHEN 500 MG PO TABS
1000.0000 mg | ORAL_TABLET | Freq: Four times a day (QID) | ORAL | Status: AC
  Administered 2022-07-28 (×3): 1000 mg via ORAL
  Filled 2022-07-27 (×4): qty 2

## 2022-07-27 MED ORDER — PHENYLEPHRINE HCL-NACL 20-0.9 MG/250ML-% IV SOLN
INTRAVENOUS | Status: DC | PRN
  Administered 2022-07-27: 30 ug/min via INTRAVENOUS

## 2022-07-27 MED ORDER — SODIUM CHLORIDE 0.9 % IV SOLN
250.0000 mL | INTRAVENOUS | Status: DC
  Administered 2022-07-27: 250 mL via INTRAVENOUS

## 2022-07-27 MED ORDER — PHENYLEPHRINE 80 MCG/ML (10ML) SYRINGE FOR IV PUSH (FOR BLOOD PRESSURE SUPPORT)
PREFILLED_SYRINGE | INTRAVENOUS | Status: DC | PRN
  Administered 2022-07-27: 160 ug via INTRAVENOUS
  Administered 2022-07-27 (×3): 40 ug via INTRAVENOUS
  Administered 2022-07-27: 80 ug via INTRAVENOUS
  Administered 2022-07-27 (×3): 40 ug via INTRAVENOUS

## 2022-07-27 MED ORDER — TIZANIDINE HCL 4 MG PO TABS
4.0000 mg | ORAL_TABLET | Freq: Every day | ORAL | Status: DC
  Administered 2022-07-27 – 2022-07-28 (×2): 4 mg via ORAL
  Filled 2022-07-27 (×2): qty 1

## 2022-07-27 MED ORDER — ACETAMINOPHEN 10 MG/ML IV SOLN
INTRAVENOUS | Status: AC
  Filled 2022-07-27: qty 100

## 2022-07-27 MED ORDER — OXYCODONE HCL 5 MG/5ML PO SOLN: 5.0000 mg | Freq: Once | ORAL | Status: AC | PRN

## 2022-07-27 MED ORDER — HEMOSTATIC AGENTS (NO CHARGE) OPTIME
TOPICAL | Status: DC | PRN
  Administered 2022-07-27: 1 via TOPICAL

## 2022-07-27 MED ORDER — SUFENTANIL CITRATE 50 MCG/ML IV SOLN
0.2500 ug/kg/h | INTRAVENOUS | Status: AC
  Administered 2022-07-27: .2 ug/kg/h via INTRAVENOUS
  Filled 2022-07-27: qty 1

## 2022-07-27 MED ORDER — ZOLPIDEM TARTRATE 5 MG PO TABS
5.0000 mg | ORAL_TABLET | Freq: Every evening | ORAL | Status: DC | PRN
  Administered 2022-07-27 – 2022-07-28 (×2): 5 mg via ORAL
  Filled 2022-07-27 (×2): qty 1

## 2022-07-27 MED ORDER — OXYCODONE HCL 5 MG PO TABS: 5.0000 mg | ORAL_TABLET | ORAL | Status: DC | PRN

## 2022-07-27 MED ORDER — CHLORHEXIDINE GLUCONATE 0.12 % MT SOLN
15.0000 mL | Freq: Once | OROMUCOSAL | Status: AC
  Administered 2022-07-27: 15 mL via OROMUCOSAL
  Filled 2022-07-27: qty 15

## 2022-07-27 MED ORDER — LEVOTHYROXINE SODIUM 75 MCG PO TABS
75.0000 ug | ORAL_TABLET | Freq: Every day | ORAL | Status: DC
  Administered 2022-07-28 – 2022-07-29 (×2): 75 ug via ORAL
  Filled 2022-07-27 (×2): qty 1

## 2022-07-27 MED ORDER — NITROGLYCERIN 0.4 MG SL SUBL: 0.4000 mg | SUBLINGUAL_TABLET | SUBLINGUAL | Status: DC | PRN

## 2022-07-27 SURGICAL SUPPLY — 73 items
APL SKNCLS STERI-STRIP NONHPOA (GAUZE/BANDAGES/DRESSINGS) ×1
BAG COUNTER SPONGE SURGICOUNT (BAG) ×2 IMPLANT
BAG SPNG CNTER NS LX DISP (BAG) ×1
BASKET BONE COLLECTION (BASKET) ×2 IMPLANT
BENZOIN TINCTURE PRP APPL 2/3 (GAUZE/BANDAGES/DRESSINGS) ×2 IMPLANT
BLADE CLIPPER SURG (BLADE) IMPLANT
BOWL CEMENT MIX W SPATULA BONE (MISCELLANEOUS) IMPLANT
BUR 14 MATCH 3 (BUR) IMPLANT
BUR MATCHSTICK NEURO 3.0 LAGG (BURR) ×2 IMPLANT
BUR PRECISION FLUTE 6.0 (BURR) ×2 IMPLANT
BURR 14 MATCH 3 (BUR) ×1
CAGE ALTERA 10X31X9-13 15D (Cage) IMPLANT
CAGE ALTERA 8X12-8 (Cage) IMPLANT
CANISTER SUCT 3000ML PPV (MISCELLANEOUS) ×2 IMPLANT
CAP LOCK DLX THRD (Cap) IMPLANT
CAP REVERE LOCKING (Cap) IMPLANT
CNTNR URN SCR LID CUP LEK RST (MISCELLANEOUS) ×2 IMPLANT
CONT SPEC 4OZ STRL OR WHT (MISCELLANEOUS) ×1
COVER BACK TABLE 60X90IN (DRAPES) ×2 IMPLANT
COVERAGE SUPPORT O-ARM STEALTH (MISCELLANEOUS) ×1 IMPLANT
DRAPE C-ARM 42X72 X-RAY (DRAPES) ×4 IMPLANT
DRAPE HALF SHEET 40X57 (DRAPES) ×2 IMPLANT
DRAPE LAPAROTOMY 100X72X124 (DRAPES) ×2 IMPLANT
DRAPE SHEET LG 3/4 BI-LAMINATE (DRAPES) ×8 IMPLANT
DRAPE SURG 17X23 STRL (DRAPES) ×8 IMPLANT
DRSG OPSITE POSTOP 4X10 (GAUZE/BANDAGES/DRESSINGS) IMPLANT
DRSG OPSITE POSTOP 4X6 (GAUZE/BANDAGES/DRESSINGS) ×2 IMPLANT
ELECT BLADE 4.0 EZ CLEAN MEGAD (MISCELLANEOUS) ×1
ELECT REM PT RETURN 9FT ADLT (ELECTROSURGICAL) ×1
ELECTRODE BLDE 4.0 EZ CLN MEGD (MISCELLANEOUS) ×2 IMPLANT
ELECTRODE REM PT RTRN 9FT ADLT (ELECTROSURGICAL) ×2 IMPLANT
FEE COVERAGE SUPPORT O-ARM (MISCELLANEOUS) ×2 IMPLANT
GLOVE BIO SURGEON STRL SZ 6 (GLOVE) ×2 IMPLANT
GLOVE BIO SURGEON STRL SZ8 (GLOVE) ×4 IMPLANT
GLOVE BIO SURGEON STRL SZ8.5 (GLOVE) ×4 IMPLANT
GLOVE BIOGEL PI IND STRL 6.5 (GLOVE) ×2 IMPLANT
GLOVE EXAM NITRILE XL STR (GLOVE) IMPLANT
GOWN STRL REUS W/ TWL LRG LVL3 (GOWN DISPOSABLE) ×2 IMPLANT
GOWN STRL REUS W/ TWL XL LVL3 (GOWN DISPOSABLE) ×4 IMPLANT
GOWN STRL REUS W/TWL 2XL LVL3 (GOWN DISPOSABLE) IMPLANT
GOWN STRL REUS W/TWL LRG LVL3 (GOWN DISPOSABLE) ×1
GOWN STRL REUS W/TWL XL LVL3 (GOWN DISPOSABLE) ×2
GRAFT TRINITY ELITE LGE HUMAN (Tissue) IMPLANT
HEMOSTAT POWDER KIT SURGIFOAM (HEMOSTASIS) ×2 IMPLANT
KIT BASIN OR (CUSTOM PROCEDURE TRAY) ×2 IMPLANT
KIT GRAFTMAG DEL NEURO DISP (NEUROSURGERY SUPPLIES) IMPLANT
KIT TURNOVER KIT B (KITS) ×2 IMPLANT
MARKER SPHERE PSV REFLC NDI (MISCELLANEOUS) ×10 IMPLANT
NDL HYPO 21X1.5 SAFETY (NEEDLE) IMPLANT
NEEDLE HYPO 21X1.5 SAFETY (NEEDLE) IMPLANT
NEEDLE HYPO 22GX1.5 SAFETY (NEEDLE) ×2 IMPLANT
NS IRRIG 1000ML POUR BTL (IV SOLUTION) ×2 IMPLANT
PACK LAMINECTOMY NEURO (CUSTOM PROCEDURE TRAY) ×2 IMPLANT
PAD ARMBOARD 7.5X6 YLW CONV (MISCELLANEOUS) ×6 IMPLANT
PATTIES SURGICAL .5 X1 (DISPOSABLE) IMPLANT
PATTIES SURGICAL 1X1 (DISPOSABLE) IMPLANT
PUTTY DBM 10CC CALC GRAN (Putty) IMPLANT
ROD CURVED TI 6.35X150 (Rod) IMPLANT
SCREW 7.5X45MM (Screw) IMPLANT
SCREW 7.5X50MM (Screw) IMPLANT
SCREW PA DLX CREO 8.5X75 (Screw) IMPLANT
SPIKE FLUID TRANSFER (MISCELLANEOUS) ×2 IMPLANT
SPONGE NEURO XRAY DETECT 1X3 (DISPOSABLE) IMPLANT
SPONGE SURGIFOAM ABS GEL 100 (HEMOSTASIS) IMPLANT
SPONGE T-LAP 4X18 ~~LOC~~+RFID (SPONGE) IMPLANT
STRIP CLOSURE SKIN 1/2X4 (GAUZE/BANDAGES/DRESSINGS) ×2 IMPLANT
SUT VIC AB 1 CT1 18XBRD ANBCTR (SUTURE) ×4 IMPLANT
SUT VIC AB 1 CT1 8-18 (SUTURE) ×2
SUT VIC AB 2-0 CP2 18 (SUTURE) ×4 IMPLANT
SYR 20ML LL LF (SYRINGE) IMPLANT
TOWEL GREEN STERILE (TOWEL DISPOSABLE) ×2 IMPLANT
TOWEL GREEN STERILE FF (TOWEL DISPOSABLE) ×2 IMPLANT
WATER STERILE IRR 1000ML POUR (IV SOLUTION) ×2 IMPLANT

## 2022-07-27 NOTE — Progress Notes (Signed)
Orthopedic Tech Progress Note Patient Details:  Courtney [name] 04-10-1946 [id number]  Ortho Devices Type of Ortho Device: Lumbar corsett Ortho Device/Splint Interventions: Ordered   Post Interventions Instructions Provided: Adjustment of device, Care of device LSO dropped off at bedside. Vernona Rieger 07/27/2022, 10:25 PM

## 2022-07-27 NOTE — Anesthesia Postprocedure Evaluation (Signed)
Anesthesia Post Note  Patient: Courtney [name]  Procedure(s) Performed: POSTERIOR LUMBAR INTERBODY FUSION, LUMBAR TWO-LUMBAR THREE, LUMBAR FIVE-SACRAL ONE; LUMBAR TWO TO THE ILIUM INSTRUMENTATION Application of O-Arm     Patient location during evaluation: PACU Anesthesia Type: General Level of consciousness: awake and alert Pain management: pain level controlled Vital Signs Assessment: post-procedure vital signs reviewed and stable Respiratory status: spontaneous breathing, nonlabored ventilation, respiratory function stable and patient connected to nasal cannula oxygen Cardiovascular status: blood pressure returned to baseline and stable Postop Assessment: no apparent nausea or vomiting Anesthetic complications: no  No notable events documented.  Last Vitals:  Vitals:   07/27/22 2000 07/27/22 2015  BP: (!) 148/75 135/74  Pulse: 80 81  Resp: 16 13  Temp:    SpO2: 99% 100%    Last Pain:  Vitals:   07/27/22 2015  TempSrc:   PainSc: 6                  Jahnavi Muratore,W. EDMOND

## 2022-07-27 NOTE — H&P (Signed)
Subjective: The patient is a 77 year old white female performed an L3-4 and L4-5 decompression and instrumentation she initially did well but has developed recurrent back and right greater than left leg pain consistent with lumbar radiculopathy/neurogenic claudication.  She failed medical management and was worked up with lumbar x-rays and a lumbar MRI scan.  This demonstrated degenerative scoliosis, lumbar canal stenosis, expected.  I discussed the various treatment options with her.  She has decided proceed with surgery.  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 40/34/7425   Followed in Pulmonary clinic/ [facility]/ [place]    Cardiac dysrhythmia    Chronic idiopathic constipation    Colitis, acute 02/08/2021   Coronary artery disease    Depression, major, recurrent, moderate (HCC)    Headache    Heart murmur    History of bladder infections    History of COVID-19    Hyperlipidemia    Hypertension    Hypothyroidism    Knee pain, bilateral    Melena 02/08/2021   Osteoarthritis    Pneumonia    Primary insomnia    Recovering alcoholic (HCC)    SVT (supraventricular tachycardia)    UTI (urinary tract infection)    Varicose veins    Vitamin B12 deficiency     Past Surgical History:  Procedure Laterality Date   ABDOMINAL HYSTERECTOMY  1991   APPENDECTOMY     BACK SURGERY     had 2 surgeries. Have rods placed in back    BREAST BIOPSY Left    x2   BREAST ENHANCEMENT SURGERY  2006   CARDIAC CATHETERIZATION  12/22/2020   stents placed   CHOLECYSTECTOMY  12/2019   removed lap band.    COLONOSCOPY  12/01/2016   Moderate predominantly sigmod diverticulosis. Otherwise grossly normal colonoscopy   EYE SURGERY     IOL- bilateral - [place]   KNEE ARTHROSCOPY Left    lap band surgery  1981   LUMBAR FUSION  07/29/2015   posterior level one   removal of cervical disc  fragments  10/2007   pt. denies    TUBAL LIGATION  1970    Allergies  Allergen Reactions   Levofloxacin Hives, Shortness Of Breath, Swelling and Other (See Comments)    RASH IN MOUTH   (can take IV route)   Augmentin [Amoxicillin-Pot Clavulanate] Nausea Only   Bactrim [Sulfamethoxazole-Trimethoprim] Hives and Itching   Clarithromycin     GI upset Other reaction(s): GI Upset (intolerance) GI upset -can take but does cause mild upset    Lyrica [Pregabalin]    Nexletol [Bempedoic Acid]     Muscle pain   Repatha [Evolocumab]     Legs hurt.   Pt is currently taking repatha   Statins Other (See Comments)    Myalgias   Zetia [Ezetimibe]     Muscle pain    Social History   Tobacco Use   Smoking status: Never   Smokeless tobacco: Never  Substance Use Topics   Alcohol use: No    Alcohol/week: 0.0 standard drinks of alcohol    Comment: recovery x 20 yrs    Family History  Problem Relation Age of Onset   Heart disease Mother    Heart disease Father    Diabetes Sister    Other Sister        BRAIN TUMOR   Heart disease Brother  Heart disease Brother    Heart disease Brother    Heart disease Brother    Heart disease Brother    Colon cancer Maternal Aunt    Colon cancer Cousin    Prior to Admission medications   Medication Sig Start Date End Date Taking? Authorizing Provider  amLODipine (NORVASC) 5 MG tablet Take 5 mg by mouth daily.   Yes [provider]  cetirizine (ZYRTEC) 10 MG tablet Take 1 tablet (10 mg total) by mouth daily. Patient taking differently: Take 10 mg by mouth daily as needed for allergies. 04/13/21  Yes [name], Kirsten, MD  D-Mannose 350 MG CAPS Take 350 mg by mouth daily.   Yes [provider]  docusate sodium (COLACE) 100 MG capsule Take 300-400 mg by mouth at bedtime.   Yes [provider]  furosemide (LASIX) 20 MG tablet TAKE 1 TABLET(20 MG) BY MOUTH DAILY Patient taking differently: Take 20 mg by mouth daily as needed for  edema. 01/03/22  Yes Rip Harbour, NP  levothyroxine (SYNTHROID) 75 MCG tablet TAKE 1 TABLET(75 MCG) BY MOUTH DAILY 06/11/22  Yes [name], Kirsten, MD  lisinopril (ZESTRIL) 5 MG tablet Take 5 mg by mouth daily. 05/02/22  Yes [provider]  meloxicam (MOBIC) 15 MG tablet Take 15 mg by mouth daily. 04/28/22  Yes [provider]  metoprolol tartrate (LOPRESSOR) 50 MG tablet TAKE 1 TABLET(50 MG) BY MOUTH TWICE DAILY WITH MEALS 03/16/22  Yes [name], Kirsten, MD  Multiple Vitamins-Minerals (MULTIVITAMIN WITH MINERALS) tablet Take 1 tablet by mouth daily.   Yes [provider]  omeprazole (PRILOSEC) 40 MG capsule Take 1 capsule (40 mg total) by mouth daily. Patient taking differently: Take 40 mg by mouth daily as needed (acid reflux). 04/25/22  Yes [name], Kirsten, MD  oxyCODONE-acetaminophen (PERCOCET) 10-325 MG tablet Take 1 tablet by mouth every 4 (four) hours as needed for pain (max of 5 per day). 07/16/21  Yes [provider]  REPATHA SURECLICK 413 MG/ML SOAJ Inject 140 mg into the skin every 14 (fourteen) days.   Yes [provider]  tirzepatide Darcel Bayley) 10 MG/0.5ML Pen Inject 10 mg into the skin once a week. 07/06/22  Yes [name], Kirsten, MD  tiZANidine (ZANAFLEX) 4 MG tablet Take 4 mg by mouth at bedtime. 05/24/22  Yes [provider]  budesonide-formoterol (SYMBICORT) 160-4.5 MCG/ACT inhaler Inhale 2 puffs into the lungs in the morning and at bedtime. Patient not taking: Reported on 07/18/2022 02/25/21   [name], MD  DULoxetine (CYMBALTA) 30 MG capsule Take 1 capsule (30 mg total) by mouth daily for 3 days, THEN 2 capsules (60 mg total) daily for 27 days. Patient not taking: Reported on 07/18/2022 07/06/22 08/05/22  CoxElnita Maxwell, MD  gabapentin (NEURONTIN) 600 MG tablet Take 1 tablet (600 mg total) by mouth at bedtime. As needed Patient not taking: Reported on 07/18/2022 04/29/22   CoxElnita Maxwell, MD  ipratropium (ATROVENT) 0.02 % nebulizer solution Take  2.5 mLs (0.5 mg total) by nebulization 4 (four) times daily. Patient taking differently: Take 0.5 mg by nebulization every 6 (six) hours as needed for wheezing or shortness of breath. 06/28/21   [name], Elnita Maxwell, MD  nitroGLYCERIN (NITROSTAT) 0.4 MG SL tablet PLACE 1 TABLET UNDER THE TONGUE EVERY 5 MINUTES AS NEEDED FOR CHEST PAIN. 04/25/22   [name], Elnita Maxwell, MD  phenazopyridine (PYRIDIUM) 100 MG tablet Take 1 tablet (100 mg total) by mouth 3 (three) times daily as needed for pain. 07/13/22   Neil Crouch, FNP  predniSONE (DELTASONE) 10 MG tablet Take 1 tablet (10 mg total) by mouth daily with breakfast. 07/07/22   [name], Elnita Maxwell, MD     Review of Systems  Positive ROS: As above  All other systems have been reviewed and were otherwise negative with the exception of those mentioned in the HPI and as above.  Objective: Vital signs in last 24 hours: Temp:  [97.7 F (36.5 C)] 97.7 F (36.5 C) (02/07 0935) Pulse Rate:  [70] 70 (02/07 0935) Resp:  [18] 18 (02/07 0935) BP: (160)/(87) 160/87 (02/07 0935) SpO2:  [98 %] 98 % (02/07 0935) Weight:  [63.5 kg] 63.5 kg (02/07 0935) Estimated body mass index is 23.3 kg/m as calculated from the following:   Height as of this encounter: '5\' 5"'$  (1.651 m).   Weight as of this encounter: 63.5 kg.   General Appearance: Alert Head: Normocephalic, without obvious abnormality, atraumatic Eyes: PERRL, conjunctiva/corneas clear, EOM's intact,    Ears: Normal  Throat: Normal  Neck: Supple, Back: Her lumbar incision is well-healed. Lungs: Clear to auscultation bilaterally, respirations unlabored Heart: Regular rate and rhythm, no murmur, rub or gallop Abdomen: Soft, non-tender Extremities: Extremities normal, atraumatic, no cyanosis or edema Skin: unremarkable  NEUROLOGIC:   Mental status: alert and oriented,Motor Exam - grossly normal Sensory Exam - grossly normal Reflexes:  Coordination - grossly normal Gait - grossly normal Balance - grossly normal Cranial  Nerves: I: smell Not tested  II: visual acuity  OS: Normal  OD: Normal   II: visual fields Full to confrontation  II: pupils Equal, round, reactive to light  III,VII: ptosis None  III,IV,VI: extraocular muscles  Full ROM  V: mastication Normal  V: facial light touch sensation  Normal  V,VII: corneal reflex  Present  VII: facial muscle function - upper  Normal  VII: facial muscle function - lower Normal  VIII: hearing Not tested  IX: soft palate elevation  Normal  IX,X: gag reflex Present  XI: trapezius strength  5/5  XI: sternocleidomastoid strength 5/5  XI: neck flexion strength  5/5  XII: tongue strength  Normal    Data Review Lab Results  Component Value Date   WBC 8.0 07/20/2022   HGB 12.4 07/20/2022   HCT 37.2 07/20/2022   MCV 97.6 07/20/2022   PLT 222 07/20/2022   Lab Results  Component Value Date   NA 133 (L) 07/20/2022   K 4.4 07/20/2022   CL 94 (L) 07/20/2022   CO2 30 07/20/2022   BUN 18 07/20/2022   CREATININE 1.20 (H) 07/20/2022   GLUCOSE 100 (H) 07/20/2022   No results found for: "INR", "PROTIME"  Assessment/Plan: Adult degenerative scoliosis, lumbar and lumbosacral degenerative disease, lumbar lumbosacral spinal stenosis, lumbago, lumbar radiculopathy, lumbosacral radiculopathy, neurogenic claudication: I have discussed the situation with the patient.  I reviewed her imaging studies with her and pointed out the abnormalities.  We have discussed the various treatment options including surgery.  I have described the surgical treatment option of an L2-3 and L5-S1 decompression instrumentation fusion to be healing.  I have shown her surgical models.  I have given her surgical pamphlet.  We have discussed the risk, benefits, alternatives, expected postoperative course, and likelihood of achieving her goals with surgery.  I have answered all her questions.  She has decided proceed with surgery.   Ophelia Charter 07/27/2022 11:12 AM

## 2022-07-27 NOTE — Op Note (Addendum)
Brief history: The patient is a 77 year old white female whom I previously performed an L3-4 and L4-5 decompression, instrumentation and fusion.  She initially did well but has developed recurrent back and right greater left leg pain consistent with lumbar radiculopathy/neurogenic claudication.  She failed medical management and was worked up with lumbar x-rays and lumbar MRI which demonstrated multilevel degenerative disease, adult degenerative scoliosis, spinal stenosis, etc.  I discussed the various treatment options with her.  She has decided proceed with surgery.  Preoperative diagnosis: Adult degenerative scoliosis, lumbar degenerative disc disease, spinal stenosis compressing the L2, L3, L5 and S1 nerve roots; lumbago; lumbar radiculopathy; neurogenic claudication  Postoperative diagnosis: The same  Procedure: Bilateral L2-3 and L5-S1 laminotomy/foraminotomies/medial facetectomy to decompress the bilateral L2, L3, L5 and S1 nerve roots(the work required to do this was in addition to the work required to do the posterior lumbar interbody fusion because of the patient's spinal stenosis, facet arthropathy. Etc. requiring a wide decompression of the nerve roots.);  L2-3 and L5-S1 transforaminal lumbar interbody fusion with local morselized autograft bone and Zimmer DBM; insertion of interbody prosthesis at L2-3 and L5-S1 (globus peek expandable interbody prosthesis); posterior segmental instrumentation from L2 to ilium with globus titanium pedicle screws and rods; posterior lateral arthrodesis at L2-3 and L5-S1 and redo posterolateral arthrodesis at L3-4 and L4-5 with local morselized autograft bone, Zimmer DBM, and Trinity bone graft extender; insertion of bilateral S2 AI ilium screws; exploration of lumbar fusion/removal of lumbar hardware; application of spinal navigation (O-arm).  Surgeon: Dr. Earle Gell  Asst.: Arnetha Massy, NP  Anesthesia: Gen. endotracheal  Estimated blood loss: 300  cc  Drains: None  Complications: None  Description of procedure: The patient was brought to the operating room by the anesthesia team. General endotracheal anesthesia was induced. The patient was turned to the prone position on the Wilson frame. The patient's lumbosacral region was then prepared with Betadine scrub and Betadine solution. Sterile drapes were applied.  I then injected the area to be incised with Marcaine with epinephrine solution. I then used the scalpel to make a linear midline incision over the L2-3 to L5-S1 interspace. I then used electrocautery to perform a bilateral subperiosteal dissection exposing the spinous process and lamina of L2 to the sacrum, and exposing the old hardware at L3-4 and L4-5.  We explored the fusion by removing the caps from the old screws and then removed the rods.  I inspected the arthrodesis at L3-4 and L4-5 bilaterally.  I could not identify a clear arthrodesis likely indicative of a pseudoarthrosis.  We then inserted the Verstrac retractor to provide exposure.  I began the decompression by using the high speed drill to perform laminotomies at L2-3 and L5-S1 bilaterally. We then used the Kerrison punches to widen the laminotomy and removed the ligamentum flavum at L2-3 and L5-S1 bilaterally. We used the Kerrison punches to remove the medial facets at L2-3 and L5-S1 bilaterally. We performed wide foraminotomies about the bilateral L2, L3, L5 and S1 nerve roots completing the decompression.  We now turned our attention to the posterior lumbar interbody fusion. I used a scalpel to incise the intervertebral disc at L2-3 and L5-S1 bilaterally. I then performed a partial intervertebral discectomy at L2-3 and L5-S1 bilaterally using the pituitary forceps. We prepared the vertebral endplates at E2-6 and S3-M1 bilaterally for the fusion by removing the soft tissues with the curettes. We then used the trial spacers to pick the appropriate sized interbody prosthesis.  We prefilled his prosthesis  with a combination of local morselized autograft bone that we obtained during the decompression as well as Zimmer DBM. We inserted the prefilled prosthesis into the interspace at L2-3 and L5-S1, we then turned and expanded the prosthesis. There was a good snug fit of the prosthesis in the interspace. We then filled and the remainder of the intervertebral disc space with local morselized autograft bone and Zimmer DBM. This completed the posterior lumbar interbody arthrodesis.  During the decompression and insertion of the prosthesis the assistant protected the thecal sac and nerve roots with the D'Errico retractor.  We now turned attention to the instrumentation. Under O-arm spinal navigation guidance we cannulated the bilateral L2 and S1 pedicles with the bone probe. We then removed the bone probe. We then tapped the pedicle with a 6.5 millimeter tap. We then removed the tap. We probed inside the tapped pedicle with a ball probe to rule out cortical breaches. We then inserted a 7.5 x 50 and 45 millimeter pedicle screw into the L2 and S1 pedicles bilaterally under fluoroscopic guidance. We then palpated along the medial aspect of the pedicles to rule out cortical breaches. There were none. The nerve roots were not injured.  We also used the O-arm guidance to place bilateral S2 AI screws.  We cannulated the ilium with the bone probe, removed the bone probe and tapped the ileum.  We probe inside the temples there were no cortical breaches.  We then inserted a 8.5 x 75 mm S2 AI screw bilaterally.   The old left L5 pedicle screw was out of alignment with the rest of the screws and could not be connected to the rod.  I therefore removed that screw. We then connected the unilateral pedicle screws with a lordotic rod. We compressed the construct at L2-3 and secured the rod in place with the caps. We then tightened the caps appropriately. This completed the instrumentation from L2 to the ilium  bilaterally.  We now turned our attention to the posterior lateral arthrodesis at L2-3 and L5-S1 as well as the redo posterolateral to basis at L3-4 and L4-5.  We cleared the soft tissue over the bilateral L3-4 and L4-5 pars, facets, transverse process, etc.  We used the high-speed drill to decorticate the remainder of the facets, pars, transverse process at L2-3, L3-4, L4-5 and L5-S1. We then applied a combination of local morselized autograft bone, Zimmer DBM and Trinity bone graft extender over these decorticated posterior lateral structures. This completed the posterior lateral arthrodesis at L2-3, L3-4, L4-5 and L5-S1.  We then obtained hemostasis using bipolar electrocautery. We irrigated the wound out with saline solution. We inspected the thecal sac and nerve roots and noted they were well decompressed. We then removed the retractor.  We injected Exparel . We reapproximated patient's thoracolumbar fascia with interrupted #1 Vicryl suture. We reapproximated patient's subcutaneous tissue with interrupted 2-0 Vicryl suture. The reapproximated patient's skin with Steri-Strips and benzoin. The wound was then coated with bacitracin ointment. A sterile dressing was applied. The drapes were removed. The patient was subsequently returned to the supine position where they were extubated by the anesthesia team. He was then transported to the post anesthesia care unit in stable condition. All sponge instrument and needle counts were reportedly correct at the end of this case.

## 2022-07-27 NOTE — Transfer of Care (Signed)
Immediate Anesthesia Transfer of Care Note  Patient: Courtney [name]  Procedure(s) Performed: POSTERIOR LUMBAR INTERBODY FUSION, LUMBAR TWO-LUMBAR THREE, LUMBAR FIVE-SACRAL ONE; LUMBAR TWO TO THE ILIUM INSTRUMENTATION Application of O-Arm  Patient Location: PACU  Anesthesia Type:General  Level of Consciousness: drowsy and patient cooperative  Airway & Oxygen Therapy: Patient Spontanous Breathing  Post-op Assessment: Report given to RN and Post -op Vital signs reviewed and stable  Post vital signs: Reviewed and stable  Last Vitals:  Vitals Value Taken Time  BP 149/88 07/27/22 1915  Temp 36.3 C 07/27/22 1915  Pulse 89 07/27/22 1922  Resp 15 07/27/22 1922  SpO2 97 % 07/27/22 1922  Vitals shown include unvalidated device data.  Last Pain:  Vitals:   07/27/22 1915  TempSrc:   PainSc: 8          Complications: No notable events documented.

## 2022-07-27 NOTE — Anesthesia Procedure Notes (Signed)
Arterial Line Insertion Start/End2/12/2022 12:05 PM Performed by: Wilburn Cornelia, CRNA, CRNA  Patient location: OR. Preanesthetic checklist: patient identified, IV checked, site marked, risks and benefits discussed, surgical consent, monitors and equipment checked, pre-op evaluation, timeout performed and anesthesia consent Patient sedated Left, radial was placed Catheter size: 20 G Hand hygiene performed  and maximum sterile barriers used   Attempts: 2 Procedure performed without using ultrasound guided technique. Following insertion, dressing applied. Post procedure assessment: normal  Patient tolerated the procedure well with no immediate complications.

## 2022-07-27 NOTE — Progress Notes (Signed)
Pharmacy Antibiotic Note  Courtney [name] is a 77 y.o. female admitted on 07/27/2022 with spinal surgery.  Pharmacy has been consulted for vancomycin dosing for surgical ppx. Pt does not have drain so will only give one dose, preop dose given ~1045.  Plan: Vancomycin '750mg'$  IV x1 at 2300 Pharmacy will sign off, reconsult if needed   Height: '5\' 5"'$  (165.1 cm) Weight: 63.5 kg (140 lb) IBW/kg (Calculated) : 57  Temp (24hrs), Avg:97.5 F (36.4 C), Min:97.4 F (36.3 C), Max:97.7 F (36.5 C)  No results for input(s): "WBC", "CREATININE", "LATICACIDVEN", "VANCOTROUGH", "VANCOPEAK", "VANCORANDOM", "GENTTROUGH", "GENTPEAK", "GENTRANDOM", "TOBRATROUGH", "TOBRAPEAK", "TOBRARND", "AMIKACINPEAK", "AMIKACINTROU", "AMIKACIN" in the last 168 hours.  Estimated Creatinine Clearance: 35.9 mL/min (A) (by C-G formula based on SCr of 1.2 mg/dL (H)).    Allergies  Allergen Reactions   Levofloxacin Hives, Shortness Of Breath, Swelling and Other (See Comments)    RASH IN MOUTH   (can take IV route)   Augmentin [Amoxicillin-Pot Clavulanate] Nausea Only   Bactrim [Sulfamethoxazole-Trimethoprim] Hives and Itching   Clarithromycin     GI upset Other reaction(s): GI Upset (intolerance) GI upset -can take but does cause mild upset    Lyrica [Pregabalin]    Nexletol [Bempedoic Acid]     Muscle pain   Repatha [Evolocumab]     Legs hurt.   Pt is currently taking repatha   Statins Other (See Comments)    Myalgias   Zetia [Ezetimibe]     Muscle pain    Arrie Senate, PharmD, BCPS, [facility] Clinical Pharmacist [phone] Please check AMION for all [facility] numbers 07/27/2022

## 2022-07-27 NOTE — Anesthesia Procedure Notes (Signed)
Procedure Name: Intubation Date/Time: 07/27/2022 12:01 PM  Performed by: Trinna Post., CRNAPre-anesthesia Checklist: Patient identified, Emergency Drugs available, Suction available, Patient being monitored and Timeout performed Patient Re-evaluated:Patient Re-evaluated prior to induction Oxygen Delivery Method: Circle system utilized Preoxygenation: Pre-oxygenation with 100% oxygen Induction Type: IV induction Ventilation: Mask ventilation without difficulty Laryngoscope Size: Mac and 3 Grade View: Grade I Tube type: Oral Tube size: 7.5 mm Number of attempts: 1 Airway Equipment and Method: Stylet Placement Confirmation: ETT inserted through vocal cords under direct vision, positive ETCO2 and breath sounds checked- equal and bilateral Secured at: 23 cm Tube secured with: Tape Dental Injury: Teeth and Oropharynx as per pre-operative assessment

## 2022-07-28 MED FILL — Thrombin For Soln 5000 Unit: CUTANEOUS | Qty: 5000 | Status: AC

## 2022-07-28 NOTE — Progress Notes (Signed)
Subjective: The patient is alert and pleasant.  She looks well.  Her daughter is at the bedside.  Objective: Vital signs in last 24 hours: Temp:  [97.4 F (36.3 C)-98.6 F (37 C)] 98.6 F (37 C) (02/08 0747) Pulse Rate:  [79-95] 82 (02/08 0747) Resp:  [12-17] 15 (02/08 0747) BP: (99-149)/(57-88) 116/62 (02/08 0747) SpO2:  [91 %-100 %] 96 % (02/08 0747) Arterial Line BP: (160-185)/(66-76) 173/68 (02/07 1945) Estimated body mass index is 23.3 kg/m as calculated from the following:   Height as of this encounter: '5\' 5"'$  (1.651 m).   Weight as of this encounter: 63.5 kg.   Intake/Output from previous day: 02/07 0701 - 02/08 0700 In: 3011 [P.O.:100; I.V.:2911] Out: 1360 [Urine:1060; Blood:300] Intake/Output this shift: No intake/output data recorded.  Physical exam the patient is alert and oriented.  Her strength is normal in the lower extremities.  She looks well.  Lab Results: No results for input(s): "WBC", "HGB", "HCT", "PLT" in the last 72 hours. BMET No results for input(s): "NA", "K", "CL", "CO2", "GLUCOSE", "BUN", "CREATININE", "CALCIUM" in the last 72 hours.  Studies/Results: DG O-ARM IMAGE ONLY/NO REPORT  Result Date: 07/27/2022 There is no Radiologist interpretation  for this exam.   Assessment/Plan: Postop day 1: The patient is doing well.  We will mobilize her with PT.  She may go home later on today.  I gave her discharge instructions and answered all other questions.  LOS: 1 day     Ophelia Charter 07/28/2022, 9:37 AM     Patient ID: Courtney [name], female   DOB: [birth year], 77 y.o.   MRN: [id number]

## 2022-07-28 NOTE — Evaluation (Signed)
Physical Therapy Evaluation Patient Details Name: Courtney [name] MRN: [id number] DOB: [birth year] Today's Date: 07/28/2022  History of Present Illness  Pt is a 77 y.o. female s/p PLIF 07/26/22. PMH: HTN, SVT, depression, OA, CAD, h/o previous back sx  Clinical Impression  Patient is s/p above surgery resulting in the deficits listed below (see PT Problem List). PTA pt lived at home with husband, independent and driving. On eval, pt required min assist bed mobility, min guard assist transfers, and min guard assist amb 100' with RW.  Patient will benefit from skilled PT to increase their independence and safety with mobility (while adhering to their precautions) to allow discharge to the venue listed below.        Recommendations for follow up therapy are one component of a multi-disciplinary discharge planning process, led by the attending physician.  Recommendations may be updated based on patient status, additional functional criteria and insurance authorization.  Follow Up Recommendations No PT follow up      Assistance Recommended at Discharge PRN  Patient can return home with the following  A little help with bathing/dressing/bathroom;Assistance with cooking/housework;Assist for transportation    Equipment Recommendations None recommended by PT  Recommendations for Other Services       Functional Status Assessment Patient has had a recent decline in their functional status and demonstrates the ability to make significant improvements in function in a reasonable and predictable amount of time.     Precautions / Restrictions Precautions Precautions: Back Precaution Comments: Educated on 3/3 back precautions. Handout provided. Required Braces or Orthoses: Spinal Brace Spinal Brace: Lumbar corset;Applied in sitting position      Mobility  Bed Mobility Overal bed mobility: Needs Assistance Bed Mobility: Rolling, Sidelying to Sit Rolling: Modified independent (Device/Increase  time) Sidelying to sit: Min assist       General bed mobility comments: +rail, cues for sequencing, assist to elevate trunk    Transfers Overall transfer level: Needs assistance Equipment used: Rolling walker (2 wheels) Transfers: Sit to/from Stand Sit to Stand: Min guard                Ambulation/Gait Ambulation/Gait assistance: Min guard Gait Distance (Feet): 100 Feet Assistive device: Rolling walker (2 wheels) Gait Pattern/deviations: Step-through pattern, Decreased stride length Gait velocity: decreased Gait velocity interpretation: <1.31 ft/sec, indicative of household ambulator   General Gait Details: steady gait with RW. Pt reports her legs feel weak with increased distance. No knee buckling noted.  Stairs            Wheelchair Mobility    Modified Rankin (Stroke Patients Only)       Balance Overall balance assessment: Needs assistance Sitting-balance support: No upper extremity supported, Feet supported Sitting balance-Leahy Scale: Good     Standing balance support: Bilateral upper extremity supported, During functional activity, No upper extremity supported Standing balance-Leahy Scale: Fair Standing balance comment: RW for amb                             Pertinent Vitals/Pain Pain Assessment Pain Assessment: Faces Faces Pain Scale: Hurts little more Pain Location: back Pain Descriptors / Indicators: Discomfort, Operative site guarding Pain Intervention(s): Monitored during session, Repositioned    Home Living Family/patient expects to be discharged to:: Private residence Living Arrangements: Spouse/significant other Available Help at Discharge: Available 24 hours/day;Family Type of Home: House Home Access: Level entry       Home Layout: One level Home Equipment:  Rolling Walker (2 wheels);Shower seat - built in;Shower seat;Wheelchair - manual      Prior Function Prior Level of Function : Independent/Modified  Independent;Driving             Mobility Comments: Pt had started using RW just prior to admission due to increased back/LE pain.       Hand Dominance   Dominant Hand: Right    Extremity/Trunk Assessment   Upper Extremity Assessment Upper Extremity Assessment: Defer to OT evaluation    Lower Extremity Assessment Lower Extremity Assessment: Overall WFL for tasks assessed    Cervical / Trunk Assessment Cervical / Trunk Assessment: Back Surgery  Communication   Communication: No difficulties  Cognition Arousal/Alertness: Awake/alert Behavior During Therapy: WFL for tasks assessed/performed Overall Cognitive Status: Within Functional Limits for tasks assessed                                          General Comments General comments (skin integrity, edema, etc.): VSS on RA    Exercises     Assessment/Plan    PT Assessment Patient needs continued PT services  PT Problem List Decreased balance;Decreased knowledge of precautions;Pain;Decreased mobility;Decreased activity tolerance       PT Treatment Interventions Functional mobility training;Balance training;Patient/family education;Gait training;Therapeutic activities;Therapeutic exercise    PT Goals (Current goals can be found in the Care Plan section)  Acute Rehab PT Goals Patient Stated Goal: home PT Goal Formulation: With patient Time For Goal Achievement: 08/04/22 Potential to Achieve Goals: Good    Frequency Min 5X/week     Co-evaluation               AM-PAC PT "6 Clicks" Mobility  Outcome Measure Help needed turning from your back to your side while in a flat bed without using bedrails?: None Help needed moving from lying on your back to sitting on the side of a flat bed without using bedrails?: A Little Help needed moving to and from a bed to a chair (including a wheelchair)?: A Little Help needed standing up from a chair using your arms (e.g., wheelchair or bedside chair)?:  A Little Help needed to walk in hospital room?: A Little Help needed climbing 3-5 steps with a railing? : A Little 6 Click Score: 19    End of Session Equipment Utilized During Treatment: Gait belt;Back brace Activity Tolerance: Patient tolerated treatment well Patient left: in chair;with call bell/phone within reach;with chair alarm set;with family/visitor present Nurse Communication: Mobility status PT Visit Diagnosis: Difficulty in walking, not elsewhere classified (R26.2);Pain    Time: 6811-5726 PT Time Calculation (min) (ACUTE ONLY): 24 min   Charges:   PT Evaluation $PT Eval Moderate Complexity: 1 Mod PT Treatments $Gait Training: 8-22 mins        Gloriann Loan., PT  Office # [phone]   Lorriane Shire 07/28/2022, 10:11 AM

## 2022-07-29 MED ORDER — TIZANIDINE HCL 4 MG PO TABS: 4.0000 mg | ORAL_TABLET | Freq: Three times a day (TID) | ORAL | 0 refills | Status: DC | PRN

## 2022-07-29 MED ORDER — OXYCODONE-ACETAMINOPHEN 10-325 MG PO TABS: 1.0000 | ORAL_TABLET | ORAL | 0 refills | Status: DC | PRN

## 2022-07-29 MED ORDER — CYCLOBENZAPRINE HCL 10 MG PO TABS: 5.0000 mg | ORAL_TABLET | Freq: Three times a day (TID) | ORAL | Status: DC | PRN

## 2022-07-29 NOTE — Progress Notes (Signed)
Physical Therapy Treatment Patient Details Name: Courtney [name] MRN: [id number] DOB: 1945-09-28 Today's Date: 07/29/2022   History of Present Illness Pt is a 77 y.o. female s/p PLIF 07/26/22. PMH: HTN, SVT, depression, OA, CAD, h/o previous back sx    PT Comments    Excellent progress with mobility. Mod I bed mobility and transfers. Supervision amb 250' with RW. Steady gait. Pt reports back pain 5/10. Reviewed back precautions and brace wear schedule. Pt in recliner at end of session.    Recommendations for follow up therapy are one component of a multi-disciplinary discharge planning process, led by the attending physician.  Recommendations may be updated based on patient status, additional functional criteria and insurance authorization.  Follow Up Recommendations  No PT follow up     Assistance Recommended at Discharge PRN  Patient can return home with the following A little help with bathing/dressing/bathroom;Assistance with cooking/housework;Assist for transportation   Equipment Recommendations  None recommended by PT    Recommendations for Other Services       Precautions / Restrictions Precautions Precautions: Back Precaution Comments: reviewed back precautions Required Braces or Orthoses: Spinal Brace Spinal Brace: Lumbar corset;Applied in sitting position     Mobility  Bed Mobility Overal bed mobility: Modified Independent                  Transfers Overall transfer level: Modified independent                      Ambulation/Gait Ambulation/Gait assistance: Supervision Gait Distance (Feet): 250 Feet Assistive device: Rolling walker (2 wheels) Gait Pattern/deviations: WFL(Within Functional Limits) Gait velocity: WFL Gait velocity interpretation: 1.31 - 2.62 ft/sec, indicative of limited community ambulator   General Gait Details: steady gait with RW   Stairs             Wheelchair Mobility    Modified Rankin (Stroke Patients  Only)       Balance Overall balance assessment: Needs assistance Sitting-balance support: No upper extremity supported, Feet supported Sitting balance-Leahy Scale: Good     Standing balance support: Bilateral upper extremity supported, During functional activity, No upper extremity supported Standing balance-Leahy Scale: Fair Standing balance comment: RW for amb                            Cognition Arousal/Alertness: Awake/alert Behavior During Therapy: WFL for tasks assessed/performed Overall Cognitive Status: Within Functional Limits for tasks assessed                                          Exercises      General Comments General comments (skin integrity, edema, etc.): VSS on RA      Pertinent Vitals/Pain Pain Assessment Pain Assessment: 0-10 Pain Score: 5     Home Living                          Prior Function            PT Goals (current goals can now be found in the care plan section) Acute Rehab PT Goals Patient Stated Goal: home Progress towards PT goals: Progressing toward goals    Frequency    Min 5X/week      PT Plan Current plan remains appropriate    Co-evaluation  AM-PAC PT "6 Clicks" Mobility   Outcome Measure  Help needed turning from your back to your side while in a flat bed without using bedrails?: None Help needed moving from lying on your back to sitting on the side of a flat bed without using bedrails?: None Help needed moving to and from a bed to a chair (including a wheelchair)?: None Help needed standing up from a chair using your arms (e.g., wheelchair or bedside chair)?: None Help needed to walk in hospital room?: A Little Help needed climbing 3-5 steps with a railing? : A Little 6 Click Score: 22    End of Session Equipment Utilized During Treatment: Back brace Activity Tolerance: Patient tolerated treatment well Patient left: in chair;with call bell/phone  within reach Nurse Communication: Mobility status PT Visit Diagnosis: Difficulty in walking, not elsewhere classified (R26.2);Pain     Time: [id number] PT Time Calculation (min) (ACUTE ONLY): 10 min  Charges:  $Gait Training: 8-22 mins                     Gloriann Loan., PT  Office # [phone]    Lorriane Shire 07/29/2022, 8:52 AM

## 2022-07-29 NOTE — Discharge Summary (Signed)
Physician Discharge Summary  Patient ID: Courtney [name] MRN: [id number] DOB/AGE: 12/05/1945 77 y.o.  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  The surgery went well.  The patient's postoperative course was unremarkable.  On postoperative day #2 the patient felt well and requested discharge to home.  She was given verbal and written discharge instructions.  All her questions were answered.  Consults: PT, care management Significant Diagnostic Studies: None Treatments: Exploration of lumbar fusion, L2 to the ilium decompression, instrumentation and fusion. Discharge Exam: Blood pressure (!) 111/57, pulse 85, temperature 98.4 F (36.9 C), temperature source Oral, resp. rate 17, height 5' 5"$  (1.651 m), weight 63.5 kg, SpO2 91 %. The patient is alert and pleasant.  She looks well.  Her strength is grossly normal.  Disposition: Home  Discharge Instructions     Call MD for:  difficulty breathing, headache or visual disturbances   Complete by: As directed    Call MD for:  extreme fatigue   Complete by: As directed    Call MD for:  hives   Complete by: As directed    Call MD for:  persistant dizziness or light-headedness   Complete by: As directed    Call MD for:  persistant nausea and vomiting   Complete by: As directed    Call MD for:  redness, tenderness, or signs of infection (pain, swelling, redness, odor or green/yellow discharge around incision site)   Complete by: As directed    Call MD for:  severe uncontrolled  pain   Complete by: As directed    Call MD for:  temperature >100.4   Complete by: As directed    Diet - low sodium heart healthy   Complete by: As directed    Discharge instructions   Complete by: As directed    Call [phone] for a followup appointment. Take a stool softener while you are using pain medications.   Driving Restrictions   Complete by: As directed    Do not drive for 2 weeks.   Increase activity slowly   Complete by: As directed    Lifting restrictions   Complete by: As directed    Do not lift more than 5 pounds. No excessive bending or twisting.   May shower / Bathe   Complete by: As directed    Remove the dressing for 3 days after surgery.  You may shower, but leave the incision alone.   Remove dressing in 24 hours   Complete by: As directed       Allergies as of 07/29/2022       Reactions   Levofloxacin Hives, Shortness Of Breath, Swelling, Other (See Comments)   RASH IN MOUTH   (can take IV route)   Augmentin [amoxicillin-pot Clavulanate] Nausea Only   Bactrim [sulfamethoxazole-trimethoprim] Hives, Itching   Clarithromycin    GI upset Other reaction(s): GI Upset (intolerance) GI upset -can take but does cause mild upset    Lyrica [pregabalin]    Nexletol [bempedoic Acid]    Muscle pain   Repatha [evolocumab]    Legs hurt.  Pt is currently taking repatha   Statins  Other (See Comments)   Myalgias   Zetia [ezetimibe]    Muscle pain        Medication List     STOP taking these medications    meloxicam 15 MG tablet Commonly known as: MOBIC       TAKE these medications    amLODipine 5 MG tablet Commonly known as: NORVASC Take 5 mg by mouth daily.   budesonide-formoterol 160-4.5 MCG/ACT inhaler Commonly known as: Symbicort Inhale 2 puffs into the lungs in the morning and at bedtime.   cetirizine 10 MG tablet Commonly known as: ZYRTEC Take 1 tablet (10 mg total) by mouth daily. What changed:  when to take this reasons to take  this   D-Mannose 350 MG Caps Take 350 mg by mouth daily.   docusate sodium 100 MG capsule Commonly known as: COLACE Take 300-400 mg by mouth at bedtime.   DULoxetine 30 MG capsule Commonly known as: Cymbalta Take 1 capsule (30 mg total) by mouth daily for 3 days, THEN 2 capsules (60 mg total) daily for 27 days. Start taking on: July 06, 2022   furosemide 20 MG tablet Commonly known as: LASIX TAKE 1 TABLET(20 MG) BY MOUTH DAILY What changed:  how much to take how to take this when to take this reasons to take this additional instructions   gabapentin 600 MG tablet Commonly known as: NEURONTIN Take 1 tablet (600 mg total) by mouth at bedtime. As needed   ipratropium 0.02 % nebulizer solution Commonly known as: ATROVENT Take 2.5 mLs (0.5 mg total) by nebulization 4 (four) times daily. What changed:  when to take this reasons to take this   levothyroxine 75 MCG tablet Commonly known as: SYNTHROID TAKE 1 TABLET(75 MCG) BY MOUTH DAILY   lisinopril 5 MG tablet Commonly known as: ZESTRIL Take 5 mg by mouth daily.   metoprolol tartrate 50 MG tablet Commonly known as: LOPRESSOR TAKE 1 TABLET(50 MG) BY MOUTH TWICE DAILY WITH MEALS   multivitamin with minerals tablet Take 1 tablet by mouth daily.   nitroGLYCERIN 0.4 MG SL tablet Commonly known as: NITROSTAT PLACE 1 TABLET UNDER THE TONGUE EVERY 5 MINUTES AS NEEDED FOR CHEST PAIN.   omeprazole 40 MG capsule Commonly known as: PRILOSEC Take 1 capsule (40 mg total) by mouth daily. What changed:  when to take this reasons to take this   oxyCODONE-acetaminophen 10-325 MG tablet Commonly known as: PERCOCET Take 1 tablet by mouth every 3 (three) hours as needed for pain (max of 5 per day). What changed: when to take this   phenazopyridine 100 MG tablet Commonly known as: Pyridium Take 1 tablet (100 mg total) by mouth 3 (three) times daily as needed for pain.   predniSONE 10 MG tablet Commonly known as:  DELTASONE Take 1 tablet (10 mg total) by mouth daily with breakfast.   Repatha SureClick [id number] MG/ML Soaj Generic drug: Evolocumab Inject 140 mg into the skin every 14 (fourteen) days.   tirzepatide 10 MG/0.5ML Pen Commonly known as: MOUNJARO Inject 10 mg into the skin once a week.   tiZANidine 4 MG tablet Commonly known as: ZANAFLEX Take 1 tablet (4 mg total) by mouth every 8 (eight) hours as needed for muscle spasms. What changed:  when to take this reasons to take this         Signed: Ophelia Charter 07/29/2022, 7:48 AM

## 2022-07-29 NOTE — Plan of Care (Signed)

## 2022-07-30 ENCOUNTER — Other Ambulatory Visit: Payer: Self-pay | Admitting: Family Medicine

## 2022-07-30 ENCOUNTER — Other Ambulatory Visit: Payer: Self-pay | Admitting: Physician Assistant

## 2022-08-01 ENCOUNTER — Telehealth: Payer: Self-pay | Admitting: *Deleted

## 2022-08-01 ENCOUNTER — Ambulatory Visit: Payer: Medicare Other | Admitting: Family Medicine

## 2022-08-01 ENCOUNTER — Encounter: Payer: Self-pay | Admitting: *Deleted

## 2022-08-01 DIAGNOSIS — Z79899 Other long term (current) drug therapy: Secondary | ICD-10-CM | POA: Diagnosis not present

## 2022-08-01 DIAGNOSIS — M961 Postlaminectomy syndrome, not elsewhere classified: Secondary | ICD-10-CM | POA: Diagnosis not present

## 2022-08-01 DIAGNOSIS — G894 Chronic pain syndrome: Secondary | ICD-10-CM | POA: Diagnosis not present

## 2022-08-01 NOTE — Transitions of Care (Post Inpatient/ED Visit) (Signed)
08/01/2022  Name: Courtney [name] MRN: [id number] DOB: 20-Jul-1945  Today's TOC FU Call Status: Today's TOC FU Call Status:: Successful TOC FU Call Competed TOC FU Call Complete Date: 08/01/22  Transition Care Management Follow-up Telephone Call Date of Discharge: 07/29/22 Discharge Facility: [facility] Type of Discharge: Inpatient Admission Primary Inpatient Discharge Diagnosis:: lumbar fusion; scoliosis How have you been since you were released from the hospital?: Same Any questions or concerns?: Yes Patient Questions/Concerns:: "I am doing okay, but my painis not being managed very well; I have called both my surgeon and my regular pain management doctor-- I am waiting on the surgeon's nurse to call me back-- they also told me when I called this morning that they would be scheduling my follow up appointment with dr. Arnoldo Morale too.  I don't want to see Dr. Tobie Poet until I see the surgeon first-- they did not recommend that I see Dr. Tobie Poet after my recent surgery" (verified no PCP appointment recommended at time of hospital discharge on 07/29/22) Patient Questions/Concerns Addressed: Other: (confirmed patient has neurosurgery contact information; encouraged her to call back if she does not hear from them promptly after her call to their office earlier today)  Items Reviewed: Did you receive and understand the discharge instructions provided?: Yes (thoroughly reviewed with patient today) Medications obtained and verified?: Yes (Medications Reviewed) (declined full medication review; confirmed patient obtained and is taking pain medication post-surgery as instructed; she denies medication concerns/ questions; self-manages medications) Any new allergies since your discharge?: No Dietary orders reviewed?: NA Do you have support at home?: Yes People in Home: child(ren), adult, sibling(s) Name of Support/Comfort Primary Source: "My daughter and sister are here taking care of anything I need; I am able to do most  everything on my own"  Home Care and Equipment/Supplies: [place] Ordered?: No Any new equipment or medical supplies ordered?: No  Functional Questionnaire: Do you need assistance with bathing/showering or dressing?: Yes Do you need assistance with meal preparation?: No Do you need assistance with eating?: No Do you have difficulty maintaining continence: No Do you need assistance with getting out of bed/getting out of a chair/moving?: No Do you have difficulty managing or taking your medications?: No  Folllow up appointments reviewed: PCP Follow-up appointment confirmed?: No (verified no PCP hospital follow up visit recommended at time of hospital discharge on 07/29/22- only neursurgery specialty provider recommended: confirmed patient has already called surgeon's office to schedule- waiting on call back) MD Provider Line Number:[phone] Given: No [facility] Follow-up appointment confirmed?: No Reason Specialist Follow-Up Not Confirmed: Patient has Specialist Provider Number and will Call for Appointment (confirmed patient has already called surgeon's office to schedule- waiting on call back) Do you need transportation to your follow-up appointment?: No Do you understand care options if your condition(s) worsen?: Yes-patient verbalized understanding  SDOH Interventions Today    Flowsheet Row Most Recent Value  SDOH Interventions   Food Insecurity Interventions Intervention Not Indicated  Transportation Interventions Intervention Not Indicated  [patient normally drives self,  family providing transportation post surgery on 07/29/22]      Interventions Today    Flowsheet Row Most Recent Value  General Interventions   General Interventions Discussed/Reviewed General Interventions Discussed, Doctor Visits  Doctor Visits Discussed/Reviewed Specialist, Doctor Visits Discussed  PCP/Specialist Visits Compliance with follow-up visit  Education Interventions    Education Provided Provided Verbal Education  Provided Verbal Education On Medication  [pain management control]  Pharmacy Interventions   Pharmacy Dicussed/Reviewed Pharmacy Topics  Discussed  [confirmed obtained/ is taking all newly prescribed medications post-hospital discharge on 07/29/22,  confirmed pt. self manages medications,  denies questions/ concerns aorund medications,  declined full medication review today]      TOC Interventions Today    Flowsheet Row Most Recent Value  TOC Interventions   TOC Interventions Discussed/Reviewed TOC Interventions Discussed      Oneta Rack, RN, BSN, CCRN Alumnus RN CM Care Coordination/ Transition of [place] Management [phone]: direct office

## 2022-08-09 ENCOUNTER — Other Ambulatory Visit: Payer: Self-pay | Admitting: Family Medicine

## 2022-08-09 DIAGNOSIS — E663 Overweight: Secondary | ICD-10-CM

## 2022-08-09 DIAGNOSIS — Z6825 Body mass index (BMI) 25.0-25.9, adult: Secondary | ICD-10-CM

## 2022-08-09 DIAGNOSIS — R7303 Prediabetes: Secondary | ICD-10-CM

## 2022-08-16 DIAGNOSIS — M48062 Spinal stenosis, lumbar region with neurogenic claudication: Secondary | ICD-10-CM | POA: Diagnosis not present

## 2022-08-25 ENCOUNTER — Other Ambulatory Visit: Payer: Self-pay | Admitting: Family Medicine

## 2022-08-25 DIAGNOSIS — E663 Overweight: Secondary | ICD-10-CM

## 2022-08-25 DIAGNOSIS — R7303 Prediabetes: Secondary | ICD-10-CM

## 2022-09-01 DIAGNOSIS — M48062 Spinal stenosis, lumbar region with neurogenic claudication: Secondary | ICD-10-CM | POA: Diagnosis not present

## 2022-09-06 ENCOUNTER — Ambulatory Visit (INDEPENDENT_AMBULATORY_CARE_PROVIDER_SITE_OTHER): Payer: Medicare Other | Admitting: Physician Assistant

## 2022-09-06 ENCOUNTER — Encounter: Payer: Self-pay | Admitting: Physician Assistant

## 2022-09-06 ENCOUNTER — Other Ambulatory Visit: Payer: Self-pay | Admitting: Physician Assistant

## 2022-09-06 VITALS — BP 102/62 | HR 71 | Temp 97.3°F | Ht 65.0 in | Wt 153.0 lb

## 2022-09-06 DIAGNOSIS — N3000 Acute cystitis without hematuria: Secondary | ICD-10-CM | POA: Diagnosis not present

## 2022-09-06 DIAGNOSIS — R600 Localized edema: Secondary | ICD-10-CM | POA: Diagnosis not present

## 2022-09-06 DIAGNOSIS — R5381 Other malaise: Secondary | ICD-10-CM | POA: Insufficient documentation

## 2022-09-06 LAB — POCT URINALYSIS DIP (CLINITEK)
Bilirubin, UA: NEGATIVE
Blood, UA: NEGATIVE
Glucose, UA: NEGATIVE mg/dL
Nitrite, UA: NEGATIVE
POC PROTEIN,UA: NEGATIVE
Spec Grav, UA: 1.01 (ref 1.010–1.025)
Urobilinogen, UA: 0.2 E.U./dL
pH, UA: 5 (ref 5.0–8.0)

## 2022-09-06 MED ORDER — CIPROFLOXACIN HCL 500 MG PO TABS: 500.0000 mg | ORAL_TABLET | Freq: Two times a day (BID) | ORAL | 0 refills | Status: AC

## 2022-09-06 NOTE — Progress Notes (Signed)
Acute Office Visit  Subjective:    Patient ID: Courtney [name], female    DOB: 10-11-1945, 77 y.o.   MRN: [id number]  Chief Complaint  Patient presents with   Edema    Bilateral feet and legs   Fatigue    HPI Patient is in today for complaints of malaise.  She states over the past few weeks she has felt more tired than usual however she recently had back surgery and has been traveling back and forth staying with her husband who is at [facility] and not resting well. She denies fever, chest pain or dyspnea.  Denies nausea, vomiting, abdominal pain  She does mention she has had some mild urine frequency and urgency over the past week The initial reason she was scheduled for appt was because she had been having swelling in both of her lower legs.  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 [id number]   Followed in Pulmonary clinic/ [facility]/ [place]    Cardiac dysrhythmia    Chronic idiopathic constipation    Colitis, acute 02/08/2021   Coronary artery disease    Depression, major, recurrent, moderate (HCC)    Headache    Heart murmur    History of bladder infections    History of COVID-19    Hyperlipidemia    Hypertension    Hypothyroidism    Knee pain, bilateral    Melena 02/08/2021   Osteoarthritis    Pneumonia    Primary insomnia    Recovering alcoholic (HCC)    SVT (supraventricular tachycardia)    UTI (urinary tract infection)    Varicose veins    Vitamin B12 deficiency     Past Surgical History:  Procedure Laterality Date   ABDOMINAL  HYSTERECTOMY  1991   APPENDECTOMY     BACK SURGERY     had 2 surgeries. Have rods placed in back    BREAST BIOPSY Left    x2   BREAST ENHANCEMENT SURGERY  2006   CARDIAC CATHETERIZATION  12/22/2020   stents placed   CHOLECYSTECTOMY  12/2019   removed lap band.    COLONOSCOPY  12/01/2016   Moderate predominantly sigmod diverticulosis. Otherwise grossly normal colonoscopy   EYE SURGERY     IOL- bilateral - [place]   KNEE ARTHROSCOPY Left    lap band surgery  1981   LUMBAR FUSION  07/29/2015   posterior level one   removal of cervical disc fragments  10/2007   pt. denies    TUBAL LIGATION  1970    Family History  Problem Relation Age of Onset   Heart disease Mother    Heart disease Father    Diabetes Sister    Other Sister        BRAIN TUMOR   Heart disease Brother    Heart disease Brother    Heart disease Brother    Heart disease Brother    Heart disease Brother    Colon cancer Maternal Aunt    Colon cancer  Cousin     Social History   Socioeconomic History   Marital status: Married    Spouse name: Not on file   Number of children: 2   Years of education: Not on file   Highest education level: Not on file  Occupational History   Occupation: retired    Comment: Marine scientist  Tobacco Use   Smoking status: Never   Smokeless tobacco: Never  Vaping Use   Vaping Use: Never used  Substance and Sexual Activity   Alcohol use: No    Alcohol/week: 0.0 standard drinks of alcohol    Comment: recovery x 20 yrs   Drug use: No   Sexual activity: Not Currently    Comment: MARRIED  Other Topics Concern   Not on file  Social History Narrative   Not on file   Social Determinants of Health   Financial Resource Strain: Medium Risk (11/08/2021)   Overall Financial Resource Strain (CARDIA)    Difficulty of Paying Living Expenses: Somewhat hard  Food Insecurity: No Food Insecurity (08/01/2022)   Hunger Vital Sign    Worried About Running Out of Food in the Last Year: Never  true    Ran Out of Food in the Last Year: Never true  Transportation Needs: No Transportation Needs (08/01/2022)   PRAPARE - Hydrologist (Medical): No    Lack of Transportation (Non-Medical): No  Physical Activity: Not on file  Stress: Not on file  Social Connections: Not on file  Intimate Partner Violence: Not on file     Current Outpatient Medications:    amLODipine (NORVASC) 5 MG tablet, Take 5 mg by mouth daily., Disp: , Rfl:    budesonide-formoterol (SYMBICORT) 160-4.5 MCG/ACT inhaler, Inhale 2 puffs into the lungs in the morning and at bedtime., Disp: 1 each, Rfl: 6   cetirizine (ZYRTEC) 10 MG tablet, Take 1 tablet (10 mg total) by mouth daily. (Patient taking differently: Take 10 mg by mouth daily as needed for allergies.), Disp: 90 tablet, Rfl: 3   ciprofloxacin (CIPRO) 500 MG tablet, Take 1 tablet (500 mg total) by mouth 2 (two) times daily for 10 days., Disp: 20 tablet, Rfl: 0   D-Mannose 350 MG CAPS, Take 350 mg by mouth daily., Disp: , Rfl:    docusate sodium (COLACE) 100 MG capsule, Take 300-400 mg by mouth at bedtime., Disp: , Rfl:    DULoxetine (CYMBALTA) 30 MG capsule, TAKE 2 CAPSULES(60 MG) BY MOUTH DAILY, Disp: 180 capsule, Rfl: 1   furosemide (LASIX) 20 MG tablet, TAKE 1 TABLET(20 MG) BY MOUTH DAILY (Patient taking differently: Take 20 mg by mouth daily as needed for edema.), Disp: 90 tablet, Rfl: 0   gabapentin (NEURONTIN) 600 MG tablet, TAKE 1 TABLET(600 MG) BY MOUTH AT BEDTIME AS NEEDED, Disp: 90 tablet, Rfl: 1   ipratropium (ATROVENT) 0.02 % nebulizer solution, Take 2.5 mLs (0.5 mg total) by nebulization 4 (four) times daily. (Patient taking differently: Take 0.5 mg by nebulization every 6 (six) hours as needed for wheezing or shortness of breath.), Disp: 100 mL, Rfl: 12   levothyroxine (SYNTHROID) 75 MCG tablet, TAKE 1 TABLET(75 MCG) BY MOUTH DAILY, Disp: 90 tablet, Rfl: 1   lidocaine (LIDODERM) 5 %, 1 patch daily., Disp: , Rfl:     lisinopril (ZESTRIL) 5 MG tablet, Take 5 mg by mouth daily., Disp: , Rfl:    metoprolol tartrate (LOPRESSOR) 50 MG tablet, TAKE 1 TABLET(50 MG) BY MOUTH TWICE DAILY WITH MEALS, Disp: 180 tablet, Rfl:  1   Multiple Vitamins-Minerals (MULTIVITAMIN WITH MINERALS) tablet, Take 1 tablet by mouth daily., Disp: , Rfl:    nitroGLYCERIN (NITROSTAT) 0.4 MG SL tablet, PLACE 1 TABLET UNDER THE TONGUE EVERY 5 MINUTES AS NEEDED FOR CHEST PAIN., Disp: 25 tablet, Rfl: 1   omeprazole (PRILOSEC) 40 MG capsule, Take 1 capsule (40 mg total) by mouth daily as needed (acid reflux)., Disp: 90 capsule, Rfl: 1   oxyCODONE-acetaminophen (PERCOCET) 10-325 MG tablet, Take 1 tablet by mouth every 3 (three) hours as needed for pain (max of 5 per day)., Disp: 50 tablet, Rfl: 0   phenazopyridine (PYRIDIUM) 100 MG tablet, Take 1 tablet (100 mg total) by mouth 3 (three) times daily as needed for pain., Disp: 10 tablet, Rfl: 0   REPATHA SURECLICK [id number] MG/ML SOAJ, Inject 140 mg into the skin every 14 (fourteen) days., Disp: , Rfl:    tirzepatide (MOUNJARO) 10 MG/0.5ML Pen, Inject 10 mg into the skin once a week., Disp: 6 mL, Rfl: 0   tiZANidine (ZANAFLEX) 4 MG tablet, Take 1 tablet (4 mg total) by mouth every 8 (eight) hours as needed for muscle spasms., Disp: 30 tablet, Rfl: 0   Allergies  Allergen Reactions   Levofloxacin Hives, Shortness Of Breath, Swelling and Other (See Comments)    RASH IN MOUTH   (can take IV route)   Augmentin [Amoxicillin-Pot Clavulanate] Nausea Only   Bactrim [Sulfamethoxazole-Trimethoprim] Hives and Itching   Clarithromycin     GI upset Other reaction(s): GI Upset (intolerance) GI upset -can take but does cause mild upset    Lyrica [Pregabalin]    Nexletol [Bempedoic Acid]     Muscle pain   Repatha [Evolocumab]     Legs hurt.   Pt is currently taking repatha   Statins Other (See Comments)    Myalgias   Zetia [Ezetimibe]     Muscle pain    CONSTITUTIONAL: see HPI E/N/T: Negative for ear pain,  nasal congestion and sore throat.  CARDIOVASCULAR: Negative for chest pain, dizziness, palpitations - has had edema RESPIRATORY: Negative for recent cough and dyspnea.  GASTROINTESTINAL: Negative for abdominal pain, acid reflux symptoms, constipation, diarrhea, nausea and vomiting.  GU - see HPI MSK: Negative for arthralgias and myalgias.  INTEGUMENTARY: Negative for rash.       Objective:    PHYSICAL EXAM:   VS: BP 102/62   Pulse 71   Temp (!) 97.3 F (36.3 C) (Temporal)   Ht 5\' 5"  (1.651 m)   Wt 153 lb (69.4 kg)   SpO2 95%   BMI 25.46 kg/m   GEN: Well nourished, well developed, in no acute distress  Cardiac: RRR; no murmurs, rubs, or gallops,no edema - Respiratory:  normal respiratory rate and pattern with no distress - normal breath sounds with no rales, rhonchi, wheezes or rubs GI: normal bowel sounds, no masses or tenderness Psych: euthymic mood, appropriate affect and demeanor  Office Visit on 09/06/2022  Component Date Value Ref Range Status   Color, UA 09/06/2022 yellow  yellow Final   Clarity, UA 09/06/2022 cloudy (A)  clear Final   Glucose, UA 09/06/2022 negative  negative mg/dL Final   Bilirubin, UA 09/06/2022 negative  negative Final   Ketones, POC UA 09/06/2022 trace (5) (A)  negative mg/dL Final   Spec Grav, UA 09/06/2022 1.010  1.010 - 1.025 Final   Blood, UA 09/06/2022 negative  negative Final   pH, UA 09/06/2022 5.0  5.0 - 8.0 Final   POC PROTEIN,UA 09/06/2022 negative  negative,  trace Final   Urobilinogen, UA 09/06/2022 0.2  0.2 or 1.0 E.U./dL Final   Nitrite, UA 09/06/2022 Negative  Negative Final   Leukocytes, UA 09/06/2022 Trace (A)  Negative Final     Wt Readings from Last 3 Encounters:  09/06/22 153 lb (69.4 kg)  07/27/22 140 lb (63.5 kg)  07/20/22 149 lb 9.6 oz (67.9 kg)    Health Maintenance Due  Topic Date Due   DTaP/Tdap/Td (1 - Tdap) Never done   Zoster Vaccines- Shingrix (1 of 2) Never done   COLONOSCOPY (Pts 45-8yrs Insurance  coverage will need to be confirmed)  12/01/2021    There are no preventive care reminders to display for this patient.        Assessment & Plan:   Problem List Items Addressed This Visit       Genitourinary   Acute infective cystitis - Primary   Relevant Medications   ciprofloxacin (CIPRO) 500 MG tablet   Other Relevant Orders   POCT URINALYSIS DIP (CLINITEK) Urine culture pending     Other   Malaise   Relevant Orders   CBC with Differential/Platelet   Comprehensive metabolic panel   TSH   Lower extremity edema   Relevant Orders   Comprehensive metabolic panel Recommend to only take lasix 20mg  prn not 40mg  qd  Recheck bp in 2 weeks or follow up sooner if any symptoms change or worsen     Meds ordered this encounter  Medications   ciprofloxacin (CIPRO) 500 MG tablet    Sig: Take 1 tablet (500 mg total) by mouth 2 (two) times daily for 10 days.    Dispense:  20 tablet    Refill:  0    Order Specific Question:   Supervising Provider    Answer:   [name], KIRSTEN [id number]     [place], PA-C

## 2022-09-07 ENCOUNTER — Other Ambulatory Visit: Payer: Self-pay | Admitting: Physician Assistant

## 2022-09-07 DIAGNOSIS — R899 Unspecified abnormal finding in specimens from other organs, systems and tissues: Secondary | ICD-10-CM

## 2022-09-07 LAB — CBC WITH DIFFERENTIAL/PLATELET
Basophils Absolute: 0.1 10*3/uL (ref 0.0–0.2)
Basos: 1 %
EOS (ABSOLUTE): 0.6 10*3/uL — ABNORMAL HIGH (ref 0.0–0.4)
Eos: 7 %
Hematocrit: 31 % — ABNORMAL LOW (ref 34.0–46.6)
Hemoglobin: 10.3 g/dL — ABNORMAL LOW (ref 11.1–15.9)
Immature Grans (Abs): 0 10*3/uL (ref 0.0–0.1)
Immature Granulocytes: 0 %
Lymphocytes Absolute: 1.9 10*3/uL (ref 0.7–3.1)
Lymphs: 25 %
MCH: 30.8 pg (ref 26.6–33.0)
MCHC: 33.2 g/dL (ref 31.5–35.7)
MCV: 93 fL (ref 79–97)
Monocytes Absolute: 0.9 10*3/uL (ref 0.1–0.9)
Monocytes: 11 %
Neutrophils Absolute: 4.3 10*3/uL (ref 1.4–7.0)
Neutrophils: 56 %
Platelets: 352 10*3/uL (ref 150–450)
RBC: 3.34 x10E6/uL — ABNORMAL LOW (ref 3.77–5.28)
RDW: 11.8 % (ref 11.7–15.4)
WBC: 7.6 10*3/uL (ref 3.4–10.8)

## 2022-09-07 LAB — COMPREHENSIVE METABOLIC PANEL
ALT: 7 IU/L (ref 0–32)
AST: 16 IU/L (ref 0–40)
Albumin/Globulin Ratio: 1.7 (ref 1.2–2.2)
Albumin: 4 g/dL (ref 3.8–4.8)
Alkaline Phosphatase: 171 IU/L — ABNORMAL HIGH (ref 44–121)
BUN/Creatinine Ratio: 30 — ABNORMAL HIGH (ref 12–28)
BUN: 45 mg/dL — ABNORMAL HIGH (ref 8–27)
Bilirubin Total: <0.2 mg/dL (ref 0.0–1.2)
CO2: 27 mmol/L (ref 20–29)
Calcium: 9.5 mg/dL (ref 8.7–10.3)
Chloride: 90 mmol/L — ABNORMAL LOW (ref 96–106)
Creatinine, Ser: 1.51 mg/dL — ABNORMAL HIGH (ref 0.57–1.00)
Globulin, Total: 2.4 g/dL (ref 1.5–4.5)
Glucose: 87 mg/dL (ref 70–99)
Potassium: 5.6 mmol/L — ABNORMAL HIGH (ref 3.5–5.2)
Sodium: 132 mmol/L — ABNORMAL LOW (ref 134–144)
Total Protein: 6.4 g/dL (ref 6.0–8.5)
eGFR: 36 mL/min/{1.73_m2} — ABNORMAL LOW (ref >59)

## 2022-09-07 LAB — URINE CULTURE

## 2022-09-07 LAB — TSH: TSH: 1.81 u[IU]/mL (ref 0.450–4.500)

## 2022-09-09 ENCOUNTER — Other Ambulatory Visit: Payer: Medicare Other

## 2022-09-11 ENCOUNTER — Other Ambulatory Visit: Payer: Self-pay

## 2022-09-11 DIAGNOSIS — R899 Unspecified abnormal finding in specimens from other organs, systems and tissues: Secondary | ICD-10-CM

## 2022-09-12 ENCOUNTER — Other Ambulatory Visit: Payer: Medicare Other

## 2022-09-12 DIAGNOSIS — R899 Unspecified abnormal finding in specimens from other organs, systems and tissues: Secondary | ICD-10-CM

## 2022-09-13 LAB — CBC WITH DIFFERENTIAL/PLATELET
Basophils Absolute: 0.1 10*3/uL (ref 0.0–0.2)
Basos: 1 %
EOS (ABSOLUTE): 0.6 10*3/uL — ABNORMAL HIGH (ref 0.0–0.4)
Eos: 7 %
Hematocrit: 31.5 % — ABNORMAL LOW (ref 34.0–46.6)
Hemoglobin: 10.3 g/dL — ABNORMAL LOW (ref 11.1–15.9)
Immature Grans (Abs): 0 10*3/uL (ref 0.0–0.1)
Immature Granulocytes: 0 %
Lymphocytes Absolute: 1.9 10*3/uL (ref 0.7–3.1)
Lymphs: 23 %
MCH: 30.1 pg (ref 26.6–33.0)
MCHC: 32.7 g/dL (ref 31.5–35.7)
MCV: 92 fL (ref 79–97)
Monocytes Absolute: 0.6 10*3/uL (ref 0.1–0.9)
Monocytes: 7 %
Neutrophils Absolute: 5.1 10*3/uL (ref 1.4–7.0)
Neutrophils: 62 %
Platelets: 390 10*3/uL (ref 150–450)
RBC: 3.42 x10E6/uL — ABNORMAL LOW (ref 3.77–5.28)
RDW: 11.6 % — ABNORMAL LOW (ref 11.7–15.4)
WBC: 8.3 10*3/uL (ref 3.4–10.8)

## 2022-09-13 LAB — COMPREHENSIVE METABOLIC PANEL
ALT: 8 IU/L (ref 0–32)
AST: 15 IU/L (ref 0–40)
Albumin/Globulin Ratio: 1.6 (ref 1.2–2.2)
Albumin: 4 g/dL (ref 3.8–4.8)
Alkaline Phosphatase: 151 IU/L — ABNORMAL HIGH (ref 44–121)
BUN/Creatinine Ratio: 18 (ref 12–28)
BUN: 21 mg/dL (ref 8–27)
Bilirubin Total: <0.2 mg/dL (ref 0.0–1.2)
CO2: 24 mmol/L (ref 20–29)
Calcium: 9.2 mg/dL (ref 8.7–10.3)
Chloride: 97 mmol/L (ref 96–106)
Creatinine, Ser: 1.18 mg/dL — ABNORMAL HIGH (ref 0.57–1.00)
Globulin, Total: 2.5 g/dL (ref 1.5–4.5)
Glucose: 104 mg/dL — ABNORMAL HIGH (ref 70–99)
Potassium: 4.9 mmol/L (ref 3.5–5.2)
Sodium: 136 mmol/L (ref 134–144)
Total Protein: 6.5 g/dL (ref 6.0–8.5)
eGFR: 48 mL/min/{1.73_m2} — ABNORMAL LOW (ref >59)

## 2022-09-22 ENCOUNTER — Ambulatory Visit: Payer: Medicare Other

## 2022-09-30 DIAGNOSIS — M48062 Spinal stenosis, lumbar region with neurogenic claudication: Secondary | ICD-10-CM | POA: Diagnosis not present

## 2022-10-11 ENCOUNTER — Telehealth: Payer: Self-pay

## 2022-10-11 ENCOUNTER — Other Ambulatory Visit: Payer: Self-pay

## 2022-10-11 DIAGNOSIS — R600 Localized edema: Secondary | ICD-10-CM

## 2022-10-11 MED ORDER — FUROSEMIDE 20 MG PO TABS: ORAL_TABLET | ORAL | 0 refills | Status: DC

## 2022-10-11 NOTE — Progress Notes (Signed)
Care Management & Coordination Services Pharmacy Team  Reason for Encounter: General adherence update   Contacted patient for general health update and medication adherence call.  Spoke with patient on 10/11/2022    What concerns do you have about your medications? No concerns  The patient denies side effects with their medications.   How often do you forget or accidentally miss a dose? Never  Do you use a pillbox? Yes  Are you having any problems getting your medications from your pharmacy? No  Has the cost of your medications been a concern? No  Since last visit with PharmD, no interventions have been made.   The patient has not had an ED visit since last contact.   The patient denies problems with their health.   Patient denies concerns or questions for Artelia Laroche, PharmD at this time.   Counseled patient on: [place] job taking medications  Pt declined to schedule any telephone follow ups. She has a lot going on with her husband in taking care of him. She has a full load and does not want to have any phone calls at this time. She stated she needs to focus on him for now. She stated she is good and everything with her health and medications are doing well.    Chart Updates:  Recent office visits:  09/06/22 Marianne Sofia PA-C. Seen for edema. Started Ciprofloxacin HCI 500mg .   07/13/22 Sula Soda FNP. Seen for UTI. Started on Ciprofloxacin and Phenazopyridine.   07/07/22 Blane Ohara MD. Telephone encounter. I sent prednisone 10 mg daily. See if this helps and if not increase to 10 mg 2 tablets daily.   07/06/22 Blane Ohara MD. Seen for Anxiety. Start on duloxetine (cymbalta) 30 mg daily x 3 days then increase to cymbalta 30 mg 2 daily. Increase mounjaro 10 mg weekly. D/C Dutasteride, Ezetimibe, Linaclotide.   06/08/22 Lurline Del FNP. Seen for UTI. Started on Prednisone 10mg .  05/20/22 Flonnie Hailstone NP. Seen for UTI. Started on Augmentin 875-125mg . Cranberry tablets  500 mg to 1, 000 mg daily   04/28/22 Caro Hight LPN. Seen for Medicare Annual Wellness. No med changes.   04/25/22 Blane Ohara MD. Seen for routine visit. D/C Ezetimibe 10mg , Fluoxetine 20mg , Lisinopril 5mg , Montelukast. Decreased Tirzepatide to 7.5mg .   Recent consult visits:  08/01/22 (Pain Management) Bebe Shaggy. Seen for failed back syndrome. Ordered Percocet 10-325mg .   05/06/22 (Cardiology) Blima Ledger NP. Seen for HTN. Ordered Lisinopril 5mg , Amlodipine 5mg  and Repatha 140mg /ml.   04/29/22 (Pain Management) Bebe Shaggy. Seen for Lumbar Facet Arthropathy. Ordered Linzess , Percocet 10-325mg .   Hospital visits:  Medication Reconciliation was completed by comparing discharge summary, patient's EMR and Pharmacy list, and upon discussion with patient.  Admitted to the hospital on 07/27/22 due to Posterior Lumbar Fusion. Discharge date was 07/29/22. Discharged from [facility].    CHANGE how you take: oxyCODONE-acetaminophen (PERCOCET) tiZANidine (ZANAFLEX)  STOP taking: meloxicam 15 MG tablet (MOBIC)  -All other medications will remain the same.    Medications: Outpatient Encounter Medications as of 10/11/2022  Medication Sig Note   amLODipine (NORVASC) 5 MG tablet Take 5 mg by mouth daily.    budesonide-formoterol (SYMBICORT) 160-4.5 MCG/ACT inhaler Inhale 2 puffs into the lungs in the morning and at bedtime.    cetirizine (ZYRTEC) 10 MG tablet Take 1 tablet (10 mg total) by mouth daily. (Patient taking differently: Take 10 mg by mouth daily as needed for allergies.)    D-Mannose  350 MG CAPS Take 350 mg by mouth daily.    docusate sodium (COLACE) 100 MG capsule Take 300-400 mg by mouth at bedtime.    DULoxetine (CYMBALTA) 30 MG capsule TAKE 2 CAPSULES(60 MG) BY MOUTH DAILY    furosemide (LASIX) 20 MG tablet TAKE 1 TABLET(20 MG) BY MOUTH DAILY (Patient taking differently: Take 20 mg by mouth daily as needed for edema.)     gabapentin (NEURONTIN) 600 MG tablet TAKE 1 TABLET(600 MG) BY MOUTH AT BEDTIME AS NEEDED    ipratropium (ATROVENT) 0.02 % nebulizer solution Take 2.5 mLs (0.5 mg total) by nebulization 4 (four) times daily. (Patient taking differently: Take 0.5 mg by nebulization every 6 (six) hours as needed for wheezing or shortness of breath.)    levothyroxine (SYNTHROID) 75 MCG tablet TAKE 1 TABLET(75 MCG) BY MOUTH DAILY    lidocaine (LIDODERM) 5 % 1 patch daily.    lisinopril (ZESTRIL) 5 MG tablet Take 5 mg by mouth daily.    metoprolol tartrate (LOPRESSOR) 50 MG tablet TAKE 1 TABLET(50 MG) BY MOUTH TWICE DAILY WITH MEALS    Multiple Vitamins-Minerals (MULTIVITAMIN WITH MINERALS) tablet Take 1 tablet by mouth daily.    nitroGLYCERIN (NITROSTAT) 0.4 MG SL tablet PLACE 1 TABLET UNDER THE TONGUE EVERY 5 MINUTES AS NEEDED FOR CHEST PAIN. 07/27/2022: Patient states she has not taken in over a month    omeprazole (PRILOSEC) 40 MG capsule Take 1 capsule (40 mg total) by mouth daily as needed (acid reflux).    oxyCODONE-acetaminophen (PERCOCET) 10-325 MG tablet Take 1 tablet by mouth every 3 (three) hours as needed for pain (max of 5 per day).    phenazopyridine (PYRIDIUM) 100 MG tablet Take 1 tablet (100 mg total) by mouth 3 (three) times daily as needed for pain.    REPATHA SURECLICK 140 MG/ML SOAJ Inject 140 mg into the skin every 14 (fourteen) days.    tirzepatide (MOUNJARO) 10 MG/0.5ML Pen Inject 10 mg into the skin once a week.    tiZANidine (ZANAFLEX) 4 MG tablet Take 1 tablet (4 mg total) by mouth every 8 (eight) hours as needed for muscle spasms.    No facility-administered encounter medications on file as of 10/11/2022.    Recent vitals BP Readings from Last 3 Encounters:  09/06/22 102/62  07/29/22 (!) 114/55  07/20/22 108/60   Pulse Readings from Last 3 Encounters:  09/06/22 71  07/29/22 91  07/20/22 69   Wt Readings from Last 3 Encounters:  09/06/22 153 lb (69.4 kg)  07/27/22 140 lb (63.5 kg)   07/20/22 149 lb 9.6 oz (67.9 kg)   BMI Readings from Last 3 Encounters:  09/06/22 25.46 kg/m  07/27/22 23.30 kg/m  07/20/22 24.89 kg/m    Recent lab results    Component Value Date/Time   NA 136 09/12/2022 1152   K 4.9 09/12/2022 1152   CL 97 09/12/2022 1152   CO2 24 09/12/2022 1152   GLUCOSE 104 (H) 09/12/2022 1152   GLUCOSE 100 (H) 07/20/2022 1059   BUN 21 09/12/2022 1152   CREATININE 1.18 (H) 09/12/2022 1152   CALCIUM 9.2 09/12/2022 1152    Lab Results  Component Value Date   CREATININE 1.18 (H) 09/12/2022   EGFR 48 (L) 09/12/2022   GFRNONAA 47 (L) 07/20/2022   GFRAA 76 02/17/2020   Lab Results  Component Value Date/Time   HGBA1C 5.3 02/08/2022 09:31 AM   HGBA1C 5.8 (H) 10/20/2021 10:01 AM    Lab Results  Component Value Date  CHOL 323 (H) 04/25/2022   HDL 55 04/25/2022   LDLCALC 241 (H) 04/25/2022   TRIG 143 04/25/2022   CHOLHDL 5.9 (H) 04/25/2022    Care Gaps: Annual wellness visit in last year? Yes  If Diabetic:None noted  Last eye exam / retinopathy screening: Last diabetic foot exam: Last UACR:   Star Rating Drugs:  Medication:  Last Fill: Day Supply Lisinopril   09/01/22-06/25/22 90ds   Roxana Hires, CMA Clinical Pharmacist Assistant  [phone]

## 2022-10-31 DIAGNOSIS — M533 Sacrococcygeal disorders, not elsewhere classified: Secondary | ICD-10-CM | POA: Diagnosis not present

## 2022-10-31 DIAGNOSIS — G894 Chronic pain syndrome: Secondary | ICD-10-CM | POA: Diagnosis not present

## 2022-10-31 DIAGNOSIS — M961 Postlaminectomy syndrome, not elsewhere classified: Secondary | ICD-10-CM | POA: Diagnosis not present

## 2022-10-31 DIAGNOSIS — Z79899 Other long term (current) drug therapy: Secondary | ICD-10-CM | POA: Diagnosis not present

## 2022-11-07 ENCOUNTER — Encounter: Payer: Self-pay | Admitting: Family Medicine

## 2022-11-07 ENCOUNTER — Other Ambulatory Visit: Payer: Self-pay | Admitting: Family Medicine

## 2022-11-07 ENCOUNTER — Telehealth: Payer: Self-pay

## 2022-11-07 MED ORDER — ALPRAZOLAM 0.25 MG PO TABS: 0.2500 mg | ORAL_TABLET | Freq: Two times a day (BID) | ORAL | 0 refills | Status: DC | PRN

## 2022-11-07 NOTE — Telephone Encounter (Signed)
Patient's daughter [name] called and stated that Courtney [name]'s husband is now in hospice house and close to passing. She is asking if we can send something in for the patient's nerves, she states that she believes she is going to need a low dose medication to help her nerves. She is requesting medication be sent to [facility] on [address]

## 2022-11-08 ENCOUNTER — Telehealth: Payer: Self-pay

## 2022-11-08 ENCOUNTER — Other Ambulatory Visit: Payer: Self-pay

## 2022-11-08 NOTE — Telephone Encounter (Signed)
PA submitted and approved via covermymeds for xanax. [facility]

## 2022-11-08 NOTE — Telephone Encounter (Signed)
Patients daughter informed

## 2022-11-14 DIAGNOSIS — R2241 Localized swelling, mass and lump, right lower limb: Secondary | ICD-10-CM | POA: Diagnosis not present

## 2022-11-14 DIAGNOSIS — R300 Dysuria: Secondary | ICD-10-CM | POA: Diagnosis not present

## 2022-11-14 DIAGNOSIS — N3091 Cystitis, unspecified with hematuria: Secondary | ICD-10-CM | POA: Diagnosis not present

## 2022-11-15 DIAGNOSIS — H18413 Arcus senilis, bilateral: Secondary | ICD-10-CM | POA: Diagnosis not present

## 2022-11-15 DIAGNOSIS — H43393 Other vitreous opacities, bilateral: Secondary | ICD-10-CM | POA: Diagnosis not present

## 2022-11-15 DIAGNOSIS — B308 Other viral conjunctivitis: Secondary | ICD-10-CM | POA: Diagnosis not present

## 2022-11-15 DIAGNOSIS — H209 Unspecified iridocyclitis: Secondary | ICD-10-CM | POA: Diagnosis not present

## 2022-11-15 DIAGNOSIS — Z961 Presence of intraocular lens: Secondary | ICD-10-CM | POA: Diagnosis not present

## 2022-11-15 DIAGNOSIS — D3131 Benign neoplasm of right choroid: Secondary | ICD-10-CM | POA: Diagnosis not present

## 2022-11-21 DIAGNOSIS — D3131 Benign neoplasm of right choroid: Secondary | ICD-10-CM | POA: Diagnosis not present

## 2022-11-21 DIAGNOSIS — H18413 Arcus senilis, bilateral: Secondary | ICD-10-CM | POA: Diagnosis not present

## 2022-11-21 DIAGNOSIS — H43393 Other vitreous opacities, bilateral: Secondary | ICD-10-CM | POA: Diagnosis not present

## 2022-11-21 DIAGNOSIS — H04123 Dry eye syndrome of bilateral lacrimal glands: Secondary | ICD-10-CM | POA: Diagnosis not present

## 2022-11-21 DIAGNOSIS — Z961 Presence of intraocular lens: Secondary | ICD-10-CM | POA: Diagnosis not present

## 2022-11-23 ENCOUNTER — Other Ambulatory Visit: Payer: Self-pay | Admitting: Family Medicine

## 2022-11-23 DIAGNOSIS — R7303 Prediabetes: Secondary | ICD-10-CM

## 2022-11-24 ENCOUNTER — Other Ambulatory Visit: Payer: Self-pay | Admitting: Family Medicine

## 2022-11-24 DIAGNOSIS — R7303 Prediabetes: Secondary | ICD-10-CM

## 2022-11-29 DIAGNOSIS — M47816 Spondylosis without myelopathy or radiculopathy, lumbar region: Secondary | ICD-10-CM | POA: Diagnosis not present

## 2022-12-05 ENCOUNTER — Encounter: Payer: Self-pay | Admitting: Internal Medicine

## 2022-12-05 ENCOUNTER — Ambulatory Visit: Payer: Medicare Other | Admitting: Internal Medicine

## 2022-12-05 ENCOUNTER — Telehealth: Payer: Self-pay | Admitting: Family Medicine

## 2022-12-05 VITALS — BP 120/70 | HR 68 | Temp 97.9°F | Resp 16 | Ht 65.0 in | Wt 142.8 lb

## 2022-12-05 DIAGNOSIS — N390 Urinary tract infection, site not specified: Secondary | ICD-10-CM | POA: Insufficient documentation

## 2022-12-05 MED ORDER — CEFTRIAXONE SODIUM 1 G IJ SOLR
1.0000 g | Freq: Once | INTRAMUSCULAR | Status: DC
Start: 2022-12-05 — End: 2022-12-12

## 2022-12-05 MED ORDER — CIPROFLOXACIN HCL 500 MG PO TABS: 500.0000 mg | ORAL_TABLET | Freq: Two times a day (BID) | ORAL | 0 refills | Status: DC

## 2022-12-05 NOTE — Progress Notes (Signed)
Office Visit  Subjective   Patient ID: BRUNETTE BRUCK   DOB: [birth year]   Age: 77 y.o.   MRN: [id number]   Chief Complaint Chief Complaint  Patient presents with   Acute Visit    Possible UTI     History of Present Illness Courtney [name] is a 77 yo female who comes in today with 2 weeks of dysuria and urinary frequency.  She is also having left flank pain as well.  She denies any f/c, abdominal pain, hematuria, n/v, or other problems.  She has tried some OTC AZO which has not helped.     Past Medical History 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 07/25/2014   Followed in Pulmonary clinic/ [facility]/ [facility]    Cardiac dysrhythmia    Chronic idiopathic constipation    Colitis, acute 02/08/2021   Coronary artery disease    Depression, major, recurrent, moderate (HCC)    Headache    Heart murmur    History of bladder infections    History of COVID-19    Hyperlipidemia    Hypertension    Hypothyroidism    Knee pain, bilateral    Melena 02/08/2021   Osteoarthritis    Pneumonia    Primary insomnia    Recovering alcoholic (HCC)    SVT (supraventricular tachycardia)    UTI (urinary tract infection)    Varicose veins    Vitamin B12 deficiency      Allergies Allergies  Allergen Reactions   Levofloxacin Hives, Shortness Of Breath, Swelling and Other (See Comments)    RASH IN MOUTH   (can take IV route)   Augmentin [Amoxicillin-Pot Clavulanate] Nausea Only   Bactrim [Sulfamethoxazole-Trimethoprim] Hives and Itching   Clarithromycin     GI upset Other reaction(s): GI Upset (intolerance) GI upset -can take but does cause mild upset    Lyrica [Pregabalin]    Nexletol [Bempedoic Acid]     Muscle pain   Repatha [Evolocumab]     Legs hurt.   Pt is currently taking repatha   Statins Other (See Comments)    Myalgias   Zetia [Ezetimibe]     Muscle pain      Medications  Current Outpatient Medications:    amLODipine (NORVASC) 5 MG tablet, Take 5 mg by mouth daily., Disp: , Rfl:    D-Mannose 350 MG CAPS, Take 350 mg by mouth daily., Disp: , Rfl:    DULoxetine (CYMBALTA) 30 MG capsule, TAKE 2 CAPSULES(60 MG) BY MOUTH DAILY, Disp: 180 capsule, Rfl: 1   furosemide (LASIX) 20 MG tablet, TAKE 1 TABLET(20 MG) BY MOUTH DAILY, Disp: 90 tablet, Rfl: 0   levothyroxine (SYNTHROID) 75 MCG tablet, TAKE 1 TABLET(75 MCG) BY MOUTH DAILY, Disp: 90 tablet, Rfl: 1   metoprolol tartrate (LOPRESSOR) 50 MG tablet, TAKE 1 TABLET(50 MG) BY MOUTH TWICE DAILY WITH MEALS, Disp: 180 tablet, Rfl: 1   Multiple Vitamins-Minerals (MULTIVITAMIN WITH MINERALS) tablet, Take 1 tablet by mouth daily., Disp: , Rfl:    nitroGLYCERIN (NITROSTAT) 0.4 MG SL tablet, PLACE 1 TABLET UNDER THE TONGUE EVERY 5 MINUTES AS NEEDED FOR CHEST PAIN., Disp: 25 tablet, Rfl: 1   omeprazole (PRILOSEC) 40 MG capsule, Take 1 capsule (40 mg total) by mouth daily as needed (acid reflux)., Disp: 90 capsule, Rfl: 1   REPATHA SURECLICK 140 MG/ML SOAJ, Inject 140 mg into the skin every  14 (fourteen) days., Disp: , Rfl:    Review of Systems Review of Systems  Constitutional:  Negative for chills and fever.  Respiratory:  Negative for shortness of breath.   Cardiovascular:  Negative for chest pain, palpitations and leg swelling.  Gastrointestinal:  Negative for abdominal pain, constipation, diarrhea, nausea and vomiting.  Genitourinary:  Positive for dysuria, flank pain and frequency. Negative for hematuria and urgency.  Musculoskeletal:  Negative for myalgias.  Neurological:  Negative for dizziness, weakness and headaches.       Objective:    Vitals BP 120/70   Pulse 68   Temp 97.9 F (36.6 C)   Resp 16   Ht 5\' 5"  (1.651 m)   Wt 142 lb 12.8 oz (64.8 kg)   SpO2 99%   BMI 23.76 kg/m    Physical Examination Physical Exam Constitutional:      Appearance: Normal appearance. She is not  ill-appearing.  Cardiovascular:     Rate and Rhythm: Normal rate and regular rhythm.     Pulses: Normal pulses.     Heart sounds: No murmur heard.    No friction rub. No gallop.  Pulmonary:     Effort: Pulmonary effort is normal. No respiratory distress.     Breath sounds: No wheezing, rhonchi or rales.  Abdominal:     General: Bowel sounds are normal. There is no distension.     Palpations: Abdomen is soft.     Tenderness: There is no abdominal tenderness.  Musculoskeletal:     Right lower leg: No edema.     Left lower leg: No edema.     Comments: No CV tenderness  Skin:    General: Skin is warm and dry.     Findings: No rash.  Neurological:     Mental Status: She is alert.        Assessment & Plan:   Urinary tract infection without hematuria She has a UTI by her UA.  We will send for culture.  I will give her rocephin 1g IM x 1 and start her on cipro per her request.  She states she cannot take levaquin but has never had problems with cipro.    No follow-ups on file.   Crist Fat, MD

## 2022-12-05 NOTE — Assessment & Plan Note (Addendum)
She has a UTI by her UA.  We will send for culture.  I will give her rocephin 1g IM x 1 and start her on cipro per her request.  She states she cannot take levaquin but has never had problems with cipro.

## 2022-12-05 NOTE — Telephone Encounter (Signed)
Pt called today to request a same day appointment for the following symptoms:POSSIBLE UTI.   Unfortunately, our schedule is full and we have no openings between today or tomorrow. Pt was notified that they can wait on a call from our triage or they can do an e-visit through MyChart with a [facility] Provider from home if they have the ability .

## 2022-12-08 DIAGNOSIS — R0602 Shortness of breath: Secondary | ICD-10-CM | POA: Diagnosis not present

## 2022-12-08 DIAGNOSIS — H11429 Conjunctival edema, unspecified eye: Secondary | ICD-10-CM | POA: Diagnosis not present

## 2022-12-08 LAB — URINE CULTURE

## 2022-12-12 ENCOUNTER — Ambulatory Visit (INDEPENDENT_AMBULATORY_CARE_PROVIDER_SITE_OTHER): Payer: Medicare Other | Admitting: Family Medicine

## 2022-12-12 ENCOUNTER — Encounter: Payer: Self-pay | Admitting: Family Medicine

## 2022-12-12 VITALS — BP 112/60 | HR 74 | Temp 97.1°F | Ht 65.0 in | Wt 141.0 lb

## 2022-12-12 DIAGNOSIS — R7303 Prediabetes: Secondary | ICD-10-CM | POA: Diagnosis not present

## 2022-12-12 DIAGNOSIS — F411 Generalized anxiety disorder: Secondary | ICD-10-CM

## 2022-12-12 DIAGNOSIS — E782 Mixed hyperlipidemia: Secondary | ICD-10-CM

## 2022-12-12 DIAGNOSIS — M48062 Spinal stenosis, lumbar region with neurogenic claudication: Secondary | ICD-10-CM

## 2022-12-12 DIAGNOSIS — Z1211 Encounter for screening for malignant neoplasm of colon: Secondary | ICD-10-CM | POA: Diagnosis not present

## 2022-12-12 DIAGNOSIS — E038 Other specified hypothyroidism: Secondary | ICD-10-CM | POA: Diagnosis not present

## 2022-12-12 NOTE — Assessment & Plan Note (Signed)
Poorly controlled. Currently on  repatha Continue to work on eating a healthy diet and exercise.  Labs drawn today.

## 2022-12-12 NOTE — Assessment & Plan Note (Signed)
Continue cymbalta 60 mg daily.  

## 2022-12-12 NOTE — Progress Notes (Signed)
Subjective:  Patient ID: Courtney [name], female    DOB: Nov 03, 1945  Age: 77 y.o. MRN: [id number]  Chief Complaint  Patient presents with   Medical Management of Chronic Issues    HPI   Patient is in today for Allergies. States she seen UC on Sat and was given a kenalog injx which has helped some. Just finished cipro and had a shot of rocephin.  HTN: Norvasc 5 mg daily, Metoprolol 50 mg daily, Lasix 20 mg daily  Hypothyroidism: Synthroid 75 mcg daily  Depression: Cymbalta 30 mg daily, Xanax as needed. Spouse passed away 21-Nov-2022.  Cholesterol: Repatha 140 mg every 14 days.     12/12/2022    3:29 PM 07/06/2022    9:45 AM 04/25/2022    8:57 AM 02/08/2022    9:18 AM 11/08/2021    8:46 AM  Depression screen PHQ 2/9  Decreased Interest 3 3 0 0 1  Down, Depressed, Hopeless 3 3 0 0 1  PHQ - 2 Score 6 6 0 0 2  Altered sleeping 3 3 0 2 3  Tired, decreased energy 3 3 3 3 3   Change in appetite 0 1  0 0  Feeling bad or failure about yourself  0 3 0 2 0  Trouble concentrating 2 3 0 0 0  Moving slowly or fidgety/restless 0 0 0 0 0  Suicidal thoughts 0 0 0 0 0  PHQ-9 Score 14 19 3 7 8   Difficult doing work/chores Not difficult at all Very difficult Not difficult at all  Somewhat difficult        12/12/2022    3:29 PM  Fall Risk   Falls in the past year? 0  Number falls in past yr: 0  Injury with Fall? 0  Risk for fall due to : No Fall Risks  Follow up Falls evaluation completed    Patient Care Team: Blane Ohara, MD as PCP - General (Internal Medicine) Corky Crafts, MD as PCP - Cardiology (Cardiology) McGukin, Luvenia Redden., MD (Cardiology) Chilton Greathouse, MD as Consulting Physician (Pulmonary Disease) Zettie Pho, [facility] (Inactive) (Pharmacist)   Review of Systems  Constitutional:  Positive for fatigue. Negative for chills and fever.  HENT:  Negative for congestion, ear pain, rhinorrhea and sore throat.   Respiratory:  Negative for cough and shortness of breath.    Cardiovascular:  Negative for chest pain.  Gastrointestinal:  Negative for abdominal pain, constipation, diarrhea, nausea and vomiting.  Genitourinary:  Negative for dysuria and urgency.  Musculoskeletal:  Positive for arthralgias and back pain. Negative for myalgias.  Neurological:  Negative for dizziness, weakness, light-headedness and headaches.  Psychiatric/Behavioral:  Negative for dysphoric mood. The patient is not nervous/anxious.     Current Outpatient Medications on File Prior to Visit  Medication Sig Dispense Refill   tirzepatide (MOUNJARO) 10 MG/0.5ML Pen Inject 10 mg into the skin once a week.     amLODipine (NORVASC) 5 MG tablet Take 5 mg by mouth daily.     D-Mannose 350 MG CAPS Take 350 mg by mouth daily.     DULoxetine (CYMBALTA) 30 MG capsule TAKE 2 CAPSULES(60 MG) BY MOUTH DAILY 180 capsule 1   furosemide (LASIX) 20 MG tablet TAKE 1 TABLET(20 MG) BY MOUTH DAILY 90 tablet 0   levothyroxine (SYNTHROID) 75 MCG tablet TAKE 1 TABLET(75 MCG) BY MOUTH DAILY 90 tablet 1   metoprolol tartrate (LOPRESSOR) 50 MG tablet TAKE 1 TABLET(50 MG) BY MOUTH TWICE DAILY WITH MEALS 180  tablet 1   Multiple Vitamins-Minerals (MULTIVITAMIN WITH MINERALS) tablet Take 1 tablet by mouth daily.     nitroGLYCERIN (NITROSTAT) 0.4 MG SL tablet PLACE 1 TABLET UNDER THE TONGUE EVERY 5 MINUTES AS NEEDED FOR CHEST PAIN. 25 tablet 1   REPATHA SURECLICK 140 MG/ML SOAJ Inject 140 mg into the skin every 14 (fourteen) days.     No current facility-administered medications on file prior to visit.   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 07/25/2014   Followed in Pulmonary clinic/ [facility]/ [facility]    Cardiac dysrhythmia    Chronic idiopathic constipation    Colitis, acute 02/08/2021   Coronary artery disease    Depression, major, recurrent, moderate (HCC)    Headache    Heart murmur    History  of bladder infections    History of COVID-19    Hyperlipidemia    Hypertension    Hypothyroidism    Knee pain, bilateral    Melena 02/08/2021   Osteoarthritis    Pneumonia    Primary insomnia    Recovering alcoholic (HCC)    SVT (supraventricular tachycardia)    UTI (urinary tract infection)    Varicose veins    Vitamin B12 deficiency    Past Surgical History:  Procedure Laterality Date   ABDOMINAL HYSTERECTOMY  1991   APPENDECTOMY     BACK SURGERY     had 2 surgeries. Have rods placed in back    BREAST BIOPSY Left    x2   BREAST ENHANCEMENT SURGERY  2006   CARDIAC CATHETERIZATION  12/22/2020   stents placed   CHOLECYSTECTOMY  12/2019   removed lap band.    COLONOSCOPY  12/01/2016   Moderate predominantly sigmod diverticulosis. Otherwise grossly normal colonoscopy   EYE SURGERY     IOL- bilateral - [place]   KNEE ARTHROSCOPY Left    lap band surgery  1981   LUMBAR FUSION  07/29/2015   posterior level one   removal of cervical disc fragments  10/2007   pt. denies    TUBAL LIGATION  1970    Family History  Problem Relation Age of Onset   Heart disease Mother    Heart disease Father    Diabetes Sister    Other Sister        BRAIN TUMOR   Heart disease Brother    Heart disease Brother    Heart disease Brother    Heart disease Brother    Heart disease Brother    Colon cancer Maternal Aunt    Colon cancer Cousin    Social History   Socioeconomic History   Marital status: Married    Spouse name: Not on file   Number of children: 2   Years of education: Not on file   Highest education level: Not on file  Occupational History   Occupation: retired    Comment: Engineer, civil (consulting)  Tobacco Use   Smoking status: Never   Smokeless tobacco: Never  Building services engineer Use: Never used  Substance and Sexual Activity   Alcohol use: No    Alcohol/week: 0.0 standard drinks of alcohol    Comment: recovery x 20 yrs   Drug use: No   Sexual activity: Not Currently     Comment: MARRIED  Other Topics Concern   Not on file  Social History Narrative   Not on  file   Social Determinants of Health   Financial Resource Strain: Low Risk  (12/12/2022)   Overall Financial Resource Strain (CARDIA)    Difficulty of Paying Living Expenses: Not hard at all  Food Insecurity: No Food Insecurity (08/01/2022)   Hunger Vital Sign    Worried About Running Out of Food in the Last Year: Never true    Ran Out of Food in the Last Year: Never true  Transportation Needs: No Transportation Needs (08/01/2022)   PRAPARE - Administrator, Civil Service (Medical): No    Lack of Transportation (Non-Medical): No  Physical Activity: Inactive (12/12/2022)   Exercise Vital Sign    Days of Exercise per Week: 0 days    Minutes of Exercise per Session: 0 min  Stress: No Stress Concern Present (12/12/2022)   [employer] of Occupational Health - Occupational Stress Questionnaire    Feeling of Stress : Not at all  Social Connections: Moderately Isolated (12/12/2022)   Social Connection and Isolation Panel [NHANES]    Frequency of Communication with Friends and Family: Three times a week    Frequency of Social Gatherings with Friends and Family: Three times a week    Attends Religious Services: More than 4 times per year    Active Member of Clubs or Organizations: No    Attends Banker Meetings: Never    Marital Status: Widowed    Objective:  BP 112/60   Pulse 74   Temp (!) 97.1 F (36.2 C)   Ht 5\' 5"  (1.651 m)   Wt 141 lb (64 kg)   SpO2 99%   BMI 23.46 kg/m      12/15/2022    8:38 AM 12/12/2022    3:21 PM 12/05/2022   10:27 AM  BP/Weight  Systolic BP 128 112 120  Diastolic BP 68 60 70  Wt. (Lbs) 142 141 142.8  BMI 23.63 kg/m2 23.46 kg/m2 23.76 kg/m2    Physical Exam Vitals reviewed.  Constitutional:      Appearance: Normal appearance. She is normal weight.  Neck:     Vascular: No carotid bruit.  Cardiovascular:     Rate and Rhythm:  Normal rate and regular rhythm.     Heart sounds: Normal heart sounds.  Pulmonary:     Effort: Pulmonary effort is normal. No respiratory distress.     Breath sounds: Normal breath sounds.  Abdominal:     General: Abdomen is flat. Bowel sounds are normal.     Palpations: Abdomen is soft.     Tenderness: There is no abdominal tenderness.  Neurological:     Mental Status: She is alert and oriented to person, place, and time.  Psychiatric:        Mood and Affect: Mood normal.        Behavior: Behavior normal.     Diabetic Foot Exam - Simple   No data filed      Lab Results  Component Value Date   WBC 7.5 12/12/2022   HGB 11.5 12/12/2022   HCT 35.3 12/12/2022   PLT 256 12/12/2022   GLUCOSE 79 12/12/2022   CHOL 210 (H) 12/12/2022   TRIG 181 (H) 12/12/2022   HDL 56 12/12/2022   LDLCALC 122 (H) 12/12/2022   ALT 17 12/12/2022   AST 20 12/12/2022   NA 138 12/12/2022   K 4.6 12/12/2022   CL 100 12/12/2022   CREATININE 1.02 (H) 12/12/2022   BUN 21 12/12/2022   CO2 26 12/12/2022  TSH 3.540 12/12/2022   HGBA1C 5.6 12/12/2022      Assessment & Plan:    Mixed hyperlipidemia Assessment & Plan: Poorly controlled. Currently on  repatha Continue to work on eating a healthy diet and exercise.  Labs drawn today.    Orders: -     Lipid panel  Prediabetes Assessment & Plan: Continue mounjaro 10 mg weekly.  Continue to eat healthy.  Orders: -     CBC with Differential/Platelet -     Comprehensive metabolic panel -     Hemoglobin A1c  GAD (generalized anxiety disorder) Assessment & Plan: Continue cymbalta 60 mg daily.    Spinal stenosis of lumbar region with neurogenic claudication Assessment & Plan: Stable   Screening for colon cancer -     Cologuard  Secondary hypothyroidism Assessment & Plan: Previously well controlled Continue Synthroid at current dose  Recheck TSH and adjust Synthroid as indicated    Orders: -     TSH -     T4, free  Other  orders -     [facility] CKD [facility]     No orders of the defined types were placed in this encounter.   Orders Placed This Encounter  Procedures   Cologuard   CBC with Differential/Platelet   Comprehensive metabolic panel   Lipid panel   TSH   Hemoglobin A1c   T4, free   [facility] CKD [facility]     Follow-up: Return in about 3 months (around 03/14/2023) for chronic, fasting.   I,Katherina A Bramblett,acting as a scribe for Blane Ohara, MD.,have documented all relevant documentation on the behalf of Blane Ohara, MD,as directed by  Blane Ohara, MD while in the presence of Blane Ohara, MD.    Clayborn Bigness I Leal-Borjas,acting as a scribe for Blane Ohara, MD.,have documented all relevant documentation on the behalf of Blane Ohara, MD,as directed by  Blane Ohara, MD while in the presence of Blane Ohara, MD.   An After Visit Summary was printed and given to the patient.  Blane Ohara, MD [facility] [phone]

## 2022-12-12 NOTE — Assessment & Plan Note (Signed)
Previously well controlled Continue Synthroid at current dose  Recheck TSH and adjust Synthroid as indicated   

## 2022-12-12 NOTE — Assessment & Plan Note (Signed)
Continue mounjaro 10 mg weekly.  Continue to eat healthy.

## 2022-12-13 LAB — COMPREHENSIVE METABOLIC PANEL
ALT: 17 IU/L (ref 0–32)
AST: 20 IU/L (ref 0–40)
Albumin: 4.4 g/dL (ref 3.8–4.8)
Alkaline Phosphatase: 165 IU/L — ABNORMAL HIGH (ref 44–121)
BUN/Creatinine Ratio: 21 (ref 12–28)
BUN: 21 mg/dL (ref 8–27)
Bilirubin Total: <0.2 mg/dL (ref 0.0–1.2)
CO2: 26 mmol/L (ref 20–29)
Calcium: 9.7 mg/dL (ref 8.7–10.3)
Chloride: 100 mmol/L (ref 96–106)
Creatinine, Ser: 1.02 mg/dL — ABNORMAL HIGH (ref 0.57–1.00)
Globulin, Total: 2.7 g/dL (ref 1.5–4.5)
Glucose: 79 mg/dL (ref 70–99)
Potassium: 4.6 mmol/L (ref 3.5–5.2)
Sodium: 138 mmol/L (ref 134–144)
Total Protein: 7.1 g/dL (ref 6.0–8.5)
eGFR: 57 mL/min/{1.73_m2} — ABNORMAL LOW (ref >59)

## 2022-12-13 LAB — CBC WITH DIFFERENTIAL/PLATELET
Basophils Absolute: 0.1 10*3/uL (ref 0.0–0.2)
Basos: 1 %
EOS (ABSOLUTE): 1.4 10*3/uL — ABNORMAL HIGH (ref 0.0–0.4)
Eos: 18 %
Hematocrit: 35.3 % (ref 34.0–46.6)
Hemoglobin: 11.5 g/dL (ref 11.1–15.9)
Immature Grans (Abs): 0 10*3/uL (ref 0.0–0.1)
Immature Granulocytes: 0 %
Lymphocytes Absolute: 2.4 10*3/uL (ref 0.7–3.1)
Lymphs: 32 %
MCH: 29 pg (ref 26.6–33.0)
MCHC: 32.6 g/dL (ref 31.5–35.7)
MCV: 89 fL (ref 79–97)
Monocytes Absolute: 0.6 10*3/uL (ref 0.1–0.9)
Monocytes: 9 %
Neutrophils Absolute: 3 10*3/uL (ref 1.4–7.0)
Neutrophils: 40 %
Platelets: 256 10*3/uL (ref 150–450)
RBC: 3.96 x10E6/uL (ref 3.77–5.28)
RDW: 14.4 % (ref 11.7–15.4)
WBC: 7.5 10*3/uL (ref 3.4–10.8)

## 2022-12-13 LAB — LIPID PANEL
Chol/HDL Ratio: 3.8 ratio (ref 0.0–4.4)
Cholesterol, Total: 210 mg/dL — ABNORMAL HIGH (ref 100–199)
HDL: 56 mg/dL (ref >39)
LDL Chol Calc (NIH): 122 mg/dL — ABNORMAL HIGH (ref 0–99)
Triglycerides: 181 mg/dL — ABNORMAL HIGH (ref 0–149)
VLDL Cholesterol Cal: 32 mg/dL (ref 5–40)

## 2022-12-13 LAB — TSH: TSH: 3.54 u[IU]/mL (ref 0.450–4.500)

## 2022-12-13 LAB — HEMOGLOBIN A1C
Est. average glucose Bld gHb Est-mCnc: 114 mg/dL
Hgb A1c MFr Bld: 5.6 % (ref 4.8–5.6)

## 2022-12-13 LAB — T4, FREE: Free T4: 1.22 ng/dL (ref 0.82–1.77)

## 2022-12-13 NOTE — Assessment & Plan Note (Signed)
Stable

## 2022-12-14 ENCOUNTER — Telehealth: Payer: Self-pay

## 2022-12-14 ENCOUNTER — Encounter: Payer: Self-pay | Admitting: Family Medicine

## 2022-12-14 NOTE — Telephone Encounter (Signed)
Patient called and left voicemail stating that she felt like she was still having UTI symptoms and said that she doesn't think her UTI is gone. She is asking if she can bring by a urine and drop it off for [place] to check because she is concerned. Please advise.

## 2022-12-15 ENCOUNTER — Encounter: Payer: Self-pay | Admitting: Family Medicine

## 2022-12-15 ENCOUNTER — Ambulatory Visit (INDEPENDENT_AMBULATORY_CARE_PROVIDER_SITE_OTHER): Payer: Medicare Other | Admitting: Family Medicine

## 2022-12-15 VITALS — BP 128/68 | HR 82 | Temp 97.7°F | Ht 65.0 in | Wt 142.0 lb

## 2022-12-15 DIAGNOSIS — R109 Unspecified abdominal pain: Secondary | ICD-10-CM | POA: Insufficient documentation

## 2022-12-15 DIAGNOSIS — K5792 Diverticulitis of intestine, part unspecified, without perforation or abscess without bleeding: Secondary | ICD-10-CM | POA: Diagnosis not present

## 2022-12-15 LAB — POCT URINALYSIS DIPSTICK
Bilirubin, UA: NEGATIVE
Blood, UA: NEGATIVE
Glucose, UA: NEGATIVE
Ketones, UA: NEGATIVE
Leukocytes, UA: NEGATIVE
Nitrite, UA: NEGATIVE
Protein, UA: NEGATIVE
Spec Grav, UA: 1.01 (ref 1.010–1.025)
Urobilinogen, UA: NEGATIVE E.U./dL — AB
pH, UA: 7 (ref 5.0–8.0)

## 2022-12-15 MED ORDER — CIPROFLOXACIN HCL 250 MG PO TABS
250.0000 mg | ORAL_TABLET | Freq: Two times a day (BID) | ORAL | 0 refills | Status: AC
Start: 2022-12-15 — End: 2022-12-22

## 2022-12-15 MED ORDER — METRONIDAZOLE 500 MG PO TABS
500.0000 mg | ORAL_TABLET | Freq: Two times a day (BID) | ORAL | 0 refills | Status: AC
Start: 2022-12-15 — End: 2022-12-22

## 2022-12-15 NOTE — Assessment & Plan Note (Signed)
Likely radiation from diverticulitis.  See above.

## 2022-12-15 NOTE — Progress Notes (Signed)
Acute Office Visit  Subjective:    Patient ID: Courtney [name], female    DOB: 1945-08-04, 77 y.o.   MRN: [id number]   HPI: Patient reports:  Urinary Tract Infection  Associated symptoms include flank pain (Left side low back pain) and nausea. Pertinent negatives include no hematuria or vomiting.   Has history of diverticulitis.  Strong odor to urine. Pain 8/10.  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 07/25/2014   Followed in Pulmonary clinic/ [facility]/ [facility]    Cardiac dysrhythmia    Chronic idiopathic constipation    Colitis, acute 02/08/2021   Coronary artery disease    Depression, major, recurrent, moderate (HCC)    Headache    Heart murmur    History of bladder infections    History of COVID-19    Hyperlipidemia    Hypertension    Hypothyroidism    Knee pain, bilateral    Melena 02/08/2021   Osteoarthritis    Pneumonia    Primary insomnia    Recovering alcoholic (HCC)    SVT (supraventricular tachycardia)    UTI (urinary tract infection)    Varicose veins    Vitamin B12 deficiency     Past Surgical History:  Procedure Laterality Date   ABDOMINAL HYSTERECTOMY  1991   APPENDECTOMY     BACK SURGERY     had 2 surgeries. Have rods placed in back    BREAST BIOPSY Left    x2   BREAST ENHANCEMENT SURGERY  2006   CARDIAC CATHETERIZATION  12/22/2020   stents placed   CHOLECYSTECTOMY  12/2019   removed lap band.    COLONOSCOPY  12/01/2016   Moderate predominantly sigmod diverticulosis. Otherwise grossly normal colonoscopy   EYE SURGERY     IOL- bilateral - [place]   KNEE ARTHROSCOPY Left    lap band surgery  1981   LUMBAR FUSION  07/29/2015   posterior level one   removal of cervical disc fragments  10/2007   pt. denies    TUBAL LIGATION  1970    Family History  Problem Relation Age of Onset   Heart disease Mother    Heart disease  Father    Diabetes Sister    Other Sister        BRAIN TUMOR   Heart disease Brother    Heart disease Brother    Heart disease Brother    Heart disease Brother    Heart disease Brother    Colon cancer Maternal Aunt    Colon cancer Cousin     Social History   Socioeconomic History   Marital status: Married    Spouse name: Not on file   Number of children: 2   Years of education: Not on file   Highest education level: Not on file  Occupational History   Occupation: retired    Comment: Engineer, civil (consulting)  Tobacco Use   Smoking status: Never   Smokeless tobacco: Never  Building services engineer Use: Never used  Substance and Sexual Activity   Alcohol use: No    Alcohol/week: 0.0 standard drinks of alcohol    Comment: recovery x 20 yrs   Drug use: No   Sexual activity: Not Currently    Comment: MARRIED  Other Topics Concern   Not on file  Social History Narrative   Not on file   Social Determinants  of Health   Financial Resource Strain: Low Risk  (12/12/2022)   Overall Financial Resource Strain (CARDIA)    Difficulty of Paying Living Expenses: Not hard at all  Food Insecurity: No Food Insecurity (08/01/2022)   Hunger Vital Sign    Worried About Running Out of Food in the Last Year: Never true    Ran Out of Food in the Last Year: Never true  Transportation Needs: No Transportation Needs (08/01/2022)   PRAPARE - Administrator, Civil Service (Medical): No    Lack of Transportation (Non-Medical): No  Physical Activity: Inactive (12/12/2022)   Exercise Vital Sign    Days of Exercise per Week: 0 days    Minutes of Exercise per Session: 0 min  Stress: No Stress Concern Present (12/12/2022)   [employer] of Occupational Health - Occupational Stress Questionnaire    Feeling of Stress : Not at all  Social Connections: Moderately Isolated (12/12/2022)   Social Connection and Isolation Panel [NHANES]    Frequency of Communication with Friends and Family: Three times a week     Frequency of Social Gatherings with Friends and Family: Three times a week    Attends Religious Services: More than 4 times per year    Active Member of Clubs or Organizations: No    Attends Banker Meetings: Never    Marital Status: Widowed  Intimate Partner Violence: Not At Risk (12/12/2022)   Humiliation, Afraid, Rape, and Kick questionnaire    Fear of Current or Ex-Partner: No    Emotionally Abused: No    Physically Abused: No    Sexually Abused: No    Outpatient Medications Prior to Visit  Medication Sig Dispense Refill   amLODipine (NORVASC) 5 MG tablet Take 5 mg by mouth daily.     D-Mannose 350 MG CAPS Take 350 mg by mouth daily.     DULoxetine (CYMBALTA) 30 MG capsule TAKE 2 CAPSULES(60 MG) BY MOUTH DAILY 180 capsule 1   furosemide (LASIX) 20 MG tablet TAKE 1 TABLET(20 MG) BY MOUTH DAILY 90 tablet 0   levothyroxine (SYNTHROID) 75 MCG tablet TAKE 1 TABLET(75 MCG) BY MOUTH DAILY 90 tablet 1   metoprolol tartrate (LOPRESSOR) 50 MG tablet TAKE 1 TABLET(50 MG) BY MOUTH TWICE DAILY WITH MEALS 180 tablet 1   Multiple Vitamins-Minerals (MULTIVITAMIN WITH MINERALS) tablet Take 1 tablet by mouth daily.     nitroGLYCERIN (NITROSTAT) 0.4 MG SL tablet PLACE 1 TABLET UNDER THE TONGUE EVERY 5 MINUTES AS NEEDED FOR CHEST PAIN. 25 tablet 1   REPATHA SURECLICK 140 MG/ML SOAJ Inject 140 mg into the skin every 14 (fourteen) days.     tirzepatide (MOUNJARO) 10 MG/0.5ML Pen Inject 10 mg into the skin once a week.     No facility-administered medications prior to visit.    Allergies  Allergen Reactions   Levofloxacin Hives, Shortness Of Breath, Swelling and Other (See Comments)    RASH IN MOUTH   (can take IV route)   Augmentin [Amoxicillin-Pot Clavulanate] Nausea Only   Bactrim [Sulfamethoxazole-Trimethoprim] Hives and Itching   Clarithromycin     GI upset Other reaction(s): GI Upset (intolerance) GI upset -can take but does cause mild upset    Lyrica [Pregabalin]    Nexletol  [Bempedoic Acid]     Muscle pain   Repatha [Evolocumab]     Legs hurt.   Pt is currently taking repatha   Statins Other (See Comments)    Myalgias   Zetia [Ezetimibe]  Muscle pain    Review of Systems  Constitutional:  Negative for appetite change, fatigue and fever.  HENT:  Negative for congestion, ear pain, sinus pressure and sore throat.   Respiratory:  Negative for cough, chest tightness, shortness of breath and wheezing.   Cardiovascular:  Negative for chest pain and palpitations.  Gastrointestinal:  Positive for abdominal pain (lower) and nausea. Negative for constipation, diarrhea and vomiting.  Genitourinary:  Positive for flank pain (Left side low back pain). Negative for dysuria and hematuria.  Musculoskeletal:  Negative for arthralgias, back pain, joint swelling and myalgias.  Skin:  Negative for rash.  Neurological:  Negative for dizziness, weakness and headaches.  Psychiatric/Behavioral:  Negative for dysphoric mood. The patient is not nervous/anxious.        Objective:    Physical Exam Vitals reviewed.  Constitutional:      Appearance: Normal appearance. She is normal weight.  Cardiovascular:     Rate and Rhythm: Normal rate and regular rhythm.     Heart sounds: Normal heart sounds.  Pulmonary:     Effort: Pulmonary effort is normal.     Breath sounds: Normal breath sounds.  Abdominal:     General: Abdomen is flat. Bowel sounds are normal.     Palpations: Abdomen is soft.     Tenderness: There is abdominal tenderness (LLQ/Left flank).  Neurological:     Mental Status: She is alert and oriented to person, place, and time.  Psychiatric:        Mood and Affect: Mood normal.        Behavior: Behavior normal.     BP 128/68 (BP Location: Left Arm, Patient Position: Sitting)   Pulse 82   Temp 97.7 F (36.5 C) (Temporal)   Ht 5\' 5"  (1.651 m)   Wt 142 lb (64.4 kg)   SpO2 99%   BMI 23.63 kg/m  Wt Readings from Last 3 Encounters:  12/15/22 142 lb  (64.4 kg)  12/12/22 141 lb (64 kg)  12/05/22 142 lb 12.8 oz (64.8 kg)    Health Maintenance Due  Topic Date Due   DTaP/Tdap/Td (1 - Tdap) Never done   Zoster Vaccines- Shingrix (1 of 2) Never done   Colonoscopy  12/01/2021    There are no preventive care reminders to display for this patient.   Lab Results  Component Value Date   TSH 3.540 12/12/2022   Lab Results  Component Value Date   WBC 7.5 12/12/2022   HGB 11.5 12/12/2022   HCT 35.3 12/12/2022   MCV 89 12/12/2022   PLT 256 12/12/2022   Lab Results  Component Value Date   NA 138 12/12/2022   K 4.6 12/12/2022   CO2 26 12/12/2022   GLUCOSE 79 12/12/2022   BUN 21 12/12/2022   CREATININE 1.02 (H) 12/12/2022   BILITOT <0.2 12/12/2022   ALKPHOS 165 (H) 12/12/2022   AST 20 12/12/2022   ALT 17 12/12/2022   PROT 7.1 12/12/2022   ALBUMIN 4.4 12/12/2022   CALCIUM 9.7 12/12/2022   ANIONGAP 9 07/20/2022   EGFR 57 (L) 12/12/2022   Lab Results  Component Value Date   CHOL 210 (H) 12/12/2022   Lab Results  Component Value Date   HDL 56 12/12/2022   Lab Results  Component Value Date   LDLCALC 122 (H) 12/12/2022   Lab Results  Component Value Date   TRIG 181 (H) 12/12/2022   Lab Results  Component Value Date   CHOLHDL 3.8 12/12/2022   Lab  Results  Component Value Date   HGBA1C 5.6 12/12/2022         Assessment & Plan:   Diverticulitis Assessment & Plan: Patient prescribed Cipro 500 mg twice daily x 1 week and Flagyl 500 mg twice daily x 1 week.  Patient instructed to go to the emergency department/med center over the weekend if her pain is not improving and certainly if it is getting worse. Urine was normal   Left flank pain Assessment & Plan: Likely radiation from diverticulitis.  See above.  Orders: -     POCT urinalysis dipstick  Other orders -     Ciprofloxacin HCl; Take 1 tablet (250 mg total) by mouth 2 (two) times daily for 7 days.  Dispense: 14 tablet; Refill: 0 -     metroNIDAZOLE;  Take 1 tablet (500 mg total) by mouth 2 (two) times daily for 7 days.  Dispense: 14 tablet; Refill: 0     Meds ordered this encounter  Medications   ciprofloxacin (CIPRO) 250 MG tablet    Sig: Take 1 tablet (250 mg total) by mouth 2 (two) times daily for 7 days.    Dispense:  14 tablet    Refill:  0    Patient is able to take cipro even though has allergy to levaquin.   metroNIDAZOLE (FLAGYL) 500 MG tablet    Sig: Take 1 tablet (500 mg total) by mouth 2 (two) times daily for 7 days.    Dispense:  14 tablet    Refill:  0    I,Lauren M Auman,acting as a scribe for Blane Ohara, MD.,have documented all relevant documentation on the behalf of Blane Ohara, MD,as directed by  Blane Ohara, MD while in the presence of Blane Ohara, MD.   Blane Ohara, MD

## 2022-12-15 NOTE — Assessment & Plan Note (Signed)
Patient prescribed Cipro 500 mg twice daily x 1 week and Flagyl 500 mg twice daily x 1 week.  Patient instructed to go to the emergency department/med center over the weekend if her pain is not improving and certainly if it is getting worse. Urine was normal

## 2022-12-26 ENCOUNTER — Other Ambulatory Visit: Payer: Self-pay | Admitting: Family Medicine

## 2022-12-26 DIAGNOSIS — E663 Overweight: Secondary | ICD-10-CM

## 2022-12-26 DIAGNOSIS — R7303 Prediabetes: Secondary | ICD-10-CM

## 2022-12-26 DIAGNOSIS — R600 Localized edema: Secondary | ICD-10-CM

## 2022-12-28 DIAGNOSIS — Z9889 Other specified postprocedural states: Secondary | ICD-10-CM | POA: Diagnosis not present

## 2022-12-28 DIAGNOSIS — M461 Sacroiliitis, not elsewhere classified: Secondary | ICD-10-CM | POA: Diagnosis not present

## 2022-12-28 DIAGNOSIS — M47816 Spondylosis without myelopathy or radiculopathy, lumbar region: Secondary | ICD-10-CM | POA: Diagnosis not present

## 2023-01-02 DIAGNOSIS — I2510 Atherosclerotic heart disease of native coronary artery without angina pectoris: Secondary | ICD-10-CM | POA: Diagnosis not present

## 2023-01-02 DIAGNOSIS — Z9861 Coronary angioplasty status: Secondary | ICD-10-CM | POA: Diagnosis not present

## 2023-01-02 DIAGNOSIS — I10 Essential (primary) hypertension: Secondary | ICD-10-CM | POA: Diagnosis not present

## 2023-01-02 DIAGNOSIS — E7800 Pure hypercholesterolemia, unspecified: Secondary | ICD-10-CM | POA: Diagnosis not present

## 2023-01-06 DIAGNOSIS — M461 Sacroiliitis, not elsewhere classified: Secondary | ICD-10-CM | POA: Diagnosis not present

## 2023-01-09 DIAGNOSIS — D3131 Benign neoplasm of right choroid: Secondary | ICD-10-CM | POA: Diagnosis not present

## 2023-01-09 DIAGNOSIS — H43393 Other vitreous opacities, bilateral: Secondary | ICD-10-CM | POA: Diagnosis not present

## 2023-01-09 DIAGNOSIS — H532 Diplopia: Secondary | ICD-10-CM | POA: Diagnosis not present

## 2023-01-09 DIAGNOSIS — H04123 Dry eye syndrome of bilateral lacrimal glands: Secondary | ICD-10-CM | POA: Diagnosis not present

## 2023-01-09 DIAGNOSIS — H18413 Arcus senilis, bilateral: Secondary | ICD-10-CM | POA: Diagnosis not present

## 2023-01-09 DIAGNOSIS — Z961 Presence of intraocular lens: Secondary | ICD-10-CM | POA: Diagnosis not present

## 2023-01-19 ENCOUNTER — Telehealth: Payer: Self-pay

## 2023-01-19 ENCOUNTER — Telehealth (INDEPENDENT_AMBULATORY_CARE_PROVIDER_SITE_OTHER): Payer: Medicare Other | Admitting: Physician Assistant

## 2023-01-19 VITALS — BP 120/68 | Temp 99.6°F

## 2023-01-19 DIAGNOSIS — J479 Bronchiectasis, uncomplicated: Secondary | ICD-10-CM | POA: Diagnosis not present

## 2023-01-19 MED ORDER — CIPROFLOXACIN HCL 500 MG PO TABS
500.0000 mg | ORAL_TABLET | Freq: Two times a day (BID) | ORAL | 0 refills | Status: AC
Start: 2023-01-19 — End: 2023-01-29

## 2023-01-19 MED ORDER — PREDNISONE 20 MG PO TABS
ORAL_TABLET | ORAL | 0 refills | Status: AC
Start: 2023-01-19 — End: 2023-01-27

## 2023-01-19 NOTE — Telephone Encounter (Signed)
Courtney [name] called requesting an antibiotic.  She is complaining of congestion in the left lower lobe.  She is heading out of town tomorrow and she wanted to get something before leaving.

## 2023-01-19 NOTE — Progress Notes (Unsigned)
Virtual Visit via Telephone Note   This visit type was conducted secondary to patient preference OR due to national recommendations for restrictions regarding the COVID-19 Pandemic (e.g. social distancing) in an effort to limit this patient's exposure and mitigate transmission in our community.  Due to her co-morbid illnesses, this patient is at least at moderate risk for complications without adequate follow up.  This format is felt to be most appropriate for this patient at this time.  All issues noted in this document were discussed and addressed.  No physical exam could be performed with this format.  Patient verbally consented to a telehealth visit.   Date:  01/26/2023   ID:  Courtney [name], DOB 1945/10/07, MRN [id number]  Patient Location: Home Provider Location: Office/Clinic  PCP:  Blane Ohara, MD   History of Present Illness:    Chief Complaint:  Cough  Courtney [name] is a 77 y.o. female and took covid test Monday. Symptoms started Sunday. Denies chills, fever, shob, does have a dry cough. States she knows she has bronchiectasis. Leaving for vacation tomorrow morning.  The patient does have symptoms concerning for COVID-19 infection (fever, chills, cough, or new shortness of breath).    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 07/25/2014   Followed in Pulmonary clinic/ [facility]/ [facility]    Cardiac dysrhythmia    Chronic idiopathic constipation    Colitis, acute 02/08/2021   Coronary artery disease    Depression, major, recurrent, moderate (HCC)    Headache    Heart murmur    History of bladder infections    History of COVID-19    Hyperlipidemia    Hypertension    Hypothyroidism    Knee pain, bilateral    Melena 02/08/2021   Osteoarthritis    Pneumonia    Primary insomnia    Recovering alcoholic (HCC)    SVT (supraventricular tachycardia)    UTI  (urinary tract infection)    Varicose veins    Vitamin B12 deficiency     Past Surgical History:  Procedure Laterality Date   ABDOMINAL HYSTERECTOMY  1991   APPENDECTOMY     BACK SURGERY     had 2 surgeries. Have rods placed in back    BREAST BIOPSY Left    x2   BREAST ENHANCEMENT SURGERY  2006   CARDIAC CATHETERIZATION  12/22/2020   stents placed   CHOLECYSTECTOMY  12/2019   removed lap band.    COLONOSCOPY  12/01/2016   Moderate predominantly sigmod diverticulosis. Otherwise grossly normal colonoscopy   EYE SURGERY     IOL- bilateral - [place]   KNEE ARTHROSCOPY Left    lap band surgery  1981   LUMBAR FUSION  07/29/2015   posterior level one   removal of cervical disc fragments  10/2007   pt. denies    TUBAL LIGATION  1970    Family History  Problem Relation Age of Onset   Heart disease Mother    Heart disease Father    Diabetes Sister    Other Sister        BRAIN TUMOR   Heart disease Brother    Heart disease Brother    Heart disease Brother    Heart disease Brother    Heart disease Brother    Colon cancer Maternal Aunt    Colon cancer Cousin  Social History   Socioeconomic History   Marital status: Married    Spouse name: Not on file   Number of children: 2   Years of education: Not on file   Highest education level: Not on file  Occupational History   Occupation: retired    Comment: Engineer, civil (consulting)  Tobacco Use   Smoking status: Never   Smokeless tobacco: Never  Vaping Use   Vaping status: Never Used  Substance and Sexual Activity   Alcohol use: No    Alcohol/week: 0.0 standard drinks of alcohol    Comment: recovery x 20 yrs   Drug use: No   Sexual activity: Not Currently    Comment: MARRIED  Other Topics Concern   Not on file  Social History Narrative   Not on file   Social Determinants of Health   Financial Resource Strain: Low Risk  (12/12/2022)   Overall Financial Resource Strain (CARDIA)    Difficulty of Paying Living Expenses: Not  hard at all  Food Insecurity: No Food Insecurity (08/01/2022)   Hunger Vital Sign    Worried About Running Out of Food in the Last Year: Never true    Ran Out of Food in the Last Year: Never true  Transportation Needs: No Transportation Needs (08/01/2022)   PRAPARE - Administrator, Civil Service (Medical): No    Lack of Transportation (Non-Medical): No  Physical Activity: Inactive (12/12/2022)   Exercise Vital Sign    Days of Exercise per Week: 0 days    Minutes of Exercise per Session: 0 min  Stress: No Stress Concern Present (12/12/2022)   [employer] of Occupational Health - Occupational Stress Questionnaire    Feeling of Stress : Not at all  Social Connections: Moderately Isolated (12/12/2022)   Social Connection and Isolation Panel [NHANES]    Frequency of Communication with Friends and Family: Three times a week    Frequency of Social Gatherings with Friends and Family: Three times a week    Attends Religious Services: More than 4 times per year    Active Member of Clubs or Organizations: No    Attends Banker Meetings: Never    Marital Status: Widowed  Intimate Partner Violence: Not At Risk (12/12/2022)   Humiliation, Afraid, Rape, and Kick questionnaire    Fear of Current or Ex-Partner: No    Emotionally Abused: No    Physically Abused: No    Sexually Abused: No    Outpatient Medications Prior to Visit  Medication Sig Dispense Refill   amLODipine (NORVASC) 5 MG tablet Take 5 mg by mouth daily.     D-Mannose 350 MG CAPS Take 350 mg by mouth daily.     DULoxetine (CYMBALTA) 30 MG capsule TAKE 2 CAPSULES(60 MG) BY MOUTH DAILY 180 capsule 0   furosemide (LASIX) 20 MG tablet TAKE 1 TABLET(20 MG) BY MOUTH DAILY 90 tablet 1   levothyroxine (SYNTHROID) 75 MCG tablet TAKE 1 TABLET(75 MCG) BY MOUTH DAILY 90 tablet 1   metoprolol tartrate (LOPRESSOR) 50 MG tablet TAKE 1 TABLET(50 MG) BY MOUTH TWICE DAILY WITH MEALS 180 tablet 1   Multiple  Vitamins-Minerals (MULTIVITAMIN WITH MINERALS) tablet Take 1 tablet by mouth daily.     nitroGLYCERIN (NITROSTAT) 0.4 MG SL tablet PLACE 1 TABLET UNDER THE TONGUE EVERY 5 MINUTES AS NEEDED FOR CHEST PAIN. 25 tablet 1   REPATHA SURECLICK 140 MG/ML SOAJ Inject 140 mg into the skin every 14 (fourteen) days.     tirzepatide [facility]) 10  MG/0.5ML Pen Inject 10 mg into the skin once a week.     No facility-administered medications prior to visit.    Allergies:   Levofloxacin, Augmentin [amoxicillin-pot clavulanate], Bactrim [sulfamethoxazole-trimethoprim], Clarithromycin, Lyrica [pregabalin], Nexletol [bempedoic acid], Repatha [evolocumab], Statins, and Zetia [ezetimibe]   Social History   Tobacco Use   Smoking status: Never   Smokeless tobacco: Never  Vaping Use   Vaping status: Never Used  Substance Use Topics   Alcohol use: No    Alcohol/week: 0.0 standard drinks of alcohol    Comment: recovery x 20 yrs   Drug use: No     Review of Systems  Constitutional:  Negative for chills, diaphoresis and fever.  HENT:  Positive for congestion. Negative for ear discharge.   Respiratory:  Positive for cough. Negative for shortness of breath.   Cardiovascular:  Negative for chest pain and palpitations.  Gastrointestinal:  Negative for heartburn, nausea and vomiting.  Genitourinary:  Negative for dysuria and flank pain.  Musculoskeletal:  Negative for myalgias.  Skin:  Negative for rash.  Neurological:  Negative for dizziness and headaches.     Labs/Other Tests and Data Reviewed:    Recent Labs: 12/12/2022: ALT 17; BUN 21; Creatinine, Ser 1.02; Hemoglobin 11.5; Platelets 256; Potassium 4.6; Sodium 138; TSH 3.540   Recent Lipid Panel Lab Results  Component Value Date/Time   CHOL 210 (H) 12/12/2022 03:49 PM   TRIG 181 (H) 12/12/2022 03:49 PM   HDL 56 12/12/2022 03:49 PM   CHOLHDL 3.8 12/12/2022 03:49 PM   CHOLHDL 4.2 09/20/2009 11:00 PM   LDLCALC 122 (H) 12/12/2022 03:49 PM    Wt  Readings from Last 3 Encounters:  12/15/22 142 lb (64.4 kg)  12/12/22 141 lb (64 kg)  12/05/22 142 lb 12.8 oz (64.8 kg)     Objective:    Vital Signs:  BP 120/68   Temp 99.6 F (37.6 C)    Physical Exam Constitutional:      Appearance: Normal appearance. She is not ill-appearing.  Neurological:     Mental Status: She is alert.  Psychiatric:        Mood and Affect: Mood normal.        Behavior: Behavior normal. Behavior is cooperative.      ASSESSMENT & PLAN:    Bronchiectasis (HCC) Prescribed Prednisone and Cipro Patient will continue to monitor symptoms Will schedule appointment if still symptomatic    No orders of the defined types were placed in this encounter.    Meds ordered this encounter  Medications   ciprofloxacin (CIPRO) 500 MG tablet    Sig: Take 1 tablet (500 mg total) by mouth 2 (two) times daily for 10 days.    Dispense:  20 tablet    Refill:  0   predniSONE (DELTASONE) 20 MG tablet    Sig: Take 3 tablets (60 mg total) by mouth daily with breakfast for 3 days, THEN 2 tablets (40 mg total) daily with breakfast for 3 days, THEN 1 tablet (20 mg total) daily with breakfast for 3 days.    Dispense:  18 tablet    Refill:  0    I spent 30 minutes dedicated to the care of this patient on the date of this encounter to include face-to-face time with the patient, as well as: chart review  Follow Up:  In Person  As needed  Hartley Barefoot, [place]  01/26/2023 1:43 PM    [name]

## 2023-01-19 NOTE — Telephone Encounter (Signed)
Scheduled for telehealth visit. Dr. Sedalia Muta

## 2023-01-26 ENCOUNTER — Encounter: Payer: Self-pay | Admitting: Physician Assistant

## 2023-01-26 NOTE — Assessment & Plan Note (Signed)
Prescribed Prednisone and Cipro Patient will continue to monitor symptoms Will schedule appointment if still symptomatic

## 2023-01-31 DIAGNOSIS — M5137 Other intervertebral disc degeneration, lumbosacral region: Secondary | ICD-10-CM | POA: Diagnosis not present

## 2023-01-31 DIAGNOSIS — G894 Chronic pain syndrome: Secondary | ICD-10-CM | POA: Diagnosis not present

## 2023-01-31 DIAGNOSIS — M961 Postlaminectomy syndrome, not elsewhere classified: Secondary | ICD-10-CM | POA: Diagnosis not present

## 2023-02-01 DIAGNOSIS — R300 Dysuria: Secondary | ICD-10-CM | POA: Diagnosis not present

## 2023-02-01 DIAGNOSIS — N3091 Cystitis, unspecified with hematuria: Secondary | ICD-10-CM | POA: Diagnosis not present

## 2023-02-02 ENCOUNTER — Other Ambulatory Visit: Payer: Self-pay | Admitting: Family Medicine

## 2023-02-02 DIAGNOSIS — R7303 Prediabetes: Secondary | ICD-10-CM

## 2023-02-07 DIAGNOSIS — Z1211 Encounter for screening for malignant neoplasm of colon: Secondary | ICD-10-CM | POA: Diagnosis not present

## 2023-02-09 DIAGNOSIS — M48062 Spinal stenosis, lumbar region with neurogenic claudication: Secondary | ICD-10-CM | POA: Diagnosis not present

## 2023-02-15 ENCOUNTER — Encounter: Payer: Self-pay | Admitting: Family Medicine

## 2023-02-15 ENCOUNTER — Ambulatory Visit (INDEPENDENT_AMBULATORY_CARE_PROVIDER_SITE_OTHER): Payer: Medicare Other | Admitting: Family Medicine

## 2023-02-15 VITALS — BP 132/74 | HR 89 | Temp 97.2°F | Ht 65.0 in | Wt 148.0 lb

## 2023-02-15 DIAGNOSIS — R051 Acute cough: Secondary | ICD-10-CM | POA: Diagnosis not present

## 2023-02-15 DIAGNOSIS — J0110 Acute frontal sinusitis, unspecified: Secondary | ICD-10-CM | POA: Diagnosis not present

## 2023-02-15 LAB — POC COVID19 BINAXNOW: SARS Coronavirus 2 Ag: NEGATIVE

## 2023-02-15 MED ORDER — AMOXICILLIN-POT CLAVULANATE 875-125 MG PO TABS: 1.0000 | ORAL_TABLET | Freq: Two times a day (BID) | ORAL | 0 refills | Status: DC

## 2023-02-15 NOTE — Assessment & Plan Note (Signed)
Patient reports that she has taken Augmentin before and that she doesn't have the side effects of nausea because she takes probiotics daily.

## 2023-02-15 NOTE — Assessment & Plan Note (Signed)
Covid - negative Increase water intake

## 2023-02-15 NOTE — Progress Notes (Signed)
Acute Office Visit  Subjective:    Patient ID: Courtney [name], female    DOB: 1945-10-16, 77 y.o.   MRN: [id number]  Chief Complaint  Patient presents with   Sinusitis    HPI: Patient is in today for sinus symptoms which started Sunday. C/o facial pain and pressure, headache, sore throat and left ear pain. Has tried Catering manager which have not helped.  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 07/25/2014   Followed in Pulmonary clinic/ [facility]/ [place]    Cardiac dysrhythmia    Chronic idiopathic constipation    Colitis, acute 02/08/2021   Coronary artery disease    Depression, major, recurrent, moderate (HCC)    Headache    Heart murmur    History of bladder infections    History of COVID-19    Hyperlipidemia    Hypertension    Hypothyroidism    Knee pain, bilateral    Melena 02/08/2021   Osteoarthritis    Pneumonia    Primary insomnia    Recovering alcoholic (HCC)    SVT (supraventricular tachycardia)    UTI (urinary tract infection)    Varicose veins    Vitamin B12 deficiency     Past Surgical History:  Procedure Laterality Date   ABDOMINAL HYSTERECTOMY  1991   APPENDECTOMY     BACK SURGERY     had 2 surgeries. Have rods placed in back    BREAST BIOPSY Left    x2   BREAST ENHANCEMENT SURGERY  2006   CARDIAC CATHETERIZATION  12/22/2020   stents placed   CHOLECYSTECTOMY  12/2019   removed lap band.    COLONOSCOPY  12/01/2016   Moderate predominantly sigmod diverticulosis. Otherwise grossly normal colonoscopy   EYE SURGERY     IOL- bilateral - [place]   KNEE ARTHROSCOPY Left    lap band surgery  1981   LUMBAR FUSION  07/29/2015   posterior level one   removal of cervical disc fragments  10/2007   pt. denies    TUBAL LIGATION  1970    Family History  Problem Relation Age of Onset   Heart disease Mother    Heart disease Father     Diabetes Sister    Other Sister        BRAIN TUMOR   Heart disease Brother    Heart disease Brother    Heart disease Brother    Heart disease Brother    Heart disease Brother    Colon cancer Maternal Aunt    Colon cancer Cousin     Social History   Socioeconomic History   Marital status: Married    Spouse name: Not on file   Number of children: 2   Years of education: Not on file   Highest education level: Not on file  Occupational History   Occupation: retired    Comment: Engineer, civil (consulting)  Tobacco Use   Smoking status: Never   Smokeless tobacco: Never  Vaping Use   Vaping status: Never Used  Substance and Sexual Activity   Alcohol use: No    Alcohol/week: 0.0 standard drinks of alcohol    Comment: recovery x 20 yrs   Drug use: No   Sexual activity: Not Currently    Comment: MARRIED  Other Topics Concern   Not on file  Social History Narrative   Not on file  Social Determinants of Health   Financial Resource Strain: Low Risk  (12/12/2022)   Overall Financial Resource Strain (CARDIA)    Difficulty of Paying Living Expenses: Not hard at all  Food Insecurity: No Food Insecurity (08/01/2022)   Hunger Vital Sign    Worried About Running Out of Food in the Last Year: Never true    Ran Out of Food in the Last Year: Never true  Transportation Needs: No Transportation Needs (08/01/2022)   PRAPARE - Administrator, Civil Service (Medical): No    Lack of Transportation (Non-Medical): No  Physical Activity: Inactive (12/12/2022)   Exercise Vital Sign    Days of Exercise per Week: 0 days    Minutes of Exercise per Session: 0 min  Stress: No Stress Concern Present (12/12/2022)   [employer] of Occupational Health - Occupational Stress Questionnaire    Feeling of Stress : Not at all  Social Connections: Moderately Isolated (12/12/2022)   Social Connection and Isolation Panel [NHANES]    Frequency of Communication with Friends and Family: Three times a week     Frequency of Social Gatherings with Friends and Family: Three times a week    Attends Religious Services: More than 4 times per year    Active Member of Clubs or Organizations: No    Attends Banker Meetings: Never    Marital Status: Widowed  Intimate Partner Violence: Not At Risk (12/12/2022)   Humiliation, Afraid, Rape, and Kick questionnaire    Fear of Current or Ex-Partner: No    Emotionally Abused: No    Physically Abused: No    Sexually Abused: No    Outpatient Medications Prior to Visit  Medication Sig Dispense Refill   amLODipine (NORVASC) 5 MG tablet Take 5 mg by mouth daily.     D-Mannose 350 MG CAPS Take 350 mg by mouth daily.     DULoxetine (CYMBALTA) 30 MG capsule TAKE 2 CAPSULES(60 MG) BY MOUTH DAILY 180 capsule 0   furosemide (LASIX) 20 MG tablet TAKE 1 TABLET(20 MG) BY MOUTH DAILY 90 tablet 1   levothyroxine (SYNTHROID) 75 MCG tablet TAKE 1 TABLET(75 MCG) BY MOUTH DAILY 90 tablet 1   metoprolol tartrate (LOPRESSOR) 50 MG tablet TAKE 1 TABLET(50 MG) BY MOUTH TWICE DAILY WITH MEALS 180 tablet 1   Multiple Vitamins-Minerals (MULTIVITAMIN WITH MINERALS) tablet Take 1 tablet by mouth daily.     nitroGLYCERIN (NITROSTAT) 0.4 MG SL tablet PLACE 1 TABLET UNDER THE TONGUE EVERY 5 MINUTES AS NEEDED FOR CHEST PAIN. 25 tablet 1   REPATHA SURECLICK 140 MG/ML SOAJ Inject 140 mg into the skin every 14 (fourteen) days.     tirzepatide (MOUNJARO) 10 MG/0.5ML Pen Inject 10 mg into the skin once a week.     No facility-administered medications prior to visit.    Allergies  Allergen Reactions   Levofloxacin Hives, Shortness Of Breath, Swelling and Other (See Comments)    RASH IN MOUTH   (can take IV route)   Augmentin [Amoxicillin-Pot Clavulanate] Nausea Only   Bactrim [Sulfamethoxazole-Trimethoprim] Hives and Itching   Clarithromycin     GI upset Other reaction(s): GI Upset (intolerance) GI upset -can take but does cause mild upset    Lyrica [Pregabalin]    Nexletol  [Bempedoic Acid]     Muscle pain   Repatha [Evolocumab]     Legs hurt.   Pt is currently taking repatha   Statins Other (See Comments)    Myalgias   Zetia [Ezetimibe]  Muscle pain    Review of Systems  Constitutional:  Negative for chills, fatigue and fever.  HENT:  Positive for congestion, ear pain (left), sinus pressure, sinus pain (left) and sore throat. Negative for postnasal drip and rhinorrhea.   Respiratory:  Positive for cough (productive - green mucus). Negative for shortness of breath.   Cardiovascular:  Negative for chest pain.  Gastrointestinal:  Negative for diarrhea and nausea.  Neurological:  Positive for headaches. Negative for dizziness.       Objective:        02/15/2023   10:00 AM 01/19/2023    3:00 PM 12/15/2022    8:38 AM  Vitals with BMI  Height 5\' 5"   5\' 5"   Weight 148 lbs  142 lbs  BMI 24.63  23.63  Systolic 132 120 202  Diastolic 74 68 68  Pulse 89  82    No data found.   Physical Exam Vitals reviewed.  Constitutional:      General: She is not in acute distress.    Appearance: Normal appearance. She is normal weight. She is ill-appearing.  HENT:     Head: Normocephalic.     Right Ear: Tympanic membrane, ear canal and external ear normal.     Left Ear: External ear normal. Tenderness present. Tympanic membrane is erythematous.     Nose: Congestion present.     Right Turbinates: Pale.     Left Turbinates: Pale.     Left Sinus: Maxillary sinus tenderness and frontal sinus tenderness present.     Mouth/Throat:     Pharynx: Posterior oropharyngeal erythema present.  Cardiovascular:     Rate and Rhythm: Normal rate and regular rhythm.     Heart sounds: Normal heart sounds. No murmur heard. Pulmonary:     Effort: Pulmonary effort is normal. No respiratory distress.     Breath sounds: Normal breath sounds. No wheezing or rhonchi.  Skin:    General: Skin is warm.  Neurological:     Mental Status: She is alert and oriented to person,  place, and time.  Psychiatric:        Mood and Affect: Mood normal.        Behavior: Behavior normal.     Health Maintenance Due  Topic Date Due   DTaP/Tdap/Td (1 - Tdap) Never done   Zoster Vaccines- Shingrix (1 of 2) 03/08/1965   Colonoscopy  12/01/2021   INFLUENZA VACCINE  01/19/2023    There are no preventive care reminders to display for this patient.   Lab Results  Component Value Date   TSH 3.540 12/12/2022   Lab Results  Component Value Date   WBC 7.5 12/12/2022   HGB 11.5 12/12/2022   HCT 35.3 12/12/2022   MCV 89 12/12/2022   PLT 256 12/12/2022   Lab Results  Component Value Date   NA 138 12/12/2022   K 4.6 12/12/2022   CO2 26 12/12/2022   GLUCOSE 79 12/12/2022   BUN 21 12/12/2022   CREATININE 1.02 (H) 12/12/2022   BILITOT <0.2 12/12/2022   ALKPHOS 165 (H) 12/12/2022   AST 20 12/12/2022   ALT 17 12/12/2022   PROT 7.1 12/12/2022   ALBUMIN 4.4 12/12/2022   CALCIUM 9.7 12/12/2022   ANIONGAP 9 07/20/2022   EGFR 57 (L) 12/12/2022   Lab Results  Component Value Date   CHOL 210 (H) 12/12/2022   Lab Results  Component Value Date   HDL 56 12/12/2022   Lab Results  Component Value Date  LDLCALC 122 (H) 12/12/2022   Lab Results  Component Value Date   TRIG 181 (H) 12/12/2022   Lab Results  Component Value Date   CHOLHDL 3.8 12/12/2022   Lab Results  Component Value Date   HGBA1C 5.6 12/12/2022       Assessment & Plan:  Acute non-recurrent frontal sinusitis -     Amoxicillin-Pot Clavulanate; Take 1 tablet by mouth 2 (two) times daily.  Dispense: 20 tablet; Refill: 0  Acute cough Assessment & Plan: Covid - negative Increase water intake   Orders: -     POC COVID-19 BinaxNow     Meds ordered this encounter  Medications   amoxicillin-clavulanate (AUGMENTIN) 875-125 MG tablet    Sig: Take 1 tablet by mouth 2 (two) times daily.    Dispense:  20 tablet    Refill:  0    Orders Placed This Encounter  Procedures   POC COVID-19      Follow-up: Return if symptoms worsen or fail to improve.  An After Visit Summary was printed and given to the patient.  Total time spent on today's visit was greater than 20 minutes, including both face-to-face time and nonface-to-face time personally spent on review of chart (labs and imaging), discussing labs and goals, discussing further work-up, treatment options, referrals to specialist if needed, reviewing outside records of pertinent, answering patient's questions, and coordinating care.   Renne Crigler, FNP [name] [phone]

## 2023-02-17 ENCOUNTER — Other Ambulatory Visit: Payer: Self-pay

## 2023-02-17 ENCOUNTER — Emergency Department (HOSPITAL_BASED_OUTPATIENT_CLINIC_OR_DEPARTMENT_OTHER)
Admission: EM | Admit: 2023-02-17 | Discharge: 2023-02-17 | Disposition: A | Discharge status: Home / Self Care | Payer: Medicare Other | Attending: Emergency Medicine | Admitting: Emergency Medicine

## 2023-02-17 ENCOUNTER — Encounter (HOSPITAL_BASED_OUTPATIENT_CLINIC_OR_DEPARTMENT_OTHER): Payer: Self-pay | Admitting: Emergency Medicine

## 2023-02-17 DIAGNOSIS — B0239 Other herpes zoster eye disease: Secondary | ICD-10-CM | POA: Insufficient documentation

## 2023-02-17 DIAGNOSIS — B029 Zoster without complications: Secondary | ICD-10-CM | POA: Diagnosis not present

## 2023-02-17 DIAGNOSIS — R21 Rash and other nonspecific skin eruption: Secondary | ICD-10-CM | POA: Diagnosis present

## 2023-02-17 MED ORDER — GABAPENTIN 100 MG PO CAPS: 100.0000 mg | ORAL_CAPSULE | Freq: Three times a day (TID) | ORAL | 0 refills | Status: DC

## 2023-02-17 MED ORDER — VALACYCLOVIR HCL 500 MG PO TABS
1000.0000 mg | ORAL_TABLET | Freq: Once | ORAL | Status: AC
  Administered 2023-02-17: 1000 mg via ORAL
  Filled 2023-02-17: qty 2

## 2023-02-17 MED ORDER — FLUORESCEIN SODIUM 1 MG OP STRP
1.0000 | ORAL_STRIP | Freq: Once | OPHTHALMIC | Status: AC
  Administered 2023-02-17: 1 via OPHTHALMIC
  Filled 2023-02-17: qty 1

## 2023-02-17 MED ORDER — VALACYCLOVIR HCL 1 G PO TABS: 1000.0000 mg | ORAL_TABLET | Freq: Three times a day (TID) | ORAL | 0 refills | Status: DC

## 2023-02-17 MED ORDER — ONDANSETRON 4 MG PO TBDP
4.0000 mg | ORAL_TABLET | Freq: Once | ORAL | Status: AC
  Administered 2023-02-17: 4 mg via ORAL
  Filled 2023-02-17: qty 1

## 2023-02-17 MED ORDER — TETRACAINE HCL 0.5 % OP SOLN
2.0000 [drp] | Freq: Once | OPHTHALMIC | Status: AC
  Administered 2023-02-17: 2 [drp] via OPHTHALMIC
  Filled 2023-02-17: qty 4

## 2023-02-17 MED ORDER — MORPHINE SULFATE (PF) 4 MG/ML IV SOLN
4.0000 mg | Freq: Once | INTRAVENOUS | Status: AC
  Administered 2023-02-17: 4 mg via INTRAMUSCULAR
  Filled 2023-02-17: qty 1

## 2023-02-17 MED ORDER — OXYCODONE HCL 5 MG PO TABS
5.0000 mg | ORAL_TABLET | Freq: Once | ORAL | Status: AC
  Administered 2023-02-17: 5 mg via ORAL
  Filled 2023-02-17: qty 1

## 2023-02-17 NOTE — Discharge Instructions (Addendum)
1) because you already have an rx for Oxy-10, I cannot prescribe further opiate medications, however, you may contact your pain management physician to see if there is any additional medications or if there is not increased dose you can take during this time.  2) I have prescribed gabapentin which is a nerve membrane stabilizer which may help decrease her pain.  This may be titrated upward however you will need to contact your primary care physician for this or a pain management specialist.  3) you will need to follow closely with Dr. Genia Del in his office this coming week for recheck of your eye as you are early in the progression of your shingles outbreak on the left side and although you do not have evidence of involvement of the cornea this may progress over the next several days.  You feel like you have grit or sand paper in your eye that is very concerning for development of herpes zoster ophthalmicus.  4) you may also purchase over-the-counter lidocaine cream 4% or 5% which you may apply directly to the skin on your face and scalp.  Avoid to the eye area.  This may help decrease the amount of pain you are having.  This is often found in the hemorrhoid section but may used may be used on any part of the body except for the eye.

## 2023-02-17 NOTE — ED Provider Notes (Signed)
[place] EMERGENCY DEPARTMENT AT [facility] Provider Note   CSN: [id number] Arrival date & time: 02/17/23  4098     History  Chief Complaint  Patient presents with   Rash    Courtney [name] is a 77 y.o. female who presents emergency department with chief complaint of concern for shingles.  Patient states she had an eruption on her left buttocks that consisted of itching and then blistering.  3 days ago she began having a headache and scalp sensitivity and now has developed small blisters over her face which are extremely painful.  She complains of itching in her left eye as well but denies any vision changes.  Patient states she has been under significant stress because her husband was sick for 3 years then very sick for 6 months and died 4 months ago.  She rates her pain at 10 out of 10.  She states the pain is so significant it makes her almost sick to her stomach.  She denies light sensitivity or fevers.   Rash      Home Medications Prior to Admission medications   Medication Sig Start Date End Date Taking? Authorizing Provider  amLODipine (NORVASC) 5 MG tablet Take 5 mg by mouth daily.    [provider]  amoxicillin-clavulanate (AUGMENTIN) 875-125 MG tablet Take 1 tablet by mouth 2 (two) times daily. 02/15/23   Renne Crigler, FNP  D-Mannose 350 MG CAPS Take 350 mg by mouth daily.    [provider]  DULoxetine (CYMBALTA) 30 MG capsule TAKE 2 CAPSULES(60 MG) BY MOUTH DAILY 12/26/22   [name], Kirsten, MD  furosemide (LASIX) 20 MG tablet TAKE 1 TABLET(20 MG) BY MOUTH DAILY 12/26/22   [name], Kirsten, MD  levothyroxine (SYNTHROID) 75 MCG tablet TAKE 1 TABLET(75 MCG) BY MOUTH DAILY 12/26/22   [name], Kirsten, MD  metoprolol tartrate (LOPRESSOR) 50 MG tablet TAKE 1 TABLET(50 MG) BY MOUTH TWICE DAILY WITH MEALS 07/31/22   [name], Kirsten, MD  Multiple Vitamins-Minerals (MULTIVITAMIN WITH MINERALS) tablet Take 1 tablet by mouth daily.    [provider]   nitroGLYCERIN (NITROSTAT) 0.4 MG SL tablet PLACE 1 TABLET UNDER THE TONGUE EVERY 5 MINUTES AS NEEDED FOR CHEST PAIN. 04/25/22   [name], Kirsten, MD  REPATHA SURECLICK 140 MG/ML SOAJ Inject 140 mg into the skin every 14 (fourteen) days.    [provider]  tirzepatide Greggory Keen) 10 MG/0.5ML Pen Inject 10 mg into the skin once a week.    [provider]      Allergies    Levofloxacin, Augmentin [amoxicillin-pot clavulanate], Bactrim [sulfamethoxazole-trimethoprim], Clarithromycin, Lyrica [pregabalin], Nexletol [bempedoic acid], Repatha [evolocumab], Statins, and Zetia [ezetimibe]    Review of Systems   Review of Systems  Skin:  Positive for rash.    Physical Exam Updated Vital Signs BP (!) 158/97 (BP Location: Right Arm)   Pulse 80   Temp 98 F (36.7 C) (Oral)   Resp 16   SpO2 99%  Physical Exam Vitals and nursing note reviewed.  Constitutional:      General: She is not in acute distress.    Appearance: She is well-developed. She is not diaphoretic.  HENT:     Head: Normocephalic and atraumatic.     Right Ear: External ear normal.     Left Ear: External ear normal.     Nose: Nose normal.     Mouth/Throat:     Mouth: Mucous membranes are moist.  Eyes:     General: Vision grossly intact.  No scleral icterus.    Conjunctiva/sclera:     Left eye: Left conjunctiva is injected. No chemosis, exudate or hemorrhage.    Pupils:     Left eye: No fluorescein uptake.     Slit lamp exam:    Left eye: No photophobia.     Comments: No evidence of dendritic lesions on the L EYE  Cardiovascular:     Rate and Rhythm: Normal rate and regular rhythm.     Heart sounds: Normal heart sounds. No murmur heard.    No friction rub. No gallop.  Pulmonary:     Effort: Pulmonary effort is normal. No respiratory distress.     Breath sounds: Normal breath sounds.  Abdominal:     General: Bowel sounds are normal. There is no distension.     Palpations: Abdomen is soft. There is no mass.      Tenderness: There is no abdominal tenderness. There is no guarding.  Musculoskeletal:     Cervical back: Normal range of motion.  Skin:    General: Skin is warm and dry.       Neurological:     Mental Status: She is alert and oriented to person, place, and time.  Psychiatric:        Behavior: Behavior normal.     ED Results / Procedures / Treatments   Labs (all labs ordered are listed, but only abnormal results are displayed) Labs Reviewed - No data to display  EKG None  Radiology No results found.  Procedures Procedures    Medications Ordered in ED Medications  oxyCODONE (Oxy IR/ROXICODONE) immediate release tablet 5 mg (has no administration in time range)  ondansetron (ZOFRAN-ODT) disintegrating tablet 4 mg (has no administration in time range)  valACYclovir (VALTREX) tablet 1,000 mg (has no administration in time range)  fluorescein ophthalmic strip 1 strip (has no administration in time range)  tetracaine (PONTOCAINE) 0.5 % ophthalmic solution 2 drop (has no administration in time range)    ED Course/ Medical Decision Making/ A&P                                 Medical Decision Making Risk Prescription drug management.   Patient here with a shingles outbreak involving the left buttock and the left facial region.  Her left eyelid is involved and there is itching and erythema of the left eye however no evidence of dendritic lesions on examination and no change in her vision  PDMP reviewed during this encounter. Patient is currently on Oxycodone 10 mg tablets at baseline.  She will need to touch base with her pain management physician should she need increased narcotic pain control.  In the meantime I have ordered gabapentin 100 mg 3 times daily which may be titrated upward and she can touch base with her pain management/PCP.  I discussed the case with Dr. Genia Del via phone call.  He will have her seen in the office early next week given the early nature of  her eruption and the potential for development of her for herpes ophthalmicus.  Patient given supportive care techniques,        Final Clinical Impression(s) / ED Diagnoses Final diagnoses:  None    Rx / DC Orders ED Discharge Orders     None         Arthor Captain, PA-C 02/17/23 1103    Melene Plan, DO 02/17/23 1155

## 2023-02-17 NOTE — ED Triage Notes (Signed)
Pt c/o possible shingles to L side of face and L buttock.

## 2023-02-21 ENCOUNTER — Other Ambulatory Visit: Payer: Self-pay

## 2023-02-21 DIAGNOSIS — B0239 Other herpes zoster eye disease: Secondary | ICD-10-CM | POA: Diagnosis not present

## 2023-02-21 DIAGNOSIS — H18413 Arcus senilis, bilateral: Secondary | ICD-10-CM | POA: Diagnosis not present

## 2023-02-21 DIAGNOSIS — R195 Other fecal abnormalities: Secondary | ICD-10-CM

## 2023-02-21 DIAGNOSIS — H532 Diplopia: Secondary | ICD-10-CM | POA: Diagnosis not present

## 2023-02-21 DIAGNOSIS — Z961 Presence of intraocular lens: Secondary | ICD-10-CM | POA: Diagnosis not present

## 2023-02-21 DIAGNOSIS — H04123 Dry eye syndrome of bilateral lacrimal glands: Secondary | ICD-10-CM | POA: Diagnosis not present

## 2023-02-21 DIAGNOSIS — H43393 Other vitreous opacities, bilateral: Secondary | ICD-10-CM | POA: Diagnosis not present

## 2023-02-21 DIAGNOSIS — D3131 Benign neoplasm of right choroid: Secondary | ICD-10-CM | POA: Diagnosis not present

## 2023-02-22 ENCOUNTER — Telehealth: Payer: Self-pay

## 2023-02-22 NOTE — Telephone Encounter (Signed)
Patient called and was wanting to know if she needed to schedule an appointment because she states she was seen at the hospital for shingles which went from her left side to her left eye and side of face. She states that she is following up with Dr. Precious Bard for her eye and returns back to him on Monday and that pain management has her on oxycodone twice daily and the hospital gave her gabapentin and valtrex which she is still taking. Consulted with available providers and told the patient that if she made an appointment with [place] all we would do is take a look and be validating that it is shingles but we would not change course of treatment or add any other medications and that she is on the max dose. We also recommended to the patient if the pain is intolerable that she would need to return back to the ER to be evaluated. Patient understood verbally.

## 2023-02-23 ENCOUNTER — Telehealth: Payer: Self-pay

## 2023-02-23 ENCOUNTER — Emergency Department (HOSPITAL_BASED_OUTPATIENT_CLINIC_OR_DEPARTMENT_OTHER)
Admission: EM | Admit: 2023-02-23 | Discharge: 2023-02-23 | Disposition: A | Discharge status: Home / Self Care | Payer: Medicare Other | Attending: Emergency Medicine | Admitting: Emergency Medicine

## 2023-02-23 ENCOUNTER — Encounter (HOSPITAL_BASED_OUTPATIENT_CLINIC_OR_DEPARTMENT_OTHER): Payer: Self-pay | Admitting: Emergency Medicine

## 2023-02-23 DIAGNOSIS — I2510 Atherosclerotic heart disease of native coronary artery without angina pectoris: Secondary | ICD-10-CM | POA: Insufficient documentation

## 2023-02-23 DIAGNOSIS — E039 Hypothyroidism, unspecified: Secondary | ICD-10-CM | POA: Insufficient documentation

## 2023-02-23 DIAGNOSIS — B0223 Postherpetic polyneuropathy: Secondary | ICD-10-CM | POA: Diagnosis not present

## 2023-02-23 DIAGNOSIS — I10 Essential (primary) hypertension: Secondary | ICD-10-CM | POA: Insufficient documentation

## 2023-02-23 DIAGNOSIS — B0229 Other postherpetic nervous system involvement: Secondary | ICD-10-CM | POA: Diagnosis not present

## 2023-02-23 DIAGNOSIS — Z79899 Other long term (current) drug therapy: Secondary | ICD-10-CM | POA: Diagnosis not present

## 2023-02-23 MED ORDER — ALPRAZOLAM 0.5 MG PO TABS
0.5000 mg | ORAL_TABLET | Freq: Once | ORAL | Status: AC
  Administered 2023-02-23: 0.5 mg via ORAL
  Filled 2023-02-23: qty 1

## 2023-02-23 MED ORDER — KETOROLAC TROMETHAMINE 15 MG/ML IJ SOLN
30.0000 mg | Freq: Once | INTRAMUSCULAR | Status: AC
  Administered 2023-02-23: 30 mg via INTRAMUSCULAR
  Filled 2023-02-23: qty 2

## 2023-02-23 MED ORDER — HYDROMORPHONE HCL 1 MG/ML IJ SOLN
2.0000 mg | Freq: Once | INTRAMUSCULAR | Status: AC
  Administered 2023-02-23: 2 mg via INTRAMUSCULAR
  Filled 2023-02-23: qty 2

## 2023-02-23 NOTE — Telephone Encounter (Signed)
Patient daughter called and stated that patient is wanted to know if there is a shampoo that can be called in for her, due to her having shingles her head is very itchy. Please advise

## 2023-02-23 NOTE — ED Notes (Signed)
Pt verbalized understanding of d/c instructions, meds, and followup care. Denies questions. VSS, no distress noted. Steady gait to exit with all belongings.  ?

## 2023-02-23 NOTE — Telephone Encounter (Signed)
Patient's daughter [name]) aware, verbalized understanding.

## 2023-02-23 NOTE — ED Provider Notes (Signed)
[place] EMERGENCY DEPARTMENT AT [facility] Provider Note   CSN: [id number] Arrival date & time: 02/23/23  1752     History  Chief Complaint  Patient presents with   Herpes Zoster    shingles    Courtney [name] is a 77 y.o. female.  Patient is a 77 year old female with a history of hypothyroidism, CAD, hyperlipidemia, hypertension, chronic pain who was diagnosed with shingles of the left side of the face and eye last week and is returning today due to severe pain and itching in the area.  She is currently taking 20 mg of oxycodone, 100 mg 3 times daily of gabapentin and reports it is not helping with her pain.  She reports repeatedly scratching and digging at the area because it is driving her crazy.  She denies any visual changes.  She is on acyclovir at this time and had received the older shingles vaccine.  The history is provided by the patient.       Home Medications Prior to Admission medications   Medication Sig Start Date End Date Taking? Authorizing Provider  amLODipine (NORVASC) 5 MG tablet Take 5 mg by mouth daily.    [provider]  amoxicillin-clavulanate (AUGMENTIN) 875-125 MG tablet Take 1 tablet by mouth 2 (two) times daily. 02/15/23   Renne Crigler, FNP  D-Mannose 350 MG CAPS Take 350 mg by mouth daily.    [provider]  DULoxetine (CYMBALTA) 30 MG capsule TAKE 2 CAPSULES(60 MG) BY MOUTH DAILY 12/26/22   [name], Kirsten, MD  furosemide (LASIX) 20 MG tablet TAKE 1 TABLET(20 MG) BY MOUTH DAILY 12/26/22   [name], Kirsten, MD  gabapentin (NEURONTIN) 100 MG capsule Take 1 capsule (100 mg total) by mouth 3 (three) times daily. 02/17/23   Arthor Captain, PA-C  levothyroxine (SYNTHROID) 75 MCG tablet TAKE 1 TABLET(75 MCG) BY MOUTH DAILY 12/26/22   [name], Kirsten, MD  metoprolol tartrate (LOPRESSOR) 50 MG tablet TAKE 1 TABLET(50 MG) BY MOUTH TWICE DAILY WITH MEALS 07/31/22   [name], Kirsten, MD  Multiple Vitamins-Minerals (MULTIVITAMIN WITH MINERALS) tablet  Take 1 tablet by mouth daily.    [provider]  nitroGLYCERIN (NITROSTAT) 0.4 MG SL tablet PLACE 1 TABLET UNDER THE TONGUE EVERY 5 MINUTES AS NEEDED FOR CHEST PAIN. 04/25/22   [name], Kirsten, MD  REPATHA SURECLICK 140 MG/ML SOAJ Inject 140 mg into the skin every 14 (fourteen) days.    [provider]  tirzepatide Greggory Keen) 10 MG/0.5ML Pen Inject 10 mg into the skin once a week.    [provider]  valACYclovir (VALTREX) 1000 MG tablet Take 1 tablet (1,000 mg total) by mouth 3 (three) times daily. 02/17/23   Arthor Captain, PA-C      Allergies    Levofloxacin, Augmentin [amoxicillin-pot clavulanate], Bactrim [sulfamethoxazole-trimethoprim], Clarithromycin, Lyrica [pregabalin], Nexletol [bempedoic acid], Repatha [evolocumab], Statins, and Zetia [ezetimibe]    Review of Systems   Review of Systems  Physical Exam Updated Vital Signs BP (!) 163/91 (BP Location: Right Arm)   Pulse (!) 102   Temp 98 F (36.7 C)   Resp 19   SpO2 99%  Physical Exam Vitals and nursing note reviewed.  Constitutional:      Appearance: Normal appearance.  HENT:     Head:   Cardiovascular:     Rate and Rhythm: Tachycardia present.  Pulmonary:     Effort: Pulmonary effort is normal.  Skin:    General: Skin is warm.  Neurological:     Mental Status:  She is alert. Mental status is at baseline.  Psychiatric:        Mood and Affect: Mood normal.     ED Results / Procedures / Treatments   Labs (all labs ordered are listed, but only abnormal results are displayed) Labs Reviewed - No data to display  EKG None  Radiology No results found.  Procedures Procedures    Medications Ordered in ED Medications  HYDROmorphone (DILAUDID) injection 2 mg (2 mg Intramuscular Given 02/23/23 1938)  ketorolac (TORADOL) 15 MG/ML injection 30 mg (30 mg Intramuscular Given 02/23/23 1940)  ALPRAZolam Prudy Feeler) tablet 0.5 mg (0.5 mg Oral Given 02/23/23 2015)    ED Course/ Medical Decision Making/  A&P                                 Medical Decision Making Risk Prescription drug management.   Patient presenting today due to uncontrolled pain from shingles.  Patient is on multiple pain medications at home but will increase her gabapentin to 200 mg 3 times daily and have her continue taking her Percocet which she is currently taking 20 mg.  Will give IM medications tonight for better pain control.  Patient is scratching her skin repeatedly because she reports it is painful but also itches.  Recommend the patient wear mittens so she does not scratch it at night as she is already broken some of her skin open.  No obvious signs of cellulitis at this time or abscess.  Patient improved after meds.  She is ready to go home and will follow-up with pain management tomorrow        Final Clinical Impression(s) / ED Diagnoses Final diagnoses:  Post herpetic neuralgia    Rx / DC Orders ED Discharge Orders     None         Gwyneth Sprout, MD 02/23/23 2031

## 2023-02-23 NOTE — Discharge Instructions (Addendum)
Tomorrow increase your gabapentin to 200 mg 3 times a day and follow-up with your pain doctor as planned they may change you completely.

## 2023-02-23 NOTE — ED Triage Notes (Signed)
Shingles to left side of face and left side of face.left buttock Seen 02/17/23 for shingles in eye (SEEN BY SPECIALIST)  Came in today for increased pain and itchiness. Has been taking double dose of home pain meds with minimal help, unable to speak with pain management (dr out of town)

## 2023-02-24 ENCOUNTER — Encounter: Payer: Self-pay | Admitting: Family Medicine

## 2023-02-24 ENCOUNTER — Telehealth: Payer: Self-pay

## 2023-02-24 ENCOUNTER — Other Ambulatory Visit: Payer: Self-pay | Admitting: Family Medicine

## 2023-02-24 ENCOUNTER — Other Ambulatory Visit: Payer: Self-pay

## 2023-02-24 DIAGNOSIS — M961 Postlaminectomy syndrome, not elsewhere classified: Secondary | ICD-10-CM | POA: Diagnosis not present

## 2023-02-24 DIAGNOSIS — B029 Zoster without complications: Secondary | ICD-10-CM | POA: Diagnosis not present

## 2023-02-24 DIAGNOSIS — R600 Localized edema: Secondary | ICD-10-CM

## 2023-02-24 DIAGNOSIS — I2510 Atherosclerotic heart disease of native coronary artery without angina pectoris: Secondary | ICD-10-CM

## 2023-02-24 MED ORDER — HYDROXYZINE PAMOATE 25 MG PO CAPS: 25.0000 mg | ORAL_CAPSULE | Freq: Three times a day (TID) | ORAL | 0 refills | Status: DC | PRN

## 2023-02-24 MED ORDER — FUROSEMIDE 20 MG PO TABS
ORAL_TABLET | ORAL | 1 refills | Status: DC
Start: 2023-02-24 — End: 2023-03-02

## 2023-02-24 MED ORDER — ALPRAZOLAM 0.5 MG PO TABS: 0.5000 mg | ORAL_TABLET | Freq: Two times a day (BID) | ORAL | 0 refills | Status: DC | PRN

## 2023-02-24 NOTE — Telephone Encounter (Signed)
Patient called stating that she went to the ER last night because her pain is still bad with her shingles and that now they are on her head and scalp and the ER gave her Xanax and they only gave her 1-2 to take home with her and she is requesting a refill to be sent to [facility] on [address] of the xanax if you could. I told the patient you were not in today but I would send message asking.

## 2023-02-27 DIAGNOSIS — H18413 Arcus senilis, bilateral: Secondary | ICD-10-CM | POA: Diagnosis not present

## 2023-02-27 DIAGNOSIS — H04123 Dry eye syndrome of bilateral lacrimal glands: Secondary | ICD-10-CM | POA: Diagnosis not present

## 2023-02-27 DIAGNOSIS — D3131 Benign neoplasm of right choroid: Secondary | ICD-10-CM | POA: Diagnosis not present

## 2023-02-27 DIAGNOSIS — H43393 Other vitreous opacities, bilateral: Secondary | ICD-10-CM | POA: Diagnosis not present

## 2023-02-27 DIAGNOSIS — B0239 Other herpes zoster eye disease: Secondary | ICD-10-CM | POA: Diagnosis not present

## 2023-02-27 DIAGNOSIS — H532 Diplopia: Secondary | ICD-10-CM | POA: Diagnosis not present

## 2023-02-27 DIAGNOSIS — Z961 Presence of intraocular lens: Secondary | ICD-10-CM | POA: Diagnosis not present

## 2023-02-28 ENCOUNTER — Ambulatory Visit (INDEPENDENT_AMBULATORY_CARE_PROVIDER_SITE_OTHER): Payer: Medicare Other

## 2023-02-28 VITALS — BP 128/68 | HR 106 | Temp 98.2°F | Ht 65.0 in | Wt 167.0 lb

## 2023-02-28 DIAGNOSIS — I10 Essential (primary) hypertension: Secondary | ICD-10-CM | POA: Diagnosis not present

## 2023-02-28 DIAGNOSIS — B0229 Other postherpetic nervous system involvement: Secondary | ICD-10-CM | POA: Diagnosis not present

## 2023-02-28 MED ORDER — KETOROLAC TROMETHAMINE 60 MG/2ML IM SOLN
60.0000 mg | Freq: Once | INTRAMUSCULAR | Status: AC
Start: 2023-02-28 — End: 2023-02-28
  Administered 2023-02-28: 60 mg via INTRAMUSCULAR

## 2023-02-28 MED ORDER — HYDROXYZINE PAMOATE 25 MG PO CAPS: 50.0000 mg | ORAL_CAPSULE | Freq: Three times a day (TID) | ORAL | 0 refills | Status: DC | PRN

## 2023-02-28 MED ORDER — GABAPENTIN 300 MG PO CAPS: 300.0000 mg | ORAL_CAPSULE | Freq: Three times a day (TID) | ORAL | 0 refills | Status: DC

## 2023-02-28 NOTE — Assessment & Plan Note (Addendum)
Courtney [name] is here with her daughter for a problem visit.  She was diagnosed with herpes zoster 2 weeks ago on her left forehead, temple, scalp, left eye. She has already completed valtrex course.  On exam, all her shingles lesions have crusted. There was one other crusted lesion in her left ear canal  Plan: For her severe postherpetic neuralgia, since she is already on gabapentin, increased the dosage of gabapentin to 300 mg 3 times daily. -She received a Toradol injection in office today due to severe pain. -Advised her to try Salonpas over-the-counter as she has already tried capsaicin cream. -She could take higher dose of Vistaril that is 1 to 2 tablets or 25 mg every 8 hours as needed for severe itching. -She already takes oxycodone for her chronic pain. -She requested for increasing dose of Cymbalta but I recommended against it as we are increasing the dose of gabapentin at this time. -She is already following up with ophthalmology for her ophthalmic herpes zoster.

## 2023-02-28 NOTE — Progress Notes (Signed)
Acute Office Visit  Subjective:    Patient ID: Courtney [name], female    DOB: June 03, 1946, 77 y.o.   MRN: [id number]  Chief Complaint  Patient presents with   Left Ear pain due to shingles    HPI: Patient is in today for Left ear pain due to shingles. Patient states she has been taking Gabapentin 100 mg 2 tablets 3 times daily.  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 07/25/2014   Followed in Pulmonary clinic/ [facility]/ [place]    Cardiac dysrhythmia    Chronic idiopathic constipation    Colitis, acute 02/08/2021   Coronary artery disease    Depression, major, recurrent, moderate (HCC)    Headache    Heart murmur    History of bladder infections    History of COVID-19    Hyperlipidemia    Hypertension    Hypothyroidism    Knee pain, bilateral    Melena 02/08/2021   Osteoarthritis    Pneumonia    Primary insomnia    Recovering alcoholic (HCC)    SVT (supraventricular tachycardia)    UTI (urinary tract infection)    Varicose veins    Vitamin B12 deficiency     Past Surgical History:  Procedure Laterality Date   ABDOMINAL HYSTERECTOMY  1991   APPENDECTOMY     BACK SURGERY     had 2 surgeries. Have rods placed in back    BREAST BIOPSY Left    x2   BREAST ENHANCEMENT SURGERY  2006   CARDIAC CATHETERIZATION  12/22/2020   stents placed   CHOLECYSTECTOMY  12/2019   removed lap band.    COLONOSCOPY  12/01/2016   Moderate predominantly sigmod diverticulosis. Otherwise grossly normal colonoscopy   EYE SURGERY     IOL- bilateral - [place]   KNEE ARTHROSCOPY Left    lap band surgery  1981   LUMBAR FUSION  07/29/2015   posterior level one   removal of cervical disc fragments  10/2007   pt. denies    TUBAL LIGATION  1970    Family History  Problem Relation Age of Onset   Heart disease Mother    Heart disease Father    Diabetes Sister    Other  Sister        BRAIN TUMOR   Heart disease Brother    Heart disease Brother    Heart disease Brother    Heart disease Brother    Heart disease Brother    Colon cancer Maternal Aunt    Colon cancer Cousin     Social History   Socioeconomic History   Marital status: Married    Spouse name: Not on file   Number of children: 2   Years of education: Not on file   Highest education level: Not on file  Occupational History   Occupation: retired    Comment: Engineer, civil (consulting)  Tobacco Use   Smoking status: Never   Smokeless tobacco: Never  Vaping Use   Vaping status: Never Used  Substance and Sexual Activity   Alcohol use: No    Alcohol/week: 0.0 standard drinks of alcohol    Comment: recovery x 20 yrs   Drug use: No   Sexual activity: Not Currently    Comment: MARRIED  Other Topics Concern   Not on file  Social History Narrative   Not on file  Social Determinants of Health   Financial Resource Strain: Low Risk  (12/12/2022)   Overall Financial Resource Strain (CARDIA)    Difficulty of Paying Living Expenses: Not hard at all  Food Insecurity: No Food Insecurity (08/01/2022)   Hunger Vital Sign    Worried About Running Out of Food in the Last Year: Never true    Ran Out of Food in the Last Year: Never true  Transportation Needs: No Transportation Needs (08/01/2022)   PRAPARE - Administrator, Civil Service (Medical): No    Lack of Transportation (Non-Medical): No  Physical Activity: Inactive (12/12/2022)   Exercise Vital Sign    Days of Exercise per Week: 0 days    Minutes of Exercise per Session: 0 min  Stress: No Stress Concern Present (12/12/2022)   [employer] of Occupational Health - Occupational Stress Questionnaire    Feeling of Stress : Not at all  Social Connections: Moderately Isolated (12/12/2022)   Social Connection and Isolation Panel [NHANES]    Frequency of Communication with Friends and Family: Three times a week    Frequency of Social Gatherings  with Friends and Family: Three times a week    Attends Religious Services: More than 4 times per year    Active Member of Clubs or Organizations: No    Attends Banker Meetings: Never    Marital Status: Widowed  Intimate Partner Violence: Not At Risk (12/12/2022)   Humiliation, Afraid, Rape, and Kick questionnaire    Fear of Current or Ex-Partner: No    Emotionally Abused: No    Physically Abused: No    Sexually Abused: No    Outpatient Medications Prior to Visit  Medication Sig Dispense Refill   ALPRAZolam (XANAX) 0.5 MG tablet Take 1 tablet (0.5 mg total) by mouth 2 (two) times daily as needed for anxiety. 20 tablet 0   amLODipine (NORVASC) 5 MG tablet Take 5 mg by mouth daily.     D-Mannose 350 MG CAPS Take 350 mg by mouth daily.     DULoxetine (CYMBALTA) 30 MG capsule TAKE 2 CAPSULES(60 MG) BY MOUTH DAILY 180 capsule 0   furosemide (LASIX) 20 MG tablet TAKE 1 TABLET(20 MG) BY MOUTH DAILY 90 tablet 1   levothyroxine (SYNTHROID) 75 MCG tablet TAKE 1 TABLET(75 MCG) BY MOUTH DAILY 90 tablet 1   metoprolol tartrate (LOPRESSOR) 50 MG tablet TAKE 1 TABLET(50 MG) BY MOUTH TWICE DAILY WITH MEALS 180 tablet 1   Multiple Vitamins-Minerals (MULTIVITAMIN WITH MINERALS) tablet Take 1 tablet by mouth daily.     nitroGLYCERIN (NITROSTAT) 0.4 MG SL tablet DISSOLVE ONE TABLET UNDER TONGUE AS NEEDED FOR CHEST PAIN EVERY 5 MINUTES FOR UP TO 3 DOSES 25 tablet 1   REPATHA SURECLICK 140 MG/ML SOAJ Inject 140 mg into the skin every 14 (fourteen) days.     tirzepatide (MOUNJARO) 10 MG/0.5ML Pen Inject 10 mg into the skin once a week.     valACYclovir (VALTREX) 1000 MG tablet Take 1 tablet (1,000 mg total) by mouth 3 (three) times daily. 21 tablet 0   gabapentin (NEURONTIN) 100 MG capsule Take 1 capsule (100 mg total) by mouth 3 (three) times daily. (Patient taking differently: Take 200 mg by mouth 3 (three) times daily.) 60 capsule 0   hydrOXYzine (VISTARIL) 25 MG capsule Take 1 capsule (25 mg  total) by mouth every 8 (eight) hours as needed. 30 capsule 0   amoxicillin-clavulanate (AUGMENTIN) 875-125 MG tablet Take 1 tablet by mouth 2 (two) times  daily. 20 tablet 0   No facility-administered medications prior to visit.    Allergies  Allergen Reactions   Levofloxacin Hives, Shortness Of Breath, Swelling and Other (See Comments)    RASH IN MOUTH   (can take IV route)   Augmentin [Amoxicillin-Pot Clavulanate] Nausea Only   Bactrim [Sulfamethoxazole-Trimethoprim] Hives and Itching   Clarithromycin     GI upset Other reaction(s): GI Upset (intolerance) GI upset -can take but does cause mild upset    Lyrica [Pregabalin]    Nexletol [Bempedoic Acid]     Muscle pain   Statins Other (See Comments)    Myalgias   Zetia [Ezetimibe]     Muscle pain    Review of Systems  Constitutional:  Negative for appetite change, fatigue and fever.  HENT:  Positive for ear pain (Left ear pain due to Shingles). Negative for congestion, sinus pressure and sore throat.   Respiratory:  Negative for cough, chest tightness, shortness of breath and wheezing.   Cardiovascular:  Negative for chest pain and palpitations.  Gastrointestinal:  Negative for abdominal pain, constipation, diarrhea, nausea and vomiting.  Genitourinary:  Negative for dysuria and hematuria.  Musculoskeletal:  Negative for arthralgias, back pain, joint swelling and myalgias.  Skin:  Negative for rash.  Neurological:  Negative for dizziness, weakness and headaches.  Psychiatric/Behavioral:  Negative for dysphoric mood. The patient is not nervous/anxious.        Objective:        02/28/2023    9:17 AM 02/28/2023    8:37 AM 02/23/2023    6:01 PM  Vitals with BMI  Height  5\' 5"    Weight  167 lbs   BMI  27.79   Systolic 128 130 132  Diastolic 68 68 91  Pulse  106 440    Orthostatic VS for the past 72 hrs (Last 3 readings):  Patient Position BP Location  02/28/23 0917 Sitting Right Arm  02/28/23 0837 Sitting Left Arm      Physical Exam Constitutional:      General: She is in acute distress (In pain).     Appearance: Normal appearance.  HENT:     Head: Normocephalic.     Comments: Crusted shingles lesions noted on her left forehead, temple, left ear canal, left side of the scalp Cardiovascular:     Rate and Rhythm: Normal rate and regular rhythm.  Pulmonary:     Effort: Pulmonary effort is normal.     Breath sounds: Normal breath sounds.  Neurological:     General: No focal deficit present.     Mental Status: She is alert.  Psychiatric:        Mood and Affect: Mood normal.     Health Maintenance Due  Topic Date Due   DTaP/Tdap/Td (1 - Tdap) Never done   Zoster Vaccines- Shingrix (1 of 2) 03/08/1965   Colonoscopy  12/01/2021   INFLUENZA VACCINE  01/19/2023   Medicare Annual Wellness (AWV)  04/29/2023    There are no preventive care reminders to display for this patient.   Lab Results  Component Value Date   TSH 3.540 12/12/2022   Lab Results  Component Value Date   WBC 7.5 12/12/2022   HGB 11.5 12/12/2022   HCT 35.3 12/12/2022   MCV 89 12/12/2022   PLT 256 12/12/2022   Lab Results  Component Value Date   NA 138 12/12/2022   K 4.6 12/12/2022   CO2 26 12/12/2022   GLUCOSE 79 12/12/2022   BUN 21  12/12/2022   CREATININE 1.02 (H) 12/12/2022   BILITOT <0.2 12/12/2022   ALKPHOS 165 (H) 12/12/2022   AST 20 12/12/2022   ALT 17 12/12/2022   PROT 7.1 12/12/2022   ALBUMIN 4.4 12/12/2022   CALCIUM 9.7 12/12/2022   ANIONGAP 9 07/20/2022   EGFR 57 (L) 12/12/2022   Lab Results  Component Value Date   CHOL 210 (H) 12/12/2022   Lab Results  Component Value Date   HDL 56 12/12/2022   Lab Results  Component Value Date   LDLCALC 122 (H) 12/12/2022   Lab Results  Component Value Date   TRIG 181 (H) 12/12/2022   Lab Results  Component Value Date   CHOLHDL 3.8 12/12/2022   Lab Results  Component Value Date   HGBA1C 5.6 12/12/2022       Assessment & Plan:  Post  herpetic neuralgia Assessment & Plan: Kambryn is here with her daughter for a problem visit.  She was diagnosed with herpes zoster 2 weeks ago on her left forehead, temple, scalp, left eye. She has already completed valtrex course.  On exam, all her shingles lesions have crusted. There was one other crusted lesion in her left ear canal  Plan: For her severe postherpetic neuralgia, since she is already on gabapentin, increased the dosage of gabapentin to 300 mg 3 times daily. -She received a Toradol injection in office today due to severe pain. -Advised her to try Salonpas over-the-counter as she has already tried capsaicin cream. -She could take higher dose of Vistaril that is 1 to 2 tablets or 25 mg every 8 hours as needed for severe itching. -She already takes oxycodone for her chronic pain. -She requested for increasing dose of Cymbalta but I recommended against it as we are increasing the dose of gabapentin at this time. -She is already following up with ophthalmology for her ophthalmic herpes zoster.     Orders: -     Ketorolac Tromethamine  Benign essential HTN Assessment & Plan: Initial blood pressure was elevated due to pain. Repeat blood pressure did come down. No change in her blood pressure medication today. She will continue amlodipine 5 mg daily   Other orders -     Gabapentin; Take 1 capsule (300 mg total) by mouth 3 (three) times daily.  Dispense: 90 capsule; Refill: 0 -     hydrOXYzine Pamoate; Take 2 capsules (50 mg total) by mouth every 8 (eight) hours as needed.  Dispense: 30 capsule; Refill: 0     Meds ordered this encounter  Medications   gabapentin (NEURONTIN) 300 MG capsule    Sig: Take 1 capsule (300 mg total) by mouth 3 (three) times daily.    Dispense:  90 capsule    Refill:  0    Dose increase to 300 mg three times daily   hydrOXYzine (VISTARIL) 25 MG capsule    Sig: Take 2 capsules (50 mg total) by mouth every 8 (eight) hours as needed.     Dispense:  30 capsule    Refill:  0    Dose increased to 2 tabs three times daily as needed   ketorolac (TORADOL) injection 60 mg   Total time spent on today's visit was greater than 30 minutes, including both face-to-face time and nonface-to-face time personally spent on review of chart (labs and imaging), discussing labs and goals, discussing further work-up, treatment options, referrals to specialist if needed, reviewing outside records of pertinent, answering patient's questions, and coordinating care.   No orders of  the defined types were placed in this encounter.    Follow-up: No follow-ups on file.  An After Visit Summary was printed and given to the patient.    I,Lauren M Auman,acting as a Neurosurgeon for [employer], MD.,have documented all relevant documentation on the behalf of Windell Moment, MD,as directed by  Windell Moment, MD while in the presence of Windell Moment, MD.    Windell Moment, MD [name] [phone]

## 2023-02-28 NOTE — Patient Instructions (Signed)
Increase GABAPENTIN to 300 mg three times daily Toradol injection in office today Try SALON PAS CREAM over the counter  Ice packs may help.  Follow up with the eye doctor for the shingles in the left eye  Okay to increase vistaril to 1-2 tabs every 8 hrs as needed for itching.  Follow up as needed

## 2023-02-28 NOTE — Assessment & Plan Note (Signed)
Initial blood pressure was elevated due to pain. Repeat blood pressure did come down. No change in her blood pressure medication today. She will continue amlodipine 5 mg daily

## 2023-03-01 ENCOUNTER — Telehealth: Payer: Self-pay

## 2023-03-01 ENCOUNTER — Ambulatory Visit: Payer: Medicare Other

## 2023-03-01 ENCOUNTER — Other Ambulatory Visit: Payer: Self-pay | Admitting: Family Medicine

## 2023-03-01 NOTE — Telephone Encounter (Signed)
Patient's daughter called and stated that the patient is still having a hard time with itching all over her head and face and neck and pain in her ear/face/neck. She stated that the toradol shot that we gave her did help the patient yesterday a lot. I told the daughter what the provider recommended which was increasing the gabapentin to 600 mg TID and she stated that she did not think that increasing the gabapentin would help the patient but wanted to know if there is something else other than the vistaril to give the patient to stop the itching and also if she could bring her back up here for another toradol shot? Please advise.

## 2023-03-02 ENCOUNTER — Ambulatory Visit (INDEPENDENT_AMBULATORY_CARE_PROVIDER_SITE_OTHER): Payer: Medicare Other | Admitting: Family Medicine

## 2023-03-02 VITALS — BP 124/58 | HR 91 | Temp 97.3°F | Ht 65.0 in | Wt 167.0 lb

## 2023-03-02 DIAGNOSIS — B0229 Other postherpetic nervous system involvement: Secondary | ICD-10-CM

## 2023-03-02 DIAGNOSIS — R600 Localized edema: Secondary | ICD-10-CM | POA: Diagnosis not present

## 2023-03-02 MED ORDER — FUROSEMIDE 40 MG PO TABS: 40.0000 mg | ORAL_TABLET | Freq: Every day | ORAL | 3 refills | Status: DC

## 2023-03-02 MED ORDER — KETOROLAC TROMETHAMINE 60 MG/2ML IM SOLN
60.0000 mg | Freq: Once | INTRAMUSCULAR | Status: AC
Start: 2023-03-02 — End: 2023-03-02
  Administered 2023-03-02: 60 mg via INTRAMUSCULAR

## 2023-03-02 NOTE — Patient Instructions (Addendum)
Gabapentin to 600 mg 3 times a day.  Let me know when you need a refill. Start Lasix 40 mg daily. Increase duloxetine to 60 mg twice a day. Take Zyrtec 10 mg twice daily.  Stop the hydroxyzine (Vistaril.) Keep appointment October 11.

## 2023-03-02 NOTE — Telephone Encounter (Signed)
Patient informed and patient came in this morning for a toradol shot and was seen in the office.

## 2023-03-02 NOTE — Assessment & Plan Note (Addendum)
Administered toradol 60 mg Increase duloxetine to 60 mg twice a day. Increase Gabapentin to 600 mg 3 times a day.  Let me know when you need a refill. Increase duloxetine to 60 mg twice a day. Take Zyrtec 10 mg twice daily.  Stop the hydroxyzine (Vistaril.)

## 2023-03-02 NOTE — Progress Notes (Unsigned)
Acute Office Visit  Subjective:    Patient ID: Courtney [name], female    DOB: 1945/08/02, 77 y.o.   MRN: [id number]  Chief Complaint  Patient presents with  . Herpes Zoster    HPI: Patient is in today for follow up of shingles/postherpetic neuralgia. She was diagnosed 02/17/2023.  She was treated with prednisone and valtrex.   Past Medical History:  Diagnosis Date  . Anemia   . Arthritis   . Bronchiectasis (HCC)   . 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 Cardiac dysrhythmia   . Chronic idiopathic constipation   . Colitis, acute 02/08/2021  . Coronary artery disease   . Depression, major, recurrent, moderate (HCC)   . Headache   . Heart murmur   . History of bladder infections   . History of COVID-19   . Hyperlipidemia   . Hypertension   . Hypothyroidism   . Knee pain, bilateral   . Melena 02/08/2021  . Osteoarthritis   . Pneumonia   . Primary insomnia   . Recovering alcoholic (HCC)   . SVT (supraventricular tachycardia)   . UTI (urinary tract infection)   . Varicose veins   . Vitamin B12 deficiency     Past Surgical History:  Procedure Laterality Date  . ABDOMINAL HYSTERECTOMY  1991  . APPENDECTOMY    . BACK SURGERY     had 2 surgeries. Have rods placed in back   . BREAST BIOPSY Left    x2  . BREAST ENHANCEMENT SURGERY  2006  . CARDIAC CATHETERIZATION  12/22/2020   stents placed  . CHOLECYSTECTOMY  12/2019   removed lap band.   . COLONOSCOPY  12/01/2016   Moderate predominantly sigmod diverticulosis. Otherwise grossly normal colonoscopy  . EYE SURGERY     IOL- bilateral - [place]  . KNEE ARTHROSCOPY Left   . lap band surgery  1981  . LUMBAR FUSION  07/29/2015   posterior level one  . removal of cervical disc fragments  10/2007   pt. denies   . TUBAL LIGATION  1970    Family History  Problem Relation Age of Onset  . Heart disease Mother   . Heart disease  Father   . Diabetes Sister   . Other Sister        BRAIN TUMOR  . Heart disease Brother   . Heart disease Brother   . Heart disease Brother   . Heart disease Brother   . Heart disease Brother   . Colon cancer Maternal Aunt   . Colon cancer Cousin     Social History   Socioeconomic History  . Marital status: Married    Spouse name: Not on file  . Number of children: 2  . Years of education: Not on file  . Highest education level: Not on file  Occupational History  . Occupation: retired    Comment: Engineer, civil (consulting)  Tobacco Use  . Smoking status: Never  . Smokeless tobacco: Never  Vaping Use  . Vaping status: Never Used  Substance and Sexual Activity  . Alcohol use: No    Alcohol/week: 0.0 standard drinks of alcohol    Comment: recovery x 20 yrs  . Drug use: No  . Sexual activity: Not Currently    Comment: MARRIED  Other Topics Concern  . Not on file  Social History Narrative  . Not on file   Social Determinants of Health  Financial Resource Strain: Low Risk  (12/12/2022)   Overall Financial Resource Strain (CARDIA)   . Difficulty of Paying Living Expenses: Not hard at all  Food Insecurity: No Food Insecurity (08/01/2022)   Hunger Vital Sign   . Worried About Programme researcher, broadcasting/film/video in the Last Year: Never true   . Ran Out of Food in the Last Year: Never true  Transportation Needs: No Transportation Needs (08/01/2022)   PRAPARE - Transportation   . Lack of Transportation (Medical): No   . Lack of Transportation (Non-Medical): No  Physical Activity: Inactive (12/12/2022)   Exercise Vital Sign   . Days of Exercise per Week: 0 days   . Minutes of Exercise per Session: 0 min  Stress: No Stress Concern Present (12/12/2022)   [employer] of Occupational Health - Occupational Stress Questionnaire   . Feeling of Stress : Not at all  Social Connections: Moderately Isolated (12/12/2022)   Social Connection and Isolation Panel [NHANES]   . Frequency of Communication with Friends  and Family: Three times a week   . Frequency of Social Gatherings with Friends and Family: Three times a week   . Attends Religious Services: More than 4 times per year   . Active Member of Clubs or Organizations: No   . Attends Banker Meetings: Never   . Marital Status: Widowed  Intimate Partner Violence: Not At Risk (12/12/2022)   Humiliation, Afraid, Rape, and Kick questionnaire   . Fear of Current or Ex-Partner: No   . Emotionally Abused: No   . Physically Abused: No   . Sexually Abused: No    Outpatient Medications Prior to Visit  Medication Sig Dispense Refill  . amLODipine (NORVASC) 5 MG tablet Take 5 mg by mouth daily.    . D-Mannose 350 MG CAPS Take 350 mg by mouth daily.    . DULoxetine (CYMBALTA) 30 MG capsule TAKE 2 CAPSULES(60 MG) BY MOUTH DAILY 180 capsule 0  . levothyroxine (SYNTHROID) 75 MCG tablet TAKE 1 TABLET(75 MCG) BY MOUTH DAILY 90 tablet 1  . metoprolol tartrate (LOPRESSOR) 50 MG tablet TAKE 1 TABLET(50 MG) BY MOUTH TWICE DAILY WITH MEALS 180 tablet 1  . Multiple Vitamins-Minerals (MULTIVITAMIN WITH MINERALS) tablet Take 1 tablet by mouth daily.    . nitroGLYCERIN (NITROSTAT) 0.4 MG SL tablet DISSOLVE ONE TABLET UNDER TONGUE AS NEEDED FOR CHEST PAIN EVERY 5 MINUTES FOR UP TO 3 DOSES 25 tablet 1  . REPATHA SURECLICK 140 MG/ML SOAJ Inject 140 mg into the skin every 14 (fourteen) days.    . tirzepatide (MOUNJARO) 10 MG/0.5ML Pen Inject 10 mg into the skin once a week.    . ALPRAZolam (XANAX) 0.5 MG tablet Take 1 tablet (0.5 mg total) by mouth 2 (two) times daily as needed for anxiety. 20 tablet 0  . furosemide (LASIX) 20 MG tablet TAKE 1 TABLET(20 MG) BY MOUTH DAILY 90 tablet 1  . gabapentin (NEURONTIN) 300 MG capsule Take 1 capsule (300 mg total) by mouth 3 (three) times daily. 90 capsule 0  . hydrOXYzine (VISTARIL) 25 MG capsule Take 2 capsules (50 mg total) by mouth every 8 (eight) hours as needed. 30 capsule 0  . valACYclovir (VALTREX) 1000 MG tablet  Take 1 tablet (1,000 mg total) by mouth 3 (three) times daily. 21 tablet 0   No facility-administered medications prior to visit.    Allergies  Allergen Reactions  . Levofloxacin Hives, Shortness Of Breath, Swelling and Other (See Comments)    RASH IN MOUTH   (  can take IV route)  . Augmentin [Amoxicillin-Pot Clavulanate] Nausea Only  . Bactrim [Sulfamethoxazole-Trimethoprim] Hives and Itching  . Clarithromycin     GI upset Other reaction(s): GI Upset (intolerance) GI upset -can take but does cause mild upset   . Hydroxyzine Swelling  . Lyrica [Pregabalin]   . Nexletol [Bempedoic Acid]     Muscle pain  . Statins Other (See Comments)    Myalgias  . Zetia [Ezetimibe]     Muscle pain    Review of Systems     Objective:        03/10/2023   10:01 AM 03/05/2023    2:46 PM 03/05/2023    2:44 PM  Vitals with BMI  Height 5\' 5"  5\' 5"    Weight 166 lbs 167 lbs 2 oz   BMI 27.62 27.81   Systolic 128  106  Diastolic 76  59  Pulse 76  87    No data found.   Physical Exam  Health Maintenance Due  Topic Date Due  . Zoster Vaccines- Shingrix (1 of 2) 03/[birth year]/1965  . Colonoscopy  12/01/2021  . INFLUENZA VACCINE  01/19/2023  . Medicare Annual Wellness (AWV)  04/29/2023    There are no preventive care reminders to display for this patient.   Lab Results  Component Value Date   TSH 2.110 03/02/2023   Lab Results  Component Value Date   WBC 9.8 03/03/2023   HGB 10.6 (L) 03/03/2023   HCT 32.2 (L) 03/03/2023   MCV 97.9 03/03/2023   PLT 224 03/03/2023   Lab Results  Component Value Date   NA 131 (L) 03/03/2023   K 4.5 03/03/2023   CO2 36 (H) 03/03/2023   GLUCOSE 88 03/03/2023   BUN 48 (H) 03/03/2023   CREATININE 1.37 (H) 03/03/2023   BILITOT 0.4 03/03/2023   ALKPHOS 83 03/03/2023   AST 21 03/03/2023   ALT 19 03/03/2023   PROT 6.6 03/03/2023   ALBUMIN 3.7 03/03/2023   CALCIUM 9.0 03/03/2023   ANIONGAP 6 03/03/2023   EGFR 53 (L) 03/02/2023   Lab Results   Component Value Date   CHOL 210 (H) 12/12/2022   Lab Results  Component Value Date   HDL 56 12/12/2022   Lab Results  Component Value Date   LDLCALC 122 (H) 12/12/2022   Lab Results  Component Value Date   TRIG 181 (H) 12/12/2022   Lab Results  Component Value Date   CHOLHDL 3.8 12/12/2022   Lab Results  Component Value Date   HGBA1C 5.6 12/12/2022       Assessment & Plan:  Post herpetic neuralgia Assessment & Plan: Administered toradol 60 mg Increase duloxetine to 60 mg twice a day.   Orders: -     Ketorolac Tromethamine  Localized edema Assessment & Plan: Start Lasix 40 mg daily. Check labs  Orders: -     Comprehensive metabolic panel -     TSH -     CBC with Differential/Platelet  Other orders -     Furosemide; Take 1 tablet (40 mg total) by mouth daily.  Dispense: 30 tablet; Refill: 3 -     [facility] CKD [facility]     Meds ordered this encounter  Medications  . furosemide (LASIX) 40 MG tablet    Sig: Take 1 tablet (40 mg total) by mouth daily.    Dispense:  30 tablet    Refill:  3  . ketorolac (TORADOL) injection 60 mg    Orders Placed This Encounter  Procedures  . Comprehensive metabolic panel  . TSH  . CBC with Differential/Platelet  . [facility] CKD [facility]     Follow-up: No follow-ups on file.  An After Visit Summary was printed and given to the patient.  Blane Ohara, MD [facility] [phone]

## 2023-03-02 NOTE — Progress Notes (Deleted)
Duplicate

## 2023-03-03 ENCOUNTER — Other Ambulatory Visit: Payer: Self-pay

## 2023-03-03 ENCOUNTER — Emergency Department (HOSPITAL_BASED_OUTPATIENT_CLINIC_OR_DEPARTMENT_OTHER): Payer: Medicare Other

## 2023-03-03 ENCOUNTER — Emergency Department (HOSPITAL_BASED_OUTPATIENT_CLINIC_OR_DEPARTMENT_OTHER)
Admission: EM | Admit: 2023-03-03 | Discharge: 2023-03-03 | Disposition: A | Discharge status: Home / Self Care | Payer: Medicare Other | Attending: Emergency Medicine | Admitting: Emergency Medicine

## 2023-03-03 ENCOUNTER — Emergency Department (HOSPITAL_BASED_OUTPATIENT_CLINIC_OR_DEPARTMENT_OTHER): Payer: Medicare Other | Admitting: Radiology

## 2023-03-03 DIAGNOSIS — J320 Chronic maxillary sinusitis: Secondary | ICD-10-CM | POA: Diagnosis not present

## 2023-03-03 DIAGNOSIS — S0990XA Unspecified injury of head, initial encounter: Secondary | ICD-10-CM

## 2023-03-03 DIAGNOSIS — S0101XA Laceration without foreign body of scalp, initial encounter: Secondary | ICD-10-CM | POA: Diagnosis not present

## 2023-03-03 DIAGNOSIS — W182XXA Fall in (into) shower or empty bathtub, initial encounter: Secondary | ICD-10-CM | POA: Insufficient documentation

## 2023-03-03 DIAGNOSIS — J479 Bronchiectasis, uncomplicated: Secondary | ICD-10-CM | POA: Diagnosis not present

## 2023-03-03 DIAGNOSIS — S51811A Laceration without foreign body of right forearm, initial encounter: Secondary | ICD-10-CM | POA: Insufficient documentation

## 2023-03-03 DIAGNOSIS — Z23 Encounter for immunization: Secondary | ICD-10-CM | POA: Insufficient documentation

## 2023-03-03 DIAGNOSIS — W19XXXA Unspecified fall, initial encounter: Secondary | ICD-10-CM

## 2023-03-03 DIAGNOSIS — Y92002 Bathroom of unspecified non-institutional (private) residence single-family (private) house as the place of occurrence of the external cause: Secondary | ICD-10-CM | POA: Insufficient documentation

## 2023-03-03 DIAGNOSIS — J984 Other disorders of lung: Secondary | ICD-10-CM | POA: Diagnosis not present

## 2023-03-03 DIAGNOSIS — Z79899 Other long term (current) drug therapy: Secondary | ICD-10-CM | POA: Diagnosis not present

## 2023-03-03 DIAGNOSIS — Z043 Encounter for examination and observation following other accident: Secondary | ICD-10-CM | POA: Diagnosis not present

## 2023-03-03 DIAGNOSIS — R9082 White matter disease, unspecified: Secondary | ICD-10-CM | POA: Diagnosis not present

## 2023-03-03 DIAGNOSIS — R600 Localized edema: Secondary | ICD-10-CM | POA: Diagnosis not present

## 2023-03-03 DIAGNOSIS — S199XXA Unspecified injury of neck, initial encounter: Secondary | ICD-10-CM | POA: Diagnosis not present

## 2023-03-03 LAB — URINALYSIS, W/ REFLEX TO CULTURE (INFECTION SUSPECTED)
Bacteria, UA: NONE SEEN
Bilirubin Urine: NEGATIVE
Glucose, UA: NEGATIVE mg/dL
Hgb urine dipstick: NEGATIVE
Ketones, ur: NEGATIVE mg/dL
Leukocytes,Ua: NEGATIVE
Nitrite: NEGATIVE
Protein, ur: NEGATIVE mg/dL
Specific Gravity, Urine: 1.011 (ref 1.005–1.030)
pH: 6.5 (ref 5.0–8.0)

## 2023-03-03 LAB — COMPREHENSIVE METABOLIC PANEL
ALT: 21 IU/L (ref 0–32)
ALT: 19 U/L (ref 0–44)
AST: 23 IU/L (ref 0–40)
AST: 21 U/L (ref 15–41)
Albumin: 4.2 g/dL (ref 3.8–4.8)
Albumin: 3.7 g/dL (ref 3.5–5.0)
Alkaline Phosphatase: 117 IU/L (ref 44–121)
Alkaline Phosphatase: 83 U/L (ref 38–126)
Anion gap: 6 (ref 5–15)
BUN/Creatinine Ratio: 36 — ABNORMAL HIGH (ref 12–28)
BUN: 39 mg/dL — ABNORMAL HIGH (ref 8–27)
BUN: 48 mg/dL — ABNORMAL HIGH (ref 8–23)
Bilirubin Total: <0.2 mg/dL (ref 0.0–1.2)
CO2: 29 mmol/L (ref 20–29)
CO2: 36 mmol/L — ABNORMAL HIGH (ref 22–32)
Calcium: 9.7 mg/dL (ref 8.7–10.3)
Calcium: 9 mg/dL (ref 8.9–10.3)
Chloride: 92 mmol/L — ABNORMAL LOW (ref 96–106)
Chloride: 89 mmol/L — ABNORMAL LOW (ref 98–111)
Creatinine, Ser: 1.08 mg/dL — ABNORMAL HIGH (ref 0.57–1.00)
Creatinine, Ser: 1.37 mg/dL — ABNORMAL HIGH (ref 0.44–1.00)
GFR, Estimated: 40 mL/min — ABNORMAL LOW (ref >60)
Globulin, Total: 2.3 g/dL (ref 1.5–4.5)
Glucose, Bld: 88 mg/dL (ref 70–99)
Glucose: 74 mg/dL (ref 70–99)
Potassium: 5 mmol/L (ref 3.5–5.2)
Potassium: 4.5 mmol/L (ref 3.5–5.1)
Sodium: 141 mmol/L (ref 134–144)
Sodium: 131 mmol/L — ABNORMAL LOW (ref 135–145)
Total Bilirubin: 0.4 mg/dL (ref 0.3–1.2)
Total Protein: 6.5 g/dL (ref 6.0–8.5)
Total Protein: 6.6 g/dL (ref 6.5–8.1)
eGFR: 53 mL/min/{1.73_m2} — ABNORMAL LOW (ref >59)

## 2023-03-03 LAB — CBC WITH DIFFERENTIAL/PLATELET
Abs Immature Granulocytes: 0.09 10*3/uL — ABNORMAL HIGH (ref 0.00–0.07)
Basophils Absolute: 0.1 10*3/uL (ref 0.0–0.2)
Basophils Absolute: 0.1 10*3/uL (ref 0.0–0.1)
Basophils Relative: 1 %
Basos: 1 %
EOS (ABSOLUTE): 0.7 10*3/uL — ABNORMAL HIGH (ref 0.0–0.4)
Eos: 7 %
Eosinophils Absolute: 0.8 10*3/uL — ABNORMAL HIGH (ref 0.0–0.5)
Eosinophils Relative: 8 %
HCT: 32.2 % — ABNORMAL LOW (ref 36.0–46.0)
Hematocrit: 34.3 % (ref 34.0–46.6)
Hemoglobin: 11.2 g/dL (ref 11.1–15.9)
Hemoglobin: 10.6 g/dL — ABNORMAL LOW (ref 12.0–15.0)
Immature Grans (Abs): 0.1 10*3/uL (ref 0.0–0.1)
Immature Granulocytes: 1 %
Immature Granulocytes: 1 %
Lymphocytes Absolute: 2.8 10*3/uL (ref 0.7–3.1)
Lymphocytes Relative: 27 %
Lymphs Abs: 2.6 10*3/uL (ref 0.7–4.0)
Lymphs: 28 %
MCH: 31.2 pg (ref 26.6–33.0)
MCH: 32.2 pg (ref 26.0–34.0)
MCHC: 32.7 g/dL (ref 31.5–35.7)
MCHC: 32.9 g/dL (ref 30.0–36.0)
MCV: 96 fL (ref 79–97)
MCV: 97.9 fL (ref 80.0–100.0)
Monocytes Absolute: 0.9 10*3/uL (ref 0.1–0.9)
Monocytes Absolute: 0.9 10*3/uL (ref 0.1–1.0)
Monocytes Relative: 9 %
Monocytes: 9 %
Neutro Abs: 5.3 10*3/uL (ref 1.7–7.7)
Neutrophils Absolute: 5.6 10*3/uL (ref 1.4–7.0)
Neutrophils Relative %: 54 %
Neutrophils: 54 %
Platelets: 271 10*3/uL (ref 150–450)
Platelets: 224 10*3/uL (ref 150–400)
RBC: 3.59 x10E6/uL — ABNORMAL LOW (ref 3.77–5.28)
RBC: 3.29 MIL/uL — ABNORMAL LOW (ref 3.87–5.11)
RDW: 13.4 % (ref 11.7–15.4)
RDW: 14.4 % (ref 11.5–15.5)
WBC: 10.2 10*3/uL (ref 3.4–10.8)
WBC: 9.8 10*3/uL (ref 4.0–10.5)
nRBC: 0 % (ref 0.0–0.2)

## 2023-03-03 LAB — TSH: TSH: 2.11 u[IU]/mL (ref 0.450–4.500)

## 2023-03-03 LAB — LITHOLINK CKD PROGRAM

## 2023-03-03 LAB — BRAIN NATRIURETIC PEPTIDE: B Natriuretic Peptide: 30.8 pg/mL (ref 0.0–100.0)

## 2023-03-03 MED ORDER — VALACYCLOVIR HCL 1 G PO TABS: 1000.0000 mg | ORAL_TABLET | Freq: Three times a day (TID) | ORAL | 0 refills | Status: DC

## 2023-03-03 MED ORDER — HYDROMORPHONE HCL 1 MG/ML IJ SOLN: 1.0000 mg | Freq: Once | INTRAMUSCULAR | Status: DC

## 2023-03-03 MED ORDER — KETOROLAC TROMETHAMINE 15 MG/ML IJ SOLN
15.0000 mg | Freq: Once | INTRAMUSCULAR | Status: AC
  Administered 2023-03-03: 15 mg via INTRAVENOUS
  Filled 2023-03-03: qty 1

## 2023-03-03 MED ORDER — HYDROMORPHONE HCL 1 MG/ML IJ SOLN
1.0000 mg | Freq: Once | INTRAMUSCULAR | Status: AC
  Administered 2023-03-03: 1 mg via INTRAVENOUS
  Filled 2023-03-03: qty 1

## 2023-03-03 MED ORDER — TETANUS-DIPHTH-ACELL PERTUSSIS 5-2.5-18.5 LF-MCG/0.5 IM SUSY
0.5000 mL | PREFILLED_SYRINGE | Freq: Once | INTRAMUSCULAR | Status: AC
  Administered 2023-03-03: 0.5 mL via INTRAMUSCULAR
  Filled 2023-03-03: qty 0.5

## 2023-03-03 MED ORDER — LIDOCAINE-EPINEPHRINE (PF) 2 %-1:200000 IJ SOLN
20.0000 mL | Freq: Once | INTRAMUSCULAR | Status: AC
  Administered 2023-03-03: 20 mL
  Filled 2023-03-03: qty 20

## 2023-03-03 NOTE — ED Triage Notes (Signed)
  Patient comes in after a fall that occurred around 0230 this morning.  Patient states she was getting out of bed to use the bathroom and had mechanical fall due to her leg swelling.  Patient states she had her lasix increased to 40 mg daily.  Patient has laceration to back of head, with bleeding controlled at this time.  No LOC.  No blood thinners.  Pain 8/10, throbbing.  Took 10 mg oxycodone at 0500, and 650 mg of tylenol.

## 2023-03-03 NOTE — ED Notes (Signed)
Patient transported to CT 

## 2023-03-03 NOTE — ED Provider Notes (Signed)
[place] EMERGENCY DEPARTMENT AT [facility] Provider Note   CSN: [id number] Arrival date & time: 03/03/23  6295     History  Chief Complaint  Patient presents with   Fall   Leg Swelling    Courtney [name] is a 77 y.o. female.  She is brought in by her daughter after mechanical fall just prior to arrival.  Patient states she was getting out of bed to use the bathroom and got tangled up with her slippers fell to the ground hit her head.  No loss of consciousness.  She is not on any blood thinners.  Complaining of scalp laceration.  She is also recently had shingles as had significant pain with that.  She is took some oral narcotics prior to arrival which have not helped much.  She has been having some chronic worsening lower extremity edema and her doctor is just increased her diuretic.  The history is provided by the patient.  Fall This is a new problem. The problem has not changed since onset.Associated symptoms include headaches. Pertinent negatives include no chest pain, no abdominal pain and no shortness of breath. Nothing aggravates the symptoms. Nothing relieves the symptoms. Treatments tried: oxycodone. The treatment provided mild relief.       Home Medications Prior to Admission medications   Medication Sig Start Date End Date Taking? Authorizing Provider  ALPRAZolam Prudy Feeler) 0.5 MG tablet Take 1 tablet (0.5 mg total) by mouth 2 (two) times daily as needed for anxiety. 02/24/23   [name], Fritzi Mandes, MD  amLODipine (NORVASC) 5 MG tablet Take 5 mg by mouth daily.    [provider]  D-Mannose 350 MG CAPS Take 350 mg by mouth daily.    [provider]  DULoxetine (CYMBALTA) 30 MG capsule TAKE 2 CAPSULES(60 MG) BY MOUTH DAILY 12/26/22   [name], Kirsten, MD  furosemide (LASIX) 40 MG tablet Take 1 tablet (40 mg total) by mouth daily. 03/02/23   [name], Fritzi Mandes, MD  gabapentin (NEURONTIN) 300 MG capsule Take 1 capsule (300 mg total) by mouth 3 (three) times daily. 02/28/23  03/30/23  Windell Moment, MD  hydrOXYzine (VISTARIL) 25 MG capsule Take 2 capsules (50 mg total) by mouth every 8 (eight) hours as needed. 02/28/23   Sirivol, Kristie Cowman, MD  levothyroxine (SYNTHROID) 75 MCG tablet TAKE 1 TABLET(75 MCG) BY MOUTH DAILY 12/26/22   [name], Kirsten, MD  metoprolol tartrate (LOPRESSOR) 50 MG tablet TAKE 1 TABLET(50 MG) BY MOUTH TWICE DAILY WITH MEALS 07/31/22   [name], Kirsten, MD  Multiple Vitamins-Minerals (MULTIVITAMIN WITH MINERALS) tablet Take 1 tablet by mouth daily.    [provider]  nitroGLYCERIN (NITROSTAT) 0.4 MG SL tablet DISSOLVE ONE TABLET UNDER TONGUE AS NEEDED FOR CHEST PAIN EVERY 5 MINUTES FOR UP TO 3 DOSES 02/24/23   Sirivol, Kristie Cowman, MD  REPATHA SURECLICK 140 MG/ML SOAJ Inject 140 mg into the skin every 14 (fourteen) days.    [provider]  tirzepatide Greggory Keen) 10 MG/0.5ML Pen Inject 10 mg into the skin once a week.    [provider]  valACYclovir (VALTREX) 1000 MG tablet Take 1 tablet (1,000 mg total) by mouth 3 (three) times daily. 03/03/23   CardamaAmadeo Garnet, MD      Allergies    Levofloxacin, Augmentin [amoxicillin-pot clavulanate], Bactrim [sulfamethoxazole-trimethoprim], Clarithromycin, Lyrica [pregabalin], Nexletol [bempedoic acid], Statins, and Zetia [ezetimibe]    Review of Systems   Review of Systems  Constitutional:  Negative for fever.  Eyes:  Negative for visual disturbance.  Respiratory:  Negative for shortness of breath.   Cardiovascular:  Negative for chest pain.  Gastrointestinal:  Negative for abdominal pain.  Musculoskeletal:  Positive for back pain (chronic).  Skin:  Positive for wound.  Neurological:  Positive for headaches.    Physical Exam Updated Vital Signs BP 117/69 (BP Location: Right Arm)   Pulse 80   Temp 98 F (36.7 C) (Oral)   Resp 18   Ht 5\' 5"  (1.651 m)   Wt 75.8 kg   SpO2 93%   BMI 27.79 kg/m  Physical Exam Vitals and nursing note reviewed.  Constitutional:      General:  She is not in acute distress.    Appearance: Normal appearance. She is well-developed.  HENT:     Head: Normocephalic.     Comments: She has approximately 3 cm laceration on the crown of her head.  There is no active bleeding Eyes:     Conjunctiva/sclera: Conjunctivae normal.  Cardiovascular:     Rate and Rhythm: Normal rate and regular rhythm.     Heart sounds: No murmur heard. Pulmonary:     Effort: Pulmonary effort is normal. No respiratory distress.     Breath sounds: Normal breath sounds.  Abdominal:     Palpations: Abdomen is soft.     Tenderness: There is no abdominal tenderness.  Musculoskeletal:        General: Tenderness present. No deformity. Normal range of motion.     Cervical back: Neck supple.     Right lower leg: Edema present.     Left lower leg: Edema present.     Comments: She has some skin tears on her right forearm  Skin:    General: Skin is warm and dry.     Capillary Refill: Capillary refill takes less than 2 seconds.  Neurological:     General: No focal deficit present.     Mental Status: She is alert.     Motor: No weakness.     ED Results / Procedures / Treatments   Labs (all labs ordered are listed, but only abnormal results are displayed) Labs Reviewed  CBC WITH DIFFERENTIAL/PLATELET - Abnormal; Notable for the following components:      Result Value   RBC 3.29 (*)    Hemoglobin 10.6 (*)    HCT 32.2 (*)    Eosinophils Absolute 0.8 (*)    Abs Immature Granulocytes 0.09 (*)    All other components within normal limits  COMPREHENSIVE METABOLIC PANEL - Abnormal; Notable for the following components:   Sodium 131 (*)    Chloride 89 (*)    CO2 36 (*)    BUN 48 (*)    Creatinine, Ser 1.37 (*)    GFR, Estimated 40 (*)    All other components within normal limits  BRAIN NATRIURETIC PEPTIDE  URINALYSIS, W/ REFLEX TO CULTURE (INFECTION SUSPECTED)    EKG None  Radiology DG Chest 2 View  Result Date: 03/03/2023 CLINICAL DATA:  Mechanical  fall. EXAM: CHEST - 2 VIEW COMPARISON:  06/29/2021 FINDINGS: Chronic linear opacities in the bilateral lungs, greatest at the left base. There is chronic reticular opacity and bronchiectasis by CT. There is no edema, consolidation, effusion, or pneumothorax. Stable heart size and mediastinal contours when allowing for prominent distortion from rotation. Scoliosis. No acute osseous finding. IMPRESSION: Chronic lung disease without acute superimposed finding when compared to priors. Electronically Signed   By: Tiburcio Pea M.D.   On: 03/03/2023 07:03   CT HEAD WO CONTRAST ( )  Result Date: 03/03/2023 CLINICAL DATA:  Moderate to severe head trauma. Mechanical fall. Neck trauma. EXAM: CT HEAD WITHOUT CONTRAST CT CERVICAL SPINE WITHOUT CONTRAST TECHNIQUE: Multidetector CT imaging of the head and cervical spine was performed following the standard protocol without intravenous contrast. Multiplanar CT image reconstructions of the cervical spine were also generated. RADIATION DOSE REDUCTION: This exam was performed according to the departmental dose-optimization program which includes automated exposure control, adjustment of the mA and/or kV according to patient size and/or use of iterative reconstruction technique. COMPARISON:  None Available. FINDINGS: CT HEAD FINDINGS Brain: No evidence of acute infarction, hemorrhage, hydrocephalus, extra-axial collection or mass lesion/mass effect. Chronic small vessel disease in the cerebral white matter that is mild. Mild for age cerebral volume loss. Vascular: No hyperdense vessel or unexpected calcification. Skull: Negative for fracture Sinuses/Orbits: Lobulated and partially calcified opacification in the left maxillary sinus a debris like appearance. No evidence of injury. CT CERVICAL SPINE FINDINGS Alignment: No traumatic malalignment. Skull base and vertebrae: No acute fracture. No primary bone lesion or focal pathologic process. Soft tissues and spinal canal: No  prevertebral fluid or swelling. No visible canal hematoma. Disc levels:  Generalized degenerative endplate and facet spurring. Upper chest: Clear apical lungs IMPRESSION: 1. No evidence of acute intracranial or cervical spine injury. 2. Chronic left maxillary sinusitis with debris. Electronically Signed   By: Tiburcio Pea M.D.   On: 03/03/2023 07:00   CT Cervical Spine Wo Contrast  Result Date: 03/03/2023 CLINICAL DATA:  Moderate to severe head trauma. Mechanical fall. Neck trauma. EXAM: CT HEAD WITHOUT CONTRAST CT CERVICAL SPINE WITHOUT CONTRAST TECHNIQUE: Multidetector CT imaging of the head and cervical spine was performed following the standard protocol without intravenous contrast. Multiplanar CT image reconstructions of the cervical spine were also generated. RADIATION DOSE REDUCTION: This exam was performed according to the departmental dose-optimization program which includes automated exposure control, adjustment of the mA and/or kV according to patient size and/or use of iterative reconstruction technique. COMPARISON:  None Available. FINDINGS: CT HEAD FINDINGS Brain: No evidence of acute infarction, hemorrhage, hydrocephalus, extra-axial collection or mass lesion/mass effect. Chronic small vessel disease in the cerebral white matter that is mild. Mild for age cerebral volume loss. Vascular: No hyperdense vessel or unexpected calcification. Skull: Negative for fracture Sinuses/Orbits: Lobulated and partially calcified opacification in the left maxillary sinus a debris like appearance. No evidence of injury. CT CERVICAL SPINE FINDINGS Alignment: No traumatic malalignment. Skull base and vertebrae: No acute fracture. No primary bone lesion or focal pathologic process. Soft tissues and spinal canal: No prevertebral fluid or swelling. No visible canal hematoma. Disc levels:  Generalized degenerative endplate and facet spurring. Upper chest: Clear apical lungs IMPRESSION: 1. No evidence of acute  intracranial or cervical spine injury. 2. Chronic left maxillary sinusitis with debris. Electronically Signed   By: Tiburcio Pea M.D.   On: 03/03/2023 07:00    Procedures .Marland KitchenLaceration Repair  Date/Time: 03/03/2023 8:01 AM  Performed by: Terrilee Files, MD Authorized by: Terrilee Files, MD   Consent:    Consent obtained:  Verbal   Consent given by:  Patient   Risks, benefits, and alternatives were discussed: yes     Risks discussed:  Infection, nerve damage, poor wound healing, pain, retained foreign body, tendon damage and vascular damage   Alternatives discussed:  No treatment, delayed treatment and referral Universal protocol:    Procedure explained and questions answered to patient or proxy's satisfaction: yes     Patient identity confirmed:  Verbally with patient Anesthesia:    Anesthesia method:  Local infiltration   Local anesthetic:  Lidocaine 2% WITH epi Laceration details:    Location:  Scalp   Scalp location:  Crown   Length (cm):  4 Treatment:    Area cleansed with:  Shur-Clens Skin repair:    Repair method:  Staples   Number of staples:  4 Approximation:    Approximation:  Close Repair type:    Repair type:  Simple Post-procedure details:    Dressing:  Open (no dressing)   Procedure completion:  Tolerated well, no immediate complications     Medications Ordered in ED Medications  HYDROmorphone (DILAUDID) injection 1 mg (has no administration in time range)    ED Course/ Medical Decision Making/ A&P Clinical Course as of 03/03/23 0709  Fri Mar 03, 2023  0703 Chest x-ray interpreted by me is significant scoliosis no clear infiltrate.  Awaiting radiology reading. [MB]    Clinical Course User Index [MB] Terrilee Files, MD                                 Medical Decision Making Risk Prescription drug management.   This patient complains of fall head injury; this involves an extensive number of treatment Options and is a complaint that  carries with it a high risk of complications and morbidity. The differential includes fracture, contusion, bleed, laceration, metabolic derangement  I ordered, reviewed and interpreted labs, which included CBC with normal white count stable hemoglobin, chemistries with some chronic CKD, urinalysis without clear signs of infection  I ordered medication IV Dilaudid IV Toradol tetanus update and reviewed PMP when indicated. I ordered imaging studies which included CT head and cervical spine, chest x-ray and I independently    visualized and interpreted imaging which showed soft tissue swelling no acute bleed Additional history obtained from patient's daughter Previous records obtained and reviewed in epic including recent PCP notes Cardiac monitoring reviewed, sinus rhythm Social determinants considered, patient with depression and physically inactive Critical Interventions: None  After the interventions stated above, I reevaluated the patient and found patient to be awake alert no distress Admission and further testing considered, no indications for admission or further workup at this time.  Recommended follow-up with PCP.  Return instructions discussed         Final Clinical Impression(s) / ED Diagnoses Final diagnoses:  Fall, initial encounter  Laceration of scalp, initial encounter  Injury of head, initial encounter    Rx / DC Orders ED Discharge Orders          Ordered    valACYclovir (VALTREX) 1000 MG tablet  3 times daily        03/03/23 5956              Terrilee Files, MD 03/03/23 1659

## 2023-03-03 NOTE — Discharge Instructions (Signed)
You are seen in the emergency department for evaluation of injuries from a fall.  You had a CAT scan of your head and neck along with chest x-ray that did not show any significant traumatic findings.  Your scalp laceration was stapled and this will need to be removed in 7 to 10 days.  You may wash her hair and can use soap and water.  Follow-up with your doctor or return to the emergency department if any concerns

## 2023-03-04 DIAGNOSIS — R600 Localized edema: Secondary | ICD-10-CM | POA: Insufficient documentation

## 2023-03-04 NOTE — Assessment & Plan Note (Addendum)
Start Lasix 40 mg daily. Check labs Keep appointment October 11.

## 2023-03-05 ENCOUNTER — Emergency Department (HOSPITAL_BASED_OUTPATIENT_CLINIC_OR_DEPARTMENT_OTHER)
Admission: EM | Admit: 2023-03-05 | Discharge: 2023-03-05 | Disposition: A | Discharge status: Home / Self Care | Payer: Medicare Other | Attending: Emergency Medicine | Admitting: Emergency Medicine

## 2023-03-05 ENCOUNTER — Other Ambulatory Visit: Payer: Self-pay

## 2023-03-05 ENCOUNTER — Encounter (HOSPITAL_BASED_OUTPATIENT_CLINIC_OR_DEPARTMENT_OTHER): Payer: Self-pay

## 2023-03-05 DIAGNOSIS — B0229 Other postherpetic nervous system involvement: Secondary | ICD-10-CM | POA: Diagnosis not present

## 2023-03-05 MED ORDER — KETOROLAC TROMETHAMINE 30 MG/ML IJ SOLN
30.0000 mg | Freq: Once | INTRAMUSCULAR | Status: AC
  Administered 2023-03-05: 30 mg via INTRAVENOUS
  Filled 2023-03-05: qty 1

## 2023-03-05 MED ORDER — HYDROMORPHONE HCL 1 MG/ML IJ SOLN
1.0000 mg | Freq: Once | INTRAMUSCULAR | Status: AC
  Administered 2023-03-05: 1 mg via INTRAMUSCULAR
  Filled 2023-03-05: qty 1

## 2023-03-05 MED ORDER — FLUORESCEIN SODIUM 1 MG OP STRP
1.0000 | ORAL_STRIP | Freq: Once | OPHTHALMIC | Status: AC
  Administered 2023-03-05: 1 via OPHTHALMIC
  Filled 2023-03-05: qty 1

## 2023-03-05 MED ORDER — PREDNISONE 10 MG (21) PO TBPK: ORAL_TABLET | Freq: Every day | ORAL | 0 refills | Status: DC

## 2023-03-05 MED ORDER — PREDNISONE 20 MG PO TABS
40.0000 mg | ORAL_TABLET | Freq: Once | ORAL | Status: AC
  Administered 2023-03-05: 40 mg via ORAL
  Filled 2023-03-05: qty 2

## 2023-03-05 MED ORDER — TETRACAINE HCL 0.5 % OP SOLN
1.0000 [drp] | Freq: Once | OPHTHALMIC | Status: AC
  Administered 2023-03-05: 1 [drp] via OPHTHALMIC
  Filled 2023-03-05: qty 4

## 2023-03-05 MED ORDER — PENTAFLUOROPROP-TETRAFLUOROETH EX AERO
INHALATION_SPRAY | CUTANEOUS | Status: DC | PRN
  Filled 2023-03-05: qty 30

## 2023-03-05 NOTE — Discharge Instructions (Signed)
Continue current meds.  Use aquaphor on the face for the wounds from picking.  Try not to scratch the area.  Use the freeze spray no more than 4-10sec at a time to the area that hurts no more than every 3 hours to help with pain.  You are no longer contagious

## 2023-03-05 NOTE — ED Provider Notes (Signed)
[place] EMERGENCY DEPARTMENT AT [facility] Provider Note   CSN: [id number] Arrival date & time: 03/05/23  1420     History  Chief Complaint  Patient presents with   Herpes Zoster   Pain Management    Courtney [name] is a 77 y.o. female.  Patient is a 77 year old female with multiple medical problems including chronic pain on chronic opiates but unfortunately 3 weeks ago had an episode of zoster involving the face, scalp and eye that has caused ongoing severe pain.  She has tried numerous therapies to help with the pain all which have been minimally helpful.  She is currently on valacyclovir, 600 mg gabapentin 3 times daily, oxycodone.  She has tried Lyrica, lidocaine cream, NSAIDs and Tylenol all without any help.  She is currently using drops in her eye but reports that the pain is just unrelenting and is 10 out of 10.  She feels that it is a itching stabbing type of pain that is caused her to scratch some of the skin off of her face.  She denies any change in vision.  Overall the redness of her face has improved but the pain is not going away.  The history is provided by the patient and medical records.       Home Medications Prior to Admission medications   Medication Sig Start Date End Date Taking? Authorizing Provider  predniSONE (STERAPRED UNI-PAK 21 TAB) 10 MG (21) TBPK tablet Take by mouth daily. Take 6 tabs by mouth daily  for 2 days, then 5 tabs for 2 days, then 4 tabs for 2 days, then 3 tabs for 2 days, 2 tabs for 2 days, then 1 tab by mouth daily for 2 days 03/05/23  Yes Gwyneth Sprout, MD  ALPRAZolam Prudy Feeler) 0.5 MG tablet Take 1 tablet (0.5 mg total) by mouth 2 (two) times daily as needed for anxiety. 02/24/23   [name], Fritzi Mandes, MD  amLODipine (NORVASC) 5 MG tablet Take 5 mg by mouth daily.    [provider]  D-Mannose 350 MG CAPS Take 350 mg by mouth daily.    [provider]  DULoxetine (CYMBALTA) 30 MG capsule TAKE 2 CAPSULES(60 MG) BY MOUTH  DAILY 12/26/22   [name], Kirsten, MD  furosemide (LASIX) 40 MG tablet Take 1 tablet (40 mg total) by mouth daily. 03/02/23   [name], Fritzi Mandes, MD  gabapentin (NEURONTIN) 300 MG capsule Take 1 capsule (300 mg total) by mouth 3 (three) times daily. 02/28/23 03/30/23  Windell Moment, MD  hydrOXYzine (VISTARIL) 25 MG capsule Take 2 capsules (50 mg total) by mouth every 8 (eight) hours as needed. 02/28/23   Sirivol, Kristie Cowman, MD  levothyroxine (SYNTHROID) 75 MCG tablet TAKE 1 TABLET(75 MCG) BY MOUTH DAILY 12/26/22   [name], Kirsten, MD  metoprolol tartrate (LOPRESSOR) 50 MG tablet TAKE 1 TABLET(50 MG) BY MOUTH TWICE DAILY WITH MEALS 07/31/22   [name], Kirsten, MD  Multiple Vitamins-Minerals (MULTIVITAMIN WITH MINERALS) tablet Take 1 tablet by mouth daily.    [provider]  nitroGLYCERIN (NITROSTAT) 0.4 MG SL tablet DISSOLVE ONE TABLET UNDER TONGUE AS NEEDED FOR CHEST PAIN EVERY 5 MINUTES FOR UP TO 3 DOSES 02/24/23   Sirivol, Kristie Cowman, MD  REPATHA SURECLICK 140 MG/ML SOAJ Inject 140 mg into the skin every 14 (fourteen) days.    [provider]  tirzepatide Greggory Keen) 10 MG/0.5ML Pen Inject 10 mg into the skin once a week.    [provider]  valACYclovir (VALTREX) 1000 MG tablet Take 1 tablet (  1,000 mg total) by mouth 3 (three) times daily. 03/03/23   Cardama, Amadeo Garnet, MD      Allergies    Levofloxacin, Augmentin [amoxicillin-pot clavulanate], Bactrim [sulfamethoxazole-trimethoprim], Clarithromycin, Lyrica [pregabalin], Nexletol [bempedoic acid], Statins, and Zetia [ezetimibe]    Review of Systems   Review of Systems  Physical Exam Updated Vital Signs BP (!) 106/59 (BP Location: Left Arm)   Pulse 87   Temp 98.3 F (36.8 C) (Oral)   Resp 17   Ht 5\' 5"  (1.651 m)   Wt 75.8 kg   SpO2 100%   BMI 27.81 kg/m  Physical Exam Vitals and nursing note reviewed.  Constitutional:      General: She is not in acute distress.    Appearance: She is well-developed.  HENT:     Head:  Normocephalic and atraumatic.      Comments: Staples noted in the left parietal scalp are intact with healing wound Eyes:     General: Lids are normal.     Conjunctiva/sclera:     Left eye: Left conjunctiva is not injected. No chemosis.    Pupils: Pupils are equal, round, and reactive to light.     Left eye: No corneal abrasion or fluorescein uptake.  Cardiovascular:     Rate and Rhythm: Normal rate and regular rhythm.     Heart sounds: Normal heart sounds. No murmur heard.    No friction rub.  Pulmonary:     Effort: Pulmonary effort is normal.     Breath sounds: Normal breath sounds. No wheezing or rales.  Musculoskeletal:        General: No tenderness. Normal range of motion.     Cervical back: Normal range of motion and neck supple. No tenderness.     Comments: No edema  Skin:    General: Skin is warm and dry.     Findings: No rash.  Neurological:     Mental Status: She is alert and oriented to person, place, and time.     Cranial Nerves: No cranial nerve deficit.  Psychiatric:        Behavior: Behavior normal.     ED Results / Procedures / Treatments   Labs (all labs ordered are listed, but only abnormal results are displayed) Labs Reviewed - No data to display  EKG None  Radiology No results found.  Procedures Procedures    Medications Ordered in ED Medications  pentafluoroprop-tetrafluoroeth (GEBAUERS) aerosol (has no administration in time range)  tetracaine (PONTOCAINE) 0.5 % ophthalmic solution 1 drop (has no administration in time range)  fluorescein ophthalmic strip 1 strip (has no administration in time range)  predniSONE (DELTASONE) tablet 40 mg (has no administration in time range)  HYDROmorphone (DILAUDID) injection 1 mg (1 mg Intramuscular Given 03/05/23 1539)  ketorolac (TORADOL) 30 MG/ML injection 30 mg (30 mg Intravenous Given 03/05/23 1539)    ED Course/ Medical Decision Making/ A&P                                 Medical Decision  Making Risk Prescription drug management.   Pt with multiple medical problems and comorbidities and presenting today with a complaint that caries a high risk for morbidity and mortality.  Here today with persistent post herpetic pain.  Patient is tried numerous therapies is currently seeing pain management and still reports no relief.  Will give some IM medication here for relief and try pain ease lidocaine spray  to see if that helps with the discomfort.  Also she is complaining of some pain in her left eye.  She is still using drops but will stain to ensure no new lesions or ulcers.  Patient's vision is intact.  Vital signs are reassuring.  No infectious symptoms or evidence of cellulitis at this time.  I saw the patient approximately 1 week ago for similar symptoms and today the erythema that was on her face has improved significantly.  Unfortunately it seems that patient is just having severe postherpetic pain.  No corneal lesions or abrasions with fluorescein.  Pt reports significant improvement with Pain Ease spray.  Will have pt use the spray sparingly for pain control ontop of other home meds will also try a course of prednisone as pt reports she thinks maybe that helped initially.  Encouraged pcp f/u.        Final Clinical Impression(s) / ED Diagnoses Final diagnoses:  Postherpetic neuralgia    Rx / DC Orders ED Discharge Orders          Ordered    predniSONE (STERAPRED UNI-PAK 21 TAB) 10 MG (21) TBPK tablet  Daily        03/05/23 1611              Gwyneth Sprout, MD 03/05/23 1614

## 2023-03-05 NOTE — ED Notes (Signed)
Pt received AVS; Verbalized d/c and follow up instructions. Pt and family had no further questions upon discharge.

## 2023-03-05 NOTE — ED Triage Notes (Signed)
Patient arrives with complaints of worsening pain related to shingles rash. Patient has been treated with a course of Acyclovir. She has not been able to have adequate pain relief with prescribed pain medication. Rates pain a 10/10.

## 2023-03-09 ENCOUNTER — Other Ambulatory Visit: Payer: Self-pay | Admitting: Family Medicine

## 2023-03-09 ENCOUNTER — Telehealth: Payer: Self-pay

## 2023-03-09 MED ORDER — ALPRAZOLAM 0.5 MG PO TABS: 0.5000 mg | ORAL_TABLET | Freq: Two times a day (BID) | ORAL | 0 refills | Status: DC | PRN

## 2023-03-09 NOTE — Telephone Encounter (Signed)
Patient called was crying stating she is still itching, can't sleep, feels shaky and nervous due to her shingles. Is requesting another medication be sent. Per Dr. Sedalia Muta she will send in a rx. Patient aware.

## 2023-03-10 ENCOUNTER — Ambulatory Visit (INDEPENDENT_AMBULATORY_CARE_PROVIDER_SITE_OTHER): Payer: Medicare Other | Admitting: Family Medicine

## 2023-03-10 ENCOUNTER — Encounter: Payer: Self-pay | Admitting: Family Medicine

## 2023-03-10 ENCOUNTER — Ambulatory Visit: Payer: Self-pay

## 2023-03-10 VITALS — BP 128/76 | HR 76 | Temp 97.5°F | Ht 65.0 in | Wt 166.0 lb

## 2023-03-10 DIAGNOSIS — R3915 Urgency of urination: Secondary | ICD-10-CM

## 2023-03-10 DIAGNOSIS — Z4802 Encounter for removal of sutures: Secondary | ICD-10-CM

## 2023-03-10 DIAGNOSIS — W19XXXA Unspecified fall, initial encounter: Secondary | ICD-10-CM

## 2023-03-10 DIAGNOSIS — B0229 Other postherpetic nervous system involvement: Secondary | ICD-10-CM | POA: Diagnosis not present

## 2023-03-10 DIAGNOSIS — Z043 Encounter for examination and observation following other accident: Secondary | ICD-10-CM | POA: Insufficient documentation

## 2023-03-10 LAB — POCT URINALYSIS DIP (CLINITEK)
Bilirubin, UA: NEGATIVE
Blood, UA: NEGATIVE
Glucose, UA: NEGATIVE mg/dL
Ketones, POC UA: NEGATIVE mg/dL
Leukocytes, UA: NEGATIVE
Nitrite, UA: NEGATIVE
POC PROTEIN,UA: NEGATIVE
Spec Grav, UA: 1.015 (ref 1.010–1.025)
Urobilinogen, UA: 0.2 E.U./dL
pH, UA: 6 (ref 5.0–8.0)

## 2023-03-10 MED ORDER — GABAPENTIN 300 MG PO CAPS: 300.0000 mg | ORAL_CAPSULE | Freq: Three times a day (TID) | ORAL | 0 refills | Status: DC

## 2023-03-10 NOTE — Assessment & Plan Note (Signed)
Stables removed Healing well, use triple antibiotic ointment for swelling and/or drainage as needed to the site on the back of the head. FU as needed

## 2023-03-10 NOTE — Assessment & Plan Note (Signed)
UA negative for UTI Drink plenty of water and follow up if symptoms get worse.

## 2023-03-10 NOTE — Assessment & Plan Note (Signed)
Pain well controlled  Refill sent for Gabapentin 300 mg THREE TIMES A DAY

## 2023-03-10 NOTE — Telephone Encounter (Signed)
  Chief Complaint: Left eye pain from shingles Symptoms: pain Frequency: ongoing, but much worse today Pertinent Negatives: Patient denies  Disposition: [x] ED /[] Urgent Care (no appt availability in office) / [] Appointment(In office/virtual)/ []  [facility]/ [] Home Care/ [] Refused Recommended Disposition /[]  Mobile Bus/ []  Follow-up with PCP Additional Notes: Pt is covering from shingles. Pt was told that she has 2 lesions in her left eye. Today eye starting hurting a lot. Pt has been using eye medication.  Pt/daughter will call call local EDs to find one that has an ophthalmologist on call, or in house and go there. Pt has tried to contact her Ophthalmologist without success.   Answer Assessment - Initial Assessment Questions 1. APPEARANCE of RASH: "Describe the rash."      Eye pain from shingles 2. LOCATION: "Where is the rash located?"      eye  Answer Assessment - Initial Assessment Questions 1. MECHANISM: "How did the injury happen?"      Shingles - pt has 2 lesions in her left eye 2. ONSET: "When did the injury happen?" (Minutes or hours ago)      Shingles 6. PAIN: "Is it painful?" If Yes, ask: "How bad is the pain?"   (e.g., Scale 1-10; or mild, moderate, severe)     Very painful  Protocols used: Shingles (Zoster)-A-AH, Eye Injury-A-AH

## 2023-03-10 NOTE — Progress Notes (Signed)
Subjective:  Patient ID: Courtney [name], female    DOB: [birth year]  Age: 77 y.o. MRN: [id number]  Chief Complaint  Patient presents with   Follow-up    HPI   Follow up ER visit  Patient was seen in ER for fall on 03/03/2023. She was treated for "mechanical fall just prior to arrival. Patient states she was getting out of bed to use the bathroom and got tangled up with her slippers fell to the ground hit her head. No loss of consciousness. She is not on any blood thinners." Treatment for this included CT of Head, Cervical spine performed, Chest xray which showed soft tissue swelling no acute bleeding. IV dilaudid and toradol.  She reports excellent compliance with treatment. She reports this condition is Improved.   She requests her staples to be removed today.   Urinary urgency - Patient thinks she may have a urinary tract infection. She gets them frequently and wants to be checked today. Denies fever, dysuria, or blood in the urine.  Post herpetic neuralgia - Patient requests a refill for her gabapentin as she is still having pain and burning from the shingles on her face.     12/12/2022    3:29 PM 07/06/2022    9:45 AM 04/25/2022    8:57 AM 02/08/2022    9:18 AM 11/08/2021    8:46 AM  Depression screen PHQ 2/9  Decreased Interest 3 3 0 0 1  Down, Depressed, Hopeless 3 3 0 0 1  PHQ - 2 Score 6 6 0 0 2  Altered sleeping 3 3 0 2 3  Tired, decreased energy 3 3 3 3 3   Change in appetite 0 1  0 0  Feeling bad or failure about yourself  0 3 0 2 0  Trouble concentrating 2 3 0 0 0  Moving slowly or fidgety/restless 0 0 0 0 0  Suicidal thoughts 0 0 0 0 0  PHQ-9 Score 14 19 3 7 8   Difficult doing work/chores Not difficult at all Very difficult Not difficult at all  Somewhat difficult        12/12/2022    3:29 PM  Fall Risk   Falls in the past year? 0  Number falls in past yr: 0  Injury with Fall? 0  Risk for fall due to : No Fall Risks  Follow up Falls evaluation completed     Patient Care Team: Blane Ohara, MD as PCP - General (Internal Medicine) Corky Crafts, MD as PCP - Cardiology (Cardiology) McGukin, Luvenia Redden., MD (Cardiology) Chilton Greathouse, MD as Consulting Physician (Pulmonary Disease) Zettie Pho, [facility] (Inactive) (Pharmacist)   Review of Systems  Constitutional:  Negative for chills, fatigue and fever.  HENT:  Negative for congestion, ear pain, rhinorrhea and sore throat.   Respiratory:  Negative for cough and shortness of breath.   Cardiovascular:  Negative for chest pain.  Gastrointestinal:  Negative for abdominal pain, constipation, diarrhea, nausea and vomiting.  Genitourinary:  Positive for dysuria, frequency and urgency.  Musculoskeletal:  Negative for back pain and myalgias.  Neurological:  Negative for dizziness, weakness, light-headedness and headaches.  Psychiatric/Behavioral:  Negative for dysphoric mood. The patient is not nervous/anxious.     Current Outpatient Medications on File Prior to Visit  Medication Sig Dispense Refill   ALPRAZolam (XANAX) 0.5 MG tablet Take 1 tablet (0.5 mg total) by mouth 2 (two) times daily as needed for anxiety. 60 tablet 0   amLODipine (NORVASC) 5  MG tablet Take 5 mg by mouth daily.     D-Mannose 350 MG CAPS Take 350 mg by mouth daily.     DULoxetine (CYMBALTA) 30 MG capsule TAKE 2 CAPSULES(60 MG) BY MOUTH DAILY 180 capsule 0   furosemide (LASIX) 40 MG tablet Take 1 tablet (40 mg total) by mouth daily. 30 tablet 3   levothyroxine (SYNTHROID) 75 MCG tablet TAKE 1 TABLET(75 MCG) BY MOUTH DAILY 90 tablet 1   metoprolol tartrate (LOPRESSOR) 50 MG tablet TAKE 1 TABLET(50 MG) BY MOUTH TWICE DAILY WITH MEALS 180 tablet 1   Multiple Vitamins-Minerals (MULTIVITAMIN WITH MINERALS) tablet Take 1 tablet by mouth daily.     nitroGLYCERIN (NITROSTAT) 0.4 MG SL tablet DISSOLVE ONE TABLET UNDER TONGUE AS NEEDED FOR CHEST PAIN EVERY 5 MINUTES FOR UP TO 3 DOSES 25 tablet 1   predniSONE (STERAPRED  UNI-PAK 21 TAB) 10 MG (21) TBPK tablet Take by mouth daily. Take 6 tabs by mouth daily  for 2 days, then 5 tabs for 2 days, then 4 tabs for 2 days, then 3 tabs for 2 days, 2 tabs for 2 days, then 1 tab by mouth daily for 2 days 42 tablet 0   REPATHA SURECLICK 140 MG/ML SOAJ Inject 140 mg into the skin every 14 (fourteen) days.     tirzepatide (MOUNJARO) 10 MG/0.5ML Pen Inject 10 mg into the skin once a week.     valACYclovir (VALTREX) 1000 MG tablet Take 1 tablet (1,000 mg total) by mouth 3 (three) times daily. 21 tablet 0   No current facility-administered medications on file prior to visit.   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 07/25/2014   Followed in Pulmonary clinic/ [facility]/ [place]    Cardiac dysrhythmia    Chronic idiopathic constipation    Colitis, acute 02/08/2021   Coronary artery disease    Depression, major, recurrent, moderate (HCC)    Headache    Heart murmur    History of bladder infections    History of COVID-19    Hyperlipidemia    Hypertension    Hypothyroidism    Knee pain, bilateral    Melena 02/08/2021   Osteoarthritis    Pneumonia    Primary insomnia    Recovering alcoholic (HCC)    SVT (supraventricular tachycardia)    UTI (urinary tract infection)    Varicose veins    Vitamin B12 deficiency    Past Surgical History:  Procedure Laterality Date   ABDOMINAL HYSTERECTOMY  1991   APPENDECTOMY     BACK SURGERY     had 2 surgeries. Have rods placed in back    BREAST BIOPSY Left    x2   BREAST ENHANCEMENT SURGERY  2006   CARDIAC CATHETERIZATION  12/22/2020   stents placed   CHOLECYSTECTOMY  12/2019   removed lap band.    COLONOSCOPY  12/01/2016   Moderate predominantly sigmod diverticulosis. Otherwise grossly normal colonoscopy   EYE SURGERY     IOL- bilateral - [place]   KNEE ARTHROSCOPY Left    lap band surgery  1981   LUMBAR  FUSION  07/29/2015   posterior level one   removal of cervical disc fragments  10/2007   pt. denies    TUBAL LIGATION  1970    Family History  Problem Relation Age of Onset   Heart disease Mother    Heart  disease Father    Diabetes Sister    Other Sister        BRAIN TUMOR   Heart disease Brother    Heart disease Brother    Heart disease Brother    Heart disease Brother    Heart disease Brother    Colon cancer Maternal Aunt    Colon cancer Cousin    Social History   Socioeconomic History   Marital status: Married    Spouse name: Not on file   Number of children: 2   Years of education: Not on file   Highest education level: Not on file  Occupational History   Occupation: retired    Comment: Engineer, civil (consulting)  Tobacco Use   Smoking status: Never   Smokeless tobacco: Never  Vaping Use   Vaping status: Never Used  Substance and Sexual Activity   Alcohol use: No    Alcohol/week: 0.0 standard drinks of alcohol    Comment: recovery x 20 yrs   Drug use: No   Sexual activity: Not Currently    Comment: MARRIED  Other Topics Concern   Not on file  Social History Narrative   Not on file   Social Determinants of Health   Financial Resource Strain: Low Risk  (12/12/2022)   Overall Financial Resource Strain (CARDIA)    Difficulty of Paying Living Expenses: Not hard at all  Food Insecurity: No Food Insecurity (08/01/2022)   Hunger Vital Sign    Worried About Running Out of Food in the Last Year: Never true    Ran Out of Food in the Last Year: Never true  Transportation Needs: No Transportation Needs (08/01/2022)   PRAPARE - Administrator, Civil Service (Medical): No    Lack of Transportation (Non-Medical): No  Physical Activity: Inactive (12/12/2022)   Exercise Vital Sign    Days of Exercise per Week: 0 days    Minutes of Exercise per Session: 0 min  Stress: No Stress Concern Present (12/12/2022)   [employer] of Occupational Health - Occupational Stress  Questionnaire    Feeling of Stress : Not at all  Social Connections: Moderately Isolated (12/12/2022)   Social Connection and Isolation Panel [NHANES]    Frequency of Communication with Friends and Family: Three times a week    Frequency of Social Gatherings with Friends and Family: Three times a week    Attends Religious Services: More than 4 times per year    Active Member of Clubs or Organizations: No    Attends Banker Meetings: Never    Marital Status: Widowed    Objective:  BP 128/76   Pulse 76   Temp (!) 97.5 F (36.4 C)   Ht 5\' 5"  (1.651 m)   Wt 166 lb (75.3 kg)   SpO2 96%   BMI 27.62 kg/m      03/10/2023   10:01 AM 03/05/2023    2:46 PM 03/05/2023    2:44 PM  BP/Weight  Systolic BP 128  295  Diastolic BP 76  59  Wt. (Lbs) 166 167.11   BMI 27.62 kg/m2 27.81 kg/m2     Physical Exam Vitals reviewed.  Constitutional:      Appearance: Normal appearance. She is normal weight.  Neck:     Vascular: No carotid bruit.  Cardiovascular:     Rate and Rhythm: Normal rate and regular rhythm.     Heart sounds: Normal heart sounds.  Pulmonary:     Effort: Pulmonary effort is normal. No  respiratory distress.     Breath sounds: Normal breath sounds.  Abdominal:     General: Abdomen is flat. Bowel sounds are normal.     Palpations: Abdomen is soft.     Tenderness: There is no abdominal tenderness.  Neurological:     Mental Status: She is alert and oriented to person, place, and time.  Psychiatric:        Mood and Affect: Mood normal.        Behavior: Behavior normal.     Lab Results  Component Value Date   WBC 9.8 03/03/2023   HGB 10.6 (L) 03/03/2023   HCT 32.2 (L) 03/03/2023   PLT 224 03/03/2023   GLUCOSE 88 03/03/2023   CHOL 210 (H) 12/12/2022   TRIG 181 (H) 12/12/2022   HDL 56 12/12/2022   LDLCALC 122 (H) 12/12/2022   ALT 19 03/03/2023   AST 21 03/03/2023   NA 131 (L) 03/03/2023   K 4.5 03/03/2023   CL 89 (L) 03/03/2023   CREATININE 1.37 (H)  03/03/2023   BUN 48 (H) 03/03/2023   CO2 36 (H) 03/03/2023   TSH 2.110 03/02/2023   HGBA1C 5.6 12/12/2022   Suture Removal  Date/Time: 03/10/2023 10:45 AM  Performed by: Renne Crigler, FNP Authorized by: Renne Crigler, FNP  Body area: head/neck Location details: scalp Wound Appearance: clean Staples Removed: 4       Assessment & Plan:    Encounter for post fall examination Assessment & Plan: Stables removed Healing well, use triple antibiotic ointment for swelling and/or drainage as needed to the site on the back of the head. FU as needed   Urinary urgency Assessment & Plan: UA negative for UTI Drink plenty of water and follow up if symptoms get worse.   Orders: -     POCT URINALYSIS DIP (CLINITEK)  Post herpetic neuralgia Assessment & Plan: Pain well controlled  Refill sent for Gabapentin 300 mg THREE TIMES A DAY   Orders: -     Gabapentin; Take 1 capsule (300 mg total) by mouth 3 (three) times daily.  Dispense: 90 capsule; Refill: 0     Meds ordered this encounter  Medications   gabapentin (NEURONTIN) 300 MG capsule    Sig: Take 1 capsule (300 mg total) by mouth 3 (three) times daily.    Dispense:  90 capsule    Refill:  0    Dose increase to 300 mg three times daily    Orders Placed This Encounter  Procedures   POCT URINALYSIS DIP (CLINITEK)     Follow-up: Return if symptoms worsen or fail to improve.   I,Katherina A Bramblett,acting as a scribe for Renne Crigler, FNP.,have documented all relevant documentation on the behalf of Renne Crigler, FNP,as directed by  Renne Crigler, FNP while in the presence of Renne Crigler, FNP.   An After Visit Summary was printed and given to the patient.  Total time spent on today's visit was greater than 30 minutes, including both face-to-face time and nonface-to-face time personally spent on review of chart (labs and imaging), discussing labs and goals, discussing further work-up, treatment options, referrals  to specialist if needed, reviewing outside records if pertinent, answering patient's questions, and coordinating care.   Renne Crigler, FNP [name] [phone]

## 2023-03-11 DIAGNOSIS — B0229 Other postherpetic nervous system involvement: Secondary | ICD-10-CM | POA: Diagnosis not present

## 2023-03-12 ENCOUNTER — Encounter: Payer: Self-pay | Admitting: Family Medicine

## 2023-03-13 ENCOUNTER — Other Ambulatory Visit: Payer: Self-pay

## 2023-03-13 DIAGNOSIS — B0229 Other postherpetic nervous system involvement: Secondary | ICD-10-CM

## 2023-03-13 DIAGNOSIS — R7303 Prediabetes: Secondary | ICD-10-CM

## 2023-03-13 DIAGNOSIS — E663 Overweight: Secondary | ICD-10-CM

## 2023-03-13 MED ORDER — DULOXETINE HCL 30 MG PO CPEP: 30.0000 mg | ORAL_CAPSULE | Freq: Two times a day (BID) | ORAL | 0 refills | Status: DC

## 2023-03-13 MED ORDER — GABAPENTIN 300 MG PO CAPS: 300.0000 mg | ORAL_CAPSULE | Freq: Three times a day (TID) | ORAL | 0 refills | Status: DC

## 2023-03-14 ENCOUNTER — Other Ambulatory Visit: Payer: Self-pay

## 2023-03-14 ENCOUNTER — Telehealth: Payer: Self-pay

## 2023-03-14 ENCOUNTER — Emergency Department (HOSPITAL_BASED_OUTPATIENT_CLINIC_OR_DEPARTMENT_OTHER)
Admission: EM | Admit: 2023-03-14 | Discharge: 2023-03-14 | Disposition: A | Discharge status: Home / Self Care | Payer: Medicare Other | Attending: Emergency Medicine | Admitting: Emergency Medicine

## 2023-03-14 ENCOUNTER — Emergency Department (HOSPITAL_BASED_OUTPATIENT_CLINIC_OR_DEPARTMENT_OTHER): Payer: Medicare Other

## 2023-03-14 DIAGNOSIS — M542 Cervicalgia: Secondary | ICD-10-CM | POA: Diagnosis not present

## 2023-03-14 DIAGNOSIS — B0229 Other postherpetic nervous system involvement: Secondary | ICD-10-CM

## 2023-03-14 DIAGNOSIS — M546 Pain in thoracic spine: Secondary | ICD-10-CM | POA: Insufficient documentation

## 2023-03-14 DIAGNOSIS — B0222 Postherpetic trigeminal neuralgia: Secondary | ICD-10-CM | POA: Diagnosis not present

## 2023-03-14 DIAGNOSIS — R519 Headache, unspecified: Secondary | ICD-10-CM

## 2023-03-14 DIAGNOSIS — S0990XA Unspecified injury of head, initial encounter: Secondary | ICD-10-CM | POA: Diagnosis not present

## 2023-03-14 DIAGNOSIS — I6782 Cerebral ischemia: Secondary | ICD-10-CM | POA: Diagnosis not present

## 2023-03-14 DIAGNOSIS — E663 Overweight: Secondary | ICD-10-CM

## 2023-03-14 DIAGNOSIS — R7303 Prediabetes: Secondary | ICD-10-CM

## 2023-03-14 MED ORDER — DULOXETINE HCL 60 MG PO CPEP: 60.0000 mg | ORAL_CAPSULE | Freq: Two times a day (BID) | ORAL | 2 refills | Status: DC

## 2023-03-14 MED ORDER — GABAPENTIN 300 MG PO CAPS: 300.0000 mg | ORAL_CAPSULE | Freq: Three times a day (TID) | ORAL | 2 refills | Status: DC

## 2023-03-14 MED ORDER — GABAPENTIN 300 MG PO CAPS: 300.0000 mg | ORAL_CAPSULE | Freq: Three times a day (TID) | ORAL | 0 refills | Status: DC

## 2023-03-14 MED ORDER — KETOROLAC TROMETHAMINE 15 MG/ML IJ SOLN
30.0000 mg | Freq: Once | INTRAMUSCULAR | Status: AC
  Administered 2023-03-14: 30 mg via INTRAMUSCULAR
  Filled 2023-03-14: qty 2

## 2023-03-14 MED ORDER — DULOXETINE HCL 30 MG PO CPEP
60.0000 mg | ORAL_CAPSULE | Freq: Two times a day (BID) | ORAL | 2 refills | Status: DC
Start: 2023-03-14 — End: 2023-03-14

## 2023-03-14 NOTE — Telephone Encounter (Signed)
Courtney [name] called with concerns about her Mom.  She is experiencing headache and facial numbness and tingling at he shingles site.  Her staples have been removed following her fall and everything seems to be healing nicely but she continues to have headache and pain.  The daughter is concerned that she could have a brain bleed from her fall.  Her CT scan when in the ED for negative.  Dr. Sedalia Muta advised that she follow-up at the ED for evalution and tx.

## 2023-03-14 NOTE — ED Provider Notes (Signed)
[place] EMERGENCY DEPARTMENT AT [facility] Provider Note   CSN: [id number] Arrival date & time: 03/14/23  1652     History  Chief Complaint  Patient presents with   Headache   Herpes Zoster    Courtney [name] is a 77 y.o. female.  Patient presents to the emergency department today for evaluation of right-sided facial pain and headache with radiation into the neck.  Patient developed shingles about 4 weeks ago.  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  She has been on oral pain medication and gabapentin for this.  Patient has been taking the gabapentin but states that a makes her "feel drunk all the time".  Her pain has been difficult to control.  She also has intense itching around her eye and face.  She was most recently seen by ophthalmology 3 days ago and told that her corneas are clear.  Patient had a fall around 9/13, was seen in the ED and had imaging of the head and cervical spine which were negative.  She had a subsequent visit for her facial pain.  She has also seen PCP and had staples removed from her scalp.  Given worsening headache, they recommended that she come in to have reimaging done today to ensure no bleeding.       Home Medications Prior to Admission medications   Medication Sig Start Date End Date Taking? Authorizing Provider  ALPRAZolam Prudy Feeler) 0.5 MG tablet Take 1 tablet (0.5 mg total) by mouth 2 (two) times daily as needed for anxiety. 03/09/23   [name], Fritzi Mandes, MD  amLODipine (NORVASC) 5 MG tablet Take 5 mg by mouth daily.    [provider]  D-Mannose 350 MG CAPS Take 350 mg by mouth daily.    [provider]  DULoxetine (CYMBALTA) 60 MG capsule Take 1 capsule (60 mg total) by mouth 2 (two) times daily. 03/14/23   [name], Fritzi Mandes, MD  furosemide (LASIX) 40 MG tablet Take 1 tablet (40 mg total) by mouth daily. 03/02/23   [name], Fritzi Mandes, MD  gabapentin (NEURONTIN) 300 MG capsule Take 1 capsule (300 mg total)  by mouth 3 (three) times daily. 03/14/23   [name], Fritzi Mandes, MD  levothyroxine (SYNTHROID) 75 MCG tablet TAKE 1 TABLET(75 MCG) BY MOUTH DAILY 12/26/22   [name], Kirsten, MD  metoprolol tartrate (LOPRESSOR) 50 MG tablet TAKE 1 TABLET(50 MG) BY MOUTH TWICE DAILY WITH MEALS 07/31/22   [name], Kirsten, MD  Multiple Vitamins-Minerals (MULTIVITAMIN WITH MINERALS) tablet Take 1 tablet by mouth daily.    [provider]  nitroGLYCERIN (NITROSTAT) 0.4 MG SL tablet DISSOLVE ONE TABLET UNDER TONGUE AS NEEDED FOR CHEST PAIN EVERY 5 MINUTES FOR UP TO 3 DOSES 02/24/23   Sirivol, Kristie Cowman, MD  predniSONE (STERAPRED UNI-PAK 21 TAB) 10 MG (21) TBPK tablet Take by mouth daily. Take 6 tabs by mouth daily  for 2 days, then 5 tabs for 2 days, then 4 tabs for 2 days, then 3 tabs for 2 days, 2 tabs for 2 days, then 1 tab by mouth daily for 2 days 03/05/23   Gwyneth Sprout, MD  REPATHA SURECLICK 140 MG/ML SOAJ Inject 140 mg into the skin every 14 (fourteen) days.    [provider]  tirzepatide Greggory Keen) 10 MG/0.5ML Pen Inject 10 mg into the skin once a week.    [provider]  valACYclovir (VALTREX) 1000 MG tablet Take 1 tablet (1,000 mg total) by mouth 3 (three) times daily. 03/03/23   Cardama, Amadeo Garnet,  MD      Allergies    Levofloxacin, Augmentin [amoxicillin-pot clavulanate], Bactrim [sulfamethoxazole-trimethoprim], Clarithromycin, Hydroxyzine, Lyrica [pregabalin], Nexletol [bempedoic acid], Statins, and Zetia [ezetimibe]    Review of Systems   Review of Systems  Physical Exam Updated Vital Signs BP 103/62   Pulse 84   Resp 18   Ht 5\' 5"  (1.651 m)   Wt 75 kg   SpO2 94%   BMI 27.51 kg/m   Physical Exam Vitals and nursing note reviewed.  Constitutional:      General: She is not in acute distress.    Appearance: She is well-developed.  HENT:     Head: Normocephalic and atraumatic.     Comments: Mild erythema about the left orbit, no new lesions.    Right Ear: External ear normal.      Left Ear: External ear normal.     Nose: Nose normal.  Eyes:     Conjunctiva/sclera: Conjunctivae normal.  Cardiovascular:     Rate and Rhythm: Normal rate and regular rhythm.     Heart sounds: No murmur heard. Pulmonary:     Effort: No respiratory distress.     Breath sounds: No wheezing, rhonchi or rales.  Abdominal:     Palpations: Abdomen is soft.     Tenderness: There is no abdominal tenderness. There is no guarding or rebound.  Musculoskeletal:     Cervical back: Normal range of motion and neck supple. Tenderness present.     Thoracic back: Tenderness present.     Right lower leg: No edema.     Left lower leg: No edema.  Skin:    General: Skin is warm and dry.     Findings: No rash.  Neurological:     General: No focal deficit present.     Mental Status: She is alert. Mental status is at baseline.     Motor: No weakness.  Psychiatric:        Mood and Affect: Mood normal.     ED Results / Procedures / Treatments   Labs (all labs ordered are listed, but only abnormal results are displayed) Labs Reviewed - No data to display  EKG None  Radiology No results found.  Procedures Procedures    Medications Ordered in ED Medications  ketorolac (TORADOL) 15 MG/ML injection 30 mg (has no administration in time range)    ED Course/ Medical Decision Making/ A&P    Patient seen and examined. History obtained directly from patient. Reviewed ED visits over the past several months.   Labs/EKG: None ordered  Imaging: Ordered CT head.  Medications/Fluids: Ordered: IM Toradol  Most recent vital signs reviewed and are as follows: BP 103/62   Pulse 84   Resp 18   Ht 5\' 5"  (1.651 m)   Wt 75 kg   SpO2 94%   BMI 27.51 kg/m   Initial impression: Postherpetic neuralgia  7:25 PM patient discussed with and seen by Dr. Silverio Lay.  Imaging negative.  Cleared for discharge to home.  Encouraged follow-up with PCP/pain management.  Imaging personally visualized and interpreted  including: CT head, agree no acute findings.  Most current vital signs reviewed and are as follows: BP 103/62   Pulse 84   Resp 18   Ht 5\' 5"  (1.651 m)   Wt 75 kg   SpO2 94%   BMI 27.51 kg/m   Plan: Discharge to home.   Prescriptions written for: None  Other home care instructions discussed: Continue chronic home pain medications and gabapentin  ED return instructions discussed: New or worsening symptoms  Follow-up instructions discussed: Patient encouraged to follow-up with their PCP in 7 days.                                  Medical Decision Making Amount and/or Complexity of Data Reviewed Radiology: ordered.  Risk Prescription drug management.   Patient with left-sided orbital and facial pain and itching stemming from recent bout of shingles.  Patient has a lot of referred pain into her neck and posterior head and scalp area.  No new lesions or active shingles suspected.  Head CT was performed as a precaution given her recent head injury and no accumulation of blood noted or subdural hematoma.  A lot of this seems to be neuropathic from postherpetic neuralgia.  Patient will continue current therapy, although unfortunately her symptoms have been fairly uncontrolled.  No indication for further evaluation or workup here.        Final Clinical Impression(s) / ED Diagnoses Final diagnoses:  Post herpetic neuralgia  Acute nonintractable headache, unspecified headache type    Rx / DC Orders ED Discharge Orders     None         Renne Crigler, PA-C 03/14/23 1927    Charlynne Pander, MD 03/14/23 2322

## 2023-03-14 NOTE — ED Triage Notes (Signed)
Pt POV from home reporting headache, seen 9/13 after fall, stitches posterior head, had stitches removed 9/20, persistent headache since. Also reports shingles flareup x4 weeks seen for same as well.

## 2023-03-14 NOTE — Discharge Instructions (Signed)
Please read and follow all provided instructions.  Your diagnoses today include:  1. Post herpetic neuralgia   2. Acute nonintractable headache, unspecified headache type     Tests performed today include: CT scan of your head that did not show any serious injury. Vital signs. See below for your results today.   Medications prescribed:  None  Take any prescribed medications only as directed.  Home care instructions:  Follow any educational materials contained in this packet.  Please continue your home pain medications and gabapentin.  Follow-up instructions: Please follow-up with your primary care provider/pain management in the next 7 days for further evaluation of your symptoms.   Return instructions:  SEEK IMMEDIATE MEDICAL ATTENTION IF: There is confusion or drowsiness (although children frequently become drowsy after injury).  You cannot awaken the injured person.  You have more than one episode of vomiting.  You notice dizziness or unsteadiness which is getting worse, or inability to walk.  You have convulsions or unconsciousness.  You experience severe, persistent headaches not relieved by Tylenol. You cannot use arms or legs normally.  There are changes in pupil sizes. (This is the black center in the colored part of the eye)  There is clear or bloody discharge from the nose or ears.  You have change in speech, vision, swallowing, or understanding.  Localized weakness, numbness, tingling, or change in bowel or bladder control. You have any other emergent concerns.  Additional Information: You have had a head injury which does not appear to require admission at this time.  Your vital signs today were: BP 103/62   Pulse 84   Resp 18   Ht 5\' 5"  (1.651 m)   Wt 75 kg   SpO2 94%   BMI 27.51 kg/m  If your blood pressure (BP) was elevated above 135/85 this visit, please have this repeated by your doctor within one month. --------------

## 2023-03-15 DIAGNOSIS — Z961 Presence of intraocular lens: Secondary | ICD-10-CM | POA: Diagnosis not present

## 2023-03-15 DIAGNOSIS — H18413 Arcus senilis, bilateral: Secondary | ICD-10-CM | POA: Diagnosis not present

## 2023-03-15 DIAGNOSIS — D3131 Benign neoplasm of right choroid: Secondary | ICD-10-CM | POA: Diagnosis not present

## 2023-03-15 DIAGNOSIS — H04123 Dry eye syndrome of bilateral lacrimal glands: Secondary | ICD-10-CM | POA: Diagnosis not present

## 2023-03-15 DIAGNOSIS — H43393 Other vitreous opacities, bilateral: Secondary | ICD-10-CM | POA: Diagnosis not present

## 2023-03-15 DIAGNOSIS — B0239 Other herpes zoster eye disease: Secondary | ICD-10-CM | POA: Diagnosis not present

## 2023-03-15 DIAGNOSIS — H532 Diplopia: Secondary | ICD-10-CM | POA: Diagnosis not present

## 2023-03-20 ENCOUNTER — Encounter: Payer: Self-pay | Admitting: Family Medicine

## 2023-03-20 ENCOUNTER — Ambulatory Visit (INDEPENDENT_AMBULATORY_CARE_PROVIDER_SITE_OTHER): Payer: Medicare Other | Admitting: Family Medicine

## 2023-03-20 VITALS — BP 130/70 | HR 73 | Temp 97.2°F | Ht 65.0 in | Wt 159.0 lb

## 2023-03-20 DIAGNOSIS — F331 Major depressive disorder, recurrent, moderate: Secondary | ICD-10-CM | POA: Insufficient documentation

## 2023-03-20 DIAGNOSIS — B0229 Other postherpetic nervous system involvement: Secondary | ICD-10-CM | POA: Diagnosis not present

## 2023-03-20 DIAGNOSIS — N3000 Acute cystitis without hematuria: Secondary | ICD-10-CM | POA: Diagnosis not present

## 2023-03-20 LAB — POCT URINALYSIS DIP (CLINITEK)
Bilirubin, UA: NEGATIVE
Blood, UA: NEGATIVE
Glucose, UA: NEGATIVE mg/dL
Ketones, POC UA: NEGATIVE mg/dL
Nitrite, UA: POSITIVE — AB
POC PROTEIN,UA: NEGATIVE
Spec Grav, UA: 1.015 (ref 1.010–1.025)
Urobilinogen, UA: 0.2 U/dL
pH, UA: 6.5 (ref 5.0–8.0)

## 2023-03-20 MED ORDER — GABAPENTIN 100 MG PO CAPS
100.0000 mg | ORAL_CAPSULE | Freq: Three times a day (TID) | ORAL | 3 refills | Status: DC
Start: 2023-03-20 — End: 2023-04-24

## 2023-03-20 MED ORDER — CIPROFLOXACIN HCL 500 MG PO TABS
500.0000 mg | ORAL_TABLET | Freq: Two times a day (BID) | ORAL | 0 refills | Status: AC
Start: 2023-03-20 — End: 2023-03-27

## 2023-03-20 NOTE — Progress Notes (Signed)
Acute Office Visit  Subjective:    Patient ID: Courtney [name], female    DOB: 08/13/1945, 77 y.o.   MRN: [id number]  Chief Complaint  Patient presents with   Urinary Tract Infection    HPI: Patient is in today for lower abdominal pain, some dysuria, and urinary frequency. Denies fever or nausea. Noticed symptoms started last Thursday.  C/o of itching and headaches due to her shingles.  Postherpetic neuralgia is still causing a lot of pain. Per the patient her ophthalmologist put her on valtrex prophylaxis. She is only taking gabapentin 100 mg daily because it made her sleepy. She did not tolerate lyrica at all.   Patient is very depressed due to the pain over the last 5 weeks and the death of her husband in the last 6 months.      03/20/2023   11:25 AM 12/12/2022    3:29 PM 07/06/2022    9:45 AM  PHQ9 SCORE ONLY  PHQ-9 Total Score 13 14 19     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 07/25/2014   Followed in Pulmonary clinic/ [facility]/ [place]    Cardiac dysrhythmia    Chronic idiopathic constipation    Colitis, acute 02/08/2021   Coronary artery disease    Depression, major, recurrent, moderate (HCC)    Headache    Heart murmur    History of bladder infections    History of COVID-19    Hyperlipidemia    Hypertension    Hypothyroidism    Knee pain, bilateral    Melena 02/08/2021   Osteoarthritis    Pneumonia    Primary insomnia    Recovering alcoholic (HCC)    SVT (supraventricular tachycardia)    UTI (urinary tract infection)    Varicose veins    Vitamin B12 deficiency     Past Surgical History:  Procedure Laterality Date   ABDOMINAL HYSTERECTOMY  1991   APPENDECTOMY     BACK SURGERY     had 2 surgeries. Have rods placed in back    BREAST BIOPSY Left    x2   BREAST ENHANCEMENT SURGERY  2006   CARDIAC CATHETERIZATION  12/22/2020   stents  placed   CHOLECYSTECTOMY  12/2019   removed lap band.    COLONOSCOPY  12/01/2016   Moderate predominantly sigmod diverticulosis. Otherwise grossly normal colonoscopy   EYE SURGERY     IOL- bilateral - [place]   KNEE ARTHROSCOPY Left    lap band surgery  1981   LUMBAR FUSION  07/29/2015   posterior level one   removal of cervical disc fragments  10/2007   pt. denies    TUBAL LIGATION  1970    Family History  Problem Relation Age of Onset   Heart disease Mother    Heart disease Father    Diabetes Sister    Other Sister        BRAIN TUMOR   Heart disease Brother    Heart disease Brother    Heart disease Brother    Heart disease Brother    Heart disease Brother    Colon cancer Maternal Aunt    Colon cancer Cousin     Social History   Socioeconomic History   Marital status: Married    Spouse name: Not on file   Number of children: 2   Years of education: Not on file  Highest education level: Not on file  Occupational History   Occupation: retired    Comment: Engineer, civil (consulting)  Tobacco Use   Smoking status: Never   Smokeless tobacco: Never  Vaping Use   Vaping status: Never Used  Substance and Sexual Activity   Alcohol use: No    Alcohol/week: 0.0 standard drinks of alcohol    Comment: recovery x 20 yrs   Drug use: No   Sexual activity: Not Currently    Comment: MARRIED  Other Topics Concern   Not on file  Social History Narrative   Not on file   Social Determinants of Health   Financial Resource Strain: Low Risk  (12/12/2022)   Overall Financial Resource Strain (CARDIA)    Difficulty of Paying Living Expenses: Not hard at all  Food Insecurity: No Food Insecurity (08/01/2022)   Hunger Vital Sign    Worried About Running Out of Food in the Last Year: Never true    Ran Out of Food in the Last Year: Never true  Transportation Needs: No Transportation Needs (08/01/2022)   PRAPARE - Administrator, Civil Service (Medical): No    Lack of Transportation  (Non-Medical): No  Physical Activity: Inactive (12/12/2022)   Exercise Vital Sign    Days of Exercise per Week: 0 days    Minutes of Exercise per Session: 0 min  Stress: No Stress Concern Present (12/12/2022)   [employer] of Occupational Health - Occupational Stress Questionnaire    Feeling of Stress : Not at all  Social Connections: Moderately Isolated (12/12/2022)   Social Connection and Isolation Panel [NHANES]    Frequency of Communication with Friends and Family: Three times a week    Frequency of Social Gatherings with Friends and Family: Three times a week    Attends Religious Services: More than 4 times per year    Active Member of Clubs or Organizations: No    Attends Banker Meetings: Never    Marital Status: Widowed  Intimate Partner Violence: Not At Risk (12/12/2022)   Humiliation, Afraid, Rape, and Kick questionnaire    Fear of Current or Ex-Partner: No    Emotionally Abused: No    Physically Abused: No    Sexually Abused: No    Outpatient Medications Prior to Visit  Medication Sig Dispense Refill   ALPRAZolam (XANAX) 0.5 MG tablet Take 1 tablet (0.5 mg total) by mouth 2 (two) times daily as needed for anxiety. 60 tablet 0   amLODipine (NORVASC) 5 MG tablet Take 5 mg by mouth daily.     D-Mannose 350 MG CAPS Take 350 mg by mouth daily.     DULoxetine (CYMBALTA) 60 MG capsule Take 1 capsule (60 mg total) by mouth 2 (two) times daily. 30 capsule 2   furosemide (LASIX) 40 MG tablet Take 1 tablet (40 mg total) by mouth daily. 30 tablet 3   levothyroxine (SYNTHROID) 75 MCG tablet TAKE 1 TABLET(75 MCG) BY MOUTH DAILY 90 tablet 1   metoprolol tartrate (LOPRESSOR) 50 MG tablet TAKE 1 TABLET(50 MG) BY MOUTH TWICE DAILY WITH MEALS 180 tablet 1   Multiple Vitamins-Minerals (MULTIVITAMIN WITH MINERALS) tablet Take 1 tablet by mouth daily.     nitroGLYCERIN (NITROSTAT) 0.4 MG SL tablet DISSOLVE ONE TABLET UNDER TONGUE AS NEEDED FOR CHEST PAIN EVERY 5 MINUTES FOR UP  TO 3 DOSES 25 tablet 1   predniSONE (STERAPRED UNI-PAK 21 TAB) 10 MG (21) TBPK tablet Take by mouth daily. Take 6 tabs by mouth daily  for 2 days, then 5 tabs for 2 days, then 4 tabs for 2 days, then 3 tabs for 2 days, 2 tabs for 2 days, then 1 tab by mouth daily for 2 days 42 tablet 0   REPATHA SURECLICK 140 MG/ML SOAJ Inject 140 mg into the skin every 14 (fourteen) days.     tirzepatide (MOUNJARO) 10 MG/0.5ML Pen Inject 10 mg into the skin once a week.     valACYclovir (VALTREX) 1000 MG tablet Take 1 tablet (1,000 mg total) by mouth 3 (three) times daily. 21 tablet 0   gabapentin (NEURONTIN) 300 MG capsule Take 1 capsule (300 mg total) by mouth 3 (three) times daily. 90 capsule 2   No facility-administered medications prior to visit.    Allergies  Allergen Reactions   Levofloxacin Hives, Shortness Of Breath, Swelling and Other (See Comments)    RASH IN MOUTH   (can take IV route)   Augmentin [Amoxicillin-Pot Clavulanate] Nausea Only   Bactrim [Sulfamethoxazole-Trimethoprim] Hives and Itching   Clarithromycin     GI upset Other reaction(s): GI Upset (intolerance) GI upset -can take but does cause mild upset    Hydroxyzine Swelling   Lyrica [Pregabalin]    Nexletol [Bempedoic Acid]     Muscle pain   Statins Other (See Comments)    Myalgias   Zetia [Ezetimibe]     Muscle pain    Review of Systems  Constitutional:  Negative for chills and fever.  HENT:  Negative for congestion and sore throat.   Respiratory:  Negative for cough and shortness of breath.   Cardiovascular:  Negative for chest pain.  Gastrointestinal:  Positive for abdominal pain. Negative for nausea.  Genitourinary:  Negative for dysuria, flank pain, frequency and urgency.  Musculoskeletal:  Negative for back pain.       Objective:        03/20/2023   11:21 AM 03/14/2023    7:26 PM 03/14/2023    5:19 PM  Vitals with BMI  Height 5\' 5"     Weight 159 lbs    BMI 26.46    Systolic 130 119 161  Diastolic 70 65  62  Pulse 73 72 84    No data found.   Physical Exam Vitals reviewed.  Constitutional:      Appearance: Normal appearance. She is normal weight.  Neck:     Vascular: No carotid bruit.  Cardiovascular:     Rate and Rhythm: Normal rate and regular rhythm.     Heart sounds: Normal heart sounds.  Pulmonary:     Effort: Pulmonary effort is normal. No respiratory distress.     Breath sounds: Normal breath sounds.  Abdominal:     General: Abdomen is flat. Bowel sounds are normal.     Palpations: Abdomen is soft.     Tenderness: There is abdominal tenderness (LOWER).  Neurological:     Mental Status: She is alert and oriented to person, place, and time.  Psychiatric:        Mood and Affect: Mood normal.        Behavior: Behavior normal.    Health Maintenance Due  Topic Date Due   Zoster Vaccines- Shingrix (1 of 2) [birth year]/1965   Colonoscopy  12/01/2021   INFLUENZA VACCINE  01/19/2023   Medicare Annual Wellness (AWV)  04/29/2023    There are no preventive care reminders to display for this patient.   Lab Results  Component Value Date   TSH 2.110 03/02/2023   Lab Results  Component Value Date   WBC 9.8 03/03/2023   HGB 10.6 (L) 03/03/2023   HCT 32.2 (L) 03/03/2023   MCV 97.9 03/03/2023   PLT 224 03/03/2023   Lab Results  Component Value Date   NA 131 (L) 03/03/2023   K 4.5 03/03/2023   CO2 36 (H) 03/03/2023   GLUCOSE 88 03/03/2023   BUN 48 (H) 03/03/2023   CREATININE 1.37 (H) 03/03/2023   BILITOT 0.4 03/03/2023   ALKPHOS 83 03/03/2023   AST 21 03/03/2023   ALT 19 03/03/2023   PROT 6.6 03/03/2023   ALBUMIN 3.7 03/03/2023   CALCIUM 9.0 03/03/2023   ANIONGAP 6 03/03/2023   EGFR 53 (L) 03/02/2023   Lab Results  Component Value Date   CHOL 210 (H) 12/12/2022   Lab Results  Component Value Date   HDL 56 12/12/2022   Lab Results  Component Value Date   LDLCALC 122 (H) 12/12/2022   Lab Results  Component Value Date   TRIG 181 (H) 12/12/2022   Lab  Results  Component Value Date   CHOLHDL 3.8 12/12/2022   Lab Results  Component Value Date   HGBA1C 5.6 12/12/2022       Assessment & Plan:  Acute cystitis without hematuria Assessment & Plan: Started on cipro.   Orders: -     POCT URINALYSIS DIP (CLINITEK) -     Urine Culture -     Ciprofloxacin HCl; Take 1 tablet (500 mg total) by mouth 2 (two) times daily for 7 days.  Dispense: 14 tablet; Refill: 0  Post herpetic neuralgia Assessment & Plan: Continue current management.   Orders: -     Gabapentin; Take 1 capsule (100 mg total) by mouth 3 (three) times daily.  Dispense: 90 capsule; Refill: 3  Moderate recurrent major depression (HCC) Assessment & Plan: Recently increased duloxetine.  Not suicidal.  Recommended grief counseling through hospice.       Meds ordered this encounter  Medications   ciprofloxacin (CIPRO) 500 MG tablet    Sig: Take 1 tablet (500 mg total) by mouth 2 (two) times daily for 7 days.    Dispense:  14 tablet    Refill:  0    TOLERATES DESPITE LEVAQUIN ALLERGY.   gabapentin (NEURONTIN) 100 MG capsule    Sig: Take 1 capsule (100 mg total) by mouth 3 (three) times daily.    Dispense:  90 capsule    Refill:  3    Orders Placed This Encounter  Procedures   Urine Culture   POCT URINALYSIS DIP (CLINITEK)     Follow-up: No follow-ups on file.  An After Visit Summary was printed and given to the patient.  Blane Ohara, MD [facility] [phone]

## 2023-03-20 NOTE — Assessment & Plan Note (Signed)
Continue current management .

## 2023-03-20 NOTE — Assessment & Plan Note (Signed)
Started on cipro

## 2023-03-20 NOTE — Assessment & Plan Note (Signed)
Recently increased duloxetine.  Not suicidal.  Recommended grief counseling through hospice.

## 2023-03-25 LAB — URINE CULTURE

## 2023-03-30 DIAGNOSIS — B0223 Postherpetic polyneuropathy: Secondary | ICD-10-CM | POA: Diagnosis not present

## 2023-03-30 NOTE — Assessment & Plan Note (Signed)
Check labs 

## 2023-03-30 NOTE — Assessment & Plan Note (Signed)
Well controlled.  No changes to medicines. Metoprolol tartrate 50 mg twice daily, on lisinopril 5 mg daily. Continue to work on eating a healthy diet and exercise.  Labs drawn today.

## 2023-03-30 NOTE — Assessment & Plan Note (Signed)
Poorly controlled. Currently on  repatha Continue to work on eating a healthy diet and exercise.  Labs drawn today.

## 2023-03-30 NOTE — Assessment & Plan Note (Signed)
The current medical regimen is effective;  continue present plan and medications.  Duloxetine 60 mg daily 

## 2023-03-30 NOTE — Assessment & Plan Note (Signed)
Previously well controlled Continue Synthroid at current dose  Recheck TSH and adjust Synthroid as indicated   

## 2023-03-30 NOTE — Assessment & Plan Note (Signed)
Continue mounjaro 10 mg weekly.  Continue to eat healthy.

## 2023-03-30 NOTE — Progress Notes (Deleted)
Subjective:  Patient ID: Courtney [name], female    DOB: 02/02/1946  Age: 77 y.o. MRN: [id number]  No chief complaint on file.   HPI   HTN: Norvasc 5 mg daily, Metoprolol 50 mg daily, Lasix 20 mg daily  Hypothyroidism: Synthroid 75 mcg daily  Depression: Cymbalta 30 mg daily, Xanax as needed. Spouse passed away 11-14-22.  Cholesterol: Repatha 140 mg every 14 days.     03/20/2023   11:25 AM 12/12/2022    3:29 PM 07/06/2022    9:45 AM 04/25/2022    8:57 AM 02/08/2022    9:18 AM  Depression screen PHQ 2/9  Decreased Interest 3 3 3  0 0  Down, Depressed, Hopeless 3 3 3  0 0  PHQ - 2 Score 6 6 6  0 0  Altered sleeping 3 3 3  0 2  Tired, decreased energy 3 3 3 3 3   Change in appetite 0 0 1  0  Feeling bad or failure about yourself  0 0 3 0 2  Trouble concentrating 1 2 3  0 0  Moving slowly or fidgety/restless 0 0 0 0 0  Suicidal thoughts 0 0 0 0 0  PHQ-9 Score 13 14 19 3 7   Difficult doing work/chores Somewhat difficult Not difficult at all Very difficult Not difficult at all         12/12/2022    3:29 PM  Fall Risk   Falls in the past year? 0  Number falls in past yr: 0  Injury with Fall? 0  Risk for fall due to : No Fall Risks  Follow up Falls evaluation completed    Patient Care Team: Blane Ohara, MD as PCP - General (Internal Medicine) Corky Crafts, MD as PCP - Cardiology (Cardiology) McGukin, Luvenia Redden., MD (Cardiology) Chilton Greathouse, MD as Consulting Physician (Pulmonary Disease) Zettie Pho, [facility] (Inactive) (Pharmacist)   Review of Systems  Current Outpatient Medications on File Prior to Visit  Medication Sig Dispense Refill   ALPRAZolam (XANAX) 0.5 MG tablet Take 1 tablet (0.5 mg total) by mouth 2 (two) times daily as needed for anxiety. 60 tablet 0   amLODipine (NORVASC) 5 MG tablet Take 5 mg by mouth daily.     D-Mannose 350 MG CAPS Take 350 mg by mouth daily.     DULoxetine (CYMBALTA) 60 MG capsule Take 1 capsule (60 mg total) by mouth 2 (two)  times daily. 30 capsule 2   furosemide (LASIX) 40 MG tablet Take 1 tablet (40 mg total) by mouth daily. 30 tablet 3   gabapentin (NEURONTIN) 100 MG capsule Take 1 capsule (100 mg total) by mouth 3 (three) times daily. 90 capsule 3   levothyroxine (SYNTHROID) 75 MCG tablet TAKE 1 TABLET(75 MCG) BY MOUTH DAILY 90 tablet 1   metoprolol tartrate (LOPRESSOR) 50 MG tablet TAKE 1 TABLET(50 MG) BY MOUTH TWICE DAILY WITH MEALS 180 tablet 1   Multiple Vitamins-Minerals (MULTIVITAMIN WITH MINERALS) tablet Take 1 tablet by mouth daily.     nitroGLYCERIN (NITROSTAT) 0.4 MG SL tablet DISSOLVE ONE TABLET UNDER TONGUE AS NEEDED FOR CHEST PAIN EVERY 5 MINUTES FOR UP TO 3 DOSES 25 tablet 1   predniSONE (STERAPRED UNI-PAK 21 TAB) 10 MG (21) TBPK tablet Take by mouth daily. Take 6 tabs by mouth daily  for 2 days, then 5 tabs for 2 days, then 4 tabs for 2 days, then 3 tabs for 2 days, 2 tabs for 2 days, then 1 tab by mouth daily for 2  days 42 tablet 0   REPATHA SURECLICK 140 MG/ML SOAJ Inject 140 mg into the skin every 14 (fourteen) days.     tirzepatide (MOUNJARO) 10 MG/0.5ML Pen Inject 10 mg into the skin once a week.     valACYclovir (VALTREX) 1000 MG tablet Take 1 tablet (1,000 mg total) by mouth 3 (three) times daily. 21 tablet 0   No current facility-administered medications on file prior to visit.   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 07/25/2014   Followed in Pulmonary clinic/ [facility]/ [place]    Cardiac dysrhythmia    Chronic idiopathic constipation    Colitis, acute 02/08/2021   Coronary artery disease    Depression, major, recurrent, moderate (HCC)    Headache    Heart murmur    History of bladder infections    History of COVID-19    Hyperlipidemia    Hypertension    Hypothyroidism    Knee pain, bilateral    Melena 02/08/2021   Osteoarthritis    Pneumonia    Primary insomnia     Recovering alcoholic (HCC)    SVT (supraventricular tachycardia)    UTI (urinary tract infection)    Varicose veins    Vitamin B12 deficiency    Past Surgical History:  Procedure Laterality Date   ABDOMINAL HYSTERECTOMY  1991   APPENDECTOMY     BACK SURGERY     had 2 surgeries. Have rods placed in back    BREAST BIOPSY Left    x2   BREAST ENHANCEMENT SURGERY  2006   CARDIAC CATHETERIZATION  12/22/2020   stents placed   CHOLECYSTECTOMY  12/2019   removed lap band.    COLONOSCOPY  12/01/2016   Moderate predominantly sigmod diverticulosis. Otherwise grossly normal colonoscopy   EYE SURGERY     IOL- bilateral - [place]   KNEE ARTHROSCOPY Left    lap band surgery  1981   LUMBAR FUSION  07/29/2015   posterior level one   removal of cervical disc fragments  10/2007   pt. denies    TUBAL LIGATION  1970    Family History  Problem Relation Age of Onset   Heart disease Mother    Heart disease Father    Diabetes Sister    Other Sister        BRAIN TUMOR   Heart disease Brother    Heart disease Brother    Heart disease Brother    Heart disease Brother    Heart disease Brother    Colon cancer Maternal Aunt    Colon cancer Cousin    Social History   Socioeconomic History   Marital status: Married    Spouse name: Not on file   Number of children: 2   Years of education: Not on file   Highest education level: Not on file  Occupational History   Occupation: retired    Comment: Engineer, civil (consulting)  Tobacco Use   Smoking status: Never   Smokeless tobacco: Never  Vaping Use   Vaping status: Never Used  Substance and Sexual Activity   Alcohol use: No    Alcohol/week: 0.0 standard drinks of alcohol    Comment: recovery x 20 yrs   Drug use: No   Sexual activity: Not Currently    Comment: MARRIED  Other Topics Concern   Not on file  Social History Narrative   Not on  file   Social Determinants of Health   Financial Resource Strain: Low Risk  (12/12/2022)   Overall Financial  Resource Strain (CARDIA)    Difficulty of Paying Living Expenses: Not hard at all  Food Insecurity: No Food Insecurity (08/01/2022)   Hunger Vital Sign    Worried About Running Out of Food in the Last Year: Never true    Ran Out of Food in the Last Year: Never true  Transportation Needs: No Transportation Needs (08/01/2022)   PRAPARE - Administrator, Civil Service (Medical): No    Lack of Transportation (Non-Medical): No  Physical Activity: Inactive (12/12/2022)   Exercise Vital Sign    Days of Exercise per Week: 0 days    Minutes of Exercise per Session: 0 min  Stress: No Stress Concern Present (12/12/2022)   [employer] of Occupational Health - Occupational Stress Questionnaire    Feeling of Stress : Not at all  Social Connections: Moderately Isolated (12/12/2022)   Social Connection and Isolation Panel [NHANES]    Frequency of Communication with Friends and Family: Three times a week    Frequency of Social Gatherings with Friends and Family: Three times a week    Attends Religious Services: More than 4 times per year    Active Member of Clubs or Organizations: No    Attends Banker Meetings: Never    Marital Status: Widowed    Objective:  There were no vitals taken for this visit.     03/20/2023   11:21 AM 03/14/2023    7:26 PM 03/14/2023    5:19 PM  BP/Weight  Systolic BP 130 119 103  Diastolic BP 70 65 62  Wt. (Lbs) 159    BMI 26.46 kg/m2      Physical Exam  Diabetic Foot Exam - Simple   No data filed      Lab Results  Component Value Date   WBC 9.8 03/03/2023   HGB 10.6 (L) 03/03/2023   HCT 32.2 (L) 03/03/2023   PLT 224 03/03/2023   GLUCOSE 88 03/03/2023   CHOL 210 (H) 12/12/2022   TRIG 181 (H) 12/12/2022   HDL 56 12/12/2022   LDLCALC 122 (H) 12/12/2022   ALT 19 03/03/2023   AST 21 03/03/2023   NA 131 (L) 03/03/2023   K 4.5 03/03/2023   CL 89 (L) 03/03/2023   CREATININE 1.37 (H) 03/03/2023   BUN 48 (H) 03/03/2023   CO2 36  (H) 03/03/2023   TSH 2.110 03/02/2023   HGBA1C 5.6 12/12/2022      Assessment & Plan:    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  No changes to medicines. Metoprolol tartrate 50 mg twice daily, on lisinopril 5 mg daily. Continue to work on eating a healthy diet and exercise.  Labs drawn today.     Secondary hypothyroidism Assessment & Plan: Previously well controlled Continue Synthroid at current dose  Recheck TSH and adjust Synthroid as indicated     Vitamin D insufficiency Assessment & Plan: Check labs.   Prediabetes Assessment & Plan: Continue mounjaro 10 mg weekly.  Continue to eat healthy.   Mixed hyperlipidemia Assessment & Plan: Poorly controlled. Currently on  repatha Continue to work on eating a healthy diet and exercise.  Labs drawn today.     Mild recurrent major depression (HCC) Assessment & Plan: The current medical regimen is effective;  continue present plan and medications.  Duloxetine 60 mg daily.  No orders of the defined types were placed in this encounter.   No orders of the defined types were placed in this encounter.    Follow-up: No follow-ups on file.   I,Saliou Barnier I Leal-Borjas,acting as a scribe for Blane Ohara, MD.,have documented all relevant documentation on the behalf of Blane Ohara, MD,as directed by  Blane Ohara, MD while in the presence of Blane Ohara, MD.   An After Visit Summary was printed and given to the patient.  Blane Ohara, MD [name] [phone]

## 2023-03-31 ENCOUNTER — Encounter (INDEPENDENT_AMBULATORY_CARE_PROVIDER_SITE_OTHER): Payer: Medicare Other | Admitting: Family Medicine

## 2023-03-31 DIAGNOSIS — E782 Mixed hyperlipidemia: Secondary | ICD-10-CM | POA: Diagnosis not present

## 2023-03-31 DIAGNOSIS — F330 Major depressive disorder, recurrent, mild: Secondary | ICD-10-CM

## 2023-03-31 DIAGNOSIS — E559 Vitamin D deficiency, unspecified: Secondary | ICD-10-CM

## 2023-03-31 DIAGNOSIS — R7303 Prediabetes: Secondary | ICD-10-CM

## 2023-03-31 DIAGNOSIS — E038 Other specified hypothyroidism: Secondary | ICD-10-CM

## 2023-03-31 DIAGNOSIS — I119 Hypertensive heart disease without heart failure: Secondary | ICD-10-CM

## 2023-04-03 ENCOUNTER — Ambulatory Visit: Payer: Self-pay

## 2023-04-03 ENCOUNTER — Other Ambulatory Visit: Payer: Self-pay

## 2023-04-03 MED ORDER — MOUNJARO 10 MG/0.5ML ~~LOC~~ SOAJ: 10.0000 mg | SUBCUTANEOUS | 0 refills | Status: DC

## 2023-04-03 NOTE — Progress Notes (Signed)
No show

## 2023-04-03 NOTE — Telephone Encounter (Signed)
      Chief Complaint: Has continued to have pain and itching from shingles she has had for 9 weeks. Rash is drying up. Symptoms: Above. "My primary doctor said she doesn't know what else to do for me." Frequency: 9 weeks Pertinent Negatives: Patient denies  Disposition: [x] ED /[] Urgent Care (no appt availability in office) / [] Appointment(In office/virtual)/ []  [place] [facility]/ [] Home Care/ [] Refused Recommended Disposition /[] [place] [facility]/ []  Follow-up with PCP Additional Notes: Pt. Agrees with ED.  Reason for Disposition  Patient sounds very sick or weak to the triager  Answer Assessment - Initial Assessment Questions 1. APPEARANCE of RASH: "Describe the rash."      Rash is gone 2. LOCATION: "Where is the rash located?"      Scalp, near her eye - left eye 3. ONSET: "When did the rash start?"      9 weeks  4. ITCHING: "Does the rash itch?" If Yes, ask: "How bad is the itch?"  (Scale 1-10; or mild, moderate, severe)     Severe 5. PAIN: "Does the rash hurt?" If Yes, ask: "How bad is the pain?"  (Scale 0-10; or none, mild, moderate, severe)    - NONE (0): no pain    - MILD (1-3): doesn't interfere with normal activities     - MODERATE (4-7): interferes with normal activities or awakens from sleep     - SEVERE (8-10): excruciating pain, unable to do any normal activities     Severe 6. OTHER SYMPTOMS: "Do you have any other symptoms?" (e.g., fever)     No 7. PREGNANCY: "Is there any chance you are pregnant?" "When was your last menstrual period?"     No  Protocols used: Shingles (Zoster)-A-AH

## 2023-04-06 ENCOUNTER — Other Ambulatory Visit: Payer: Self-pay

## 2023-04-07 MED ORDER — VALACYCLOVIR HCL 1 G PO TABS: 1000.0000 mg | ORAL_TABLET | Freq: Every day | ORAL | 0 refills | Status: DC

## 2023-04-11 DIAGNOSIS — H532 Diplopia: Secondary | ICD-10-CM | POA: Diagnosis not present

## 2023-04-11 DIAGNOSIS — H18413 Arcus senilis, bilateral: Secondary | ICD-10-CM | POA: Diagnosis not present

## 2023-04-11 DIAGNOSIS — H04123 Dry eye syndrome of bilateral lacrimal glands: Secondary | ICD-10-CM | POA: Diagnosis not present

## 2023-04-11 DIAGNOSIS — H43393 Other vitreous opacities, bilateral: Secondary | ICD-10-CM | POA: Diagnosis not present

## 2023-04-11 DIAGNOSIS — D3131 Benign neoplasm of right choroid: Secondary | ICD-10-CM | POA: Diagnosis not present

## 2023-04-11 DIAGNOSIS — B0239 Other herpes zoster eye disease: Secondary | ICD-10-CM | POA: Diagnosis not present

## 2023-04-11 DIAGNOSIS — Z961 Presence of intraocular lens: Secondary | ICD-10-CM | POA: Diagnosis not present

## 2023-04-13 ENCOUNTER — Other Ambulatory Visit: Payer: Self-pay

## 2023-04-13 ENCOUNTER — Telehealth: Payer: Self-pay

## 2023-04-13 MED ORDER — FLUOXETINE HCL 20 MG PO CAPS: 20.0000 mg | ORAL_CAPSULE | Freq: Every day | ORAL | 1 refills | Status: DC

## 2023-04-13 NOTE — Telephone Encounter (Signed)
Patient let voicemail stating cymbalta has been caing her to itch and has discontinued the medication. She also is wanting you to restart her on prozac, states she had some extra prozac at home and has taken that the past 2 days without any issues.

## 2023-04-13 NOTE — Telephone Encounter (Signed)
Per patient she is taking prozac  20 mg. Patient informed that rx has been sent.

## 2023-04-17 ENCOUNTER — Telehealth: Payer: Self-pay

## 2023-04-17 NOTE — Telephone Encounter (Signed)
Transition Care Management Follow-up Telephone Call Date of discharge and from where: 03/14/2023 [facility] How have you been since you were released from the hospital? Patient stated she feels about the same, still has itching, pain and headaches. Any questions or concerns? No  Items Reviewed: Did the pt receive and understand the discharge instructions provided? Yes  Medications obtained and verified? Yes  Other? No  Any new allergies since your discharge? No  Dietary orders reviewed? Yes Do you have support at home? Yes   Follow up appointments reviewed:  PCP Hospital f/u appt confirmed? Yes  Scheduled to see Blane Ohara, MD on 03/20/2023 @ [name]. Specialist Hospital f/u appt confirmed? No  Scheduled to see  on  @ . Are transportation arrangements needed? No  If their condition worsens, is the pt aware to call PCP or go to the Emergency Dept.? Yes Was the patient provided with contact information for the PCP's office or ED? Yes Was to pt encouraged to call back with questions or concerns? Yes   Roberta Angell [facility]  [facility], [facility] Guide Direct Dial: [phone]  Website: [name]

## 2023-04-19 ENCOUNTER — Telehealth: Payer: Self-pay

## 2023-04-19 ENCOUNTER — Other Ambulatory Visit: Payer: Self-pay | Admitting: Family Medicine

## 2023-04-19 ENCOUNTER — Encounter: Payer: Self-pay | Admitting: Family Medicine

## 2023-04-19 DIAGNOSIS — B0229 Other postherpetic nervous system involvement: Secondary | ICD-10-CM

## 2023-04-19 NOTE — Telephone Encounter (Signed)
Patients daughter Cordelia Pen called requesting a referral to [facility] be placed so that patient may be seen and possibly get botox injection due to her shingles.

## 2023-04-20 ENCOUNTER — Encounter: Payer: Self-pay | Admitting: Family Medicine

## 2023-04-20 ENCOUNTER — Ambulatory Visit: Payer: Medicare Other | Admitting: Family Medicine

## 2023-04-20 VITALS — BP 112/62 | HR 60 | Temp 95.8°F | Resp 14 | Ht 65.0 in | Wt 160.0 lb

## 2023-04-20 DIAGNOSIS — R7303 Prediabetes: Secondary | ICD-10-CM | POA: Diagnosis not present

## 2023-04-20 DIAGNOSIS — B0229 Other postherpetic nervous system involvement: Secondary | ICD-10-CM | POA: Diagnosis not present

## 2023-04-20 DIAGNOSIS — R3915 Urgency of urination: Secondary | ICD-10-CM | POA: Diagnosis not present

## 2023-04-20 DIAGNOSIS — Z23 Encounter for immunization: Secondary | ICD-10-CM | POA: Diagnosis not present

## 2023-04-20 LAB — POCT URINALYSIS DIP (CLINITEK)
Bilirubin, UA: NEGATIVE
Blood, UA: NEGATIVE
Glucose, UA: NEGATIVE mg/dL
Ketones, POC UA: NEGATIVE mg/dL
Leukocytes, UA: NEGATIVE
Nitrite, UA: NEGATIVE
POC PROTEIN,UA: NEGATIVE
Spec Grav, UA: 1.015 (ref 1.010–1.025)
Urobilinogen, UA: 0.2 U/dL
pH, UA: 6 (ref 5.0–8.0)

## 2023-04-20 MED ORDER — TIRZEPATIDE 12.5 MG/0.5ML ~~LOC~~ SOAJ
12.5000 mg | SUBCUTANEOUS | 3 refills | Status: DC
Start: 2023-04-20 — End: 2023-08-14

## 2023-04-20 MED ORDER — AMITRIPTYLINE HCL 25 MG PO TABS: 25.0000 mg | ORAL_TABLET | Freq: Every day | ORAL | 2 refills | Status: DC

## 2023-04-20 MED ORDER — KETOROLAC TROMETHAMINE 60 MG/2ML IM SOLN
60.0000 mg | Freq: Once | INTRAMUSCULAR | Status: AC
Start: 2023-04-20 — End: 2023-04-20
  Administered 2023-04-20: 60 mg via INTRAMUSCULAR

## 2023-04-20 MED ORDER — AMITRIPTYLINE HCL 10 MG PO TABS
25.0000 mg | ORAL_TABLET | Freq: Every day | ORAL | Status: DC
Start: 2023-04-20 — End: 2023-04-20

## 2023-04-20 NOTE — Patient Instructions (Signed)
Managing Pain Without Opioids Opioids are strong medicines used to treat moderate to severe pain. For some people, especially those who have long-term (chronic) pain, opioids may not be the best choice for pain management due to: Side effects like nausea, constipation, and sleepiness. The risk of addiction (opioid use disorder). The longer you take opioids, the greater your risk of addiction. Pain that lasts for more than 3 months is called chronic pain. Managing chronic pain usually requires more than one approach and is often provided by a team of health care providers working together (multidisciplinary approach). Pain management may be done at a pain management center or pain clinic. How to manage pain without the use of opioids Use non-opioid medicines Non-opioid medicines for pain may include: Over-the-counter or prescription non-steroidal anti-inflammatory drugs (NSAIDs). These may be the first medicines used for pain. They work well for muscle and bone pain, and they reduce swelling. Acetaminophen. This over-the-counter medicine may work well for milder pain but not swelling. Antidepressants. These may be used to treat chronic pain. A certain type of antidepressant (tricyclics) is often used. These medicines are given in lower doses for pain than when used for depression. Anticonvulsants. These are usually used to treat seizures but may also reduce nerve (neuropathic) pain. Muscle relaxants. These relieve pain caused by sudden muscle tightening (spasms). You may also use a pain medicine that is applied to the skin as a patch, cream, or gel (topical analgesic), such as a numbing medicine. These may cause fewer side effects than medicines taken by mouth. Do certain therapies as directed Some therapies can help with pain management. They include: Physical therapy. You will do exercises to gain strength and flexibility. A physical therapist may teach you exercises to move and stretch parts of  your body that are weak, stiff, or painful. You can learn these exercises at physical therapy visits and practice them at home. Physical therapy may also involve: Massage. Heat wraps or applying heat or cold to affected areas. Electrical signals that interrupt pain signals (transcutaneous electrical nerve stimulation, TENS). Weak lasers that reduce pain and swelling (low-level laser therapy). Signals from your body that help you learn to regulate pain (biofeedback). Occupational therapy. This helps you to learn ways to function at home and work with less pain. Recreational therapy. This involves trying new activities or hobbies, such as a physical activity or drawing. Mental health therapy, including: Cognitive behavioral therapy (CBT). This helps you learn coping skills for dealing with pain. Acceptance and commitment therapy (ACT) to change the way you think and react to pain. Relaxation therapies, including muscle relaxation exercises and mindfulness-based stress reduction. Pain management counseling. This may be individual, family, or group counseling.  Receive medical treatments Medical treatments for pain management include: Nerve block injections. These may include a pain blocker and anti-inflammatory medicines. You may have injections: Near the spine to relieve chronic back or neck pain. Into joints to relieve back or joint pain. Into nerve areas that supply a painful area to relieve body pain. Into muscles (trigger point injections) to relieve some painful muscle conditions. A medical device placed near your spine to help block pain signals and relieve nerve pain or chronic back pain (spinal cord stimulation device). Acupuncture. Follow these instructions at home Medicines Take over-the-counter and prescription medicines only as told by your health care provider. If you are taking pain medicine, ask your health care providers about possible side effects to watch out for. Do not  drive or use heavy machinery  while taking prescription opioid pain medicine. Lifestyle  Do not use drugs or alcohol to reduce pain. If you drink alcohol, limit how much you have to: 0-1 drink a day for women who are not pregnant. 0-2 drinks a day for men. Know how much alcohol is in a drink. In the [place] one drink equals one 12 oz bottle of beer (355 mL), one 5 oz glass of wine (148 mL), or one 1 oz glass of hard liquor (44 mL). Do not use any products that contain nicotine or tobacco. These products include cigarettes, chewing tobacco, and vaping devices, such as e-cigarettes. If you need help quitting, ask your health care provider. Eat a healthy diet and maintain a healthy weight. Poor diet and excess weight may make pain worse. Eat foods that are high in fiber. These include fresh fruits and vegetables, whole grains, and beans. Limit foods that are high in fat and processed sugars, such as fried and sweet foods. Exercise regularly. Exercise lowers stress and may help relieve pain. Ask your health care provider what activities and exercises are safe for you. If your health care provider approves, join an exercise class that combines movement and stress reduction. Examples include yoga and tai chi. Get enough sleep. Lack of sleep may make pain worse. Lower stress as much as possible. Practice stress reduction techniques as told by your therapist. General instructions Work with all your pain management providers to find the treatments that work best for you. You are an important member of your pain management team. There are many things you can do to reduce pain on your own. Consider joining an online or in-person support group for people who have chronic pain. Keep all follow-up visits. This is important. Where to find more information You can find more information about managing pain without opioids from: American Academy of Pain Medicine: painmed.org Institute for Chronic Pain:  instituteforchronicpain.org American Chronic Pain Association: theacpa.org Contact a health care provider if: You have side effects from pain medicine. Your pain gets worse or does not get better with treatments or home therapy. You are struggling with anxiety or depression. Summary Many types of pain can be managed without opioids. Chronic pain may respond better to pain management without opioids. Pain is best managed when you and a team of health care providers work together. Pain management without opioids may include non-opioid medicines, medical treatments, physical therapy, mental health therapy, and lifestyle changes. Tell your health care providers if your pain gets worse or is not being managed well enough. This information is not intended to replace advice given to you by your health care provider. Make sure you discuss any questions you have with your health care provider. Document Revised: 09/16/2020 Document Reviewed: 09/16/2020 Elsevier Patient Education  2024 [employer].

## 2023-04-20 NOTE — Progress Notes (Signed)
Subjective:  Patient ID: Courtney [name], female    DOB: Apr 23, 1946  Age: 77 y.o. MRN: [id number]  Chief Complaint  Patient presents with   Urinary Tract Infection   post herpetic neuralgia    HPI   Courtney [name] comes in with complaints of back pain and dark urine. No burning or frequency of urination.  She continues to have burning discomfort of her forehead. Patient would like a referral to a neurologist for Botox injections and she would also like a Toradol injection. Patient states that she has gained a lot of weight since she got shingles due to inactivity and side effects of medication. She would like to increase her Mounjaro medication to 12.5 mg.      03/20/2023   11:25 AM 12/12/2022    3:29 PM 07/06/2022    9:45 AM 04/25/2022    8:57 AM 02/08/2022    9:18 AM  Depression screen PHQ 2/9  Decreased Interest 3 3 3  0 0  Down, Depressed, Hopeless 3 3 3  0 0  PHQ - 2 Score 6 6 6  0 0  Altered sleeping 3 3 3  0 2  Tired, decreased energy 3 3 3 3 3   Change in appetite 0 0 1  0  Feeling bad or failure about yourself  0 0 3 0 2  Trouble concentrating 1 2 3  0 0  Moving slowly or fidgety/restless 0 0 0 0 0  Suicidal thoughts 0 0 0 0 0  PHQ-9 Score 13 14 19 3 7   Difficult doing work/chores Somewhat difficult Not difficult at all Very difficult Not difficult at all         12/12/2022    3:29 PM  Fall Risk   Falls in the past year? 0  Number falls in past yr: 0  Injury with Fall? 0  Risk for fall due to : No Fall Risks  Follow up Falls evaluation completed    Patient Care Team: Blane Ohara, MD as PCP - General (Internal Medicine) Corky Crafts, MD as PCP - Cardiology (Cardiology) McGukin, Luvenia Redden., MD (Cardiology) Chilton Greathouse, MD as Consulting Physician (Pulmonary Disease) Zettie Pho, [facility] (Inactive) (Pharmacist)   Review of Systems  Constitutional:  Negative for chills, fatigue and fever.  HENT:  Negative for congestion, ear pain, rhinorrhea and sore throat.    Respiratory:  Negative for cough and shortness of breath.   Cardiovascular:  Negative for chest pain.  Gastrointestinal:  Negative for abdominal pain, constipation, diarrhea, nausea and vomiting.  Genitourinary:  Positive for urgency. Negative for dysuria.  Musculoskeletal:  Positive for back pain (lifted something heavy, lower back). Negative for myalgias.  Neurological:  Negative for dizziness, weakness, light-headedness and headaches.  Psychiatric/Behavioral:  Negative for dysphoric mood. The patient is not nervous/anxious.     Current Outpatient Medications on File Prior to Visit  Medication Sig Dispense Refill   ALPRAZolam (XANAX) 0.5 MG tablet Take 1 tablet (0.5 mg total) by mouth 2 (two) times daily as needed for anxiety. 60 tablet 0   amLODipine (NORVASC) 5 MG tablet Take 5 mg by mouth daily.     D-Mannose 350 MG CAPS Take 350 mg by mouth daily.     FLUoxetine (PROZAC) 20 MG capsule Take 1 capsule (20 mg total) by mouth daily. 90 capsule 1   furosemide (LASIX) 40 MG tablet Take 1 tablet (40 mg total) by mouth daily. 30 tablet 3   gabapentin (NEURONTIN) 100 MG capsule Take 1 capsule (100 mg  total) by mouth 3 (three) times daily. 90 capsule 3   levothyroxine (SYNTHROID) 75 MCG tablet TAKE 1 TABLET(75 MCG) BY MOUTH DAILY 90 tablet 1   metoprolol tartrate (LOPRESSOR) 50 MG tablet TAKE 1 TABLET(50 MG) BY MOUTH TWICE DAILY WITH MEALS 180 tablet 1   Multiple Vitamins-Minerals (MULTIVITAMIN WITH MINERALS) tablet Take 1 tablet by mouth daily.     nitroGLYCERIN (NITROSTAT) 0.4 MG SL tablet DISSOLVE ONE TABLET UNDER TONGUE AS NEEDED FOR CHEST PAIN EVERY 5 MINUTES FOR UP TO 3 DOSES 25 tablet 1   REPATHA SURECLICK 140 MG/ML SOAJ Inject 140 mg into the skin every 14 (fourteen) days.     tirzepatide (MOUNJARO) 10 MG/0.5ML Pen Inject 10 mg into the skin once a week. 6 mL 0   valACYclovir (VALTREX) 1000 MG tablet Take 1 tablet (1,000 mg total) by mouth daily. 90 tablet 0   No current  facility-administered medications on file prior to visit.   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 07/25/2014   Followed in Pulmonary clinic/ [facility]/ [facility]    Cardiac dysrhythmia    Chronic idiopathic constipation    Colitis, acute 02/08/2021   Coronary artery disease    Depression, major, recurrent, moderate (HCC)    Headache    Heart murmur    History of bladder infections    History of COVID-19    Hyperlipidemia    Hypertension    Hypothyroidism    Knee pain, bilateral    Melena 02/08/2021   Osteoarthritis    Pneumonia    Primary insomnia    Recovering alcoholic (HCC)    SVT (supraventricular tachycardia)    UTI (urinary tract infection)    Varicose veins    Vitamin B12 deficiency    Past Surgical History:  Procedure Laterality Date   ABDOMINAL HYSTERECTOMY  1991   APPENDECTOMY     BACK SURGERY     had 2 surgeries. Have rods placed in back    BREAST BIOPSY Left    x2   BREAST ENHANCEMENT SURGERY  2006   CARDIAC CATHETERIZATION  12/22/2020   stents placed   CHOLECYSTECTOMY  12/2019   removed lap band.    COLONOSCOPY  12/01/2016   Moderate predominantly sigmod diverticulosis. Otherwise grossly normal colonoscopy   EYE SURGERY     IOL- bilateral - [place]   KNEE ARTHROSCOPY Left    lap band surgery  1981   LUMBAR FUSION  07/29/2015   posterior level one   removal of cervical disc fragments  10/2007   pt. denies    TUBAL LIGATION  1970    Family History  Problem Relation Age of Onset   Heart disease Mother    Heart disease Father    Diabetes Sister    Other Sister        BRAIN TUMOR   Heart disease Brother    Heart disease Brother    Heart disease Brother    Heart disease Brother    Heart disease Brother    Colon cancer Maternal Aunt    Colon cancer Cousin    Social History   Socioeconomic History   Marital status: Married     Spouse name: Not on file   Number of children: 2   Years of education: Not on file   Highest education level: Not on file  Occupational History   Occupation:  retired    Comment: Engineer, civil (consulting)  Tobacco Use   Smoking status: Never   Smokeless tobacco: Never  Vaping Use   Vaping status: Never Used  Substance and Sexual Activity   Alcohol use: No    Alcohol/week: 0.0 standard drinks of alcohol    Comment: recovery x 20 yrs   Drug use: No   Sexual activity: Not Currently    Comment: MARRIED  Other Topics Concern   Not on file  Social History Narrative   Not on file   Social Determinants of Health   Financial Resource Strain: Low Risk  (12/12/2022)   Overall Financial Resource Strain (CARDIA)    Difficulty of Paying Living Expenses: Not hard at all  Food Insecurity: No Food Insecurity (08/01/2022)   Hunger Vital Sign    Worried About Running Out of Food in the Last Year: Never true    Ran Out of Food in the Last Year: Never true  Transportation Needs: No Transportation Needs (08/01/2022)   PRAPARE - Administrator, Civil Service (Medical): No    Lack of Transportation (Non-Medical): No  Physical Activity: Inactive (12/12/2022)   Exercise Vital Sign    Days of Exercise per Week: 0 days    Minutes of Exercise per Session: 0 min  Stress: No Stress Concern Present (12/12/2022)   [employer] of Occupational Health - Occupational Stress Questionnaire    Feeling of Stress : Not at all  Social Connections: Moderately Isolated (12/12/2022)   Social Connection and Isolation Panel [NHANES]    Frequency of Communication with Friends and Family: Three times a week    Frequency of Social Gatherings with Friends and Family: Three times a week    Attends Religious Services: More than 4 times per year    Active Member of Clubs or Organizations: No    Attends Banker Meetings: Never    Marital Status: Widowed    Objective:  BP 112/62   Pulse 60   Temp (!) 95.8 F  (35.4 C)   Resp 14   Ht 5\' 5"  (1.651 m)   Wt 160 lb (72.6 kg)   BMI 26.63 kg/m      04/20/2023    1:34 PM 03/20/2023   11:21 AM 03/14/2023    7:26 PM  BP/Weight  Systolic BP 112 130 119  Diastolic BP 62 70 65  Wt. (Lbs) 160 159   BMI 26.63 kg/m2 26.46 kg/m2     Physical Exam Constitutional:      General: She is not in acute distress.    Appearance: Normal appearance.  Cardiovascular:     Rate and Rhythm: Normal rate and regular rhythm.     Heart sounds: Normal heart sounds.  Pulmonary:     Effort: Pulmonary effort is normal.     Breath sounds: Normal breath sounds.  Abdominal:     General: Bowel sounds are normal.     Palpations: Abdomen is soft.     Tenderness: There is abdominal tenderness.  Skin:    General: Skin is warm.  Neurological:     Mental Status: She is alert. Mental status is at baseline.  Psychiatric:        Mood and Affect: Mood normal.     Lab Results  Component Value Date   WBC 9.8 03/03/2023   HGB 10.6 (L) 03/03/2023   HCT 32.2 (L) 03/03/2023   PLT 224 03/03/2023   GLUCOSE 88 03/03/2023   CHOL 210 (H) 12/12/2022  TRIG 181 (H) 12/12/2022   HDL 56 12/12/2022   LDLCALC 122 (H) 12/12/2022   ALT 19 03/03/2023   AST 21 03/03/2023   NA 131 (L) 03/03/2023   K 4.5 03/03/2023   CL 89 (L) 03/03/2023   CREATININE 1.37 (H) 03/03/2023   BUN 48 (H) 03/03/2023   CO2 36 (H) 03/03/2023   TSH 2.110 03/02/2023   HGBA1C 5.6 12/12/2022      Assessment & Plan:    Urinary urgency Assessment & Plan: Acute UA neg for UTI, Urine culture sent Drink plenty of fluids Remember to wipe from front to back and don't hold it in! If possible, empty your bladder every 4 hours.  Await labs/testing for assessment and recommendations.   Orders: -     Urine Culture -     POCT URINALYSIS DIP (CLINITEK)  Post herpetic neuralgia Assessment & Plan: Continue current management Referral to neurology for Botox Amitriptyline 25 mg by mouth once daily at  bedtime   Orders: -     Ketorolac Tromethamine  Prediabetes Assessment & Plan: Increase mounjaro to 12.5 mg SQ once weekly Continue to eat healthy and exercise as tolerated  Orders: -     Tirzepatide; Inject 12.5 mg into the skin once a week for 16 doses.  Dispense: 2 mL; Refill: 3  Encounter for immunization -     Flu Vaccine Trivalent High Dose (Fluad)  Other orders -     Amitriptyline HCl; Take 1 tablet (25 mg total) by mouth at bedtime.  Dispense: 30 tablet; Refill: 2     Meds ordered this encounter  Medications   DISCONTD: amitriptyline (ELAVIL) tablet 25 mg   tirzepatide (MOUNJARO) 12.5 MG/0.5ML Pen    Sig: Inject 12.5 mg into the skin once a week for 16 doses.    Dispense:  2 mL    Refill:  3   ketorolac (TORADOL) injection 60 mg   amitriptyline (ELAVIL) 25 MG tablet    Sig: Take 1 tablet (25 mg total) by mouth at bedtime.    Dispense:  30 tablet    Refill:  2    Orders Placed This Encounter  Procedures   Urine Culture   Flu Vaccine Trivalent High Dose (Fluad)   POCT URINALYSIS DIP (CLINITEK)     Follow-up: Return in about 3 months (around 07/21/2023), or if symptoms worsen or fail to improve, for chronic.   I,Carolyn M Morrison,acting as a scribe for Renne Crigler, FNP.,have documented all relevant documentation on the behalf of Renne Crigler, FNP,as directed by  Renne Crigler, FNP while in the presence of Renne Crigler, FNP.   An After Visit Summary was printed and given to the patient.  Total time spent on today's visit was greater than 30 minutes, including both face-to-face time and nonface-to-face time personally spent on review of chart (labs and imaging), discussing labs and goals, discussing further work-up, treatment options, referrals to specialist if needed, reviewing outside records if pertinent, answering patient's questions, and coordinating care.   Lajuana Matte, FNP [name] [phone]

## 2023-04-20 NOTE — Assessment & Plan Note (Signed)
Acute UA neg for UTI, Urine culture sent Drink plenty of fluids Remember to wipe from front to back and don't hold it in! If possible, empty your bladder every 4 hours.  Await labs/testing for assessment and recommendations.

## 2023-04-20 NOTE — Assessment & Plan Note (Signed)
Increase mounjaro to 12.5 mg SQ once weekly Continue to eat healthy and exercise as tolerated

## 2023-04-20 NOTE — Assessment & Plan Note (Signed)
Continue current management Referral to neurology for Botox Amitriptyline 25 mg by mouth once daily at bedtime

## 2023-04-24 ENCOUNTER — Ambulatory Visit (INDEPENDENT_AMBULATORY_CARE_PROVIDER_SITE_OTHER): Payer: Medicare Other | Admitting: Neurology

## 2023-04-24 ENCOUNTER — Encounter: Payer: Self-pay | Admitting: Neurology

## 2023-04-24 VITALS — BP 133/67 | HR 99 | Ht 65.0 in | Wt 162.0 lb

## 2023-04-24 DIAGNOSIS — B0229 Other postherpetic nervous system involvement: Secondary | ICD-10-CM | POA: Diagnosis not present

## 2023-04-24 MED ORDER — OXCARBAZEPINE 150 MG PO TABS: 150.0000 mg | ORAL_TABLET | Freq: Two times a day (BID) | ORAL | 11 refills | Status: DC

## 2023-04-24 MED ORDER — LIDOCAINE-PRILOCAINE 2.5-2.5 % EX CREA: TOPICAL_CREAM | CUTANEOUS | 6 refills | Status: DC

## 2023-04-24 MED ORDER — GABAPENTIN 300 MG PO CAPS
600.0000 mg | ORAL_CAPSULE | Freq: Three times a day (TID) | ORAL | 11 refills | Status: DC
Start: 2023-04-24 — End: 2023-10-18

## 2023-04-24 NOTE — Progress Notes (Signed)
Chief Complaint  Patient presents with   New Patient (Initial Visit)    Rm 15. Patient with Daughter, [name]. Patient had shingles on left side and now has electrical sensation down left side of head, feeling warm gets her flustered and makes it worse.       ASSESSMENT AND PLAN  Courtney [name] is a 77 y.o. female   Left V1 V2 postherpetic neuralgia  Add on Trileptal 150 mg twice a day  Higher dose of gabapentin, up to 600 mg 3 times a day, may take extra dose at nighttime for better sleep, stop amitriptyline  Emla gel may mixed together with diclofenac as needed  DIAGNOSTIC DATA (LABS, IMAGING, TESTING) - I reviewed patient records, labs, notes, testing and imaging myself where available.   MEDICAL HISTORY:  Courtney [name], is a 77 year old female, accompanied by her daughter seen in request by her primary care doctor [name], Fritzi Mandes, for evaluation of postherpetic neuralgia, initial evaluation was on April 24, 2023  History is obtained from the patient and review of electronic medical records. I personally reviewed pertinent available imaging films in PACS.   PMHx of  Depression, anxiety Hypthyrodism CAD DM Lumbar decompression surgery  She underwent very stressful few months, lost her husband 5 months ago, before that, she is the main caregiver of her husband, who suffered chronic illness  She denies a history of shingles, in September 2024, she noticed left gluteal region rash, few weeks later, she also noticed itching painful rash at the left forehead, upper and lower eyelid, was diagnosed with shingles, treated with Valtrex 1000 mg 3 times a day for 1 week followed by steroid tapering, was seen by ophthalmologist, noted 2 lesions in her left eye per patient, but there was no visual loss, her eyes has improved with eyedrop, was also getting Valtrex 1000 mg daily prolonged treatment  Since rash broke out, she complains of constant itching, with intermittent radiating  discomfort at left forehead region, take gabapentin 100 mg 3 times a day, no significant benefit, she had weight gain of 20 pounds since steroid treatment, she contributed to the side effect of gabapentin  Recently given Elavil 25 mg every night for difficulty sleeping, did not help her symptoms much   PHYSICAL EXAM:   Vitals:   04/24/23 0900  BP: 133/67  Pulse: 99  Weight: 162 lb (73.5 kg)  Height: 5\' 5"  (1.651 m)   Not recorded     Body mass index is 26.96 kg/m.  PHYSICAL EXAMNIATION:  Gen: NAD, conversant, well nourised, well groomed                     Cardiovascular: Regular rate rhythm, no peripheral edema, warm, nontender. Eyes: Conjunctivae clear without exudates or hemorrhage Neck: Supple, no carotid bruits. Pulmonary: Clear to auscultation bilaterally   NEUROLOGICAL EXAM:  MENTAL STATUS: Speech/cognition: Awake, alert, oriented to history taking and casual conversation CRANIAL NERVES: CN II: Visual fields are full to confrontation. Pupils are round equal and briskly reactive to light. CN III, IV, VI: extraocular movement are normal. No ptosis. CN V: Facial sensation is intact to light touch, bilateral corneal reflex was present CN VII: Face is symmetric with normal eye closure  CN VIII: Hearing is normal to causal conversation. CN IX, X: Phonation is normal. CN XI: Head turning and shoulder shrug are intact  MOTOR: There is no pronator drift of out-stretched arms. Muscle bulk and tone are normal. Muscle strength is normal.  REFLEXES: Reflexes are 2+ and symmetric at the biceps, triceps, knees, and ankles. Plantar responses are flexor.  SENSORY: Intact to light touch, pinprick and vibratory sensation are intact in fingers and toes.  COORDINATION: There is no trunk or limb dysmetria noted.  GAIT/STANCE: Posture is normal. Gait is steady      REVIEW OF SYSTEMS:  Full 14 system review of systems performed and notable only for as above All other review  of systems were negative.   ALLERGIES: Allergies  Allergen Reactions   Levofloxacin Hives, Shortness Of Breath, Swelling and Other (See Comments)    RASH IN MOUTH   (can take IV route)   Augmentin [Amoxicillin-Pot Clavulanate] Nausea Only   Bactrim [Sulfamethoxazole-Trimethoprim] Hives and Itching   Clarithromycin     GI upset Other reaction(s): GI Upset (intolerance) GI upset -can take but does cause mild upset    Cymbalta [Duloxetine Hcl]     itching   Hydroxyzine Swelling   Lyrica [Pregabalin]    Nexletol [Bempedoic Acid]     Muscle pain   Statins Other (See Comments)    Myalgias   Zetia [Ezetimibe]     Muscle pain    HOME MEDICATIONS: Current Outpatient Medications  Medication Sig Dispense Refill   ALPRAZolam (XANAX) 0.5 MG tablet Take 1 tablet (0.5 mg total) by mouth 2 (two) times daily as needed for anxiety. 60 tablet 0   amitriptyline (ELAVIL) 25 MG tablet Take 1 tablet (25 mg total) by mouth at bedtime. 30 tablet 2   amLODipine (NORVASC) 5 MG tablet Take 5 mg by mouth daily.     D-Mannose 350 MG CAPS Take 350 mg by mouth daily.     FLUoxetine (PROZAC) 20 MG capsule Take 1 capsule (20 mg total) by mouth daily. 90 capsule 1   furosemide (LASIX) 40 MG tablet Take 1 tablet (40 mg total) by mouth daily. 30 tablet 3   gabapentin (NEURONTIN) 100 MG capsule Take 1 capsule (100 mg total) by mouth 3 (three) times daily. 90 capsule 3   levothyroxine (SYNTHROID) 75 MCG tablet TAKE 1 TABLET(75 MCG) BY MOUTH DAILY 90 tablet 1   metoprolol tartrate (LOPRESSOR) 50 MG tablet TAKE 1 TABLET(50 MG) BY MOUTH TWICE DAILY WITH MEALS 180 tablet 1   Multiple Vitamins-Minerals (MULTIVITAMIN WITH MINERALS) tablet Take 1 tablet by mouth daily.     nitroGLYCERIN (NITROSTAT) 0.4 MG SL tablet DISSOLVE ONE TABLET UNDER TONGUE AS NEEDED FOR CHEST PAIN EVERY 5 MINUTES FOR UP TO 3 DOSES 25 tablet 1   REPATHA SURECLICK 140 MG/ML SOAJ Inject 140 mg into the skin every 14 (fourteen) days.     tirzepatide  (MOUNJARO) 10 MG/0.5ML Pen Inject 10 mg into the skin once a week. 6 mL 0   tirzepatide (MOUNJARO) 12.5 MG/0.5ML Pen Inject 12.5 mg into the skin once a week for 16 doses. 2 mL 3   valACYclovir (VALTREX) 1000 MG tablet Take 1 tablet (1,000 mg total) by mouth daily. 90 tablet 0   No current facility-administered medications for this visit.    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 07/25/2014   Followed in Pulmonary clinic/ [facility]/ [facility]    Cardiac dysrhythmia    Chronic idiopathic constipation    Colitis, acute 02/08/2021   Coronary artery disease    Depression, major, recurrent, moderate (HCC)    Headache  Heart murmur    History of bladder infections    History of COVID-19    Hyperlipidemia    Hypertension    Hypothyroidism    Knee pain, bilateral    Melena 02/08/2021   Osteoarthritis    Pneumonia    Primary insomnia    Recovering alcoholic (HCC)    SVT (supraventricular tachycardia) (HCC)    UTI (urinary tract infection)    Varicose veins    Vitamin B12 deficiency     PAST SURGICAL HISTORY: Past Surgical History:  Procedure Laterality Date   ABDOMINAL HYSTERECTOMY  1991   APPENDECTOMY     BACK SURGERY     had 2 surgeries. Have rods placed in back    BREAST BIOPSY Left    x2   BREAST ENHANCEMENT SURGERY  2006   CARDIAC CATHETERIZATION  12/22/2020   stents placed   CHOLECYSTECTOMY  12/2019   removed lap band.    COLONOSCOPY  12/01/2016   Moderate predominantly sigmod diverticulosis. Otherwise grossly normal colonoscopy   EYE SURGERY     IOL- bilateral - [place]   KNEE ARTHROSCOPY Left    lap band surgery  1981   LUMBAR FUSION  07/29/2015   posterior level one   removal of cervical disc fragments  10/2007   pt. denies    TUBAL LIGATION  1970    FAMILY HISTORY: Family History  Problem Relation Age of Onset    Heart disease Mother    Heart disease Father    Diabetes Sister    Other Sister        BRAIN TUMOR   Heart disease Brother    Heart disease Brother    Heart disease Brother    Heart disease Brother    Heart disease Brother    Colon cancer Maternal Aunt    Colon cancer Cousin     SOCIAL HISTORY: Social History   Socioeconomic History   Marital status: Married    Spouse name: Not on file   Number of children: 2   Years of education: Not on file   Highest education level: Not on file  Occupational History   Occupation: retired    Comment: Engineer, civil (consulting)  Tobacco Use   Smoking status: Never   Smokeless tobacco: Never  Vaping Use   Vaping status: Never Used  Substance and Sexual Activity   Alcohol use: No    Alcohol/week: 0.0 standard drinks of alcohol    Comment: recovery x 20 yrs   Drug use: No   Sexual activity: Not Currently    Comment: MARRIED  Other Topics Concern   Not on file  Social History Narrative   Not on file   Social Determinants of Health   Financial Resource Strain: Low Risk  (12/12/2022)   Overall Financial Resource Strain (CARDIA)    Difficulty of Paying Living Expenses: Not hard at all  Food Insecurity: No Food Insecurity (08/01/2022)   Hunger Vital Sign    Worried About Running Out of Food in the Last Year: Never true    Ran Out of Food in the Last Year: Never true  Transportation Needs: No Transportation Needs (08/01/2022)   PRAPARE - Administrator, Civil Service (Medical): No    Lack of Transportation (Non-Medical): No  Physical Activity: Inactive (12/12/2022)   Exercise Vital Sign    Days of Exercise per Week: 0 days    Minutes of Exercise per Session: 0 min  Stress: No Stress Concern Present (12/12/2022)  [employer] of Occupational Health - Occupational Stress Questionnaire    Feeling of Stress : Not at all  Social Connections: Moderately Isolated (12/12/2022)   Social Connection and Isolation Panel [NHANES]    Frequency of  Communication with Friends and Family: Three times a week    Frequency of Social Gatherings with Friends and Family: Three times a week    Attends Religious Services: More than 4 times per year    Active Member of Clubs or Organizations: No    Attends Banker Meetings: Never    Marital Status: Widowed  Intimate Partner Violence: Not At Risk (12/12/2022)   Humiliation, Afraid, Rape, and Kick questionnaire    Fear of Current or Ex-Partner: No    Emotionally Abused: No    Physically Abused: No    Sexually Abused: No      Levert Feinstein, M.D. Ph.D.  [facility] [address], [address] [place], [place] [address] Ph: [phone] Fax: [phone]  CC:  Blane Ohara, MD [address] [place],  [place] [address]  Blane Ohara, MD

## 2023-04-25 ENCOUNTER — Other Ambulatory Visit: Payer: Self-pay | Admitting: Family Medicine

## 2023-04-25 DIAGNOSIS — N3000 Acute cystitis without hematuria: Secondary | ICD-10-CM

## 2023-04-25 LAB — URINE CULTURE

## 2023-04-25 MED ORDER — NITROFURANTOIN MONOHYD MACRO 100 MG PO CAPS
100.0000 mg | ORAL_CAPSULE | Freq: Two times a day (BID) | ORAL | 0 refills | Status: AC
Start: 2023-04-25 — End: 2023-05-02

## 2023-04-27 DIAGNOSIS — Z1231 Encounter for screening mammogram for malignant neoplasm of breast: Secondary | ICD-10-CM | POA: Diagnosis not present

## 2023-04-27 LAB — HM MAMMOGRAPHY

## 2023-05-02 DIAGNOSIS — Z1231 Encounter for screening mammogram for malignant neoplasm of breast: Secondary | ICD-10-CM | POA: Diagnosis not present

## 2023-05-02 DIAGNOSIS — M533 Sacrococcygeal disorders, not elsewhere classified: Secondary | ICD-10-CM | POA: Diagnosis not present

## 2023-05-02 DIAGNOSIS — Z79899 Other long term (current) drug therapy: Secondary | ICD-10-CM | POA: Diagnosis not present

## 2023-05-02 DIAGNOSIS — G894 Chronic pain syndrome: Secondary | ICD-10-CM | POA: Diagnosis not present

## 2023-05-02 DIAGNOSIS — M961 Postlaminectomy syndrome, not elsewhere classified: Secondary | ICD-10-CM | POA: Diagnosis not present

## 2023-05-03 ENCOUNTER — Encounter: Payer: Self-pay | Admitting: Family Medicine

## 2023-05-06 DIAGNOSIS — R8281 Pyuria: Secondary | ICD-10-CM | POA: Diagnosis not present

## 2023-05-06 DIAGNOSIS — R300 Dysuria: Secondary | ICD-10-CM | POA: Diagnosis not present

## 2023-05-22 ENCOUNTER — Other Ambulatory Visit: Payer: Self-pay | Admitting: Family Medicine

## 2023-05-30 DIAGNOSIS — M48062 Spinal stenosis, lumbar region with neurogenic claudication: Secondary | ICD-10-CM | POA: Diagnosis not present

## 2023-06-01 ENCOUNTER — Other Ambulatory Visit: Payer: Self-pay | Admitting: Neurosurgery

## 2023-06-01 DIAGNOSIS — M48062 Spinal stenosis, lumbar region with neurogenic claudication: Secondary | ICD-10-CM

## 2023-06-07 ENCOUNTER — Other Ambulatory Visit: Payer: Self-pay | Admitting: Family Medicine

## 2023-06-08 ENCOUNTER — Ambulatory Visit: Payer: Self-pay | Admitting: Family Medicine

## 2023-06-08 NOTE — Telephone Encounter (Signed)
  Chief Complaint: Vomiting, body aches, low-grade fever, headache Symptoms: Vomiting Frequency: Since 4 am today Pertinent Negatives: Patient denies Diarrhea, abd pain,  Disposition: [x] ED /[] Urgent Care (no appt availability in office) / [] Appointment(In office/virtual)/ []  [facility]/ [] Home Care/ [] Refused Recommended Disposition /[] [facility]/ []  Follow-up with PCP Additional Notes: Pt reports she began vomiting around 4 am today, she reports that she has been trying to drink tomato juice and water and has been able to keep some down but she is still not able to keep fluids down every time, she reports she is urinating very little but still going. She reports body aches, temp 99.6, headache and vomiting. She reports she is very weak and cannot even lift her head off the bed. This RN recommended ED visit for hydration and eval/treat. Pt declines stating she is too weak to move or go anywhere. This RN educated on the concern for dehydration and importance of potential need for treatment. Pt continues to decline and states "I don't know why they can't just call me in Tamiflu". This RN educated pt again regarding dehydration and what ED could provide. Pt requests provider be made aware for further recs, encouraged pt to call with further concerns or worsening symptoms, educated on home care.   Reason for Disposition  Patient sounds very sick or weak to the triager  Answer Assessment - Initial Assessment Questions 1. VOMITING SEVERITY: "How many times have you vomited in the past 24 hours?"     - MILD:  1 - 2 times/day    - MODERATE: 3 - 5 times/day, decreased oral intake without significant weight loss or symptoms of dehydration    - SEVERE: 6 or more times/day, vomits everything or nearly everything, with significant weight loss, symptoms of dehydration      Moderate-4 times 2. ONSET: "When did the vomiting begin?"      About 4 am today 3. FLUIDS: "What fluids or food  have you vomited up today?" "Have you been able to keep any fluids down?"     Unable to hold down water 4. ABDOMEN PAIN: "Are your having any abdomen pain?" If Yes : "How bad is it and what does it feel like?" (e.g., crampy, dull, intermittent, constant)      No 5. DIARRHEA: "Is there any diarrhea?" If Yes, ask: "How many times today?"      No 6. CONTACTS: "Is there anyone else in the family with the same symptoms?"      NO 7. CAUSE: "What do you think is causing your vomiting?"     Unknown 8. HYDRATION STATUS: "Any signs of dehydration?" (e.g., dry mouth [not only dry lips], too weak to stand) "When did you last urinate?"     Very weak, urinating less 9. OTHER SYMPTOMS: "Do you have any other symptoms?" (e.g., fever, headache, vertigo, vomiting blood or coffee grounds, recent head injury)     Fever, headache, body aches  Protocols used: Vomiting-A-AH

## 2023-06-09 DIAGNOSIS — R1110 Vomiting, unspecified: Secondary | ICD-10-CM | POA: Diagnosis not present

## 2023-06-09 DIAGNOSIS — R300 Dysuria: Secondary | ICD-10-CM | POA: Diagnosis not present

## 2023-06-10 ENCOUNTER — Inpatient Hospital Stay (HOSPITAL_BASED_OUTPATIENT_CLINIC_OR_DEPARTMENT_OTHER)
Admission: EM | Admit: 2023-06-10 | Discharge: 2023-06-12 | DRG: 189 | Discharge status: Against Medical Advice | Payer: Medicare Other | Attending: Internal Medicine | Admitting: Internal Medicine

## 2023-06-10 ENCOUNTER — Encounter (HOSPITAL_BASED_OUTPATIENT_CLINIC_OR_DEPARTMENT_OTHER): Payer: Self-pay

## 2023-06-10 ENCOUNTER — Other Ambulatory Visit: Payer: Self-pay

## 2023-06-10 ENCOUNTER — Emergency Department (HOSPITAL_BASED_OUTPATIENT_CLINIC_OR_DEPARTMENT_OTHER): Payer: Medicare Other

## 2023-06-10 DIAGNOSIS — J189 Pneumonia, unspecified organism: Secondary | ICD-10-CM | POA: Diagnosis present

## 2023-06-10 DIAGNOSIS — J470 Bronchiectasis with acute lower respiratory infection: Principal | ICD-10-CM | POA: Diagnosis present

## 2023-06-10 DIAGNOSIS — J984 Other disorders of lung: Secondary | ICD-10-CM | POA: Diagnosis not present

## 2023-06-10 DIAGNOSIS — Z90710 Acquired absence of both cervix and uterus: Secondary | ICD-10-CM | POA: Diagnosis not present

## 2023-06-10 DIAGNOSIS — E785 Hyperlipidemia, unspecified: Secondary | ICD-10-CM | POA: Diagnosis present

## 2023-06-10 DIAGNOSIS — E876 Hypokalemia: Secondary | ICD-10-CM | POA: Diagnosis not present

## 2023-06-10 DIAGNOSIS — E860 Dehydration: Secondary | ICD-10-CM | POA: Diagnosis not present

## 2023-06-10 DIAGNOSIS — E038 Other specified hypothyroidism: Secondary | ICD-10-CM | POA: Diagnosis not present

## 2023-06-10 DIAGNOSIS — E871 Hypo-osmolality and hyponatremia: Secondary | ICD-10-CM | POA: Diagnosis present

## 2023-06-10 DIAGNOSIS — Z1152 Encounter for screening for COVID-19: Secondary | ICD-10-CM

## 2023-06-10 DIAGNOSIS — M545 Low back pain, unspecified: Secondary | ICD-10-CM | POA: Diagnosis present

## 2023-06-10 DIAGNOSIS — Z8616 Personal history of COVID-19: Secondary | ICD-10-CM

## 2023-06-10 DIAGNOSIS — N1831 Chronic kidney disease, stage 3a: Secondary | ICD-10-CM | POA: Diagnosis not present

## 2023-06-10 DIAGNOSIS — N390 Urinary tract infection, site not specified: Secondary | ICD-10-CM | POA: Diagnosis present

## 2023-06-10 DIAGNOSIS — F411 Generalized anxiety disorder: Secondary | ICD-10-CM | POA: Diagnosis present

## 2023-06-10 DIAGNOSIS — I129 Hypertensive chronic kidney disease with stage 1 through stage 4 chronic kidney disease, or unspecified chronic kidney disease: Secondary | ICD-10-CM | POA: Diagnosis not present

## 2023-06-10 DIAGNOSIS — Z87440 Personal history of urinary (tract) infections: Secondary | ICD-10-CM | POA: Diagnosis not present

## 2023-06-10 DIAGNOSIS — J479 Bronchiectasis, uncomplicated: Secondary | ICD-10-CM | POA: Diagnosis present

## 2023-06-10 DIAGNOSIS — R509 Fever, unspecified: Secondary | ICD-10-CM | POA: Diagnosis not present

## 2023-06-10 DIAGNOSIS — G8929 Other chronic pain: Secondary | ICD-10-CM | POA: Diagnosis not present

## 2023-06-10 DIAGNOSIS — Z955 Presence of coronary angioplasty implant and graft: Secondary | ICD-10-CM

## 2023-06-10 DIAGNOSIS — Z9049 Acquired absence of other specified parts of digestive tract: Secondary | ICD-10-CM | POA: Diagnosis not present

## 2023-06-10 DIAGNOSIS — B0229 Other postherpetic nervous system involvement: Secondary | ICD-10-CM | POA: Diagnosis not present

## 2023-06-10 DIAGNOSIS — R0602 Shortness of breath: Secondary | ICD-10-CM | POA: Diagnosis not present

## 2023-06-10 DIAGNOSIS — Z881 Allergy status to other antibiotic agents status: Secondary | ICD-10-CM

## 2023-06-10 DIAGNOSIS — Z888 Allergy status to other drugs, medicaments and biological substances status: Secondary | ICD-10-CM

## 2023-06-10 DIAGNOSIS — G894 Chronic pain syndrome: Secondary | ICD-10-CM | POA: Diagnosis present

## 2023-06-10 DIAGNOSIS — Z79899 Other long term (current) drug therapy: Secondary | ICD-10-CM | POA: Diagnosis not present

## 2023-06-10 DIAGNOSIS — I10 Essential (primary) hypertension: Secondary | ICD-10-CM | POA: Diagnosis present

## 2023-06-10 DIAGNOSIS — J8410 Pulmonary fibrosis, unspecified: Secondary | ICD-10-CM | POA: Diagnosis present

## 2023-06-10 DIAGNOSIS — E039 Hypothyroidism, unspecified: Secondary | ICD-10-CM | POA: Diagnosis not present

## 2023-06-10 DIAGNOSIS — J9601 Acute respiratory failure with hypoxia: Principal | ICD-10-CM

## 2023-06-10 DIAGNOSIS — J849 Interstitial pulmonary disease, unspecified: Secondary | ICD-10-CM | POA: Diagnosis not present

## 2023-06-10 DIAGNOSIS — Z79890 Hormone replacement therapy (postmenopausal): Secondary | ICD-10-CM

## 2023-06-10 DIAGNOSIS — Z5329 Procedure and treatment not carried out because of patient's decision for other reasons: Secondary | ICD-10-CM | POA: Diagnosis present

## 2023-06-10 DIAGNOSIS — Z886 Allergy status to analgesic agent status: Secondary | ICD-10-CM

## 2023-06-10 DIAGNOSIS — I2510 Atherosclerotic heart disease of native coronary artery without angina pectoris: Secondary | ICD-10-CM | POA: Diagnosis present

## 2023-06-10 DIAGNOSIS — Z882 Allergy status to sulfonamides status: Secondary | ICD-10-CM

## 2023-06-10 DIAGNOSIS — J90 Pleural effusion, not elsewhere classified: Secondary | ICD-10-CM | POA: Diagnosis not present

## 2023-06-10 LAB — PROTIME-INR
INR: 1 (ref 0.8–1.2)
Prothrombin Time: 13.3 s (ref 11.4–15.2)

## 2023-06-10 LAB — URINALYSIS, ROUTINE W REFLEX MICROSCOPIC
Bacteria, UA: NONE SEEN
Bilirubin Urine: NEGATIVE
Glucose, UA: NEGATIVE mg/dL
Hgb urine dipstick: NEGATIVE
Ketones, ur: 15 mg/dL — AB
Nitrite: NEGATIVE
Specific Gravity, Urine: 1.009 (ref 1.005–1.030)
pH: 7.5 (ref 5.0–8.0)

## 2023-06-10 LAB — CBC WITH DIFFERENTIAL/PLATELET
Abs Immature Granulocytes: 0.17 10*3/uL — ABNORMAL HIGH (ref 0.00–0.07)
Basophils Absolute: 0 10*3/uL (ref 0.0–0.1)
Basophils Relative: 0 %
Eosinophils Absolute: 0.1 10*3/uL (ref 0.0–0.5)
Eosinophils Relative: 0 %
HCT: 32.5 % — ABNORMAL LOW (ref 36.0–46.0)
Hemoglobin: 11.2 g/dL — ABNORMAL LOW (ref 12.0–15.0)
Immature Granulocytes: 1 %
Lymphocytes Relative: 6 %
Lymphs Abs: 0.8 10*3/uL (ref 0.7–4.0)
MCH: 32.4 pg (ref 26.0–34.0)
MCHC: 34.5 g/dL (ref 30.0–36.0)
MCV: 93.9 fL (ref 80.0–100.0)
Monocytes Absolute: 0.9 10*3/uL (ref 0.1–1.0)
Monocytes Relative: 7 %
Neutro Abs: 11 10*3/uL — ABNORMAL HIGH (ref 1.7–7.7)
Neutrophils Relative %: 86 %
Platelets: 165 10*3/uL (ref 150–400)
RBC: 3.46 MIL/uL — ABNORMAL LOW (ref 3.87–5.11)
RDW: 12.5 % (ref 11.5–15.5)
WBC: 13 10*3/uL — ABNORMAL HIGH (ref 4.0–10.5)
nRBC: 0 % (ref 0.0–0.2)

## 2023-06-10 LAB — COMPREHENSIVE METABOLIC PANEL
ALT: 76 U/L — ABNORMAL HIGH (ref 0–44)
AST: 32 U/L (ref 15–41)
Albumin: 3.3 g/dL — ABNORMAL LOW (ref 3.5–5.0)
Alkaline Phosphatase: 163 U/L — ABNORMAL HIGH (ref 38–126)
Anion gap: 9 (ref 5–15)
BUN: 20 mg/dL (ref 8–23)
CO2: 29 mmol/L (ref 22–32)
Calcium: 8.8 mg/dL — ABNORMAL LOW (ref 8.9–10.3)
Chloride: 90 mmol/L — ABNORMAL LOW (ref 98–111)
Creatinine, Ser: 1.17 mg/dL — ABNORMAL HIGH (ref 0.44–1.00)
GFR, Estimated: 48 mL/min — ABNORMAL LOW (ref >60)
Glucose, Bld: 117 mg/dL — ABNORMAL HIGH (ref 70–99)
Potassium: 3.3 mmol/L — ABNORMAL LOW (ref 3.5–5.1)
Sodium: 128 mmol/L — ABNORMAL LOW (ref 135–145)
Total Bilirubin: 0.4 mg/dL (ref <1.2)
Total Protein: 6.6 g/dL (ref 6.5–8.1)

## 2023-06-10 LAB — LACTIC ACID, PLASMA
Lactic Acid, Venous: 0.8 mmol/L (ref 0.5–1.9)
Lactic Acid, Venous: 0.9 mmol/L (ref 0.5–1.9)

## 2023-06-10 LAB — APTT: aPTT: 34 s (ref 24–36)

## 2023-06-10 LAB — RESP PANEL BY RT-PCR (RSV, FLU A&B, COVID)  RVPGX2
Influenza A by PCR: NEGATIVE
Influenza B by PCR: NEGATIVE
Resp Syncytial Virus by PCR: NEGATIVE
SARS Coronavirus 2 by RT PCR: NEGATIVE

## 2023-06-10 MED ORDER — SENNOSIDES-DOCUSATE SODIUM 8.6-50 MG PO TABS: 1.0000 | ORAL_TABLET | Freq: Every evening | ORAL | Status: DC | PRN

## 2023-06-10 MED ORDER — OXYCODONE-ACETAMINOPHEN 5-325 MG PO TABS
1.0000 | ORAL_TABLET | ORAL | Status: DC | PRN
  Administered 2023-06-11 (×2): 1 via ORAL
  Filled 2023-06-10 (×2): qty 1

## 2023-06-10 MED ORDER — ONDANSETRON HCL 4 MG/2ML IJ SOLN
4.0000 mg | Freq: Once | INTRAMUSCULAR | Status: AC
  Administered 2023-06-10: 4 mg via INTRAVENOUS
  Filled 2023-06-10: qty 2

## 2023-06-10 MED ORDER — LACTATED RINGERS IV SOLN: INTRAVENOUS | Status: AC

## 2023-06-10 MED ORDER — SODIUM CHLORIDE 0.9 % IV SOLN
500.0000 mg | INTRAVENOUS | Status: DC
  Filled 2023-06-10: qty 5

## 2023-06-10 MED ORDER — GUAIFENESIN ER 600 MG PO TB12
600.0000 mg | ORAL_TABLET | Freq: Two times a day (BID) | ORAL | Status: DC
  Administered 2023-06-11 (×3): 600 mg via ORAL
  Filled 2023-06-10 (×3): qty 1

## 2023-06-10 MED ORDER — SODIUM CHLORIDE 3 % IN NEBU
4.0000 mL | INHALATION_SOLUTION | Freq: Two times a day (BID) | RESPIRATORY_TRACT | Status: DC
  Filled 2023-06-10 (×2): qty 4

## 2023-06-10 MED ORDER — OXYCODONE-ACETAMINOPHEN 5-325 MG PO TABS
1.0000 | ORAL_TABLET | Freq: Once | ORAL | Status: AC
  Administered 2023-06-10: 1 via ORAL
  Filled 2023-06-10: qty 1

## 2023-06-10 MED ORDER — ACETAMINOPHEN 650 MG RE SUPP: 650.0000 mg | Freq: Four times a day (QID) | RECTAL | Status: DC | PRN

## 2023-06-10 MED ORDER — METOPROLOL TARTRATE 50 MG PO TABS
50.0000 mg | ORAL_TABLET | Freq: Two times a day (BID) | ORAL | Status: DC
  Administered 2023-06-11 (×2): 50 mg via ORAL
  Filled 2023-06-10 (×2): qty 1

## 2023-06-10 MED ORDER — ACETAMINOPHEN 325 MG PO TABS
650.0000 mg | ORAL_TABLET | Freq: Four times a day (QID) | ORAL | Status: DC | PRN
  Filled 2023-06-10: qty 2

## 2023-06-10 MED ORDER — LEVOTHYROXINE SODIUM 75 MCG PO TABS
75.0000 ug | ORAL_TABLET | Freq: Every day | ORAL | Status: DC
  Administered 2023-06-11: 75 ug via ORAL
  Filled 2023-06-10: qty 1

## 2023-06-10 MED ORDER — HYDROXYZINE HCL 25 MG PO TABS: 25.0000 mg | ORAL_TABLET | Freq: Three times a day (TID) | ORAL | Status: DC | PRN

## 2023-06-10 MED ORDER — ONDANSETRON HCL 4 MG/2ML IJ SOLN: 4.0000 mg | Freq: Four times a day (QID) | INTRAMUSCULAR | Status: DC | PRN

## 2023-06-10 MED ORDER — VALACYCLOVIR HCL 500 MG PO TABS
1000.0000 mg | ORAL_TABLET | Freq: Every day | ORAL | Status: DC
  Administered 2023-06-11: 1000 mg via ORAL
  Filled 2023-06-10: qty 2

## 2023-06-10 MED ORDER — OXYCODONE HCL 5 MG PO TABS
5.0000 mg | ORAL_TABLET | Freq: Once | ORAL | Status: AC
  Administered 2023-06-10: 5 mg via ORAL
  Filled 2023-06-10: qty 1

## 2023-06-10 MED ORDER — OXCARBAZEPINE 150 MG PO TABS
150.0000 mg | ORAL_TABLET | Freq: Two times a day (BID) | ORAL | Status: DC
  Administered 2023-06-11: 150 mg via ORAL
  Filled 2023-06-10 (×3): qty 1

## 2023-06-10 MED ORDER — IOHEXOL 350 MG/ML SOLN
75.0000 mL | Freq: Once | INTRAVENOUS | Status: AC | PRN
  Administered 2023-06-10: 75 mL via INTRAVENOUS

## 2023-06-10 MED ORDER — SODIUM CHLORIDE 0.9 % IV SOLN
1.0000 g | Freq: Once | INTRAVENOUS | Status: AC
  Administered 2023-06-10: 1 g via INTRAVENOUS
  Filled 2023-06-10: qty 10

## 2023-06-10 MED ORDER — SODIUM CHLORIDE 0.9% FLUSH
3.0000 mL | Freq: Two times a day (BID) | INTRAVENOUS | Status: DC
  Administered 2023-06-11 (×2): 3 mL via INTRAVENOUS

## 2023-06-10 MED ORDER — ALPRAZOLAM 0.25 MG PO TABS
0.2500 mg | ORAL_TABLET | Freq: Two times a day (BID) | ORAL | Status: DC | PRN
  Administered 2023-06-11 (×2): 0.25 mg via ORAL
  Filled 2023-06-10 (×2): qty 1

## 2023-06-10 MED ORDER — OXYCODONE-ACETAMINOPHEN 10-325 MG PO TABS: 1.0000 | ORAL_TABLET | ORAL | Status: DC | PRN

## 2023-06-10 MED ORDER — LACTATED RINGERS IV BOLUS (SEPSIS)
500.0000 mL | Freq: Once | INTRAVENOUS | Status: AC
  Administered 2023-06-10: 500 mL via INTRAVENOUS

## 2023-06-10 MED ORDER — AMLODIPINE BESYLATE 5 MG PO TABS
5.0000 mg | ORAL_TABLET | Freq: Every day | ORAL | Status: DC
  Administered 2023-06-11: 5 mg via ORAL
  Filled 2023-06-10: qty 1

## 2023-06-10 MED ORDER — ONDANSETRON HCL 4 MG PO TABS: 4.0000 mg | ORAL_TABLET | Freq: Four times a day (QID) | ORAL | Status: DC | PRN

## 2023-06-10 MED ORDER — SODIUM CHLORIDE 0.9 % IV SOLN
500.0000 mg | Freq: Once | INTRAVENOUS | Status: AC
  Administered 2023-06-10: 500 mg via INTRAVENOUS
  Filled 2023-06-10: qty 5

## 2023-06-10 MED ORDER — SODIUM CHLORIDE 0.9 % IV SOLN
1.0000 g | INTRAVENOUS | Status: DC
  Administered 2023-06-11: 1 g via INTRAVENOUS
  Filled 2023-06-10: qty 10

## 2023-06-10 MED ORDER — ENOXAPARIN SODIUM 40 MG/0.4ML IJ SOSY
40.0000 mg | PREFILLED_SYRINGE | INTRAMUSCULAR | Status: DC
  Administered 2023-06-11: 40 mg via SUBCUTANEOUS
  Filled 2023-06-10: qty 0.4

## 2023-06-10 MED ORDER — POTASSIUM CHLORIDE 20 MEQ PO PACK
40.0000 meq | PACK | Freq: Once | ORAL | Status: AC
  Administered 2023-06-11: 40 meq via ORAL
  Filled 2023-06-10: qty 2

## 2023-06-10 MED ORDER — OXYCODONE HCL 5 MG PO TABS
5.0000 mg | ORAL_TABLET | ORAL | Status: DC | PRN
  Administered 2023-06-11: 5 mg via ORAL
  Filled 2023-06-10: qty 1

## 2023-06-10 MED ORDER — METHYLPREDNISOLONE SODIUM SUCC 125 MG IJ SOLR
125.0000 mg | Freq: Once | INTRAMUSCULAR | Status: AC
  Administered 2023-06-10: 125 mg via INTRAVENOUS
  Filled 2023-06-10: qty 2

## 2023-06-10 MED ORDER — GABAPENTIN 300 MG PO CAPS
600.0000 mg | ORAL_CAPSULE | Freq: Three times a day (TID) | ORAL | Status: DC
  Filled 2023-06-10 (×2): qty 2

## 2023-06-10 MED ORDER — FLUOXETINE HCL 20 MG PO CAPS
20.0000 mg | ORAL_CAPSULE | Freq: Every day | ORAL | Status: DC
  Filled 2023-06-10: qty 1

## 2023-06-10 NOTE — ED Provider Notes (Signed)
[place] EMERGENCY DEPARTMENT AT [facility] Provider Note   CSN: [id number] Arrival date & time: 06/10/23  1129     History  Chief Complaint  Patient presents with   Generalized Body Aches         SARANDA KIZEWSKI is a 77 y.o. female.  Patient is a 77 year old female with a history of hypertension, SVT, hypothyroidism, bronchiectasis, CAD who is presenting today with several complaints.  Patient reports that 3 days ago she woke up with nausea and dry heaving.  It improved when she took a Zofran but had a poor appetite throughout the day and report yesterday was the same.  She has been sleeping a lot has not really been eating much and always has a cough but has noticed a thick green mucus in the last few days.  She has not had significant abdominal pain or dysuria but reports going to urgent care yesterday and they diagnosed her with a UTI and started her on Cipro.  She has taken 2 doses of the Cipro but reports she is not feeling any better.  She has also had low-grade temperatures to 99.5 at home and has been generally weak and had no energy.  She denies any change in medications except for the antibiotic she was prescribed yesterday.  The history is provided by the patient, a relative and medical records.       Home Medications Prior to Admission medications   Medication Sig Start Date End Date Taking? Authorizing Provider  ALPRAZolam (XANAX) 0.5 MG tablet TAKE 1 TABLET(0.5 MG) BY MOUTH TWICE DAILY AS NEEDED FOR ANXIETY 06/09/23   Sirivol, Kristie Cowman, MD  amitriptyline (ELAVIL) 25 MG tablet Take 1 tablet (25 mg total) by mouth at bedtime. 04/20/23   Renne Crigler, FNP  amLODipine (NORVASC) 5 MG tablet Take 5 mg by mouth daily.    [provider]  D-Mannose 350 MG CAPS Take 350 mg by mouth daily.    [provider]  FLUoxetine (PROZAC) 20 MG capsule Take 1 capsule (20 mg total) by mouth daily. 04/13/23   [name], Fritzi Mandes, MD  furosemide (LASIX) 40 MG tablet  Take 1 tablet (40 mg total) by mouth daily. 03/02/23   [name], Fritzi Mandes, MD  gabapentin (NEURONTIN) 300 MG capsule Take 2 capsules (600 mg total) by mouth 3 (three) times daily. 04/24/23   Levert Feinstein, MD  levothyroxine (SYNTHROID) 75 MCG tablet TAKE 1 TABLET(75 MCG) BY MOUTH DAILY 05/22/23   [name], Fritzi Mandes, MD  lidocaine-prilocaine (EMLA) cream 1 gram qid prn 04/24/23   Levert Feinstein, MD  metoprolol tartrate (LOPRESSOR) 50 MG tablet TAKE 1 TABLET(50 MG) BY MOUTH TWICE DAILY WITH MEALS 05/22/23   [name], Kirsten, MD  Multiple Vitamins-Minerals (MULTIVITAMIN WITH MINERALS) tablet Take 1 tablet by mouth daily.    [provider]  nitroGLYCERIN (NITROSTAT) 0.4 MG SL tablet DISSOLVE ONE TABLET UNDER TONGUE AS NEEDED FOR CHEST PAIN EVERY 5 MINUTES FOR UP TO 3 DOSES 02/24/23   Windell Moment, MD  OXcarbazepine (TRILEPTAL) 150 MG tablet Take 1 tablet (150 mg total) by mouth 2 (two) times daily. 04/24/23   Levert Feinstein, MD  REPATHA SURECLICK 140 MG/ML SOAJ Inject 140 mg into the skin every 14 (fourteen) days.    [provider]  tirzepatide [facility]) 10 MG/0.5ML Pen Inject 10 mg into the skin once a week. 04/03/23   [name], Fritzi Mandes, MD  tirzepatide [facility]) 12.5 MG/0.5ML Pen Inject 12.5 mg into the skin once a week for 16 doses. 04/20/23  08/04/23  Renne Crigler, FNP  valACYclovir (VALTREX) 1000 MG tablet Take 1 tablet (1,000 mg total) by mouth daily. 04/07/23   CoxFritzi Mandes, MD      Allergies    Levofloxacin, Augmentin [amoxicillin-pot clavulanate], Bactrim [sulfamethoxazole-trimethoprim], Clarithromycin, Cymbalta [duloxetine hcl], Hydroxyzine, Lyrica [pregabalin], Nexletol [bempedoic acid], Statins, and Zetia [ezetimibe]    Review of Systems   Review of Systems  Physical Exam Updated Vital Signs BP 126/66   Pulse 84   Temp 98.5 F (36.9 C)   Resp (!) 24   Ht 5\' 5"  (1.651 m)   Wt 72.6 kg   SpO2 98%   BMI 26.63 kg/m  Physical Exam Vitals and nursing note reviewed.  Constitutional:       General: She is in acute distress.     Appearance: She is well-developed.  HENT:     Head: Normocephalic and atraumatic.  Eyes:     Pupils: Pupils are equal, round, and reactive to light.  Cardiovascular:     Rate and Rhythm: Regular rhythm. Tachycardia present.     Heart sounds: Murmur heard.     No friction rub.  Pulmonary:     Effort: Pulmonary effort is normal.     Breath sounds: Rhonchi present. No wheezing or rales.     Comments: Coarse breath sounds heard more audibly in the upper lobes but mildly in the lower lobes as well Abdominal:     General: Bowel sounds are normal. There is no distension.     Palpations: Abdomen is soft.     Tenderness: There is no abdominal tenderness. There is no right CVA tenderness, left CVA tenderness, guarding or rebound.  Musculoskeletal:        General: No tenderness. Normal range of motion.     Right lower leg: No edema.     Left lower leg: No edema.     Comments: No edema  Skin:    General: Skin is warm and dry.     Findings: No rash.     Comments: Warm to the touch  Neurological:     Mental Status: She is alert and oriented to person, place, and time.     Cranial Nerves: No cranial nerve deficit.  Psychiatric:        Behavior: Behavior normal.     ED Results / Procedures / Treatments   Labs (all labs ordered are listed, but only abnormal results are displayed) Labs Reviewed  URINALYSIS, ROUTINE W REFLEX MICROSCOPIC - Abnormal; Notable for the following components:      Result Value   Ketones, ur 15 (*)    Protein, ur TRACE (*)    Leukocytes,Ua TRACE (*)    All other components within normal limits  COMPREHENSIVE METABOLIC PANEL - Abnormal; Notable for the following components:   Sodium 128 (*)    Potassium 3.3 (*)    Chloride 90 (*)    Glucose, Bld 117 (*)    Creatinine, Ser 1.17 (*)    Calcium 8.8 (*)    Albumin 3.3 (*)    ALT 76 (*)    Alkaline Phosphatase 163 (*)    GFR, Estimated 48 (*)    All other components  within normal limits  CBC WITH DIFFERENTIAL/PLATELET - Abnormal; Notable for the following components:   WBC 13.0 (*)    RBC 3.46 (*)    Hemoglobin 11.2 (*)    HCT 32.5 (*)    Neutro Abs 11.0 (*)    Abs Immature Granulocytes 0.17 (*)  All other components within normal limits  RESP PANEL BY RT-PCR (RSV, FLU A&B, COVID)  RVPGX2  CULTURE, BLOOD (ROUTINE X 2)  CULTURE, BLOOD (ROUTINE X 2)  URINE CULTURE  LACTIC ACID, PLASMA  PROTIME-INR  APTT  LACTIC ACID, PLASMA    EKG EKG Interpretation Date/Time:  Saturday June 10 2023 13:19:41 EST Ventricular Rate:  94 PR Interval:  161 QRS Duration:  88 QT Interval:  348 QTC Calculation: 436 R Axis:   37  Text Interpretation: Sinus rhythm Atrial premature complexes Low voltage, precordial leads No significant change since last tracing Confirmed by Gwyneth Sprout ([id number]) on 06/10/2023 1:52:58 PM  Radiology DG Chest Port 1 View Result Date: 06/10/2023 CLINICAL DATA:  Fever. EXAM: PORTABLE CHEST 1 VIEW COMPARISON:  03/03/2023. FINDINGS: The heart size and mediastinal contours are within normal limits. Pneumothorax or pleural effusion. Left base linear subsegmental atelectasis or scarring. The visualized skeletal structures are unremarkable. IMPRESSION: No active disease. Electronically Signed   By: Layla Maw M.D.   On: 06/10/2023 13:11    Procedures Procedures    Medications Ordered in ED Medications  lactated ringers infusion (has no administration in time range)  lactated ringers bolus 500 mL (0 mLs Intravenous Stopped 06/10/23 1417)    ED Course/ Medical Decision Making/ A&P                                 Medical Decision Making Amount and/or Complexity of Data Reviewed Independent Historian:     Details: daughters External Data Reviewed: notes. Labs: ordered. Decision-making details documented in ED Course. Radiology: ordered and independent interpretation performed. Decision-making details documented in ED  Course. ECG/medicine tests: ordered and independent interpretation performed. Decision-making details documented in ED Course.  Risk Prescription drug management.   Pt with multiple medical problems and comorbidities and presenting today with a complaint that caries a high risk for morbidity and mortality.  Here today with complaints concerning for possible sepsis concern as she has been off.  Patient also 84 she was placed on 2 L.  Concern for pneumonia.  Patient is having no abdominal pain or urinary symptoms and low suspicion for UTI today.  All concern for electrolyte abnormality or AKI.  Patient given a fluid bolus.  I independently interpreted patient's labs and EKG.  EKG without acute findings, UA with 11-20 white cells but no bacteria seen in not consistent with a UTI.  Culture was done, viral panel is negative, CBC with a leukocytosis of 13 today with a normal hemoglobin.  CMP with new hyponatremia with a sodium of 128, normal creatinine but mild hypokalemia as well at 3.3.  Lactic acid is normal. I have independently visualized and interpreted pt's images today.  Chest x-ray with chronic changes but no obvious signs of pneumonia.   2:34 PM CTA of the chest to further evaluate to ensure no evidence of PE that would be causing hypoxia versus pneumonia that is not present on chest x-ray.         Final Clinical Impression(s) / ED Diagnoses Final diagnoses:  None    Rx / DC Orders ED Discharge Orders     None         Gwyneth Sprout, MD 06/10/23 1526

## 2023-06-10 NOTE — ED Notes (Signed)
Patient up to bedside toilet.

## 2023-06-10 NOTE — Progress Notes (Signed)
Pt received to 2west room 34 from [facility] ED via EMS.  Pt oriented to room and call bell.  See adm database, assessment and vs's.   Afib.

## 2023-06-10 NOTE — Hospital Course (Signed)
Courtney [name] is a 77 y.o. female with medical history significant for CAD s/p DES to LAD (12/2020), CKD stage IIIa, HTN, HLD, bronchiectasis, pulmonary fibrosis, hypothyroidism, postherpetic neuralgia, chronic pain syndrome, anxiety who is admitted with acute hypoxia in setting of bronchiectasis with superimposed lower respiratory tract infection.

## 2023-06-10 NOTE — ED Notes (Signed)
Patient up to the bedside commode with minimal assistance.

## 2023-06-10 NOTE — Plan of Care (Signed)
[facility] -> Courtney [name]  [name] (MRN [id number])  Presents with shortness of breath weakness, 3 days of subjective fever.  Seen by urgent care yesterday for UTI symptoms and given Cipro but did not improve and now has worsening shortness of breath.  Complains of shortness of breath.  Has chronic bronchiectasis and was given 1 L in the ED.  CTA of the chest done to evaluate and no PE but did show new groundglass opacities with concern for community-acquired pneumonia.  Placed on CAP coverage with azithromycin and ceftriaxone.  Has a slight white count and was hypoxemic and had to be placed on 2 L.  Accepted to the telemetry unit at [facility] as an inpatient.  Also noted to have hyponatremia, hypokalemia.  Not septic appearing and could also have a bronchiectasis flare.

## 2023-06-10 NOTE — ED Triage Notes (Signed)
Patient presents to the ED with c/o generalized weakness, subjective fever for 4 days. Recently treated to UTI. Denies chest pain, shortness of breath, numbness, tingling

## 2023-06-10 NOTE — ED Provider Notes (Signed)
  Physical Exam  BP 126/66   Pulse 84   Temp 98.2 F (36.8 C) (Oral)   Resp (!) 24   Ht 5\' 5"  (1.651 m)   Wt 72.6 kg   SpO2 98%   BMI 26.63 kg/m   Physical Exam Vitals and nursing note reviewed.  HENT:     Head: Normocephalic and atraumatic.  Eyes:     Pupils: Pupils are equal, round, and reactive to light.  Cardiovascular:     Rate and Rhythm: Normal rate and regular rhythm.  Pulmonary:     Effort: Pulmonary effort is normal.     Breath sounds: Normal breath sounds.  Abdominal:     Palpations: Abdomen is soft.     Tenderness: There is no abdominal tenderness.  Skin:    General: Skin is warm and dry.  Neurological:     Mental Status: She is alert.  Psychiatric:        Mood and Affect: Mood normal.     Procedures  Procedures  ED Course / MDM   Clinical Course as of 06/10/23 1656  Sat Jun 10, 2023  1655 CTA of the chest shows no evidence of pulmonary embolism.  Findings consistent with chronic pulmonary fibrosis.  Overlying groundglass opacities would be concerning for acute pneumonia.  Small bilateral pleural effusions.  These findings with clinical picture of productive cough with green sputum, generalized weakness hypoxemia, leukocytosis and new O2 requirement most consistent with community-acquired pneumonia.  Covered with Rocephin and azithromycin.  Patient will be admitted to hospitalist service for inpatient management of pneumonia [MP]    Clinical Course User Index [MP] Royanne Foots, DO   Medical Decision Making I, Estelle June DO, have assumed care of this patient from the previous provider pending CTA chest, reevaluation and likely admission  Amount and/or Complexity of Data Reviewed Labs: ordered. Radiology: ordered. ECG/medicine tests: ordered.  Risk Prescription drug management.   Final diagnosis Community-acquired pneumonia Bronchiectasis Acute hypoxemic respiratory failure      Royanne Foots, DO 06/10/23 1657

## 2023-06-10 NOTE — H&P (Signed)
History and Physical    Courtney [name] GEX:[id number] DOB: 13-Jan-1946 DOA: 06/10/2023  PCP: Blane Ohara, MD  Patient coming from: Home  I have personally briefly reviewed patient's old medical records in [facility]  Chief Complaint: Shortness of breath, weakness  HPI: Courtney [name] is a 77 y.o. female with medical history significant for CAD s/p DES to LAD (12/2020), CKD stage IIIa, HTN, HLD, bronchiectasis, pulmonary fibrosis, hypothyroidism, postherpetic neuralgia, chronic pain syndrome, anxiety who presented to the ED for evaluation of shortness of breath and weakness.  Patient states that over the last 3 days she has been getting progressively weaker.  She has been feeling short of breath and has had subjective fevers at home.  She reports chronic cough but now has new sputum production including thick green mucus these last few days.  She has had nausea and dry heaving but no emesis.  She has been feeling fatigued and not walking or eating much.  She is feeling dehydrated.  She says she went to urgent care yesterday and was diagnosed with a UTI and started on antibiotics with ciprofloxacin.  She says she has taken 2 doses of the Cipro without any improvement in her symptoms.  She states that she has not had any dysuria or difficulty with urination.  She denies any chest pain, abdominal pain, diarrhea, peripheral edema.  She notes that she recently had a prolonged shingles attack which involved the left part of her face/scalp and involved her left eye and is only now starting to have some improvement after over 11 months.  [facility] ED Course  Labs/Imaging on admission: I have personally reviewed following labs and imaging studies.  Initial vitals showed BP 119/64, pulse 103, RR 16, temp 98.5 F, SpO2 94% on room air.  Per ED documentation SpO2 dropped to 84% and patient was placed on 2 L O2 via [place] with improvement.  Labs showed WBC 13.0, hemoglobin 11.2, platelets  165,000, sodium 128, potassium 3.3, bicarb 29, BUN 20, creatinine 1.17, serum glucose 117, AST 32, ALT 76, alk phos 163, total bilirubin 0.4, lactic acid 0.8 > 0.9.  SARS-CoV-2, influenza, RSV PCR negative.  Blood and urine cultures in process.  Patient was given 125 mg IV Solu-Medrol, IV ceftriaxone and azithromycin, 500 cc LR.  The hospitalist service was consulted to admit for further evaluation and management.  Review of Systems: All systems reviewed and are negative except as documented in history of present illness above.   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 07/25/2014   Followed in Pulmonary clinic/  Healthcare/ [place]    Cardiac dysrhythmia    Chronic idiopathic constipation    Colitis, acute 02/08/2021   Coronary artery disease    Depression, major, recurrent, moderate (HCC)    Headache    Heart murmur    History of bladder infections    History of COVID-19    Hyperlipidemia    Hypertension    Hypothyroidism    Knee pain, bilateral    Melena 02/08/2021   Osteoarthritis    Pneumonia    Primary insomnia    Recovering alcoholic (HCC)    SVT (supraventricular tachycardia) (HCC)    UTI (urinary tract infection)    Varicose veins    Vitamin B12 deficiency     Past Surgical History:  Procedure Laterality Date   ABDOMINAL HYSTERECTOMY  1991   APPENDECTOMY     BACK SURGERY     had 2 surgeries. Have rods placed in back    BREAST BIOPSY Left    x2   BREAST ENHANCEMENT SURGERY  2006   CARDIAC CATHETERIZATION  12/22/2020   stents placed   CHOLECYSTECTOMY  12/2019   removed lap band.    COLONOSCOPY  12/01/2016   Moderate predominantly sigmod diverticulosis. Otherwise grossly normal colonoscopy   EYE SURGERY     IOL- bilateral - [place]   KNEE ARTHROSCOPY Left    lap band surgery  1981   LUMBAR FUSION  07/29/2015   posterior level one   removal of  cervical disc fragments  10/2007   pt. denies    TUBAL LIGATION  1970    Social History:  reports that she has never smoked. She has never used smokeless tobacco. She reports that she does not drink alcohol and does not use drugs.  Allergies  Allergen Reactions   Levofloxacin Hives, Shortness Of Breath, Swelling and Other (See Comments)    RASH IN MOUTH   (can take IV route)   Augmentin [Amoxicillin-Pot Clavulanate] Nausea Only   Bactrim [Sulfamethoxazole-Trimethoprim] Hives and Itching   Clarithromycin     GI upset Other reaction(s): GI Upset (intolerance) GI upset -can take but does cause mild upset    Cymbalta [Duloxetine Hcl]     itching   Hydroxyzine Swelling   Lyrica [Pregabalin]    Nexletol [Bempedoic Acid]     Muscle pain   Statins Other (See Comments)    Myalgias   Zetia [Ezetimibe]     Muscle pain    Family History  Problem Relation Age of Onset   Heart disease Mother    Heart disease Father    Diabetes Sister    Other Sister        BRAIN TUMOR   Heart disease Brother    Heart disease Brother    Heart disease Brother    Heart disease Brother    Heart disease Brother    Colon cancer Maternal Aunt    Colon cancer Cousin      Prior to Admission medications   Medication Sig Start Date End Date Taking? Authorizing Provider  ALPRAZolam (XANAX) 0.5 MG tablet TAKE 1 TABLET(0.5 MG) BY MOUTH TWICE DAILY AS NEEDED FOR ANXIETY 06/09/23   Sirivol, Kristie Cowman, MD  amitriptyline (ELAVIL) 25 MG tablet Take 1 tablet (25 mg total) by mouth at bedtime. 04/20/23   Renne Crigler, FNP  amLODipine (NORVASC) 5 MG tablet Take 5 mg by mouth daily.    [provider]  D-Mannose 350 MG CAPS Take 350 mg by mouth daily.    [provider]  FLUoxetine (PROZAC) 20 MG capsule Take 1 capsule (20 mg total) by mouth daily. 04/13/23   [name], Fritzi Mandes, MD  furosemide (LASIX) 40 MG tablet Take 1 tablet (40 mg total) by mouth daily. 03/02/23   [name], Fritzi Mandes, MD  gabapentin  (NEURONTIN) 300 MG capsule Take 2 capsules (600 mg total) by mouth 3 (three) times daily. 04/24/23   Levert Feinstein, MD  levothyroxine (SYNTHROID) 75 MCG tablet TAKE 1 TABLET(75 MCG) BY MOUTH DAILY 05/22/23   [name], Fritzi Mandes, MD  lidocaine-prilocaine (EMLA) cream 1 gram qid prn 04/24/23   Levert Feinstein, MD  metoprolol tartrate (LOPRESSOR) 50 MG tablet TAKE 1 TABLET(50 MG) BY MOUTH TWICE DAILY WITH MEALS 05/22/23   [name], Kirsten, MD  Multiple Vitamins-Minerals (MULTIVITAMIN WITH MINERALS) tablet Take 1 tablet by mouth  daily.    [provider]  nitroGLYCERIN (NITROSTAT) 0.4 MG SL tablet DISSOLVE ONE TABLET UNDER TONGUE AS NEEDED FOR CHEST PAIN EVERY 5 MINUTES FOR UP TO 3 DOSES 02/24/23   Windell Moment, MD  OXcarbazepine (TRILEPTAL) 150 MG tablet Take 1 tablet (150 mg total) by mouth 2 (two) times daily. 04/24/23   Levert Feinstein, MD  oxyCODONE-acetaminophen (PERCOCET) 10-325 MG tablet Take 1 tablet by mouth once.    [provider]  REPATHA SURECLICK 140 MG/ML SOAJ Inject 140 mg into the skin every 14 (fourteen) days.    [provider]  tirzepatide Greggory Keen) 10 MG/0.5ML Pen Inject 10 mg into the skin once a week. 04/03/23   [name], Fritzi Mandes, MD  tirzepatide [facility]) 12.5 MG/0.5ML Pen Inject 12.5 mg into the skin once a week for 16 doses. 04/20/23 08/04/23  Renne Crigler, FNP  valACYclovir (VALTREX) 1000 MG tablet Take 1 tablet (1,000 mg total) by mouth daily. 04/07/23   Blane Ohara, MD    Physical Exam: Vitals:   06/10/23 1645 06/10/23 1930 06/10/23 2058 06/10/23 2206  BP:  136/78  130/78  Pulse: 94 98  80  Resp: (!) 21 20  18   Temp:  98.2 F (36.8 C) 98.5 F (36.9 C) 97.6 F (36.4 C)  TempSrc:  Oral Oral Oral  SpO2: 97% 97%  97%  Weight:      Height:       Constitutional: Sitting up on the side of the bed.  Appears fatigued but in NAD, calm, comfortable Eyes: EOMI, lids and conjunctivae normal ENMT: Mucous membranes are dry. Posterior pharynx clear of any exudate or  lesions.Normal dentition.  Neck: normal, supple, no masses. Respiratory: Inspiratory crackles bilaterally upper and lower lung fields. Normal respiratory effort. No accessory muscle use.  Cardiovascular: Regular rate and rhythm, no murmurs / rubs / gallops. No extremity edema. 2+ pedal pulses. Abdomen: no tenderness, no masses palpated.  Musculoskeletal: no clubbing / cyanosis. No joint deformity upper and lower extremities. Good ROM, no contractures. Normal muscle tone.  Skin: no rashes, lesions, ulcers. No induration Neurologic: Sensation intact. Strength 5/5 in all 4.  Psychiatric: Normal judgment and insight. Alert and oriented x 3. Normal mood.   EKG: Personally reviewed. Sinus rhythm, rate 94, PAC, no acute ischemic changes.  Not significantly changed when compared to previous.  Assessment/Plan Principal Problem:   Acute respiratory failure with hypoxia (HCC) Active Problems:   Bronchiectasis with acute lower respiratory infection (HCC)   Secondary hypothyroidism   Coronary artery disease involving native coronary artery of native heart without angina pectoris   Chronic low back pain   GAD (generalized anxiety disorder)   Post herpetic neuralgia   Interstitial lung disease (HCC)   Courtney [name] is a 77 y.o. female with medical history significant for CAD s/p DES to LAD (12/2020), CKD stage IIIa, HTN, HLD, bronchiectasis, pulmonary fibrosis, hypothyroidism, postherpetic neuralgia, chronic pain syndrome, anxiety who is admitted with acute hypoxia in setting of bronchiectasis with superimposed lower respiratory tract infection.  Assessment and Plan: Acute respiratory failure with hypoxia Bronchiectasis with acute lower respiratory infection Pulmonary fibrosis/ILD: Presenting with leukocytosis, increased purulent sputum production.  SpO2 down to 84% on RA in the ED improved on 2 L O2 via [place].  Has known history of bronchiectasis and ILD, follows with pulmonology Dr. Isaiah Serge but has not  had recent follow-up. -Continue IV ceftriaxone and azithromycin for now -Start 3% saline nebulizers BID -Follow blood cultures -Check strep pneumonia and Legionella urinary antigens,  sputum culture -COVID, influenza, RSV negative -Continue supplemental O2 as needed -IS, FV, Mucinex  Hyponatremia: Sodium 128, likely from volume depletion/dehydration.  Receiving IV fluid hydration overnight.  Repeat labs in AM.  Hypokalemia: Oral supplement ordered.  CKD stage IIIa: Renal function stable.  Continue to monitor.  CAD s/p DES to LAD 12/2020: Stable, denies chest pain.  Does not appear to be on antiplatelet.  Reported history of statin intolerance, on Repatha as an outpatient.  Hypertension: Continue amlodipine and Lopressor.  Hold Lasix given dehydration.  Hypothyroidism: Continue Synthroid.  Postherpetic neuralgia: Continue Trileptal, gabapentin, Valtrex.  Anxiety: Continue Prozac and home Xanax as needed.  Chronic pain syndrome: Continue home Percocet as needed.   DVT prophylaxis: enoxaparin (LOVENOX) injection 40 mg Start: 06/11/23 0000 Code Status: Full code, confirmed with patient on admission Family Communication: Daughters at bedside Disposition Plan: From home and likely discharge to home pending clinical progress Consults called: None Severity of Illness: The appropriate patient status for this patient is INPATIENT. Inpatient status is judged to be reasonable and necessary in order to provide the required intensity of service to ensure the patient's safety. The patient's presenting symptoms, physical exam findings, and initial radiographic and laboratory data in the context of their chronic comorbidities is felt to place them at high risk for further clinical deterioration. Furthermore, it is not anticipated that the patient will be medically stable for discharge from the hospital within 2 midnights of admission.   * I certify that at the point of admission it is my  clinical judgment that the patient will require inpatient hospital care spanning beyond 2 midnights from the point of admission due to high intensity of service, high risk for further deterioration and high frequency of surveillance required.Darreld Mclean MD [facility]  If 7PM-7AM, please contact night-coverage [email or web address]  06/10/2023, 11:21 PM

## 2023-06-11 DIAGNOSIS — J470 Bronchiectasis with acute lower respiratory infection: Secondary | ICD-10-CM | POA: Diagnosis not present

## 2023-06-11 DIAGNOSIS — I2510 Atherosclerotic heart disease of native coronary artery without angina pectoris: Secondary | ICD-10-CM

## 2023-06-11 DIAGNOSIS — J849 Interstitial pulmonary disease, unspecified: Secondary | ICD-10-CM | POA: Diagnosis not present

## 2023-06-11 DIAGNOSIS — E871 Hypo-osmolality and hyponatremia: Secondary | ICD-10-CM | POA: Diagnosis not present

## 2023-06-11 DIAGNOSIS — G8929 Other chronic pain: Secondary | ICD-10-CM

## 2023-06-11 DIAGNOSIS — B0229 Other postherpetic nervous system involvement: Secondary | ICD-10-CM

## 2023-06-11 DIAGNOSIS — M545 Low back pain, unspecified: Secondary | ICD-10-CM

## 2023-06-11 DIAGNOSIS — E876 Hypokalemia: Secondary | ICD-10-CM

## 2023-06-11 DIAGNOSIS — E039 Hypothyroidism, unspecified: Secondary | ICD-10-CM

## 2023-06-11 DIAGNOSIS — J9601 Acute respiratory failure with hypoxia: Secondary | ICD-10-CM | POA: Diagnosis not present

## 2023-06-11 DIAGNOSIS — I10 Essential (primary) hypertension: Secondary | ICD-10-CM

## 2023-06-11 LAB — RESPIRATORY PANEL BY PCR

## 2023-06-11 LAB — COMPREHENSIVE METABOLIC PANEL
ALT: 63 U/L — ABNORMAL HIGH (ref 0–44)
AST: 23 U/L (ref 15–41)
Albumin: 2.4 g/dL — ABNORMAL LOW (ref 3.5–5.0)
Alkaline Phosphatase: 150 U/L — ABNORMAL HIGH (ref 38–126)
Anion gap: 11 (ref 5–15)
BUN: 15 mg/dL (ref 8–23)
CO2: 26 mmol/L (ref 22–32)
Calcium: 8.9 mg/dL (ref 8.9–10.3)
Chloride: 95 mmol/L — ABNORMAL LOW (ref 98–111)
Creatinine, Ser: 0.97 mg/dL (ref 0.44–1.00)
GFR, Estimated: >60 mL/min (ref >60)
Glucose, Bld: 175 mg/dL — ABNORMAL HIGH (ref 70–99)
Potassium: 3.8 mmol/L (ref 3.5–5.1)
Sodium: 132 mmol/L — ABNORMAL LOW (ref 135–145)
Total Bilirubin: 0.4 mg/dL (ref <1.2)
Total Protein: 6 g/dL — ABNORMAL LOW (ref 6.5–8.1)

## 2023-06-11 LAB — CBC
HCT: 35.2 % — ABNORMAL LOW (ref 36.0–46.0)
Hemoglobin: 11.7 g/dL — ABNORMAL LOW (ref 12.0–15.0)
MCH: 31.5 pg (ref 26.0–34.0)
MCHC: 33.2 g/dL (ref 30.0–36.0)
MCV: 94.9 fL (ref 80.0–100.0)
Platelets: 185 10*3/uL (ref 150–400)
RBC: 3.71 MIL/uL — ABNORMAL LOW (ref 3.87–5.11)
RDW: 12.6 % (ref 11.5–15.5)
WBC: 6.6 10*3/uL (ref 4.0–10.5)
nRBC: 0 % (ref 0.0–0.2)

## 2023-06-11 LAB — STREP PNEUMONIAE URINARY ANTIGEN: Strep Pneumo Urinary Antigen: NEGATIVE

## 2023-06-11 LAB — URINE CULTURE: Culture: <10000 — AB

## 2023-06-11 LAB — PROCALCITONIN: Procalcitonin: 1.37 ng/mL

## 2023-06-11 MED ORDER — DIPHENHYDRAMINE HCL 50 MG/ML IJ SOLN
25.0000 mg | Freq: Once | INTRAMUSCULAR | Status: DC
Start: 2023-06-11 — End: 2023-06-12
  Filled 2023-06-11: qty 1

## 2023-06-11 MED ORDER — SODIUM CHLORIDE 3 % IN NEBU
4.0000 mL | INHALATION_SOLUTION | Freq: Two times a day (BID) | RESPIRATORY_TRACT | Status: DC
  Administered 2023-06-11: 4 mL via RESPIRATORY_TRACT
  Filled 2023-06-11 (×4): qty 4

## 2023-06-11 MED ORDER — DIPHENHYDRAMINE HCL 50 MG/ML IJ SOLN
25.0000 mg | Freq: Once | INTRAMUSCULAR | Status: AC
  Administered 2023-06-11: 25 mg via INTRAVENOUS
  Filled 2023-06-11: qty 1

## 2023-06-11 MED ORDER — CIPROFLOXACIN HCL 500 MG PO TABS
500.0000 mg | ORAL_TABLET | Freq: Two times a day (BID) | ORAL | Status: DC
  Filled 2023-06-11: qty 1

## 2023-06-11 NOTE — Assessment & Plan Note (Signed)
Resolved with repletion. ?

## 2023-06-11 NOTE — Progress Notes (Signed)
Mobility Specialist Progress Note:   06/11/23 1020  Mobility  Activity Ambulated with assistance in hallway  Level of Assistance Standby assist, set-up cues, supervision of patient - no hands on  Assistive Device None  Distance Ambulated (ft) 400 ft  Activity Response Tolerated well  Mobility Referral Yes  Mobility visit 1 Mobility  Mobility Specialist Start Time (ACUTE ONLY) 1020  Mobility Specialist Stop Time (ACUTE ONLY) 1030  Mobility Specialist Time Calculation (min) (ACUTE ONLY) 10 min   Pt agreeable to mobility session. Required no physical assistance throughout ambulation. Ambulated on RA, SpO2 94-100%. No c/o throughout, back in bed with all needs met.   Addison Lank Mobility Specialist Please contact via SecureChat or  Rehab office at [phone]

## 2023-06-11 NOTE — Assessment & Plan Note (Signed)
Bronchiectasis with acute lower respiratory infection Pulmonary fibrosis/ILD: CTA negative for PE but did show some concern of overlying alveolitis.  COVID-19 PCR and influenza negative. Procalcitonin at 1.37 Checking respiratory viral panel. -Continue with ceftriaxone and Zithromax -Continue with 3% saline nebulizers twice daily -Continue with supportive care -Continue supplemental oxygen-wean as tolerated

## 2023-06-11 NOTE — Progress Notes (Signed)
PHARMACY ANTIBIOTIC CONSULT NOTE   Courtney [name] a 77 y.o. female admitted on 06/10/23 with pneumonia.  Patient reported allergic reaction to Rocephin (flushing and slight chest pressure per RN) and lost IV access.  Pharmacy has been consulted for levofloxacin dosing.  Levofloxacin allergy noted from 07/2014 but has tolerated ciprofloxacin on several occasions since then.  Has received one day of azithromycin, two days of Rocephin.    12/22: Scr 0.97, WBC 6.6  Vital Signs: Tm 98.31F, HR 90, BP 145/95  Estimated Creatinine Clearance: 48.5 mL/min (by C-G formula based on SCr of 0.97 mg/dL).  Plan: START Ciprofloxacin PO 500 mg Q12h on 12/23 x3 days   Allergies:  Allergies  Allergen Reactions   Levofloxacin Hives, Shortness Of Breath, Swelling and Other (See Comments)    Tolerates Ciprofloxacin RASH IN MOUTH   (can take IV route)   Augmentin [Amoxicillin-Pot Clavulanate] Nausea Only   Bactrim [Sulfamethoxazole-Trimethoprim] Hives and Itching   Clarithromycin     GI upset Other reaction(s): GI Upset (intolerance) GI upset -can take but does cause mild upset    Cymbalta [Duloxetine Hcl]     itching   Hydroxyzine Swelling   Lyrica [Pregabalin]    Nexletol [Bempedoic Acid]     Muscle pain   Statins Other (See Comments)    Myalgias   Zetia [Ezetimibe]     Muscle pain    Filed Weights   06/10/23 1143  Weight: 72.6 kg (160 lb)       Latest Ref Rng & Units 06/11/2023    6:00 AM 06/10/2023    1:35 PM 03/03/2023    7:28 AM  CBC  WBC 4.0 - 10.5 K/uL 6.6  13.0  9.8   Hemoglobin 12.0 - 15.0 g/dL 16.0  10.9  32.3   Hematocrit 36.0 - 46.0 % 35.2  32.5  32.2   Platelets 150 - 400 K/uL 185  165  224     Antibiotics Given (last 72 hours)     Date/Time Action Medication Dose Rate   06/10/23 1525 New Bag/Given   cefTRIAXone (ROCEPHIN) 1 g in sodium chloride 0.9 % 100 mL IVPB 1 g 200 mL/hr   06/10/23 1639 New Bag/Given  [Medications ordered not compatible]   azithromycin  (ZITHROMAX) 500 mg in sodium chloride 0.9 % 250 mL IVPB 500 mg 250 mL/hr   06/11/23 0849 Given   valACYclovir (VALTREX) tablet 1,000 mg 1,000 mg    06/11/23 1630 New Bag/Given   cefTRIAXone (ROCEPHIN) 1 g in sodium chloride 0.9 % 100 mL IVPB 1 g 200 mL/hr       Antimicrobials this admission: Rocephin 1gm 12/21 > 12/22 Azithromycin 12/21 Ciprofloxacin 12/23 > c   Thank you for allowing pharmacy to be a part of this patient's care.  Trixie Rude, PharmD Clinical Pharmacist 06/11/2023  10:00 PM

## 2023-06-11 NOTE — Assessment & Plan Note (Signed)
Continue amlodipine and Lopressor. Hold Lasix given dehydration.

## 2023-06-11 NOTE — Progress Notes (Signed)
Dr. Hall at bedside.

## 2023-06-11 NOTE — Assessment & Plan Note (Signed)
-  Continue home Percocet as needed

## 2023-06-11 NOTE — Progress Notes (Signed)
Writer went into assess for PIV access. Pt was very upset when Clinical research associate entered room. Pt ask can the doctor be contacted to see if patient medication can be change to PO. Primary nurse made aware.

## 2023-06-11 NOTE — Progress Notes (Signed)
Responded to consult for IV. Dr. Margo Aye in room and instructed to hold on IV access. VAS Team available for any future needs.

## 2023-06-11 NOTE — Progress Notes (Signed)
Per day RN, pt had an allergic reaction to Rocephin. Pt requesting PO antibiotics and refusal IV until speaking with MD. OC made aware.

## 2023-06-11 NOTE — Assessment & Plan Note (Signed)
Continue Synthroid °

## 2023-06-11 NOTE — Assessment & Plan Note (Signed)
S/p PCI to LAD on 7/22. Stable with no chest pain.  Does not appear to be on antiplatelet. Statin intolerance, on Repatha at outpatient.

## 2023-06-11 NOTE — Assessment & Plan Note (Signed)
-  Continue home Prozac and as needed Xanax

## 2023-06-11 NOTE — Progress Notes (Signed)
Progress Note   Patient: Courtney [name]:[id number] DOB: 06/19/1946 DOA: 06/10/2023     1 DOS: the patient was seen and examined on 06/11/2023   Brief hospital course: Courtney [name] is a 77 y.o. female with medical history significant for CAD s/p DES to LAD (12/2020), CKD stage IIIa, HTN, HLD, bronchiectasis, pulmonary fibrosis, hypothyroidism, postherpetic neuralgia, chronic pain syndrome, anxiety presented with worsening shortness of breath and weakness, and is admitted with acute hypoxia in setting of bronchiectasis with superimposed lower respiratory tract infection.  Patient recently went to urgent care, diagnosed with UTI without any urinary symptoms and started on ciprofloxacin, she did took 2 doses without any significant improvement.  On presentation patient had mild tachycardia and she was hypoxic to 84% requiring up to 2 L of oxygen.  Labs with leukocytosis at 13, sodium 128, potassium 3.3, no lactic acidosis.SARS-CoV-2, influenza, RSV PCR negative. Blood and urine cultures were drawn. CTA chest negative for PE, showed chronic lung disease with pulmonary fibrosis, progressive areas of groundglass opacity which could suggest superimposed acute alveolitis.  Small bilateral pleural effusion and stable mediastinal and hilar lymphadenopathy.  Patient received Solu-Medrol along with ceftriaxone and Zithromax in ED.  12/22: Vital stable, saturating 98% on 2 L of oxygen.  Labs with improved sodium to 132, some improvement of ALT and alkaline phosphatase, leukocytosis resolved, strep pneumo negative. Checking respiratory viral panel and procalcitonin was 1.37  Assessment and Plan: * Acute respiratory failure with hypoxia (HCC) Bronchiectasis with acute lower respiratory infection Pulmonary fibrosis/ILD: CTA negative for PE but did show some concern of overlying alveolitis.  COVID-19 PCR and influenza negative. Procalcitonin at 1.37 Checking respiratory viral panel. -Continue with  ceftriaxone and Zithromax -Continue with 3% saline nebulizers twice daily -Continue with supportive care -Continue supplemental oxygen-wean as tolerated  Hyponatremia Likely from volume depletion/dehydration.  Improving with IV fluid. -Continue to monitor  Hypokalemia Resolved with repletion.  Coronary artery disease involving native coronary artery of native heart without angina pectoris S/p PCI to LAD on 7/22. Stable with no chest pain.  Does not appear to be on antiplatelet. Statin intolerance, on Repatha at outpatient.  Hypertension Continue amlodipine and Lopressor. Hold Lasix given dehydration.   Post herpetic neuralgia Continue Trileptal, gabapentin, Valtrex.   Chronic low back pain Continue home Percocet as needed.   Hypothyroidism -Continue Synthroid  GAD (generalized anxiety disorder) -Continue home Prozac and as needed Xanax   Subjective: Patient was feeling somewhat improved when seen today.  Continue to have some cough.  No shortness of breath.  No urinary symptoms.  Physical Exam: Vitals:   06/10/23 2058 06/10/23 2206 06/11/23 0538 06/11/23 0853  BP:  130/78 139/74 (!) 131/91  Pulse:  80 73 (!) 117  Resp:  18 18 18   Temp: 98.5 F (36.9 C) 97.6 F (36.4 C) (!) 97.4 F (36.3 C) 98 F (36.7 C)  TempSrc: Oral Oral Oral Oral  SpO2:  97% 98% 95%  Weight:      Height:       General.  Frail elderly lady, in no acute distress. Pulmonary.  Lungs clear bilaterally, normal respiratory effort. CV.  Regular rate and rhythm, no JVD, rub or murmur. Abdomen.  Soft, nontender, nondistended, BS positive. CNS.  Alert and oriented .  No focal neurologic deficit. Extremities.  No edema, no cyanosis, pulses intact and symmetrical. Psychiatry.  Judgment and insight appears normal.   Data Reviewed: Prior data reviewed  Family Communication: Talked with daughter on phone.  Disposition: Status  is: Inpatient Remains inpatient appropriate because: Severity of  illness  Planned Discharge Destination: Home  DVT prophylaxis.  Lovenox Time spent: 50 minutes  This record has been created using Conservation officer, historic buildings. Errors have been sought and corrected,but may not always be located. Such creation errors do not reflect on the standard of care.   Author: Arnetha Courser, MD 06/11/2023 12:42 PM  For on call review [email or web address].

## 2023-06-11 NOTE — Progress Notes (Signed)
RT arrived bedside this morning to give patient hypertonic saline neb and attempt to help patient obtain a sputum. RT checked pyxis, med room and patients room and hypertonic saline is not available yet. RT notified pharmacy and will attempt again later.

## 2023-06-11 NOTE — Plan of Care (Signed)
  Problem: Education: Goal: Knowledge of General Education information will improve Description: Including pain rating scale, medication(s)/side effects and non-pharmacologic comfort measures Outcome: Progressing   Problem: Health Behavior/Discharge Planning: Goal: Ability to manage health-related needs will improve Outcome: Progressing   Problem: Clinical Measurements: Goal: Ability to maintain clinical measurements within normal limits will improve Outcome: Progressing Goal: Will remain free from infection Outcome: Progressing Goal: Diagnostic test results will improve Outcome: Progressing Goal: Respiratory complications will improve Outcome: Progressing Goal: Cardiovascular complication will be avoided Outcome: Progressing   Problem: Activity: Goal: Risk for activity intolerance will decrease Outcome: Progressing   Problem: Nutrition: Goal: Adequate nutrition will be maintained Outcome: Progressing   Problem: Coping: Goal: Level of anxiety will decrease Outcome: Progressing   Problem: Elimination: Goal: Will not experience complications related to bowel motility Outcome: Progressing Goal: Will not experience complications related to urinary retention Outcome: Progressing   Problem: Pain Management: Goal: General experience of comfort will improve Outcome: Progressing   Problem: Safety: Goal: Ability to remain free from injury will improve Outcome: Progressing   Problem: Skin Integrity: Goal: Risk for impaired skin integrity will decrease Outcome: Progressing   Problem: Activity: Goal: Ability to tolerate increased activity will improve Outcome: Progressing   Problem: Clinical Measurements: Goal: Ability to maintain a body temperature in the normal range will improve Outcome: Progressing   Problem: Respiratory: Goal: Ability to maintain adequate ventilation will improve Outcome: Progressing Goal: Ability to maintain a clear airway will improve Outcome:  Progressing   Problem: Activity: Goal: Ability to tolerate increased activity will improve Outcome: Progressing   Problem: Clinical Measurements: Goal: Ability to maintain a body temperature in the normal range will improve Outcome: Progressing   Problem: Respiratory: Goal: Ability to maintain adequate ventilation will improve Outcome: Progressing Goal: Ability to maintain a clear airway will improve Outcome: Progressing

## 2023-06-11 NOTE — Assessment & Plan Note (Signed)
Continue Trileptal, gabapentin, Valtrex.

## 2023-06-11 NOTE — Assessment & Plan Note (Signed)
Likely from volume depletion/dehydration.  Improving with IV fluid. -Continue to monitor

## 2023-06-11 NOTE — Progress Notes (Signed)
Notified Dr. Arnetha Courser and Pharmacist Jani Gravel of patient reaction to Ceftriaxone. Pt complained of flushing and slight chest pressure. Orders received.

## 2023-06-12 MED ORDER — DIPHENHYDRAMINE HCL 25 MG PO CAPS
25.0000 mg | ORAL_CAPSULE | ORAL | Status: AC
  Administered 2023-06-12: 25 mg via ORAL
  Filled 2023-06-12: qty 1

## 2023-06-12 MED ORDER — DIPHENHYDRAMINE HCL 25 MG PO CAPS: 25.0000 mg | ORAL_CAPSULE | Freq: Four times a day (QID) | ORAL | Status: DC | PRN

## 2023-06-12 MED ORDER — CIPROFLOXACIN HCL 500 MG PO TABS
500.0000 mg | ORAL_TABLET | Freq: Two times a day (BID) | ORAL | Status: DC
  Filled 2023-06-12 (×2): qty 1

## 2023-06-12 NOTE — Progress Notes (Signed)
Pt requested to leave. Brought AMA paperwork for pt to sign and leave. Pt refused to sign paperwork and has left.

## 2023-06-12 NOTE — Progress Notes (Addendum)
Received a call from bedside RN regarding the patient not having IV access, refusing another attempt, and wanting her IV antibiotics switched to oral antibiotics.    Presented at bedside.  There were faint rales at bases with lungs auscultation, rest of exam was unremarkable with O2 saturation 98% on room air.  The patient is alert, in no acute distress, upset about multiple IV access attempts.  States that the plan with the day team attending is that she will be switched to oral antibiotics in the morning and be discharged home.   The patient has multiple allergies, consulted pharmacy to look at her medications history.  It appears the patient has tolerated ciprofloxacin in the past, therefore, p.o. ciprofloxacin was ordered.    The patient complained of itching and requested Atarax which is listed as one of her allergies.  Benadryl was ordered instead.  Received another call from bedside RN regarding the patient wanting to leave AGAINST MEDICAL ADVICE.  The patient left before the writer was able to return to her room.   Time: 15 minutes.

## 2023-06-13 LAB — LEGIONELLA PNEUMOPHILA SEROGP 1 UR AG: L. pneumophila Serogp 1 Ur Ag: NEGATIVE

## 2023-06-15 ENCOUNTER — Other Ambulatory Visit: Payer: Self-pay

## 2023-06-15 LAB — CULTURE, BLOOD (ROUTINE X 2)
Culture: NO GROWTH
Culture: NO GROWTH
Special Requests: ADEQUATE
Special Requests: ADEQUATE

## 2023-06-15 MED ORDER — HYDROXYZINE PAMOATE 25 MG PO CAPS: 25.0000 mg | ORAL_CAPSULE | Freq: Four times a day (QID) | ORAL | 1 refills | Status: DC | PRN

## 2023-06-15 MED ORDER — AMLODIPINE BESYLATE 5 MG PO TABS: 5.0000 mg | ORAL_TABLET | Freq: Every day | ORAL | 0 refills | Status: DC

## 2023-06-15 NOTE — Telephone Encounter (Signed)
Copied from CRM [id number]. Topic: Clinical - Medication Refill >> Jun 13, 2023  8:36 AM Herbert Seta B wrote: Most Recent Primary Care Visit:  Provider: Renne Crigler  Department: [name]  Visit Type: SAME DAY  Date: 04/20/2023  Medication: 1-hydrOXYzine (VISTARIL) 25 MG capsule   2-amLODipine (NORVASC) 5 MG tablet    Has the patient contacted their pharmacy? Yes-need refills sent (Agent: If no, request that the patient contact the pharmacy for the refill. If patient does not wish to contact the pharmacy document the reason why and proceed with request.) (Agent: If yes, when and what did the pharmacy advise?)  Is this the correct pharmacy for this prescription? yes If no, delete pharmacy and type the correct one.  This is the patient's preferred pharmacy:  [id number] Rosalita Levan, [place] - [address] AT [facility] [address] [place] [address] Phone: [phone] Fax: [phone]   Has the prescription been filled recently? yes  Is the patient out of the medication? yes  Has the patient been seen for an appointment in the last year OR does the patient have an upcoming appointment? yes  Can we respond through MyChart? yes  Agent: Please be advised that Rx refills may take up to 3 business days. We ask that you follow-up with your pharmacy.

## 2023-06-15 NOTE — Telephone Encounter (Signed)
Patient was in [facility] 12/21-12/23. Made follow up with Huston Foley for 06/21/22, she stated she is going to call and cancel GI appointment

## 2023-06-20 NOTE — Discharge Instructions (Signed)

## 2023-06-22 ENCOUNTER — Inpatient Hospital Stay
Admission: RE | Admit: 2023-06-22 | Discharge: 2023-06-22 | Disposition: A | Discharge status: Home / Self Care | Payer: Medicare Other | Source: Ambulatory Visit | Attending: Neurosurgery | Admitting: Neurosurgery

## 2023-06-22 ENCOUNTER — Other Ambulatory Visit: Payer: Medicare Other

## 2023-06-22 ENCOUNTER — Encounter: Payer: Medicare Other | Admitting: Physician Assistant

## 2023-06-22 NOTE — Progress Notes (Deleted)
 Subjective:  Patient ID: Courtney [name], female    DOB: 04-Jul-1945  Age: 78 y.o. MRN: [id number]  No chief complaint on file.   HPI   Patient is here for transition of care. She was admitted at [facility] on 06/10/23 and discharged on 06/12/2023 for acute respiratory failure with hypoxia.     03/20/2023   11:25 AM 12/12/2022    3:29 PM 07/06/2022    9:45 AM 04/25/2022    8:57 AM 02/08/2022    9:18 AM  Depression screen PHQ 2/9  Decreased Interest 3 3 3  0 0  Down, Depressed, Hopeless 3 3 3  0 0  PHQ - 2 Score 6 6 6  0 0  Altered sleeping 3 3 3  0 2  Tired, decreased energy 3 3 3 3 3   Change in appetite 0 0 1  0  Feeling bad or failure about yourself  0 0 3 0 2  Trouble concentrating 1 2 3  0 0  Moving slowly or fidgety/restless 0 0 0 0 0  Suicidal thoughts 0 0 0 0 0  PHQ-9 Score 13 14 19 3 7   Difficult doing work/chores Somewhat difficult Not difficult at all Very difficult Not difficult at all         12/12/2022    3:29 PM  Fall Risk   Falls in the past year? 0  Number falls in past yr: 0  Injury with Fall? 0  Risk for fall due to : No Fall Risks  Follow up Falls evaluation completed    Patient Care Team: Sherre Clapper, MD as PCP - General (Internal Medicine) Dann Candyce RAMAN, MD as PCP - Cardiology (Cardiology) McGukin, Lynwood FABIENE Raddle., MD (Cardiology) Theophilus Roosevelt, MD as Consulting Physician (Pulmonary Disease) Kennedy, Nathan K, [facility] (Inactive) (Pharmacist)   Review of Systems  Current Outpatient Medications on File Prior to Visit  Medication Sig Dispense Refill   ALPRAZolam  (XANAX ) 0.5 MG tablet TAKE 1 TABLET(0.5 MG) BY MOUTH TWICE DAILY AS NEEDED FOR ANXIETY 60 tablet 0   amitriptyline  (ELAVIL ) 25 MG tablet Take 1 tablet (25 mg total) by mouth at bedtime. (Patient not taking: Reported on 06/11/2023) 30 tablet 2   amLODipine  (NORVASC ) 5 MG tablet Take 1 tablet (5 mg total) by mouth daily. 90 tablet 0   Ascorbic Acid (VITAMIN C) 1000 MG tablet  Take 1,000 mg by mouth daily.     Cholecalciferol (D3 PO) Take 2 tablets by mouth daily. chewable     ciprofloxacin  (CIPRO ) 500 MG tablet Take 500 mg by mouth 2 (two) times daily.     DULoxetine  (CYMBALTA ) 60 MG capsule Take 60 mg by mouth 2 (two) times daily.     FLUoxetine  (PROZAC ) 20 MG capsule Take 1 capsule (20 mg total) by mouth daily. 90 capsule 1   furosemide  (LASIX ) 20 MG tablet Take 20 mg by mouth daily.     furosemide  (LASIX ) 40 MG tablet Take 1 tablet (40 mg total) by mouth daily. 30 tablet 3   gabapentin  (NEURONTIN ) 300 MG capsule Take 2 capsules (600 mg total) by mouth 3 (three) times daily. (Patient taking differently: Take 600 mg by mouth 3 (three) times daily as needed (pain).) 180 capsule 11   hydrOXYzine  (VISTARIL ) 25 MG capsule Take 1 capsule (25 mg total) by mouth every 6 (six) hours as needed for itching. 30 capsule 1   levothyroxine  (SYNTHROID ) 75 MCG tablet TAKE 1 TABLET(75 MCG) BY MOUTH DAILY 90 tablet 1   lidocaine  (  LIDODERM ) 5 % Place 1 patch onto the skin daily as needed (pain). Remove & Discard patch within 12 hours or as directed by MD     lidocaine -prilocaine  (EMLA ) cream 1 gram qid prn (Patient taking differently: Apply 1 Application topically 4 (four) times daily as needed (pain).) 30 g 6   lisinopril  (ZESTRIL ) 2.5 MG tablet Take 2.5 mg by mouth daily.     MAGNESIUM PO Take 1 tablet by mouth daily. chewable     meloxicam (MOBIC) 15 MG tablet Take 15 mg by mouth daily as needed for pain.     metoprolol  tartrate (LOPRESSOR ) 50 MG tablet TAKE 1 TABLET(50 MG) BY MOUTH TWICE DAILY WITH MEALS 180 tablet 1   montelukast  (SINGULAIR ) 10 MG tablet Take 10 mg by mouth daily as needed (allergies).     naloxone (NARCAN) nasal spray 4 mg/0.1 mL Place 1 spray into the nose once. In case of opioid overdose.     nitroGLYCERIN  (NITROSTAT ) 0.4 MG SL tablet DISSOLVE ONE TABLET UNDER TONGUE AS NEEDED FOR CHEST PAIN EVERY 5 MINUTES FOR UP TO 3 DOSES 25 tablet 1   omeprazole  (PRILOSEC)  40 MG capsule Take 40 mg by mouth daily as needed (reflux).     ondansetron  (ZOFRAN -ODT) 8 MG disintegrating tablet Take 8 mg by mouth every 8 (eight) hours as needed for nausea.     OXcarbazepine  (TRILEPTAL ) 150 MG tablet Take 1 tablet (150 mg total) by mouth 2 (two) times daily. (Patient not taking: Reported on 06/11/2023) 60 tablet 11   oxyCODONE -acetaminophen  (PERCOCET) 10-325 MG tablet Take 1 tablet by mouth See admin instructions. Take 1 tablet by mouth 5 times a day. May skip a dose if not needed.     phenazopyridine  (PYRIDIUM ) 200 MG tablet Take 200 mg by mouth every 8 (eight) hours as needed.     REPATHA SURECLICK 140 MG/ML SOAJ Inject 140 mg into the skin every 14 (fourteen) days.     RESTASIS 0.05 % ophthalmic emulsion Place 1 drop into both eyes 2 (two) times daily as needed (dryness).     tirzepatide  (MOUNJARO ) 10 MG/0.5ML Pen Inject 10 mg into the skin once a week. (Patient not taking: Reported on 06/11/2023) 6 mL 0   tirzepatide  (MOUNJARO ) 12.5 MG/0.5ML Pen Inject 12.5 mg into the skin once a week for 16 doses. 2 mL 3   tiZANidine  (ZANAFLEX ) 4 MG tablet Take 4 mg by mouth at bedtime as needed for muscle spasms.     tretinoin  (RETIN-A ) 0.025 % gel Apply 1 Application topically at bedtime.     valACYclovir  (VALTREX ) 1000 MG tablet Take 1 tablet (1,000 mg total) by mouth daily. 90 tablet 0   No current facility-administered medications on file prior to visit.   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 07/25/2014   Followed in Pulmonary clinic/ [facility]/ [place]    Cardiac dysrhythmia    Chronic idiopathic constipation    Colitis, acute 02/08/2021   Coronary artery disease    Depression, major, recurrent, moderate (HCC)    Headache    Heart murmur    History of bladder infections    History of COVID-19    Hyperlipidemia    Hypertension    Hypothyroidism    Knee  pain, bilateral    Melena 02/08/2021   Osteoarthritis    Pneumonia    Primary insomnia  Recovering alcoholic (HCC)    SVT (supraventricular tachycardia) (HCC)    UTI (urinary tract infection)    Varicose veins    Vitamin B12 deficiency    Past Surgical History:  Procedure Laterality Date   ABDOMINAL HYSTERECTOMY  1991   APPENDECTOMY     BACK SURGERY     had 2 surgeries. Have rods placed in back    BREAST BIOPSY Left    x2   BREAST ENHANCEMENT SURGERY  2006   CARDIAC CATHETERIZATION  12/22/2020   stents placed   CHOLECYSTECTOMY  12/2019   removed lap band.    COLONOSCOPY  12/01/2016   Moderate predominantly sigmod diverticulosis. Otherwise grossly normal colonoscopy   EYE SURGERY     IOL- bilateral - [place]   KNEE ARTHROSCOPY Left    lap band surgery  1981   LUMBAR FUSION  07/29/2015   posterior level one   removal of cervical disc fragments  10/2007   pt. denies    TUBAL LIGATION  1970    Family History  Problem Relation Age of Onset   Heart disease Mother    Heart disease Father    Diabetes Sister    Other Sister        BRAIN TUMOR   Heart disease Brother    Heart disease Brother    Heart disease Brother    Heart disease Brother    Heart disease Brother    Colon cancer Maternal Aunt    Colon cancer Cousin    Social History   Socioeconomic History   Marital status: Widowed    Spouse name: Not on file   Number of children: 2   Years of education: Not on file   Highest education level: Not on file  Occupational History   Occupation: retired    Comment: nurse  Tobacco Use   Smoking status: Never   Smokeless tobacco: Never  Vaping Use   Vaping status: Never Used  Substance and Sexual Activity   Alcohol use: No    Alcohol/week: 0.0 standard drinks of alcohol    Comment: recovery x 20 yrs   Drug use: No   Sexual activity: Not Currently    Comment: MARRIED  Other Topics Concern   Not on file  Social History Narrative   Not on file   Social  Drivers of Health   Financial Resource Strain: Low Risk  (12/12/2022)   Overall Financial Resource Strain (CARDIA)    Difficulty of Paying Living Expenses: Not hard at all  Food Insecurity: No Food Insecurity (06/10/2023)   Hunger Vital Sign    Worried About Running Out of Food in the Last Year: Never true    Ran Out of Food in the Last Year: Never true  Transportation Needs: No Transportation Needs (06/10/2023)   PRAPARE - Administrator, Civil Service (Medical): No    Lack of Transportation (Non-Medical): No  Physical Activity: Inactive (12/12/2022)   Exercise Vital Sign    Days of Exercise per Week: 0 days    Minutes of Exercise per Session: 0 min  Stress: No Stress Concern Present (12/12/2022)   [employer] of Occupational Health - Occupational Stress Questionnaire    Feeling of Stress : Not at all  Social Connections: Moderately Isolated (12/12/2022)   Social Connection and Isolation Panel [NHANES]    Frequency of Communication with Friends and Family: Three times a week    Frequency of Social Gatherings with Friends and Family: Three times a week  Attends Religious Services: More than 4 times per year    Active Member of Clubs or Organizations: No    Attends Banker Meetings: Never    Marital Status: Widowed    Objective:  There were no vitals taken for this visit.     06/11/2023    8:23 PM 06/11/2023    5:20 PM 06/11/2023   12:49 PM  BP/Weight  Systolic BP 145 146 136  Diastolic BP 95 85 66    Physical Exam  Diabetic Foot Exam - Simple   No data filed      Lab Results  Component Value Date   WBC 6.6 06/11/2023   HGB 11.7 (L) 06/11/2023   HCT 35.2 (L) 06/11/2023   PLT 185 06/11/2023   GLUCOSE 175 (H) 06/11/2023   CHOL 210 (H) 12/12/2022   TRIG 181 (H) 12/12/2022   HDL 56 12/12/2022   LDLCALC 122 (H) 12/12/2022   ALT 63 (H) 06/11/2023   AST 23 06/11/2023   NA 132 (L) 06/11/2023   K 3.8 06/11/2023   CL 95 (L) 06/11/2023    CREATININE 0.97 06/11/2023   BUN 15 06/11/2023   CO2 26 06/11/2023   TSH 2.110 03/02/2023   INR 1.0 06/10/2023   HGBA1C 5.6 12/12/2022      Assessment & Plan:    There are no diagnoses linked to this encounter.   No orders of the defined types were placed in this encounter.   No orders of the defined types were placed in this encounter.    Follow-up: No follow-ups on file.   I,Tirzah Fross I Leal-Borjas,acting as a scribe for [employer], PA.,have documented all relevant documentation on the behalf of Nola Angles, PA,as directed by  Nola Angles, PA while in the presence of Nola Angles, [place].   An After Visit Summary was printed and given to the patient.  Nola Angles, [place] [name] [phone]

## 2023-06-26 ENCOUNTER — Telehealth: Payer: Self-pay | Admitting: Neurology

## 2023-06-26 NOTE — Telephone Encounter (Signed)
 Pt said OXcarbazepine (TRILEPTAL) 150 MG tablet is not helping, still having itching in the head. Would like a call from the nurse to discuss what to do.

## 2023-06-27 MED ORDER — AMITRIPTYLINE HCL 10 MG PO TABS: 10.0000 mg | ORAL_TABLET | Freq: Two times a day (BID) | ORAL | 6 refills | Status: DC | PRN

## 2023-06-27 NOTE — Progress Notes (Signed)
 This encounter was created in error - please disregard.

## 2023-06-27 NOTE — Telephone Encounter (Signed)
 Let her try  Meds ordered this encounter  Medications   amitriptyline (ELAVIL) 10 MG tablet    Sig: Take 1 tablet (10 mg total) by mouth 2 (two) times daily as needed for sleep.    Dispense:  60 tablet    Refill:  6

## 2023-06-27 NOTE — Telephone Encounter (Signed)
 Call to patient, she reports medication compliance with gabapentin  and trileptal  and still having itching in her scalp. Sh e reports she doesn't go out in public due to the severe itching. She is asking if there is another medication to try. Advised I will send to Dr. Onita for review. Patient appreciative of call.

## 2023-06-28 NOTE — Addendum Note (Signed)
 Addended by: Lenn Cal on: 06/28/2023 07:43 AM   Modules accepted: Orders

## 2023-06-29 NOTE — Discharge Instructions (Signed)

## 2023-06-30 ENCOUNTER — Inpatient Hospital Stay
Admission: RE | Admit: 2023-06-30 | Discharge: 2023-06-30 | Disposition: A | Discharge status: Home / Self Care | Payer: Medicare Other | Source: Ambulatory Visit | Attending: Neurosurgery | Admitting: Neurosurgery

## 2023-06-30 ENCOUNTER — Other Ambulatory Visit: Payer: Medicare Other

## 2023-07-04 ENCOUNTER — Other Ambulatory Visit: Payer: Self-pay | Admitting: Family Medicine

## 2023-07-05 ENCOUNTER — Other Ambulatory Visit: Payer: Self-pay

## 2023-07-05 MED ORDER — HYDROXYZINE PAMOATE 25 MG PO CAPS: 25.0000 mg | ORAL_CAPSULE | Freq: Four times a day (QID) | ORAL | 1 refills | Status: DC | PRN

## 2023-07-12 NOTE — Discharge Instructions (Signed)

## 2023-07-13 ENCOUNTER — Encounter: Payer: Self-pay | Admitting: Family Medicine

## 2023-07-13 ENCOUNTER — Ambulatory Visit
Admission: RE | Admit: 2023-07-13 | Discharge: 2023-07-13 | Disposition: A | Discharge status: Home / Self Care | Payer: Medicare Other | Source: Ambulatory Visit | Attending: Neurosurgery | Admitting: Neurosurgery

## 2023-07-13 ENCOUNTER — Ambulatory Visit: Payer: Self-pay | Admitting: Family Medicine

## 2023-07-13 DIAGNOSIS — M4805 Spinal stenosis, thoracolumbar region: Secondary | ICD-10-CM | POA: Diagnosis not present

## 2023-07-13 DIAGNOSIS — M48062 Spinal stenosis, lumbar region with neurogenic claudication: Secondary | ICD-10-CM | POA: Diagnosis not present

## 2023-07-13 DIAGNOSIS — Z981 Arthrodesis status: Secondary | ICD-10-CM | POA: Diagnosis not present

## 2023-07-13 MED ORDER — ONDANSETRON HCL 4 MG/2ML IJ SOLN: 4.0000 mg | Freq: Once | INTRAMUSCULAR | Status: DC | PRN

## 2023-07-13 MED ORDER — IOPAMIDOL (ISOVUE-M 200) INJECTION 41%
20.0000 mL | Freq: Once | INTRAMUSCULAR | Status: AC
  Administered 2023-07-13: 20 mL via INTRATHECAL

## 2023-07-13 MED ORDER — MEPERIDINE HCL 50 MG/ML IJ SOLN: 50.0000 mg | Freq: Once | INTRAMUSCULAR | Status: DC | PRN

## 2023-07-13 MED ORDER — DIAZEPAM 5 MG PO TABS
5.0000 mg | ORAL_TABLET | Freq: Once | ORAL | Status: AC
  Administered 2023-07-13: 5 mg via ORAL

## 2023-07-13 NOTE — Telephone Encounter (Signed)
Per Dr. Sedalia Muta patient needs appt. Appt scheduled.

## 2023-07-13 NOTE — Telephone Encounter (Signed)
Chief Complaint: wheezing Symptoms: wheezing, mild SOB, cough with green mucus  Frequency: started yesterday PM, worse this AM Pertinent Negatives: Patient denies CP, fever, N/V/D Disposition: [] ED /[x] Urgent Care (no appt availability in office) / [] Appointment(In office/virtual)/ []  [facility]/ [] Home Care/ [] Refused Recommended Disposition /[] [facility]/ []  Follow-up with PCP  Additional Notes: Pt reports wheezing (audible on inspiration and expiration) and mild SOB (no SOB at rest) with cough and green sputum. Pt denies chest pain. Pt states symptoms began last night and were worse upon awakening this AM. Pt states she has a hx of bronchiectasis and that she needs antibiotics when these symptoms arise "or else I'll be in the hospital." Pt denies home O2 use. Of note, pt states she is currently at an imaging center having a myelogram performed. While this RN was on the call, a nurse at the imaging center used a pulse ox to check pt's O2 and pulse. Per nurse at imaging, pt's O2 was 98 on room air and pt's HR was 74. Per protocol, advised pt she needed to be seen today. Pt's PCP Dr. Sedalia Muta had no availability today, and neither did the other providers in the office. This RN advised pt be seen at [facility]. Pt did not wish to be seen at a different clinic. Therefore, RN offered to schedule pt an appt at [facility], pt declined and said she would go to [facility] after her imaging is over as a walk-in. This RN advised pt to call back here for any worsening symptoms, pt verbalized understanding.   Copied from CRM [id number]. Topic: Clinical - Red Word Triage >> Jul 13, 2023  2:29 PM Dennison Nancy wrote: Red Word that prompted transfer to Nurse Triage: wheezing  patient already have lung disease Reason for Disposition  [1] MILD difficulty breathing (e.g., minimal/no SOB at rest, SOB with walking, pulse <100) AND [2] NEW-onset or WORSE than normal  Answer Assessment - Initial Assessment Questions 1. RESPIRATORY  STATUS: "Describe your breathing?" (e.g., wheezing, shortness of breath, unable to speak, severe coughing)      Wheezing 2. ONSET: "When did this breathing problem begin?"      Started yesterday evening, worse this AM (coughing up "terrible junk - green") 3. PATTERN "Does the difficult breathing come and go, or has it been constant since it started?"      Constant 4. SEVERITY: "How bad is your breathing?" (e.g., mild, moderate, severe)    - MILD: No SOB at rest, mild SOB with walking, speaks normally in sentences, can lie down, no retractions, pulse < 100.    - MODERATE: SOB at rest, SOB with minimal exertion and prefers to sit, cannot lie down flat, speaks in phrases, mild retractions, audible wheezing, pulse 100-120.    - SEVERE: Very SOB at rest, speaks in single words, struggling to breathe, sitting hunched forward, retractions, pulse > 120      Mild 5. RECURRENT SYMPTOM: "Have you had difficulty breathing before?" If Yes, ask: "When was the last time?" and "What happened that time?"      Hx of bronchiectasis  6. CARDIAC HISTORY: "Do you have any history of heart disease?" (e.g., heart attack, angina, bypass surgery, angioplasty)      No 7. LUNG HISTORY: "Do you have any history of lung disease?"  (e.g., pulmonary embolus, asthma, emphysema)     Yes 8. CAUSE: "What do you think is causing the breathing problem?"      Thinks it's related to cough 9.  OTHER SYMPTOMS: "Do you have any other symptoms? (e.g., dizziness, runny nose, cough, chest pain, fever)     None, just cough 10. O2 SATURATION MONITOR:  "Do you use an oxygen saturation monitor (pulse oximeter) at home?" If Yes, ask: "What is your reading (oxygen level) today?" "What is your usual oxygen saturation reading?" (e.g., 95%)       98 - checked by nurse at imaging (patient at imaging for a myelogram)  Protocols used: Breathing Difficulty-A-AH

## 2023-07-14 ENCOUNTER — Ambulatory Visit (INDEPENDENT_AMBULATORY_CARE_PROVIDER_SITE_OTHER): Payer: Medicare Other | Admitting: Family Medicine

## 2023-07-14 ENCOUNTER — Encounter: Payer: Self-pay | Admitting: Family Medicine

## 2023-07-14 VITALS — BP 104/60 | HR 88 | Temp 97.7°F | Ht 65.0 in | Wt 161.2 lb

## 2023-07-14 DIAGNOSIS — J479 Bronchiectasis, uncomplicated: Secondary | ICD-10-CM

## 2023-07-14 DIAGNOSIS — H66003 Acute suppurative otitis media without spontaneous rupture of ear drum, bilateral: Secondary | ICD-10-CM | POA: Insufficient documentation

## 2023-07-14 MED ORDER — AMOXICILLIN-POT CLAVULANATE 875-125 MG PO TABS: 1.0000 | ORAL_TABLET | Freq: Two times a day (BID) | ORAL | 0 refills | Status: DC

## 2023-07-14 MED ORDER — ALBUTEROL SULFATE HFA 108 (90 BASE) MCG/ACT IN AERS: 2.0000 | INHALATION_SPRAY | Freq: Four times a day (QID) | RESPIRATORY_TRACT | 2 refills | Status: AC | PRN

## 2023-07-14 NOTE — Progress Notes (Signed)
Acute Office Visit  Subjective:    Patient ID: Courtney [name], female    DOB: 09/07/1945, 78 y.o.   MRN: [id number]  Chief Complaint  Patient presents with   Cough    congestion    Discussed the use of AI scribe software for clinical note transcription with the patient, who gave verbal consent to proceed.   HPI: The patient, with a history of bronchiectasis, presents with symptoms of a respiratory infection that started about a week ago. The main symptoms include a cough, pain in the right ear, and pain in the sinus area. The patient also reports a headache. The patient has been managing the symptoms with over-the-counter medications including Mucinex, DayQuil, and Alka-Seltzer. The patient also uses an Albuterol inhaler twice a day for wheezing, which is reported to help with shortness of breath. The patient has a history of multiple hospitalizations for pneumonia, which is said to be a complication of bronchiectasis. The patient also takes a daily antihistamine and a probiotic for kidney health.  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 07/25/2014   Followed in Pulmonary clinic/ [facility]/ [place]    Cardiac dysrhythmia    Chronic idiopathic constipation    Colitis, acute 02/08/2021   Coronary artery disease    Depression, major, recurrent, moderate (HCC)    Headache    Heart murmur    History of bladder infections    History of COVID-19    Hyperlipidemia    Hypertension    Hypothyroidism    Knee pain, bilateral    Melena 02/08/2021   Osteoarthritis    Pneumonia    Primary insomnia    Recovering alcoholic (HCC)    SVT (supraventricular tachycardia) (HCC)    UTI (urinary tract infection)    Varicose veins    Vitamin B12 deficiency     Past Surgical History:  Procedure Laterality Date   ABDOMINAL HYSTERECTOMY  1991   APPENDECTOMY     BACK SURGERY     had  2 surgeries. Have rods placed in back    BREAST BIOPSY Left    x2   BREAST ENHANCEMENT SURGERY  2006   CARDIAC CATHETERIZATION  12/22/2020   stents placed   CHOLECYSTECTOMY  12/2019   removed lap band.    COLONOSCOPY  12/01/2016   Moderate predominantly sigmod diverticulosis. Otherwise grossly normal colonoscopy   EYE SURGERY     IOL- bilateral - [place]   KNEE ARTHROSCOPY Left    lap band surgery  1981   LUMBAR FUSION  07/29/2015   posterior level one   removal of cervical disc fragments  10/2007   pt. denies    TUBAL LIGATION  1970    Family History  Problem Relation Age of Onset   Heart disease Mother    Heart disease Father    Diabetes Sister    Other Sister        BRAIN TUMOR   Heart disease Brother    Heart disease Brother    Heart disease Brother    Heart disease Brother    Heart disease Brother    Colon cancer Maternal Aunt    Colon cancer Cousin     Social History   Socioeconomic History   Marital status: Widowed    Spouse name: Not on file   Number of children: 2   Years of education:  Not on file   Highest education level: Not on file  Occupational History   Occupation: retired    Comment: nurse  Tobacco Use   Smoking status: Never   Smokeless tobacco: Never  Vaping Use   Vaping status: Never Used  Substance and Sexual Activity   Alcohol use: No    Alcohol/week: 0.0 standard drinks of alcohol    Comment: recovery x 20 yrs   Drug use: No   Sexual activity: Not Currently    Comment: MARRIED  Other Topics Concern   Not on file  Social History Narrative   Not on file   Social Drivers of Health   Financial Resource Strain: Low Risk  (12/12/2022)   Overall Financial Resource Strain (CARDIA)    Difficulty of Paying Living Expenses: Not hard at all  Food Insecurity: No Food Insecurity (06/10/2023)   Hunger Vital Sign    Worried About Running Out of Food in the Last Year: Never true    Ran Out of Food in the Last Year: Never true   Transportation Needs: No Transportation Needs (06/10/2023)   PRAPARE - Administrator, Civil Service (Medical): No    Lack of Transportation (Non-Medical): No  Physical Activity: Inactive (12/12/2022)   Exercise Vital Sign    Days of Exercise per Week: 0 days    Minutes of Exercise per Session: 0 min  Stress: No Stress Concern Present (12/12/2022)   [employer] of Occupational Health - Occupational Stress Questionnaire    Feeling of Stress : Not at all  Social Connections: Moderately Isolated (12/12/2022)   Social Connection and Isolation Panel [NHANES]    Frequency of Communication with Friends and Family: Three times a week    Frequency of Social Gatherings with Friends and Family: Three times a week    Attends Religious Services: More than 4 times per year    Active Member of Clubs or Organizations: No    Attends Banker Meetings: Never    Marital Status: Widowed  Intimate Partner Violence: Not At Risk (06/10/2023)   Humiliation, Afraid, Rape, and Kick questionnaire    Fear of Current or Ex-Partner: No    Emotionally Abused: No    Physically Abused: No    Sexually Abused: No    Outpatient Medications Prior to Visit  Medication Sig Dispense Refill   ALPRAZolam (XANAX) 0.5 MG tablet TAKE 1 TABLET(0.5 MG) BY MOUTH TWICE DAILY AS NEEDED FOR ANXIETY 60 tablet 0   amitriptyline (ELAVIL) 10 MG tablet Take 1 tablet (10 mg total) by mouth 2 (two) times daily as needed for sleep. 60 tablet 6   amLODipine (NORVASC) 5 MG tablet Take 1 tablet (5 mg total) by mouth daily. 90 tablet 0   Ascorbic Acid (VITAMIN C) 1000 MG tablet Take 1,000 mg by mouth daily.     Cholecalciferol (D3 PO) Take 2 tablets by mouth daily. chewable     ciprofloxacin (CIPRO) 500 MG tablet Take 500 mg by mouth 2 (two) times daily.     DULoxetine (CYMBALTA) 60 MG capsule Take 60 mg by mouth 2 (two) times daily.     FLUoxetine (PROZAC) 20 MG capsule Take 1 capsule (20 mg total) by mouth daily.  90 capsule 1   furosemide (LASIX) 20 MG tablet Take 20 mg by mouth daily.     furosemide (LASIX) 40 MG tablet Take 1 tablet (40 mg total) by mouth daily. 30 tablet 3   gabapentin (NEURONTIN) 300 MG capsule Take 2  capsules (600 mg total) by mouth 3 (three) times daily. (Patient taking differently: Take 600 mg by mouth 3 (three) times daily as needed (pain).) 180 capsule 11   hydrOXYzine (VISTARIL) 25 MG capsule Take 1 capsule (25 mg total) by mouth every 6 (six) hours as needed for itching. 90 capsule 1   levothyroxine (SYNTHROID) 75 MCG tablet TAKE 1 TABLET(75 MCG) BY MOUTH DAILY 90 tablet 1   lidocaine (LIDODERM) 5 % Place 1 patch onto the skin daily as needed (pain). Remove & Discard patch within 12 hours or as directed by MD     lidocaine-prilocaine (EMLA) cream 1 gram qid prn (Patient taking differently: Apply 1 Application topically 4 (four) times daily as needed (pain).) 30 g 6   lisinopril (ZESTRIL) 2.5 MG tablet Take 2.5 mg by mouth daily.     MAGNESIUM PO Take 1 tablet by mouth daily. chewable     meloxicam (MOBIC) 15 MG tablet Take 15 mg by mouth daily as needed for pain.     metoprolol tartrate (LOPRESSOR) 50 MG tablet TAKE 1 TABLET(50 MG) BY MOUTH TWICE DAILY WITH MEALS 180 tablet 1   montelukast (SINGULAIR) 10 MG tablet Take 10 mg by mouth daily as needed (allergies).     naloxone (NARCAN) nasal spray 4 mg/0.1 mL Place 1 spray into the nose once. In case of opioid overdose.     nitroGLYCERIN (NITROSTAT) 0.4 MG SL tablet DISSOLVE ONE TABLET UNDER TONGUE AS NEEDED FOR CHEST PAIN EVERY 5 MINUTES FOR UP TO 3 DOSES 25 tablet 1   omeprazole (PRILOSEC) 40 MG capsule Take 40 mg by mouth daily as needed (reflux).     ondansetron (ZOFRAN-ODT) 8 MG disintegrating tablet Take 8 mg by mouth every 8 (eight) hours as needed for nausea.     OXcarbazepine (TRILEPTAL) 150 MG tablet Take 1 tablet (150 mg total) by mouth 2 (two) times daily. 60 tablet 11   oxyCODONE-acetaminophen (PERCOCET) 10-325 MG  tablet Take 1 tablet by mouth See admin instructions. Take 1 tablet by mouth 5 times a day. May skip a dose if not needed.     phenazopyridine (PYRIDIUM) 200 MG tablet Take 200 mg by mouth every 8 (eight) hours as needed.     REPATHA SURECLICK 140 MG/ML SOAJ Inject 140 mg into the skin every 14 (fourteen) days.     RESTASIS 0.05 % ophthalmic emulsion Place 1 drop into both eyes 2 (two) times daily as needed (dryness).     tirzepatide (MOUNJARO) 10 MG/0.5ML Pen Inject 10 mg into the skin once a week. 6 mL 0   tirzepatide (MOUNJARO) 12.5 MG/0.5ML Pen Inject 12.5 mg into the skin once a week for 16 doses. 2 mL 3   tiZANidine (ZANAFLEX) 4 MG tablet Take 4 mg by mouth at bedtime as needed for muscle spasms.     tretinoin (RETIN-A) 0.025 % gel Apply 1 Application topically at bedtime.     valACYclovir (VALTREX) 1000 MG tablet Take 1 tablet (1,000 mg total) by mouth daily. 90 tablet 0   No facility-administered medications prior to visit.    Allergies  Allergen Reactions   Levofloxacin Hives, Shortness Of Breath, Swelling and Other (See Comments)    Tolerates Ciprofloxacin RASH IN MOUTH   (can take IV route)   Augmentin [Amoxicillin-Pot Clavulanate] Nausea Only   Bactrim [Sulfamethoxazole-Trimethoprim] Hives and Itching   Clarithromycin     GI upset Other reaction(s): GI Upset (intolerance) GI upset -can take but does cause mild upset  Cymbalta [Duloxetine Hcl]     itching   Hydroxyzine Swelling   Lyrica [Pregabalin]    Nexletol [Bempedoic Acid]     Muscle pain   Statins Other (See Comments)    Myalgias   Zetia [Ezetimibe]     Muscle pain    Review of Systems  Constitutional:  Negative for appetite change, chills, fatigue and fever.  HENT:  Positive for congestion, ear pain (right), sinus pain (frontal) and voice change. Negative for sinus pressure and sore throat.   Respiratory:  Positive for wheezing. Negative for cough, chest tightness and shortness of breath.   Cardiovascular:   Negative for chest pain and palpitations.  Gastrointestinal:  Negative for abdominal pain, constipation, diarrhea, nausea and vomiting.  Genitourinary:  Negative for dysuria, hematuria and urgency.  Musculoskeletal:  Negative for arthralgias, back pain, joint swelling and myalgias.  Skin:  Negative for rash.  Neurological:  Positive for headaches. Negative for dizziness and weakness.  Psychiatric/Behavioral:  Negative for dysphoric mood. The patient is not nervous/anxious.        Objective:        07/14/2023   10:07 AM 07/13/2023    2:23 PM 07/13/2023   12:47 PM  Vitals with BMI  Height 5\' 5"     Weight 161 lbs 3 oz    BMI 26.83    Systolic 104 153 564  Diastolic 60 72 74  Pulse 88 74 71    No data found.   Physical Exam Vitals reviewed.  Constitutional:      General: She is not in acute distress.    Appearance: Normal appearance. She is ill-appearing.  HENT:     Right Ear: Tympanic membrane is erythematous.     Left Ear: Tympanic membrane is erythematous.     Nose:     Right Turbinates: Pale.     Left Turbinates: Pale.     Right Sinus: Frontal sinus tenderness present.     Left Sinus: Frontal sinus tenderness present.     Mouth/Throat:     Pharynx: No posterior oropharyngeal erythema.  Eyes:     Conjunctiva/sclera: Conjunctivae normal.  Neck:     Vascular: No carotid bruit.  Cardiovascular:     Rate and Rhythm: Normal rate and regular rhythm.     Heart sounds: Normal heart sounds.  Pulmonary:     Effort: Pulmonary effort is normal.     Breath sounds: Examination of the right-lower field reveals rales. Examination of the left-lower field reveals rales. Rales present. No wheezing.  Abdominal:     General: Bowel sounds are normal.     Palpations: Abdomen is soft.     Tenderness: There is no abdominal tenderness.  Musculoskeletal:        General: Normal range of motion.  Skin:    General: Skin is warm.  Neurological:     Mental Status: She is alert. Mental  status is at baseline.  Psychiatric:        Mood and Affect: Mood normal.        Behavior: Behavior normal.     Health Maintenance Due  Topic Date Due   Zoster Vaccines- Shingrix (1 of 2) 03/08/1965   Colonoscopy  12/01/2021   Medicare Annual Wellness (AWV)  04/29/2023    There are no preventive care reminders to display for this patient.   Lab Results  Component Value Date   TSH 2.110 03/02/2023   Lab Results  Component Value Date   WBC 6.6 06/11/2023  HGB 11.7 (L) 06/11/2023   HCT 35.2 (L) 06/11/2023   MCV 94.9 06/11/2023   PLT 185 06/11/2023   Lab Results  Component Value Date   NA 132 (L) 06/11/2023   K 3.8 06/11/2023   CO2 26 06/11/2023   GLUCOSE 175 (H) 06/11/2023   BUN 15 06/11/2023   CREATININE 0.97 06/11/2023   BILITOT 0.4 06/11/2023   ALKPHOS 150 (H) 06/11/2023   AST 23 06/11/2023   ALT 63 (H) 06/11/2023   PROT 6.0 (L) 06/11/2023   ALBUMIN 2.4 (L) 06/11/2023   CALCIUM 8.9 06/11/2023   ANIONGAP 11 06/11/2023   EGFR 53 (L) 03/02/2023   Lab Results  Component Value Date   CHOL 210 (H) 12/12/2022   Lab Results  Component Value Date   HDL 56 12/12/2022   Lab Results  Component Value Date   LDLCALC 122 (H) 12/12/2022   Lab Results  Component Value Date   TRIG 181 (H) 12/12/2022   Lab Results  Component Value Date   CHOLHDL 3.8 12/12/2022   Lab Results  Component Value Date   HGBA1C 5.6 12/12/2022       Assessment & Plan:  Bronchiectasis without complication (HCC) Assessment & Plan: Acute Symptoms started three days ago with pain in the right ear and sinus area, headache, and cough. No sore throat, trouble swallowing, or systemic symptoms. Crackles noted in bilateral lower lobes on auscultation. -Start Augmentin by mouth TWICE A DAY for 10 days -Continue current symptomatic treatment with Mucinex, DayQuil, and Alka-Seltzer as needed. -Advise rest, hydration, and use of home remedies such as honey, lemon, and hot tea.  Orders: -      Albuterol Sulfate HFA; Inhale 2 puffs into the lungs every 6 (six) hours as needed for wheezing or shortness of breath.  Dispense: 8 g; Refill: 2 -     Amoxicillin-Pot Clavulanate; Take 1 tablet by mouth 2 (two) times daily.  Dispense: 20 tablet; Refill: 0  Non-recurrent acute suppurative otitis media of both ears without spontaneous rupture of tympanic membranes Assessment & Plan: Acute -Augmentin PO TWICE A DAY for 10 days Educated on possible side effects of medication and that she may try a probiotic for GI symptoms    Orders: -     Amoxicillin-Pot Clavulanate; Take 1 tablet by mouth 2 (two) times daily.  Dispense: 20 tablet; Refill: 0     Meds ordered this encounter  Medications   albuterol (VENTOLIN HFA) 108 (90 Base) MCG/ACT inhaler    Sig: Inhale 2 puffs into the lungs every 6 (six) hours as needed for wheezing or shortness of breath.    Dispense:  8 g    Refill:  2   amoxicillin-clavulanate (AUGMENTIN) 875-125 MG tablet    Sig: Take 1 tablet by mouth 2 (two) times daily.    Dispense:  20 tablet    Refill:  0    No orders of the defined types were placed in this encounter.    Follow-up: Return if symptoms worsen or fail to improve.  An After Visit Summary was printed and given to the patient.  Total time spent on today's visit was 25 minutes, including both face-to-face time and nonface-to-face time personally spent on review of chart (labs and imaging), discussing labs and goals, discussing further work-up, treatment options, referrals to specialist if needed, reviewing outside records if pertinent, answering patient's questions, and coordinating care.    Lajuana Matte, FNP [name] [phone]

## 2023-07-14 NOTE — Assessment & Plan Note (Signed)
Acute -Augmentin PO TWICE A DAY for 10 days Educated on possible side effects of medication and that she may try a probiotic for GI symptoms

## 2023-07-14 NOTE — Assessment & Plan Note (Addendum)
Acute Symptoms started three days ago with pain in the right ear and sinus area, headache, and cough. No sore throat, trouble swallowing, or systemic symptoms. Crackles noted in bilateral lower lobes on auscultation. -Start Augmentin by mouth TWICE A DAY for 10 days -Continue current symptomatic treatment with Mucinex, DayQuil, and Alka-Seltzer as needed. -Advise rest, hydration, and use of home remedies such as honey, lemon, and hot tea.

## 2023-07-17 ENCOUNTER — Other Ambulatory Visit: Payer: Self-pay | Admitting: Family Medicine

## 2023-07-20 ENCOUNTER — Telehealth: Payer: Self-pay

## 2023-07-20 NOTE — Telephone Encounter (Signed)
Copied from CRM [id number]. Topic: Clinical - Medical Advice >> Jul 20, 2023  9:04 AM Geroge Baseman wrote: Reason for CRM: daughter sherry calling [phone], patient possibly had pneumonia, daughter states she sounds very terrible. Wants a call back to see what she should do for her mother. Don't like to talk to to the contact center and refuses nurse triage and wants to speak with someone from the office directly ASAP.

## 2023-07-24 ENCOUNTER — Ambulatory Visit (INDEPENDENT_AMBULATORY_CARE_PROVIDER_SITE_OTHER): Payer: Medicare Other

## 2023-07-24 ENCOUNTER — Ambulatory Visit (INDEPENDENT_AMBULATORY_CARE_PROVIDER_SITE_OTHER)
Admission: RE | Admit: 2023-07-24 | Discharge: 2023-07-24 | Disposition: A | Discharge status: Home / Self Care | Payer: Medicare Other | Source: Ambulatory Visit

## 2023-07-24 ENCOUNTER — Ambulatory Visit: Payer: Self-pay | Admitting: Family Medicine

## 2023-07-24 VITALS — BP 130/88 | HR 90 | Temp 97.8°F | Ht 65.0 in | Wt 165.0 lb

## 2023-07-24 DIAGNOSIS — R053 Chronic cough: Secondary | ICD-10-CM | POA: Diagnosis not present

## 2023-07-24 DIAGNOSIS — R39198 Other difficulties with micturition: Secondary | ICD-10-CM

## 2023-07-24 DIAGNOSIS — J479 Bronchiectasis, uncomplicated: Secondary | ICD-10-CM

## 2023-07-24 DIAGNOSIS — Z981 Arthrodesis status: Secondary | ICD-10-CM | POA: Diagnosis not present

## 2023-07-24 DIAGNOSIS — R059 Cough, unspecified: Secondary | ICD-10-CM

## 2023-07-24 DIAGNOSIS — J984 Other disorders of lung: Secondary | ICD-10-CM | POA: Diagnosis not present

## 2023-07-24 LAB — POCT URINALYSIS DIP (CLINITEK)
Bilirubin, UA: NEGATIVE
Blood, UA: NEGATIVE
Glucose, UA: NEGATIVE mg/dL
Ketones, POC UA: NEGATIVE mg/dL
Leukocytes, UA: NEGATIVE
Nitrite, UA: NEGATIVE
POC PROTEIN,UA: NEGATIVE
Spec Grav, UA: 1.01 (ref 1.010–1.025)
Urobilinogen, UA: 0.2 U/dL
pH, UA: 6.5 (ref 5.0–8.0)

## 2023-07-24 MED ORDER — HYDROCOD POLI-CHLORPHE POLI ER 10-8 MG/5ML PO SUER: 5.0000 mL | Freq: Every evening | ORAL | 0 refills | Status: AC | PRN

## 2023-07-24 NOTE — Patient Instructions (Signed)
VISIT SUMMARY:  During today's visit, we discussed your persistent cough and headache, which have been ongoing despite completing a course of antibiotics. We reviewed your current treatments and symptoms, including your history of bronchiectasis. We also discussed your upcoming appointment with your lung doctor.  YOUR PLAN:  -COUGH AND RESPIRATORY SYMPTOMS: Your persistent cough and respiratory symptoms may be due to a viral infection, a flare-up of your bronchiectasis, or possibly pneumonia. We will order a chest x-ray to check for pneumonia. In the meantime, you will be prescribed cough medicine with codeine to help at night, and you should continue using Mucinex, your inhalers, and steam inhalation. Increasing your water intake and using an extra pillow at night may also help with your symptoms.  -BRONCHIECTASIS: Bronchiectasis is a chronic condition where the airways in your lungs become damaged, leading to increased mucus production and infections. Given your increased cough and sputum production, we will order a chest x-ray to check for any changes or pneumonia. Continue using your inhalers and nebulizer with albuterol, and follow up with your pulmonologist on Friday.  -GENERAL HEALTH MAINTENANCE: We have updated your medical record to reflect your allergies to levofloxacin, Bactrim, Cymbalta, and hydroxyzine. You tolerate Augmentin and azithromycin well.  INSTRUCTIONS:  Please follow up with your pulmonologist on Friday as planned. We will review the results of your chest x-ray and adjust your treatment if necessary.

## 2023-07-24 NOTE — Assessment & Plan Note (Signed)
Persistent cough and respiratory symptoms despite completing a 10-day course of Augmentin. Symptoms include cough, headache, and post-nasal drainage, worse at night when lying down. No fever.   Physical exam reveals crackles in the left lung, most likely consistent with known bronchiectasis.   Differential diagnosis includes community-acquired pneumonia, bronchiectasis flare-up, or viral respiratory infection.   Current treatments include inhalers, nebulizer with albuterol, Robitussin, and Mucinex. Discussed that cough may be viral and often the last symptom to resolve after a respiratory infection, requiring symptomatic treatment.   - [facility] [place] to rule out pneumonia. If she does have a pneumonia, since she already received Augmentin for 10 days, I would add AZITHROMYCIN as she states she does tolerate it.  - Prescribe cough medicine with codeine for nighttime use. Advise to avoid xanax while on the cough syrup - Continue Mucinex, inhalers, and steam inhalation - Encourage increased water intake and use of an extra pillow to alleviate post-nasal drainage - Follow up with pulmonologist on Friday

## 2023-07-24 NOTE — Assessment & Plan Note (Signed)
Chronic condition with left lung involvement. Reports increased cough and sputum production, indicating a possible flare-up. No recent chest x-ray to assess current status. Discussed that if chest x-ray shows pneumonia, additional antibiotics may be needed; otherwise, continue symptomatic treatment. - Order chest x-ray to assess for pneumonia or changes in bronchiectasis - Continue current use of inhalers and nebulizer with albuterol - Follow up with pulmonologist on Friday

## 2023-07-24 NOTE — Telephone Encounter (Signed)
  Chief Complaint: worsening cough Symptoms: cough, wheezing, runny nose Frequency: 1 week Pertinent Negatives: Patient denies SOB, fever Disposition: [] ED /[] Urgent Care (no appt availability in office) / [x] Appointment(In office/virtual)/ []  [place] [facility]/ [] Home Care/ [] Refused Recommended Disposition /[] [facility]/ []  Follow-up with PCP Additional Notes: Patient calls reporting worsening cough x 1 week. States she completed a round of antibiotics with no improvement. Reports home O2 is 93%, states that is normal for her due to chronic lung condition. Per protocol, patient to be evaluated within 4 hours. Scheduled with first available provider in office for today at 1320. Care advice reviewed, patient verbalized understanding. Alerting PCP for review.   Copied from CRM [id number]. Topic: Clinical - Red Word Triage >> Jul 24, 2023  7:47 AM Clayton Bibles wrote: Red Word that prompted transfer to Nurse Triage: Erlene  is worse since her last appointment. Her cough will not go away. She coughed all night last night. Oxygen is 93, no fever, sore in chest Reason for Disposition  Wheezing is present  Answer Assessment - Initial Assessment Questions 1. ONSET: "When did the cough begin?"      1 week- took abx with no improvement 2. SEVERITY: "How bad is the cough today?"      Up all night coughing 3. SPUTUM: "Describe the color of your sputum" (none, dry cough; clear, white, yellow, green)     Green sputum, chunky 4. HEMOPTYSIS: "Are you coughing up any blood?" If so ask: "How much?" (flecks, streaks, tablespoons, etc.)     Denies 5. DIFFICULTY BREATHING: "Are you having difficulty breathing?" If Yes, ask: "How bad is it?" (e.g., mild, moderate, severe)    - MILD: No SOB at rest, mild SOB with walking, speaks normally in sentences, can lie down, no retractions, pulse < 100.    - MODERATE: SOB at rest, SOB with minimal exertion and prefers to sit, cannot lie down flat, speaks in  phrases, mild retractions, audible wheezing, pulse 100-120.    - SEVERE: Very SOB at rest, speaks in single words, struggling to breathe, sitting hunched forward, retractions, pulse > 120      Denies SOB 6. FEVER: "Do you have a fever?" If Yes, ask: "What is your temperature, how was it measured, and when did it start?"     Denies 7. CARDIAC HISTORY: "Do you have any history of heart disease?" (e.g., heart attack, congestive heart failure)      Denies 8. LUNG HISTORY: "Do you have any history of lung disease?"  (e.g., pulmonary embolus, asthma, emphysema)     Bronchiectasis , asthma 9. PE RISK FACTORS: "Do you have a history of blood clots?" (or: recent major surgery, recent prolonged travel, bedridden)     Denies 10. OTHER SYMPTOMS: "Do you have any other symptoms?" (e.g., runny nose, wheezing, chest pain)       Runny nose, wheezing  12. TRAVEL: "Have you traveled out of the country in the last month?" (e.g., travel history, exposures)       Denies  Protocols used: Cough - Acute Productive-A-AH

## 2023-07-24 NOTE — Progress Notes (Signed)
Acute Office Visit  Subjective:    Patient ID: Courtney [name], female    DOB: Mar 21, 1946, 78 y.o.   MRN: [id number]  Chief Complaint  Patient presents with   Cough    Discussed the use of AI scribe software for clinical note transcription with the patient, who gave verbal consent to proceed.      HPI:  Patient is in today for reporting worsening cough for a week. States she completed a round of antibiotics with no improvement. Reports home O2 is 93%, states that is normal for her due to chronic lung condition.  The patient, with bronchiectasis, presents with persistent cough and headache.  The patient has a persistent cough that worsens when lying down at night, accompanied by sputum production described as 'god awful looking stuff.' This has been ongoing since their last visit on Thursday. They have a history of bronchiectasis and are scheduled to see their lung doctor on Friday. They completed a ten-day course of Augmentin but continue to experience symptoms. They have been using inhalers and a nebulizer with Albuterol, as well as over-the-counter medications like Robitussin and Mucinex, although they are currently out of Mucinex. They express a need for cough medicine to help at night.  They experience headaches primarily located in the forehead, which have persisted since their last visit. No fever has been reported in the past week. They mention post-nasal drainage and slight ear discomfort, though it is not severe.  They have known allergies to levofloxacin, Bactrim, Cymbalta, and hydroxyzine, but can tolerate Augmentin. They also take Xanax at bedtime if needed. Their blood pressure was initially high but then normalized. A urine test conducted previously was negative for infection.   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 07/25/2014   Followed in Pulmonary clinic/  [facility]/ [place]    Cardiac dysrhythmia    Chronic idiopathic constipation    Colitis, acute 02/08/2021   Coronary artery disease    Depression, major, recurrent, moderate (HCC)    Headache    Heart murmur    History of bladder infections    History of COVID-19    Hyperlipidemia    Hypertension    Hypothyroidism    Knee pain, bilateral    Melena 02/08/2021   Osteoarthritis    Pneumonia    Primary insomnia    Recovering alcoholic (HCC)    SVT (supraventricular tachycardia) (HCC)    UTI (urinary tract infection)    Varicose veins    Vitamin B12 deficiency     Past Surgical History:  Procedure Laterality Date   ABDOMINAL HYSTERECTOMY  1991   APPENDECTOMY     BACK SURGERY     had 2 surgeries. Have rods placed in back    BREAST BIOPSY Left    x2   BREAST ENHANCEMENT SURGERY  2006   CARDIAC CATHETERIZATION  12/22/2020   stents placed   CHOLECYSTECTOMY  12/2019   removed lap band.    COLONOSCOPY  12/01/2016   Moderate predominantly sigmod diverticulosis. Otherwise grossly normal colonoscopy   EYE SURGERY     IOL- bilateral - [place]   KNEE ARTHROSCOPY Left    lap band surgery  1981   LUMBAR FUSION  07/29/2015   posterior level one   removal of cervical disc fragments  10/2007   pt. denies    TUBAL LIGATION  1970  Family History  Problem Relation Age of Onset   Heart disease Mother    Heart disease Father    Diabetes Sister    Other Sister        BRAIN TUMOR   Heart disease Brother    Heart disease Brother    Heart disease Brother    Heart disease Brother    Heart disease Brother    Colon cancer Maternal Aunt    Colon cancer Cousin     Social History   Socioeconomic History   Marital status: Widowed    Spouse name: Not on file   Number of children: 2   Years of education: Not on file   Highest education level: Not on file  Occupational History   Occupation: retired    Comment: nurse  Tobacco Use   Smoking status: Never   Smokeless  tobacco: Never  Vaping Use   Vaping status: Never Used  Substance and Sexual Activity   Alcohol use: No    Alcohol/week: 0.0 standard drinks of alcohol    Comment: recovery x 20 yrs   Drug use: No   Sexual activity: Not Currently    Comment: MARRIED  Other Topics Concern   Not on file  Social History Narrative   Not on file   Social Drivers of Health   Financial Resource Strain: Low Risk  (12/12/2022)   Overall Financial Resource Strain (CARDIA)    Difficulty of Paying Living Expenses: Not hard at all  Food Insecurity: No Food Insecurity (06/10/2023)   Hunger Vital Sign    Worried About Running Out of Food in the Last Year: Never true    Ran Out of Food in the Last Year: Never true  Transportation Needs: No Transportation Needs (06/10/2023)   PRAPARE - Administrator, Civil Service (Medical): No    Lack of Transportation (Non-Medical): No  Physical Activity: Inactive (12/12/2022)   Exercise Vital Sign    Days of Exercise per Week: 0 days    Minutes of Exercise per Session: 0 min  Stress: No Stress Concern Present (12/12/2022)   [employer] of Occupational Health - Occupational Stress Questionnaire    Feeling of Stress : Not at all  Social Connections: Moderately Isolated (12/12/2022)   Social Connection and Isolation Panel [NHANES]    Frequency of Communication with Friends and Family: Three times a week    Frequency of Social Gatherings with Friends and Family: Three times a week    Attends Religious Services: More than 4 times per year    Active Member of Clubs or Organizations: No    Attends Banker Meetings: Never    Marital Status: Widowed  Intimate Partner Violence: Not At Risk (06/10/2023)   Humiliation, Afraid, Rape, and Kick questionnaire    Fear of Current or Ex-Partner: No    Emotionally Abused: No    Physically Abused: No    Sexually Abused: No    Outpatient Medications Prior to Visit  Medication Sig Dispense Refill    albuterol (VENTOLIN HFA) 108 (90 Base) MCG/ACT inhaler Inhale 2 puffs into the lungs every 6 (six) hours as needed for wheezing or shortness of breath. 8 g 2   ALPRAZolam (XANAX) 0.5 MG tablet TAKE 1 TABLET(0.5 MG) BY MOUTH TWICE DAILY AS NEEDED FOR ANXIETY 60 tablet 0   amitriptyline (ELAVIL) 10 MG tablet Take 1 tablet (10 mg total) by mouth 2 (two) times daily as needed for sleep. 60 tablet 6   amLODipine (  NORVASC) 5 MG tablet Take 1 tablet (5 mg total) by mouth daily. 90 tablet 0   amoxicillin-clavulanate (AUGMENTIN) 875-125 MG tablet Take 1 tablet by mouth 2 (two) times daily. 20 tablet 0   Ascorbic Acid (VITAMIN C) 1000 MG tablet Take 1,000 mg by mouth daily.     Cholecalciferol (D3 PO) Take 2 tablets by mouth daily. chewable     ciprofloxacin (CIPRO) 500 MG tablet Take 500 mg by mouth 2 (two) times daily.     DULoxetine (CYMBALTA) 60 MG capsule Take 60 mg by mouth 2 (two) times daily.     FLUoxetine (PROZAC) 20 MG capsule Take 1 capsule (20 mg total) by mouth daily. 90 capsule 1   furosemide (LASIX) 20 MG tablet Take 20 mg by mouth daily.     furosemide (LASIX) 40 MG tablet Take 1 tablet (40 mg total) by mouth daily. 30 tablet 3   gabapentin (NEURONTIN) 300 MG capsule Take 2 capsules (600 mg total) by mouth 3 (three) times daily. (Patient taking differently: Take 600 mg by mouth 3 (three) times daily as needed (pain).) 180 capsule 11   hydrOXYzine (VISTARIL) 25 MG capsule Take 1 capsule (25 mg total) by mouth every 6 (six) hours as needed for itching. 90 capsule 1   levothyroxine (SYNTHROID) 75 MCG tablet TAKE 1 TABLET(75 MCG) BY MOUTH DAILY 90 tablet 1   lidocaine (LIDODERM) 5 % Place 1 patch onto the skin daily as needed (pain). Remove & Discard patch within 12 hours or as directed by MD     lidocaine-prilocaine (EMLA) cream 1 gram qid prn (Patient taking differently: Apply 1 Application topically 4 (four) times daily as needed (pain).) 30 g 6   lisinopril (ZESTRIL) 2.5 MG tablet Take 2.5 mg  by mouth daily.     MAGNESIUM PO Take 1 tablet by mouth daily. chewable     meloxicam (MOBIC) 15 MG tablet Take 15 mg by mouth daily as needed for pain.     metoprolol tartrate (LOPRESSOR) 50 MG tablet TAKE 1 TABLET(50 MG) BY MOUTH TWICE DAILY WITH MEALS 180 tablet 1   montelukast (SINGULAIR) 10 MG tablet Take 10 mg by mouth daily as needed (allergies).     naloxone (NARCAN) nasal spray 4 mg/0.1 mL Place 1 spray into the nose once. In case of opioid overdose.     nitroGLYCERIN (NITROSTAT) 0.4 MG SL tablet DISSOLVE ONE TABLET UNDER TONGUE AS NEEDED FOR CHEST PAIN EVERY 5 MINUTES FOR UP TO 3 DOSES 25 tablet 1   omeprazole (PRILOSEC) 40 MG capsule Take 40 mg by mouth daily as needed (reflux).     ondansetron (ZOFRAN-ODT) 8 MG disintegrating tablet Take 8 mg by mouth every 8 (eight) hours as needed for nausea.     OXcarbazepine (TRILEPTAL) 150 MG tablet Take 1 tablet (150 mg total) by mouth 2 (two) times daily. 60 tablet 11   oxyCODONE-acetaminophen (PERCOCET) 10-325 MG tablet Take 1 tablet by mouth See admin instructions. Take 1 tablet by mouth 5 times a day. May skip a dose if not needed.     phenazopyridine (PYRIDIUM) 200 MG tablet Take 200 mg by mouth every 8 (eight) hours as needed.     REPATHA SURECLICK 140 MG/ML SOAJ Inject 140 mg into the skin every 14 (fourteen) days.     RESTASIS 0.05 % ophthalmic emulsion Place 1 drop into both eyes 2 (two) times daily as needed (dryness).     tirzepatide (MOUNJARO) 10 MG/0.5ML Pen Inject 10 mg into the skin  once a week. 6 mL 0   tirzepatide (MOUNJARO) 12.5 MG/0.5ML Pen Inject 12.5 mg into the skin once a week for 16 doses. 2 mL 3   tiZANidine (ZANAFLEX) 4 MG tablet Take 4 mg by mouth at bedtime as needed for muscle spasms.     tretinoin (RETIN-A) 0.025 % gel Apply 1 Application topically at bedtime.     valACYclovir (VALTREX) 1000 MG tablet Take 1 tablet (1,000 mg total) by mouth daily. 90 tablet 0   No facility-administered medications prior to visit.     Allergies  Allergen Reactions   Levofloxacin Hives, Shortness Of Breath, Swelling and Other (See Comments)    Tolerates Ciprofloxacin RASH IN MOUTH   (can take IV route)   Augmentin [Amoxicillin-Pot Clavulanate] Nausea Only   Bactrim [Sulfamethoxazole-Trimethoprim] Hives and Itching   Clarithromycin     GI upset Other reaction(s): GI Upset (intolerance) GI upset -can take but does cause mild upset    Cymbalta [Duloxetine Hcl]     itching   Hydroxyzine Swelling   Lyrica [Pregabalin]    Nexletol [Bempedoic Acid]     Muscle pain   Statins Other (See Comments)    Myalgias   Zetia [Ezetimibe]     Muscle pain    Review of Systems  Constitutional:  Negative for chills, fatigue and fever.  HENT:  Positive for postnasal drip. Negative for congestion, ear pain and sinus pain.   Respiratory:  Positive for cough. Negative for shortness of breath.   Cardiovascular:  Negative for chest pain.  Gastrointestinal:  Negative for abdominal pain, constipation, diarrhea, nausea and vomiting.  Musculoskeletal:  Negative for myalgias.  Neurological:  Positive for headaches.       Objective:        07/24/2023    1:07 PM 07/14/2023   10:07 AM 07/13/2023    2:23 PM  Vitals with BMI  Height 5\' 5"  5\' 5"    Weight 165 lbs 161 lbs 3 oz   BMI 27.46 26.83   Systolic 130 104 045  Diastolic 88 60 72  Pulse 90 88 74    No data found.   Physical Exam Vitals and nursing note reviewed.  HENT:     Head: Normocephalic and atraumatic.     Right Ear: Tympanic membrane normal.     Left Ear: Tympanic membrane normal.     Nose: Nose normal.     Mouth/Throat:     Comments: Post nasal drainage Eyes:     Pupils: Pupils are equal, round, and reactive to light.  Cardiovascular:     Rate and Rhythm: Normal rate and regular rhythm.  Pulmonary:     Effort: Pulmonary effort is normal.     Breath sounds: Rales (crackles left lung) present.  Musculoskeletal:        General: Normal range of motion.      Cervical back: Normal range of motion.  Skin:    General: Skin is warm.  Neurological:     General: No focal deficit present.     Mental Status: She is alert.  Psychiatric:        Mood and Affect: Mood normal.     Health Maintenance Due  Topic Date Due   Zoster Vaccines- Shingrix (1 of 2) 03/08/1965   Colonoscopy  12/01/2021   Medicare Annual Wellness (AWV)  04/29/2023    There are no preventive care reminders to display for this patient.   Lab Results  Component Value Date   TSH 2.110 03/02/2023  Lab Results  Component Value Date   WBC 6.6 06/11/2023   HGB 11.7 (L) 06/11/2023   HCT 35.2 (L) 06/11/2023   MCV 94.9 06/11/2023   PLT 185 06/11/2023   Lab Results  Component Value Date   NA 132 (L) 06/11/2023   K 3.8 06/11/2023   CO2 26 06/11/2023   GLUCOSE 175 (H) 06/11/2023   BUN 15 06/11/2023   CREATININE 0.97 06/11/2023   BILITOT 0.4 06/11/2023   ALKPHOS 150 (H) 06/11/2023   AST 23 06/11/2023   ALT 63 (H) 06/11/2023   PROT 6.0 (L) 06/11/2023   ALBUMIN 2.4 (L) 06/11/2023   CALCIUM 8.9 06/11/2023   ANIONGAP 11 06/11/2023   EGFR 53 (L) 03/02/2023   Lab Results  Component Value Date   CHOL 210 (H) 12/12/2022   Lab Results  Component Value Date   HDL 56 12/12/2022   Lab Results  Component Value Date   LDLCALC 122 (H) 12/12/2022   Lab Results  Component Value Date   TRIG 181 (H) 12/12/2022   Lab Results  Component Value Date   CHOLHDL 3.8 12/12/2022   Lab Results  Component Value Date   HGBA1C 5.6 12/12/2022       Assessment & Plan:  Difficulty urinating -     POCT URINALYSIS DIP (CLINITEK)  Bronchiectasis without complication (HCC) Assessment & Plan: Chronic condition with left lung involvement. Reports increased cough and sputum production, indicating a possible flare-up. No recent chest x-ray to assess current status. Discussed that if chest x-ray shows pneumonia, additional antibiotics may be needed; otherwise, continue symptomatic  treatment. - Order chest x-ray to assess for pneumonia or changes in bronchiectasis - Continue current use of inhalers and nebulizer with albuterol - Follow up with pulmonologist on Friday  Orders: -     DG Chest 2 View; Future  Persistent cough Assessment & Plan: Persistent cough and respiratory symptoms despite completing a 10-day course of Augmentin. Symptoms include cough, headache, and post-nasal drainage, worse at night when lying down. No fever.   Physical exam reveals crackles in the left lung, most likely consistent with known bronchiectasis.   Differential diagnosis includes community-acquired pneumonia, bronchiectasis flare-up, or viral respiratory infection.   Current treatments include inhalers, nebulizer with albuterol, Robitussin, and Mucinex. Discussed that cough may be viral and often the last symptom to resolve after a respiratory infection, requiring symptomatic treatment.   - [facility] [place] to rule out pneumonia. If she does have a pneumonia, since she already received Augmentin for 10 days, I would add AZITHROMYCIN as she states she does tolerate it.  - Prescribe cough medicine with codeine for nighttime use. Advise to avoid xanax while on the cough syrup - Continue Mucinex, inhalers, and steam inhalation - Encourage increased water intake and use of an extra pillow to alleviate post-nasal drainage - Follow up with pulmonologist on Friday  Orders: -     DG Chest 2 View; Future  Other orders -     Hydrocod Poli-Chlorphe Poli ER; Take 5 mLs by mouth at bedtime as needed for up to 7 days for cough.  Dispense: 35 mL; Refill: 0     Meds ordered this encounter  Medications   chlorpheniramine-HYDROcodone (TUSSIONEX) 10-8 MG/5ML    Sig: Take 5 mLs by mouth at bedtime as needed for up to 7 days for cough.    Dispense:  35 mL    Refill:  0    Orders Placed This Encounter  Procedures   DG Chest 2 View   POCT URINALYSIS DIP (CLINITEK)      Follow-up: No follow-ups on file.  An After Visit Summary was printed and given to the patient.  Windell Moment, MD [name] [phone]

## 2023-07-28 ENCOUNTER — Ambulatory Visit (INDEPENDENT_AMBULATORY_CARE_PROVIDER_SITE_OTHER): Payer: Medicare Other

## 2023-07-28 ENCOUNTER — Inpatient Hospital Stay: Payer: Medicare Other | Admitting: Adult Health

## 2023-07-28 VITALS — BP 80/50 | HR 93 | Temp 97.4°F | Resp 14 | Ht 65.0 in | Wt 159.0 lb

## 2023-07-28 DIAGNOSIS — B0229 Other postherpetic nervous system involvement: Secondary | ICD-10-CM | POA: Diagnosis not present

## 2023-07-28 DIAGNOSIS — R197 Diarrhea, unspecified: Secondary | ICD-10-CM | POA: Diagnosis not present

## 2023-07-28 DIAGNOSIS — J479 Bronchiectasis, uncomplicated: Secondary | ICD-10-CM | POA: Diagnosis not present

## 2023-07-28 LAB — POCT INFLUENZA A/B
Influenza A, POC: NEGATIVE
Influenza B, POC: NEGATIVE

## 2023-07-28 MED ORDER — TRELEGY ELLIPTA 100-62.5-25 MCG/ACT IN AEPB: 1.0000 | INHALATION_SPRAY | Freq: Every day | RESPIRATORY_TRACT | 11 refills | Status: DC

## 2023-07-28 MED ORDER — COLLAGENASE 250 UNIT/GM EX OINT: 1.0000 | TOPICAL_OINTMENT | Freq: Every day | CUTANEOUS | 1 refills | Status: DC

## 2023-07-28 NOTE — Progress Notes (Signed)
 Acute Office Visit  Subjective:    Patient ID: Courtney [name], female    DOB: 09-09-1945, 78 y.o.   MRN: [id number]  Chief Complaint  Patient presents with   Diarrhea   Nasal Congestion    Discussed the use of AI scribe software for clinical note transcription with the patient, who gave verbal consent to proceed.      HPI: Patient is in today for runny diarrhea for 3 days. She feels some nauseas. She denies abdominal pain or blood in the stool. She took imodium and it did not help her.  She also mentioned headache, nasal congestion. She has been with this congestion for 3 weeks. She took amoxicillin  for 7 days and she finished 5 days ago. She did not have diarrhea when she took this medicine. She requested to be check for flu.  Courtney [name] is a 78 year old female with bronchiectasis who presents with persistent cough and diarrhea.  She has been experiencing a persistent cough since her last visit on February 3rd, despite completing a course of Augmentin  prescribed for sinusitis and bronchiectasis. She uses cough medicine with codeine at night, Mucinex , inhalers, steam, and an extra pillow to alleviate symptoms. No new pneumonia was noted on a recent chest x-ray.  She has had diarrhea for the past three days, which could be attributed to the Augmentin , although she completed the antibiotic course prior to the onset of diarrhea. She has been taking Imodium to manage the diarrhea and is also using probiotics and eating yogurt to help restore gut bacteria. She is negative for influenza A and B.  She has a history of shingles since September of the previous year, with persistent lesions on her left forehead, scalp, and left forearm. The lesions are described as itchy, and she has been applying Silvadene cream, although she is unsure of its effectiveness. The lesions have not healed and continue to cause discomfort.  She has chronic lung changes and uses a rescue inhaler, albuterol , but  does not currently use a maintenance inhaler. She reports feeling generally unwell since having shingles and mentions the loss of her husband in May, which has contributed to her overall feeling of being unwell. She reports feeling weak and having difficulty with daily activities.  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 07/25/2014   Followed in Pulmonary clinic/ [facility]/ [place]    Cardiac dysrhythmia    Chronic idiopathic constipation    Colitis, acute 02/08/2021   Coronary artery disease    Depression, major, recurrent, moderate (HCC)    Headache    Heart murmur    History of bladder infections    History of COVID-19    Hyperlipidemia    Hypertension    Hypothyroidism    Knee pain, bilateral    Melena 02/08/2021   Osteoarthritis    Pneumonia    Primary insomnia    Recovering alcoholic (HCC)    SVT (supraventricular tachycardia) (HCC)    UTI (urinary tract infection)    Varicose veins    Vitamin B12 deficiency     Past Surgical History:  Procedure Laterality Date   ABDOMINAL HYSTERECTOMY  1991   APPENDECTOMY     BACK SURGERY     had 2 surgeries. Have rods placed in back    BREAST BIOPSY Left    x2   BREAST ENHANCEMENT  SURGERY  2006   CARDIAC CATHETERIZATION  12/22/2020   stents placed   CHOLECYSTECTOMY  12/2019   removed lap band.    COLONOSCOPY  12/01/2016   Moderate predominantly sigmod diverticulosis. Otherwise grossly normal colonoscopy   EYE SURGERY     IOL- bilateral - [place]   KNEE ARTHROSCOPY Left    lap band surgery  1981   LUMBAR FUSION  07/29/2015   posterior level one   removal of cervical disc fragments  10/2007   pt. denies    TUBAL LIGATION  1970    Family History  Problem Relation Age of Onset   Heart disease Mother    Heart disease Father    Diabetes Sister    Other Sister        BRAIN TUMOR   Heart disease Brother     Heart disease Brother    Heart disease Brother    Heart disease Brother    Heart disease Brother    Colon cancer Maternal Aunt    Colon cancer Cousin     Social History   Socioeconomic History   Marital status: Widowed    Spouse name: Not on file   Number of children: 2   Years of education: Not on file   Highest education level: Not on file  Occupational History   Occupation: retired    Comment: nurse  Tobacco Use   Smoking status: Never   Smokeless tobacco: Never  Vaping Use   Vaping status: Never Used  Substance and Sexual Activity   Alcohol use: No    Alcohol/week: 0.0 standard drinks of alcohol    Comment: recovery x 20 yrs   Drug use: No   Sexual activity: Not Currently    Comment: MARRIED  Other Topics Concern   Not on file  Social History Narrative   Not on file   Social Drivers of Health   Financial Resource Strain: Low Risk  (12/12/2022)   Overall Financial Resource Strain (CARDIA)    Difficulty of Paying Living Expenses: Not hard at all  Food Insecurity: No Food Insecurity (06/10/2023)   Hunger Vital Sign    Worried About Running Out of Food in the Last Year: Never true    Ran Out of Food in the Last Year: Never true  Transportation Needs: No Transportation Needs (06/10/2023)   PRAPARE - Administrator, Civil Service (Medical): No    Lack of Transportation (Non-Medical): No  Physical Activity: Inactive (12/12/2022)   Exercise Vital Sign    Days of Exercise per Week: 0 days    Minutes of Exercise per Session: 0 min  Stress: No Stress Concern Present (12/12/2022)   [employer] of Occupational Health - Occupational Stress Questionnaire    Feeling of Stress : Not at all  Social Connections: Moderately Isolated (12/12/2022)   Social Connection and Isolation Panel [NHANES]    Frequency of Communication with Friends and Family: Three times a week    Frequency of Social Gatherings with Friends and Family: Three times a week    Attends  Religious Services: More than 4 times per year    Active Member of Clubs or Organizations: No    Attends Banker Meetings: Never    Marital Status: Widowed  Intimate Partner Violence: Not At Risk (06/10/2023)   Humiliation, Afraid, Rape, and Kick questionnaire    Fear of Current or Ex-Partner: No    Emotionally Abused: No    Physically Abused: No  Sexually Abused: No    Outpatient Medications Prior to Visit  Medication Sig Dispense Refill   albuterol  (VENTOLIN  HFA) 108 (90 Base) MCG/ACT inhaler Inhale 2 puffs into the lungs every 6 (six) hours as needed for wheezing or shortness of breath. 8 g 2   ALPRAZolam  (XANAX ) 0.5 MG tablet TAKE 1 TABLET(0.5 MG) BY MOUTH TWICE DAILY AS NEEDED FOR ANXIETY 60 tablet 0   amitriptyline  (ELAVIL ) 10 MG tablet Take 1 tablet (10 mg total) by mouth 2 (two) times daily as needed for sleep. 60 tablet 6   amLODipine  (NORVASC ) 5 MG tablet Take 1 tablet (5 mg total) by mouth daily. 90 tablet 0   amoxicillin -clavulanate (AUGMENTIN ) 875-125 MG tablet Take 1 tablet by mouth 2 (two) times daily. 20 tablet 0   Ascorbic Acid (VITAMIN C) 1000 MG tablet Take 1,000 mg by mouth daily.     chlorpheniramine-HYDROcodone  (TUSSIONEX) 10-8 MG/5ML Take 5 mLs by mouth at bedtime as needed for up to 7 days for cough. 35 mL 0   Cholecalciferol (D3 PO) Take 2 tablets by mouth daily. chewable     ciprofloxacin  (CIPRO ) 500 MG tablet Take 500 mg by mouth 2 (two) times daily.     DULoxetine  (CYMBALTA ) 60 MG capsule Take 60 mg by mouth 2 (two) times daily.     FLUoxetine  (PROZAC ) 20 MG capsule Take 1 capsule (20 mg total) by mouth daily. 90 capsule 1   furosemide  (LASIX ) 20 MG tablet Take 20 mg by mouth daily.     furosemide  (LASIX ) 40 MG tablet Take 1 tablet (40 mg total) by mouth daily. 30 tablet 3   gabapentin  (NEURONTIN ) 300 MG capsule Take 2 capsules (600 mg total) by mouth 3 (three) times daily. (Patient taking differently: Take 600 mg by mouth 3 (three) times daily as  needed (pain).) 180 capsule 11   hydrOXYzine  (VISTARIL ) 25 MG capsule Take 1 capsule (25 mg total) by mouth every 6 (six) hours as needed for itching. 90 capsule 1   levothyroxine  (SYNTHROID ) 75 MCG tablet TAKE 1 TABLET(75 MCG) BY MOUTH DAILY 90 tablet 1   lidocaine  (LIDODERM ) 5 % Place 1 patch onto the skin daily as needed (pain). Remove & Discard patch within 12 hours or as directed by MD     lidocaine -prilocaine  (EMLA ) cream 1 gram qid prn (Patient taking differently: Apply 1 Application topically 4 (four) times daily as needed (pain).) 30 g 6   lisinopril  (ZESTRIL ) 2.5 MG tablet Take 2.5 mg by mouth daily.     MAGNESIUM PO Take 1 tablet by mouth daily. chewable     meloxicam (MOBIC) 15 MG tablet Take 15 mg by mouth daily as needed for pain.     metoprolol  tartrate (LOPRESSOR ) 50 MG tablet TAKE 1 TABLET(50 MG) BY MOUTH TWICE DAILY WITH MEALS 180 tablet 1   montelukast  (SINGULAIR ) 10 MG tablet Take 10 mg by mouth daily as needed (allergies).     naloxone (NARCAN) nasal spray 4 mg/0.1 mL Place 1 spray into the nose once. In case of opioid overdose.     nitroGLYCERIN  (NITROSTAT ) 0.4 MG SL tablet DISSOLVE ONE TABLET UNDER TONGUE AS NEEDED FOR CHEST PAIN EVERY 5 MINUTES FOR UP TO 3 DOSES 25 tablet 1   omeprazole  (PRILOSEC) 40 MG capsule Take 40 mg by mouth daily as needed (reflux).     ondansetron  (ZOFRAN -ODT) 8 MG disintegrating tablet Take 8 mg by mouth every 8 (eight) hours as needed for nausea.     OXcarbazepine  (TRILEPTAL ) 150 MG  tablet Take 1 tablet (150 mg total) by mouth 2 (two) times daily. 60 tablet 11   oxyCODONE -acetaminophen  (PERCOCET) 10-325 MG tablet Take 1 tablet by mouth See admin instructions. Take 1 tablet by mouth 5 times a day. May skip a dose if not needed.     phenazopyridine  (PYRIDIUM ) 200 MG tablet Take 200 mg by mouth every 8 (eight) hours as needed.     REPATHA SURECLICK 140 MG/ML SOAJ Inject 140 mg into the skin every 14 (fourteen) days.     RESTASIS 0.05 % ophthalmic  emulsion Place 1 drop into both eyes 2 (two) times daily as needed (dryness).     tirzepatide  (MOUNJARO ) 10 MG/0.5ML Pen Inject 10 mg into the skin once a week. 6 mL 0   tirzepatide  (MOUNJARO ) 12.5 MG/0.5ML Pen Inject 12.5 mg into the skin once a week for 16 doses. 2 mL 3   tiZANidine  (ZANAFLEX ) 4 MG tablet Take 4 mg by mouth at bedtime as needed for muscle spasms.     tretinoin  (RETIN-A ) 0.025 % gel Apply 1 Application topically at bedtime.     valACYclovir  (VALTREX ) 1000 MG tablet Take 1 tablet (1,000 mg total) by mouth daily. 90 tablet 0   No facility-administered medications prior to visit.    Allergies  Allergen Reactions   Levofloxacin Hives, Shortness Of Breath, Swelling and Other (See Comments)    Tolerates Ciprofloxacin  RASH IN MOUTH   (can take IV route)   Augmentin  [Amoxicillin -Pot Clavulanate] Nausea Only   Bactrim  [Sulfamethoxazole -Trimethoprim ] Hives and Itching   Clarithromycin      GI upset Other reaction(s): GI Upset (intolerance) GI upset -can take but does cause mild upset    Cymbalta  [Duloxetine  Hcl]     itching   Hydroxyzine  Swelling   Lyrica [Pregabalin]    Nexletol [Bempedoic Acid]     Muscle pain   Statins Other (See Comments)    Myalgias   Zetia  [Ezetimibe ]     Muscle pain    Review of Systems  Constitutional:  Positive for fatigue. Negative for chills and fever.  HENT:  Positive for congestion. Negative for ear pain and sore throat.   Respiratory:  Positive for cough. Negative for shortness of breath.   Cardiovascular:  Negative for chest pain and palpitations.  Gastrointestinal:  Positive for diarrhea and nausea. Negative for abdominal pain, constipation and vomiting.  Endocrine: Negative for polydipsia, polyphagia and polyuria.  Genitourinary:  Negative for difficulty urinating and dysuria.  Musculoskeletal:  Negative for arthralgias, back pain and myalgias.  Skin:  Negative for rash.  Neurological:  Positive for headaches.   Psychiatric/Behavioral:  Negative for dysphoric mood. The patient is not nervous/anxious.        Objective:        07/28/2023   10:00 AM 07/24/2023    1:07 PM 07/14/2023   10:07 AM  Vitals with BMI  Height 5' 5 5' 5 5' 5  Weight 159 lbs 165 lbs 161 lbs 3 oz  BMI 26.46 27.46 26.83  Systolic 80 130 104  Diastolic 50 88 60  Pulse 93 90 88    No data found.   Physical Exam Vitals and nursing note reviewed.  Constitutional:      Appearance: Normal appearance.     Comments: : Lesions present on the left forehead and scalp, described as itchy.- open wounds 3 measuring less than 1 cm noted on left forehead and left eyebrow.   Another slightly erythematous scaly lesion measuring 4-5 mm on the right side of  the nose- nasal ala.  Nasal mucosa appears normal.  HENT:     Head: Normocephalic and atraumatic.  Cardiovascular:     Rate and Rhythm: Normal rate and regular rhythm.  Pulmonary:     Breath sounds: Rales (chronic finding) present.  Musculoskeletal:        General: Normal range of motion.  Neurological:     Mental Status: She is alert.     Health Maintenance Due  Topic Date Due   Zoster Vaccines- Shingrix (1 of 2) 03/08/1965   Colonoscopy  12/01/2021   Medicare Annual Wellness (AWV)  04/29/2023    There are no preventive care reminders to display for this patient.   Lab Results  Component Value Date   TSH 2.110 03/02/2023   Lab Results  Component Value Date   WBC 6.6 06/11/2023   HGB 11.7 (L) 06/11/2023   HCT 35.2 (L) 06/11/2023   MCV 94.9 06/11/2023   PLT 185 06/11/2023   Lab Results  Component Value Date   NA 132 (L) 06/11/2023   K 3.8 06/11/2023   CO2 26 06/11/2023   GLUCOSE 175 (H) 06/11/2023   BUN 15 06/11/2023   CREATININE 0.97 06/11/2023   BILITOT 0.4 06/11/2023   ALKPHOS 150 (H) 06/11/2023   AST 23 06/11/2023   ALT 63 (H) 06/11/2023   PROT 6.0 (L) 06/11/2023   ALBUMIN 2.4 (L) 06/11/2023   CALCIUM 8.9 06/11/2023   ANIONGAP 11 06/11/2023    EGFR 53 (L) 03/02/2023   Lab Results  Component Value Date   CHOL 210 (H) 12/12/2022   Lab Results  Component Value Date   HDL 56 12/12/2022   Lab Results  Component Value Date   LDLCALC 122 (H) 12/12/2022   Lab Results  Component Value Date   TRIG 181 (H) 12/12/2022   Lab Results  Component Value Date   CHOLHDL 3.8 12/12/2022   Lab Results  Component Value Date   HGBA1C 5.6 12/12/2022       Assessment & Plan:  Acute diarrhea Assessment & Plan: Diarrhea for three days, likely secondary to recent antibiotic use (Augmentin ). Negative for influenza A and B. Patient has been taking Imodium and probiotics. Discussed that antibiotics can disrupt gut microbiota, causing diarrhea even after cessation. - Continue probiotics - Monitor symptoms and seek emergency care if condition worsens over the weekend  Orders: -     POCT Influenza A/B  Bronchiectasis without complication [facility]) Assessment & Plan: Persistent cough since January 24th, initially treated with Augmentin  for suspected sinusitis and bronchiectasis. Chest x-ray on February 3rd was negative for pneumonia. Continued symptoms despite treatment.   Discussed potential benefits of Trelegy Ellipta , including its long-acting steroid, bronchodilator, and secretion drying properties. Explained it is one puff daily and may significantly improve symptoms if covered by insurance. - Continue Mucinex  - Continue inhalers - Continue steam inhalation - Use an extra pillow - Prescribe Trelegy Ellipta  if covered by insurance - Continue albuterol  inhaler - Follow up with pulmonologist on March 23rd    Post herpetic neuralgia Assessment & Plan: Persistent lesions and pain since shingles in September, located on the left forehead, scalp, and left forearm. Lesions are not healing well. Discussed the use of collagenase  ointment and potential need for cryotherapy if no improvement in 2-3 months. - Prescribe collagenase  ointment if  covered by insurance - Consider alternative treatments if not covered - Monitor lesions and return if no improvement in 2-3 months for potential cryotherapy  General Health Maintenance Last blood  work in December showed elevated liver enzymes. No immediate need for repeat blood work. - Repeat blood work when patient feels better  Follow-up - Follow up with pulmonologist on March 23rd - Seek emergency care if symptoms worsen over the weekend.   Other orders -     Trelegy Ellipta ; Inhale 1 puff into the lungs daily.  Dispense: 1 each; Refill: 11 -     Collagenase ; Apply 1 Application topically daily.  Dispense: 15 g; Refill: 1     Meds ordered this encounter  Medications   Fluticasone -Umeclidin-Vilant (TRELEGY ELLIPTA ) 100-62.5-25 MCG/ACT AEPB    Sig: Inhale 1 puff into the lungs daily.    Dispense:  1 each    Refill:  11   collagenase  (SANTYL ) 250 UNIT/GM ointment    Sig: Apply 1 Application topically daily.    Dispense:  15 g    Refill:  1    Orders Placed This Encounter  Procedures   Influenza A/B     Follow-up: No follow-ups on file.  An After Visit Summary was printed and given to the patient.  Witt Plitt, MD [name] [phone]

## 2023-07-28 NOTE — Assessment & Plan Note (Signed)
 Diarrhea for three days, likely secondary to recent antibiotic use (Augmentin ). Negative for influenza A and B. Patient has been taking Imodium and probiotics. Discussed that antibiotics can disrupt gut microbiota, causing diarrhea even after cessation. - Continue probiotics - Monitor symptoms and seek emergency care if condition worsens over the weekend

## 2023-07-28 NOTE — Assessment & Plan Note (Signed)
 Persistent cough since January 24th, initially treated with Augmentin  for suspected sinusitis and bronchiectasis. Chest x-ray on February 3rd was negative for pneumonia. Continued symptoms despite treatment.   Discussed potential benefits of Trelegy Ellipta , including its long-acting steroid, bronchodilator, and secretion drying properties. Explained it is one puff daily and may significantly improve symptoms if covered by insurance. - Continue Mucinex  - Continue inhalers - Continue steam inhalation - Use an extra pillow - Prescribe Trelegy Ellipta  if covered by insurance - Continue albuterol  inhaler - Follow up with pulmonologist on March 23rd

## 2023-07-28 NOTE — Patient Instructions (Signed)
 VISIT SUMMARY:  During today's visit, we discussed your persistent cough, recent diarrhea, and ongoing issues with shingles. We reviewed your current treatments and made some adjustments to help manage your symptoms more effectively.  YOUR PLAN:  -CHRONIC COUGH: Your persistent cough has continued despite previous treatments. We discussed starting Trelegy Ellipta , which is a medication that combines a long-acting steroid, bronchodilator, and secretion drying properties. This may help improve your symptoms if it is covered by your insurance. Continue using Mucinex , your inhalers, steam inhalation, and an extra pillow at night. Also, continue using your albuterol  inhaler as needed. Follow up with your pulmonologist on March 23rd.  -ACUTE DIARRHEA: Your diarrhea is likely due to the recent antibiotic use, which can disrupt the natural bacteria in your gut. Continue taking probiotics and monitor your symptoms. If your condition worsens over the weekend, seek emergency care.  -POSTHERPETIC NEURALGIA: You have persistent lesions and pain from shingles that have not healed well. We discussed using collagenase  ointment to help with healing, and if there is no improvement in 2-3 months, we may consider cryotherapy. Monitor the lesions and return if there is no improvement.  -GENERAL HEALTH MAINTENANCE: Your last blood work in December showed elevated liver enzymes, but there is no immediate need for repeat blood work. We will repeat the blood work when you are feeling better.  INSTRUCTIONS:  Follow up with your pulmonologist on March 23rd. Seek emergency care if your symptoms worsen over the weekend.

## 2023-07-28 NOTE — Assessment & Plan Note (Signed)
 Persistent lesions and pain since shingles in September, located on the left forehead, scalp, and left forearm. Lesions are not healing well. Discussed the use of collagenase  ointment and potential need for cryotherapy if no improvement in 2-3 months. - Prescribe collagenase  ointment if covered by insurance - Consider alternative treatments if not covered - Monitor lesions and return if no improvement in 2-3 months for potential cryotherapy  General Health Maintenance Last blood work in December showed elevated liver enzymes. No immediate need for repeat blood work. - Repeat blood work when patient feels better  Follow-up - Follow up with pulmonologist on March 23rd - Seek emergency care if symptoms worsen over the weekend.

## 2023-08-01 ENCOUNTER — Telehealth: Payer: Self-pay

## 2023-08-01 NOTE — Telephone Encounter (Signed)
Patient was approved for collagenase (SANTYL) ointment today 08/01/2023.

## 2023-08-02 DIAGNOSIS — G894 Chronic pain syndrome: Secondary | ICD-10-CM | POA: Diagnosis not present

## 2023-08-02 DIAGNOSIS — M48062 Spinal stenosis, lumbar region with neurogenic claudication: Secondary | ICD-10-CM | POA: Diagnosis not present

## 2023-08-02 DIAGNOSIS — M961 Postlaminectomy syndrome, not elsewhere classified: Secondary | ICD-10-CM | POA: Diagnosis not present

## 2023-08-03 MED ORDER — DOXYCYCLINE HYCLATE 100 MG PO TABS: 100.0000 mg | ORAL_TABLET | Freq: Two times a day (BID) | ORAL | 0 refills | Status: AC

## 2023-08-07 ENCOUNTER — Other Ambulatory Visit: Payer: Self-pay | Admitting: Family Medicine

## 2023-08-07 ENCOUNTER — Telehealth: Payer: Self-pay

## 2023-08-07 ENCOUNTER — Encounter: Payer: Self-pay | Admitting: Family Medicine

## 2023-08-07 DIAGNOSIS — L905 Scar conditions and fibrosis of skin: Secondary | ICD-10-CM

## 2023-08-07 NOTE — Telephone Encounter (Signed)
Patient asked if she can be referred to Dermatologist for shingles lesions ( scars) on her face. She would like to go to [place]. Please advice.

## 2023-08-12 ENCOUNTER — Other Ambulatory Visit: Payer: Self-pay | Admitting: Family Medicine

## 2023-08-12 DIAGNOSIS — R7303 Prediabetes: Secondary | ICD-10-CM

## 2023-08-14 ENCOUNTER — Telehealth: Payer: Self-pay | Admitting: Family Medicine

## 2023-08-14 NOTE — Telephone Encounter (Signed)
 Patient states that pharmacy called her yesterday 2/23 stating they needed clarification on Santyl mixture ordered 2/11.     Copied from CRM [id number]. Topic: Clinical - Prescription Issue >> Aug 14, 2023 10:34 AM Dennison Nancy wrote: Reason for CRM: recieved call yesterday [facility] at [phone] want more information on the medication  the doctor mamatha sirivol the order the medication its to be mixed how much need more information  Patient need soon as possible  [facility] to call  [phone]

## 2023-08-22 DIAGNOSIS — M47816 Spondylosis without myelopathy or radiculopathy, lumbar region: Secondary | ICD-10-CM | POA: Diagnosis not present

## 2023-08-22 DIAGNOSIS — M5416 Radiculopathy, lumbar region: Secondary | ICD-10-CM | POA: Diagnosis not present

## 2023-08-22 DIAGNOSIS — Z9889 Other specified postprocedural states: Secondary | ICD-10-CM | POA: Diagnosis not present

## 2023-08-29 ENCOUNTER — Ambulatory Visit (INDEPENDENT_AMBULATORY_CARE_PROVIDER_SITE_OTHER)

## 2023-08-29 VITALS — BP 116/82 | HR 72 | Temp 97.5°F | Ht 65.0 in | Wt 159.4 lb

## 2023-08-29 DIAGNOSIS — R300 Dysuria: Secondary | ICD-10-CM | POA: Diagnosis not present

## 2023-08-29 DIAGNOSIS — M48062 Spinal stenosis, lumbar region with neurogenic claudication: Secondary | ICD-10-CM

## 2023-08-29 DIAGNOSIS — E782 Mixed hyperlipidemia: Secondary | ICD-10-CM

## 2023-08-29 DIAGNOSIS — F330 Major depressive disorder, recurrent, mild: Secondary | ICD-10-CM | POA: Diagnosis not present

## 2023-08-29 DIAGNOSIS — R790 Abnormal level of blood mineral: Secondary | ICD-10-CM | POA: Diagnosis not present

## 2023-08-29 DIAGNOSIS — R5383 Other fatigue: Secondary | ICD-10-CM | POA: Diagnosis not present

## 2023-08-29 LAB — POCT URINALYSIS DIP (CLINITEK)
Bilirubin, UA: NEGATIVE
Blood, UA: NEGATIVE
Glucose, UA: NEGATIVE mg/dL
Ketones, POC UA: NEGATIVE mg/dL
Nitrite, UA: NEGATIVE
POC PROTEIN,UA: NEGATIVE
Spec Grav, UA: 1.01 (ref 1.010–1.025)
Urobilinogen, UA: 0.2 U/dL
pH, UA: 6.5 (ref 5.0–8.0)

## 2023-08-29 MED ORDER — DULOXETINE HCL 60 MG PO CPEP: 60.0000 mg | ORAL_CAPSULE | Freq: Two times a day (BID) | ORAL | 0 refills | Status: DC

## 2023-08-29 NOTE — Patient Instructions (Signed)
 VISIT SUMMARY:  During today's visit, we discussed your ongoing fatigue and depression, which you attribute to the anniversary of your husband's passing and other family issues. We also addressed your concerns about back pain, potential anemia, and low B12 levels. We reviewed your current medications and made some adjustments to better manage your symptoms.  YOUR PLAN:  -DEPRESSION: Depression is a mood disorder characterized by persistent feelings of sadness and loss of interest. We have decided to increase your Cymbalta dosage to 60 mg twice daily to help improve your mood and energy levels. Your prescription has been refilled for 180 tablets for 90 days.  -FATIGUE: Fatigue is a feeling of constant tiredness or weakness and can be a symptom of various conditions, including low B12 levels or anemia. We will conduct blood tests to check your B12 levels, complete blood count (CBC), metabolic profile, lipid panel, iron, total iron-binding capacity (TIBC), and ferritin levels. If your B12 levels are low, we will consider B12 injections.  -BACK PAIN: Back pain can be caused by various factors, including chronic conditions or infections like a urinary tract infection (UTI). We will perform a urine test to check for a UTI and refer you to a spine specialist at [facility] for further evaluation of your chronic back issues.  -GENERAL HEALTH MAINTENANCE: Maintaining general health is important for overall well-being. Please continue taking your over-the-counter vitamin D supplements as they support bone health and immune function.  INSTRUCTIONS:  Please follow up with the lab tests as ordered, and we will review the results to determine the next steps. Additionally, we will arrange a referral to a spine specialist at [facility] for your back pain. Continue taking your vitamin D supplements and the adjusted dose of Cymbalta. If you experience any new symptoms or have concerns, please contact our office.

## 2023-08-29 NOTE — Progress Notes (Unsigned)
 Acute Office Visit  Subjective:    Patient ID: Courtney [name], female    DOB: May 13, 1946, 78 y.o.   MRN: [id number]  No chief complaint on file.   Discussed the use of AI scribe software for clinical note transcription with the patient, who gave verbal consent to proceed.       HPI: The patient presents with fatigue and depression.  She experiences fatigue and depression, attributing her lack of energy and difficulty getting out of bed to depression. This time of year is particularly challenging due to the anniversary of her husband's death in 2023-11-28, which she finds emotionally taxing. She used to take long trips with him, which she misses. She is currently taking Cymbalta 30 mg twice daily for depression but feels it is not as effective as it used to be. Initially, it provided relief, but now it seems less effective. She is not taking Prozac, which was previously prescribed. She experiences itching, which she attributes to Cymbalta, but does not consider it significant.  She has a history of shingles and notes that recovery can take a year or two. She has been taking B vitamins, including B12, but questions if her over-the-counter supplements are strong enough. She used to take B12 shots and recalls her B12 level was 11 at the last check, though it has not been tested recently. She wants to have her B12 levels checked and possibly receive a B12 shot.  She has a history of anemia and mentions that her anemia used to drop suddenly without reason. She is concerned about her kidney function and requests lab work to check her kidneys, B12, and other parameters.  She reports severe back pain and cannot distinguish if it is due to a urinary tract infection or her chronic back issues. She has had multiple back surgeries and is considering seeing a new specialist for her back problems.  She mentions feeling depressed due to family issues, including her grandson's wife leaving him for another woman,  which has been emotionally taxing. She helps care for her great-grandchildren, who stay with her part-time.  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 07/25/2014   Followed in Pulmonary clinic/ [facility]/ [place]    Cardiac dysrhythmia    Chronic idiopathic constipation    Colitis, acute 02/08/2021   Coronary artery disease    Depression, major, recurrent, moderate (HCC)    Headache    Heart murmur    History of bladder infections    History of COVID-19    Hyperlipidemia    Hypertension    Hypothyroidism    Knee pain, bilateral    Melena 02/08/2021   Osteoarthritis    Pneumonia    Primary insomnia    Recovering alcoholic (HCC)    SVT (supraventricular tachycardia) (HCC)    UTI (urinary tract infection)    Varicose veins    Vitamin B12 deficiency     Past Surgical History:  Procedure Laterality Date   ABDOMINAL HYSTERECTOMY  1991   APPENDECTOMY     BACK SURGERY     had 2 surgeries. Have rods placed in back    BREAST BIOPSY Left    x2   BREAST ENHANCEMENT SURGERY  2006   CARDIAC CATHETERIZATION  12/22/2020   stents placed   CHOLECYSTECTOMY  12/2019   removed lap band.    COLONOSCOPY  12/01/2016  Moderate predominantly sigmod diverticulosis. Otherwise grossly normal colonoscopy   EYE SURGERY     IOL- bilateral - [place]   KNEE ARTHROSCOPY Left    lap band surgery  1981   LUMBAR FUSION  07/29/2015   posterior level one   removal of cervical disc fragments  10/2007   pt. denies    TUBAL LIGATION  1970    Family History  Problem Relation Age of Onset   Heart disease Mother    Heart disease Father    Diabetes Sister    Other Sister        BRAIN TUMOR   Heart disease Brother    Heart disease Brother    Heart disease Brother    Heart disease Brother    Heart disease Brother    Colon cancer Maternal Aunt    Colon cancer Cousin     Social  History   Socioeconomic History   Marital status: Widowed    Spouse name: Not on file   Number of children: 2   Years of education: Not on file   Highest education level: Not on file  Occupational History   Occupation: retired    Comment: nurse  Tobacco Use   Smoking status: Never   Smokeless tobacco: Never  Vaping Use   Vaping status: Never Used  Substance and Sexual Activity   Alcohol use: No    Alcohol/week: 0.0 standard drinks of alcohol    Comment: recovery x 20 yrs   Drug use: No   Sexual activity: Not Currently    Comment: MARRIED  Other Topics Concern   Not on file  Social History Narrative   Not on file   Social Drivers of Health   Financial Resource Strain: Low Risk  (12/12/2022)   Overall Financial Resource Strain (CARDIA)    Difficulty of Paying Living Expenses: Not hard at all  Food Insecurity: No Food Insecurity (06/10/2023)   Hunger Vital Sign    Worried About Running Out of Food in the Last Year: Never true    Ran Out of Food in the Last Year: Never true  Transportation Needs: No Transportation Needs (06/10/2023)   PRAPARE - Administrator, Civil Service (Medical): No    Lack of Transportation (Non-Medical): No  Physical Activity: Inactive (12/12/2022)   Exercise Vital Sign    Days of Exercise per Week: 0 days    Minutes of Exercise per Session: 0 min  Stress: No Stress Concern Present (12/12/2022)   [employer] of Occupational Health - Occupational Stress Questionnaire    Feeling of Stress : Not at all  Social Connections: Moderately Isolated (12/12/2022)   Social Connection and Isolation Panel [NHANES]    Frequency of Communication with Friends and Family: Three times a week    Frequency of Social Gatherings with Friends and Family: Three times a week    Attends Religious Services: More than 4 times per year    Active Member of Clubs or Organizations: No    Attends Banker Meetings: Never    Marital Status: Widowed   Intimate Partner Violence: Not At Risk (06/10/2023)   Humiliation, Afraid, Rape, and Kick questionnaire    Fear of Current or Ex-Partner: No    Emotionally Abused: No    Physically Abused: No    Sexually Abused: No    Outpatient Medications Prior to Visit  Medication Sig Dispense Refill   albuterol (VENTOLIN HFA) 108 (90 Base) MCG/ACT inhaler Inhale 2 puffs into the  lungs every 6 (six) hours as needed for wheezing or shortness of breath. 8 g 2   ALPRAZolam (XANAX) 0.5 MG tablet TAKE 1 TABLET(0.5 MG) BY MOUTH TWICE DAILY AS NEEDED FOR ANXIETY 60 tablet 0   amitriptyline (ELAVIL) 10 MG tablet Take 1 tablet (10 mg total) by mouth 2 (two) times daily as needed for sleep. 60 tablet 6   amLODipine (NORVASC) 5 MG tablet Take 1 tablet (5 mg total) by mouth daily. 90 tablet 0   Ascorbic Acid (VITAMIN C) 1000 MG tablet Take 1,000 mg by mouth daily.     Cholecalciferol (D3 PO) Take 2 tablets by mouth daily. chewable     ciprofloxacin (CIPRO) 500 MG tablet Take 500 mg by mouth 2 (two) times daily.     collagenase (SANTYL) 250 UNIT/GM ointment Apply 1 Application topically daily. 15 g 1   Fluticasone-Umeclidin-Vilant (TRELEGY ELLIPTA) 100-62.5-25 MCG/ACT AEPB Inhale 1 puff into the lungs daily. 1 each 11   furosemide (LASIX) 20 MG tablet Take 20 mg by mouth daily.     furosemide (LASIX) 40 MG tablet Take 1 tablet (40 mg total) by mouth daily. 30 tablet 3   gabapentin (NEURONTIN) 300 MG capsule Take 2 capsules (600 mg total) by mouth 3 (three) times daily. (Patient taking differently: Take 600 mg by mouth 3 (three) times daily as needed (pain).) 180 capsule 11   hydrOXYzine (VISTARIL) 25 MG capsule Take 1 capsule (25 mg total) by mouth every 6 (six) hours as needed for itching. 90 capsule 1   levothyroxine (SYNTHROID) 75 MCG tablet TAKE 1 TABLET(75 MCG) BY MOUTH DAILY 90 tablet 1   lidocaine (LIDODERM) 5 % Place 1 patch onto the skin daily as needed (pain). Remove & Discard patch within 12 hours or as  directed by MD     lidocaine-prilocaine (EMLA) cream 1 gram qid prn (Patient taking differently: Apply 1 Application topically 4 (four) times daily as needed (pain).) 30 g 6   lisinopril (ZESTRIL) 2.5 MG tablet Take 2.5 mg by mouth daily.     MAGNESIUM PO Take 1 tablet by mouth daily. chewable     meloxicam (MOBIC) 15 MG tablet Take 15 mg by mouth daily as needed for pain.     metoprolol tartrate (LOPRESSOR) 50 MG tablet TAKE 1 TABLET(50 MG) BY MOUTH TWICE DAILY WITH MEALS 180 tablet 1   montelukast (SINGULAIR) 10 MG tablet Take 10 mg by mouth daily as needed (allergies).     MOUNJARO 12.5 MG/0.5ML Pen ADMINISTER 12.5 MG UNDER THE SKIN 1 TIME A WEEK FOR 16 DOSES 2 mL 3   naloxone (NARCAN) nasal spray 4 mg/0.1 mL Place 1 spray into the nose once. In case of opioid overdose.     nitroGLYCERIN (NITROSTAT) 0.4 MG SL tablet DISSOLVE ONE TABLET UNDER TONGUE AS NEEDED FOR CHEST PAIN EVERY 5 MINUTES FOR UP TO 3 DOSES 25 tablet 1   omeprazole (PRILOSEC) 40 MG capsule Take 40 mg by mouth daily as needed (reflux).     ondansetron (ZOFRAN-ODT) 8 MG disintegrating tablet Take 8 mg by mouth every 8 (eight) hours as needed for nausea.     OXcarbazepine (TRILEPTAL) 150 MG tablet Take 1 tablet (150 mg total) by mouth 2 (two) times daily. 60 tablet 11   oxyCODONE-acetaminophen (PERCOCET) 10-325 MG tablet Take 1 tablet by mouth See admin instructions. Take 1 tablet by mouth 5 times a day. May skip a dose if not needed.     phenazopyridine (PYRIDIUM) 200 MG tablet Take  200 mg by mouth every 8 (eight) hours as needed.     REPATHA SURECLICK 140 MG/ML SOAJ Inject 140 mg into the skin every 14 (fourteen) days.     RESTASIS 0.05 % ophthalmic emulsion Place 1 drop into both eyes 2 (two) times daily as needed (dryness).     tirzepatide (MOUNJARO) 10 MG/0.5ML Pen Inject 10 mg into the skin once a week. 6 mL 0   tiZANidine (ZANAFLEX) 4 MG tablet Take 4 mg by mouth at bedtime as needed for muscle spasms.     tretinoin  (RETIN-A) 0.025 % gel Apply 1 Application topically at bedtime.     valACYclovir (VALTREX) 1000 MG tablet Take 1 tablet (1,000 mg total) by mouth daily. 90 tablet 0   DULoxetine (CYMBALTA) 60 MG capsule Take 60 mg by mouth 2 (two) times daily.     FLUoxetine (PROZAC) 20 MG capsule Take 1 capsule (20 mg total) by mouth daily. 90 capsule 1   No facility-administered medications prior to visit.    Allergies  Allergen Reactions   Levofloxacin Hives, Shortness Of Breath, Swelling and Other (See Comments)    Tolerates Ciprofloxacin RASH IN MOUTH   (can take IV route)   Augmentin [Amoxicillin-Pot Clavulanate] Nausea Only   Bactrim [Sulfamethoxazole-Trimethoprim] Hives and Itching   Clarithromycin     GI upset Other reaction(s): GI Upset (intolerance) GI upset -can take but does cause mild upset    Hydroxyzine Swelling   Lyrica [Pregabalin]    Nexletol [Bempedoic Acid]     Muscle pain   Statins Other (See Comments)    Myalgias   Zetia [Ezetimibe]     Muscle pain    Review of Systems  Constitutional:  Positive for fatigue. Negative for chills and fever.  HENT:  Negative for congestion, ear pain, sinus pressure and sore throat.   Respiratory:  Negative for cough.   Cardiovascular:  Negative for chest pain.  Gastrointestinal:  Positive for nausea. Negative for abdominal pain, constipation, diarrhea and vomiting.  Genitourinary:  Negative for dysuria and frequency.  Musculoskeletal:  Positive for back pain. Negative for arthralgias and myalgias.  Neurological:  Negative for dizziness and headaches.  Psychiatric/Behavioral:  Negative for dysphoric mood. The patient is not nervous/anxious.        Objective:        08/29/2023    2:51 PM 07/28/2023   10:00 AM 07/24/2023    1:07 PM  Vitals with BMI  Height 5\' 5"  5\' 5"  5\' 5"   Weight 159 lbs 6 oz 159 lbs 165 lbs  BMI 26.53 26.46 27.46  Systolic 116 80 130  Diastolic 82 50 88  Pulse 72 93 90    No data found.   Physical  Exam Vitals reviewed.  Constitutional:      Appearance: Normal appearance.  HENT:     Head: Normocephalic and atraumatic.  Cardiovascular:     Rate and Rhythm: Normal rate and regular rhythm.  Pulmonary:     Effort: Pulmonary effort is normal.     Breath sounds: Normal breath sounds.  Musculoskeletal:        General: Normal range of motion.     Cervical back: Normal range of motion.  Neurological:     General: No focal deficit present.     Mental Status: She is alert.  Psychiatric:        Mood and Affect: Mood normal.     Health Maintenance Due  Topic Date Due   Zoster Vaccines- Shingrix (1 of  2) 03/08/1965   Colonoscopy  12/01/2021   Medicare Annual Wellness (AWV)  04/29/2023    There are no preventive care reminders to display for this patient.   Lab Results  Component Value Date   TSH 2.110 03/02/2023   Lab Results  Component Value Date   WBC 8.3 08/29/2023   HGB 11.9 08/29/2023   HCT 35.8 08/29/2023   MCV 95 08/29/2023   PLT 283 08/29/2023   Lab Results  Component Value Date   NA 132 (L) 08/29/2023   K 4.7 08/29/2023   CO2 24 08/29/2023   GLUCOSE 52 (L) 08/29/2023   BUN 21 08/29/2023   CREATININE 1.20 (H) 08/29/2023   BILITOT <0.2 08/29/2023   ALKPHOS 154 (H) 08/29/2023   AST 20 08/29/2023   ALT 13 08/29/2023   PROT 7.1 08/29/2023   ALBUMIN 4.2 08/29/2023   CALCIUM 9.5 08/29/2023   ANIONGAP 11 06/11/2023   EGFR 47 (L) 08/29/2023   Lab Results  Component Value Date   CHOL 202 (H) 08/29/2023   Lab Results  Component Value Date   HDL 48 08/29/2023   Lab Results  Component Value Date   LDLCALC 119 (H) 08/29/2023   Lab Results  Component Value Date   TRIG 202 (H) 08/29/2023   Lab Results  Component Value Date   CHOLHDL 4.2 08/29/2023   Lab Results  Component Value Date   HGBA1C 5.6 12/12/2022       Assessment & Plan:  Other fatigue Assessment & Plan: She reports significant fatigue, possibly related to low B12 levels or anemia.  Previous lab work indicated anemia, and B12 levels have not been checked recently. She expressed interest in B12 injections if levels are low. Blood work will be conducted to evaluate B12 levels and other potential causes of fatigue. - Order B12 lab test - Order CBC, metabolic profile, lipid panel, iron, TIBC, and ferritin tests  Orders: -     B12 and Folate Panel -     CBC with Differential/Platelet -     Comprehensive metabolic panel  Dysuria -     POCT URINALYSIS DIP (CLINITEK) -     Comprehensive metabolic panel -     Urine Culture  Spinal stenosis of lumbar region with neurogenic claudication Assessment & Plan: She experiences severe back pain, which may be related to a UTI or her chronic back issues. She has had multiple back surgeries and is considering a referral to a new specialist for further evaluation. She expressed interest in seeing a specialist at [facility] for further evaluation. - Order urine test to evaluate for UTI which only showed few leukocytes, and hence sent it for culture. Will only treat if culture shows bacteria.  - Refer to a spine specialist at [facility] for further evaluation  Orders: -     Ambulatory referral to Neurosurgery  Mild recurrent major depression [facility]) Assessment & Plan: She reports feeling depressed and lacking energy, particularly after the loss of her husband. Cymbalta 30 mg twice daily initially helped but is now less effective. Increasing the dose to 60 mg twice daily is expected to provide better relief from depression and associated pain. She agreed to this adjustment. - Increase Cymbalta to 60 mg twice daily - Refill prescription for 180 tablets for 90 days   Mixed hyperlipidemia -     Lipid panel  Low ferritin -     Iron, TIBC and Ferritin Panel  Other orders -     DULoxetine HCl; Take 1  capsule (60 mg total) by mouth 2 (two) times daily.  Dispense: 180 capsule; Refill: 0     Assessment and Plan       Meds ordered this  encounter  Medications   DULoxetine (CYMBALTA) 60 MG capsule    Sig: Take 1 capsule (60 mg total) by mouth 2 (two) times daily.    Dispense:  180 capsule    Refill:  0    Please dispense cymbalta 60 mg twice daily    Orders Placed This Encounter  Procedures   Urine Culture   B12 and Folate Panel   CBC with Differential/Platelet   Comprehensive metabolic panel   Lipid panel   Iron, TIBC and Ferritin Panel   Ambulatory referral to Neurosurgery   POCT URINALYSIS DIP (CLINITEK)     Follow-up: Return in about 3 months (around 11/29/2023) for chronic disease follow up.  An After Visit Summary was printed and given to the patient.  Windell Moment, MD [name] [phone]

## 2023-08-30 LAB — CBC WITH DIFFERENTIAL/PLATELET
Basophils Absolute: 0.1 10*3/uL (ref 0.0–0.2)
Basos: 1 %
EOS (ABSOLUTE): 1.1 10*3/uL — ABNORMAL HIGH (ref 0.0–0.4)
Eos: 13 %
Hematocrit: 35.8 % (ref 34.0–46.6)
Hemoglobin: 11.9 g/dL (ref 11.1–15.9)
Immature Grans (Abs): 0 10*3/uL (ref 0.0–0.1)
Immature Granulocytes: 0 %
Lymphocytes Absolute: 2.8 10*3/uL (ref 0.7–3.1)
Lymphs: 34 %
MCH: 31.5 pg (ref 26.6–33.0)
MCHC: 33.2 g/dL (ref 31.5–35.7)
MCV: 95 fL (ref 79–97)
Monocytes Absolute: 0.7 10*3/uL (ref 0.1–0.9)
Monocytes: 8 %
Neutrophils Absolute: 3.6 10*3/uL (ref 1.4–7.0)
Neutrophils: 44 %
Platelets: 283 10*3/uL (ref 150–450)
RBC: 3.78 x10E6/uL (ref 3.77–5.28)
RDW: 12.5 % (ref 11.7–15.4)
WBC: 8.3 10*3/uL (ref 3.4–10.8)

## 2023-08-30 LAB — LIPID PANEL
Chol/HDL Ratio: 4.2 ratio (ref 0.0–4.4)
Cholesterol, Total: 202 mg/dL — ABNORMAL HIGH (ref 100–199)
HDL: 48 mg/dL (ref >39)
LDL Chol Calc (NIH): 119 mg/dL — ABNORMAL HIGH (ref 0–99)
Triglycerides: 202 mg/dL — ABNORMAL HIGH (ref 0–149)
VLDL Cholesterol Cal: 35 mg/dL (ref 5–40)

## 2023-08-30 LAB — COMPREHENSIVE METABOLIC PANEL
ALT: 13 IU/L (ref 0–32)
AST: 20 IU/L (ref 0–40)
Albumin: 4.2 g/dL (ref 3.8–4.8)
Alkaline Phosphatase: 154 IU/L — ABNORMAL HIGH (ref 44–121)
BUN/Creatinine Ratio: 18 (ref 12–28)
BUN: 21 mg/dL (ref 8–27)
Bilirubin Total: <0.2 mg/dL (ref 0.0–1.2)
CO2: 24 mmol/L (ref 20–29)
Calcium: 9.5 mg/dL (ref 8.7–10.3)
Chloride: 94 mmol/L — ABNORMAL LOW (ref 96–106)
Creatinine, Ser: 1.2 mg/dL — ABNORMAL HIGH (ref 0.57–1.00)
Globulin, Total: 2.9 g/dL (ref 1.5–4.5)
Glucose: 52 mg/dL — ABNORMAL LOW (ref 70–99)
Potassium: 4.7 mmol/L (ref 3.5–5.2)
Sodium: 132 mmol/L — ABNORMAL LOW (ref 134–144)
Total Protein: 7.1 g/dL (ref 6.0–8.5)
eGFR: 47 mL/min/{1.73_m2} — ABNORMAL LOW (ref >59)

## 2023-08-30 LAB — IRON,TIBC AND FERRITIN PANEL
Ferritin: 52 ng/mL (ref 15–150)
Iron Saturation: 20 % (ref 15–55)
Iron: 59 ug/dL (ref 27–139)
Total Iron Binding Capacity: 298 ug/dL (ref 250–450)
UIBC: 239 ug/dL (ref 118–369)

## 2023-08-30 LAB — B12 AND FOLATE PANEL
Folate: >20 ng/mL (ref >3.0)
Vitamin B-12: >2000 pg/mL — ABNORMAL HIGH (ref 232–1245)

## 2023-08-30 NOTE — Assessment & Plan Note (Signed)
 She reports feeling depressed and lacking energy, particularly after the loss of her husband. Cymbalta 30 mg twice daily initially helped but is now less effective. Increasing the dose to 60 mg twice daily is expected to provide better relief from depression and associated pain. She agreed to this adjustment. - Increase Cymbalta to 60 mg twice daily - Refill prescription for 180 tablets for 90 days

## 2023-08-30 NOTE — Assessment & Plan Note (Signed)
 She reports significant fatigue, possibly related to low B12 levels or anemia. Previous lab work indicated anemia, and B12 levels have not been checked recently. She expressed interest in B12 injections if levels are low. Blood work will be conducted to evaluate B12 levels and other potential causes of fatigue. - Order B12 lab test - Order CBC, metabolic profile, lipid panel, iron, TIBC, and ferritin tests

## 2023-08-30 NOTE — Assessment & Plan Note (Signed)
 She experiences severe back pain, which may be related to a UTI or her chronic back issues. She has had multiple back surgeries and is considering a referral to a new specialist for further evaluation. She expressed interest in seeing a specialist at [facility] for further evaluation. - Order urine test to evaluate for UTI which only showed few leukocytes, and hence sent it for culture. Will only treat if culture shows bacteria.  - Refer to a spine specialist at [facility] for further evaluation

## 2023-08-31 ENCOUNTER — Other Ambulatory Visit: Payer: Self-pay | Admitting: Family Medicine

## 2023-08-31 ENCOUNTER — Other Ambulatory Visit: Payer: Self-pay

## 2023-08-31 ENCOUNTER — Telehealth: Payer: Self-pay

## 2023-08-31 DIAGNOSIS — M5416 Radiculopathy, lumbar region: Secondary | ICD-10-CM | POA: Diagnosis not present

## 2023-08-31 DIAGNOSIS — M961 Postlaminectomy syndrome, not elsewhere classified: Secondary | ICD-10-CM | POA: Diagnosis not present

## 2023-08-31 MED ORDER — CIPROFLOXACIN HCL 500 MG PO TABS: 500.0000 mg | ORAL_TABLET | Freq: Two times a day (BID) | ORAL | 0 refills | Status: AC

## 2023-08-31 MED ORDER — DOXYCYCLINE HYCLATE 100 MG PO TABS: 100.0000 mg | ORAL_TABLET | Freq: Two times a day (BID) | ORAL | 0 refills | Status: DC

## 2023-08-31 NOTE — Telephone Encounter (Signed)
 Patient called in with concerns for the antibiotic that was sent in for her UTI. Patient stated that doxycycline will not help that she has these UTI's frequently and knows that she needs something "powerul". Patient also stated that she in extreme pain. She is requesting Cipro.

## 2023-09-01 LAB — URINE CULTURE

## 2023-09-08 ENCOUNTER — Encounter: Payer: Self-pay | Admitting: Adult Health

## 2023-09-08 ENCOUNTER — Ambulatory Visit: Payer: Medicare Other | Admitting: Adult Health

## 2023-09-08 VITALS — BP 120/80 | HR 65 | Ht 65.0 in | Wt 157.8 lb

## 2023-09-08 DIAGNOSIS — M5459 Other low back pain: Secondary | ICD-10-CM | POA: Diagnosis not present

## 2023-09-08 DIAGNOSIS — J479 Bronchiectasis, uncomplicated: Secondary | ICD-10-CM

## 2023-09-08 DIAGNOSIS — J8489 Other specified interstitial pulmonary diseases: Secondary | ICD-10-CM

## 2023-09-08 DIAGNOSIS — K219 Gastro-esophageal reflux disease without esophagitis: Secondary | ICD-10-CM | POA: Diagnosis not present

## 2023-09-08 NOTE — Progress Notes (Signed)
 @Patient  ID: Courtney [name], female    DOB: August 09, 1945, 78 y.o.   MRN: [id number]  Chief Complaint  Patient presents with   Follow-up   Discussed the use of AI scribe software for clinical note transcription with the patient, who gave verbal consent to proceed.   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  Has a down comforter which she got rid off on 09/13/2019 ILD exposure questionnaire 10/14/2019 significant for nitrofurantoin in 2020 and down comforter.  Stopped exposure to down in 2021 Smoking history: Never smoker Travel history: Likes to travel around the country in an RV Relevant family history: Brother and sister have asthma.  03/21/2018 FVC 2.53 [90%), FEV1 2.29 [99%), F/F 90, TLC 85%, DLCO 87%   02/11/2021 FVC 2.46 [90%], FEV1 2.14 [104%], F/F 87, TLC 4.0 [1%], DLCO 14.87 [80%] Normal test     FENO 03/20/2018-24   Labs: CBC from primary care 03/05/2018-WBC 7, granulocytes 67%, lymphocytes 26%. CBC 03/20/1989-WBC 8, eos 4.7%, absolute eosinophil count 376   Bronchiectasis work up 04/10/2018 Sputum culture -negative for mycobacteria, regular cultures, fungus cultures IgG, IgA, IgM-normal Alpha-1 antitrypsin-101, MZ Phenotype.  IgE- 98 ANA, rheumatoid factor, CCP  negative   Repeat sputum culture 09/08/2019-Candida albicans, AFB negative  09/13/2019 Hypersensitivity pneumonitis panel negative  High-resolution CT 04/02/2018- mid and lower lung groundglass opacities, mild bronchiectasis and nodularity.  Aortic atherosclerosis, coronary artery calcification I reviewed the images personally.   High-resolution CT  09/25/2019--significant progression, basilar predominant fibrotic lung disease with groundglass, air-trapping.  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 bronchiectasis and recurrent pneumonia who presents for follow-up.  Last seen in the office January 2023.  She is accompanied by her daughters .  She was recently hospitalized in December for acute bronchiectatic exacerbation, superimposed pneumonia, acute respiratory failure in the setting of pulmonary fibrosis.  Patient was treated with IV antibiotics and nebulized bronchodilators.  She was transition to Cipro.  Hospital records show patient left AGAINST MEDICAL ADVICE.  She says she did complete course of antibiotics and feels that she is back to baseline.   CT chest June 10, 2023 showed chronic lung disease with pulmonary fibrosis, progressive areas of groundglass opacity suggestive of superimposed acute alveolitis, stable appearing mediastinal and hilar lymphadenopathy.  She uses an Aflovest a couple of times a week for mucus clearance but acknowledges she should use it more frequently. She previously used a flutter valve but has since transitioned to the vest, which she finds heavy and uncomfortable, prompting her to consider a newer, lighter model.  Her medication regimen includes Trelegy, which was switched from Symbicort,   Patient is not on home oxygen.  Oxygen levels today are 98% on room air.  Previous PFTs in 2022 were essentially normal with no airflow obstruction or restriction.  And normal diffusing capacity.   She does have a history of reflux and previous Lap-Band reversal.  We discussed aspiration precautions.  She does admit to eating and drinking coffee at bedtime.  Advised  to keep head of the bed elevated and avoid eating or  drinking prior to bedtime. She has never smoked  She does have chronic back pain.  He is on pain medications and Neurontin.   Allergies  Allergen Reactions   Levofloxacin Hives, Shortness Of Breath, Swelling and Other (See Comments)    Tolerates Ciprofloxacin RASH IN MOUTH   (can take IV route)   Augmentin [Amoxicillin-Pot Clavulanate] Nausea Only   Bactrim [Sulfamethoxazole-Trimethoprim] Hives and Itching   Clarithromycin     GI upset Other reaction(s): GI Upset (intolerance) GI upset -can take but does cause mild upset    Hydroxyzine Swelling   Lyrica [Pregabalin]    Nexletol [Bempedoic Acid]     Muscle pain   Statins Other (See Comments)    Myalgias   Zetia [Ezetimibe]     Muscle pain    Immunization History  Administered Date(s) Administered   Fluad Quad(high Dose 65+) 04/13/2021, 04/25/2022   Fluad Trivalent(High Dose 65+) 04/20/2023   Influenza, High Dose Seasonal PF 04/11/2017, 03/26/2018   Influenza,inj,Quad PF,6+ Mos 02/22/2014, 03/23/2015, 02/19/2019   Influenza-Unspecified 02/22/2014, 04/11/2017, 03/26/2018, 02/19/2019   Moderna Sars-Covid-2 Vaccination 07/22/2019, 08/12/2019   Pneumococcal Conjugate-13 02/16/2017   Pneumococcal Polysaccharide-23 04/07/2015   Tdap 03/03/2023   Zoster, Live 04/13/2006    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 07/25/2014   Followed in Pulmonary clinic/ [facility]/ [place]    Cardiac dysrhythmia    Chronic idiopathic constipation    Colitis, acute 02/08/2021   Coronary artery disease    Depression, major, recurrent, moderate (HCC)    Headache    Heart murmur    History of bladder infections    History of COVID-19    Hyperlipidemia    Hypertension    Hypothyroidism    Knee pain, bilateral    Melena 02/08/2021   Osteoarthritis    Pneumonia    Primary insomnia    Recovering alcoholic (HCC)    SVT  (supraventricular tachycardia) (HCC)    UTI (urinary tract infection)    Varicose veins    Vitamin B12 deficiency     Tobacco History: Social History   Tobacco Use  Smoking Status Never  Smokeless Tobacco Never   Counseling given: Not Answered   Outpatient Medications Prior to Visit  Medication Sig Dispense Refill   albuterol (VENTOLIN HFA) 108 (90 Base) MCG/ACT inhaler Inhale 2 puffs into the lungs every 6 (six) hours as needed for wheezing or shortness of breath. 8 g 2   ALPRAZolam (XANAX) 0.5 MG tablet TAKE 1 TABLET(0.5 MG) BY MOUTH TWICE DAILY AS NEEDED FOR ANXIETY 60 tablet 0   amitriptyline (ELAVIL) 10 MG tablet Take 1 tablet (10 mg total) by mouth 2 (two) times daily as needed for sleep. 60 tablet 6   amLODipine (NORVASC) 5 MG tablet Take 1 tablet (5 mg total) by mouth daily. 90 tablet 0   Ascorbic Acid (VITAMIN C) 1000 MG tablet Take 1,000 mg by mouth daily.     Cholecalciferol (D3 PO) Take 2 tablets by mouth daily. chewable     ciprofloxacin (CIPRO) 500 MG tablet Take 500 mg by mouth 2 (two) times daily.     collagenase (SANTYL) 250 UNIT/GM ointment Apply 1 Application topically daily. 15 g 1   DULoxetine (CYMBALTA) 60 MG capsule Take 1 capsule (60 mg total) by mouth 2 (two) times daily. 180  capsule 0   Fluticasone-Umeclidin-Vilant (TRELEGY ELLIPTA) 100-62.5-25 MCG/ACT AEPB Inhale 1 puff into the lungs daily. 1 each 11   furosemide (LASIX) 20 MG tablet Take 20 mg by mouth daily.     furosemide (LASIX) 40 MG tablet Take 1 tablet (40 mg total) by mouth daily. 30 tablet 3   gabapentin (NEURONTIN) 300 MG capsule Take 2 capsules (600 mg total) by mouth 3 (three) times daily. (Patient taking differently: Take 600 mg by mouth 3 (three) times daily as needed (pain).) 180 capsule 11   hydrOXYzine (VISTARIL) 25 MG capsule Take 1 capsule (25 mg total) by mouth every 6 (six) hours as needed for itching. 90 capsule 1   levothyroxine (SYNTHROID) 75 MCG tablet TAKE 1 TABLET(75 MCG) BY MOUTH  DAILY 90 tablet 1   lidocaine (LIDODERM) 5 % Place 1 patch onto the skin daily as needed (pain). Remove & Discard patch within 12 hours or as directed by MD     lidocaine-prilocaine (EMLA) cream 1 gram qid prn (Patient taking differently: Apply 1 Application topically 4 (four) times daily as needed (pain).) 30 g 6   lisinopril (ZESTRIL) 2.5 MG tablet Take 2.5 mg by mouth daily.     MAGNESIUM PO Take 1 tablet by mouth daily. chewable     meloxicam (MOBIC) 15 MG tablet Take 15 mg by mouth daily as needed for pain.     metoprolol tartrate (LOPRESSOR) 50 MG tablet TAKE 1 TABLET(50 MG) BY MOUTH TWICE DAILY WITH MEALS 180 tablet 1   montelukast (SINGULAIR) 10 MG tablet Take 10 mg by mouth daily as needed (allergies).     MOUNJARO 12.5 MG/0.5ML Pen ADMINISTER 12.5 MG UNDER THE SKIN 1 TIME A WEEK FOR 16 DOSES 2 mL 3   naloxone (NARCAN) nasal spray 4 mg/0.1 mL Place 1 spray into the nose once. In case of opioid overdose.     nitroGLYCERIN (NITROSTAT) 0.4 MG SL tablet DISSOLVE ONE TABLET UNDER TONGUE AS NEEDED FOR CHEST PAIN EVERY 5 MINUTES FOR UP TO 3 DOSES 25 tablet 1   omeprazole (PRILOSEC) 40 MG capsule Take 40 mg by mouth daily as needed (reflux).     ondansetron (ZOFRAN-ODT) 8 MG disintegrating tablet Take 8 mg by mouth every 8 (eight) hours as needed for nausea.     OXcarbazepine (TRILEPTAL) 150 MG tablet Take 1 tablet (150 mg total) by mouth 2 (two) times daily. 60 tablet 11   oxyCODONE-acetaminophen (PERCOCET) 10-325 MG tablet Take 1 tablet by mouth See admin instructions. Take 1 tablet by mouth 5 times a day. May skip a dose if not needed.     phenazopyridine (PYRIDIUM) 200 MG tablet Take 200 mg by mouth every 8 (eight) hours as needed.     REPATHA SURECLICK 140 MG/ML SOAJ Inject 140 mg into the skin every 14 (fourteen) days.     RESTASIS 0.05 % ophthalmic emulsion Place 1 drop into both eyes 2 (two) times daily as needed (dryness).     tirzepatide (MOUNJARO) 10 MG/0.5ML Pen Inject 10 mg into the  skin once a week. 6 mL 0   tiZANidine (ZANAFLEX) 4 MG tablet Take 4 mg by mouth at bedtime as needed for muscle spasms.     tretinoin (RETIN-A) 0.025 % gel Apply 1 Application topically at bedtime.     valACYclovir (VALTREX) 1000 MG tablet Take 1 tablet (1,000 mg total) by mouth daily. 90 tablet 0   No facility-administered medications prior to visit.     Review of Systems:  Constitutional:   No  weight loss, night sweats,  Fevers, chills, +fatigue, or  lassitude.  HEENT:   No headaches,  Difficulty swallowing,  Tooth/dental problems, or  Sore throat,                No sneezing, itching, ear ache, nasal congestion, post nasal drip,   CV:  No chest pain,  Orthopnea, PND, swelling in lower extremities, anasarca, dizziness, palpitations, syncope.   GI  No heartburn, indigestion, abdominal pain, nausea, vomiting, diarrhea, change in bowel habits, loss of appetite, bloody stools.   Resp:   No chest wall deformity  Skin: no rash or lesions.  GU: no dysuria, change in color of urine, no urgency or frequency.  No flank pain, no hematuria   MS:  No joint pain or swelling.  No decreased range of motion.  No back pain.    Physical Exam  BP 120/80 (BP Location: Left Arm, Patient Position: Sitting, Cuff Size: Normal)   Pulse 65   Ht 5\' 5"  (1.651 m)   Wt 157 lb 12.8 oz (71.6 kg)   SpO2 98%   BMI 26.26 kg/m   GEN: A/Ox3; pleasant , NAD, well nourished    HEENT:  [place]/AT,  , NOSE-clear, THROAT-clear, no lesions, no postnasal drip or exudate noted.   NECK:  Supple w/ fair ROM; no JVD; normal carotid impulses w/o bruits; no thyromegaly or nodules palpated; no lymphadenopathy.    RESP bibasilar crackles  no accessory muscle use, no dullness to percussion  CARD:  RRR, no m/r/g, no peripheral edema, pulses intact, no cyanosis or clubbing.  GI:   Soft & nt; nml bowel sounds; no organomegaly or masses detected.   Musco: Warm bil, no deformities or joint swelling noted.   Neuro: alert,  no focal deficits noted.    Skin: Warm, no lesions or rashes    Lab Results:  CBC    Component Value Date/Time   WBC 8.3 08/29/2023 1537   WBC 6.6 06/11/2023 0600   RBC 3.78 08/29/2023 1537   RBC 3.71 (L) 06/11/2023 0600   HGB 11.9 08/29/2023 1537   HCT 35.8 08/29/2023 1537   PLT 283 08/29/2023 1537   MCV 95 08/29/2023 1537   MCH 31.5 08/29/2023 1537   MCH 31.5 06/11/2023 0600   MCHC 33.2 08/29/2023 1537   MCHC 33.2 06/11/2023 0600   RDW 12.5 08/29/2023 1537   LYMPHSABS 2.8 08/29/2023 1537   MONOABS 0.9 06/10/2023 1335   EOSABS 1.1 (H) 08/29/2023 1537   BASOSABS 0.1 08/29/2023 1537    BMET BNP    Component Value Date/Time   BNP 30.8 03/03/2023 0728    ProBNP No results found for: "PROBNP"  Imaging: No results found.  Administration History     None          Latest Ref Rng & Units 02/11/2021   11:53 AM 03/21/2018   11:39 AM  PFT Results  FVC-Pre L 2.27  2.54   FVC-Predicted Pre % 83  90   FVC-Post L 2.46  2.53   FVC-Predicted Post % 90  90   Pre FEV1/FVC % % 90  89   Post FEV1/FCV % % 87  90   FEV1-Pre L 2.04  2.27   FEV1-Predicted Pre % 99  107   FEV1-Post L 2.14  2.29   DLCO uncorrected ml/min/mmHg 14.87  20.06   DLCO UNC% % 80  87   DLCO corrected ml/min/mmHg 15.36  19.71   DLCO COR %Predicted %  82  86   DLVA Predicted % 97  104   TLC L 4.00  4.17   TLC % Predicted % 81  85   RV % Predicted % 71  69     Lab Results  Component Value Date   NITRICOXIDE 24 03/20/2018        Assessment & Plan:  Assessment and Plan    Interstitial lung disease-interstitial changes dating back to August 2019 on CT imaging.  Definite progression on serial CT 2021.  Last PFTs in 2022 have essentially remained normal with no significant airflow obstruction or restriction and diffusing capacity at 80%. Distribution pattern with appearance of possible chronic hypersensitivity pneumonitis vs fibrotic NSIP-not UIP. Alpha-1 testing did show MZ phenotype with  level at 101.  Rheumatoid factor and ANA were both negative. On return would repeat alpha-1 level.  Consider adding an ACE level. ESR, Sjogren's panel  Will repeat high-resolution CT chest and PFTs in 3 months. Currently without significant symptom burden.  Not on oxygen. Previous usage of nitrofurantoin.  History of COVID-19 infection.  Has gotten rid of down comforter.  Discussed pulmonary rehab.  Patient declines at this time.  Bronchiectasis - Chronic bronchiectasis with recurrent pneumonia.  Questionable recurrent aspiration.  Encouraged on aspiration precautions.   Continue with mucociliary clearance.  Would recommend using app lobe left daily.  Will order new lighter weight vest to encourage compliance.    A high-resolution CT chest and pulmonary function test are scheduled for June. Encourage daily Aflovest use, preferably twice a day during flare-ups, and order a new Aflovest if the current one is too heavy. Ensure vaccinations are up to date and advise on hand hygiene and infection prevention. Discussed the importance of preventing exacerbations to avoid further fibrosis progression.  Continue on Trelegy daily.   Chronic back pain   Chronic back pain is managed with gabapentin, with a history of four back surgeries. She experiences pain in the back and legs  Cautioned about the aspiration risk with sedating medications and advised on lifestyle modifications to prevent aspiration, such as elevating the head of the bed and avoiding late-night eating.     GERD continue on current regimen and GERD diet.     I spent 43   minutes dedicated to the care of this patient on the date of this encounter to include pre-visit review of records, face-to-face time with the patient discussing conditions above, post visit ordering of testing, clinical documentation with the electronic health record, making appropriate referrals as documented, and communicating necessary findings to members of the  patients care team.    Rubye Oaks, NP 09/08/2023

## 2023-09-08 NOTE — Patient Instructions (Addendum)
 Set up for HRCT chest in June 2025  Continue on Trelegy inhaler 1 puff daily, rinse after use Increase Vest use to daily.  Order for new Vest.  Use caution with sedating medications  No eating 2-3 hrs prior to bedtime.  Follow-up in 3 months with PFT and review CT chest-Dr [name] -ILD

## 2023-09-11 ENCOUNTER — Ambulatory Visit (HOSPITAL_BASED_OUTPATIENT_CLINIC_OR_DEPARTMENT_OTHER)
Admission: EM | Admit: 2023-09-11 | Discharge: 2023-09-11 | Disposition: A | Discharge status: Home / Self Care | Attending: Family Medicine | Admitting: Family Medicine

## 2023-09-11 ENCOUNTER — Encounter (HOSPITAL_BASED_OUTPATIENT_CLINIC_OR_DEPARTMENT_OTHER): Payer: Self-pay

## 2023-09-11 DIAGNOSIS — R102 Pelvic and perineal pain: Secondary | ICD-10-CM | POA: Diagnosis not present

## 2023-09-11 LAB — POCT URINALYSIS DIP (MANUAL ENTRY)
Bilirubin, UA: NEGATIVE
Blood, UA: NEGATIVE
Glucose, UA: NEGATIVE mg/dL
Ketones, POC UA: NEGATIVE mg/dL
Leukocytes, UA: NEGATIVE
Nitrite, UA: NEGATIVE
Protein Ur, POC: NEGATIVE mg/dL
Spec Grav, UA: 1.01
Urobilinogen, UA: 0.2 U/dL
pH, UA: 7

## 2023-09-11 NOTE — Discharge Instructions (Addendum)
 Come back tomorrow at 1 pm for ultrasound.  Drink 32 ounzes of water and do not pee hour before exam.

## 2023-09-11 NOTE — ED Triage Notes (Signed)
 LLQ abdominal pain. States has her ovaries but uterus removed.  Would like an ultrasound done. Does not have an OB/GYN and can't be seen until May.Denies fever, n/v/d.

## 2023-09-11 NOTE — ED Provider Notes (Signed)
 [name]    CSN: [id number] Arrival date & time: 09/11/23  1104      History   Chief Complaint Chief Complaint  Patient presents with   Abdominal Pain    HPI Courtney [name] is a 78 y.o. female.   78 year old female presents today with left pelvic pain.  This started approximately 1 month ago.  The pain is constant but varies in intensity.  She describes the pain as aching, throbbing.  The pain gets worse at times but feels like it is worsened over the last couple of days.  Denies any injuries to the area.  Denies any vaginal discharge or urinary complaints.  Was recently treated for UTI.  No fever or chills. Has appointment with GYN in May. Has not had exam in 10 years.    Abdominal Pain   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 07/25/2014   Followed in Pulmonary clinic/ [facility]/ [place]    Cardiac dysrhythmia    Chronic idiopathic constipation    Colitis, acute 02/08/2021   Coronary artery disease    Depression, major, recurrent, moderate (HCC)    Headache    Heart murmur    History of bladder infections    History of COVID-19    Hyperlipidemia    Hypertension    Hypothyroidism    Knee pain, bilateral    Melena 02/08/2021   Osteoarthritis    Pneumonia    Primary insomnia    Recovering alcoholic (HCC)    SVT (supraventricular tachycardia) (HCC)    UTI (urinary tract infection)    Varicose veins    Vitamin B12 deficiency     Patient Active Problem List   Diagnosis Date Noted   Other fatigue 08/29/2023   Dysuria 08/29/2023   Acute diarrhea 07/28/2023   Persistent cough 07/24/2023   Non-recurrent acute suppurative otitis media of both ears without spontaneous rupture of tympanic membranes 07/14/2023   Hyponatremia 06/11/2023   Hypokalemia 06/11/2023   Acute respiratory failure with hypoxia (HCC) 06/10/2023   Interstitial lung  disease (HCC) 06/10/2023   Encounter for immunization 04/20/2023   Moderate recurrent major depression (HCC) 03/20/2023   Encounter for post fall examination 03/10/2023   Localized edema 03/04/2023   Post herpetic neuralgia 02/28/2023   Hypertension 02/28/2023   Acute cough 02/15/2023   Left flank pain 12/15/2022   Diverticulitis 12/15/2022   Malaise 09/06/2022   Lower extremity edema 09/06/2022   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 07/27/2022   Urinary urgency 07/13/2022   GAD (generalized anxiety disorder) 07/10/2022   Overweight with body mass index (BMI) of 25 to 25.9 in adult 07/10/2022   Chronic low back pain 06/08/2022   Pernicious anemia 04/25/2022   Syncope 02/08/2022   Difficulty urinating 12/10/2021   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 10/24/2021   Stage 3b chronic kidney disease (HCC) 10/24/2021   Vitamin D insufficiency 10/24/2021   Shoulder impingement syndrome, right 09/30/2021   Mild recurrent major depression (HCC) 07/19/2021   Telogen effluvium 04/17/2021   Prediabetes 04/17/2021   Coronary artery disease involving native coronary artery of native heart without angina pectoris 04/17/2021   History of percutaneous coronary intervention 12/23/2020   Hypothyroidism 10/06/2019   Chronic idiopathic constipation 10/06/2019   Non-seasonal allergic rhinitis due to pollen 10/06/2019   Mixed hyperlipidemia 10/02/2019   Supraventricular tachycardia (HCC) 10/02/2019   Myalgia  due to statin 10/02/2019   Peripheral vascular disease (HCC) 09/11/2019   Bronchiectasis without complication (HCC) 08/23/2019   Spinal stenosis of lumbar region with neurogenic claudication 09/03/2018   Family history of early CAD 04/09/2018   Spondylolisthesis of lumbar region 07/29/2015   Varicose veins of both lower extremities with pain 02/10/2015    Past Surgical History:  Procedure Laterality Date   ABDOMINAL HYSTERECTOMY  1991   APPENDECTOMY     BACK SURGERY     had 2  surgeries. Have rods placed in back    BREAST BIOPSY Left    x2   BREAST ENHANCEMENT SURGERY  2006   CARDIAC CATHETERIZATION  12/22/2020   stents placed   CHOLECYSTECTOMY  12/2019   removed lap band.    COLONOSCOPY  12/01/2016   Moderate predominantly sigmod diverticulosis. Otherwise grossly normal colonoscopy   EYE SURGERY     IOL- bilateral - [place]   KNEE ARTHROSCOPY Left    lap band surgery  1981   LUMBAR FUSION  07/29/2015   posterior level one   removal of cervical disc fragments  10/2007   pt. denies    TUBAL LIGATION  1970    OB History   No obstetric history on file.      Home Medications    Prior to Admission medications   Medication Sig Start Date End Date Taking? Authorizing Provider  albuterol (VENTOLIN HFA) 108 (90 Base) MCG/ACT inhaler Inhale 2 puffs into the lungs every 6 (six) hours as needed for wheezing or shortness of breath. 07/14/23   Renne Crigler, FNP  ALPRAZolam Prudy Feeler) 0.5 MG tablet TAKE 1 TABLET(0.5 MG) BY MOUTH TWICE DAILY AS NEEDED FOR ANXIETY 06/09/23   Windell Moment, MD  amitriptyline (ELAVIL) 10 MG tablet Take 1 tablet (10 mg total) by mouth 2 (two) times daily as needed for sleep. 06/27/23   Levert Feinstein, MD  amLODipine (NORVASC) 5 MG tablet Take 1 tablet (5 mg total) by mouth daily. 06/15/23   CoxFritzi Mandes, MD  Ascorbic Acid (VITAMIN C) 1000 MG tablet Take 1,000 mg by mouth daily.    [provider]  Cholecalciferol (D3 PO) Take 2 tablets by mouth daily. chewable    [provider]  ciprofloxacin (CIPRO) 500 MG tablet Take 500 mg by mouth 2 (two) times daily.    [provider]  collagenase (SANTYL) 250 UNIT/GM ointment Apply 1 Application topically daily. 07/28/23   Sirivol, Kristie Cowman, MD  DULoxetine (CYMBALTA) 60 MG capsule Take 1 capsule (60 mg total) by mouth 2 (two) times daily. 08/29/23 11/27/23  Windell Moment, MD  Fluticasone-Umeclidin-Vilant (TRELEGY ELLIPTA) 100-62.5-25 MCG/ACT AEPB Inhale 1 puff into the  lungs daily. 07/28/23   Sirivol, Kristie Cowman, MD  furosemide (LASIX) 20 MG tablet Take 20 mg by mouth daily.    [provider]  furosemide (LASIX) 40 MG tablet Take 1 tablet (40 mg total) by mouth daily. 03/02/23   [name], Fritzi Mandes, MD  gabapentin (NEURONTIN) 300 MG capsule Take 2 capsules (600 mg total) by mouth 3 (three) times daily. Patient taking differently: Take 600 mg by mouth 3 (three) times daily as needed (pain). 04/24/23   Levert Feinstein, MD  hydrOXYzine (VISTARIL) 25 MG capsule Take 1 capsule (25 mg total) by mouth every 6 (six) hours as needed for itching. 07/05/23   [name], Fritzi Mandes, MD  levothyroxine (SYNTHROID) 75 MCG tablet TAKE 1 TABLET(75 MCG) BY MOUTH DAILY 05/22/23   [name], Kirsten, MD  lidocaine (LIDODERM) 5 % Place 1 patch onto  the skin daily as needed (pain). Remove & Discard patch within 12 hours or as directed by MD    [provider]  lidocaine-prilocaine (EMLA) cream 1 gram qid prn Patient taking differently: Apply 1 Application topically 4 (four) times daily as needed (pain). 04/24/23   Levert Feinstein, MD  lisinopril (ZESTRIL) 2.5 MG tablet Take 2.5 mg by mouth daily. 04/06/23   [provider]  MAGNESIUM PO Take 1 tablet by mouth daily. chewable    [provider]  meloxicam (MOBIC) 15 MG tablet Take 15 mg by mouth daily as needed for pain.    [provider]  metoprolol tartrate (LOPRESSOR) 50 MG tablet TAKE 1 TABLET(50 MG) BY MOUTH TWICE DAILY WITH MEALS 05/22/23   [name], Kirsten, MD  montelukast (SINGULAIR) 10 MG tablet Take 10 mg by mouth daily as needed (allergies).    [provider]  MOUNJARO 12.5 MG/0.5ML Pen ADMINISTER 12.5 MG UNDER THE SKIN 1 TIME A WEEK FOR 16 DOSES 08/14/23   Renne Crigler, FNP  naloxone [facility]) nasal spray 4 mg/0.1 mL Place 1 spray into the nose once. In case of opioid overdose.    [provider]  nitroGLYCERIN (NITROSTAT) 0.4 MG SL tablet DISSOLVE ONE TABLET UNDER TONGUE AS NEEDED FOR CHEST PAIN EVERY 5  MINUTES FOR UP TO 3 DOSES 02/24/23   Windell Moment, MD  omeprazole (PRILOSEC) 40 MG capsule Take 40 mg by mouth daily as needed (reflux).    [provider]  ondansetron (ZOFRAN-ODT) 8 MG disintegrating tablet Take 8 mg by mouth every 8 (eight) hours as needed for nausea. 06/09/23   [provider]  OXcarbazepine (TRILEPTAL) 150 MG tablet Take 1 tablet (150 mg total) by mouth 2 (two) times daily. 04/24/23   Levert Feinstein, MD  oxyCODONE-acetaminophen (PERCOCET) 10-325 MG tablet Take 1 tablet by mouth See admin instructions. Take 1 tablet by mouth 5 times a day. May skip a dose if not needed.    [provider]  phenazopyridine (PYRIDIUM) 200 MG tablet Take 200 mg by mouth every 8 (eight) hours as needed. 05/06/23   [provider]  REPATHA SURECLICK 140 MG/ML SOAJ Inject 140 mg into the skin every 14 (fourteen) days.    [provider]  RESTASIS 0.05 % ophthalmic emulsion Place 1 drop into both eyes 2 (two) times daily as needed (dryness). 01/09/23   [provider]  tirzepatide Greggory Keen) 10 MG/0.5ML Pen Inject 10 mg into the skin once a week. 04/03/23   [name], Fritzi Mandes, MD  tiZANidine (ZANAFLEX) 4 MG tablet Take 4 mg by mouth at bedtime as needed for muscle spasms. 06/07/23   [provider]  tretinoin (RETIN-A) 0.025 % gel Apply 1 Application topically at bedtime.    [provider]  valACYclovir (VALTREX) 1000 MG tablet Take 1 tablet (1,000 mg total) by mouth daily. 04/07/23   CoxFritzi Mandes, MD    Family History Family History  Problem Relation Age of Onset   Heart disease Mother    Heart disease Father    Diabetes Sister    Other Sister        BRAIN TUMOR   Heart disease Brother    Heart disease Brother    Heart disease Brother    Heart disease Brother    Heart disease Brother    Colon cancer Maternal Aunt    Colon cancer Cousin     Social History Social History   Tobacco Use   Smoking status: Never  Smokeless  tobacco: Never  Vaping Use   Vaping status: Never Used  Substance Use Topics   Alcohol use: No    Alcohol/week: 0.0 standard drinks of alcohol    Comment: recovery x 20 yrs   Drug use: No     Allergies   Levofloxacin, Augmentin [amoxicillin-pot clavulanate], Bactrim [sulfamethoxazole-trimethoprim], Clarithromycin, Hydroxyzine, Lyrica [pregabalin], Nexletol [bempedoic acid], Statins, and Zetia [ezetimibe]   Review of Systems Review of Systems  Gastrointestinal:  Positive for abdominal pain.     Physical Exam Triage Vital Signs ED Triage Vitals  Encounter Vitals Group     BP 09/11/23 1126 133/84     Systolic BP Percentile --      Diastolic BP Percentile --      Pulse Rate 09/11/23 1126 79     Resp 09/11/23 1126 20     Temp 09/11/23 1126 98.4 F (36.9 C)     Temp Source 09/11/23 1126 Oral     SpO2 09/11/23 1126 94 %     Weight --      Height --      Head Circumference --      Peak Flow --      Pain Score 09/11/23 1129 9     Pain Loc --      Pain Education --      Exclude from Growth Chart --    No data found.  Updated Vital Signs BP 133/84 (BP Location: Right Arm)   Pulse 79   Temp 98.4 F (36.9 C) (Oral)   Resp 20   SpO2 94%   Visual Acuity Right Eye Distance:   Left Eye Distance:   Bilateral Distance:    Right Eye Near:   Left Eye Near:    Bilateral Near:     Physical Exam Constitutional:      General: She is not in acute distress.    Appearance: She is well-developed. She is not ill-appearing or toxic-appearing.  Pulmonary:     Effort: Pulmonary effort is normal.  Abdominal:     Palpations: Abdomen is soft.     Tenderness: There is abdominal tenderness in the suprapubic area. There is no guarding or rebound.     Comments: TTP left pelvic region.   Skin:    General: Skin is warm and dry.  Neurological:     Mental Status: She is alert.  Psychiatric:        Mood and Affect: Mood normal.      UC Treatments / Results  Labs (all labs  ordered are listed, but only abnormal results are displayed) Labs Reviewed  POCT URINALYSIS DIP (MANUAL ENTRY)    EKG   Radiology No results found.  Procedures Procedures (including critical care time)  Medications Ordered in UC Medications - No data to display  Initial Impression / Assessment and Plan / UC Course  I have reviewed the triage vital signs and the nursing notes.  Pertinent labs & imaging results that were available during my care of the patient were reviewed by me and considered in my medical decision making (see chart for details).     Pelvic pain-Concern for ovarian mass. Ultrasound not available today.  Patient will come back tomorrow outpatient at 1 PM for pelvic ultrasound to rule out ovarian mass, or other underlying issue. Patient aware of instructions prior to appointment  Final Clinical Impressions(s) / UC Diagnoses   Final diagnoses:  Pelvic pain     Discharge Instructions      Come  back tomorrow at 1 pm for ultrasound.  Drink 32 ounzes of water and do not pee hour before exam.       ED Prescriptions   None    PDMP not reviewed this encounter.   Janace Aris, FNP 09/11/23 1229

## 2023-09-12 ENCOUNTER — Other Ambulatory Visit (HOSPITAL_BASED_OUTPATIENT_CLINIC_OR_DEPARTMENT_OTHER): Payer: Self-pay | Admitting: Family Medicine

## 2023-09-12 ENCOUNTER — Other Ambulatory Visit (HOSPITAL_BASED_OUTPATIENT_CLINIC_OR_DEPARTMENT_OTHER): Admitting: Radiology

## 2023-09-12 DIAGNOSIS — R102 Pelvic and perineal pain: Secondary | ICD-10-CM

## 2023-09-12 DIAGNOSIS — Z780 Asymptomatic menopausal state: Secondary | ICD-10-CM

## 2023-09-14 ENCOUNTER — Other Ambulatory Visit: Payer: Self-pay

## 2023-09-14 ENCOUNTER — Ambulatory Visit (INDEPENDENT_AMBULATORY_CARE_PROVIDER_SITE_OTHER)
Admission: RE | Admit: 2023-09-14 | Discharge: 2023-09-14 | Disposition: A | Discharge status: Home / Self Care | Source: Ambulatory Visit | Attending: Family Medicine | Admitting: Family Medicine

## 2023-09-14 ENCOUNTER — Telehealth: Payer: Self-pay | Admitting: Adult Health

## 2023-09-14 DIAGNOSIS — J849 Interstitial pulmonary disease, unspecified: Secondary | ICD-10-CM

## 2023-09-14 DIAGNOSIS — R102 Pelvic and perineal pain: Secondary | ICD-10-CM

## 2023-09-14 DIAGNOSIS — J479 Bronchiectasis, uncomplicated: Secondary | ICD-10-CM

## 2023-09-14 DIAGNOSIS — Z780 Asymptomatic menopausal state: Secondary | ICD-10-CM

## 2023-09-14 NOTE — Telephone Encounter (Signed)
 Tammy saw the patient on 09/08/23 and it looks like she mentioned to order new afflow vest but an order wasn't placed. Daughter was asking about it

## 2023-09-14 NOTE — Telephone Encounter (Signed)
 Yes was suppose to order . Can triage help with order for Aflovest for patient please  Bronchiectasis Dx.

## 2023-09-15 ENCOUNTER — Encounter: Payer: Self-pay | Admitting: Family Medicine

## 2023-09-21 NOTE — Addendum Note (Signed)
 Addended by: Delrae Rend on: 09/21/2023 04:21 PM   Modules accepted: Orders

## 2023-09-21 NOTE — Telephone Encounter (Signed)
Order placed, nothing further needed. 

## 2023-09-28 ENCOUNTER — Ambulatory Visit

## 2023-09-28 ENCOUNTER — Ambulatory Visit (HOSPITAL_BASED_OUTPATIENT_CLINIC_OR_DEPARTMENT_OTHER)
Admission: RE | Admit: 2023-09-28 | Discharge: 2023-09-28 | Disposition: A | Discharge status: Home / Self Care | Source: Ambulatory Visit | Admitting: Radiology

## 2023-09-28 VITALS — BP 116/72 | HR 81 | Temp 97.6°F | Ht 65.0 in | Wt 157.4 lb

## 2023-09-28 DIAGNOSIS — Z1211 Encounter for screening for malignant neoplasm of colon: Secondary | ICD-10-CM | POA: Insufficient documentation

## 2023-09-28 DIAGNOSIS — M25552 Pain in left hip: Secondary | ICD-10-CM

## 2023-09-28 DIAGNOSIS — J301 Allergic rhinitis due to pollen: Secondary | ICD-10-CM | POA: Diagnosis not present

## 2023-09-28 MED ORDER — PREDNISONE 20 MG PO TABS: 40.0000 mg | ORAL_TABLET | Freq: Every day | ORAL | 0 refills | Status: AC

## 2023-09-28 NOTE — Progress Notes (Signed)
 Subjective:  Patient ID: Courtney [name], female    DOB: 21-Sep-1945  Age: 78 y.o. MRN: [id number]  Chief Complaint  Patient presents with   Hip Pain    left    Discussed the use of AI scribe software for clinical note transcription with the patient, who gave verbal consent to proceed.   Courtney [name] is a 78 year old female who presents with left hip and pelvic pain.  She has been experiencing left hip and pelvic pain that began after a fall in her yard approximately two months ago. The pain originates in the left hip and low back, radiating from the front of the groin to the back, and is described as similar to menstrual cramps, despite having had a hysterectomy. An ultrasound performed on September 14, 2023, did not reveal any abnormalities, and her ovaries were not visible due to age-related changes. She has not had any significant relief from prior evaluations and is seeking further imaging to assess the hip joint.  She has a history of lumbar spine issues, including prior fusions and arthritis in multiple areas of her spine. A CT scan of the lumbar spine was performed in January 2025, and she is scheduled for an MRI next Tuesday as part of the work-up for a potential spinal stimulator. She does not believe the current pain is related to her known spinal issues.  She experiences ongoing issues with allergies, with symptoms such as teary eyes and headaches, particularly in the morning. She has not had a steroid injection for over a year and is considering oral prednisone to manage her symptoms.  She has a history of shingles, which began in September 2024, and expresses concern about recurrence.  She is awaiting a colonoscopy and has a history of mild SI joint degenerative changes noted on a pelvic x-ray from May 2022.      09/28/2023   11:01 AM 07/14/2023   10:11 AM 03/20/2023   11:25 AM 12/12/2022    3:29 PM 07/06/2022    9:45 AM  Depression screen PHQ 2/9  Decreased Interest 0 0 3 3 3    Down, Depressed, Hopeless 0 0 3 3 3   PHQ - 2 Score 0 0 6 6 6   Altered sleeping   3 3 3   Tired, decreased energy   3 3 3   Change in appetite   0 0 1  Feeling bad or failure about yourself    0 0 3  Trouble concentrating   1 2 3   Moving slowly or fidgety/restless   0 0 0  Suicidal thoughts   0 0 0  PHQ-9 Score   13 14 19   Difficult doing work/chores   Somewhat difficult Not difficult at all Very difficult        09/28/2023   11:01 AM  Fall Risk   Falls in the past year? 1  Number falls in past yr: 1  Injury with Fall? 1  Risk for fall due to : History of fall(s)    Patient Care Team: Blane Ohara, MD as PCP - General (Internal Medicine) Corky Crafts, MD as PCP - Cardiology (Cardiology) McGukin, Luvenia Redden., MD (Cardiology) Chilton Greathouse, MD as Consulting Physician (Pulmonary Disease) Zettie Pho, [facility] (Inactive) (Pharmacist)   Review of Systems  Constitutional:  Negative for chills, fatigue and fever.  HENT:  Positive for rhinorrhea and sinus pressure. Negative for congestion, ear pain and sore throat.   Respiratory:  Negative for cough and shortness  of breath.   Cardiovascular:  Negative for chest pain.  Gastrointestinal:  Negative for abdominal pain, constipation, diarrhea, nausea and vomiting.  Genitourinary:  Negative for dysuria and frequency.  Musculoskeletal:  Positive for arthralgias, back pain and myalgias.  Neurological:  Negative for dizziness and headaches.  Psychiatric/Behavioral:  Negative for dysphoric mood. The patient is not nervous/anxious.     Current Outpatient Medications on File Prior to Visit  Medication Sig Dispense Refill   albuterol (VENTOLIN HFA) 108 (90 Base) MCG/ACT inhaler Inhale 2 puffs into the lungs every 6 (six) hours as needed for wheezing or shortness of breath. 8 g 2   ALPRAZolam (XANAX) 0.5 MG tablet TAKE 1 TABLET(0.5 MG) BY MOUTH TWICE DAILY AS NEEDED FOR ANXIETY 60 tablet 0   amitriptyline (ELAVIL) 10 MG tablet Take 1  tablet (10 mg total) by mouth 2 (two) times daily as needed for sleep. 60 tablet 6   amLODipine (NORVASC) 5 MG tablet Take 1 tablet (5 mg total) by mouth daily. 90 tablet 0   Ascorbic Acid (VITAMIN C) 1000 MG tablet Take 1,000 mg by mouth daily.     Cholecalciferol (D3 PO) Take 2 tablets by mouth daily. chewable     ciprofloxacin (CIPRO) 500 MG tablet Take 500 mg by mouth 2 (two) times daily.     collagenase (SANTYL) 250 UNIT/GM ointment Apply 1 Application topically daily. 15 g 1   DULoxetine (CYMBALTA) 60 MG capsule Take 1 capsule (60 mg total) by mouth 2 (two) times daily. 180 capsule 0   Fluticasone-Umeclidin-Vilant (TRELEGY ELLIPTA) 100-62.5-25 MCG/ACT AEPB Inhale 1 puff into the lungs daily. 1 each 11   furosemide (LASIX) 20 MG tablet Take 20 mg by mouth daily.     furosemide (LASIX) 40 MG tablet Take 1 tablet (40 mg total) by mouth daily. 30 tablet 3   gabapentin (NEURONTIN) 300 MG capsule Take 2 capsules (600 mg total) by mouth 3 (three) times daily. (Patient taking differently: Take 600 mg by mouth 3 (three) times daily as needed (pain).) 180 capsule 11   hydrOXYzine (VISTARIL) 25 MG capsule Take 1 capsule (25 mg total) by mouth every 6 (six) hours as needed for itching. 90 capsule 1   levothyroxine (SYNTHROID) 75 MCG tablet TAKE 1 TABLET(75 MCG) BY MOUTH DAILY 90 tablet 1   lidocaine (LIDODERM) 5 % Place 1 patch onto the skin daily as needed (pain). Remove & Discard patch within 12 hours or as directed by MD     lidocaine-prilocaine (EMLA) cream 1 gram qid prn (Patient taking differently: Apply 1 Application topically 4 (four) times daily as needed (pain).) 30 g 6   lisinopril (ZESTRIL) 2.5 MG tablet Take 2.5 mg by mouth daily.     MAGNESIUM PO Take 1 tablet by mouth daily. chewable     meloxicam (MOBIC) 15 MG tablet Take 15 mg by mouth daily as needed for pain.     metoprolol tartrate (LOPRESSOR) 50 MG tablet TAKE 1 TABLET(50 MG) BY MOUTH TWICE DAILY WITH MEALS 180 tablet 1    montelukast (SINGULAIR) 10 MG tablet Take 10 mg by mouth daily as needed (allergies).     MOUNJARO 12.5 MG/0.5ML Pen ADMINISTER 12.5 MG UNDER THE SKIN 1 TIME A WEEK FOR 16 DOSES 2 mL 3   naloxone (NARCAN) nasal spray 4 mg/0.1 mL Place 1 spray into the nose once. In case of opioid overdose.     nitroGLYCERIN (NITROSTAT) 0.4 MG SL tablet DISSOLVE ONE TABLET UNDER TONGUE AS NEEDED FOR CHEST PAIN  EVERY 5 MINUTES FOR UP TO 3 DOSES 25 tablet 1   omeprazole (PRILOSEC) 40 MG capsule Take 40 mg by mouth daily as needed (reflux).     ondansetron (ZOFRAN-ODT) 8 MG disintegrating tablet Take 8 mg by mouth every 8 (eight) hours as needed for nausea.     OXcarbazepine (TRILEPTAL) 150 MG tablet Take 1 tablet (150 mg total) by mouth 2 (two) times daily. 60 tablet 11   oxyCODONE-acetaminophen (PERCOCET) 10-325 MG tablet Take 1 tablet by mouth See admin instructions. Take 1 tablet by mouth 5 times a day. May skip a dose if not needed.     phenazopyridine (PYRIDIUM) 200 MG tablet Take 200 mg by mouth every 8 (eight) hours as needed.     REPATHA SURECLICK 140 MG/ML SOAJ Inject 140 mg into the skin every 14 (fourteen) days.     RESTASIS 0.05 % ophthalmic emulsion Place 1 drop into both eyes 2 (two) times daily as needed (dryness).     tirzepatide (MOUNJARO) 10 MG/0.5ML Pen Inject 10 mg into the skin once a week. 6 mL 0   tiZANidine (ZANAFLEX) 4 MG tablet Take 4 mg by mouth at bedtime as needed for muscle spasms.     tretinoin (RETIN-A) 0.025 % gel Apply 1 Application topically at bedtime.     valACYclovir (VALTREX) 1000 MG tablet Take 1 tablet (1,000 mg total) by mouth daily. 90 tablet 0   No current facility-administered medications on file prior to visit.   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 07/25/2014   Followed in Pulmonary clinic/ [facility]/ [place]    Cardiac dysrhythmia    Chronic idiopathic  constipation    Colitis, acute 02/08/2021   Coronary artery disease    Depression, major, recurrent, moderate (HCC)    Headache    Heart murmur    History of bladder infections    History of COVID-19    Hyperlipidemia    Hypertension    Hypothyroidism    Knee pain, bilateral    Melena 02/08/2021   Osteoarthritis    Pneumonia    Primary insomnia    Recovering alcoholic (HCC)    SVT (supraventricular tachycardia) (HCC)    UTI (urinary tract infection)    Varicose veins    Vitamin B12 deficiency    Past Surgical History:  Procedure Laterality Date   ABDOMINAL HYSTERECTOMY  1991   APPENDECTOMY     BACK SURGERY     had 2 surgeries. Have rods placed in back    BREAST BIOPSY Left    x2   BREAST ENHANCEMENT SURGERY  2006   CARDIAC CATHETERIZATION  12/22/2020   stents placed   CHOLECYSTECTOMY  12/2019   removed lap band.    COLONOSCOPY  12/01/2016   Moderate predominantly sigmod diverticulosis. Otherwise grossly normal colonoscopy   EYE SURGERY     IOL- bilateral - [place]   KNEE ARTHROSCOPY Left    lap band surgery  1981   LUMBAR FUSION  07/29/2015   posterior level one   removal of cervical disc fragments  10/2007   pt. denies    TUBAL LIGATION  1970    Family History  Problem Relation Age of Onset   Heart disease Mother    Heart disease Father    Diabetes Sister    Other Sister        BRAIN TUMOR  Heart disease Brother    Heart disease Brother    Heart disease Brother    Heart disease Brother    Heart disease Brother    Colon cancer Maternal Aunt    Colon cancer Cousin    Social History   Socioeconomic History   Marital status: Widowed    Spouse name: Not on file   Number of children: 2   Years of education: Not on file   Highest education level: Not on file  Occupational History   Occupation: retired    Comment: nurse  Tobacco Use   Smoking status: Never   Smokeless tobacco: Never  Vaping Use   Vaping status: Never Used  Substance and Sexual  Activity   Alcohol use: No    Alcohol/week: 0.0 standard drinks of alcohol    Comment: recovery x 20 yrs   Drug use: No   Sexual activity: Not Currently    Comment: MARRIED  Other Topics Concern   Not on file  Social History Narrative   Not on file   Social Drivers of Health   Financial Resource Strain: Low Risk  (12/12/2022)   Overall Financial Resource Strain (CARDIA)    Difficulty of Paying Living Expenses: Not hard at all  Food Insecurity: No Food Insecurity (06/10/2023)   Hunger Vital Sign    Worried About Running Out of Food in the Last Year: Never true    Ran Out of Food in the Last Year: Never true  Transportation Needs: No Transportation Needs (06/10/2023)   PRAPARE - Administrator, Civil Service (Medical): No    Lack of Transportation (Non-Medical): No  Physical Activity: Inactive (12/12/2022)   Exercise Vital Sign    Days of Exercise per Week: 0 days    Minutes of Exercise per Session: 0 min  Stress: No Stress Concern Present (12/12/2022)   [employer] of Occupational Health - Occupational Stress Questionnaire    Feeling of Stress : Not at all  Social Connections: Moderately Isolated (12/12/2022)   Social Connection and Isolation Panel [NHANES]    Frequency of Communication with Friends and Family: Three times a week    Frequency of Social Gatherings with Friends and Family: Three times a week    Attends Religious Services: More than 4 times per year    Active Member of Clubs or Organizations: No    Attends Banker Meetings: Never    Marital Status: Widowed    Objective:  BP 116/72   Pulse 81   Temp 97.6 F (36.4 C)   Ht 5\' 5"  (1.651 m)   Wt 157 lb 6.4 oz (71.4 kg)   SpO2 97%   BMI 26.19 kg/m      09/28/2023   10:56 AM 09/11/2023   11:26 AM 09/08/2023   11:30 AM  BP/Weight  Systolic BP 116 133 120  Diastolic BP 72 84 80  Wt. (Lbs) 157.4  157.8  BMI 26.19 kg/m2  26.26 kg/m2    Physical Exam Vitals and nursing note  reviewed.  Constitutional:      Appearance: Normal appearance.  HENT:     Head: Normocephalic and atraumatic.  Cardiovascular:     Rate and Rhythm: Normal rate and regular rhythm.  Pulmonary:     Breath sounds: Normal breath sounds.  Abdominal:     Tenderness: There is abdominal tenderness (left groin).  Musculoskeletal:        General: Tenderness (left hip jt, left SI joint) present.  Cervical back: Normal range of motion.  Neurological:     Mental Status: She is alert.     Diabetic Foot Exam - Simple   No data filed      Lab Results  Component Value Date   WBC 8.3 08/29/2023   HGB 11.9 08/29/2023   HCT 35.8 08/29/2023   PLT 283 08/29/2023   GLUCOSE 52 (L) 08/29/2023   CHOL 202 (H) 08/29/2023   TRIG 202 (H) 08/29/2023   HDL 48 08/29/2023   LDLCALC 119 (H) 08/29/2023   ALT 13 08/29/2023   AST 20 08/29/2023   NA 132 (L) 08/29/2023   K 4.7 08/29/2023   CL 94 (L) 08/29/2023   CREATININE 1.20 (H) 08/29/2023   BUN 21 08/29/2023   CO2 24 08/29/2023   TSH 2.110 03/02/2023   INR 1.0 06/10/2023   HGBA1C 5.6 12/12/2022      Assessment & Plan:  Assessment and Plan       Pain in joint involving left pelvic region and thigh Assessment & Plan: Chronic left hip and low back pain following a fall approximately one month ago. Pain is similar to dysmenorrhea, radiating from the groin to the back. Previous imaging includes a CT scan of the lumbar spine in January 2025 and a pelvic x-ray in May 2022 showing mild sacroiliac joint degenerative changes. Differential diagnosis includes hip joint pathology or sacroiliac joint arthritis. Pain upon lifting the left leg suggests possible hip involvement. Further imaging is required to evaluate the hip joint. - Order hip x-ray at the med center on [address]. It is reassuring that her pelvic ultrasound was normal, ovaries were not visualized which implies they might have atrophied. - Consider oral prednisone for symptom relief if  pain is severe.  Orders: -     DG HIP UNILAT W OR W/O PELVIS 2-3 VIEWS LEFT; Future  Screen for colon cancer -     Ambulatory referral to Gastroenterology  Non-seasonal allergic rhinitis due to pollen Assessment & Plan: Chronic allergic rhinitis with symptoms of epiphora and morning headaches. Steroid injections have been effective previously, but there is concern about the risks of steroid use, including osteoporosis, exacerbation of arthritis, and immunosuppression. Oral steroids are recommended if symptoms are severe, as injection steroids have a higher risk profile. - Prescribe oral prednisone, 10 tablets, take 2 daily for 5 days. - Discuss risks and benefits of steroid use, including potential for osteoporosis and immunosuppression.   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 She has not fully recovered and is concerned about recurrence. The shingles vaccine is recommended to prevent future episodes. - Advise obtaining shingles vaccine at a pharmacy.  Colorectal Cancer Screening Due for a colonoscopy as part of routine colorectal cancer screening. She is willing to schedule the procedure after managing personal commitments. - Order colonoscopy and coordinate scheduling with gastroenterology.   Other orders -     predniSONE; Take 2 tablets (40 mg total) by mouth daily with breakfast for 5 days.  Dispense: 10 tablet; Refill: 0     Meds ordered this encounter  Medications   predniSONE (DELTASONE) 20 MG tablet    Sig: Take 2 tablets (40 mg total) by mouth daily with breakfast for 5 days.    Dispense:  10 tablet    Refill:  0    Orders Placed This Encounter  Procedures   DG Hip Unilat W OR W/O Pelvis 2-3 Views Left  Ambulatory referral to Gastroenterology     Follow-up: Return if symptoms worsen or fail to improve.     An After Visit Summary was printed and given to  the patient.  Windell Moment, MD [name] [phone]

## 2023-09-28 NOTE — Assessment & Plan Note (Signed)
 Chronic left hip and low back pain following a fall approximately one month ago. Pain is similar to dysmenorrhea, radiating from the groin to the back. Previous imaging includes a CT scan of the lumbar spine in January 2025 and a pelvic x-ray in May 2022 showing mild sacroiliac joint degenerative changes. Differential diagnosis includes hip joint pathology or sacroiliac joint arthritis. Pain upon lifting the left leg suggests possible hip involvement. Further imaging is required to evaluate the hip joint. - Order hip x-ray at the med center on [address]. It is reassuring that her pelvic ultrasound was normal, ovaries were not visualized which implies they might have atrophied. - Consider oral prednisone for symptom relief if pain is severe.

## 2023-09-28 NOTE — Assessment & Plan Note (Addendum)
 Chronic allergic rhinitis with symptoms of epiphora and morning headaches. Steroid injections have been effective previously, but there is concern about the risks of steroid use, including osteoporosis, exacerbation of arthritis, and immunosuppression. Oral steroids are recommended if symptoms are severe, as injection steroids have a higher risk profile. - Prescribe oral prednisone, 10 tablets, take 2 daily for 5 days. - Discuss risks and benefits of steroid use, including potential for osteoporosis and immunosuppression.   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 She has not fully recovered and is concerned about recurrence. The shingles vaccine is recommended to prevent future episodes. - Advise obtaining shingles vaccine at a pharmacy.  Colorectal Cancer Screening Due for a colonoscopy as part of routine colorectal cancer screening. She is willing to schedule the procedure after managing personal commitments. - Order colonoscopy and coordinate scheduling with gastroenterology.

## 2023-09-28 NOTE — Patient Instructions (Addendum)
 VISIT SUMMARY:  Today, you were seen for left hip and pelvic pain that started after a fall two months ago. We discussed your ongoing issues with allergies, your history of shingles, and your upcoming colonoscopy. We reviewed your previous imaging results and planned further evaluations to better understand your hip pain.  YOUR PLAN:  -LEFT HIP AND LOW BACK PAIN: Your left hip and low back pain started after a fall and may be related to hip joint or sacroiliac joint issues. We will order a hip x-ray to get a clearer picture of what might be causing your pain. If the pain becomes severe, you can consider taking oral prednisone for relief.  -ALLERGIC RHINITIS: Allergic rhinitis is a condition where you have chronic allergy symptoms like teary eyes and headaches. We will prescribe oral prednisone to help manage your symptoms. Please take 2 tablets daily for 5 days. Be aware of the potential risks of steroid use, such as osteoporosis and immunosuppression.  -POSTHERPETIC NEURALGIA: Postherpetic neuralgia is nerve pain that can occur after having shingles. To prevent future episodes, we recommend getting the shingles vaccine at a pharmacy.  -COLORECTAL CANCER SCREENING: You are due for a colonoscopy to screen for colorectal cancer. We will help you schedule this procedure with gastroenterology.  INSTRUCTIONS:  Please go to the med center on [address] for your hip x-ray. Take the prescribed oral prednisone as directed for your allergy symptoms. Schedule your colonoscopy with gastroenterology after managing your personal commitments. Also, visit a pharmacy to get the shingles vaccine to prevent future episodes of shingles.

## 2023-10-13 ENCOUNTER — Other Ambulatory Visit: Payer: Self-pay | Admitting: Family Medicine

## 2023-10-16 ENCOUNTER — Ambulatory Visit: Payer: Self-pay

## 2023-10-16 NOTE — Telephone Encounter (Signed)
  Chief Complaint: scalp itching and bumps Symptoms: scalp severe itchiness, "knots" in scalp Frequency: itchiness since September, knots x couple of weeks Pertinent Negatives: Patient denies pus, fever, facial swelling Disposition: [] ED /[] Urgent Care (no appt availability in office) / [x] Appointment(In office/virtual)/ []  [facility] [place] [facility]/ [] Home Care/ [] Refused Recommended Disposition /[] [facility]/ []  Follow-up with PCP Additional Notes: Patient states she had shingles back in September. She states she had the shingles vaccination last Friday. She is unsure what is causing the knots/bumps in her scalp. She states she has been using her prescribed itching ointment and medication but it has not resolved. Patient requesting appt on Wednesday with PCP; scheduled.  Copied from CRM [id number]. Topic: Clinical - Red Word Triage >> Oct 16, 2023  8:34 AM Turkey B wrote: [facility] that prompted transfer to Nurse Triage: pt has itchy scalp and knots on her scalp Reason for Disposition  [1] MODERATE-SEVERE local itching (i.e., interferes with work, school, activities) AND [2] not improved after 24 hours of hydrocortisone  cream  Answer Assessment - Initial Assessment Questions 1. DESCRIPTION: "Describe the itching you are having." "Where is it located?"     She states the whole scalp is itching.  2. SEVERITY: "How bad is it?"    - MILD: Doesn't interfere with normal activities.   - MODERATE-SEVERE: Interferes with work, school, sleep, or other activities.      Severe. She states she has been using some medications for itching she has been using. She states its worse at night or when she gets upset.  3. SCRATCHING: "Are there any scratch marks? Bleeding?"     Denies.  4. ONSET: "When did the itching begin?"      8 months.  5. CAUSE: "What do you think is causing the itching?"      Unsure, she states this doesn't feel like shingles.  6. OTHER SYMPTOMS: "Do you have any other  symptoms?"      Raised "knots" on scalp.  7. PREGNANCY: "Is there any chance you are pregnant?" "When was your last menstrual period?"     N/A.  Protocols used: Itching - Localized-A-AH

## 2023-10-16 NOTE — Telephone Encounter (Signed)
 Agree. Dr. Sedalia Muta

## 2023-10-18 ENCOUNTER — Ambulatory Visit (INDEPENDENT_AMBULATORY_CARE_PROVIDER_SITE_OTHER): Admitting: Family Medicine

## 2023-10-18 VITALS — BP 130/60 | HR 87 | Temp 98.2°F | Ht 65.0 in | Wt 155.0 lb

## 2023-10-18 DIAGNOSIS — B0229 Other postherpetic nervous system involvement: Secondary | ICD-10-CM

## 2023-10-18 MED ORDER — LIDOCAINE-PRILOCAINE 2.5-2.5 % EX CREA: 1.0000 | TOPICAL_CREAM | Freq: Four times a day (QID) | CUTANEOUS | 0 refills | Status: DC | PRN

## 2023-10-18 MED ORDER — HYDROXYZINE PAMOATE 50 MG PO CAPS: 50.0000 mg | ORAL_CAPSULE | Freq: Three times a day (TID) | ORAL | 2 refills | Status: DC | PRN

## 2023-10-18 MED ORDER — COLLAGENASE 250 UNIT/GM EX OINT: 1.0000 | TOPICAL_OINTMENT | Freq: Every day | CUTANEOUS | 1 refills | Status: DC

## 2023-10-18 NOTE — Progress Notes (Signed)
 Acute Office Visit  Subjective:    Patient ID: Courtney [name], female    DOB: Jan 16, 1946, 78 y.o.   MRN: [id number]  Chief Complaint  Patient presents with   Itchy scalp    HPI: Patient is in today for itchy scalp since September (when she has shingles). Has tried multiple shampoos and states it is getting worse. Patient saw Dr. Gracie Lav, neurology, who recommended oxcarbazepine  and emla  cream. The patient did not start it because she recognized it as a medicine used for bipolar. Patient has tried and failed on amitriptyline , lyrica, and gabapentin .   Complaining of depression and anxiety. More anxiety.   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 07/25/2014   Followed in Pulmonary clinic/ [facility]/ [place]    Cardiac dysrhythmia    Chronic idiopathic constipation    Colitis, acute 02/08/2021   Coronary artery disease    Depression, major, recurrent, moderate (HCC)    Headache    Heart murmur    History of bladder infections    History of COVID-19    Hyperlipidemia    Hypertension    Hypothyroidism    Knee pain, bilateral    Melena 02/08/2021   Osteoarthritis    Pneumonia    Primary insomnia    Recovering alcoholic (HCC)    SVT (supraventricular tachycardia) (HCC)    UTI (urinary tract infection)    Varicose veins    Vitamin B12 deficiency     Past Surgical History:  Procedure Laterality Date   ABDOMINAL HYSTERECTOMY  1991   APPENDECTOMY     BACK SURGERY     had 2 surgeries. Have rods placed in back    BREAST BIOPSY Left    x2   BREAST ENHANCEMENT SURGERY  2006   CARDIAC CATHETERIZATION  12/22/2020   stents placed   CHOLECYSTECTOMY  12/2019   removed lap band.    COLONOSCOPY  12/01/2016   Moderate predominantly sigmod diverticulosis. Otherwise grossly normal colonoscopy   EYE SURGERY     IOL- bilateral - [place]   KNEE ARTHROSCOPY Left    lap band  surgery  1981   LUMBAR FUSION  07/29/2015   posterior level one   removal of cervical disc fragments  10/2007   pt. denies    TUBAL LIGATION  1970    Family History  Problem Relation Age of Onset   Heart disease Mother    Heart disease Father    Diabetes Sister    Other Sister        BRAIN TUMOR   Heart disease Brother    Heart disease Brother    Heart disease Brother    Heart disease Brother    Heart disease Brother    Colon cancer Maternal Aunt    Colon cancer Cousin     Social History   Socioeconomic History   Marital status: Widowed    Spouse name: Not on file   Number of children: 2   Years of education: Not on file   Highest education level: Not on file  Occupational History   Occupation: retired    Comment: nurse  Tobacco Use   Smoking status: Never   Smokeless tobacco: Never  Vaping Use   Vaping status: Never Used  Substance and Sexual Activity   Alcohol use: No    Alcohol/week: 0.0 standard drinks of alcohol  Comment: recovery x 20 yrs   Drug use: No   Sexual activity: Not Currently    Comment: MARRIED  Other Topics Concern   Not on file  Social History Narrative   Not on file   Social Drivers of Health   Financial Resource Strain: Low Risk  (12/12/2022)   Overall Financial Resource Strain (CARDIA)    Difficulty of Paying Living Expenses: Not hard at all  Food Insecurity: No Food Insecurity (10/18/2023)   Hunger Vital Sign    Worried About Running Out of Food in the Last Year: Never true    Ran Out of Food in the Last Year: Never true  Transportation Needs: No Transportation Needs (10/18/2023)   PRAPARE - Administrator, Civil Service (Medical): No    Lack of Transportation (Non-Medical): No  Physical Activity: Inactive (12/12/2022)   Exercise Vital Sign    Days of Exercise per Week: 0 days    Minutes of Exercise per Session: 0 min  Stress: No Stress Concern Present (12/12/2022)   [employer] of Occupational Health -  Occupational Stress Questionnaire    Feeling of Stress : Not at all  Social Connections: Moderately Isolated (12/12/2022)   Social Connection and Isolation Panel [NHANES]    Frequency of Communication with Friends and Family: Three times a week    Frequency of Social Gatherings with Friends and Family: Three times a week    Attends Religious Services: More than 4 times per year    Active Member of Clubs or Organizations: No    Attends Banker Meetings: Never    Marital Status: Widowed  Intimate Partner Violence: Not At Risk (10/18/2023)   Humiliation, Afraid, Rape, and Kick questionnaire    Fear of Current or Ex-Partner: No    Emotionally Abused: No    Physically Abused: No    Sexually Abused: No    Outpatient Medications Prior to Visit  Medication Sig Dispense Refill   albuterol  (VENTOLIN  HFA) 108 (90 Base) MCG/ACT inhaler Inhale 2 puffs into the lungs every 6 (six) hours as needed for wheezing or shortness of breath. 8 g 2   amLODipine  (NORVASC ) 5 MG tablet Take 1 tablet (5 mg total) by mouth daily. 90 tablet 0   Ascorbic Acid (VITAMIN C) 1000 MG tablet Take 1,000 mg by mouth daily.     Cholecalciferol (D3 PO) Take 2 tablets by mouth daily. chewable     DULoxetine  (CYMBALTA ) 60 MG capsule Take 1 capsule (60 mg total) by mouth 2 (two) times daily. 180 capsule 0   Fluticasone -Umeclidin-Vilant (TRELEGY ELLIPTA ) 100-62.5-25 MCG/ACT AEPB Inhale 1 puff into the lungs daily. 1 each 11   furosemide  (LASIX ) 20 MG tablet Take 20 mg by mouth daily.     levothyroxine  (SYNTHROID ) 75 MCG tablet TAKE 1 TABLET(75 MCG) BY MOUTH DAILY 90 tablet 1   lisinopril  (ZESTRIL ) 2.5 MG tablet Take 2.5 mg by mouth daily.     MAGNESIUM PO Take 1 tablet by mouth daily. chewable     metoprolol  tartrate (LOPRESSOR ) 50 MG tablet TAKE 1 TABLET(50 MG) BY MOUTH TWICE DAILY WITH MEALS 180 tablet 1   montelukast  (SINGULAIR ) 10 MG tablet Take 10 mg by mouth daily as needed (allergies).     MOUNJARO  12.5  MG/0.5ML Pen ADMINISTER 12.5 MG UNDER THE SKIN 1 TIME A WEEK FOR 16 DOSES 2 mL 3   naloxone (NARCAN) nasal spray 4 mg/0.1 mL Place 1 spray into the nose once. In case of opioid overdose.  nitroGLYCERIN  (NITROSTAT ) 0.4 MG SL tablet DISSOLVE ONE TABLET UNDER TONGUE AS NEEDED FOR CHEST PAIN EVERY 5 MINUTES FOR UP TO 3 DOSES 25 tablet 1   omeprazole  (PRILOSEC) 40 MG capsule Take 40 mg by mouth daily as needed (reflux).     ondansetron  (ZOFRAN -ODT) 8 MG disintegrating tablet Take 8 mg by mouth every 8 (eight) hours as needed for nausea.     oxyCODONE -acetaminophen  (PERCOCET) 10-325 MG tablet Take 1 tablet by mouth See admin instructions. Take 1 tablet by mouth 5 times a day. May skip a dose if not needed.     REPATHA SURECLICK 140 MG/ML SOAJ Inject 140 mg into the skin every 14 (fourteen) days.     RESTASIS 0.05 % ophthalmic emulsion Place 1 drop into both eyes 2 (two) times daily as needed (dryness).     tiZANidine  (ZANAFLEX ) 4 MG tablet Take 4 mg by mouth at bedtime as needed for muscle spasms.     tretinoin (RETIN-A) 0.025 % gel Apply 1 Application topically at bedtime.     ALPRAZolam  (XANAX ) 0.5 MG tablet TAKE 1 TABLET(0.5 MG) BY MOUTH TWICE DAILY AS NEEDED FOR ANXIETY 60 tablet 0   amitriptyline  (ELAVIL ) 10 MG tablet Take 1 tablet (10 mg total) by mouth 2 (two) times daily as needed for sleep. 60 tablet 6   ciprofloxacin  (CIPRO ) 500 MG tablet Take 500 mg by mouth 2 (two) times daily.     collagenase  (SANTYL ) 250 UNIT/GM ointment Apply 1 Application topically daily. 15 g 1   furosemide  (LASIX ) 40 MG tablet Take 1 tablet (40 mg total) by mouth daily. 30 tablet 3   gabapentin  (NEURONTIN ) 300 MG capsule Take 2 capsules (600 mg total) by mouth 3 (three) times daily. (Patient taking differently: Take 600 mg by mouth 3 (three) times daily as needed (pain).) 180 capsule 11   hydrOXYzine  (VISTARIL ) 25 MG capsule TAKE 1 CAPSULE(25 MG) BY MOUTH EVERY 6 HOURS AS NEEDED FOR ITCHING 90 capsule 1   lidocaine   (LIDODERM ) 5 % Place 1 patch onto the skin daily as needed (pain). Remove & Discard patch within 12 hours or as directed by MD     lidocaine -prilocaine  (EMLA ) cream 1 gram qid prn (Patient taking differently: Apply 1 Application topically 4 (four) times daily as needed (pain).) 30 g 6   meloxicam (MOBIC) 15 MG tablet Take 15 mg by mouth daily as needed for pain.     OXcarbazepine  (TRILEPTAL ) 150 MG tablet Take 1 tablet (150 mg total) by mouth 2 (two) times daily. 60 tablet 11   phenazopyridine  (PYRIDIUM ) 200 MG tablet Take 200 mg by mouth every 8 (eight) hours as needed.     tirzepatide  (MOUNJARO ) 10 MG/0.5ML Pen Inject 10 mg into the skin once a week. 6 mL 0   valACYclovir  (VALTREX ) 1000 MG tablet Take 1 tablet (1,000 mg total) by mouth daily. 90 tablet 0   No facility-administered medications prior to visit.    Allergies  Allergen Reactions   Levofloxacin Hives, Shortness Of Breath, Swelling and Other (See Comments)    Tolerates Ciprofloxacin  RASH IN MOUTH   (can take IV route)   Augmentin  [Amoxicillin -Pot Clavulanate] Nausea Only   Bactrim  [Sulfamethoxazole -Trimethoprim ] Hives and Itching   Clarithromycin      GI upset Other reaction(s): GI Upset (intolerance) GI upset -can take but does cause mild upset    Lyrica [Pregabalin]    Nexletol [Bempedoic Acid]     Muscle pain   Statins Other (See Comments)    Myalgias  Zetia  [Ezetimibe ]     Muscle pain    Review of Systems  Skin:        Itchy scalp        Objective:        10/18/2023   10:46 AM 09/28/2023   10:56 AM 09/11/2023   11:26 AM  Vitals with BMI  Height 5\' 5"  5\' 5"    Weight 155 lbs 157 lbs 6 oz   BMI 25.79 26.19   Systolic 130 116 784  Diastolic 60 72 84  Pulse 87 81 79    No data found.   Physical Exam Vitals reviewed.  Constitutional:      Appearance: Normal appearance.  Cardiovascular:     Rate and Rhythm: Normal rate and regular rhythm.     Heart sounds: Normal heart sounds.  Pulmonary:      Effort: Pulmonary effort is normal.     Breath sounds: Normal breath sounds.  Skin:    Findings: Lesion (scabs x 2 on left side of scalp.) present.  Neurological:     Mental Status: She is alert.     Health Maintenance Due  Topic Date Due   Colonoscopy  12/01/2021   Medicare Annual Wellness (AWV)  04/29/2023    There are no preventive care reminders to display for this patient.   Lab Results  Component Value Date   TSH 2.110 03/02/2023   Lab Results  Component Value Date   WBC 8.3 08/29/2023   HGB 11.9 08/29/2023   HCT 35.8 08/29/2023   MCV 95 08/29/2023   PLT 283 08/29/2023   Lab Results  Component Value Date   NA 132 (L) 08/29/2023   K 4.7 08/29/2023   CO2 24 08/29/2023   GLUCOSE 52 (L) 08/29/2023   BUN 21 08/29/2023   CREATININE 1.20 (H) 08/29/2023   BILITOT <0.2 08/29/2023   ALKPHOS 154 (H) 08/29/2023   AST 20 08/29/2023   ALT 13 08/29/2023   PROT 7.1 08/29/2023   ALBUMIN 4.2 08/29/2023   CALCIUM 9.5 08/29/2023   ANIONGAP 11 06/11/2023   EGFR 47 (L) 08/29/2023   Lab Results  Component Value Date   CHOL 202 (H) 08/29/2023   Lab Results  Component Value Date   HDL 48 08/29/2023   Lab Results  Component Value Date   LDLCALC 119 (H) 08/29/2023   Lab Results  Component Value Date   TRIG 202 (H) 08/29/2023   Lab Results  Component Value Date   CHOLHDL 4.2 08/29/2023   Lab Results  Component Value Date   HGBA1C 5.6 12/12/2022       Assessment & Plan:  Postherpetic neuralgia Assessment & Plan: Only itching.  Increase hydroxyzine  50 mg three times a day.  Education given on the use of oxycarbazepine for PHN. Often these antieleptic medicines are used for bipolar, chronic neurological pain. Patient expressed understanding. Represcribed emla  cream. Also increased the hydroxyzine  and recommend take scheduled dosing.   Orders: -     Collagenase ; Apply 1 Application topically daily.  Dispense: 15 g; Refill: 1 -     Lidocaine -Prilocaine ; Apply 1  Application topically 4 (four) times daily as needed (pain).  Dispense: 30 g; Refill: 0 -     hydrOXYzine  Pamoate; Take 1 capsule (50 mg total) by mouth 3 (three) times daily as needed.  Dispense: 90 capsule; Refill: 2     Meds ordered this encounter  Medications   collagenase  (SANTYL ) 250 UNIT/GM ointment    Sig: Apply 1 Application topically daily.  Dispense:  15 g    Refill:  1   lidocaine -prilocaine  (EMLA ) cream    Sig: Apply 1 Application topically 4 (four) times daily as needed (pain).    Dispense:  30 g    Refill:  0   hydrOXYzine  (VISTARIL ) 50 MG capsule    Sig: Take 1 capsule (50 mg total) by mouth 3 (three) times daily as needed.    Dispense:  90 capsule    Refill:  2    No orders of the defined types were placed in this encounter.    Follow-up: Return in about 8 weeks (around 12/11/2023) for chronic follow up.  An After Visit Summary was printed and given to the patient.  Mercy Stall, MD [name] [phone]

## 2023-10-18 NOTE — Patient Instructions (Signed)
 Increase hydroxyzine  50 mg three times a day.

## 2023-10-22 ENCOUNTER — Encounter: Payer: Self-pay | Admitting: Family Medicine

## 2023-10-22 DIAGNOSIS — B0229 Other postherpetic nervous system involvement: Secondary | ICD-10-CM | POA: Insufficient documentation

## 2023-10-22 DIAGNOSIS — B0222 Postherpetic trigeminal neuralgia: Secondary | ICD-10-CM | POA: Insufficient documentation

## 2023-10-22 NOTE — Assessment & Plan Note (Addendum)
 Only itching.  Increase hydroxyzine  50 mg three times a day.  Education given on the use of oxycarbazepine for PHN. Often these antieleptic medicines are used for bipolar, chronic neurological pain. Patient expressed understanding. Represcribed emla  cream. Also increased the hydroxyzine  and recommend take scheduled dosing.

## 2023-10-27 DIAGNOSIS — G894 Chronic pain syndrome: Secondary | ICD-10-CM | POA: Diagnosis not present

## 2023-10-27 DIAGNOSIS — M533 Sacrococcygeal disorders, not elsewhere classified: Secondary | ICD-10-CM | POA: Diagnosis not present

## 2023-10-27 DIAGNOSIS — Z79899 Other long term (current) drug therapy: Secondary | ICD-10-CM | POA: Diagnosis not present

## 2023-10-27 DIAGNOSIS — M961 Postlaminectomy syndrome, not elsewhere classified: Secondary | ICD-10-CM | POA: Diagnosis not present

## 2023-10-28 DIAGNOSIS — N3000 Acute cystitis without hematuria: Secondary | ICD-10-CM | POA: Diagnosis not present

## 2023-10-28 DIAGNOSIS — N3090 Cystitis, unspecified without hematuria: Secondary | ICD-10-CM | POA: Diagnosis not present

## 2023-10-31 DIAGNOSIS — M4804 Spinal stenosis, thoracic region: Secondary | ICD-10-CM | POA: Diagnosis not present

## 2023-10-31 DIAGNOSIS — M5124 Other intervertebral disc displacement, thoracic region: Secondary | ICD-10-CM | POA: Diagnosis not present

## 2023-10-31 DIAGNOSIS — M5135 Other intervertebral disc degeneration, thoracolumbar region: Secondary | ICD-10-CM | POA: Diagnosis not present

## 2023-10-31 DIAGNOSIS — M5134 Other intervertebral disc degeneration, thoracic region: Secondary | ICD-10-CM | POA: Diagnosis not present

## 2023-10-31 DIAGNOSIS — M961 Postlaminectomy syndrome, not elsewhere classified: Secondary | ICD-10-CM | POA: Diagnosis not present

## 2023-11-01 DIAGNOSIS — R1032 Left lower quadrant pain: Secondary | ICD-10-CM | POA: Diagnosis not present

## 2023-11-06 DIAGNOSIS — L209 Atopic dermatitis, unspecified: Secondary | ICD-10-CM | POA: Diagnosis not present

## 2023-11-06 DIAGNOSIS — L219 Seborrheic dermatitis, unspecified: Secondary | ICD-10-CM | POA: Diagnosis not present

## 2023-11-08 ENCOUNTER — Telehealth: Payer: Self-pay

## 2023-11-08 NOTE — Telephone Encounter (Signed)
 Copied from CRM [id number]. Topic: Clinical - Medication Question >> Nov 08, 2023  1:28 PM Ivette P wrote: Reason for CRM: pt daughter sherry called in to see if she can get home health Physical therapy for the pt.   Having a hard time at home.    Call pt on house phone

## 2023-11-09 ENCOUNTER — Other Ambulatory Visit: Payer: Self-pay

## 2023-11-09 DIAGNOSIS — Z79899 Other long term (current) drug therapy: Secondary | ICD-10-CM | POA: Diagnosis not present

## 2023-11-09 DIAGNOSIS — M6281 Muscle weakness (generalized): Secondary | ICD-10-CM

## 2023-11-13 ENCOUNTER — Other Ambulatory Visit: Payer: Self-pay

## 2023-11-13 ENCOUNTER — Encounter (HOSPITAL_BASED_OUTPATIENT_CLINIC_OR_DEPARTMENT_OTHER): Payer: Self-pay

## 2023-11-13 ENCOUNTER — Emergency Department (HOSPITAL_BASED_OUTPATIENT_CLINIC_OR_DEPARTMENT_OTHER)
Admission: EM | Admit: 2023-11-13 | Discharge: 2023-11-13 | Disposition: A | Discharge status: Home / Self Care | Attending: Emergency Medicine | Admitting: Emergency Medicine

## 2023-11-13 DIAGNOSIS — I10 Essential (primary) hypertension: Secondary | ICD-10-CM | POA: Insufficient documentation

## 2023-11-13 DIAGNOSIS — E039 Hypothyroidism, unspecified: Secondary | ICD-10-CM | POA: Insufficient documentation

## 2023-11-13 DIAGNOSIS — R21 Rash and other nonspecific skin eruption: Secondary | ICD-10-CM | POA: Insufficient documentation

## 2023-11-13 DIAGNOSIS — Z79899 Other long term (current) drug therapy: Secondary | ICD-10-CM | POA: Diagnosis not present

## 2023-11-13 MED ORDER — TRIAMCINOLONE ACETONIDE 40 MG/ML IJ SUSP
40.0000 mg | Freq: Once | INTRAMUSCULAR | Status: AC
  Administered 2023-11-13: 40 mg via INTRAMUSCULAR
  Filled 2023-11-13: qty 5

## 2023-11-13 MED ORDER — LIDOCAINE 5 % EX OINT: 1.0000 | TOPICAL_OINTMENT | CUTANEOUS | 0 refills | Status: DC | PRN

## 2023-11-13 NOTE — ED Notes (Signed)
 Patient educated on wait time post IM injection. Patient chose not to wait.

## 2023-11-13 NOTE — ED Provider Notes (Signed)
 [place] EMERGENCY DEPARTMENT AT [facility] Provider Note   CSN: [id number] Arrival date & time: 11/13/23  1034     History  Chief Complaint  Patient presents with   Rash    Courtney [name] is a 78 y.o. female.   Rash   78 year old female presents to ED with complaints of rash.  Patient reports rash to the left forehead, left scalp.  Has been present since September 2024.  States that she developed shingles rash has been dealing with pain, itching sensation ever since then.  Has been on several different antihistamine agents, antiviral medication, topical corticosteroids, topical numbing agents, topical Benadryl  but still with continued symptoms.  States that she benefited most from the topical corticosteroids but has been on this for a week and has been noticing thinning of her skin of the left forehead area.  States that the areas of rash have healed but she notes 3-4 areas on the left forehead and left scalp but still are in the process of healing.  States that she has been itching these areas still.  PMHx significant for htn, bronchiectasis, chronic constipation, cap, hypothyroidism, hld, chronic pain, svt  Home Medications Prior to Admission medications   Medication Sig Start Date End Date Taking? Authorizing Provider  albuterol  (VENTOLIN  HFA) 108 (90 Base) MCG/ACT inhaler Inhale 2 puffs into the lungs every 6 (six) hours as needed for wheezing or shortness of breath. 07/14/23   Janece Means, FNP  amLODipine  (NORVASC ) 5 MG tablet Take 1 tablet (5 mg total) by mouth daily. 06/15/23   CoxBurleigh Carp, MD  Ascorbic Acid (VITAMIN C) 1000 MG tablet Take 1,000 mg by mouth daily.    [provider]  Cholecalciferol (D3 PO) Take 2 tablets by mouth daily. chewable    [provider]  collagenase  (SANTYL ) 250 UNIT/GM ointment Apply 1 Application topically daily. 10/18/23   CoxBurleigh Carp, MD  DULoxetine  (CYMBALTA ) 60 MG capsule Take 1 capsule (60 mg total) by mouth  2 (two) times daily. 08/29/23 11/27/23  Sirivol, Mamatha, MD  Fluticasone -Umeclidin-Vilant (TRELEGY ELLIPTA ) 100-62.5-25 MCG/ACT AEPB Inhale 1 puff into the lungs daily. 07/28/23   Sirivol, Mamatha, MD  furosemide  (LASIX ) 20 MG tablet Take 20 mg by mouth daily.    [provider]  hydrOXYzine  (VISTARIL ) 50 MG capsule Take 1 capsule (50 mg total) by mouth 3 (three) times daily as needed. 10/18/23   CoxBurleigh Carp, MD  levothyroxine  (SYNTHROID ) 75 MCG tablet TAKE 1 TABLET(75 MCG) BY MOUTH DAILY 05/22/23   [name], Burleigh Carp, MD  lidocaine -prilocaine  (EMLA ) cream Apply 1 Application topically 4 (four) times daily as needed (pain). 10/18/23   CoxBurleigh Carp, MD  lisinopril  (ZESTRIL ) 2.5 MG tablet Take 2.5 mg by mouth daily. 04/06/23   [provider]  MAGNESIUM PO Take 1 tablet by mouth daily. chewable    [provider]  metoprolol  tartrate (LOPRESSOR ) 50 MG tablet TAKE 1 TABLET(50 MG) BY MOUTH TWICE DAILY WITH MEALS 05/22/23   [name], Kirsten, MD  montelukast  (SINGULAIR ) 10 MG tablet Take 10 mg by mouth daily as needed (allergies).    [provider]  MOUNJARO  12.5 MG/0.5ML Pen ADMINISTER 12.5 MG UNDER THE SKIN 1 TIME A WEEK FOR 16 DOSES 08/14/23   Janece Means, FNP  naloxone [facility]) nasal spray 4 mg/0.1 mL Place 1 spray into the nose once. In case of opioid overdose.    [provider]  nitroGLYCERIN  (NITROSTAT ) 0.4 MG SL tablet DISSOLVE ONE TABLET UNDER TONGUE AS  NEEDED FOR CHEST PAIN EVERY 5 MINUTES FOR UP TO 3 DOSES 02/24/23   Sirivol, Alla Ar, MD  omeprazole  (PRILOSEC) 40 MG capsule Take 40 mg by mouth daily as needed (reflux).    [provider]  ondansetron  (ZOFRAN -ODT) 8 MG disintegrating tablet Take 8 mg by mouth every 8 (eight) hours as needed for nausea. 06/09/23   [provider]  oxyCODONE -acetaminophen  (PERCOCET) 10-325 MG tablet Take 1 tablet by mouth See admin instructions. Take 1 tablet by mouth 5 times a day. May skip a dose if not needed.     [provider]  REPATHA SURECLICK 140 MG/ML SOAJ Inject 140 mg into the skin every 14 (fourteen) days.    [provider]  RESTASIS 0.05 % ophthalmic emulsion Place 1 drop into both eyes 2 (two) times daily as needed (dryness). 01/09/23   [provider]  tiZANidine  (ZANAFLEX ) 4 MG tablet Take 4 mg by mouth at bedtime as needed for muscle spasms. 06/07/23   [provider]  tretinoin (RETIN-A) 0.025 % gel Apply 1 Application topically at bedtime.    [provider]      Allergies    Levofloxacin, Augmentin  [amoxicillin -pot clavulanate], Bactrim  [sulfamethoxazole -trimethoprim ], Clarithromycin , Lyrica [pregabalin], Nexletol [bempedoic acid], Statins, and Zetia  [ezetimibe ]    Review of Systems   Review of Systems  Skin:  Positive for rash.  All other systems reviewed and are negative.   Physical Exam Updated Vital Signs BP 125/78 (BP Location: Right Arm)   Pulse 92   Temp 97.9 F (36.6 C) (Oral)   Resp 18   Ht 5\' 5"  (1.651 m)   Wt 68 kg   SpO2 98%   BMI 24.96 kg/m  Physical Exam Vitals and nursing note reviewed.  Constitutional:      General: She is not in acute distress.    Appearance: She is well-developed.  HENT:     Head: Normocephalic and atraumatic.  Eyes:     Conjunctiva/sclera: Conjunctivae normal.  Cardiovascular:     Rate and Rhythm: Normal rate and regular rhythm.     Heart sounds: No murmur heard. Pulmonary:     Effort: Pulmonary effort is normal. No respiratory distress.     Breath sounds: Normal breath sounds.  Abdominal:     Palpations: Abdomen is soft.     Tenderness: There is no abdominal tenderness.  Musculoskeletal:        General: No swelling.     Cervical back: Neck supple.  Skin:    General: Skin is warm and dry.     Capillary Refill: Capillary refill takes less than 2 seconds.     Findings: Rash present.     Comments: Patient with ulcerative area left forehead x 2.  Scabbed over plaque-like area left  of midline scalp parietal region.  No surrounding erythema, fluctuance/induration.  No palpable lump.  No expressible drainage.  Neurological:     Mental Status: She is alert.  Psychiatric:        Mood and Affect: Mood normal.     ED Results / Procedures / Treatments   Labs (all labs ordered are listed, but only abnormal results are displayed) Labs Reviewed - No data to display  EKG None  Radiology No results found.  Procedures Procedures    Medications Ordered in ED Medications - No data to display  ED Course/ Medical Decision Making/ A&P  Medical Decision Making Risk Prescription drug management.   This patient presents to the ED for concern of rash, this involves an extensive number of treatment options, and is a complaint that carries with it a high risk of complications and morbidity.  The differential diagnosis includes HSV, HCV, cellulitis, erysipelas, negative infection, contact dermatitis, postherpetic neuralgia, other   Co morbidities that complicate the patient evaluation  See HPI   Additional history obtained:  Additional history obtained from EMR External records from outside source obtained and reviewed including hospital records   Lab Tests:  N/a   Imaging Studies ordered:  N/a   Cardiac Monitoring: / EKG:  N/a   Consultations Obtained:  N/a   Problem List / ED Course / Critical interventions / Medication management  Rash I ordered medication including kenalog    Reevaluation of the patient after these medicines showed that the patient stayed the same I have reviewed the patients home medicines and have made adjustments as needed   Social Determinants of Health:  Denies tobacco,'s or drug use.   Test / Admission - Considered:  Rash Vitals signs within normal range and stable throughout visit. 78 year old female presents to ED with complaints of rash.  Patient reports rash to the left  forehead, left scalp.  Has been present since September 2024.  States that she developed shingles rash has been dealing with pain, itching sensation ever since then.  Has been on several different antihistamine agents, antiviral medication, topical corticosteroids, topical numbing agents, topical Benadryl  but still with continued symptoms.  States that she benefited most from the topical corticosteroids but has been on this for a week and has been noticing thinning of her skin of the left forehead area.  States that the areas of rash have healed but she notes 3-4 areas on the left forehead and left scalp but still are in the process of healing.  States that she has been itching these areas still. On exam, the area of rash left forehead as well as left midline of scalp as above.  3-4 areas.  No secondary skin changes concerning for secondary bacterial infectious cause.  Rash reported in the exact same area where her shingles rash initially began in September.  Excoriations present and patient seems to have been picking at rash developing shallow ulcerative wounds with scabs overlying.  Patient did report improvement with topical corticosteroids but has been on this for about a week with appreciable thinning of skin especially left forehead area.  Would recommend cessation of topical corticosteroid for the time being.  Patient requesting trial of IM injection of corticosteroid given her improvement with topical corticosteroid agent as she has been on several different oral, topical medications without significant improvement/resolution of symptoms.  Will trial IM dose of corticosteroid, recommend topical antihistamine as well as continued oral antihistamine as prescribed by outpatient provider.  Treatment plan discussed with patient and she acknowledged understanding was agreeable to set plan.  Patient over well-appearing, afebrile in no acute distress. Worrisome signs and symptoms were discussed with the patient,  and the patient acknowledged understanding to return to the ED if noticed. Patient was stable upon discharge.          Final Clinical Impression(s) / ED Diagnoses Final diagnoses:  None    Rx / DC Orders ED Discharge Orders     None         [place] Butter, [place] 11/13/23 1241    Afton Horse T, DO 11/13/23 1447

## 2023-11-13 NOTE — ED Triage Notes (Signed)
 Pt reports having shingles in September of last year. Pt reports currently having L sided scalp/face itching since then. Pt reports going to PCP and dermatologist with no relief.

## 2023-11-13 NOTE — Discharge Instructions (Signed)
 As discussed, recommend stop using the steroid ointment/cream as it seems to be thinning your skin on your forehead.  You may repeat steroid cream in about a week but please not use for more than 7 days.  Continue to take hydroxyzine  as needed for itching sensation.  Will prescribe lidocaine  ointment to place over area.  Recommend close follow-up with primary care for reassessment.  Please do not hesitate to return if the worrisome signs and symptoms we discussed become apparent.

## 2023-11-16 ENCOUNTER — Telehealth: Payer: Self-pay | Admitting: Family Medicine

## 2023-11-16 DIAGNOSIS — M6281 Muscle weakness (generalized): Secondary | ICD-10-CM | POA: Diagnosis not present

## 2023-11-16 NOTE — Telephone Encounter (Signed)
 [facility] Plan of Care report for 11/16/23.

## 2023-11-22 ENCOUNTER — Encounter: Payer: Self-pay | Admitting: Family Medicine

## 2023-11-22 ENCOUNTER — Ambulatory Visit: Admitting: Family Medicine

## 2023-11-22 ENCOUNTER — Ambulatory Visit: Payer: Self-pay

## 2023-11-22 VITALS — BP 130/72 | HR 69 | Temp 97.8°F | Resp 18 | Ht 65.0 in | Wt 156.6 lb

## 2023-11-22 DIAGNOSIS — F411 Generalized anxiety disorder: Secondary | ICD-10-CM

## 2023-11-22 DIAGNOSIS — R300 Dysuria: Secondary | ICD-10-CM | POA: Diagnosis not present

## 2023-11-22 DIAGNOSIS — B0229 Other postherpetic nervous system involvement: Secondary | ICD-10-CM

## 2023-11-22 DIAGNOSIS — N3000 Acute cystitis without hematuria: Secondary | ICD-10-CM | POA: Diagnosis not present

## 2023-11-22 DIAGNOSIS — G4709 Other insomnia: Secondary | ICD-10-CM | POA: Insufficient documentation

## 2023-11-22 LAB — POCT URINALYSIS DIP (CLINITEK)
Blood, UA: NEGATIVE
Glucose, UA: NEGATIVE mg/dL
Ketones, POC UA: NEGATIVE mg/dL
Nitrite, UA: POSITIVE — AB
POC PROTEIN,UA: NEGATIVE
Spec Grav, UA: <=1.005 — AB (ref 1.010–1.025)
Urobilinogen, UA: >=8 U/dL — AB
pH, UA: 7 (ref 5.0–8.0)

## 2023-11-22 MED ORDER — CIPROFLOXACIN HCL 500 MG PO TABS
500.0000 mg | ORAL_TABLET | Freq: Two times a day (BID) | ORAL | 0 refills | Status: AC
Start: 2023-11-22 — End: 2023-11-29

## 2023-11-22 MED ORDER — PHENAZOPYRIDINE HCL 100 MG PO TABS: 100.0000 mg | ORAL_TABLET | Freq: Three times a day (TID) | ORAL | 0 refills | Status: DC | PRN

## 2023-11-22 MED ORDER — ALPRAZOLAM 0.25 MG PO TABS: 0.2500 mg | ORAL_TABLET | Freq: Every evening | ORAL | 0 refills | Status: DC | PRN

## 2023-11-22 NOTE — Assessment & Plan Note (Signed)
 Recurrent UTI with frequency, urgency, and bilateral flank pain. Ciprofloxacin  effective previously. Allergy to levofloxacin. - Send urine for culture. - Prescribe ciprofloxacin  500 mg twice daily for 7 days. - Advise to increase fluid intake. - Instruct not to take tizanidine  while on ciprofloxacin .

## 2023-11-22 NOTE — Assessment & Plan Note (Signed)
 Anxiety and Insomnia Increased anxiety and insomnia due to life stressors. Xanax  previously effective. Hydroxyzine  ineffective. Discussed benzodiazepine risks with pain medications. - Prescribe Xanax  0.5 mg, 10 tablets, with caution about overdose risk with pain medications. - Instruct to complete psychiatric medication form. - Advise follow-up for anxiety and insomnia management.

## 2023-11-22 NOTE — Assessment & Plan Note (Signed)
 Persistent lesions and pain since shingles in September, located on the left forehead, scalp, and left forearm. Lesions are not healing well and cause severe itching. Hydroxyzine  ineffective. - Consider alternative treatments like cryotherapy as discussed previosuly

## 2023-11-22 NOTE — Assessment & Plan Note (Addendum)
 Increased anxiety and insomnia due to life stressors. Xanax  previously effective. Hydroxyzine  ineffective. Discussed benzodiazepine risks with pain medications. - Prescribe Xanax  0.5 mg, 10 tablets, with caution about overdose risk with pain medications, consulted with PCP.    11/22/2023    4:16 PM 10/18/2023   11:42 AM 12/12/2022    3:30 PM 07/06/2022    9:43 AM  GAD 7 : Generalized Anxiety Score  Nervous, Anxious, on Edge 3 3 1 3   Control/stop worrying 3 3 1 3   Worry too much - different things 3 2 1 3   Trouble relaxing 3 3 1 3   Restless 3 3 0 3  Easily annoyed or irritable 3 3 0 3  Afraid - awful might happen 3 1 0 3  Total GAD 7 Score 21 18 4 21   Anxiety Difficulty Somewhat difficult Somewhat difficult Somewhat difficult Extremely difficult    - Advise follow-up for anxiety management and UDS - Continue Prozac  as prescribed

## 2023-11-22 NOTE — Telephone Encounter (Signed)
 FYI Only or Action Required?: FYI only for provider  Patient was last seen in primary care on 10/18/2023 by Mercy Stall, MD. Called Nurse Triage reporting Dysuria. Symptoms began yesterday. Interventions attempted: azo. Symptoms are: gradually worsening.  Triage Disposition: See Physician Within 24 Hours  Patient/caregiver understands and will follow disposition?: Yes               Copied from CRM [id number]. Topic: Clinical - Red Word Triage >> Nov 22, 2023  2:18 PM Fonda T wrote: Red Word that prompted transfer to Nurse Triage: Burning and pain with urination. Reason for Disposition  Urinating more frequently than usual (i.e., frequency)  Answer Assessment - Initial Assessment Questions 1. SYMPTOM: "What's the main symptom you're concerned about?" (e.g., frequency, incontinence)     Burning and pain with urination 2. ONSET: "When did the dysuria start?"     Last night 3. PAIN: "Is there any pain?" If Yes, ask: "How bad is it?" (Scale: 1-10; mild, moderate, severe)     8/10 4. CAUSE: "What do you think is causing the symptoms?"     Hx of frequent UTI 5. OTHER SYMPTOMS: "Do you have any other symptoms?" (e.g., blood in urine, fever, flank pain, pain with urination)     Frequency  Denies hematuria. Denies N/V. Denies fever. Endorses lower back pain, chronic.  Protocols used: Urinary Symptoms-A-AH

## 2023-11-22 NOTE — Progress Notes (Signed)
 Acute Office Visit  Subjective:    Patient ID: Courtney [name], female    DOB: 1946/03/24, 78 y.o.   MRN: [id number]  Chief Complaint  Patient presents with   Dysuria   Anxiety    Discussed the use of AI scribe software for clinical note transcription with the patient, who gave verbal consent to proceed.  History of Present Illness   Courtney [name] is a 78 year old female who presents with a urinary tract infection and requests refill on medication for anxiety.  She experiences increased urinary frequency, urgency, and bilateral flank pain. No fever or chills. She has a history of recurrent urinary tract infections and has previously been treated with ciprofloxacin  500 mg twice daily for seven days, which she states is the only medication that helps. She has also used Azo and Pyridium  for symptom relief.  She is experiencing significant anxiety and insomnia, which she attributes to the stress of her husband's passing nearly a year ago and other personal stressors, including the recent death of her dog due to rabies. She describes feeling 'nervous and anxious' and has been using Xanax  in the past, which was prescribed to her following her husband's death. She reports not having had a refill this year and wants it to manage her symptoms. She also mentions using hydroxyzine  for itching related to shingles, but finds it minimally effective.  [place] to [place] dog contracting rabies, resulting in the dog's death. This event has contributed to her current stress and anxiety levels.      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 07/25/2014   Followed in Pulmonary clinic/ [facility] [place]    Cardiac dysrhythmia    Chronic idiopathic constipation    Colitis, acute 02/08/2021   Coronary artery disease    Depression,  major, recurrent, moderate (HCC)    Headache    Heart murmur    History of bladder infections    History of COVID-19    Hyperlipidemia    Hypertension    Hypothyroidism    Knee pain, bilateral    Melena 02/08/2021   Osteoarthritis    Pneumonia    Primary insomnia    Recovering alcoholic (HCC)    SVT (supraventricular tachycardia) (HCC)    UTI (urinary tract infection)    Varicose veins    Vitamin B12 deficiency     Past Surgical History:  Procedure Laterality Date   ABDOMINAL HYSTERECTOMY  1991   APPENDECTOMY     BACK SURGERY     had 2 surgeries. Have rods placed in back    BREAST BIOPSY Left    x2   BREAST ENHANCEMENT SURGERY  2006   CARDIAC CATHETERIZATION  12/22/2020   stents placed   CHOLECYSTECTOMY  12/2019   removed lap band.    COLONOSCOPY  12/01/2016   Moderate predominantly sigmod diverticulosis. Otherwise grossly normal colonoscopy   EYE SURGERY     IOL- bilateral - [place]   KNEE ARTHROSCOPY Left    lap band surgery  1981   LUMBAR FUSION  07/29/2015   posterior level one   removal of cervical disc fragments  10/2007   pt. denies    TUBAL LIGATION  1970    Family History  Problem Relation Age of Onset   Heart disease  Mother    Heart disease Father    Diabetes Sister    Other Sister        BRAIN TUMOR   Heart disease Brother    Heart disease Brother    Heart disease Brother    Heart disease Brother    Heart disease Brother    Colon cancer Maternal Aunt    Colon cancer Cousin     Social History   Socioeconomic History   Marital status: Widowed    Spouse name: Not on file   Number of children: 2   Years of education: Not on file   Highest education level: Not on file  Occupational History   Occupation: retired    Comment: nurse  Tobacco Use   Smoking status: Never   Smokeless tobacco: Never  Vaping Use   Vaping status: Never Used  Substance and Sexual Activity   Alcohol use: No    Alcohol/week: 0.0 standard drinks of alcohol     Comment: recovery x 20 yrs   Drug use: No   Sexual activity: Not Currently    Comment: MARRIED  Other Topics Concern   Not on file  Social History Narrative   Not on file   Social Drivers of Health   Financial Resource Strain: Low Risk  (12/12/2022)   Overall Financial Resource Strain (CARDIA)    Difficulty of Paying Living Expenses: Not hard at all  Food Insecurity: No Food Insecurity (10/18/2023)   Hunger Vital Sign    Worried About Running Out of Food in the Last Year: Never true    Ran Out of Food in the Last Year: Never true  Transportation Needs: No Transportation Needs (10/18/2023)   PRAPARE - Administrator, Civil Service (Medical): No    Lack of Transportation (Non-Medical): No  Physical Activity: Inactive (12/12/2022)   Exercise Vital Sign    Days of Exercise per Week: 0 days    Minutes of Exercise per Session: 0 min  Stress: No Stress Concern Present (12/12/2022)   [employer] of Occupational Health - Occupational Stress Questionnaire    Feeling of Stress : Not at all  Social Connections: Moderately Isolated (12/12/2022)   Social Connection and Isolation Panel [NHANES]    Frequency of Communication with Friends and Family: Three times a week    Frequency of Social Gatherings with Friends and Family: Three times a week    Attends Religious Services: More than 4 times per year    Active Member of Clubs or Organizations: No    Attends Banker Meetings: Never    Marital Status: Widowed  Intimate Partner Violence: Not At Risk (10/18/2023)   Humiliation, Afraid, Rape, and Kick questionnaire    Fear of Current or Ex-Partner: No    Emotionally Abused: No    Physically Abused: No    Sexually Abused: No    Outpatient Medications Prior to Visit  Medication Sig Dispense Refill   albuterol  (VENTOLIN  HFA) 108 (90 Base) MCG/ACT inhaler Inhale 2 puffs into the lungs every 6 (six) hours as needed for wheezing or shortness of breath. 8 g 2   amLODipine   (NORVASC ) 5 MG tablet Take 1 tablet (5 mg total) by mouth daily. 90 tablet 0   Ascorbic Acid (VITAMIN C) 1000 MG tablet Take 1,000 mg by mouth daily.     Cholecalciferol (D3 PO) Take 2 tablets by mouth daily. chewable     collagenase  (SANTYL ) 250 UNIT/GM ointment Apply 1 Application topically daily. 15  g 1   DULoxetine  (CYMBALTA ) 60 MG capsule Take 1 capsule (60 mg total) by mouth 2 (two) times daily. 180 capsule 0   Fluticasone -Umeclidin-Vilant (TRELEGY ELLIPTA ) 100-62.5-25 MCG/ACT AEPB Inhale 1 puff into the lungs daily. 1 each 11   furosemide  (LASIX ) 20 MG tablet Take 20 mg by mouth daily.     hydrOXYzine  (VISTARIL ) 50 MG capsule Take 1 capsule (50 mg total) by mouth 3 (three) times daily as needed. 90 capsule 2   levothyroxine  (SYNTHROID ) 75 MCG tablet TAKE 1 TABLET(75 MCG) BY MOUTH DAILY 90 tablet 1   lidocaine  (XYLOCAINE ) 5 % ointment Apply 1 Application topically as needed. 35.44 g 0   lidocaine -prilocaine  (EMLA ) cream Apply 1 Application topically 4 (four) times daily as needed (pain). 30 g 0   lisinopril  (ZESTRIL ) 2.5 MG tablet Take 2.5 mg by mouth daily.     MAGNESIUM PO Take 1 tablet by mouth daily. chewable     metoprolol  tartrate (LOPRESSOR ) 50 MG tablet TAKE 1 TABLET(50 MG) BY MOUTH TWICE DAILY WITH MEALS 180 tablet 1   montelukast  (SINGULAIR ) 10 MG tablet Take 10 mg by mouth daily as needed (allergies).     MOUNJARO  12.5 MG/0.5ML Pen ADMINISTER 12.5 MG UNDER THE SKIN 1 TIME A WEEK FOR 16 DOSES 2 mL 3   naloxone (NARCAN) nasal spray 4 mg/0.1 mL Place 1 spray into the nose once. In case of opioid overdose.     nitroGLYCERIN  (NITROSTAT ) 0.4 MG SL tablet DISSOLVE ONE TABLET UNDER TONGUE AS NEEDED FOR CHEST PAIN EVERY 5 MINUTES FOR UP TO 3 DOSES 25 tablet 1   omeprazole  (PRILOSEC) 40 MG capsule Take 40 mg by mouth daily as needed (reflux).     ondansetron  (ZOFRAN -ODT) 8 MG disintegrating tablet Take 8 mg by mouth every 8 (eight) hours as needed for nausea.     oxyCODONE -acetaminophen   (PERCOCET) 10-325 MG tablet Take 1 tablet by mouth See admin instructions. Take 1 tablet by mouth 5 times a day. May skip a dose if not needed.     REPATHA SURECLICK 140 MG/ML SOAJ Inject 140 mg into the skin every 14 (fourteen) days.     RESTASIS 0.05 % ophthalmic emulsion Place 1 drop into both eyes 2 (two) times daily as needed (dryness).     tiZANidine  (ZANAFLEX ) 4 MG tablet Take 4 mg by mouth at bedtime as needed for muscle spasms.     tretinoin (RETIN-A) 0.025 % gel Apply 1 Application topically at bedtime.     No facility-administered medications prior to visit.    Allergies  Allergen Reactions   Levofloxacin Hives, Shortness Of Breath, Swelling and Other (See Comments)    Tolerates Ciprofloxacin  RASH IN MOUTH   (can take IV route)   Augmentin  [Amoxicillin -Pot Clavulanate] Nausea Only   Bactrim  [Sulfamethoxazole -Trimethoprim ] Hives and Itching   Clarithromycin      GI upset Other reaction(s): GI Upset (intolerance) GI upset -can take but does cause mild upset    Lyrica [Pregabalin]    Nexletol [Bempedoic Acid]     Muscle pain   Statins Other (See Comments)    Myalgias   Zetia  [Ezetimibe ]     Muscle pain    Review of Systems  Constitutional:  Negative for chills, diaphoresis, fatigue and fever.  HENT:  Negative for congestion, ear pain and sinus pain.   Eyes: Negative.   Respiratory:  Negative for cough and shortness of breath.   Cardiovascular:  Negative for chest pain.  Gastrointestinal:  Negative for abdominal pain, constipation, nausea  and vomiting.  Endocrine: Negative.   Genitourinary:  Positive for dysuria, flank pain (bilateral), frequency and urgency.  Musculoskeletal:  Negative for arthralgias.  Allergic/Immunologic: Negative.   Neurological:  Negative for weakness and headaches.  Hematological: Negative.   Psychiatric/Behavioral:  Negative for dysphoric mood. The patient is not nervous/anxious.        Objective:         11/22/2023    3:28 PM 11/13/2023    12:47 PM 11/13/2023   10:47 AM  Vitals with BMI  Height 5\' 5"   5\' 5"   Weight 156 lbs 10 oz  150 lbs  BMI 26.06  24.96  Systolic 130 131   Diastolic 72 87   Pulse 69 83     No data found.   Physical Exam Constitutional:      General: She is not in acute distress.    Appearance: Normal appearance. She is not ill-appearing.  Eyes:     Conjunctiva/sclera: Conjunctivae normal.  Cardiovascular:     Rate and Rhythm: Normal rate and regular rhythm.     Heart sounds: Normal heart sounds. No murmur heard. Pulmonary:     Effort: Pulmonary effort is normal.     Breath sounds: Normal breath sounds. No wheezing.  Musculoskeletal:        General: Normal range of motion.  Neurological:     Mental Status: She is alert. Mental status is at baseline.  Psychiatric:        Mood and Affect: Mood normal.        Behavior: Behavior normal.     Health Maintenance Due  Topic Date Due   Colonoscopy  12/01/2021   Medicare Annual Wellness (AWV)  04/29/2023    There are no preventive care reminders to display for this patient.   Lab Results  Component Value Date   TSH 2.110 03/02/2023   Lab Results  Component Value Date   WBC 8.3 08/29/2023   HGB 11.9 08/29/2023   HCT 35.8 08/29/2023   MCV 95 08/29/2023   PLT 283 08/29/2023   Lab Results  Component Value Date   NA 132 (L) 08/29/2023   K 4.7 08/29/2023   CO2 24 08/29/2023   GLUCOSE 52 (L) 08/29/2023   BUN 21 08/29/2023   CREATININE 1.20 (H) 08/29/2023   BILITOT <0.2 08/29/2023   ALKPHOS 154 (H) 08/29/2023   AST 20 08/29/2023   ALT 13 08/29/2023   PROT 7.1 08/29/2023   ALBUMIN 4.2 08/29/2023   CALCIUM 9.5 08/29/2023   ANIONGAP 11 06/11/2023   EGFR 47 (L) 08/29/2023   Lab Results  Component Value Date   CHOL 202 (H) 08/29/2023   Lab Results  Component Value Date   HDL 48 08/29/2023   Lab Results  Component Value Date   LDLCALC 119 (H) 08/29/2023   Lab Results  Component Value Date   TRIG 202 (H) 08/29/2023    Lab Results  Component Value Date   CHOLHDL 4.2 08/29/2023   Lab Results  Component Value Date   HGBA1C 5.6 12/12/2022       Assessment & Plan:  Acute cystitis without hematuria Assessment & Plan: Recurrent UTI with frequency, urgency, and bilateral flank pain. Ciprofloxacin  effective previously. Allergy to levofloxacin. - Send urine for culture. - Prescribe ciprofloxacin  500 mg twice daily for 7 days. - Advise to increase fluid intake. - Instruct not to take tizanidine  while on ciprofloxacin .  Orders: -     Ciprofloxacin  HCl; Take 1 tablet (500 mg total) by  mouth 2 (two) times daily for 7 days.  Dispense: 14 tablet; Refill: 0  Dysuria Assessment & Plan: UA - positive Urine dipstick shows positive for nitrates, positive for leukocytes, and positive for urobilinogen - Urine culture ordered - Ordered Cipro  500 mg by mouth TWICE A DAY x 5 days and phenazopyridine  (PYRIDIUM ) 100 MG tablet as needed    Orders: -     POCT URINALYSIS DIP (CLINITEK) -     Urine Culture -     Phenazopyridine  HCl; Take 1 tablet (100 mg total) by mouth 3 (three) times daily as needed for pain.  Dispense: 30 tablet; Refill: 0  Other insomnia Assessment & Plan: Anxiety and Insomnia Increased anxiety and insomnia due to life stressors. Xanax  previously effective. Hydroxyzine  ineffective. Discussed benzodiazepine risks with pain medications. - Prescribe Xanax  0.5 mg, 10 tablets, with caution about overdose risk with pain medications. - Instruct to complete psychiatric medication form. - Advise follow-up for anxiety and insomnia management.   Orders: -     ALPRAZolam ; Take 1 tablet (0.25 mg total) by mouth at bedtime as needed for anxiety.  Dispense: 10 tablet; Refill: 0  GAD (generalized anxiety disorder) Assessment & Plan: Increased anxiety and insomnia due to life stressors. Xanax  previously effective. Hydroxyzine  ineffective. Discussed benzodiazepine risks with pain medications. - Prescribe  Xanax  0.5 mg, 10 tablets, with caution about overdose risk with pain medications, consulted with PCP.    11/22/2023    4:16 PM 10/18/2023   11:42 AM 12/12/2022    3:30 PM 07/06/2022    9:43 AM  GAD 7 : Generalized Anxiety Score  Nervous, Anxious, on Edge 3 3 1 3   Control/stop worrying 3 3 1 3   Worry too much - different things 3 2 1 3   Trouble relaxing 3 3 1 3   Restless 3 3 0 3  Easily annoyed or irritable 3 3 0 3  Afraid - awful might happen 3 1 0 3  Total GAD 7 Score 21 18 4 21   Anxiety Difficulty Somewhat difficult Somewhat difficult Somewhat difficult Extremely difficult    - Advise follow-up for anxiety management and UDS - Continue Prozac  as prescribed  Orders: -     ALPRAZolam ; Take 1 tablet (0.25 mg total) by mouth at bedtime as needed for anxiety.  Dispense: 10 tablet; Refill: 0  Post herpetic neuralgia Assessment & Plan: Persistent lesions and pain since shingles in September, located on the left forehead, scalp, and left forearm. Lesions are not healing well and cause severe itching. Hydroxyzine  ineffective. - Consider alternative treatments like cryotherapy as discussed previosuly       Meds ordered this encounter  Medications   ciprofloxacin  (CIPRO ) 500 MG tablet    Sig: Take 1 tablet (500 mg total) by mouth 2 (two) times daily for 7 days.    Dispense:  14 tablet    Refill:  0   ALPRAZolam  (XANAX ) 0.25 MG tablet    Sig: Take 1 tablet (0.25 mg total) by mouth at bedtime as needed for anxiety.    Dispense:  10 tablet    Refill:  0   phenazopyridine  (PYRIDIUM ) 100 MG tablet    Sig: Take 1 tablet (100 mg total) by mouth 3 (three) times daily as needed for pain.    Dispense:  30 tablet    Refill:  0    Orders Placed This Encounter  Procedures   Urine Culture   POCT URINALYSIS DIP (CLINITEK)     Follow-up: Return in about 2 weeks (  around 12/06/2023) for follow-up on anxiety medications.  An After Visit Summary was printed and given to the patient.  Delford Felling, FNP [name] [phone]

## 2023-11-22 NOTE — Assessment & Plan Note (Signed)
 UA - positive Urine dipstick shows positive for nitrates, positive for leukocytes, and positive for urobilinogen - Urine culture ordered - Ordered Cipro  500 mg by mouth TWICE A DAY x 5 days and phenazopyridine  (PYRIDIUM ) 100 MG tablet as needed

## 2023-11-23 ENCOUNTER — Other Ambulatory Visit: Payer: Self-pay

## 2023-11-24 ENCOUNTER — Other Ambulatory Visit: Payer: Self-pay | Admitting: Family Medicine

## 2023-11-24 DIAGNOSIS — R7303 Prediabetes: Secondary | ICD-10-CM

## 2023-11-25 LAB — URINE CULTURE

## 2023-11-27 ENCOUNTER — Other Ambulatory Visit (HOSPITAL_BASED_OUTPATIENT_CLINIC_OR_DEPARTMENT_OTHER): Admitting: Radiology

## 2023-11-28 ENCOUNTER — Ambulatory Visit: Payer: Self-pay | Admitting: Family Medicine

## 2023-12-05 ENCOUNTER — Other Ambulatory Visit: Payer: Self-pay | Admitting: Family Medicine

## 2023-12-05 ENCOUNTER — Other Ambulatory Visit (HOSPITAL_BASED_OUTPATIENT_CLINIC_OR_DEPARTMENT_OTHER): Admitting: Radiology

## 2023-12-11 ENCOUNTER — Ambulatory Visit (HOSPITAL_BASED_OUTPATIENT_CLINIC_OR_DEPARTMENT_OTHER)
Admission: EM | Admit: 2023-12-11 | Discharge: 2023-12-11 | Disposition: A | Discharge status: Home / Self Care | Attending: Family Medicine | Admitting: Family Medicine

## 2023-12-11 ENCOUNTER — Encounter (HOSPITAL_BASED_OUTPATIENT_CLINIC_OR_DEPARTMENT_OTHER): Payer: Self-pay | Admitting: Emergency Medicine

## 2023-12-11 ENCOUNTER — Other Ambulatory Visit (HOSPITAL_BASED_OUTPATIENT_CLINIC_OR_DEPARTMENT_OTHER): Payer: Self-pay

## 2023-12-11 DIAGNOSIS — N390 Urinary tract infection, site not specified: Secondary | ICD-10-CM | POA: Insufficient documentation

## 2023-12-11 DIAGNOSIS — R300 Dysuria: Secondary | ICD-10-CM | POA: Diagnosis not present

## 2023-12-11 DIAGNOSIS — Z87440 Personal history of urinary (tract) infections: Secondary | ICD-10-CM | POA: Diagnosis not present

## 2023-12-11 DIAGNOSIS — Z882 Allergy status to sulfonamides status: Secondary | ICD-10-CM | POA: Insufficient documentation

## 2023-12-11 DIAGNOSIS — M545 Low back pain, unspecified: Secondary | ICD-10-CM | POA: Diagnosis present

## 2023-12-11 LAB — POCT URINALYSIS DIP (MANUAL ENTRY)
Bilirubin, UA: NEGATIVE
Blood, UA: NEGATIVE
Glucose, UA: 100 mg/dL — AB
Leukocytes, UA: NEGATIVE
Nitrite, UA: POSITIVE — AB
Protein Ur, POC: 30 mg/dL — AB
Spec Grav, UA: 1.01 (ref 1.010–1.025)
Urobilinogen, UA: 1 U/dL
pH, UA: 5 (ref 5.0–8.0)

## 2023-12-11 MED ORDER — AMOXICILLIN-POT CLAVULANATE 875-125 MG PO TABS
1.0000 | ORAL_TABLET | Freq: Two times a day (BID) | ORAL | 0 refills | Status: AC
  Filled 2023-12-11: qty 14, 7d supply, fill #0

## 2023-12-11 MED ORDER — PHENAZOPYRIDINE HCL 200 MG PO TABS
200.0000 mg | ORAL_TABLET | Freq: Three times a day (TID) | ORAL | 0 refills | Status: DC
  Filled 2023-12-11: qty 6, 2d supply, fill #0

## 2023-12-11 NOTE — Progress Notes (Signed)
 Acute Office Visit  Subjective:    Patient ID: ANNIEBELLE DEVORE, female    DOB: 17-Dec-1945, 78 y.o.   MRN: [id number]  Chief Complaint  Patient presents with   Depression    HPI: Patient was seen at the urgent care at the [facility] [place] yesterday by Lauraine Settler, nurse practitioner.  She was found to have a urinary tract infection and started on Augmentin .  Patient had requested an antibiotic shot because she feels this makes her significantly better however Camie Settler, NP opted against this.  In addition, she usually takes Cipro  versus Augmentin  which helps more although looking back at her urine cultures the Augmentin  should have helped as they were sensitive to Augmentin .  Patient has had upwards of 9-10 urinary tract infections in the last 12 months.  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  She agrees to referral back to the urology group.  Patient is also concerned with increasing depression and the Cymbalta  does not seem to be helping at 60 mg twice daily.  Patient requested we try Wellbutrin  instead of Cymbalta  however I feel we should add it and then consider Cymbalta  weaning later.  Both her daughter and grandson are on the Wellbutrin  of done very well.  In addition she has been taking the hydroxyzine  which helps with the itching and perhaps her anxiety but also makes her eyes dilated and she feels loopy and off balance.  She would like to be on Xanax  but being that she is on chronic narcotics this is contraindicated.  Patient patient is also seeing dermatology and they have given her variety of creams to use in the area which helped but the itching returns after discontinuation.  They are only for short courses.  Hyperlipidemia: Patient is on Repatha 140 mg once every 2 weeks.  Hypertension: Blood pressure is well-controlled.  Patient is on amlodipine , metoprolol , and lisinopril  as well as Lasix ..   Patient has known coronary artery disease and these are treating this as well.  Patient has nitroglycerin  but rarely has to use it.  Patient has chronic back pain and they are considering a stimulator.  She has time appoint with psychiatry first to be cleared.  She is currently on Percocet 10/325 mg 1 5 times a day as well as managing 4 mg at bedtime.  Patient has Narcan in the house in case of accidental overdose.  And she is discussed with her daughter where it is located.  Prediabetes/weight management: Patient is on Mounjaro  12.5 mg weekly.  Bronchiectasis: Patient is on Trelegy 1 inhalation daily and has a AfloVest which she uses when she feels like her chest is getting tight.  Patient has reflux and is currently on omeprazole  40 mg once daily as needed.      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 07/25/2014   Followed in Pulmonary clinic/ [facility]/ [place]    Cardiac dysrhythmia    Chronic idiopathic constipation    Colitis, acute 02/08/2021   Coronary artery disease    Depression, major, recurrent, moderate (HCC)    Headache  Heart murmur    History of bladder infections    History of COVID-19    Hyperlipidemia    Hypertension    Hypothyroidism    Knee pain, bilateral    Melena 02/08/2021   Osteoarthritis    Pneumonia    Primary insomnia    Recovering alcoholic (HCC)    SVT (supraventricular tachycardia) (HCC)    UTI (urinary tract infection)    Varicose veins    Vitamin B12 deficiency     Past Surgical History:  Procedure Laterality Date   ABDOMINAL HYSTERECTOMY  1991   APPENDECTOMY     BACK SURGERY     had 2 surgeries. Have rods placed in back    BREAST BIOPSY Left    x2   BREAST ENHANCEMENT SURGERY  2006   CARDIAC  CATHETERIZATION  12/22/2020   stents placed   CHOLECYSTECTOMY  12/2019   removed lap band.    COLONOSCOPY  12/01/2016   Moderate predominantly sigmod diverticulosis. Otherwise grossly normal colonoscopy   EYE SURGERY     IOL- bilateral - [place]   KNEE ARTHROSCOPY Left    lap band surgery  1981   LUMBAR FUSION  07/29/2015   posterior level one   removal of cervical disc fragments  10/2007   pt. denies    TUBAL LIGATION  1970    Family History  Problem Relation Age of Onset   Heart disease Mother    Heart disease Father    Diabetes Sister    Other Sister        BRAIN TUMOR   Heart disease Brother    Heart disease Brother    Heart disease Brother    Heart disease Brother    Heart disease Brother    Colon cancer Maternal Aunt    Colon cancer Cousin     Social History   Socioeconomic History   Marital status: Widowed    Spouse name: Not on file   Number of children: 2   Years of education: Not on file   Highest education level: Not on file  Occupational History   Occupation: retired    Comment: nurse  Tobacco Use   Smoking status: Never   Smokeless tobacco: Never  Vaping Use   Vaping status: Never Used  Substance and Sexual Activity   Alcohol use: No    Alcohol/week: 0.0 standard drinks of alcohol    Comment: recovery x 20 yrs   Drug use: No   Sexual activity: Not Currently    Comment: MARRIED  Other Topics Concern   Not on file  Social History Narrative   Not on file   Social Drivers of Health   Financial Resource Strain: Low Risk  (12/12/2022)   Overall Financial Resource Strain (CARDIA)    Difficulty of Paying Living Expenses: Not hard at all  Food Insecurity: No Food Insecurity (10/18/2023)   Hunger Vital Sign    Worried About Running Out of Food in the Last Year: Never true    Ran Out of Food in the Last Year: Never true  Transportation Needs: No Transportation Needs (10/18/2023)   PRAPARE - Administrator, Civil Service (Medical): No     Lack of Transportation (Non-Medical): No  Physical Activity: Inactive (12/12/2022)   Exercise Vital Sign    Days of Exercise per Week: 0 days    Minutes of Exercise per Session: 0 min  Stress: No Stress Concern Present (12/12/2022)   [employer] of Occupational Health -  Occupational Stress Questionnaire    Feeling of Stress : Not at all  Social Connections: Moderately Isolated (12/12/2022)   Social Connection and Isolation Panel    Frequency of Communication with Friends and Family: Three times a week    Frequency of Social Gatherings with Friends and Family: Three times a week    Attends Religious Services: More than 4 times per year    Active Member of Clubs or Organizations: No    Attends Banker Meetings: Never    Marital Status: Widowed  Intimate Partner Violence: Not At Risk (10/18/2023)   Humiliation, Afraid, Rape, and Kick questionnaire    Fear of Current or Ex-Partner: No    Emotionally Abused: No    Physically Abused: No    Sexually Abused: No    Outpatient Medications Prior to Visit  Medication Sig Dispense Refill   albuterol  (VENTOLIN  HFA) 108 (90 Base) MCG/ACT inhaler Inhale 2 puffs into the lungs every 6 (six) hours as needed for wheezing or shortness of breath. 8 g 2   ALPRAZolam  (XANAX ) 0.25 MG tablet Take 1 tablet (0.25 mg total) by mouth at bedtime as needed for anxiety. 10 tablet 0   amLODipine  (NORVASC ) 5 MG tablet Take 1 tablet (5 mg total) by mouth daily. 90 tablet 0   amoxicillin -clavulanate (AUGMENTIN ) 875-125 MG tablet Take 1 tablet by mouth 2 (two) times daily after a meal for 7 days. 14 tablet 0   Ascorbic Acid (VITAMIN C) 1000 MG tablet Take 1,000 mg by mouth daily.     Cholecalciferol (D3 PO) Take 2 tablets by mouth daily. chewable     collagenase  (SANTYL ) 250 UNIT/GM ointment Apply 1 Application topically daily. 15 g 1   DULoxetine  (CYMBALTA ) 60 MG capsule TAKE 1 CAPSULE(60 MG TOTAL) BY MOUTH TWICE DAILY 180 capsule 0    Fluticasone -Umeclidin-Vilant (TRELEGY ELLIPTA ) 100-62.5-25 MCG/ACT AEPB Inhale 1 puff into the lungs daily. 1 each 11   furosemide  (LASIX ) 20 MG tablet Take 20 mg by mouth daily.     levothyroxine  (SYNTHROID ) 75 MCG tablet TAKE 1 TABLET(75 MCG) BY MOUTH DAILY 90 tablet 1   lidocaine  (XYLOCAINE ) 5 % ointment Apply 1 Application topically as needed. 35.44 g 0   lidocaine -prilocaine  (EMLA ) cream Apply 1 Application topically 4 (four) times daily as needed (pain). 30 g 0   lisinopril  (ZESTRIL ) 2.5 MG tablet Take 2.5 mg by mouth daily.     MAGNESIUM PO Take 1 tablet by mouth daily. chewable     metoprolol  tartrate (LOPRESSOR ) 50 MG tablet TAKE 1 TABLET(50 MG) BY MOUTH TWICE DAILY WITH MEALS 180 tablet 1   montelukast  (SINGULAIR ) 10 MG tablet Take 10 mg by mouth daily as needed (allergies).     MOUNJARO  12.5 MG/0.5ML Pen ADMINISTER 12.5 MG UNDER THE SKIN 1 TIME A WEEK FOR 16 DOSES 2 mL 3   naloxone (NARCAN) nasal spray 4 mg/0.1 mL Place 1 spray into the nose once. In case of opioid overdose.     nitroGLYCERIN  (NITROSTAT ) 0.4 MG SL tablet DISSOLVE ONE TABLET UNDER TONGUE AS NEEDED FOR CHEST PAIN EVERY 5 MINUTES FOR UP TO 3 DOSES 25 tablet 1   omeprazole  (PRILOSEC) 40 MG capsule Take 40 mg by mouth daily as needed (reflux).     ondansetron  (ZOFRAN -ODT) 8 MG disintegrating tablet Take 8 mg by mouth every 8 (eight) hours as needed for nausea.     oxyCODONE -acetaminophen  (PERCOCET) 10-325 MG tablet Take 1 tablet by mouth See admin instructions. Take 1 tablet by mouth  5 times a day. May skip a dose if not needed.     phenazopyridine  (PYRIDIUM ) 200 MG tablet Take 1 tablet (200 mg total) by mouth 3 (three) times daily. 6 tablet 0   REPATHA SURECLICK 140 MG/ML SOAJ Inject 140 mg into the skin every 14 (fourteen) days.     RESTASIS 0.05 % ophthalmic emulsion Place 1 drop into both eyes 2 (two) times daily as needed (dryness).     tiZANidine  (ZANAFLEX ) 4 MG tablet Take 4 mg by mouth at bedtime as needed for muscle  spasms.     tretinoin (RETIN-A) 0.025 % gel Apply 1 Application topically at bedtime.     hydrOXYzine  (VISTARIL ) 50 MG capsule Take 1 capsule (50 mg total) by mouth 3 (three) times daily as needed. 90 capsule 2   No facility-administered medications prior to visit.    Allergies  Allergen Reactions   Levofloxacin Hives, Shortness Of Breath, Swelling and Other (See Comments)    Tolerates Ciprofloxacin  RASH IN MOUTH   (can take IV route)   Bactrim  [Sulfamethoxazole -Trimethoprim ] Hives and Itching   Clarithromycin      GI upset Other reaction(s): GI Upset (intolerance) GI upset -can take but does cause mild upset    Lyrica [Pregabalin]    Nexletol [Bempedoic Acid]     Muscle pain   Statins Other (See Comments)    Myalgias   Zetia  [Ezetimibe ]     Muscle pain    Review of Systems  Constitutional:  Negative for chills, fatigue and fever.  HENT:  Negative for congestion, ear pain, rhinorrhea and sore throat.   Respiratory:  Negative for cough and shortness of breath.   Cardiovascular:  Negative for chest pain.  Gastrointestinal:  Negative for abdominal pain, constipation, diarrhea, nausea and vomiting.  Genitourinary:  Negative for dysuria and urgency.  Musculoskeletal:  Negative for back pain and myalgias.  Neurological:  Negative for dizziness, weakness, light-headedness and headaches.  Psychiatric/Behavioral:  Negative for dysphoric mood. The patient is not nervous/anxious.        Objective:        12/12/2023    9:52 AM 12/11/2023   10:25 AM 11/22/2023    3:28 PM  Vitals with BMI  Height 5' 5  5' 5  Weight 155 lbs  156 lbs 10 oz  BMI 25.79  26.06  Systolic 136 150 869  Diastolic 72 84 72  Pulse 86 64 69    No data found.   Physical Exam Vitals reviewed.  Constitutional:      Appearance: Normal appearance. She is normal weight.  Neck:     Vascular: No carotid bruit.   Cardiovascular:     Rate and Rhythm: Normal rate and regular rhythm.     Heart sounds:  Normal heart sounds.  Pulmonary:     Effort: Pulmonary effort is normal. No respiratory distress.     Breath sounds: Normal breath sounds.  Abdominal:     General: Abdomen is flat. Bowel sounds are normal.     Palpations: Abdomen is soft.     Tenderness: There is no abdominal tenderness. There is right CVA tenderness (low back pain).   Neurological:     Mental Status: She is alert and oriented to person, place, and time.   Psychiatric:        Mood and Affect: Mood normal.        Behavior: Behavior normal.     Health Maintenance Due  Topic Date Due   Colonoscopy  12/01/2021  Medicare Annual Wellness (AWV)  04/29/2023   Zoster Vaccines- Shingrix (2 of 2) 12/05/2023    There are no preventive care reminders to display for this patient.   Lab Results  Component Value Date   TSH 2.110 03/02/2023   Lab Results  Component Value Date   WBC 8.3 08/29/2023   HGB 11.9 08/29/2023   HCT 35.8 08/29/2023   MCV 95 08/29/2023   PLT 283 08/29/2023   Lab Results  Component Value Date   NA 132 (L) 08/29/2023   K 4.7 08/29/2023   CO2 24 08/29/2023   GLUCOSE 52 (L) 08/29/2023   BUN 21 08/29/2023   CREATININE 1.20 (H) 08/29/2023   BILITOT <0.2 08/29/2023   ALKPHOS 154 (H) 08/29/2023   AST 20 08/29/2023   ALT 13 08/29/2023   PROT 7.1 08/29/2023   ALBUMIN 4.2 08/29/2023   CALCIUM 9.5 08/29/2023   ANIONGAP 11 06/11/2023   EGFR 47 (L) 08/29/2023   Lab Results  Component Value Date   CHOL 202 (H) 08/29/2023   Lab Results  Component Value Date   HDL 48 08/29/2023   Lab Results  Component Value Date   LDLCALC 119 (H) 08/29/2023   Lab Results  Component Value Date   TRIG 202 (H) 08/29/2023   Lab Results  Component Value Date   CHOLHDL 4.2 08/29/2023   Lab Results  Component Value Date   HGBA1C 5.6 12/12/2022       Assessment & Plan:  Bronchiectasis without complication (HCC) Assessment & Plan: Stable. Continue Trelegy 1 inhalation daily. Recommend more  frequent use of AfloVest.   Acute cystitis without hematuria Assessment & Plan: Check UA Given rocephin  1 gm IM Referred to urology.   Orders: -     Ambulatory referral to Urology -     POCT URINALYSIS DIP (CLINITEK); Future -     cefTRIAXone  Sodium  Mixed hyperlipidemia Assessment & Plan: Patient to return for labs.  Currently on  repatha Continue to work on eating a healthy diet and exercise.  To return for Labs.    Orders: -     Lipid panel; Future  Moderate recurrent major depression (HCC) Assessment & Plan: Continue Cymbalta  60 mg twice daily.  Start on Wellbutrin  xl 150 mg once daily in am.   Orders: -     buPROPion  HCl ER (XL); Take 1 tablet (150 mg total) by mouth daily.  Dispense: 30 tablet; Refill: 0  Postherpetic neuralgia Assessment & Plan: Stop hydroxyzine .    Spinal stenosis of lumbar region with neurogenic claudication Assessment & Plan: Management per specialist.     Prediabetes Assessment & Plan: Recommend continue to work on eating healthy diet and exercise. Check A1c.   Orders: -     Hemoglobin A1c; Future  Benign essential HTN Assessment & Plan: The current medical regimen is effective;  continue present plan and medications. Continue amlodipine , metoprolol , and lisinopril  as well as Lasix . Return for labs.    Orders: -     CBC with Differential/Platelet; Future -     Comprehensive metabolic panel with GFR; Future  Stage 3b chronic kidney disease (HCC) Assessment & Plan: Stable. Return for labs.       Meds ordered this encounter  Medications   buPROPion  (WELLBUTRIN  XL) 150 MG 24 hr tablet    Sig: Take 1 tablet (150 mg total) by mouth daily.    Dispense:  30 tablet    Refill:  0   DISCONTD: cefTRIAXone  (ROCEPHIN ) 1 g injection  Sig: Inject 1 g into the muscle once for 1 dose.    Dispense:  1 each    Refill:  0   cefTRIAXone  (ROCEPHIN ) injection 1 g    Orders Placed This Encounter  Procedures   CBC with  Differential/Platelet   Comprehensive metabolic panel with GFR   Hemoglobin A1c   Lipid panel   Ambulatory referral to Urology   POCT URINALYSIS DIP (CLINITEK)     Follow-up: Return in about 4 weeks (around 01/09/2024) for chronic follow up, lab visit in 2 weeks fasting.  An After Visit Summary was printed and given to the patient.  Abigail Free, MD [facility] [phone]

## 2023-12-11 NOTE — Discharge Instructions (Signed)
 Acute UTI: UA is positive.  Urine culture sent.  Will adjust the plan of care, as needed once culture results.  Will use Augmentin  875/125 mg, 1 pill twice daily for 7 days.  Encouraged use of probiotics to prevent antibiotic related diarrhea.  Provided Pyridium  200 mg, 1 pill every 8 hours if needed for bladder pain.  Get plenty of fluids and rest.  Encouraged use of aloe vera pills, 25 mg, 1 pill, twice daily if needed for bladder pain (this is over-the-counter).  Contact primary care.  Patient is reporting 8 UTIs in 2024 and 5 UTIs since January 2025.  She needs to see urology due to chronic UTIs and being high risk for resistance to antibiotics.

## 2023-12-11 NOTE — ED Triage Notes (Addendum)
 Pt c/o left lower back pain started last night she thinks its a UTI. Pt has been using AZO.

## 2023-12-11 NOTE — ED Provider Notes (Addendum)
 [facility]    CSN: [id number] Arrival date & time: 12/11/23  9087      History   Chief Complaint No chief complaint on file.   HPI Courtney [name] is a 78 y.o. female.   79 year old female who reports acute onset left lower back pain that started during the evening of 12/10/2023.  She has mild burning with urination.  She has gotten Azo to use OTC.  She thinks she has an acute UTI.  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  She is reporting 8 UTIs in 2024 and 5 UTIs since January 2025.  She reports that only Augmentin  and Cipro  worked for her UTIs.  Although she does report an allergy to Levaquin.  She reports allergy to sulfa  drugs.  She reports nitrofurantoin /Macrobid  does not work for her UTIs.  She is requesting Cipro  for this UTI.  Patient is also requesting Rocephin  injection today.     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 07/25/2014   Followed in Pulmonary clinic/ [facility]/ [place]    Cardiac dysrhythmia    Chronic idiopathic constipation    Colitis, acute 02/08/2021   Coronary artery disease    Depression, major, recurrent, moderate (HCC)    Headache    Heart murmur    History of bladder infections    History of COVID-19    Hyperlipidemia    Hypertension    Hypothyroidism    Knee pain, bilateral    Melena 02/08/2021   Osteoarthritis    Pneumonia    Primary insomnia    Recovering alcoholic (HCC)    SVT (supraventricular tachycardia) (HCC)    UTI (urinary tract infection)    Varicose veins    Vitamin B12 deficiency     Patient Active Problem List   Diagnosis Date Noted   Other insomnia 11/22/2023   Postherpetic neuralgia 10/22/2023   Pain in joint involving left pelvic region and thigh 09/28/2023   Screen for colon cancer 09/28/2023   Other fatigue 08/29/2023   Dysuria 08/29/2023   Acute diarrhea 07/28/2023   Persistent cough  07/24/2023   Non-recurrent acute suppurative otitis media of both ears without spontaneous rupture of tympanic membranes 07/14/2023   Hyponatremia 06/11/2023   Hypokalemia 06/11/2023   Acute respiratory failure with hypoxia (HCC) 06/10/2023   Interstitial lung disease (HCC) 06/10/2023   Encounter for immunization 04/20/2023   Moderate recurrent major depression (HCC) 03/20/2023   Encounter for post fall examination 03/10/2023   Localized edema 03/04/2023   Post herpetic neuralgia 02/28/2023   Hypertension 02/28/2023   Acute cough 02/15/2023   Left flank pain 12/15/2022   Diverticulitis 12/15/2022   Malaise 09/06/2022   Lower extremity edema 09/06/2022   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 07/27/2022   Urinary urgency 07/13/2022   GAD (generalized anxiety disorder) 07/10/2022   Overweight with body mass index (BMI) of 25 to 25.9 in adult 07/10/2022   Chronic low back pain 06/08/2022   Pernicious anemia 04/25/2022   Syncope 02/08/2022   Difficulty urinating 12/10/2021   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 10/24/2021   Stage 3b chronic kidney disease (HCC) 10/24/2021   Vitamin D  insufficiency 10/24/2021   Shoulder impingement syndrome, right 09/30/2021   Mild recurrent major depression (HCC) 07/19/2021   Telogen effluvium 04/17/2021   Prediabetes 04/17/2021   Coronary artery disease involving native coronary artery of native heart  without angina pectoris 04/17/2021   History of percutaneous coronary intervention 12/23/2020   Hypothyroidism 10/06/2019   Chronic idiopathic constipation 10/06/2019   Non-seasonal allergic rhinitis due to pollen 10/06/2019   Mixed hyperlipidemia 10/02/2019   Supraventricular tachycardia (HCC) 10/02/2019   Myalgia due to statin 10/02/2019   Peripheral vascular disease (HCC) 09/11/2019   Bronchiectasis without complication (HCC) 08/23/2019   Acute cystitis without hematuria 08/23/2019   Spinal stenosis of lumbar region with neurogenic  claudication 09/03/2018   Family history of early CAD 04/09/2018   Spondylolisthesis of lumbar region 07/29/2015   Varicose veins of both lower extremities with pain 02/10/2015    Past Surgical History:  Procedure Laterality Date   ABDOMINAL HYSTERECTOMY  1991   APPENDECTOMY     BACK SURGERY     had 2 surgeries. Have rods placed in back    BREAST BIOPSY Left    x2   BREAST ENHANCEMENT SURGERY  2006   CARDIAC CATHETERIZATION  12/22/2020   stents placed   CHOLECYSTECTOMY  12/2019   removed lap band.    COLONOSCOPY  12/01/2016   Moderate predominantly sigmod diverticulosis. Otherwise grossly normal colonoscopy   EYE SURGERY     IOL- bilateral - [place]   KNEE ARTHROSCOPY Left    lap band surgery  1981   LUMBAR FUSION  07/29/2015   posterior level one   removal of cervical disc fragments  10/2007   pt. denies    TUBAL LIGATION  1970    OB History   No obstetric history on file.      Home Medications    Prior to Admission medications   Medication Sig Start Date End Date Taking? Authorizing Provider  amLODipine  (NORVASC ) 5 MG tablet Take 1 tablet (5 mg total) by mouth daily. 06/15/23  Yes [name], Kirsten, MD  amoxicillin -clavulanate (AUGMENTIN ) 875-125 MG tablet Take 1 tablet by mouth 2 (two) times daily after a meal for 7 days. 12/11/23 12/18/23 Yes Ival Domino, FNP  hydrOXYzine  (VISTARIL ) 50 MG capsule Take 1 capsule (50 mg total) by mouth 3 (three) times daily as needed. 10/18/23  Yes [name], Kirsten, MD  levothyroxine  (SYNTHROID ) 75 MCG tablet TAKE 1 TABLET(75 MCG) BY MOUTH DAILY 05/22/23  Yes [name], Kirsten, MD  lisinopril  (ZESTRIL ) 2.5 MG tablet Take 2.5 mg by mouth daily. 04/06/23  Yes [provider]  phenazopyridine  (PYRIDIUM ) 200 MG tablet Take 1 tablet (200 mg total) by mouth 3 (three) times daily. 12/11/23  Yes Ival Domino, FNP  REPATHA SURECLICK 140 MG/ML SOAJ Inject 140 mg into the skin every 14 (fourteen) days.   Yes [provider]  albuterol   (VENTOLIN  HFA) 108 (90 Base) MCG/ACT inhaler Inhale 2 puffs into the lungs every 6 (six) hours as needed for wheezing or shortness of breath. 07/14/23   Teressa Harrie HERO, FNP  ALPRAZolam  (XANAX ) 0.25 MG tablet Take 1 tablet (0.25 mg total) by mouth at bedtime as needed for anxiety. 11/22/23   Teressa Harrie HERO, FNP  Ascorbic Acid (VITAMIN C) 1000 MG tablet Take 1,000 mg by mouth daily.    [provider]  Cholecalciferol (D3 PO) Take 2 tablets by mouth daily. chewable    [provider]  collagenase  (SANTYL ) 250 UNIT/GM ointment Apply 1 Application topically daily. 10/18/23   [name], Abigail, MD  DULoxetine  (CYMBALTA ) 60 MG capsule TAKE 1 CAPSULE(60 MG TOTAL) BY MOUTH TWICE DAILY 11/23/23   [name], Kirsten, MD  Fluticasone -Umeclidin-Vilant (TRELEGY ELLIPTA ) 100-62.5-25 MCG/ACT AEPB Inhale 1 puff into the lungs daily. 07/28/23  Sirivol, Mamatha, MD  furosemide  (LASIX ) 20 MG tablet Take 20 mg by mouth daily.    [provider]  lidocaine  (XYLOCAINE ) 5 % ointment Apply 1 Application topically as needed. 11/13/23   Silver Wonda LABOR, PA  lidocaine -prilocaine  (EMLA ) cream Apply 1 Application topically 4 (four) times daily as needed (pain). 10/18/23   CoxAbigail, MD  MAGNESIUM PO Take 1 tablet by mouth daily. chewable    [provider]  metoprolol  tartrate (LOPRESSOR ) 50 MG tablet TAKE 1 TABLET(50 MG) BY MOUTH TWICE DAILY WITH MEALS 12/05/23   [name], Kirsten, MD  montelukast  (SINGULAIR ) 10 MG tablet Take 10 mg by mouth daily as needed (allergies).    [provider]  MOUNJARO  12.5 MG/0.5ML Pen ADMINISTER 12.5 MG UNDER THE SKIN 1 TIME A WEEK FOR 16 DOSES 11/25/23   Teressa Harrie HERO, FNP  naloxone [facility]) nasal spray 4 mg/0.1 mL Place 1 spray into the nose once. In case of opioid overdose.    [provider]  nitroGLYCERIN  (NITROSTAT ) 0.4 MG SL tablet DISSOLVE ONE TABLET UNDER TONGUE AS NEEDED FOR CHEST PAIN EVERY 5 MINUTES FOR UP TO 3 DOSES 02/24/23   Sirivol, Tommy, MD   omeprazole  (PRILOSEC) 40 MG capsule Take 40 mg by mouth daily as needed (reflux).    [provider]  ondansetron  (ZOFRAN -ODT) 8 MG disintegrating tablet Take 8 mg by mouth every 8 (eight) hours as needed for nausea. 06/09/23   [provider]  oxyCODONE -acetaminophen  (PERCOCET) 10-325 MG tablet Take 1 tablet by mouth See admin instructions. Take 1 tablet by mouth 5 times a day. May skip a dose if not needed.    [provider]  RESTASIS 0.05 % ophthalmic emulsion Place 1 drop into both eyes 2 (two) times daily as needed (dryness). 01/09/23   [provider]  tiZANidine  (ZANAFLEX ) 4 MG tablet Take 4 mg by mouth at bedtime as needed for muscle spasms. 06/07/23   [provider]  tretinoin (RETIN-A) 0.025 % gel Apply 1 Application topically at bedtime.    [provider]    Family History Family History  Problem Relation Age of Onset   Heart disease Mother    Heart disease Father    Diabetes Sister    Other Sister        BRAIN TUMOR   Heart disease Brother    Heart disease Brother    Heart disease Brother    Heart disease Brother    Heart disease Brother    Colon cancer Maternal Aunt    Colon cancer Cousin     Social History Social History   Tobacco Use   Smoking status: Never   Smokeless tobacco: Never  Vaping Use   Vaping status: Never Used  Substance Use Topics   Alcohol use: No    Alcohol/week: 0.0 standard drinks of alcohol    Comment: recovery x 20 yrs   Drug use: No     Allergies   Levofloxacin, Bactrim  [sulfamethoxazole -trimethoprim ], Clarithromycin , Lyrica [pregabalin], Nexletol [bempedoic acid], Statins, and Zetia  [ezetimibe ]   Review of Systems Review of Systems  Constitutional:  Negative for fever.  Respiratory:  Negative for cough.   Cardiovascular:  Negative for chest pain.  Gastrointestinal:  Negative for abdominal pain, constipation, diarrhea, nausea and vomiting.  Genitourinary:  Positive for  dysuria.  Musculoskeletal:  Positive for back pain. Negative for arthralgias.  Skin:  Negative for color change and rash.  Neurological:  Negative for syncope.  All other systems reviewed and are  negative.    Physical Exam Triage Vital Signs ED Triage Vitals  Encounter Vitals Group     BP 12/11/23 1025 (!) 150/84     Girls Systolic BP Percentile --      Girls Diastolic BP Percentile --      Boys Systolic BP Percentile --      Boys Diastolic BP Percentile --      Pulse Rate 12/11/23 1025 64     Resp 12/11/23 1025 18     Temp 12/11/23 1025 97.8 F (36.6 C)     Temp Source 12/11/23 1025 Oral     SpO2 12/11/23 1025 97 %     Weight --      Height --      Head Circumference --      Peak Flow --      Pain Score 12/11/23 1022 9     Pain Loc --      Pain Education --      Exclude from Growth Chart --    No data found.  Updated Vital Signs BP (!) 150/84 (BP Location: Right Arm)   Pulse 64   Temp 97.8 F (36.6 C) (Oral)   Resp 18   SpO2 97%   Visual Acuity Right Eye Distance:   Left Eye Distance:   Bilateral Distance:    Right Eye Near:   Left Eye Near:    Bilateral Near:     Physical Exam Vitals and nursing note reviewed.  Constitutional:      General: She is not in acute distress.    Appearance: She is well-developed. She is ill-appearing. She is not toxic-appearing.  HENT:     Head: Normocephalic and atraumatic.     Right Ear: Hearing, tympanic membrane, ear canal and external ear normal.     Left Ear: Hearing, tympanic membrane, ear canal and external ear normal.     Nose: No congestion or rhinorrhea.     Right Sinus: No maxillary sinus tenderness or frontal sinus tenderness.     Left Sinus: No maxillary sinus tenderness or frontal sinus tenderness.     Mouth/Throat:     Lips: Pink.     Mouth: Mucous membranes are moist.     Pharynx: Uvula midline. No oropharyngeal exudate or posterior oropharyngeal erythema.     Tonsils: No tonsillar exudate.   Eyes:      Conjunctiva/sclera: Conjunctivae normal.     Pupils: Pupils are equal, round, and reactive to light.    Cardiovascular:     Rate and Rhythm: Normal rate and regular rhythm.     Heart sounds: S1 normal and S2 normal. No murmur heard. Pulmonary:     Effort: Pulmonary effort is normal. No respiratory distress.     Breath sounds: Normal breath sounds. No decreased breath sounds, wheezing, rhonchi or rales.  Abdominal:     General: Bowel sounds are normal.     Palpations: Abdomen is soft.     Tenderness: There is abdominal tenderness (mild) in the suprapubic area. There is left CVA tenderness. There is no right CVA tenderness, guarding or rebound. Negative signs include Murphy's sign, Rovsing's sign and McBurney's sign.   Musculoskeletal:        General: Tenderness present. No swelling.     Cervical back: Neck supple.  Lymphadenopathy:     Head:     Right side of head: No submental, submandibular, tonsillar, preauricular or posterior auricular adenopathy.     Left side of head: No submental,  submandibular, tonsillar, preauricular or posterior auricular adenopathy.     Cervical: No cervical adenopathy.     Right cervical: No superficial cervical adenopathy.    Left cervical: No superficial cervical adenopathy.   Skin:    General: Skin is warm and dry.     Capillary Refill: Capillary refill takes less than 2 seconds.     Findings: No rash.   Neurological:     Mental Status: She is alert and oriented to person, place, and time.   Psychiatric:        Mood and Affect: Mood normal.      UC Treatments / Results  Labs (all labs ordered are listed, but only abnormal results are displayed) Labs Reviewed  POCT URINALYSIS DIP (MANUAL ENTRY) - Abnormal; Notable for the following components:      Result Value   Color, UA orange (*)    Glucose, UA =100 (*)    Ketones, POC UA small (15) (*)    Protein Ur, POC =30 (*)    Nitrite, UA Positive (*)    All other components within normal  limits  URINE CULTURE    EKG   Radiology No results found.  Procedures Procedures (including critical care time)  Medications Ordered in UC Medications - No data to display  Initial Impression / Assessment and Plan / UC Course  I have reviewed the triage vital signs and the nursing notes.  Pertinent labs & imaging results that were available during my care of the patient were reviewed by me and considered in my medical decision making (see chart for details).  Plan of Care: Acute UTI: Augmentin  875/125, 1 pill twice daily for 7 days.  Encouraged to eat yogurt daily or take probiotics to prevent antibiotic related diarrhea from Augmentin .  Pyridium  200 mg 1 pill every 8 hours as needed for pain.  Encouraged aloe vera 25 mg twice daily if needed for bladder pain.  Get plenty of fluids and rest.  Follow-up with primary care.  Needs to be referred to urology for chronic UTIs.  She is high risk to develop antibiotic resistance due to chronic UTIs and regular antibiotic use.  I reviewed the plan of care with the patient and/or the patient's guardian.  The patient and/or guardian had time to ask questions and acknowledged that the questions were answered.  I provided instruction on symptoms or reasons to return here or to go to an ER, if symptoms/condition did not improve, worsened or if new symptoms occurred.  Final Clinical Impressions(s) / UC Diagnoses   Final diagnoses:  Dysuria  Urinary tract infection without hematuria, site unspecified     Discharge Instructions      Acute UTI: UA is positive.  Urine culture sent.  Will adjust the plan of care, as needed once culture results.  Will use Augmentin  875/125 mg, 1 pill twice daily for 7 days.  Encouraged use of probiotics to prevent antibiotic related diarrhea.  Provided Pyridium  200 mg, 1 pill every 8 hours if needed for bladder pain.  Get plenty of fluids and rest.  Encouraged use of aloe vera pills, 25 mg, 1 pill, twice daily if  needed for bladder pain (this is over-the-counter).  Contact primary care.  Patient is reporting 8 UTIs in 2024 and 5 UTIs since January 2025.  She needs to see urology due to chronic UTIs and being high risk for resistance to antibiotics.     ED Prescriptions     Medication Sig Dispense Auth. Provider  amoxicillin -clavulanate (AUGMENTIN ) 875-125 MG tablet Take 1 tablet by mouth 2 (two) times daily after a meal for 7 days. 14 tablet Issabela Lesko, FNP   phenazopyridine  (PYRIDIUM ) 200 MG tablet Take 1 tablet (200 mg total) by mouth 3 (three) times daily. 6 tablet Annalyssa Thune, FNP      PDMP not reviewed this encounter.   Ival Domino, FNP 12/11/23 1107    Ival Domino, FNP 12/11/23 [phone]

## 2023-12-12 ENCOUNTER — Encounter: Payer: Self-pay | Admitting: Family Medicine

## 2023-12-12 ENCOUNTER — Encounter

## 2023-12-12 ENCOUNTER — Ambulatory Visit: Admitting: Family Medicine

## 2023-12-12 ENCOUNTER — Ambulatory Visit (HOSPITAL_BASED_OUTPATIENT_CLINIC_OR_DEPARTMENT_OTHER): Payer: Self-pay | Admitting: Family Medicine

## 2023-12-12 VITALS — BP 136/72 | HR 86 | Temp 97.8°F | Ht 65.0 in | Wt 155.0 lb

## 2023-12-12 DIAGNOSIS — M48062 Spinal stenosis, lumbar region with neurogenic claudication: Secondary | ICD-10-CM

## 2023-12-12 DIAGNOSIS — R7303 Prediabetes: Secondary | ICD-10-CM

## 2023-12-12 DIAGNOSIS — N3000 Acute cystitis without hematuria: Secondary | ICD-10-CM

## 2023-12-12 DIAGNOSIS — N1832 Chronic kidney disease, stage 3b: Secondary | ICD-10-CM | POA: Diagnosis not present

## 2023-12-12 DIAGNOSIS — B0229 Other postherpetic nervous system involvement: Secondary | ICD-10-CM | POA: Diagnosis not present

## 2023-12-12 DIAGNOSIS — J479 Bronchiectasis, uncomplicated: Secondary | ICD-10-CM | POA: Diagnosis not present

## 2023-12-12 DIAGNOSIS — F331 Major depressive disorder, recurrent, moderate: Secondary | ICD-10-CM | POA: Diagnosis not present

## 2023-12-12 DIAGNOSIS — I10 Essential (primary) hypertension: Secondary | ICD-10-CM

## 2023-12-12 DIAGNOSIS — E782 Mixed hyperlipidemia: Secondary | ICD-10-CM

## 2023-12-12 LAB — URINE CULTURE: Culture: NO GROWTH

## 2023-12-12 MED ORDER — BUPROPION HCL ER (XL) 150 MG PO TB24: 150.0000 mg | ORAL_TABLET | Freq: Every day | ORAL | 0 refills | Status: DC

## 2023-12-12 MED ORDER — CEFTRIAXONE SODIUM 1 G IJ SOLR
1.0000 g | Freq: Once | INTRAMUSCULAR | Status: AC
Start: 2023-12-12 — End: 2023-12-12
  Administered 2023-12-12: 1 g via INTRAMUSCULAR

## 2023-12-12 MED ORDER — CEFTRIAXONE SODIUM 1 G IJ SOLR: 1.0000 g | Freq: Once | INTRAMUSCULAR | 0 refills | Status: DC

## 2023-12-12 NOTE — Progress Notes (Signed)
 Patient updated.  Urine culture was negative no UTI.  Patient had been given Augmentin  and she feels like it has helped some with her symptoms.  She still having some back pain and feels like she has a UTI but the urine culture was completely normal.  Advised that if her back pain continues to bother her she should see primary care and get evaluated for her back problems.

## 2023-12-12 NOTE — Patient Instructions (Signed)
 Start on Wellbutrin  XL 150 mg once daily in AM.  Complete Augmentin .  Refer to urology.  Return for lab work fasting in 2 weeks.  Follow-up in 4 weeks for depression.

## 2023-12-13 ENCOUNTER — Encounter (HOSPITAL_BASED_OUTPATIENT_CLINIC_OR_DEPARTMENT_OTHER)

## 2023-12-13 ENCOUNTER — Ambulatory Visit: Admitting: Pulmonary Disease

## 2023-12-14 ENCOUNTER — Ambulatory Visit: Admitting: Family Medicine

## 2023-12-15 ENCOUNTER — Ambulatory Visit (HOSPITAL_BASED_OUTPATIENT_CLINIC_OR_DEPARTMENT_OTHER)
Admission: RE | Admit: 2023-12-15 | Discharge: 2023-12-15 | Disposition: A | Discharge status: Home / Self Care | Source: Ambulatory Visit | Attending: Adult Health | Admitting: Adult Health

## 2023-12-15 ENCOUNTER — Ambulatory Visit (HOSPITAL_BASED_OUTPATIENT_CLINIC_OR_DEPARTMENT_OTHER)
Admission: RE | Admit: 2023-12-15 | Discharge: 2023-12-15 | Disposition: A | Discharge status: Home / Self Care | Source: Ambulatory Visit | Attending: Family Medicine | Admitting: Family Medicine

## 2023-12-15 ENCOUNTER — Other Ambulatory Visit: Payer: Self-pay | Admitting: Family Medicine

## 2023-12-15 ENCOUNTER — Telehealth: Payer: Self-pay

## 2023-12-15 ENCOUNTER — Ambulatory Visit (HOSPITAL_BASED_OUTPATIENT_CLINIC_OR_DEPARTMENT_OTHER): Admitting: Radiology

## 2023-12-15 DIAGNOSIS — K224 Dyskinesia of esophagus: Secondary | ICD-10-CM | POA: Diagnosis not present

## 2023-12-15 DIAGNOSIS — J8489 Other specified interstitial pulmonary diseases: Secondary | ICD-10-CM

## 2023-12-15 DIAGNOSIS — J849 Interstitial pulmonary disease, unspecified: Secondary | ICD-10-CM | POA: Diagnosis not present

## 2023-12-15 DIAGNOSIS — M25552 Pain in left hip: Secondary | ICD-10-CM | POA: Diagnosis not present

## 2023-12-15 DIAGNOSIS — Z981 Arthrodesis status: Secondary | ICD-10-CM | POA: Diagnosis not present

## 2023-12-15 DIAGNOSIS — R918 Other nonspecific abnormal finding of lung field: Secondary | ICD-10-CM | POA: Diagnosis not present

## 2023-12-15 NOTE — Telephone Encounter (Signed)
 Copied from CRM [id number]. Topic: Clinical - Request for Lab/Test Order >> Dec 15, 2023  9:21 AM Willma SAUNDERS wrote: Reason for CRM: Patient is requesting orders for an xray of her hip. States she was in the office on Tuesday and her hip pain on the left side was discussed. Would like to go to the [facility] on [address].  Patient can be reached at [phone]

## 2023-12-17 ENCOUNTER — Ambulatory Visit: Payer: Self-pay | Admitting: Family Medicine

## 2023-12-17 NOTE — Assessment & Plan Note (Signed)
 Stable. Return for labs.

## 2023-12-17 NOTE — Assessment & Plan Note (Signed)
 The current medical regimen is effective;  continue present plan and medications. Continue amlodipine , metoprolol , and lisinopril  as well as Lasix . Return for labs.

## 2023-12-17 NOTE — Assessment & Plan Note (Signed)
Stop hydroxyzine.

## 2023-12-17 NOTE — Assessment & Plan Note (Addendum)
 Patient to return for labs.  Currently on  repatha Continue to work on eating a healthy diet and exercise.  To return for Labs.

## 2023-12-17 NOTE — Assessment & Plan Note (Signed)
Recommend continue to work on eating healthy diet and exercise. Check A1c

## 2023-12-17 NOTE — Assessment & Plan Note (Signed)
 Check UA Given rocephin  1 gm IM Referred to urology.

## 2023-12-17 NOTE — Assessment & Plan Note (Signed)
 Continue Cymbalta  60 mg twice daily.  Start on Wellbutrin  xl 150 mg once daily in am.

## 2023-12-17 NOTE — Assessment & Plan Note (Signed)
 Stable. Continue Trelegy 1 inhalation daily. Recommend more frequent use of AfloVest.

## 2023-12-17 NOTE — Assessment & Plan Note (Signed)
 Management per specialist.

## 2023-12-25 ENCOUNTER — Encounter: Payer: Self-pay | Admitting: Family Medicine

## 2023-12-25 ENCOUNTER — Other Ambulatory Visit: Payer: Self-pay | Admitting: Family Medicine

## 2023-12-25 DIAGNOSIS — L989 Disorder of the skin and subcutaneous tissue, unspecified: Secondary | ICD-10-CM | POA: Diagnosis not present

## 2023-12-25 DIAGNOSIS — N390 Urinary tract infection, site not specified: Secondary | ICD-10-CM | POA: Diagnosis not present

## 2023-12-25 DIAGNOSIS — J069 Acute upper respiratory infection, unspecified: Secondary | ICD-10-CM | POA: Diagnosis not present

## 2023-12-25 MED ORDER — VALACYCLOVIR HCL 500 MG PO TABS
500.0000 mg | ORAL_TABLET | Freq: Three times a day (TID) | ORAL | 0 refills | Status: DC
Start: 2023-12-25 — End: 2024-01-12

## 2023-12-26 ENCOUNTER — Ambulatory Visit: Payer: Self-pay | Admitting: Adult Health

## 2023-12-27 ENCOUNTER — Encounter: Payer: Self-pay | Admitting: *Deleted

## 2024-01-01 ENCOUNTER — Ambulatory Visit (INDEPENDENT_AMBULATORY_CARE_PROVIDER_SITE_OTHER): Admitting: Family Medicine

## 2024-01-01 ENCOUNTER — Ambulatory Visit: Payer: Self-pay

## 2024-01-01 ENCOUNTER — Encounter: Payer: Self-pay | Admitting: Family Medicine

## 2024-01-01 VITALS — BP 120/74 | HR 83 | Temp 98.0°F | Resp 16 | Ht 65.0 in | Wt 149.8 lb

## 2024-01-01 DIAGNOSIS — F411 Generalized anxiety disorder: Secondary | ICD-10-CM | POA: Diagnosis not present

## 2024-01-01 DIAGNOSIS — R300 Dysuria: Secondary | ICD-10-CM | POA: Insufficient documentation

## 2024-01-01 DIAGNOSIS — B028 Zoster with other complications: Secondary | ICD-10-CM | POA: Diagnosis not present

## 2024-01-01 DIAGNOSIS — B029 Zoster without complications: Secondary | ICD-10-CM | POA: Insufficient documentation

## 2024-01-01 DIAGNOSIS — G4709 Other insomnia: Secondary | ICD-10-CM | POA: Insufficient documentation

## 2024-01-01 DIAGNOSIS — I119 Hypertensive heart disease without heart failure: Secondary | ICD-10-CM | POA: Diagnosis not present

## 2024-01-01 DIAGNOSIS — S81802A Unspecified open wound, left lower leg, initial encounter: Secondary | ICD-10-CM | POA: Diagnosis not present

## 2024-01-01 DIAGNOSIS — E782 Mixed hyperlipidemia: Secondary | ICD-10-CM | POA: Diagnosis not present

## 2024-01-01 DIAGNOSIS — S80852A Superficial foreign body, left lower leg, initial encounter: Secondary | ICD-10-CM | POA: Insufficient documentation

## 2024-01-01 LAB — POCT URINALYSIS DIP (CLINITEK)
Bilirubin, UA: NEGATIVE
Blood, UA: NEGATIVE
Glucose, UA: NEGATIVE mg/dL
Ketones, POC UA: NEGATIVE mg/dL
Leukocytes, UA: NEGATIVE
Nitrite, UA: NEGATIVE
POC PROTEIN,UA: NEGATIVE
Spec Grav, UA: 1.01 (ref 1.010–1.025)
Urobilinogen, UA: 0.2 U/dL
pH, UA: 6 (ref 5.0–8.0)

## 2024-01-01 MED ORDER — ACYCLOVIR 5 % EX OINT: 1.0000 | TOPICAL_OINTMENT | CUTANEOUS | 0 refills | Status: AC | PRN

## 2024-01-01 MED ORDER — PREDNISONE 20 MG PO TABS: ORAL_TABLET | ORAL | 0 refills | Status: AC

## 2024-01-01 MED ORDER — ALPRAZOLAM 0.25 MG PO TABS: 0.2500 mg | ORAL_TABLET | Freq: Every evening | ORAL | 0 refills | Status: AC | PRN

## 2024-01-01 NOTE — Assessment & Plan Note (Signed)
 Non-healing foot wound from trauma. Swollen and red. Monitor for infection signs such as fever or red streaks. - Consider antibiotics if infection signs develop.

## 2024-01-01 NOTE — Assessment & Plan Note (Signed)
 UA - negative Lab Results  Component Value Date   COLORU yellow 01/01/2024   CLARITYU clear 01/01/2024   GLUCOSEUR negative 01/01/2024   BILIRUBINUR negative 01/01/2024   KETONESU negative 12/15/2022   SPECGRAV 1.010 01/01/2024   RBCUR negative 01/01/2024   PHUR 6.0 01/01/2024   PROTEINUR =30 (A) 12/11/2023   UROBILINOGEN 0.2 01/01/2024   LEUKOCYTESUR Negative 01/01/2024   - Continue to drink plenty of fluids  - Culture ordered

## 2024-01-01 NOTE — Assessment & Plan Note (Signed)
 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 Increasingly large skin lesion on the forehead.  Pain persists despite Valtrex  and topical treatments. Prednisone  considered for symptom relief. Continued Valtrex  advised to prevent flare-ups. - Prescribe prednisone  taper for inflammation and pain management. - Prescribe Acyclovir  topical cream for application up to five times daily as needed. - Continue Valtrex  three times daily, ensuring medication supply is maintained. - Dermatologist appointment scheduled for skin lesion evaluation and possible biopsy.

## 2024-01-01 NOTE — Assessment & Plan Note (Signed)
 Well controlled Lab Results  Component Value Date   LDLCALC 119 (H) 08/29/2023    Currently on  repatha Continue to work on eating a healthy diet and exercise.  Labs drawn today

## 2024-01-01 NOTE — Assessment & Plan Note (Signed)
 Increased anxiety and insomnia due to life stressors and shingles coming back  - Continue Xanax  0.5 mg as needed    11/22/2023    4:16 PM 10/18/2023   11:42 AM 12/12/2022    3:30 PM 07/06/2022    9:43 AM  GAD 7 : Generalized Anxiety Score  Nervous, Anxious, on Edge 3 3 1 3   Control/stop worrying 3 3 1 3   Worry too much - different things 3 2 1 3   Trouble relaxing 3 3 1 3   Restless 3 3 0 3  Easily annoyed or irritable 3 3 0 3  Afraid - awful might happen 3 1 0 3  Total GAD 7 Score 21 18 4 21   Anxiety Difficulty Somewhat difficult Somewhat difficult Somewhat difficult Extremely difficult   - Continue Prozac  as prescribed

## 2024-01-01 NOTE — Assessment & Plan Note (Signed)
 Well controlled.  BP Readings from Last 3 Encounters:  01/01/24 120/74  12/12/23 136/72  12/11/23 (!) 150/84    No changes to medicines. Metoprolol  tartrate 50 mg twice daily, on lisinopril  5 mg daily. Continue to work on eating a healthy diet and exercise.  Labs drawn today.

## 2024-01-01 NOTE — Telephone Encounter (Signed)
 APPOINTMENT TODAY 01/01/2024 AT 11:20 AM WITH DINA JACOBS, NP.   FYI Only or Action Required?: FYI only for provider.  Patient was last seen in primary care on 12/12/2023 by Sherre Clapper, MD.  Called Nurse Triage reporting Dysuria and Rash.  Symptoms began x 2 weeks for urination pain and ongoing issue with possible shingles.  Interventions attempted: Rest, hydration, or home remedies and Other: been seen previously for both complaints.  Symptoms are: patient states urination pain is slightly better but shingles is worse.  Triage Disposition: See Physician Within 24 Hours  Patient/caregiver understands and will follow disposition?: Yes                       Copied from CRM [id number]. Topic: Clinical - Red Word Triage >> Jan 01, 2024  8:32 AM Treva T wrote: Red Word that prompted transfer to Nurse Triage: Patient states she is having kidney issues, pain with urination, also patient states she has painful lesions from previous shingles outbreak. Reason for Disposition  SEVERE pain (e.g., excruciating)  Answer Assessment - Initial Assessment Questions 1. SEVERITY: How bad is the pain?  (e.g., Scale 1-10; mild, moderate, or severe)     Not bad now 2. FREQUENCY: How many times have you had painful urination today?      ---- 3. PATTERN: Is pain present every time you urinate or just sometimes?      ---- 4. ONSET: When did the painful urination start?      About two weeks 5. FEVER: Do you have a fever? If Yes, ask: What is your temperature, how was it measured, and when did it start?     No 6. PAST UTI: Have you had a urine infection before? If Yes, ask: When was the last time? and What happened that time?      yes 7. CAUSE: What do you think is causing the painful urination?  (e.g., UTI, scratch, Herpes sore)     ---- 8. OTHER SYMPTOMS: Do you have any other symptoms? (e.g., blood in urine, flank pain, genital sores, urgency, vaginal  discharge)     No  Answer Assessment - Initial Assessment Questions 1. APPEARANCE of RASH: What does the rash look like?      Previous shingles as seen/treated by PCP Dr Sherre 2. LOCATION: Where is the rash located?      Race and head 3. ONSET: When did the rash start?      Patient states September and her pcp dr cox has treated her for this before but it isnt going away 4. ITCHING: Does the rash itch? If Yes, ask: How bad is the itch?  (Scale 1-10; or mild, moderate, severe)     ----- 5. PAIN: Does the rash hurt? If Yes, ask: How bad is the pain?  (Scale 0-10; or none, mild, moderate, severe)     Yes  6. OTHER SYMPTOMS: Do you have any other symptoms? (e.g., fever)     Pain with urination  Protocols used: Urination Pain - Female-A-AH, Shingles (Zoster)-A-AH

## 2024-01-01 NOTE — Progress Notes (Signed)
 Acute Office Visit  Subjective:    Patient ID: Courtney [name], female    DOB: [birth year], 78 y.o.   MRN: [id number]  Chief Complaint  Patient presents with   Urinary Tract Infection    Discussed the use of AI scribe software for clinical note transcription with the patient, who gave verbal consent to proceed.  History of Present Illness   The patient is a 78 year old with shingles who presents with worsening skin lesions to her face and scalp, injury to her left leg, dysuria and management of chronic issues.  Shingles outbreak sequela:  The patient has been experiencing worsening skin lesions and pain associated with shingles since September of the previous year. The lesions are primarily located on the left side of the head and feel like 'one big scab' that is painful and burns, especially when exposed to sunlight. They had to leave the beach early due to the pain and have been wearing a bandage over the lesions because they are 'just getting bigger'.  They have been taking Valtrex  three times a day. They have used various creams, including calamine lotion, which caused itching. They express concern about the lesions not healing well.  They have a history of receiving shingles vaccines, mentioning that they received two doses this time, compared to one in 2012.   Left leg injury: They report a recent injury to their lower left leg, which occurred five days ago at the beach when they hit it on a metal rod. The injury resulted in swelling and redness, and they are concerned about the slow healing process. They mention that they used to heal quickly but now feel that they do not heal as well as before. They have not had a fever associated with their leg injury.   Dysuria: Patient starting having symptoms of urgency, frequency and pain with urination. She has a history of UTI's and wants to have her urine checked. She was camping at the beach recently and had to come home early due to not  feeling well.   Anxiety: They are currently taking Xanax  and express a need for a refill. They also mention having a pain doctor and using pain medication, which they found helpful during their time at the beach. They are concerned about their overall health, feeling 'like I'm a hundred and seven' despite being 78, and note that they have not been feeling well recently.  Hyperlipidemia: Currently takes Repatha injection 140 mg every 14 days. No complications. Eats well and tries to exercise when able.   Hypothyroidism:  Currently taking synthroid  75 mcg by mouth once daily. No side effects. She is compliant with medication. Reports being cold at times.   Hypertension:  Taking lisinopril  2.5 mg, lasix  20 mg, aspirin 81 mg and amlodipine  5 mg once daily. She also takes lopressor  50 mg TWICE A DAY. Denies chest pain or shortness of breath.       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 07/25/2014   Followed in Pulmonary clinic/ [facility]/ [place]    Cardiac dysrhythmia    Chronic idiopathic constipation    Colitis, acute 02/08/2021   Coronary artery disease    Depression, major, recurrent, moderate (HCC)    Headache    Heart murmur    History of bladder infections    History of COVID-19    Hyperlipidemia  Hypertension    Hypothyroidism    Knee pain, bilateral    Melena 02/08/2021   Osteoarthritis    Pneumonia    Primary insomnia    Recovering alcoholic (HCC)    SVT (supraventricular tachycardia) (HCC)    UTI (urinary tract infection)    Varicose veins    Vitamin B12 deficiency     Past Surgical History:  Procedure Laterality Date   ABDOMINAL HYSTERECTOMY  1991   APPENDECTOMY     BACK SURGERY     had 2 surgeries. Have rods placed in back    BREAST BIOPSY Left    x2   BREAST ENHANCEMENT SURGERY  2006   CARDIAC CATHETERIZATION  12/22/2020   stents placed    CHOLECYSTECTOMY  12/2019   removed lap band.    COLONOSCOPY  12/01/2016   Moderate predominantly sigmod diverticulosis. Otherwise grossly normal colonoscopy   EYE SURGERY     IOL- bilateral - [place]   KNEE ARTHROSCOPY Left    lap band surgery  1981   LUMBAR FUSION  07/29/2015   posterior level one   removal of cervical disc fragments  10/2007   pt. denies    TUBAL LIGATION  1970    Family History  Problem Relation Age of Onset   Heart disease Mother    Heart disease Father    Diabetes Sister    Other Sister        BRAIN TUMOR   Heart disease Brother    Heart disease Brother    Heart disease Brother    Heart disease Brother    Heart disease Brother    Colon cancer Maternal Aunt    Colon cancer Cousin     Social History   Socioeconomic History   Marital status: Widowed    Spouse name: Not on file   Number of children: 2   Years of education: Not on file   Highest education level: Not on file  Occupational History   Occupation: retired    Comment: nurse  Tobacco Use   Smoking status: Never   Smokeless tobacco: Never  Vaping Use   Vaping status: Never Used  Substance and Sexual Activity   Alcohol use: No    Alcohol/week: 0.0 standard drinks of alcohol    Comment: recovery x 20 yrs   Drug use: No   Sexual activity: Not Currently    Comment: MARRIED  Other Topics Concern   Not on file  Social History Narrative   Not on file   Social Drivers of Health   Financial Resource Strain: Low Risk  (12/12/2022)   Overall Financial Resource Strain (CARDIA)    Difficulty of Paying Living Expenses: Not hard at all  Food Insecurity: No Food Insecurity (10/18/2023)   Hunger Vital Sign    Worried About Running Out of Food in the Last Year: Never true    Ran Out of Food in the Last Year: Never true  Transportation Needs: No Transportation Needs (10/18/2023)   PRAPARE - Administrator, Civil Service (Medical): No    Lack of Transportation (Non-Medical): No   Physical Activity: Inactive (12/12/2022)   Exercise Vital Sign    Days of Exercise per Week: 0 days    Minutes of Exercise per Session: 0 min  Stress: No Stress Concern Present (12/12/2022)   [employer] of Occupational Health - Occupational Stress Questionnaire    Feeling of Stress : Not at all  Social Connections: Moderately Isolated (12/12/2022)   Social  Connection and Isolation Panel    Frequency of Communication with Friends and Family: Three times a week    Frequency of Social Gatherings with Friends and Family: Three times a week    Attends Religious Services: More than 4 times per year    Active Member of Clubs or Organizations: No    Attends Banker Meetings: Never    Marital Status: Widowed  Intimate Partner Violence: Not At Risk (10/18/2023)   Humiliation, Afraid, Rape, and Kick questionnaire    Fear of Current or Ex-Partner: No    Emotionally Abused: No    Physically Abused: No    Sexually Abused: No    Outpatient Medications Prior to Visit  Medication Sig Dispense Refill   albuterol  (VENTOLIN  HFA) 108 (90 Base) MCG/ACT inhaler Inhale 2 puffs into the lungs every 6 (six) hours as needed for wheezing or shortness of breath. 8 g 2   amLODipine  (NORVASC ) 5 MG tablet Take 1 tablet (5 mg total) by mouth daily. 90 tablet 0   Ascorbic Acid (VITAMIN C) 1000 MG tablet Take 1,000 mg by mouth daily.     aspirin EC 81 MG tablet Take 81 mg by mouth daily. Swallow whole.     buPROPion  (WELLBUTRIN  XL) 150 MG 24 hr tablet Take 1 tablet (150 mg total) by mouth daily. 30 tablet 0   Cholecalciferol (D3 PO) Take 2 tablets by mouth daily. chewable     DULoxetine  (CYMBALTA ) 60 MG capsule TAKE 1 CAPSULE(60 MG TOTAL) BY MOUTH TWICE DAILY 180 capsule 0   Fluticasone -Umeclidin-Vilant (TRELEGY ELLIPTA ) 100-62.5-25 MCG/ACT AEPB Inhale 1 puff into the lungs daily. 1 each 11   furosemide  (LASIX ) 20 MG tablet Take 20 mg by mouth daily.     levothyroxine  (SYNTHROID ) 75 MCG tablet TAKE  1 TABLET(75 MCG) BY MOUTH DAILY 90 tablet 1   lidocaine  (XYLOCAINE ) 5 % ointment Apply 1 Application topically as needed. 35.44 g 0   lidocaine -prilocaine  (EMLA ) cream Apply 1 Application topically 4 (four) times daily as needed (pain). 30 g 0   lisinopril  (ZESTRIL ) 2.5 MG tablet Take 2.5 mg by mouth daily.     meloxicam (MOBIC) 15 MG tablet Take 15 mg by mouth daily.     metoprolol  tartrate (LOPRESSOR ) 50 MG tablet TAKE 1 TABLET(50 MG) BY MOUTH TWICE DAILY WITH MEALS 180 tablet 1   montelukast  (SINGULAIR ) 10 MG tablet Take 10 mg by mouth daily as needed (allergies).     MOUNJARO  12.5 MG/0.5ML Pen ADMINISTER 12.5 MG UNDER THE SKIN 1 TIME A WEEK FOR 16 DOSES 2 mL 3   mupirocin ointment (BACTROBAN) 2 % Apply 1 Application topically 2 (two) times daily.     naloxone (NARCAN) nasal spray 4 mg/0.1 mL Place 1 spray into the nose once. In case of opioid overdose.     nitroGLYCERIN  (NITROSTAT ) 0.4 MG SL tablet DISSOLVE ONE TABLET UNDER TONGUE AS NEEDED FOR CHEST PAIN EVERY 5 MINUTES FOR UP TO 3 DOSES 25 tablet 1   omeprazole  (PRILOSEC) 40 MG capsule Take 40 mg by mouth daily as needed (reflux).     oxyCODONE -acetaminophen  (PERCOCET) 10-325 MG tablet Take 1 tablet by mouth See admin instructions. Take 1 tablet by mouth 5 times a day. May skip a dose if not needed.     REPATHA SURECLICK 140 MG/ML SOAJ Inject 140 mg into the skin every 14 (fourteen) days.     tiZANidine  (ZANAFLEX ) 4 MG tablet Take 4 mg by mouth at bedtime as needed for muscle spasms.  tretinoin (RETIN-A) 0.025 % gel Apply 1 Application topically at bedtime.     triamcinolone  (KENALOG ) 0.025 % ointment Apply 1 Application topically 2 (two) times daily.     valACYclovir  (VALTREX ) 500 MG tablet Take 1 tablet (500 mg total) by mouth 3 (three) times daily. 21 tablet 0   ALPRAZolam  (XANAX ) 0.25 MG tablet Take 1 tablet (0.25 mg total) by mouth at bedtime as needed for anxiety. 10 tablet 0   MAGNESIUM PO Take 1 tablet by mouth daily. chewable      ondansetron  (ZOFRAN -ODT) 8 MG disintegrating tablet Take 8 mg by mouth every 8 (eight) hours as needed for nausea.     collagenase  (SANTYL ) 250 UNIT/GM ointment Apply 1 Application topically daily. 15 g 1   phenazopyridine  (PYRIDIUM ) 200 MG tablet Take 1 tablet (200 mg total) by mouth 3 (three) times daily. 6 tablet 0   RESTASIS 0.05 % ophthalmic emulsion Place 1 drop into both eyes 2 (two) times daily as needed (dryness).     No facility-administered medications prior to visit.    Allergies  Allergen Reactions   Levofloxacin Hives, Shortness Of Breath, Swelling and Other (See Comments)    Tolerates Ciprofloxacin  RASH IN MOUTH   (can take IV route)   Bactrim  [Sulfamethoxazole -Trimethoprim ] Hives and Itching   Clarithromycin      GI upset Other reaction(s): GI Upset (intolerance) GI upset -can take but does cause mild upset    Lyrica [Pregabalin]    Nexletol [Bempedoic Acid]     Muscle pain   Statins Other (See Comments)    Myalgias   Zetia  [Ezetimibe ]     Muscle pain    Review of Systems  Constitutional:  Negative for chills, diaphoresis, fatigue and fever.  HENT:  Negative for congestion, ear pain and sinus pain.   Eyes: Negative.   Respiratory:  Negative for cough and shortness of breath.   Cardiovascular:  Negative for chest pain.  Gastrointestinal:  Negative for abdominal pain, constipation, nausea and vomiting.  Endocrine: Negative.   Genitourinary:  Positive for dysuria, frequency and urgency.  Musculoskeletal:  Negative for arthralgias.  Skin:  Positive for wound (left leg).       Shingles outbreak on face and scalp  Allergic/Immunologic: Negative.   Neurological:  Negative for weakness and headaches.  Psychiatric/Behavioral:  Negative for dysphoric mood. The patient is not nervous/anxious.        Objective:        01/01/2024   11:20 AM 12/12/2023    9:52 AM 12/11/2023   10:25 AM  Vitals with BMI  Height 5' 5 5' 5   Weight 149 lbs 13 oz 155 lbs   BMI  24.93 25.79   Systolic 120 136 849  Diastolic 74 72 84  Pulse 83 86 64    No data found.   Physical Exam Vitals reviewed.  Constitutional:      General: She is not in acute distress.    Appearance: Normal appearance. She is not ill-appearing.  Eyes:     Conjunctiva/sclera: Conjunctivae normal.  Cardiovascular:     Rate and Rhythm: Normal rate and regular rhythm.     Heart sounds: Normal heart sounds. No murmur heard. Pulmonary:     Effort: Pulmonary effort is normal.     Breath sounds: Normal breath sounds. No wheezing.  Abdominal:     General: Bowel sounds are normal.     Palpations: Abdomen is soft.  Musculoskeletal:        General: Normal range of  motion.  Skin:    Findings: Lesion (face and scalp) and wound (left leg) present.     Comments: See photos  Neurological:     Mental Status: She is alert. Mental status is at baseline.  Psychiatric:        Mood and Affect: Mood normal.        Behavior: Behavior normal.       Health Maintenance Due  Topic Date Due   Colonoscopy  12/01/2021   Medicare Annual Wellness (AWV)  04/29/2023   Zoster Vaccines- Shingrix (2 of 2) 12/05/2023    There are no preventive care reminders to display for this patient.   Lab Results  Component Value Date   TSH 2.110 03/02/2023   Lab Results  Component Value Date   WBC 8.3 08/29/2023   HGB 11.9 08/29/2023   HCT 35.8 08/29/2023   MCV 95 08/29/2023   PLT 283 08/29/2023   Lab Results  Component Value Date   NA 132 (L) 08/29/2023   K 4.7 08/29/2023   CO2 24 08/29/2023   GLUCOSE 52 (L) 08/29/2023   BUN 21 08/29/2023   CREATININE 1.20 (H) 08/29/2023   BILITOT <0.2 08/29/2023   ALKPHOS 154 (H) 08/29/2023   AST 20 08/29/2023   ALT 13 08/29/2023   PROT 7.1 08/29/2023   ALBUMIN 4.2 08/29/2023   CALCIUM 9.5 08/29/2023   ANIONGAP 11 06/11/2023   EGFR 47 (L) 08/29/2023   Lab Results  Component Value Date   CHOL 202 (H) 08/29/2023   Lab Results  Component Value Date    HDL 48 08/29/2023   Lab Results  Component Value Date   LDLCALC 119 (H) 08/29/2023   Lab Results  Component Value Date   TRIG 202 (H) 08/29/2023   Lab Results  Component Value Date   CHOLHDL 4.2 08/29/2023   Lab Results  Component Value Date   HGBA1C 5.6 12/12/2022       Assessment & Plan:  Herpes zoster with other complication Assessment & Plan: 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 Increasingly large skin lesion on the forehead.  Pain persists despite Valtrex  and topical treatments. Prednisone  considered for symptom relief. Continued Valtrex  advised to prevent flare-ups. - Prescribe prednisone  taper for inflammation and pain management. - Prescribe Acyclovir  topical cream for application up to five times daily as needed. - Continue Valtrex  three times daily, ensuring medication supply is maintained. - Dermatologist appointment scheduled for skin lesion evaluation and possible biopsy.  Orders: -     predniSONE ; Take 3 tablets (60 mg total) by mouth daily with breakfast for 3 days, THEN 2 tablets (40 mg total) daily with breakfast for 3 days, THEN 1 tablet (20 mg total) daily with breakfast for 3 days.  Dispense: 18 tablet; Refill: 0 -     Acyclovir ; Apply 1 Application topically every 4 (four) hours as needed.  Dispense: 30 g; Refill: 0  Leg wound, left, initial encounter Assessment & Plan: Non-healing foot wound from trauma. Swollen and red. Monitor for infection signs such as fever or red streaks. - Consider antibiotics if infection signs develop.   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  BP Readings from Last 3 Encounters:  01/01/24 120/74  12/12/23 136/72  12/11/23 (!) 150/84    No changes to medicines. Metoprolol  tartrate 50 mg twice daily, on lisinopril  5 mg daily. Continue to work on eating a healthy diet and exercise.  Labs drawn today.    Orders: -     CBC with Differential/Platelet -     Comprehensive metabolic panel with GFR  Mixed hyperlipidemia Assessment & Plan: Well controlled Lab Results  Component Value Date   LDLCALC 119 (H) 08/29/2023    Currently on  repatha Continue to work on eating a healthy diet and exercise.  Labs drawn today    Orders: -     TSH -     Lipid panel  GAD (generalized anxiety disorder) Assessment & Plan: Increased anxiety and insomnia due to life stressors and shingles coming back  - Continue Xanax  0.5 mg as needed    11/22/2023    4:16 PM 10/18/2023   11:42 AM 12/12/2022    3:30 PM 07/06/2022    9:43 AM  GAD 7 : Generalized Anxiety Score  Nervous, Anxious, on Edge 3 3 1 3   Control/stop worrying 3 3 1 3   Worry too much - different things 3 2 1 3   Trouble relaxing 3 3 1 3   Restless 3 3 0 3  Easily annoyed or irritable 3 3 0 3  Afraid - awful might happen 3 1 0 3  Total GAD 7 Score 21 18 4 21   Anxiety Difficulty Somewhat difficult Somewhat difficult Somewhat difficult Extremely difficult   - Continue Prozac  as prescribed  Orders: -     ALPRAZolam ; Take 1 tablet (0.25 mg total) by mouth at bedtime as needed for anxiety.  Dispense: 10 tablet; Refill: 0           Meds ordered this encounter  Medications   ALPRAZolam  (XANAX ) 0.25 MG tablet    Sig: Take 1 tablet (0.25 mg total) by mouth at bedtime as needed for anxiety.    Dispense:  10 tablet    Refill:  0   predniSONE  (DELTASONE ) 20  MG tablet    Sig: Take 3 tablets (60 mg total) by mouth daily with breakfast for 3 days, THEN 2 tablets (40 mg total) daily with breakfast for 3 days, THEN 1 tablet (20 mg total) daily with breakfast for 3 days.    Dispense:  18 tablet    Refill:  0   acyclovir  ointment (ZOVIRAX ) 5 %    Sig: Apply 1 Application topically every 4 (four) hours as needed.    Dispense:  30 g    Refill:  0    Orders Placed This Encounter  Procedures   Urine Culture   CBC with Differential   Comprehensive metabolic panel with GFR   TSH   Lipid Panel   POCT URINALYSIS DIP (CLINITEK)     Follow-up: Return in about 3 months (around 04/02/2024) for chronic.  An After Visit Summary was printed and given to the patient.  Total time spent on today's visit was 45 minutes, including both face-to-face time and nonface-to-face time personally spent on review of chart (labs), discussing labs and goals, discussing further work-up, treatment options, referral  to dermatologist, answering patient's questions, and coordinating care.    Harrie CHRISTELLA Cedar, FNP [name] [phone]

## 2024-01-01 NOTE — Progress Notes (Signed)
 Called and spoke with patient, advised of results/recommendations per Courtney Stank NP.  She verbalized understanding.  She states she has had her PFTs done.  Scheduled patient to see Dr. Theophilus on 01/29/24 at 1:30 pm, to arrive at 1:15 pm for check in.  Nothing further needed.

## 2024-01-02 ENCOUNTER — Telehealth: Payer: Self-pay

## 2024-01-02 ENCOUNTER — Other Ambulatory Visit

## 2024-01-02 ENCOUNTER — Ambulatory Visit: Payer: Self-pay | Admitting: Family Medicine

## 2024-01-02 LAB — CBC WITH DIFFERENTIAL/PLATELET
Basophils Absolute: 0.1 x10E3/uL (ref 0.0–0.2)
Basos: 1 %
EOS (ABSOLUTE): 0.8 x10E3/uL — ABNORMAL HIGH (ref 0.0–0.4)
Eos: 10 %
Hematocrit: 37.8 % (ref 34.0–46.6)
Hemoglobin: 12.5 g/dL (ref 11.1–15.9)
Immature Grans (Abs): 0 x10E3/uL (ref 0.0–0.1)
Immature Granulocytes: 0 %
Lymphocytes Absolute: 2.1 x10E3/uL (ref 0.7–3.1)
Lymphs: 29 %
MCH: 31.6 pg (ref 26.6–33.0)
MCHC: 33.1 g/dL (ref 31.5–35.7)
MCV: 96 fL (ref 79–97)
Monocytes Absolute: 0.8 x10E3/uL (ref 0.1–0.9)
Monocytes: 10 %
Neutrophils Absolute: 3.7 x10E3/uL (ref 1.4–7.0)
Neutrophils: 50 %
Platelets: 298 x10E3/uL (ref 150–450)
RBC: 3.96 x10E6/uL (ref 3.77–5.28)
RDW: 12.6 % (ref 11.7–15.4)
WBC: 7.5 x10E3/uL (ref 3.4–10.8)

## 2024-01-02 LAB — COMPREHENSIVE METABOLIC PANEL WITH GFR
ALT: 16 IU/L (ref 0–32)
AST: 22 IU/L (ref 0–40)
Albumin: 4.4 g/dL (ref 3.8–4.8)
Alkaline Phosphatase: 129 IU/L — ABNORMAL HIGH (ref 44–121)
BUN/Creatinine Ratio: 15 (ref 12–28)
BUN: 17 mg/dL (ref 8–27)
Bilirubin Total: 0.3 mg/dL (ref 0.0–1.2)
CO2: 23 mmol/L (ref 20–29)
Calcium: 10.2 mg/dL (ref 8.7–10.3)
Chloride: 95 mmol/L — ABNORMAL LOW (ref 96–106)
Creatinine, Ser: 1.17 mg/dL — ABNORMAL HIGH (ref 0.57–1.00)
Globulin, Total: 2.9 g/dL (ref 1.5–4.5)
Glucose: 93 mg/dL (ref 70–99)
Potassium: 4.8 mmol/L (ref 3.5–5.2)
Sodium: 131 mmol/L — ABNORMAL LOW (ref 134–144)
Total Protein: 7.3 g/dL (ref 6.0–8.5)
eGFR: 48 mL/min/1.73 — ABNORMAL LOW (ref >59)

## 2024-01-02 LAB — TSH: TSH: 2.58 u[IU]/mL (ref 0.450–4.500)

## 2024-01-02 LAB — LIPID PANEL
Chol/HDL Ratio: 3.9 ratio (ref 0.0–4.4)
Cholesterol, Total: 215 mg/dL — ABNORMAL HIGH (ref 100–199)
HDL: 55 mg/dL (ref >39)
LDL Chol Calc (NIH): 139 mg/dL — ABNORMAL HIGH (ref 0–99)
Triglycerides: 119 mg/dL (ref 0–149)
VLDL Cholesterol Cal: 21 mg/dL (ref 5–40)

## 2024-01-02 NOTE — Telephone Encounter (Signed)
 PA submitted and denied via covermymeds for acyclovir  ointment.

## 2024-01-03 DIAGNOSIS — D485 Neoplasm of uncertain behavior of skin: Secondary | ICD-10-CM | POA: Diagnosis not present

## 2024-01-03 DIAGNOSIS — B029 Zoster without complications: Secondary | ICD-10-CM | POA: Diagnosis not present

## 2024-01-03 DIAGNOSIS — L299 Pruritus, unspecified: Secondary | ICD-10-CM | POA: Diagnosis not present

## 2024-01-03 DIAGNOSIS — B009 Herpesviral infection, unspecified: Secondary | ICD-10-CM | POA: Diagnosis not present

## 2024-01-04 ENCOUNTER — Other Ambulatory Visit: Payer: Self-pay | Admitting: Family Medicine

## 2024-01-04 ENCOUNTER — Ambulatory Visit

## 2024-01-05 ENCOUNTER — Ambulatory Visit: Payer: Self-pay

## 2024-01-05 ENCOUNTER — Other Ambulatory Visit: Payer: Self-pay

## 2024-01-05 MED ORDER — FLUCONAZOLE 150 MG PO TABS: 150.0000 mg | ORAL_TABLET | Freq: Once | ORAL | 1 refills | Status: AC

## 2024-01-05 NOTE — Telephone Encounter (Signed)
 FYI Only or Action Required?: Action required by provider: medication request for oral thrush.  Patient was last seen in primary care on 01/01/2024 by Teressa Harrie HERO, FNP.  Called Nurse Triage reporting Requesting med for oral thrush.  Symptoms began yesterday.  Interventions attempted: Other: oral hygiene .  Symptoms are: gradually worsening.  Triage Disposition: See PCP When Office is Open (Within 3 Days)  Patient/caregiver understands and will follow disposition?: No, wishes to speak with PCP   Copied from CRM [id number]. Topic: Clinical - Medication Question >> Jan 05, 2024 11:40 AM Montie POUR wrote: Reason for CRM:  She has thrush in the her mouth. She would like Dr. Sherre to call her in something to [facility]. Please send her a message through MyChart if can or cannot. Reason for Disposition  [1] White patches that stick to tongue or inner cheek AND [2] can be wiped off  Answer Assessment - Initial Assessment Questions 1. SYMPTOM: What's the main symptom you're concerned about? (e.g., chapped lips, dry mouth, lump, sores)     Thrush cheeks, tongue, lip 2. ONSET: When did the  symptoms  start?     yesterday 3. PAIN: Is there any pain? If Yes, ask: How bad is it? (Scale: 0-10; or none, mild, moderate, severe)     Mild  4. CAUSE: What do you think is causing the symptoms?     Thrush  5. OTHER SYMPTOMS: Do you have any other symptoms? (e.g., fever, sore throat, toothache, swelling)     Denies   Additional info: She has had thrush in the past and this is the same. She had been prescribed a mouth wash in the past and is requesting this before the weekend.   No appointments are available in office. Patient requesting Dr. Sherre call in prescription for oral thrush. Patient requests communication back either way and requests response via MyChart message.  Protocols used: Mouth Symptoms-A-AH

## 2024-01-05 NOTE — Telephone Encounter (Signed)
 Done

## 2024-01-09 ENCOUNTER — Encounter: Payer: Self-pay | Admitting: Family Medicine

## 2024-01-09 DIAGNOSIS — F331 Major depressive disorder, recurrent, moderate: Secondary | ICD-10-CM

## 2024-01-09 MED ORDER — BUPROPION HCL ER (XL) 300 MG PO TB24
300.0000 mg | ORAL_TABLET | Freq: Every day | ORAL | 0 refills | Status: DC
Start: 2024-01-09 — End: 2024-02-13

## 2024-01-11 DIAGNOSIS — B029 Zoster without complications: Secondary | ICD-10-CM | POA: Diagnosis not present

## 2024-01-11 DIAGNOSIS — R531 Weakness: Secondary | ICD-10-CM | POA: Diagnosis not present

## 2024-01-11 NOTE — Progress Notes (Signed)
 Subjective:  Patient ID: Courtney [name], female    DOB: 09/07/1945  Age: 78 y.o. MRN: [id number]  Chief Complaint  Patient presents with   Medical Management of Chronic Issues    Discussed the use of AI scribe software for clinical note transcription with the patient, who gave verbal consent to proceed.  History of Present Illness   Courtney [name] is a 78 year old female with recurrent shingles who presents with a new outbreak of shingles.  Herpetic rash and pruritus - Recurrent shingles with a new outbreak characterized by new sores and blisters - Currently taking valacyclovir  (Valtrex ) 1000 mg three times daily for seven days - Biopsy of affected areas performed last Friday; awaiting results - Intermittent pruritus managed with hydroxyzine  as needed, though hydroxyzine  sometimes causes altered mental status described as feeling 'crazy'  Depressive symptoms - History of depression - Previously treated with duloxetine  (Cymbalta ) 60 mg twice daily, which was ineffective - Increased bupropion  XL (Wellbutrin ) 300 mg once daily, with significant improvement in mood - Discontinued Cymbalta  after running out on Monday  Chronic pain and easy bruising - Chronic pain previously managed with meloxicam, which provided significant relief - Easy bruising, with minor trauma (e.g., contact with her dog) resulting in bruises  Non-healing wound - Non-healing wound present on the left foot  Hyperlipidemia management - Cholesterol levels improved with evolocumab (Repatha) administered every two weeks  Respiratory congestion - Persistent congestion without sore throat, stuffy nose, or pulmonary symptoms - Uses a respiratory therapy vest, which is heavy and cumbersome      Hyperlipidemia: Patient is on Repatha 140 mg once every 2 weeks.   Hypertension: Blood pressure is well-controlled.  Patient is on amlodipine , metoprolol , and lisinopril  as well as Lasix . Patient has known coronary artery  disease and these are treating this as well.  Patient has nitroglycerin  but rarely has to use it.   Patient has chronic back pain and they are considering a stimulator.  She has time appoint with psychiatry first to be cleared.  She is currently on Percocet 10/325 mg 1 5 times a day as well as managing 4 mg at bedtime.  Patient has Narcan in the house in case of accidental overdose.  And she is discussed with her daughter where it is located.   Prediabetes/weight management: Patient is on Mounjaro  12.5 mg weekly.   Bronchiectasis: Patient is on Trelegy 1 inhalation daily and has a AfloVest which she uses when she feels like her chest is getting tight.  Patient has reflux and is currently on omeprazole  40 mg once daily as needed.  Back pain is doing much better on meloxicam, but this is contraindicated with her CKD. Has percocet from the pain clinic, but it does not work as well as meloxicam. Patient  takes tizanidine .  Patient has failed on lidoderm  patches, tylenol , gabapentin , and lyrica.      11/22/2023    4:16 PM 10/18/2023   11:41 AM 09/28/2023   11:01 AM 07/14/2023   10:11 AM 03/20/2023   11:25 AM  Depression screen PHQ 2/9  Decreased Interest 3 3 0 0 3  Down, Depressed, Hopeless 3 2 0 0 3  PHQ - 2 Score 6 5 0 0 6  Altered sleeping 2 3   3   Tired, decreased energy 3 3   3   Change in appetite 3 0   0  Feeling bad or failure about yourself  3 1   0  Trouble concentrating 2 1  1  Moving slowly or fidgety/restless 2 0   0  Suicidal thoughts 0 0   0  PHQ-9 Score 21 13   13   Difficult doing work/chores Very difficult Somewhat difficult   Somewhat difficult        11/22/2023    4:16 PM  Fall Risk   Falls in the past year? 0  Number falls in past yr: 0  Injury with Fall? 0  Risk for fall due to : No Fall Risks  Follow up Falls evaluation completed    Patient Care Team: Sherre Clapper, MD as PCP - General (Internal Medicine) Dann Candyce RAMAN, MD as PCP - Cardiology  (Cardiology) McGukin, Lynwood FABIENE Raddle., MD (Cardiology) [name], [name], MD as Consulting Physician (Pulmonary Disease) Nyle Rankin POUR, [facility] (Inactive) (Pharmacist)   Review of Systems  Constitutional:  Negative for chills, fatigue and fever.  HENT:  Positive for congestion. Negative for ear pain and sore throat.   Respiratory:  Negative for cough and shortness of breath.   Cardiovascular:  Negative for chest pain.  Gastrointestinal:  Negative for abdominal pain, constipation, diarrhea, nausea and vomiting.  Genitourinary:  Negative for dysuria and urgency.  Musculoskeletal:  Positive for back pain. Negative for arthralgias and myalgias.  Skin:  Negative for rash.  Neurological:  Negative for dizziness and headaches.  Psychiatric/Behavioral:  Negative for dysphoric mood. The patient is not nervous/anxious.     Current Outpatient Medications on File Prior to Visit  Medication Sig Dispense Refill   acyclovir  ointment (ZOVIRAX ) 5 % Apply 1 Application topically every 4 (four) hours as needed. 30 g 0   albuterol  (VENTOLIN  HFA) 108 (90 Base) MCG/ACT inhaler Inhale 2 puffs into the lungs every 6 (six) hours as needed for wheezing or shortness of breath. 8 g 2   ALPRAZolam  (XANAX ) 0.25 MG tablet Take 1 tablet (0.25 mg total) by mouth at bedtime as needed for anxiety. 10 tablet 0   amLODipine  (NORVASC ) 5 MG tablet Take 1 tablet (5 mg total) by mouth daily. 90 tablet 0   Ascorbic Acid (VITAMIN C) 1000 MG tablet Take 1,000 mg by mouth daily.     aspirin EC 81 MG tablet Take 81 mg by mouth daily. Swallow whole.     buPROPion  (WELLBUTRIN  XL) 300 MG 24 hr tablet Take 1 tablet (300 mg total) by mouth daily. 90 tablet 0   Cholecalciferol (D3 PO) Take 2 tablets by mouth daily. chewable     Fluticasone -Umeclidin-Vilant (TRELEGY ELLIPTA ) 100-62.5-25 MCG/ACT AEPB Inhale 1 puff into the lungs daily. 1 each 11   furosemide  (LASIX ) 20 MG tablet Take 20 mg by mouth daily.     levothyroxine  (SYNTHROID ) 75 MCG  tablet TAKE 1 TABLET(75 MCG) BY MOUTH DAILY 90 tablet 1   lidocaine  (XYLOCAINE ) 5 % ointment Apply 1 Application topically as needed. 35.44 g 0   lidocaine -prilocaine  (EMLA ) cream Apply 1 Application topically 4 (four) times daily as needed (pain). 30 g 0   lisinopril  (ZESTRIL ) 2.5 MG tablet Take 2.5 mg by mouth daily.     metoprolol  tartrate (LOPRESSOR ) 50 MG tablet TAKE 1 TABLET(50 MG) BY MOUTH TWICE DAILY WITH MEALS 180 tablet 1   montelukast  (SINGULAIR ) 10 MG tablet Take 10 mg by mouth daily as needed (allergies).     MOUNJARO  12.5 MG/0.5ML Pen ADMINISTER 12.5 MG UNDER THE SKIN 1 TIME A WEEK FOR 16 DOSES 2 mL 3   mupirocin ointment (BACTROBAN) 2 % Apply 1 Application topically 2 (two) times daily.  naloxone (NARCAN) nasal spray 4 mg/0.1 mL Place 1 spray into the nose once. In case of opioid overdose.     nitroGLYCERIN  (NITROSTAT ) 0.4 MG SL tablet DISSOLVE ONE TABLET UNDER TONGUE AS NEEDED FOR CHEST PAIN EVERY 5 MINUTES FOR UP TO 3 DOSES 25 tablet 1   omeprazole  (PRILOSEC) 40 MG capsule Take 40 mg by mouth daily as needed (reflux).     oxyCODONE -acetaminophen  (PERCOCET) 10-325 MG tablet Take 1 tablet by mouth See admin instructions. Take 1 tablet by mouth 5 times a day. May skip a dose if not needed.     REPATHA SURECLICK 140 MG/ML SOAJ Inject 140 mg into the skin every 14 (fourteen) days.     tiZANidine  (ZANAFLEX ) 4 MG tablet Take 4 mg by mouth at bedtime as needed for muscle spasms.     tretinoin (RETIN-A) 0.025 % gel Apply 1 Application topically at bedtime.     triamcinolone  (KENALOG ) 0.025 % ointment Apply 1 Application topically 2 (two) times daily.     valACYclovir  (VALTREX ) 1000 MG tablet Take 1,000 mg by mouth in the morning, at noon, and at bedtime.     No current facility-administered medications on file prior to visit.   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 07/25/2014    Followed in Pulmonary clinic/ [facility]/ [place]    Cardiac dysrhythmia    Chronic idiopathic constipation    Colitis, acute 02/08/2021   Coronary artery disease    Depression, major, recurrent, moderate (HCC)    Headache    Heart murmur    History of bladder infections    History of COVID-19    Hyperlipidemia    Hypertension    Hypothyroidism    Knee pain, bilateral    Melena 02/08/2021   Osteoarthritis    Pneumonia    Primary insomnia    Recovering alcoholic (HCC)    SVT (supraventricular tachycardia) (HCC)    UTI (urinary tract infection)    Varicose veins    Vitamin B12 deficiency    Past Surgical History:  Procedure Laterality Date   ABDOMINAL HYSTERECTOMY  1991   APPENDECTOMY     BACK SURGERY     had 2 surgeries. Have rods placed in back    BREAST BIOPSY Left    x2   BREAST ENHANCEMENT SURGERY  2006   CARDIAC CATHETERIZATION  12/22/2020   stents placed   CHOLECYSTECTOMY  12/2019   removed lap band.    COLONOSCOPY  12/01/2016   Moderate predominantly sigmod diverticulosis. Otherwise grossly normal colonoscopy   EYE SURGERY     IOL- bilateral - [place]   KNEE ARTHROSCOPY Left    lap band surgery  1981   LUMBAR FUSION  07/29/2015   posterior level one   removal of cervical disc fragments  10/2007   pt. denies    TUBAL LIGATION  1970    Family History  Problem Relation Age of Onset   Heart disease Mother    Heart disease Father    Diabetes Sister    Other Sister        BRAIN TUMOR   Heart disease Brother    Heart disease Brother    Heart disease Brother    Heart disease Brother    Heart disease Brother    Colon cancer Maternal Aunt    Colon cancer Cousin    Social History  Socioeconomic History   Marital status: Widowed    Spouse name: Not on file   Number of children: 2   Years of education: Not on file   Highest education level: Not on file  Occupational History   Occupation: retired    Comment: nurse  Tobacco Use   Smoking  status: Never   Smokeless tobacco: Never  Vaping Use   Vaping status: Never Used  Substance and Sexual Activity   Alcohol use: No    Alcohol/week: 0.0 standard drinks of alcohol    Comment: recovery x 20 yrs   Drug use: No   Sexual activity: Not Currently    Comment: MARRIED  Other Topics Concern   Not on file  Social History Narrative   Not on file   Social Drivers of Health   Financial Resource Strain: Low Risk  (12/12/2022)   Overall Financial Resource Strain (CARDIA)    Difficulty of Paying Living Expenses: Not hard at all  Food Insecurity: No Food Insecurity (10/18/2023)   Hunger Vital Sign    Worried About Running Out of Food in the Last Year: Never true    Ran Out of Food in the Last Year: Never true  Transportation Needs: No Transportation Needs (10/18/2023)   PRAPARE - Administrator, Civil Service (Medical): No    Lack of Transportation (Non-Medical): No  Physical Activity: Inactive (12/12/2022)   Exercise Vital Sign    Days of Exercise per Week: 0 days    Minutes of Exercise per Session: 0 min  Stress: No Stress Concern Present (12/12/2022)   [employer] of Occupational Health - Occupational Stress Questionnaire    Feeling of Stress : Not at all  Social Connections: Moderately Isolated (12/12/2022)   Social Connection and Isolation Panel    Frequency of Communication with Friends and Family: Three times a week    Frequency of Social Gatherings with Friends and Family: Three times a week    Attends Religious Services: More than 4 times per year    Active Member of Clubs or Organizations: No    Attends Banker Meetings: Never    Marital Status: Widowed    Objective:  BP 126/76   Pulse 74   Temp 97.9 F (36.6 C)   Ht 5' 5 (1.651 m)   Wt 157 lb (71.2 kg)   SpO2 97%   BMI 26.13 kg/m      01/12/2024    9:40 AM 01/01/2024   11:20 AM 12/12/2023    9:52 AM  BP/Weight  Systolic BP 126 120 136  Diastolic BP 76 74 72  Wt. (Lbs) 157  149.8 155  BMI 26.13 kg/m2 24.93 kg/m2 25.79 kg/m2    Physical Exam Vitals reviewed.  Constitutional:      Appearance: Normal appearance. She is normal weight.  Neck:     Vascular: No carotid bruit.  Cardiovascular:     Rate and Rhythm: Normal rate and regular rhythm.     Heart sounds: Normal heart sounds.  Pulmonary:     Effort: Pulmonary effort is normal. No respiratory distress.     Breath sounds: Normal breath sounds.  Abdominal:     General: Abdomen is flat. Bowel sounds are normal.     Palpations: Abdomen is soft.     Tenderness: There is no abdominal tenderness.  Skin:    Findings: Lesion (dry, scaling lesions on scalp and over left forehead.) present.  Neurological:     Mental Status: She is  alert and oriented to person, place, and time.  Psychiatric:        Mood and Affect: Mood normal.        Behavior: Behavior normal.         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  No changes to medicines. Metoprolol  tartrate 50 mg twice daily, on lisinopril  5 mg daily. Continue to work on eating a healthy diet and exercise.     Moderate recurrent major depression (HCC) Assessment & Plan: Continue Wellbutrin  xl 300 mg once daily in am.  Discontinue cymbalta .    Mixed hyperlipidemia Assessment & Plan: Well controlled Currently on  repatha Continue to work on eating a healthy diet and exercise.      GAD (generalized anxiety disorder) Assessment & Plan: - Continue Xanax  0.5 mg as needed - Continue Prozac  as prescribed   Bronchiectasis without complication (HCC) Assessment & Plan: Stable. Continue Trelegy 1 inhalation daily. Recommend more frequent use of AfloVest.   Prediabetes Assessment & Plan: Recommend continue to work on eating healthy diet and exercise. Check A1c.    Lumbar back pain Assessment & Plan: Unable to take nonsteroid antiinflammatories, such as naproxen (aleve) or ibuprofen (advil.) due to ckd. Unfortunately, mobic is the one thing that helped.  Start on flector  patches.  Failed on lidoderm  patches, tylenol , and even narcotics from the pain clinic is not helping.    Orders: -     Diclofenac  Epolamine; Place 1 patch onto the skin 2 (two) times daily.  Dispense: 60 patch; Refill: 5  Neuropathic postherpetic trigeminal neuralgia Assessment & Plan: Await biopsy.  Complete valtrex  .   CKD stage 3a, GFR 45-59 ml/min (HCC) Assessment & Plan: Stop meloxicam. No nonsteroid antiinflammatories, such as naproxen (aleve) or ibuprofen (advil.) recommended.       Meds ordered this encounter  Medications   diclofenac  (FLECTOR ) 1.3 % PTCH    Sig: Place 1 patch onto the skin 2 (two) times daily.    Dispense:  60 patch    Refill:  5    No orders of the defined types were placed in this encounter.    Follow-up: Return in about 3 months (around 04/13/2024) for chronic fasting.   I,Marla I Leal-Borjas,acting as a scribe for Abigail Free, MD.,have documented all relevant documentation on the behalf of Abigail Free, MD,as directed by  Abigail Free, MD while in the presence of Abigail Free, MD.    An After Visit Summary was printed and given to the patient.  I attest that I have reviewed this visit and agree with the plan scribed by my staff.  Abigail Free, MD [facility] [phone]

## 2024-01-12 ENCOUNTER — Encounter: Payer: Self-pay | Admitting: Family Medicine

## 2024-01-12 ENCOUNTER — Ambulatory Visit (INDEPENDENT_AMBULATORY_CARE_PROVIDER_SITE_OTHER): Admitting: Family Medicine

## 2024-01-12 VITALS — BP 126/76 | HR 74 | Temp 97.9°F | Ht 65.0 in | Wt 157.0 lb

## 2024-01-12 DIAGNOSIS — F411 Generalized anxiety disorder: Secondary | ICD-10-CM

## 2024-01-12 DIAGNOSIS — N1831 Chronic kidney disease, stage 3a: Secondary | ICD-10-CM | POA: Diagnosis not present

## 2024-01-12 DIAGNOSIS — I119 Hypertensive heart disease without heart failure: Secondary | ICD-10-CM

## 2024-01-12 DIAGNOSIS — B0222 Postherpetic trigeminal neuralgia: Secondary | ICD-10-CM

## 2024-01-12 DIAGNOSIS — E782 Mixed hyperlipidemia: Secondary | ICD-10-CM | POA: Diagnosis not present

## 2024-01-12 DIAGNOSIS — F331 Major depressive disorder, recurrent, moderate: Secondary | ICD-10-CM | POA: Diagnosis not present

## 2024-01-12 DIAGNOSIS — J479 Bronchiectasis, uncomplicated: Secondary | ICD-10-CM | POA: Diagnosis not present

## 2024-01-12 DIAGNOSIS — M545 Low back pain, unspecified: Secondary | ICD-10-CM | POA: Diagnosis not present

## 2024-01-12 DIAGNOSIS — R7303 Prediabetes: Secondary | ICD-10-CM | POA: Diagnosis not present

## 2024-01-12 MED ORDER — DICLOFENAC EPOLAMINE 1.3 % EX PTCH: 1.0000 | MEDICATED_PATCH | Freq: Two times a day (BID) | CUTANEOUS | 5 refills | Status: DC

## 2024-01-14 DIAGNOSIS — N1831 Chronic kidney disease, stage 3a: Secondary | ICD-10-CM | POA: Insufficient documentation

## 2024-01-14 NOTE — Assessment & Plan Note (Signed)
 Stable. Continue Trelegy 1 inhalation daily. Recommend more frequent use of AfloVest.

## 2024-01-14 NOTE — Assessment & Plan Note (Signed)
 Stop meloxicam. No nonsteroid antiinflammatories, such as naproxen (aleve) or ibuprofen (advil.) recommended.

## 2024-01-14 NOTE — Assessment & Plan Note (Signed)
 Well controlled.  No changes to medicines. Metoprolol  tartrate 50 mg twice daily, on lisinopril  5 mg daily. Continue to work on eating a healthy diet and exercise.

## 2024-01-14 NOTE — Assessment & Plan Note (Signed)
Recommend continue to work on eating healthy diet and exercise. Check A1c

## 2024-01-14 NOTE — Assessment & Plan Note (Signed)
 Unable to take nonsteroid antiinflammatories, such as naproxen (aleve) or ibuprofen (advil.) due to ckd. Unfortunately, mobic is the one thing that helped.  Start on flector  patches.  Failed on lidoderm  patches, tylenol , and even narcotics from the pain clinic is not helping.

## 2024-01-14 NOTE — Assessment & Plan Note (Addendum)
 Continue Wellbutrin  xl 300 mg once daily in am.  Discontinue cymbalta .

## 2024-01-14 NOTE — Assessment & Plan Note (Signed)
 Await biopsy.  Complete valtrex  .

## 2024-01-14 NOTE — Assessment & Plan Note (Signed)
 Well controlled Currently on  repatha Continue to work on eating a healthy diet and exercise.

## 2024-01-14 NOTE — Assessment & Plan Note (Signed)
-   Continue Xanax  0.5 mg as needed - Continue Prozac  as prescribed

## 2024-01-16 ENCOUNTER — Other Ambulatory Visit: Payer: Self-pay | Admitting: Family Medicine

## 2024-01-16 MED ORDER — DULOXETINE HCL 60 MG PO CPEP: 60.0000 mg | ORAL_CAPSULE | Freq: Two times a day (BID) | ORAL | 0 refills | Status: AC

## 2024-01-17 DIAGNOSIS — B029 Zoster without complications: Secondary | ICD-10-CM | POA: Diagnosis not present

## 2024-01-25 DIAGNOSIS — H43393 Other vitreous opacities, bilateral: Secondary | ICD-10-CM | POA: Diagnosis not present

## 2024-01-25 DIAGNOSIS — H532 Diplopia: Secondary | ICD-10-CM | POA: Diagnosis not present

## 2024-01-25 DIAGNOSIS — H18413 Arcus senilis, bilateral: Secondary | ICD-10-CM | POA: Diagnosis not present

## 2024-01-25 DIAGNOSIS — B0239 Other herpes zoster eye disease: Secondary | ICD-10-CM | POA: Diagnosis not present

## 2024-01-25 DIAGNOSIS — H04123 Dry eye syndrome of bilateral lacrimal glands: Secondary | ICD-10-CM | POA: Diagnosis not present

## 2024-01-25 DIAGNOSIS — D3131 Benign neoplasm of right choroid: Secondary | ICD-10-CM | POA: Diagnosis not present

## 2024-01-25 DIAGNOSIS — Z961 Presence of intraocular lens: Secondary | ICD-10-CM | POA: Diagnosis not present

## 2024-01-29 ENCOUNTER — Ambulatory Visit (INDEPENDENT_AMBULATORY_CARE_PROVIDER_SITE_OTHER): Admitting: Pulmonary Disease

## 2024-01-29 ENCOUNTER — Encounter: Payer: Self-pay | Admitting: Pulmonary Disease

## 2024-01-29 VITALS — BP 122/79 | HR 81 | Temp 97.8°F | Ht 65.0 in | Wt 156.6 lb

## 2024-01-29 DIAGNOSIS — J849 Interstitial pulmonary disease, unspecified: Secondary | ICD-10-CM

## 2024-01-29 DIAGNOSIS — J8489 Other specified interstitial pulmonary diseases: Secondary | ICD-10-CM | POA: Diagnosis not present

## 2024-01-29 DIAGNOSIS — J479 Bronchiectasis, uncomplicated: Secondary | ICD-10-CM

## 2024-01-29 LAB — SEDIMENTATION RATE: Sed Rate: 18 mm/h (ref 0–30)

## 2024-01-29 MED ORDER — TRELEGY ELLIPTA 100-62.5-25 MCG/ACT IN AEPB: 1.0000 | INHALATION_SPRAY | Freq: Every day | RESPIRATORY_TRACT | 11 refills | Status: AC

## 2024-01-29 NOTE — Patient Instructions (Signed)
  VISIT SUMMARY: Today, you came in for a follow-up appointment to discuss your pulmonary conditions and other health concerns. You were accompanied by your daughter, Courtney [name]. We reviewed your current symptoms and management strategies for your lung conditions, as well as your ongoing issues with shingles and back pain.  YOUR PLAN: -BRONCHIECTASIS AND INTERSTITIAL LUNG DISEASE: Bronchiectasis and interstitial lung disease are chronic lung conditions that cause breathing difficulties. Your condition is stable, but you should continue using your nebulizer for airway clearance and your Trelegy inhaler as prescribed. We will refill your Trelegy inhaler today. Additionally, we will order an alpha-1 antitrypsin level test and provide you with a flutter valve to help with airway clearance. A lung function test will be scheduled in six months.  INSTRUCTIONS: Please continue using your nebulizer and Trelegy inhaler as prescribed. We will refill your Trelegy inhaler today. Use the flutter valve for airway clearance as needed. We have ordered an alpha-1 antitrypsin level test for you. Additionally, a lung function test will be scheduled in six months. If your shingles symptoms worsen, please seek medical attention.                      Contains text generated by Abridge.                                 Contains text generated by Abridge.

## 2024-01-29 NOTE — Progress Notes (Signed)
 Courtney [name]    [id number]    [birth year]  Primary Care Physician:[name], Abigail, MD  Referring Physician: Sherre Abigail, MD [address] [place],  [place] [address]  Chief complaint: Follow-up for recurrent pneumonia, bronchiectasis  HPI: 78 year old with history of SVT, hyperlipidemia, allergies, GERD She has episodes of recurrent bronchiectasis, pneumonia.  Treated with antibiotics over the past few months.  Continues to have cough with congestion, wheezing, dyspnea.  Also has significant symptoms of postnasal drip, rhinitis. Sent to pulmonary clinic for evaluation of recurrent bronchitis, suspected bronchiectasis.  Over the past year in 2020  has had recurrent attacks of bronchitis/bronchiectasis exacerbation treated with antibiotics by primary care. Reports testing positive for Covid in September 2020 but did not need hospitaliation.  Also has recurrent UTIs for which she was given prolonged course of nitrofurantoin  in 2020. Given a trial of prednisone  in 2021  She did not tolerated it well at all with side effects of rash, mood changes, insomnia and stopped it after 9 days.  She did not tolerate Bactrim  for PCP prophylaxis as well.  She had a stent placed for coronary artery disease in July 2022.  Developed lower GI bleed due to Brilinta with admission at [facility].  Being followed by GI She was positive for COVID-19 in September 2022.  This was treated with prednisone .  She did not want antiviral treatment for COVID-19.     Interim history: Discussed the use of AI scribe software for clinical note transcription with the patient, who gave verbal consent to proceed.  History of Present Illness [name] is a 78 year old female with bronchiectasis and interstitial lung disease who presents for follow-up of her pulmonary conditions. She is accompanied by her daughter, Joen.  Pulmonary symptoms and management - Diagnosed with bronchiectasis and  interstitial lung disease - Breathing is stable without recent exacerbations - No hospitalizations for bronchiectasis exacerbation - Uses nebulizer for airway clearance - Uses percussion vest only when feeling congested due to difficulty from back pain related to prior surgeries - Requests refill for Trelegy inhaler - No tobacco use  Herpes zoster (shingles) - Shingles since September of the previous year - Persistent lesions on the left side and in the eye - Symptoms include severe pain and pruritus - Multiple emergency room visits for symptom relief - Gradual improvement in symptoms  Musculoskeletal limitations - History of four back surgeries - Chronic back pain limits ability to use percussion vest for airway clearance   Significant pulmonary history Pets: No pets Occupation: Retired Engineer, civil (consulting) Exposures: No known exposures, no mold, hot tub, Jacuzzi.  Has a down comforter which she got rid off on 09/13/2019 ILD exposure questionnaire 10/14/2019 significant for nitrofurantoin  in 2020 and down comforter.  Stopped exposure to down in 2021 Smoking history: Never smoker Travel history: Likes to travel around the country in an RV Relevant family history: Brother and sister have asthma.   Outpatient Encounter Medications as of 01/29/2024  Medication Sig   acyclovir  ointment (ZOVIRAX ) 5 % Apply 1 Application topically every 4 (four) hours as needed.   albuterol  (VENTOLIN  HFA) 108 (90 Base) MCG/ACT inhaler Inhale 2 puffs into the lungs every 6 (six) hours as needed for wheezing or shortness of breath.   ALPRAZolam  (XANAX ) 0.25 MG tablet Take 1 tablet (0.25 mg total) by mouth at bedtime as needed for anxiety.   amLODipine  (NORVASC ) 5 MG tablet Take 1 tablet (5 mg  total) by mouth daily.   Ascorbic Acid (VITAMIN C) 1000 MG tablet Take 1,000 mg by mouth daily.   aspirin EC 81 MG tablet Take 81 mg by mouth daily. Swallow whole.   buPROPion  (WELLBUTRIN  XL) 300 MG 24 hr tablet Take 1 tablet (300  mg total) by mouth daily.   Cholecalciferol (D3 PO) Take 2 tablets by mouth daily. chewable   diclofenac  (FLECTOR ) 1.3 % PTCH Place 1 patch onto the skin 2 (two) times daily.   DULoxetine  (CYMBALTA ) 60 MG capsule Take 1 capsule (60 mg total) by mouth 2 (two) times daily.   Fluticasone -Umeclidin-Vilant (TRELEGY ELLIPTA ) 100-62.5-25 MCG/ACT AEPB Inhale 1 puff into the lungs daily.   furosemide  (LASIX ) 20 MG tablet Take 20 mg by mouth daily.   levothyroxine  (SYNTHROID ) 75 MCG tablet TAKE 1 TABLET(75 MCG) BY MOUTH DAILY   lidocaine  (XYLOCAINE ) 5 % ointment Apply 1 Application topically as needed.   lidocaine -prilocaine  (EMLA ) cream Apply 1 Application topically 4 (four) times daily as needed (pain).   lisinopril  (ZESTRIL ) 2.5 MG tablet Take 2.5 mg by mouth daily.   metoprolol  tartrate (LOPRESSOR ) 50 MG tablet TAKE 1 TABLET(50 MG) BY MOUTH TWICE DAILY WITH MEALS   montelukast  (SINGULAIR ) 10 MG tablet Take 10 mg by mouth daily as needed (allergies).   MOUNJARO  12.5 MG/0.5ML Pen ADMINISTER 12.5 MG UNDER THE SKIN 1 TIME A WEEK FOR 16 DOSES   mupirocin ointment (BACTROBAN) 2 % Apply 1 Application topically 2 (two) times daily.   naloxone (NARCAN) nasal spray 4 mg/0.1 mL Place 1 spray into the nose once. In case of opioid overdose.   nitroGLYCERIN  (NITROSTAT ) 0.4 MG SL tablet DISSOLVE ONE TABLET UNDER TONGUE AS NEEDED FOR CHEST PAIN EVERY 5 MINUTES FOR UP TO 3 DOSES   omeprazole  (PRILOSEC) 40 MG capsule Take 40 mg by mouth daily as needed (reflux).   oxyCODONE -acetaminophen  (PERCOCET) 10-325 MG tablet Take 1 tablet by mouth See admin instructions. Take 1 tablet by mouth 5 times a day. May skip a dose if not needed.   REPATHA SURECLICK 140 MG/ML SOAJ Inject 140 mg into the skin every 14 (fourteen) days.   tiZANidine  (ZANAFLEX ) 4 MG tablet Take 4 mg by mouth at bedtime as needed for muscle spasms.   tretinoin (RETIN-A) 0.025 % gel Apply 1 Application topically at bedtime.   triamcinolone  (KENALOG ) 0.025 %  ointment Apply 1 Application topically 2 (two) times daily.   valACYclovir  (VALTREX ) 1000 MG tablet Take 1,000 mg by mouth in the morning, at noon, and at bedtime.   No facility-administered encounter medications on file as of 01/29/2024.   Vitals:   01/29/24 1329  BP: 122/79  Pulse: 81  Temp: 97.8 F (36.6 C)  Height: 5' 5 (1.651 m)  Weight: 156 lb 9.6 oz (71 kg)  SpO2: 97%  TempSrc: Temporal  BMI (Calculated): 26.06     Physical Exam GEN: No acute distress CV: Regular rate and rhythm no murmurs LUNGS: Clear to auscultation bilaterally normal respiratory effort SKIN JOINTS: Warm and dry no rash redness on the left side of the face    Data Reviewed: Imaging: CT angio 01/30/18- patchy multifocal groundglass opacities, nodular densities bilaterally left greater than right.  Patulous esophagus with mild thickening.  I have reviewed the images personally.  High-resolution CT 04/02/2018- mid and lower lung groundglass opacities, mild bronchiectasis and nodularity.  Aortic atherosclerosis, coronary artery calcification I reviewed the images personally.  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  CT 03/04/2021-stable ILD in alternate pattern  CTA 06/10/2023-no PE, chronic interstitial lung disease, progressive ground glass  CT high-resolution 12/15/2023-pulmonary pattern of interstitial lung disease in alternate pattern.  Stable compared to 2024, progressive from 2022 I have reviewed the images personally  PFTs: 03/21/2018 FVC 2.53 [90%), FEV1 2.29 [99%), F/F 90, TLC 85%, DLCO 87%  02/11/2021 FVC 2.46 [90%], FEV1 2.14 [104%], F/F 87, TLC 4.0 [1%], DLCO 14.87 [80%] Normal test  FENO 03/20/2018-24  Labs: CBC from primary care 03/05/2018-WBC 7, granulocytes 67%, lymphocytes 26%. CBC 03/20/1989-WBC 8, eos 4.7%, absolute eosinophil count 376  Bronchiectasis work up 04/10/2018 Sputum  culture -negative for mycobacteria, regular cultures, fungus cultures IgG, IgA, IgM-normal Alpha-1 antitrypsin-101, PIMZ IgE- 98 ANA, rheumatoid factor- negative  Repeat sputum culture 09/08/2019-Candida albicans, AFB negative  Assessment:  Assessment and Plan Assessment & Plan Bronchiectasis and interstitial lung disease CT scan reviewed interstitial opacities suggestive of hypersensitivity pneumonitis She does have down pillows which she got rid of in March 2021, hypersensitivity panel is negative Other possibilities include ILD from nitrofurantoin , post Covid ILD Sputum culture showing Candida albicans which is likely not significant.  I had recommended a bronchoscopy for further evaluation with BAL and transbronchial biopsy but patient and her daughter in clinic  were reluctant to undergo an invasive procedure.  Has not tolerated a trial of prednisone   We have done allergens avoidance getting rid of the down comforter and nitrofurantoin  and follow-up CT and PFTs earlier this month shows stability.  Lung function test is normal which is reassuring  Her latest CT is stable compared to last year, though slightly worse compared to 2022. The percussion vest prescribed for airway clearance is not being used frequently due to back pain from previous surgeries. - Refill Trelegy inhaler. - Order alpha-1 antitrypsin level. - Provide flutter valve for airway clearance. - Schedule lung function test in six months.  Herpes zoster with residual lesions involving left side and ocular region Herpes zoster with residual lesions persisting for nearly a year, involving the left side and ocular region. The condition is improving but has been associated with significant pain and itching, requiring multiple ER visits for management.   GERD, ? aspiration History noted for gastric lap band, GERD and esophagitis on recent CT scan.  She may have esophageal dysfunction and recurrent aspiration that is causing  the basal fibrosis. Gastric lap band removed in 2021 She is noncompliant with PPI and I have encouraged her to start taking it on a regular basis Consider barium swallow if she continues to have symptoms  Cough, postnasal drip Continue chlorphentermine, Flonase  nasal spray   Plan/Recommendations: Continue inhalers PFTs in 6 months  Lonna Coder MD [facility] 01/29/2024, 1:46 PM  CC: Sherre Clapper, MD

## 2024-01-31 ENCOUNTER — Telehealth: Payer: Self-pay

## 2024-01-31 DIAGNOSIS — B029 Zoster without complications: Secondary | ICD-10-CM | POA: Diagnosis not present

## 2024-01-31 LAB — SJOGRENS SYNDROME-B EXTRACTABLE NUCLEAR ANTIBODY: SSB (La) (ENA) Antibody, IgG: <1 AI

## 2024-01-31 LAB — SJOGRENS SYNDROME-A EXTRACTABLE NUCLEAR ANTIBODY: SSA (Ro) (ENA) Antibody, IgG: <1 AI

## 2024-01-31 LAB — ALPHA-1-ANTITRYPSIN: A-1 Antitrypsin, Ser: 99 mg/dL (ref 83–199)

## 2024-01-31 LAB — ANGIOTENSIN CONVERTING ENZYME: Angiotensin-Converting Enzyme: 8 U/L — ABNORMAL LOW (ref 9–67)

## 2024-01-31 NOTE — Telephone Encounter (Signed)
 Copied from CRM [id number]. Topic: Clinical - Medical Advice >> Jan 31, 2024 10:04 AM Jasmin G wrote: Reason for CRM: Pt has had shingles since last September, she states that she wants to meet her grand baby that is 96 months old and would like to see if it's safe to do so, pt is leaving to [place]  in the morning, please call her back ASAP at [phone] to discuss.

## 2024-01-31 NOTE — Telephone Encounter (Signed)
 Patient Made Aware, Verbalized Understanding.

## 2024-02-03 ENCOUNTER — Ambulatory Visit: Payer: Self-pay | Admitting: Pulmonary Disease

## 2024-02-06 ENCOUNTER — Ambulatory Visit: Payer: Self-pay

## 2024-02-06 MED ORDER — NYSTATIN 100000 UNIT/ML MT SUSP
5.0000 mL | Freq: Four times a day (QID) | OROMUCOSAL | 0 refills | Status: DC
Start: 2024-02-06 — End: 2024-02-28

## 2024-02-06 NOTE — Telephone Encounter (Signed)
 Sent nystatin . Dr. Sherre

## 2024-02-06 NOTE — Telephone Encounter (Signed)
 FYI Only or Action Required?: Action required by provider: medication refill request.  Patient was last seen in primary care on 01/12/2024 by Sherre Clapper, MD.  Called Nurse Triage reporting Thrush.  Symptoms began several days ago.  Interventions attempted: Nothing.  Symptoms are: gradually worsening.  Triage Disposition: Home Care  Patient/caregiver understands and will follow disposition?: Yes   Copied from CRM [id number]. Topic: Clinical - Prescription Issue >> Feb 06, 2024 11:42 AM Courtney [name] wrote: Reason for CRM: Pt wants a refill of medication for thrush in mouth.Pt does not recall name of medication.  Please call pt at [phone] Reason for Disposition  [1] Mouth pain AND [2] present < 3 days  Answer Assessment - Initial Assessment Questions 1. ONSET: When did your mouth start hurting? (e.g., hours or days ago)      Day before yesterday, states had thrush and has been on this medication 2. SEVERITY: How bad is the pain? (Scale 1-10; or mild, moderate or severe)     Denies pain 3. SORES: Are there any sores or ulcers in the mouth? If Yes, ask: What part of the mouth are the sores in?     denies 4. FEVER: Do you have a fever? If Yes, ask: What is your temperature, how was it measured, and when did it start?     no 5. CAUSE: What do you think is causing the mouth pain?     thrush 6. OTHER SYMPTOMS: Do you have any other symptoms? (e.g., difficulty breathing)     no  Protocols used: Mouth Pain-A-AH

## 2024-02-09 DIAGNOSIS — G8929 Other chronic pain: Secondary | ICD-10-CM | POA: Diagnosis not present

## 2024-02-09 DIAGNOSIS — M961 Postlaminectomy syndrome, not elsewhere classified: Secondary | ICD-10-CM | POA: Diagnosis not present

## 2024-02-09 DIAGNOSIS — M48062 Spinal stenosis, lumbar region with neurogenic claudication: Secondary | ICD-10-CM | POA: Diagnosis not present

## 2024-02-09 DIAGNOSIS — M533 Sacrococcygeal disorders, not elsewhere classified: Secondary | ICD-10-CM | POA: Diagnosis not present

## 2024-02-12 DIAGNOSIS — F4542 Pain disorder with related psychological factors: Secondary | ICD-10-CM | POA: Diagnosis not present

## 2024-02-13 ENCOUNTER — Other Ambulatory Visit: Payer: Self-pay | Admitting: Family Medicine

## 2024-02-13 ENCOUNTER — Encounter: Payer: Self-pay | Admitting: Family Medicine

## 2024-02-13 ENCOUNTER — Ambulatory Visit (INDEPENDENT_AMBULATORY_CARE_PROVIDER_SITE_OTHER): Admitting: Family Medicine

## 2024-02-13 VITALS — BP 114/62 | HR 78 | Temp 98.0°F | Resp 18 | Ht 65.0 in | Wt 158.0 lb

## 2024-02-13 DIAGNOSIS — N3000 Acute cystitis without hematuria: Secondary | ICD-10-CM

## 2024-02-13 DIAGNOSIS — R7303 Prediabetes: Secondary | ICD-10-CM

## 2024-02-13 DIAGNOSIS — R300 Dysuria: Secondary | ICD-10-CM | POA: Diagnosis not present

## 2024-02-13 DIAGNOSIS — B0229 Other postherpetic nervous system involvement: Secondary | ICD-10-CM | POA: Diagnosis not present

## 2024-02-13 DIAGNOSIS — F331 Major depressive disorder, recurrent, moderate: Secondary | ICD-10-CM | POA: Diagnosis not present

## 2024-02-13 DIAGNOSIS — B370 Candidal stomatitis: Secondary | ICD-10-CM | POA: Insufficient documentation

## 2024-02-13 LAB — POCT URINALYSIS DIP (CLINITEK)
Bilirubin, UA: NEGATIVE
Blood, UA: NEGATIVE
Glucose, UA: NEGATIVE mg/dL
Ketones, POC UA: NEGATIVE mg/dL
Nitrite, UA: NEGATIVE
POC PROTEIN,UA: NEGATIVE
Spec Grav, UA: 1.01 (ref 1.010–1.025)
Urobilinogen, UA: 0.2 U/dL
pH, UA: 6 (ref 5.0–8.0)

## 2024-02-13 MED ORDER — CIPROFLOXACIN HCL 500 MG PO TABS: 500.0000 mg | ORAL_TABLET | Freq: Two times a day (BID) | ORAL | 0 refills | Status: AC

## 2024-02-13 MED ORDER — BUPROPION HCL ER (XL) 300 MG PO TB24: 300.0000 mg | ORAL_TABLET | Freq: Every day | ORAL | 0 refills | Status: AC

## 2024-02-13 MED ORDER — NYSTATIN 100000 UNIT/ML MT SUSP: 5.0000 mL | Freq: Three times a day (TID) | OROMUCOSAL | 0 refills | Status: DC

## 2024-02-13 NOTE — Assessment & Plan Note (Addendum)
 Not well controlled    02/13/2024    1:47 PM 11/22/2023    4:16 PM 10/18/2023   11:41 AM  PHQ9 SCORE ONLY  PHQ-9 Total Score 20 21  13       Data saved with a previous flowsheet row definition   Major depressive disorder and generalized anxiety disorder Increased anxiety and depression possibly due to life stressors. Previously on Wellbutrin  150 mg, did not pick up 300 mg prescription. Desires to avoid weight gain from medications. - Not interested in counseling at this time - Prescribe Wellbutrin  300 mg. - Encourage continuation of Cymbalta  60 mg twice daily.

## 2024-02-13 NOTE — Assessment & Plan Note (Signed)
 UA - negative Lab Results  Component Value Date   COLORU yellow 02/13/2024   CLARITYU cloudy (A) 02/13/2024   GLUCOSEUR negative 02/13/2024   BILIRUBINUR negative 02/13/2024   KETONESU negative 12/15/2022   SPECGRAV 1.010 02/13/2024   RBCUR negative 02/13/2024   PHUR 6.0 02/13/2024   PROTEINUR =30 (A) 12/11/2023   UROBILINOGEN 0.2 02/13/2024   LEUKOCYTESUR Small (1+) (A) 02/13/2024   - Continue to drink plenty of fluids  - Culture ordered

## 2024-02-13 NOTE — Assessment & Plan Note (Addendum)
 Improved Shingles (herpes zoster) of scalp Persistent shingles lesions on the scalp with ongoing discomfort. Managed with topical treatments by a dermatologist.  - Consider alternative treatments like cryotherapy as discussed previously - Continue treatment per specialist - see photo

## 2024-02-13 NOTE — Progress Notes (Signed)
 Acute Office Visit  Subjective:    Patient ID: Courtney [name], female    DOB: 05-29-1946, 78 y.o.   MRN: [id number]  Chief Complaint  Patient presents with   Thrush   Urinary Tract Infection    HPI: Patient is in today for UTI symptoms and thrush.  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 07/25/2014   Followed in Pulmonary clinic/  Healthcare/ [facility]    Cardiac dysrhythmia    Chronic idiopathic constipation    Colitis, acute 02/08/2021   Coronary artery disease    Depression, major, recurrent, moderate (HCC)    Headache    Heart murmur    History of bladder infections    History of COVID-19    Hyperlipidemia    Hypertension    Hypothyroidism    Knee pain, bilateral    Melena 02/08/2021   Osteoarthritis    Pneumonia    Primary insomnia    Recovering alcoholic (HCC)    SVT (supraventricular tachycardia) (HCC)    UTI (urinary tract infection)    Varicose veins    Vitamin B12 deficiency     Past Surgical History:  Procedure Laterality Date   ABDOMINAL HYSTERECTOMY  1991   APPENDECTOMY     BACK SURGERY     had 2 surgeries. Have rods placed in back    BREAST BIOPSY Left    x2   BREAST ENHANCEMENT SURGERY  2006   CARDIAC CATHETERIZATION  12/22/2020   stents placed   CHOLECYSTECTOMY  12/2019   removed lap band.    COLONOSCOPY  12/01/2016   Moderate predominantly sigmod diverticulosis. Otherwise grossly normal colonoscopy   EYE SURGERY     IOL- bilateral - [place]   KNEE ARTHROSCOPY Left    lap band surgery  1981   LUMBAR FUSION  07/29/2015   posterior level one   removal of cervical disc fragments  10/2007   pt. denies    TUBAL LIGATION  1970    Family History  Problem Relation Age of Onset   Heart disease Mother    Heart disease Father    Diabetes Sister    Other Sister        BRAIN TUMOR   Heart disease Brother    Heart disease  Brother    Heart disease Brother    Heart disease Brother    Heart disease Brother    Colon cancer Maternal Aunt    Colon cancer Cousin     Social History   Socioeconomic History   Marital status: Widowed    Spouse name: Not on file   Number of children: 2   Years of education: Not on file   Highest education level: Not on file  Occupational History   Occupation: retired    Comment: nurse  Tobacco Use   Smoking status: Never   Smokeless tobacco: Never  Vaping Use   Vaping status: Never Used  Substance and Sexual Activity   Alcohol use: No    Alcohol/week: 0.0 standard drinks of alcohol    Comment: recovery x 20 yrs   Drug use: No   Sexual activity: Not Currently    Comment: MARRIED  Other Topics Concern   Not on file  Social History Narrative   Not on file   Social Drivers of Health   Financial Resource Strain: Low Risk  (12/12/2022)  Overall Financial Resource Strain (CARDIA)    Difficulty of Paying Living Expenses: Not hard at all  Food Insecurity: No Food Insecurity (10/18/2023)   Hunger Vital Sign    Worried About Running Out of Food in the Last Year: Never true    Ran Out of Food in the Last Year: Never true  Transportation Needs: No Transportation Needs (10/18/2023)   PRAPARE - Administrator, Civil Service (Medical): No    Lack of Transportation (Non-Medical): No  Physical Activity: Inactive (12/12/2022)   Exercise Vital Sign    Days of Exercise per Week: 0 days    Minutes of Exercise per Session: 0 min  Stress: No Stress Concern Present (12/12/2022)   [employer] of Occupational Health - Occupational Stress Questionnaire    Feeling of Stress : Not at all  Social Connections: Moderately Isolated (12/12/2022)   Social Connection and Isolation Panel    Frequency of Communication with Friends and Family: Three times a week    Frequency of Social Gatherings with Friends and Family: Three times a week    Attends Religious Services: More than 4  times per year    Active Member of Clubs or Organizations: No    Attends Banker Meetings: Never    Marital Status: Widowed  Intimate Partner Violence: Not At Risk (10/18/2023)   Humiliation, Afraid, Rape, and Kick questionnaire    Fear of Current or Ex-Partner: No    Emotionally Abused: No    Physically Abused: No    Sexually Abused: No    Outpatient Medications Prior to Visit  Medication Sig Dispense Refill   acyclovir  ointment (ZOVIRAX ) 5 % Apply 1 Application topically every 4 (four) hours as needed. 30 g 0   albuterol  (VENTOLIN  HFA) 108 (90 Base) MCG/ACT inhaler Inhale 2 puffs into the lungs every 6 (six) hours as needed for wheezing or shortness of breath. 8 g 2   ALPRAZolam  (XANAX ) 0.25 MG tablet Take 1 tablet (0.25 mg total) by mouth at bedtime as needed for anxiety. 10 tablet 0   amLODipine  (NORVASC ) 5 MG tablet Take 1 tablet (5 mg total) by mouth daily. 90 tablet 0   Ascorbic Acid (VITAMIN C) 1000 MG tablet Take 1,000 mg by mouth daily.     aspirin EC 81 MG tablet Take 81 mg by mouth daily. Swallow whole.     budesonide -formoterol  (SYMBICORT ) 160-4.5 MCG/ACT inhaler Inhale 2 puffs into the lungs daily as needed.     Cholecalciferol (D3 PO) Take 2 tablets by mouth daily. chewable     diclofenac  (FLECTOR ) 1.3 % PTCH Place 1 patch onto the skin 2 (two) times daily. 60 patch 5   DULoxetine  (CYMBALTA ) 60 MG capsule Take 1 capsule (60 mg total) by mouth 2 (two) times daily. 180 capsule 0   Fluticasone -Umeclidin-Vilant (TRELEGY ELLIPTA ) 100-62.5-25 MCG/ACT AEPB Inhale 1 puff into the lungs daily. 60 each 11   furosemide  (LASIX ) 20 MG tablet Take 20 mg by mouth daily.     levothyroxine  (SYNTHROID ) 75 MCG tablet TAKE 1 TABLET(75 MCG) BY MOUTH DAILY 90 tablet 1   lidocaine  (XYLOCAINE ) 5 % ointment Apply 1 Application topically as needed. 35.44 g 0   lidocaine -prilocaine  (EMLA ) cream Apply 1 Application topically 4 (four) times daily as needed (pain). 30 g 0   lisinopril   (ZESTRIL ) 2.5 MG tablet Take 2.5 mg by mouth daily.     metoprolol  tartrate (LOPRESSOR ) 50 MG tablet TAKE 1 TABLET(50 MG) BY MOUTH TWICE DAILY WITH MEALS  180 tablet 1   montelukast  (SINGULAIR ) 10 MG tablet Take 10 mg by mouth daily as needed (allergies).     MOUNJARO  12.5 MG/0.5ML Pen ADMINISTER 12.5 MG UNDER THE SKIN 1 TIME A WEEK FOR 16 DOSES 2 mL 3   mupirocin ointment (BACTROBAN) 2 % Apply 1 Application topically 2 (two) times daily.     naloxone (NARCAN) nasal spray 4 mg/0.1 mL Place 1 spray into the nose once. In case of opioid overdose.     nitroGLYCERIN  (NITROSTAT ) 0.4 MG SL tablet DISSOLVE ONE TABLET UNDER TONGUE AS NEEDED FOR CHEST PAIN EVERY 5 MINUTES FOR UP TO 3 DOSES 25 tablet 1   nystatin  (MYCOSTATIN ) 100000 UNIT/ML suspension Take 5 mLs (500,000 Units total) by mouth 4 (four) times daily. 120 mL 0   omeprazole  (PRILOSEC) 40 MG capsule Take 40 mg by mouth daily as needed (reflux).     oxyCODONE -acetaminophen  (PERCOCET) 10-325 MG tablet Take 1 tablet by mouth See admin instructions. Take 1 tablet by mouth 5 times a day. May skip a dose if not needed.     REPATHA SURECLICK 140 MG/ML SOAJ Inject 140 mg into the skin every 14 (fourteen) days.     tiZANidine  (ZANAFLEX ) 4 MG tablet Take 4 mg by mouth at bedtime as needed for muscle spasms.     tretinoin (RETIN-A) 0.025 % gel Apply 1 Application topically at bedtime.     triamcinolone  (KENALOG ) 0.025 % ointment Apply 1 Application topically 2 (two) times daily.     valACYclovir  (VALTREX ) 1000 MG tablet Take 1,000 mg by mouth in the morning, at noon, and at bedtime.     buPROPion  (WELLBUTRIN  XL) 300 MG 24 hr tablet Take 1 tablet (300 mg total) by mouth daily. (Patient not taking: Reported on 02/13/2024) 90 tablet 0   No facility-administered medications prior to visit.    Allergies  Allergen Reactions   Levofloxacin Hives, Shortness Of Breath, Swelling and Other (See Comments)    Tolerates Ciprofloxacin  RASH IN MOUTH   (can take IV  route)   Bactrim  [Sulfamethoxazole -Trimethoprim ] Hives and Itching   Clarithromycin      GI upset Other reaction(s): GI Upset (intolerance) GI upset -can take but does cause mild upset    Lyrica [Pregabalin]    Nexletol [Bempedoic Acid]     Muscle pain   Statins Other (See Comments)    Myalgias   Zetia  [Ezetimibe ]     Muscle pain    Review of Systems  Constitutional:  Negative for chills, diaphoresis, fatigue and fever.  HENT:  Negative for congestion, ear pain and sinus pain.   Respiratory:  Negative for cough and shortness of breath.   Cardiovascular:  Negative for chest pain.  Gastrointestinal:  Positive for nausea. Negative for abdominal pain, constipation, diarrhea and vomiting.  Genitourinary:  Positive for dysuria, flank pain (left), frequency and urgency.  Musculoskeletal:  Positive for back pain. Negative for arthralgias.  Skin:  Positive for rash (shingles - scalp).  Neurological:  Positive for headaches. Negative for dizziness, weakness and light-headedness.  Hematological: Negative.   Psychiatric/Behavioral:  Negative for dysphoric mood. The patient is not nervous/anxious.        Objective:        02/13/2024    2:09 PM 02/13/2024    1:14 PM 01/29/2024    1:29 PM  Vitals with BMI  Height  5' 5 5' 5  Weight  158 lbs 156 lbs 10 oz  BMI  26.29 26.06  Systolic 114 138 877  Diastolic  62 72 79  Pulse  78 81    Orthostatic VS for the past 72 hrs (Last 3 readings):  Patient Position BP Location  02/13/24 1409 Sitting Left Arm     Physical Exam Vitals reviewed.  Constitutional:      General: She is not in acute distress.    Appearance: Normal appearance. She is not ill-appearing.  Eyes:     Conjunctiva/sclera: Conjunctivae normal.  Cardiovascular:     Rate and Rhythm: Normal rate and regular rhythm.     Heart sounds: Normal heart sounds. No murmur heard. Pulmonary:     Effort: Pulmonary effort is normal.     Breath sounds: Normal breath sounds. No  wheezing.  Abdominal:     General: Bowel sounds are normal.     Palpations: Abdomen is soft.  Musculoskeletal:        General: Normal range of motion.     Cervical back: Normal range of motion.  Skin:    General: Skin is warm.     Comments: Thrush  Shingles - scalp  Neurological:     Mental Status: She is alert. Mental status is at baseline.  Psychiatric:        Mood and Affect: Mood normal.        Behavior: Behavior normal.        Thought Content: Thought content normal.        Health Maintenance Due  Topic Date Due   Colonoscopy  12/01/2021   Medicare Annual Wellness (AWV)  04/29/2023    There are no preventive care reminders to display for this patient.   Lab Results  Component Value Date   TSH 2.580 01/01/2024   Lab Results  Component Value Date   WBC 7.5 01/01/2024   HGB 12.5 01/01/2024   HCT 37.8 01/01/2024   MCV 96 01/01/2024   PLT 298 01/01/2024   Lab Results  Component Value Date   NA 131 (L) 01/01/2024   K 4.8 01/01/2024   CO2 23 01/01/2024   GLUCOSE 93 01/01/2024   BUN 17 01/01/2024   CREATININE 1.17 (H) 01/01/2024   BILITOT 0.3 01/01/2024   ALKPHOS 129 (H) 01/01/2024   AST 22 01/01/2024   ALT 16 01/01/2024   PROT 7.3 01/01/2024   ALBUMIN 4.4 01/01/2024   CALCIUM 10.2 01/01/2024   ANIONGAP 11 06/11/2023   EGFR 48 (L) 01/01/2024   Lab Results  Component Value Date   CHOL 215 (H) 01/01/2024   Lab Results  Component Value Date   HDL 55 01/01/2024   Lab Results  Component Value Date   LDLCALC 139 (H) 01/01/2024   Lab Results  Component Value Date   TRIG 119 01/01/2024   Lab Results  Component Value Date   CHOLHDL 3.9 01/01/2024   Lab Results  Component Value Date   HGBA1C 5.6 12/12/2022       Assessment & Plan:  Acute cystitis without hematuria Assessment & Plan: UTI Patient here with symptoms of dysuria started yesterday. UA in the office showed Lab Results  Component Value Date   COLORU yellow 02/13/2024    CLARITYU cloudy (A) 02/13/2024   GLUCOSEUR negative 02/13/2024   BILIRUBINUR negative 02/13/2024   KETONESU negative 12/15/2022   SPECGRAV 1.010 02/13/2024   RBCUR negative 02/13/2024   PHUR 6.0 02/13/2024   PROTEINUR =30 (A) 12/11/2023   UROBILINOGEN 0.2 02/13/2024   LEUKOCYTESUR Small (1+) (A) 02/13/2024    Suprapubic tenderness noted on exam.  She does not appear septic,  has left flank tenderness.  Vitals reassuring.   Plan: Symptoms suggestive of urinary tract infection. I have prescribed Cipro  500 mg by mouth TWICE A DAY for 10 days. - Urine culture sent Your symptoms should gradually improve. Call if the burning worsens, you develop a fever, back pain or pelvic pain or if your symptom do not resolve after completing the antibiotic. Drink plenty of fluids Complete the full course of antibiotics even if the symptoms resolve Remember to wipe from front to back and don't hold it in! If possible, empty your bladder every 4 hours. Advised to watch out for any fever/chills/back pain and to go to the emergency room if she does for evaluation for pyelonephritis and IV fluids and IV antibiotics.   Orders: -     Ciprofloxacin  HCl; Take 1 tablet (500 mg total) by mouth 2 (two) times daily for 10 days.  Dispense: 20 tablet; Refill: 0  Dysuria Assessment & Plan: UA - negative Lab Results  Component Value Date   COLORU yellow 02/13/2024   CLARITYU cloudy (A) 02/13/2024   GLUCOSEUR negative 02/13/2024   BILIRUBINUR negative 02/13/2024   KETONESU negative 12/15/2022   SPECGRAV 1.010 02/13/2024   RBCUR negative 02/13/2024   PHUR 6.0 02/13/2024   PROTEINUR =30 (A) 12/11/2023   UROBILINOGEN 0.2 02/13/2024   LEUKOCYTESUR Small (1+) (A) 02/13/2024   - Continue to drink plenty of fluids  - Culture ordered  Orders: -     POCT URINALYSIS DIP (CLINITEK) -     Urine Culture -     Ciprofloxacin  HCl; Take 1 tablet (500 mg total) by mouth 2 (two) times daily for 10 days.  Dispense: 20 tablet;  Refill: 0  Moderate recurrent major depression (HCC) Assessment & Plan: Not well controlled    02/13/2024    1:47 PM 11/22/2023    4:16 PM 10/18/2023   11:41 AM  PHQ9 SCORE ONLY  PHQ-9 Total Score 20 21  13       Data saved with a previous flowsheet row definition   Major depressive disorder and generalized anxiety disorder Increased anxiety and depression possibly due to life stressors. Previously on Wellbutrin  150 mg, did not pick up 300 mg prescription. Desires to avoid weight gain from medications. - Not interested in counseling at this time - Prescribe Wellbutrin  300 mg. - Encourage continuation of Cymbalta  60 mg twice daily.  Orders: -     buPROPion  HCl ER (XL); Take 1 tablet (300 mg total) by mouth daily.  Dispense: 90 tablet; Refill: 0  Thrush Assessment & Plan: Acute Symptomatic with oral lesions on the roof of her mouth. I was not able to visualize anything due to lighting.  - has recently tried diflucan  tablets x 2 days with temporary improvement - magic mouthwash as needed for symptoms  Orders: -     magic mouthwash (nystatin , diphenhydrAMINE , alum & mag hydroxide) suspension mixture; Swish and spit 5 mLs 3 (three) times daily.  Dispense: 240 mL; Refill: 0  Post herpetic neuralgia Assessment & Plan: Improved Shingles (herpes zoster) of scalp Persistent shingles lesions on the scalp with ongoing discomfort. Managed with topical treatments by a dermatologist.  - Consider alternative treatments like cryotherapy as discussed previously - Continue treatment per specialist - see photo           Meds ordered this encounter  Medications   ciprofloxacin  (CIPRO ) 500 MG tablet    Sig: Take 1 tablet (500 mg total) by mouth 2 (two) times daily for 10 days.  Dispense:  20 tablet    Refill:  0   buPROPion  (WELLBUTRIN  XL) 300 MG 24 hr tablet    Sig: Take 1 tablet (300 mg total) by mouth daily.    Dispense:  90 tablet    Refill:  0   magic mouthwash (nystatin ,  diphenhydrAMINE , alum & mag hydroxide) suspension mixture    Sig: Swish and spit 5 mLs 3 (three) times daily.    Dispense:  240 mL    Refill:  0    Orders Placed This Encounter  Procedures   Urine Culture   POCT URINALYSIS DIP (CLINITEK)     Follow-up: Return if symptoms worsen or fail to improve.  An After Visit Summary was printed and given to the patient.  Harrie Cedar, FNP [name] [phone]

## 2024-02-13 NOTE — Patient Instructions (Signed)
  VISIT SUMMARY: During your visit, we discussed your recurrent urinary tract infections, persistent oral thrush, chronic back pain, depression, anxiety, and shingles. We reviewed your symptoms and adjusted your treatment plan to help manage these conditions more effectively.  YOUR PLAN: -URINARY TRACT INFECTION: A urinary tract infection (UTI) is an infection in any part of your urinary system. You will take Cipro  500 mg twice daily for 10 days, and we will send your urine for a culture test. Please increase your water intake to help prevent future infections.  -CHRONIC BACK PAIN: Chronic back pain is long-lasting pain in your back. Your back pain is currently worse due to your UTI. Continue taking your pain medication as needed.  -ORAL CANDIDIASIS (THRUSH): Oral thrush is a fungal infection in your mouth. We will prescribe a different oral rinse to help manage this condition. You will also consult with Doctor [name] for further management.  -MAJOR DEPRESSIVE DISORDER AND GENERALIZED ANXIETY DISORDER: Major depressive disorder and generalized anxiety disorder are mental health conditions that affect your mood and anxiety levels. We will prescribe Wellbutrin  300 mg and encourage you to continue taking Cymbalta  60 mg twice daily.  -SHINGLES (HERPES ZOSTER) OF SCALP: Shingles is a viral infection that causes a painful rash. You have persistent lesions on your scalp, and you should continue using the topical treatments prescribed by your dermatologist.  INSTRUCTIONS: Please follow up with Doctor [name] for further management of your oral thrush. Continue taking your medications as prescribed and increase your water intake. If your symptoms worsen or do not improve, contact our office.                                 Contains text generated by [employer].

## 2024-02-13 NOTE — Assessment & Plan Note (Signed)
 UTI Patient here with symptoms of dysuria started yesterday. UA in the office showed Lab Results  Component Value Date   COLORU yellow 02/13/2024   CLARITYU cloudy (A) 02/13/2024   GLUCOSEUR negative 02/13/2024   BILIRUBINUR negative 02/13/2024   KETONESU negative 12/15/2022   SPECGRAV 1.010 02/13/2024   RBCUR negative 02/13/2024   PHUR 6.0 02/13/2024   PROTEINUR =30 (A) 12/11/2023   UROBILINOGEN 0.2 02/13/2024   LEUKOCYTESUR Small (1+) (A) 02/13/2024    Suprapubic tenderness noted on exam.  She does not appear septic, has left flank tenderness.  Vitals reassuring.   Plan: Symptoms suggestive of urinary tract infection. I have prescribed Cipro  500 mg by mouth TWICE A DAY for 10 days. - Urine culture sent Your symptoms should gradually improve. Call if the burning worsens, you develop a fever, back pain or pelvic pain or if your symptom do not resolve after completing the antibiotic. Drink plenty of fluids Complete the full course of antibiotics even if the symptoms resolve Remember to wipe from front to back and don't hold it in! If possible, empty your bladder every 4 hours. Advised to watch out for any fever/chills/back pain and to go to the emergency room if she does for evaluation for pyelonephritis and IV fluids and IV antibiotics.

## 2024-02-13 NOTE — Assessment & Plan Note (Signed)
 Acute Symptomatic with oral lesions on the roof of her mouth. I was not able to visualize anything due to lighting.  - has recently tried diflucan  tablets x 2 days with temporary improvement - magic mouthwash as needed for symptoms

## 2024-02-16 LAB — URINE CULTURE

## 2024-02-19 ENCOUNTER — Ambulatory Visit: Payer: Self-pay | Admitting: Family Medicine

## 2024-02-26 DIAGNOSIS — S335XXA Sprain of ligaments of lumbar spine, initial encounter: Secondary | ICD-10-CM | POA: Diagnosis not present

## 2024-02-26 DIAGNOSIS — M25552 Pain in left hip: Secondary | ICD-10-CM | POA: Diagnosis not present

## 2024-02-28 ENCOUNTER — Other Ambulatory Visit: Payer: Self-pay | Admitting: Family Medicine

## 2024-02-28 MED ORDER — CLOTRIMAZOLE 10 MG MT TROC: 10.0000 mg | Freq: Every day | OROMUCOSAL | 5 refills | Status: DC

## 2024-03-02 DIAGNOSIS — Z79899 Other long term (current) drug therapy: Secondary | ICD-10-CM | POA: Diagnosis not present

## 2024-03-02 DIAGNOSIS — J189 Pneumonia, unspecified organism: Secondary | ICD-10-CM | POA: Diagnosis not present

## 2024-03-02 DIAGNOSIS — R112 Nausea with vomiting, unspecified: Secondary | ICD-10-CM | POA: Diagnosis not present

## 2024-03-02 DIAGNOSIS — D751 Secondary polycythemia: Secondary | ICD-10-CM | POA: Diagnosis not present

## 2024-03-02 DIAGNOSIS — R4182 Altered mental status, unspecified: Secondary | ICD-10-CM | POA: Diagnosis not present

## 2024-03-02 DIAGNOSIS — R935 Abnormal findings on diagnostic imaging of other abdominal regions, including retroperitoneum: Secondary | ICD-10-CM | POA: Diagnosis not present

## 2024-03-02 DIAGNOSIS — R6520 Severe sepsis without septic shock: Secondary | ICD-10-CM | POA: Diagnosis not present

## 2024-03-02 DIAGNOSIS — K838 Other specified diseases of biliary tract: Secondary | ICD-10-CM | POA: Diagnosis not present

## 2024-03-02 DIAGNOSIS — G9341 Metabolic encephalopathy: Secondary | ICD-10-CM | POA: Diagnosis not present

## 2024-03-02 DIAGNOSIS — Z743 Need for continuous supervision: Secondary | ICD-10-CM | POA: Diagnosis not present

## 2024-03-02 DIAGNOSIS — K529 Noninfective gastroenteritis and colitis, unspecified: Secondary | ICD-10-CM | POA: Diagnosis not present

## 2024-03-02 DIAGNOSIS — J8410 Pulmonary fibrosis, unspecified: Secondary | ICD-10-CM | POA: Diagnosis not present

## 2024-03-02 DIAGNOSIS — R5381 Other malaise: Secondary | ICD-10-CM | POA: Diagnosis not present

## 2024-03-02 DIAGNOSIS — I959 Hypotension, unspecified: Secondary | ICD-10-CM | POA: Diagnosis not present

## 2024-03-02 DIAGNOSIS — J440 Chronic obstructive pulmonary disease with acute lower respiratory infection: Secondary | ICD-10-CM | POA: Diagnosis not present

## 2024-03-02 DIAGNOSIS — Z8619 Personal history of other infectious and parasitic diseases: Secondary | ICD-10-CM | POA: Diagnosis not present

## 2024-03-02 DIAGNOSIS — K2101 Gastro-esophageal reflux disease with esophagitis, with bleeding: Secondary | ICD-10-CM | POA: Diagnosis not present

## 2024-03-02 DIAGNOSIS — R1032 Left lower quadrant pain: Secondary | ICD-10-CM | POA: Diagnosis not present

## 2024-03-02 DIAGNOSIS — I129 Hypertensive chronic kidney disease with stage 1 through stage 4 chronic kidney disease, or unspecified chronic kidney disease: Secondary | ICD-10-CM | POA: Diagnosis not present

## 2024-03-02 DIAGNOSIS — N179 Acute kidney failure, unspecified: Secondary | ICD-10-CM | POA: Diagnosis not present

## 2024-03-02 DIAGNOSIS — E039 Hypothyroidism, unspecified: Secondary | ICD-10-CM | POA: Diagnosis not present

## 2024-03-02 DIAGNOSIS — E860 Dehydration: Secondary | ICD-10-CM | POA: Diagnosis not present

## 2024-03-02 DIAGNOSIS — A419 Sepsis, unspecified organism: Secondary | ICD-10-CM | POA: Diagnosis not present

## 2024-03-02 DIAGNOSIS — K6389 Other specified diseases of intestine: Secondary | ICD-10-CM | POA: Diagnosis not present

## 2024-03-02 DIAGNOSIS — R110 Nausea: Secondary | ICD-10-CM | POA: Diagnosis not present

## 2024-03-02 DIAGNOSIS — E872 Acidosis, unspecified: Secondary | ICD-10-CM | POA: Diagnosis not present

## 2024-03-02 DIAGNOSIS — E871 Hypo-osmolality and hyponatremia: Secondary | ICD-10-CM | POA: Diagnosis not present

## 2024-03-02 DIAGNOSIS — R197 Diarrhea, unspecified: Secondary | ICD-10-CM | POA: Diagnosis not present

## 2024-03-02 DIAGNOSIS — G9340 Encephalopathy, unspecified: Secondary | ICD-10-CM | POA: Diagnosis not present

## 2024-03-02 DIAGNOSIS — R55 Syncope and collapse: Secondary | ICD-10-CM | POA: Diagnosis not present

## 2024-03-02 DIAGNOSIS — N390 Urinary tract infection, site not specified: Secondary | ICD-10-CM | POA: Diagnosis not present

## 2024-03-02 DIAGNOSIS — R109 Unspecified abdominal pain: Secondary | ICD-10-CM | POA: Diagnosis not present

## 2024-03-02 DIAGNOSIS — R140 Abdominal distension (gaseous): Secondary | ICD-10-CM | POA: Diagnosis not present

## 2024-03-02 DIAGNOSIS — I2510 Atherosclerotic heart disease of native coronary artery without angina pectoris: Secondary | ICD-10-CM | POA: Diagnosis not present

## 2024-03-02 DIAGNOSIS — G894 Chronic pain syndrome: Secondary | ICD-10-CM | POA: Diagnosis not present

## 2024-03-02 DIAGNOSIS — K5733 Diverticulitis of large intestine without perforation or abscess with bleeding: Secondary | ICD-10-CM | POA: Diagnosis not present

## 2024-03-02 DIAGNOSIS — N182 Chronic kidney disease, stage 2 (mild): Secondary | ICD-10-CM | POA: Diagnosis not present

## 2024-03-03 DIAGNOSIS — R4182 Altered mental status, unspecified: Secondary | ICD-10-CM | POA: Insufficient documentation

## 2024-03-06 ENCOUNTER — Telehealth: Payer: Self-pay

## 2024-03-06 ENCOUNTER — Telehealth: Payer: Self-pay | Admitting: Gastroenterology

## 2024-03-06 ENCOUNTER — Encounter: Payer: Self-pay | Admitting: Gastroenterology

## 2024-03-06 NOTE — Telephone Encounter (Signed)
 Patient recently discharged from the hospital on antibiotics. She is experiencing diarrhea. Discussed avoiding daily, high fructose corn syrup and artificial sweeteners. Encourage re-hydration with electrolyte solution. Keep the appointment with her PCP tomorrow. To ER if she is feeling worse tonight, develops bloody diarrhea or other concerns.

## 2024-03-06 NOTE — Telephone Encounter (Signed)
 Courtney [name] that Dr. Sherre will follow patient. Patient has appointment tomorrow.  Copied from CRM [id number]. Topic: General - Other >> Mar 06, 2024  2:42 PM Tobias L wrote: Reason for CRM: Patient was admitted to [facility] hospital on vacation. The hospital sent over home health orders to [facility]. Tanya with [facility] inquiring if Harrie or Dr. Sherre will follow patient for physical therapy & nursing. [name] states she has records from the hospital and can send them over to office.   Requesting callback from clinical: [phone]

## 2024-03-06 NOTE — Telephone Encounter (Signed)
 Receiving call from patient caregiver stating she was hospitalized, has extreme diarrhea and also can't make it to the bathroom. Patient is requesting nurse fu call to discuss solutions or sooner scheduling. Please review and advise   Thank you

## 2024-03-07 ENCOUNTER — Ambulatory Visit (INDEPENDENT_AMBULATORY_CARE_PROVIDER_SITE_OTHER)

## 2024-03-07 ENCOUNTER — Telehealth: Payer: Self-pay | Admitting: Family Medicine

## 2024-03-07 ENCOUNTER — Telehealth: Payer: Self-pay

## 2024-03-07 VITALS — BP 124/70 | HR 92 | Temp 97.4°F | Ht 65.0 in | Wt 163.0 lb

## 2024-03-07 DIAGNOSIS — R197 Diarrhea, unspecified: Secondary | ICD-10-CM

## 2024-03-07 DIAGNOSIS — K529 Noninfective gastroenteritis and colitis, unspecified: Secondary | ICD-10-CM | POA: Diagnosis not present

## 2024-03-07 MED ORDER — MONTELUKAST SODIUM 10 MG PO TABS: 10.0000 mg | ORAL_TABLET | Freq: Every day | ORAL | 2 refills | Status: AC | PRN

## 2024-03-07 MED ORDER — TRETINOIN 0.025 % EX GEL: 1.0000 | Freq: Every day | CUTANEOUS | 0 refills | Status: AC

## 2024-03-07 MED ORDER — DICYCLOMINE HCL 20 MG PO TABS: 20.0000 mg | ORAL_TABLET | Freq: Two times a day (BID) | ORAL | 0 refills | Status: DC | PRN

## 2024-03-07 MED ORDER — PANTOPRAZOLE SODIUM 40 MG PO TBEC: 40.0000 mg | DELAYED_RELEASE_TABLET | Freq: Every day | ORAL | 3 refills | Status: AC

## 2024-03-07 MED ORDER — FUROSEMIDE 20 MG PO TABS: 20.0000 mg | ORAL_TABLET | Freq: Every day | ORAL | 2 refills | Status: AC | PRN

## 2024-03-07 NOTE — Telephone Encounter (Signed)
 [facility] [phone]

## 2024-03-07 NOTE — Progress Notes (Signed)
 Subjective:  Patient ID: Courtney [name], female    DOB: 1945/10/15  Age: 78 y.o. MRN: [id number]  Chief Complaint  Patient presents with   Hospitalization Follow-up    HPI: Discussed the use of AI scribe software for clinical note transcription with the patient, who gave verbal consent to proceed.   Courtney [name] is here with her daughter for a hospital discharge follow-up. She was reportedly on a vacation at [facility] last week and ate seafood at [employer]. Right after, she started to have diarrhea. After an episode of explosive diarrhea, she had profuse sweating, felt lightheaded and had some chest discomfort. She asked her daughter for a nitroglycerin  sublingual which further lowered her blood pressure and caused syncope for few seconds.  This was witnessed by her daughter. She immediately called 911. Patient did not reportedly lose her pulse. She was immediately taken to the nearest hospital where she was given IV fluids. She had extensive imaging to rule out stroke, heart attack which were negative. She had a CAT scan of her abdomen which showed diffuse colitis in the sigmoid colon. She was just given IV antibiotics. After she was stabilized, she was discharged 2 days later on 5 more days of oral antibiotics ciprofloxacin  and metronidazole .  Today, she states that she does not still feel her normal self.  Still feels weak. Has poor appetite. Denies any significant pain at this time. Her diarrhea continues to bother her but not as profuse as before.         02/13/2024    1:47 PM 11/22/2023    4:16 PM 10/18/2023   11:41 AM 09/28/2023   11:01 AM 07/14/2023   10:11 AM  Depression screen PHQ 2/9  Decreased Interest 3 3 3  0 0  Down, Depressed, Hopeless 2 3 2  0 0  PHQ - 2 Score 5 6 5  0 0  Altered sleeping 3 2 3     Tired, decreased energy 3 3 3     Change in appetite 3 3 0    Feeling bad or failure about yourself  1 3 1     Trouble concentrating 3 2 1     Moving slowly or  fidgety/restless 2 2 0    Suicidal thoughts 0 0 0    PHQ-9 Score 20 21 13     Difficult doing work/chores Somewhat difficult Very difficult Somewhat difficult          03/07/2024   11:27 AM  Fall Risk   Falls in the past year? 1  Number falls in past yr: 1  Injury with Fall? 1  Risk for fall due to : History of fall(s)  Follow up Falls evaluation completed    Patient Care Team: Sherre Clapper, MD as PCP - General (Internal Medicine) Dann Candyce RAMAN, MD as PCP - Cardiology (Cardiology) McGukin, Lynwood FABIENE Raddle., MD (Cardiology) [name], [name], MD as Consulting Physician (Pulmonary Disease) Nyle Rankin POUR, [facility] (Inactive) (Pharmacist)   Review of Systems  Constitutional:  Negative for chills, fatigue and fever.  HENT:  Negative for congestion, ear pain, sinus pressure and sore throat.   Respiratory:  Negative for cough and shortness of breath.   Cardiovascular:  Positive for leg swelling. Negative for chest pain.  Gastrointestinal:  Positive for abdominal pain. Negative for constipation, diarrhea, nausea and vomiting.  Genitourinary:  Negative for dysuria and frequency.  Musculoskeletal:  Positive for back pain. Negative for arthralgias and myalgias.  Neurological:  Negative for dizziness and headaches.  Psychiatric/Behavioral:  Negative for  dysphoric mood. The patient is not nervous/anxious.     Current Outpatient Medications on File Prior to Visit  Medication Sig Dispense Refill   acyclovir  ointment (ZOVIRAX ) 5 % Apply 1 Application topically every 4 (four) hours as needed. 30 g 0   albuterol  (VENTOLIN  HFA) 108 (90 Base) MCG/ACT inhaler Inhale 2 puffs into the lungs every 6 (six) hours as needed for wheezing or shortness of breath. 8 g 2   ALPRAZolam  (XANAX ) 0.25 MG tablet Take 1 tablet (0.25 mg total) by mouth at bedtime as needed for anxiety. 10 tablet 0   amLODipine  (NORVASC ) 5 MG tablet Take 1 tablet (5 mg total) by mouth daily. 90 tablet 0   Ascorbic Acid (VITAMIN C)  1000 MG tablet Take 1,000 mg by mouth daily.     aspirin EC 81 MG tablet Take 81 mg by mouth daily. Swallow whole.     budesonide -formoterol  (SYMBICORT ) 160-4.5 MCG/ACT inhaler Inhale 2 puffs into the lungs daily as needed.     buPROPion  (WELLBUTRIN  XL) 300 MG 24 hr tablet Take 1 tablet (300 mg total) by mouth daily. 90 tablet 0   cefpodoxime (VANTIN) 200 MG tablet Take 200 mg by mouth.     Cholecalciferol (D3 PO) Take 2 tablets by mouth daily. chewable     diclofenac  (FLECTOR ) 1.3 % PTCH Place 1 patch onto the skin 2 (two) times daily. 60 patch 5   DULoxetine  (CYMBALTA ) 60 MG capsule Take 1 capsule (60 mg total) by mouth 2 (two) times daily. 180 capsule 0   Fluticasone -Umeclidin-Vilant (TRELEGY ELLIPTA ) 100-62.5-25 MCG/ACT AEPB Inhale 1 puff into the lungs daily. 60 each 11   levothyroxine  (SYNTHROID ) 75 MCG tablet TAKE 1 TABLET(75 MCG) BY MOUTH DAILY 90 tablet 1   lidocaine -prilocaine  (EMLA ) cream Apply 1 Application topically 4 (four) times daily as needed (pain). 30 g 0   lisinopril  (ZESTRIL ) 2.5 MG tablet Take 2.5 mg by mouth daily.     metroNIDAZOLE  (FLAGYL ) 500 MG tablet Take 500 mg by mouth 2 (two) times daily.     MOUNJARO  12.5 MG/0.5ML Pen ADMINISTER 12.5 MG UNDER THE SKIN 1 TIME A WEEK FOR 16 DOSES 2 mL 3   mupirocin ointment (BACTROBAN) 2 % Apply 1 Application topically 2 (two) times daily.     naloxone (NARCAN) nasal spray 4 mg/0.1 mL Place 1 spray into the nose once. In case of opioid overdose.     nitroGLYCERIN  (NITROSTAT ) 0.4 MG SL tablet DISSOLVE ONE TABLET UNDER TONGUE AS NEEDED FOR CHEST PAIN EVERY 5 MINUTES FOR UP TO 3 DOSES 25 tablet 1   oxyCODONE -acetaminophen  (PERCOCET) 10-325 MG tablet Take 1 tablet by mouth See admin instructions. Take 1 tablet by mouth 5 times a day. May skip a dose if not needed.     REPATHA SURECLICK 140 MG/ML SOAJ Inject 140 mg into the skin every 14 (fourteen) days.     tiZANidine  (ZANAFLEX ) 4 MG tablet Take 4 mg by mouth at bedtime as needed for  muscle spasms.     clotrimazole  (MYCELEX ) 10 MG troche Take 1 tablet (10 mg total) by mouth 5 (five) times daily. (Patient not taking: Reported on 03/07/2024) 35 Troche 5   metoprolol  tartrate (LOPRESSOR ) 50 MG tablet TAKE 1 TABLET(50 MG) BY MOUTH TWICE DAILY WITH MEALS (Patient not taking: Reported on 03/07/2024) 180 tablet 1   triamcinolone  (KENALOG ) 0.025 % ointment Apply 1 Application topically 2 (two) times daily. (Patient not taking: Reported on 03/07/2024)     No current facility-administered medications on  file prior to visit.   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 07/25/2014   Followed in Pulmonary clinic/ [facility]/ [place]    Cardiac dysrhythmia    Chronic idiopathic constipation    Colitis, acute 02/08/2021   Coronary artery disease    Depression, major, recurrent, moderate (HCC)    Headache    Heart murmur    History of bladder infections    History of COVID-19    Hyperlipidemia    Hypertension    Hypothyroidism    Knee pain, bilateral    Melena 02/08/2021   Osteoarthritis    Pneumonia    Primary insomnia    Recovering alcoholic (HCC)    SVT (supraventricular tachycardia) (HCC)    UTI (urinary tract infection)    Varicose veins    Vitamin B12 deficiency    Past Surgical History:  Procedure Laterality Date   ABDOMINAL HYSTERECTOMY  1991   APPENDECTOMY     BACK SURGERY     had 2 surgeries. Have rods placed in back    BREAST BIOPSY Left    x2   BREAST ENHANCEMENT SURGERY  2006   CARDIAC CATHETERIZATION  12/22/2020   stents placed   CHOLECYSTECTOMY  12/2019   removed lap band.    COLONOSCOPY  12/01/2016   Moderate predominantly sigmod diverticulosis. Otherwise grossly normal colonoscopy   EYE SURGERY     IOL- bilateral - [place]   KNEE ARTHROSCOPY Left    lap band surgery  1981   LUMBAR FUSION  07/29/2015   posterior level one   removal of  cervical disc fragments  10/2007   pt. denies    TUBAL LIGATION  1970    Family History  Problem Relation Age of Onset   Heart disease Mother    Heart disease Father    Diabetes Sister    Other Sister        BRAIN TUMOR   Heart disease Brother    Heart disease Brother    Heart disease Brother    Heart disease Brother    Heart disease Brother    Colon cancer Maternal Aunt    Colon cancer Cousin    Social History   Socioeconomic History   Marital status: Widowed    Spouse name: Not on file   Number of children: 2   Years of education: Not on file   Highest education level: Not on file  Occupational History   Occupation: retired    Comment: nurse  Tobacco Use   Smoking status: Never   Smokeless tobacco: Never  Vaping Use   Vaping status: Never Used  Substance and Sexual Activity   Alcohol use: No    Alcohol/week: 0.0 standard drinks of alcohol    Comment: recovery x 20 yrs   Drug use: No   Sexual activity: Not Currently    Comment: MARRIED  Other Topics Concern   Not on file  Social History Narrative   Not on file   Social Drivers of Health   Financial Resource Strain: Low Risk  (12/12/2022)   Overall Financial Resource Strain (CARDIA)    Difficulty of Paying Living Expenses: Not hard at all  Food Insecurity: No Food Insecurity (10/18/2023)   Hunger Vital Sign    Worried About Running Out of Food in the Last Year: Never true    Ran Out of Food  in the Last Year: Never true  Transportation Needs: No Transportation Needs (10/18/2023)   PRAPARE - Administrator, Civil Service (Medical): No    Lack of Transportation (Non-Medical): No  Physical Activity: Inactive (12/12/2022)   Exercise Vital Sign    Days of Exercise per Week: 0 days    Minutes of Exercise per Session: 0 min  Stress: No Stress Concern Present (12/12/2022)   [employer] of Occupational Health - Occupational Stress Questionnaire    Feeling of Stress : Not at all  Social Connections:  Moderately Isolated (12/12/2022)   Social Connection and Isolation Panel    Frequency of Communication with Friends and Family: Three times a week    Frequency of Social Gatherings with Friends and Family: Three times a week    Attends Religious Services: More than 4 times per year    Active Member of Clubs or Organizations: No    Attends Banker Meetings: Never    Marital Status: Widowed    Objective:  BP 124/70   Pulse 92   Temp (!) 97.4 F (36.3 C)   Ht 5' 5 (1.651 m)   Wt 163 lb (73.9 kg)   SpO2 94%   BMI 27.12 kg/m      03/07/2024   11:21 AM 02/13/2024    2:09 PM 02/13/2024    1:14 PM  BP/Weight  Systolic BP 124 114 138  Diastolic BP 70 62 72  Wt. (Lbs) 163  158  BMI 27.12 kg/m2  26.29 kg/m2    Physical Exam Vitals and nursing note reviewed.  Constitutional:      Appearance: Normal appearance.  HENT:     Head: Normocephalic and atraumatic.  Cardiovascular:     Rate and Rhythm: Normal rate and regular rhythm.  Pulmonary:     Effort: Pulmonary effort is normal.     Breath sounds: Normal breath sounds.  Abdominal:     Comments: Hyperactive bowel sounds noted. Mild discomfort on palpation of her abdomen.  Musculoskeletal:        General: Swelling (Mild edema of her lower extremities) present.  Skin:    General: Skin is warm.  Neurological:     General: No focal deficit present.     Mental Status: She is alert.  Psychiatric:        Mood and Affect: Mood normal.         Lab Results  Component Value Date   WBC 7.5 01/01/2024   HGB 12.5 01/01/2024   HCT 37.8 01/01/2024   PLT 298 01/01/2024   GLUCOSE 93 01/01/2024   CHOL 215 (H) 01/01/2024   TRIG 119 01/01/2024   HDL 55 01/01/2024   LDLCALC 139 (H) 01/01/2024   ALT 16 01/01/2024   AST 22 01/01/2024   NA 131 (L) 01/01/2024   K 4.8 01/01/2024   CL 95 (L) 01/01/2024   CREATININE 1.17 (H) 01/01/2024   BUN 17 01/01/2024   CO2 23 01/01/2024   TSH 2.580 01/01/2024   INR 1.0 06/10/2023    HGBA1C 5.6 12/12/2022      Assessment & Plan:  Acute diarrhea  Colitis, acute Assessment & Plan: Recent episode of acute colitis and diarrhea which are slowly resolving. Exam today reveals that she is still weak from her recent illness and hospitalization. No worrisome symptoms today requiring her to have repeat blood work or hospitalization.  Plan: Continue antibiotics Cipro  and Flagyl  until gone - Prescribed Protonix  40 mg to take once daily. Refilled  Bentyl  for abdominal cramping. For her chronic mild pedal edema, she could continue her Lasix  as needed  Advised to report back if any worsening symptoms, to order repeat blood work or reevaluate her in office. To call 911 for any severe symptoms   Other orders -     Tretinoin ; Apply 1 Application topically at bedtime.  Dispense: 45 g; Refill: 0 -     Montelukast  Sodium; Take 1 tablet (10 mg total) by mouth daily as needed (allergies).  Dispense: 90 tablet; Refill: 2 -     Furosemide ; Take 1 tablet (20 mg total) by mouth daily as needed for edema.  Dispense: 90 tablet; Refill: 2 -     Pantoprazole  Sodium; Take 1 tablet (40 mg total) by mouth daily.  Dispense: 30 tablet; Refill: 3 -     Dicyclomine  HCl; Take 1 tablet (20 mg total) by mouth 2 (two) times daily as needed for up to 15 days for spasms.  Dispense: 30 tablet; Refill: 0     Body mass index is 27.12 kg/m.  Assessment and Plan      Meds ordered this encounter  Medications   tretinoin  (RETIN-A ) 0.025 % gel    Sig: Apply 1 Application topically at bedtime.    Dispense:  45 g    Refill:  0   montelukast  (SINGULAIR ) 10 MG tablet    Sig: Take 1 tablet (10 mg total) by mouth daily as needed (allergies).    Dispense:  90 tablet    Refill:  2   furosemide  (LASIX ) 20 MG tablet    Sig: Take 1 tablet (20 mg total) by mouth daily as needed for edema.    Dispense:  90 tablet    Refill:  2   pantoprazole  (PROTONIX ) 40 MG tablet    Sig: Take 1 tablet (40 mg total) by  mouth daily.    Dispense:  30 tablet    Refill:  3   dicyclomine  (BENTYL ) 20 MG tablet    Sig: Take 1 tablet (20 mg total) by mouth 2 (two) times daily as needed for up to 15 days for spasms.    Dispense:  30 tablet    Refill:  0    No orders of the defined types were placed in this encounter.      Follow-up: No follow-ups on file.   An After Visit Summary was printed and given to the patient.  Mariem Skolnick, MD [name] [phone]

## 2024-03-07 NOTE — Telephone Encounter (Signed)
 PA submitted and approved via covermymeds for tretinoin .

## 2024-03-09 DIAGNOSIS — I10 Essential (primary) hypertension: Secondary | ICD-10-CM | POA: Diagnosis not present

## 2024-03-09 DIAGNOSIS — Z955 Presence of coronary angioplasty implant and graft: Secondary | ICD-10-CM | POA: Diagnosis not present

## 2024-03-09 DIAGNOSIS — K219 Gastro-esophageal reflux disease without esophagitis: Secondary | ICD-10-CM | POA: Diagnosis not present

## 2024-03-09 DIAGNOSIS — E039 Hypothyroidism, unspecified: Secondary | ICD-10-CM | POA: Diagnosis not present

## 2024-03-09 DIAGNOSIS — F32A Depression, unspecified: Secondary | ICD-10-CM | POA: Diagnosis not present

## 2024-03-09 DIAGNOSIS — Z7985 Long-term (current) use of injectable non-insulin antidiabetic drugs: Secondary | ICD-10-CM | POA: Diagnosis not present

## 2024-03-09 DIAGNOSIS — K529 Noninfective gastroenteritis and colitis, unspecified: Secondary | ICD-10-CM | POA: Diagnosis not present

## 2024-03-09 DIAGNOSIS — Z7951 Long term (current) use of inhaled steroids: Secondary | ICD-10-CM | POA: Diagnosis not present

## 2024-03-09 DIAGNOSIS — J449 Chronic obstructive pulmonary disease, unspecified: Secondary | ICD-10-CM | POA: Diagnosis not present

## 2024-03-09 DIAGNOSIS — G894 Chronic pain syndrome: Secondary | ICD-10-CM | POA: Diagnosis not present

## 2024-03-09 DIAGNOSIS — F411 Generalized anxiety disorder: Secondary | ICD-10-CM | POA: Diagnosis not present

## 2024-03-09 DIAGNOSIS — K209 Esophagitis, unspecified without bleeding: Secondary | ICD-10-CM | POA: Diagnosis not present

## 2024-03-09 DIAGNOSIS — Z87440 Personal history of urinary (tract) infections: Secondary | ICD-10-CM | POA: Diagnosis not present

## 2024-03-09 DIAGNOSIS — Z79891 Long term (current) use of opiate analgesic: Secondary | ICD-10-CM | POA: Diagnosis not present

## 2024-03-09 NOTE — Assessment & Plan Note (Signed)
 Recent episode of acute colitis and diarrhea which are slowly resolving. Exam today reveals that she is still weak from her recent illness and hospitalization. No worrisome symptoms today requiring her to have repeat blood work or hospitalization.  Plan: Continue antibiotics Cipro  and Flagyl  until gone - Prescribed Protonix  40 mg to take once daily. Refilled Bentyl  for abdominal cramping. For her chronic mild pedal edema, she could continue her Lasix  as needed  Advised to report back if any worsening symptoms, to order repeat blood work or reevaluate her in office. To call 911 for any severe symptoms

## 2024-03-11 ENCOUNTER — Ambulatory Visit: Admitting: Urology

## 2024-03-12 ENCOUNTER — Telehealth: Payer: Self-pay

## 2024-03-12 DIAGNOSIS — I10 Essential (primary) hypertension: Secondary | ICD-10-CM | POA: Diagnosis not present

## 2024-03-12 DIAGNOSIS — J449 Chronic obstructive pulmonary disease, unspecified: Secondary | ICD-10-CM | POA: Diagnosis not present

## 2024-03-12 DIAGNOSIS — F411 Generalized anxiety disorder: Secondary | ICD-10-CM | POA: Diagnosis not present

## 2024-03-12 DIAGNOSIS — K529 Noninfective gastroenteritis and colitis, unspecified: Secondary | ICD-10-CM | POA: Diagnosis not present

## 2024-03-12 DIAGNOSIS — F32A Depression, unspecified: Secondary | ICD-10-CM | POA: Diagnosis not present

## 2024-03-12 DIAGNOSIS — K209 Esophagitis, unspecified without bleeding: Secondary | ICD-10-CM | POA: Diagnosis not present

## 2024-03-12 NOTE — Telephone Encounter (Signed)
 Copied from CRM [id number]. Topic: Clinical - Medication Question >> Mar 12, 2024  2:20 PM Dedra B wrote: Reason for CRM: Pt would like something called in for thrush.

## 2024-03-12 NOTE — Telephone Encounter (Signed)
 Verbal given for the orders below.    Copied from CRM #[id number]. Topic: Clinical - Home Health Verbal Orders >> Mar 12, 2024 11:48 AM Wess RAMAN wrote: Caller/Agency: [facility]  Callback Number: [phone]  Service Requested: Physical Therapy  Frequency: 1 week 2; 2 week 3; 1 week 3  Any new concerns about the patient? Yes, daughter is concerned about jerking movements that started yesterday

## 2024-03-13 ENCOUNTER — Encounter: Payer: Self-pay | Admitting: Family Medicine

## 2024-03-13 ENCOUNTER — Other Ambulatory Visit: Payer: Self-pay

## 2024-03-13 DIAGNOSIS — M5416 Radiculopathy, lumbar region: Secondary | ICD-10-CM | POA: Diagnosis not present

## 2024-03-13 DIAGNOSIS — M961 Postlaminectomy syndrome, not elsewhere classified: Secondary | ICD-10-CM | POA: Diagnosis not present

## 2024-03-13 MED ORDER — FLUCONAZOLE 150 MG PO TABS
150.0000 mg | ORAL_TABLET | Freq: Once | ORAL | 0 refills | Status: AC
Start: 2024-03-13 — End: 2024-03-13

## 2024-03-13 NOTE — Progress Notes (Signed)
 Per Dr. Sherre send Diflucan  150 mg #7 to pharmacy .

## 2024-03-14 ENCOUNTER — Other Ambulatory Visit: Payer: Self-pay | Admitting: Family Medicine

## 2024-03-14 DIAGNOSIS — B370 Candidal stomatitis: Secondary | ICD-10-CM

## 2024-03-14 MED ORDER — FLUCONAZOLE 150 MG PO TABS: 150.0000 mg | ORAL_TABLET | Freq: Every day | ORAL | 0 refills | Status: DC

## 2024-03-15 DIAGNOSIS — I10 Essential (primary) hypertension: Secondary | ICD-10-CM | POA: Diagnosis not present

## 2024-03-15 DIAGNOSIS — F411 Generalized anxiety disorder: Secondary | ICD-10-CM | POA: Diagnosis not present

## 2024-03-15 DIAGNOSIS — J449 Chronic obstructive pulmonary disease, unspecified: Secondary | ICD-10-CM | POA: Diagnosis not present

## 2024-03-15 DIAGNOSIS — K529 Noninfective gastroenteritis and colitis, unspecified: Secondary | ICD-10-CM | POA: Diagnosis not present

## 2024-03-15 DIAGNOSIS — K209 Esophagitis, unspecified without bleeding: Secondary | ICD-10-CM | POA: Diagnosis not present

## 2024-03-15 DIAGNOSIS — F32A Depression, unspecified: Secondary | ICD-10-CM | POA: Diagnosis not present

## 2024-03-17 DIAGNOSIS — R350 Frequency of micturition: Secondary | ICD-10-CM | POA: Diagnosis not present

## 2024-03-17 DIAGNOSIS — R0600 Dyspnea, unspecified: Secondary | ICD-10-CM | POA: Diagnosis not present

## 2024-03-17 DIAGNOSIS — M545 Low back pain, unspecified: Secondary | ICD-10-CM | POA: Diagnosis not present

## 2024-03-17 DIAGNOSIS — R1084 Generalized abdominal pain: Secondary | ICD-10-CM | POA: Diagnosis not present

## 2024-03-17 DIAGNOSIS — R300 Dysuria: Secondary | ICD-10-CM | POA: Diagnosis not present

## 2024-03-18 ENCOUNTER — Telehealth: Payer: Self-pay | Admitting: Family Medicine

## 2024-03-18 NOTE — Telephone Encounter (Signed)
 [facility] [phone]

## 2024-03-19 DIAGNOSIS — K529 Noninfective gastroenteritis and colitis, unspecified: Secondary | ICD-10-CM | POA: Diagnosis not present

## 2024-03-19 DIAGNOSIS — I10 Essential (primary) hypertension: Secondary | ICD-10-CM | POA: Diagnosis not present

## 2024-03-19 DIAGNOSIS — F411 Generalized anxiety disorder: Secondary | ICD-10-CM | POA: Diagnosis not present

## 2024-03-19 DIAGNOSIS — J449 Chronic obstructive pulmonary disease, unspecified: Secondary | ICD-10-CM | POA: Diagnosis not present

## 2024-03-19 DIAGNOSIS — K209 Esophagitis, unspecified without bleeding: Secondary | ICD-10-CM | POA: Diagnosis not present

## 2024-03-19 DIAGNOSIS — F32A Depression, unspecified: Secondary | ICD-10-CM | POA: Diagnosis not present

## 2024-03-20 ENCOUNTER — Other Ambulatory Visit: Payer: Self-pay | Admitting: Neurosurgery

## 2024-03-20 DIAGNOSIS — K219 Gastro-esophageal reflux disease without esophagitis: Secondary | ICD-10-CM | POA: Diagnosis not present

## 2024-03-20 DIAGNOSIS — K529 Noninfective gastroenteritis and colitis, unspecified: Secondary | ICD-10-CM | POA: Diagnosis not present

## 2024-03-20 DIAGNOSIS — Z7985 Long-term (current) use of injectable non-insulin antidiabetic drugs: Secondary | ICD-10-CM | POA: Diagnosis not present

## 2024-03-20 DIAGNOSIS — K209 Esophagitis, unspecified without bleeding: Secondary | ICD-10-CM | POA: Diagnosis not present

## 2024-03-20 DIAGNOSIS — Z955 Presence of coronary angioplasty implant and graft: Secondary | ICD-10-CM | POA: Diagnosis not present

## 2024-03-20 DIAGNOSIS — G894 Chronic pain syndrome: Secondary | ICD-10-CM | POA: Diagnosis not present

## 2024-03-20 DIAGNOSIS — E039 Hypothyroidism, unspecified: Secondary | ICD-10-CM | POA: Diagnosis not present

## 2024-03-20 DIAGNOSIS — F411 Generalized anxiety disorder: Secondary | ICD-10-CM | POA: Diagnosis not present

## 2024-03-20 DIAGNOSIS — F32A Depression, unspecified: Secondary | ICD-10-CM | POA: Diagnosis not present

## 2024-03-20 DIAGNOSIS — I10 Essential (primary) hypertension: Secondary | ICD-10-CM | POA: Diagnosis not present

## 2024-03-20 DIAGNOSIS — J449 Chronic obstructive pulmonary disease, unspecified: Secondary | ICD-10-CM | POA: Diagnosis not present

## 2024-03-20 DIAGNOSIS — Z7951 Long term (current) use of inhaled steroids: Secondary | ICD-10-CM | POA: Diagnosis not present

## 2024-03-22 ENCOUNTER — Other Ambulatory Visit: Payer: Self-pay

## 2024-03-22 DIAGNOSIS — M545 Low back pain, unspecified: Secondary | ICD-10-CM

## 2024-03-22 MED ORDER — DICLOFENAC EPOLAMINE 1.3 % EX PTCH: 1.0000 | MEDICATED_PATCH | Freq: Two times a day (BID) | CUTANEOUS | 5 refills | Status: DC

## 2024-03-25 ENCOUNTER — Ambulatory Visit: Payer: Self-pay

## 2024-03-25 DIAGNOSIS — J449 Chronic obstructive pulmonary disease, unspecified: Secondary | ICD-10-CM | POA: Diagnosis not present

## 2024-03-25 DIAGNOSIS — F411 Generalized anxiety disorder: Secondary | ICD-10-CM | POA: Diagnosis not present

## 2024-03-25 DIAGNOSIS — K529 Noninfective gastroenteritis and colitis, unspecified: Secondary | ICD-10-CM | POA: Diagnosis not present

## 2024-03-25 DIAGNOSIS — I10 Essential (primary) hypertension: Secondary | ICD-10-CM | POA: Diagnosis not present

## 2024-03-25 DIAGNOSIS — F32A Depression, unspecified: Secondary | ICD-10-CM | POA: Diagnosis not present

## 2024-03-25 DIAGNOSIS — K209 Esophagitis, unspecified without bleeding: Secondary | ICD-10-CM | POA: Diagnosis not present

## 2024-03-25 NOTE — Telephone Encounter (Signed)
 Called [facility] to advise them of patient's refusal

## 2024-03-25 NOTE — Telephone Encounter (Signed)
 FYI Only or Action Required?: Action required by provider: clinical question for provider and update on patient condition.  Patient was last seen in primary care on 03/07/2024 by Sirivol, Mamatha, MD.  Called Nurse Triage reporting Loss of Consciousness.  Symptoms began yesterday.  Interventions attempted: Nothing.  Symptoms are: unchanged.  Triage Disposition: Go to ED Now (or PCP Triage)  Patient/caregiver understands and will follow disposition?: No, wishes to speak with PCP          Copied from CRM #[id number]. Topic: Clinical - Red Word Triage >> Mar 25, 2024 10:26 AM Courtney [name] wrote: Red Word that prompted transfer to Nurse Triage: dizziness and loss of appetite fell yesterday, but says that she didnt break anything Reason for Disposition  [1] Age > 50 years AND [2] now alert and feels fine  Answer Assessment - Initial Assessment Questions Courtney [name] (yesterday) Weak & dizzy Doesn't remember what happened--got up at 5am  Sore across her back States she has been hurting there--she sees her surgeon tomorrow Patient states she was dizzy Not much of an appetite Losing weight Hasn't been taking her shot for weight loss Been using her walker Diverticulitis 2-3 weeks ago and hasn't felt good since then Patient states that right now the physical therapist is at their home right now and is checking her vital signs: Vitals at 03/25/2024 at 10:36am: 130/78 --sitting 93 pulse --sitting 90/60 --standing  Patient and her daughter are advised the Emergency Room for these symptoms and that drop in blood pressure when standing Patient refused the ER Patient wanted to see her PCP for blood work to see what could be wrong They are advised again that the ER is recommended but still refused    1. ONSET: How long were you unconscious? (e.g., minutes, seconds) When did it happen?     Patient states she doesn't really remember 2. CONTENT: What happened during the period of  unconsciousness? (e.g., seizure activity)      [place]--pt got herself up 5. RECURRENT SYMPTOM: Have you ever passed out before? If Yes, ask: When was the last time? and What happened that time?      ---- 6. INJURY: Did you hurt yourself when you fell?      Pt states sore across her back 7. CARDIAC SYMPTOMS: Have you had any of the following symptoms: chest pain, difficulty breathing, palpitations?     ---- 8. NEUROLOGIC SYMPTOMS: Have you had any of the following symptoms: headache, numbness, vertigo, weakness?     ----- 9. GI SYMPTOMS: Have you had any of the following symptoms: abdomen pain, vomiting, diarrhea, blood in stools?     [place]--seen pcp since then 10. OTHER SYMPTOMS: Do you have any other symptoms?       Hasn't taken Mounjaro  in 2-3 weeks, loss of appetite, dizzy spells  Protocols used: Fainting-A-AH

## 2024-03-25 NOTE — Telephone Encounter (Signed)
 Called patient and informed her and she stated that the only thing that she hit was her butt. She states she did not hit her head and that she thinks she will be fine! So I reminded the patient again that the provider did recommend that she go to the ER and she stated once again she understands but she thinks she will be fine and the pain across her back she states that she always has.

## 2024-03-26 DIAGNOSIS — M961 Postlaminectomy syndrome, not elsewhere classified: Secondary | ICD-10-CM | POA: Diagnosis not present

## 2024-03-26 DIAGNOSIS — F32A Depression, unspecified: Secondary | ICD-10-CM | POA: Diagnosis not present

## 2024-03-26 DIAGNOSIS — F411 Generalized anxiety disorder: Secondary | ICD-10-CM | POA: Diagnosis not present

## 2024-03-26 DIAGNOSIS — K529 Noninfective gastroenteritis and colitis, unspecified: Secondary | ICD-10-CM | POA: Diagnosis not present

## 2024-03-26 DIAGNOSIS — I10 Essential (primary) hypertension: Secondary | ICD-10-CM | POA: Diagnosis not present

## 2024-03-26 DIAGNOSIS — J449 Chronic obstructive pulmonary disease, unspecified: Secondary | ICD-10-CM | POA: Diagnosis not present

## 2024-03-26 DIAGNOSIS — K209 Esophagitis, unspecified without bleeding: Secondary | ICD-10-CM | POA: Diagnosis not present

## 2024-03-29 ENCOUNTER — Encounter (HOSPITAL_COMMUNITY): Payer: Self-pay

## 2024-03-29 ENCOUNTER — Other Ambulatory Visit (HOSPITAL_COMMUNITY)

## 2024-03-29 NOTE — Pre-Procedure Instructions (Signed)
 Surgical Instructions   Your procedure is scheduled on April 03, 2024. Report to [facility] Main Entrance A at 6:30 A.M., then check in with the Admitting office. Any questions or running late day of surgery: call [phone]  Questions prior to your surgery date: call [phone], Monday-Friday, 8am-4pm. If you experience any cold or flu symptoms such as cough, fever, chills, shortness of breath, etc. between now and your scheduled surgery, please notify us  at the above number.     Remember:  Do not eat or drink after midnight the night before your surgery    Take these medicines the morning of surgery with A SIP OF WATER: amLODipine  (NORVASC )  buPROPion  (WELLBUTRIN  XL)  DULoxetine  (CYMBALTA )  Fluticasone -Umeclidin-Vilant (TRELEGY ELLIPTA ) Inhaler levothyroxine  (SYNTHROID )  metoprolol  tartrate (LOPRESSOR )  pantoprazole  (PROTONIX )    May take these medicines IF NEEDED: albuterol  (VENTOLIN  HFA) 108 (90 Base) MCG/ACT inhaler  ALPRAZolam  (XANAX )  dicyclomine  (BENTYL )  hydrOXYzine  (ATARAX )  naloxone (NARCAN) nasal spray  nitroGLYCERIN  (NITROSTAT ) - if dose taken prior to surgery, please call [phone] oxyCODONE -acetaminophen  (PERCOCET)  tiZANidine  (ZANAFLEX )    Follow your surgeon's instructions on when to stop Aspirin.  If no instructions were given by your surgeon then you will need to call the office to get those instructions.    STOP taking your MOUNJARO  one week prior to surgery. DO NOT take any doses after October 7th.   One week prior to surgery, STOP taking any Aleve, Naproxen, Ibuprofen, Motrin, Advil, Goody's, BC's, all herbal medications, fish oil, and non-prescription vitamins.                     Do NOT Smoke (Tobacco/Vaping) for 24 hours prior to your procedure.  If you use a CPAP at night, you may bring your mask/headgear for your overnight stay.   You will be asked to remove any contacts, glasses, piercing's, hearing aid's, dentures/partials prior  to surgery. Please bring cases for these items if needed.    Patients discharged the day of surgery will not be allowed to drive home, and someone needs to stay with them for 24 hours.  SURGICAL WAITING ROOM VISITATION Patients may have no more than 2 support people in the waiting area - these visitors may rotate.   Pre-op nurse will coordinate an appropriate time for 1 ADULT support person, who may not rotate, to accompany patient in pre-op.  Children under the age of 33 must have an adult with them who is not the patient and must remain in the main waiting area with an adult.  If the patient needs to stay at the hospital during part of their recovery, the visitor guidelines for inpatient rooms apply.  Please refer to the [facility] website for the visitor guidelines for any additional information.   If you received a COVID test during your pre-op visit  it is requested that you wear a mask when out in public, stay away from anyone that may not be feeling well and notify your surgeon if you develop symptoms. If you have been in contact with anyone that has tested positive in the last 10 days please notify you surgeon.      Pre-operative 4 CHG Bathing Instructions   You can play a key role in reducing the risk of infection after surgery. Your skin needs to be as free of germs as possible. You can reduce the number of germs on your skin by washing with CHG (chlorhexidine  gluconate) soap before surgery. CHG is an  antiseptic soap that kills germs and continues to kill germs even after washing.   DO NOT use if you have an allergy to chlorhexidine /CHG or antibacterial soaps. If your skin becomes reddened or irritated, stop using the CHG and notify one of our RNs at [phone].   Please shower with the CHG soap starting 4 days before surgery using the following schedule:     Please keep in mind the following:  DO NOT shave, including legs and underarms, starting the day of your first  shower.   You may shave your face at any point before/day of surgery.  Place clean sheets on your bed the day you start using CHG soap. Use a clean washcloth (not used since being washed) for each shower. DO NOT sleep with pets once you start using the CHG.   CHG Shower Instructions:  Wash your face and private area with normal soap. If you choose to wash your hair, wash first with your normal shampoo.  After you use shampoo/soap, rinse your hair and body thoroughly to remove shampoo/soap residue.  Turn the water OFF and apply  bottle of CHG soap to a CLEAN washcloth.  Apply CHG soap ONLY FROM YOUR NECK DOWN TO YOUR TOES (washing for 3-5 minutes)  DO NOT use CHG soap on face, private areas, open wounds, or sores.  Pay special attention to the area where your surgery is being performed.  If you are having back surgery, having someone wash your back for you may be helpful. Wait 2 minutes after CHG soap is applied, then you may rinse off the CHG soap.  Pat dry with a clean towel  Put on clean clothes/pajamas   If you choose to wear lotion, please use ONLY the CHG-compatible lotions that are listed below.  Additional instructions for the day of surgery:  If you choose, you may shower the morning of surgery with an antibacterial soap.  DO NOT APPLY any lotions, deodorants, cologne, or perfumes.   Do not bring valuables to the hospital. [facility] is not responsible for any belongings/valuables. Do not wear nail polish, gel polish, artificial nails, or any other type of covering on natural nails (fingers and toes) Do not wear jewelry or makeup Put on clean/comfortable clothes.  Please brush your teeth.  Ask your nurse before applying any prescription medications to the skin.     CHG Compatible Lotions   Aveeno Moisturizing lotion  Cetaphil Moisturizing Cream  Cetaphil Moisturizing Lotion  Clairol Herbal Essence Moisturizing Lotion, Dry Skin  Clairol Herbal Essence Moisturizing  Lotion, Extra Dry Skin  Clairol Herbal Essence Moisturizing Lotion, Normal Skin  Curel Age Defying Therapeutic Moisturizing Lotion with Alpha Hydroxy  Curel Extreme Care Body Lotion  Curel Soothing Hands Moisturizing Hand Lotion  Curel Therapeutic Moisturizing Cream, Fragrance-Free  Curel Therapeutic Moisturizing Lotion, Fragrance-Free  Curel Therapeutic Moisturizing Lotion, Original Formula  Eucerin Daily Replenishing Lotion  Eucerin Dry Skin Therapy Plus Alpha Hydroxy Crme  Eucerin Dry Skin Therapy Plus Alpha Hydroxy Lotion  Eucerin Original Crme  Eucerin Original Lotion  Eucerin Plus Crme Eucerin Plus Lotion  Eucerin TriLipid Replenishing Lotion  Keri Anti-Bacterial Hand Lotion  Keri Deep Conditioning Original Lotion Dry Skin Formula Softly Scented  Keri Deep Conditioning Original Lotion, Fragrance Free Sensitive Skin Formula  Keri Lotion Fast Absorbing Fragrance Free Sensitive Skin Formula  Keri Lotion Fast Absorbing Softly Scented Dry Skin Formula  Keri Original Lotion  Keri Skin Renewal Lotion Keri Silky Smooth Lotion  Keri Silky Smooth Sensitive Skin  Lotion  Nivea Body Creamy Conditioning Oil  Nivea Body Extra Enriched Teacher, adult education Moisturizing Lotion Nivea Crme  Nivea Skin Firming Lotion  NutraDerm 30 Skin Lotion  NutraDerm Skin Lotion  NutraDerm Therapeutic Skin Cream  NutraDerm Therapeutic Skin Lotion  ProShield Protective Hand Cream  Provon moisturizing lotion  Please read over the following fact sheets that you were given.

## 2024-03-29 NOTE — Progress Notes (Addendum)
 Daughter Courtney [name] was present during PAT appointment per patient request.  PCP - Dr Abigail Free (return in 3 Mos-04/24/24 appt.) Cardiologist - Dr Hester Loges (last OV 01/02/23 - next return in about 16mo)  Chest x-ray - 07/24/23 EKG - 06/11/23 Stress Test - 12/08/20 CE ECHO - 12/08/20 CE Cardiac Cath - 12/23/20 CE  ICD Pacemaker/Loop - n/a  Sleep Study -  n/a  Pre- Diabetes  Stop Mounjaro  7 days prior to procedure. Do not take any after Tuesday, 03/26/24.  Last dose was on 03/21/24.   Blood Thinner Instructions:  n/a  Aspirin Instructions: Last dose was on 03/28/24.  NPO  Anesthesia review: Yes  STOP now taking any Aspirin (unless otherwise instructed by your surgeon), Aleve, Naproxen, Ibuprofen, Motrin, Advil, Goody's, BC's, all herbal medications, fish oil, and all vitamins.   Coronavirus Screening Do you have any of the following symptoms:  Cough yes/no: No Fever (>100.27F)  yes/no: No Runny nose yes/no: No Sore throat yes/no: No Difficulty breathing/shortness of breath  yes/no: No  Have you traveled in the last 14 days and where? yes/no: No  Patient and Daughter Courtney [name] verbalized understanding of instructions that were given to them at the PAT appointment. Patient was also instructed that they will need to review over the PAT instructions again at home before surgery.

## 2024-04-01 ENCOUNTER — Encounter (HOSPITAL_COMMUNITY): Payer: Self-pay

## 2024-04-01 ENCOUNTER — Encounter (HOSPITAL_COMMUNITY)
Admission: RE | Admit: 2024-04-01 | Discharge: 2024-04-01 | Disposition: A | Discharge status: Home / Self Care | Source: Ambulatory Visit | Attending: Neurosurgery | Admitting: Neurosurgery

## 2024-04-01 ENCOUNTER — Ambulatory Visit: Admitting: Physician Assistant

## 2024-04-01 ENCOUNTER — Other Ambulatory Visit: Payer: Self-pay

## 2024-04-01 ENCOUNTER — Encounter: Payer: Self-pay | Admitting: Physician Assistant

## 2024-04-01 VITALS — BP 117/66 | HR 96 | Temp 98.3°F | Resp 16 | Ht 65.0 in | Wt 151.9 lb

## 2024-04-01 DIAGNOSIS — Z955 Presence of coronary angioplasty implant and graft: Secondary | ICD-10-CM | POA: Diagnosis not present

## 2024-04-01 DIAGNOSIS — F411 Generalized anxiety disorder: Secondary | ICD-10-CM | POA: Diagnosis not present

## 2024-04-01 DIAGNOSIS — B0229 Other postherpetic nervous system involvement: Secondary | ICD-10-CM | POA: Diagnosis not present

## 2024-04-01 DIAGNOSIS — I071 Rheumatic tricuspid insufficiency: Secondary | ICD-10-CM | POA: Insufficient documentation

## 2024-04-01 DIAGNOSIS — I872 Venous insufficiency (chronic) (peripheral): Secondary | ICD-10-CM | POA: Diagnosis not present

## 2024-04-01 DIAGNOSIS — K209 Esophagitis, unspecified without bleeding: Secondary | ICD-10-CM | POA: Diagnosis not present

## 2024-04-01 DIAGNOSIS — J449 Chronic obstructive pulmonary disease, unspecified: Secondary | ICD-10-CM | POA: Diagnosis not present

## 2024-04-01 DIAGNOSIS — Z79899 Other long term (current) drug therapy: Secondary | ICD-10-CM | POA: Insufficient documentation

## 2024-04-01 DIAGNOSIS — K529 Noninfective gastroenteritis and colitis, unspecified: Secondary | ICD-10-CM | POA: Diagnosis not present

## 2024-04-01 DIAGNOSIS — Z01818 Encounter for other preprocedural examination: Secondary | ICD-10-CM

## 2024-04-01 DIAGNOSIS — S0001XA Abrasion of scalp, initial encounter: Secondary | ICD-10-CM

## 2024-04-01 DIAGNOSIS — K219 Gastro-esophageal reflux disease without esophagitis: Secondary | ICD-10-CM | POA: Diagnosis not present

## 2024-04-01 DIAGNOSIS — T07XXXA Unspecified multiple injuries, initial encounter: Secondary | ICD-10-CM

## 2024-04-01 DIAGNOSIS — Z01812 Encounter for preprocedural laboratory examination: Secondary | ICD-10-CM | POA: Insufficient documentation

## 2024-04-01 DIAGNOSIS — I10 Essential (primary) hypertension: Secondary | ICD-10-CM | POA: Diagnosis not present

## 2024-04-01 DIAGNOSIS — I2510 Atherosclerotic heart disease of native coronary artery without angina pectoris: Secondary | ICD-10-CM | POA: Diagnosis not present

## 2024-04-01 DIAGNOSIS — E039 Hypothyroidism, unspecified: Secondary | ICD-10-CM | POA: Insufficient documentation

## 2024-04-01 DIAGNOSIS — F32A Depression, unspecified: Secondary | ICD-10-CM | POA: Diagnosis not present

## 2024-04-01 HISTORY — DX: Anxiety disorder, unspecified: F41.9

## 2024-04-01 HISTORY — DX: Dependence on other enabling machines and devices: Z99.89

## 2024-04-01 HISTORY — DX: Fibromyalgia: M79.7

## 2024-04-01 LAB — CBC
HCT: 37.3 % (ref 36.0–46.0)
Hemoglobin: 12 g/dL (ref 12.0–15.0)
MCH: 32.2 pg (ref 26.0–34.0)
MCHC: 32.2 g/dL (ref 30.0–36.0)
MCV: 100 fL (ref 80.0–100.0)
Platelets: 355 K/uL (ref 150–400)
RBC: 3.73 MIL/uL — ABNORMAL LOW (ref 3.87–5.11)
RDW: 14 % (ref 11.5–15.5)
WBC: 10.6 K/uL — ABNORMAL HIGH (ref 4.0–10.5)
nRBC: 0 % (ref 0.0–0.2)

## 2024-04-01 LAB — BASIC METABOLIC PANEL WITH GFR
Anion gap: 7 (ref 5–15)
BUN: 17 mg/dL (ref 8–23)
CO2: 29 mmol/L (ref 22–32)
Calcium: 9.4 mg/dL (ref 8.9–10.3)
Chloride: 95 mmol/L — ABNORMAL LOW (ref 98–111)
Creatinine, Ser: 1.11 mg/dL — ABNORMAL HIGH (ref 0.44–1.00)
GFR, Estimated: 51 mL/min — ABNORMAL LOW (ref >60)
Glucose, Bld: 85 mg/dL (ref 70–99)
Potassium: 3.9 mmol/L (ref 3.5–5.1)
Sodium: 131 mmol/L — ABNORMAL LOW (ref 135–145)

## 2024-04-01 LAB — SURGICAL PCR SCREEN
MRSA, PCR: NEGATIVE
Staphylococcus aureus: NEGATIVE

## 2024-04-01 MED ORDER — MUPIROCIN 2 % EX OINT: 1.0000 | TOPICAL_OINTMENT | Freq: Two times a day (BID) | CUTANEOUS | 0 refills | Status: DC

## 2024-04-01 MED ORDER — HYDROCORTISONE 2.5 % EX OINT: TOPICAL_OINTMENT | CUTANEOUS | 1 refills | Status: AC

## 2024-04-01 MED ORDER — CLOBETASOL PROPIONATE 0.05 % EX SHAM: 1.0000 | MEDICATED_SHAMPOO | Freq: Two times a day (BID) | CUTANEOUS | 2 refills | Status: DC

## 2024-04-01 MED ORDER — MUPIROCIN 2 % EX OINT: 1.0000 | TOPICAL_OINTMENT | Freq: Two times a day (BID) | CUTANEOUS | 1 refills | Status: DC

## 2024-04-01 MED ORDER — VALACYCLOVIR HCL 1 G PO TABS: 500.0000 mg | ORAL_TABLET | Freq: Two times a day (BID) | ORAL | 1 refills | Status: DC

## 2024-04-01 MED ORDER — CLOBETASOL PROPIONATE 0.05 % EX SOLN: 1.0000 | Freq: Two times a day (BID) | CUTANEOUS | 0 refills | Status: DC

## 2024-04-01 MED ORDER — HYDROCORTISONE 2.5 % EX OINT: TOPICAL_OINTMENT | Freq: Two times a day (BID) | CUTANEOUS | 3 refills | Status: DC

## 2024-04-01 MED ORDER — CLOBETASOL PROPIONATE 0.05 % EX SHAM: MEDICATED_SHAMPOO | CUTANEOUS | 2 refills | Status: AC

## 2024-04-01 MED ORDER — VALACYCLOVIR HCL 1 G PO TABS: ORAL_TABLET | ORAL | 1 refills | Status: AC

## 2024-04-01 NOTE — Progress Notes (Signed)
   New Patient Visit   Subjective  Courtney [name] is a 78 y.o. female NEW PATIENT who presents for the following: scarring from shingles she has last year.   Courtney [name] had shingles last September of 2024 she continues to have issues to include sores and itching. Her PCP put her on gabapentin  that she could not tolerate.   She is here with her daughter. Patient has had both vaccines for shingles.    The following portions of the chart were reviewed this encounter and updated as appropriate: medications, allergies, medical history  Review of Systems:  No other skin or systemic complaints except as noted in HPI or Assessment and Plan.  Objective  Well appearing patient in no apparent distress; mood and affect are within normal limits.  A focused examination was performed of the following areas: Scalp, face and neck.   Relevant exam findings are noted in the Assessment and Plan.    Assessment & Plan   EXCORIATIONS / Post-Herpatic Neuralgia  Treatment Plan: -Clobetasol Scalp Solution to use for break through itching -Clobetasol Shampoo to use on the dry scalp, let sit for 10 minutes, rinse, then follow up regular shampooing. -Mupirocin to use on open wounds  -Valtrex  500 mg to take once daily -Hydrocortisone  2.5% ointment to use on the face - try and avoid picking at scalp  POST HERPETIC NEURALGIA   MULTIPLE EXCORIATIONS    Return in about 3 months (around 07/02/2024).  I, Gordan Beams, CMA, am acting as scribe for Chakita Mcgraw K, PA-C.   Documentation: I have reviewed the above documentation for accuracy and completeness, and I agree with the above.  Saaya Procell K, PA-C

## 2024-04-01 NOTE — Patient Instructions (Signed)

## 2024-04-02 NOTE — Progress Notes (Addendum)
 Anesthesia Chart Review:  78 yo female follows with cardiology at [facility] for history of CAD s/p DES to LAD 12/2020, HTN, vasovagal syncope, chronic venous insufficiency, HLD.  Last seen by Dr. Isidor on 01/02/2023, noted to be euvolemic, asymptomatic from CAD standpoint.  Blood pressure noted to be recently poorly controlled.  Per note, patient had inadvertently stopped several of her antihypertensive medications.  She was instructed to continue amlodipine  5 mg daily, metoprolol  50 mg twice daily, lisinopril  2.5 mg daily.    Other pertinent history includes prior EtOH abuse, headaches, COPD, chronic pain, anxiety/depression, hypothyroidism, GERD on PPI.  Recent admission 9/14 to 03/05/2024 at [facility] after presenting due to episode of nausea, diarrhea, and near syncope. EKG showed sinus rhythm at rate of 100 bpm, normal axis, normal intervals, no LVH, no STEMI, no acute ischemic changes.   Following admission she developed persistent diffuse abdominal pain, body aches, general malaise, and multiple episodes of large-volume loose stools.  CT of the abdomen consistent with long segment distal colitis.  She was rehydrated and treated with Rocephin  and Flagyl  with significant improvement.  Head CT, carotid ultrasound, and echocardiogram were all noted to be normal.  Full results of echo were not available, however, discharge summary states LVEF 65 to 70%.   Patient was instructed to hold Mounjaro  7 days prior to surgery.   Preop labs reviewed, mild hyponatremia sodium 133, mildly elevated creatinine 1.20, otherwise unremarkable.  Addendum: Echocardiogram from [facility] requested and received:  TTE 03/03/2024: Conclusion: Left ventricle: The left ventricular size is normal. The left ventricular ejection fraction is estimated to be 65 to 70%. The left ventricular wall thickness is normal. The left ventricular systolic function is normal. Regional wall motion is  normal. There is stage I diastolic dysfunction (impaired relaxation).  Right ventricle: The right ventricle is normal in size and function.   History reviewed with anesthesiologist Dr. Treen.  Advised okay to proceed as planned barring acute status change.   Cath 12/23/2020 (Care Everywhere): Coronary Findings Diagnostic Dominance: Right  Left Anterior Descending: Prox LAD lesion is 30% stenosed. Mid LAD-1 lesion is 50% stenosed. Mid LAD-2 lesion is 95% stenosed. Culprit lesion. The stenosis was measured by a visual reading.  Left Circumflex: There is mild diffuse disease throughout the vessel.  Right Coronary Artery: There is mild diffuse disease throughout the vessel.   Intervention  Mid LAD-2 lesion: Stent: Drug-eluting stent was successfully placed. The stent used was a SYNERGY XD MONORAIL L32 MM ID2.5 MM DELIVERY SYSTEM 1 ACCESS PORT RADIOPAQUE INFLATE LUMEN SYSTEM CORONARY STENT. Stent was deployed by way of balloon expansion. Maximum pressure: 11 atm. Inflation time: 15 sec. Supplies Used: SYNERGY XD MONORAIL L32 MM ID2.5 MM DELIVERY SYSTEM 1 ACCESS PORT RADIOPAQUE INFLATE LUMEN SYSTEM CORONARY STENT Post-Intervention Lesion Assessment: There is a 0% residual stenosis post intervention.    TTE 12/08/2020 (Care Everywhere): Image Quality  Technically difficult.  Exam quality is limited due to limited echo windows.  The left ventricular size is normal.  There is normal left ventricular wall thickness.  Left ventricular systolic function is normal.  LV ejection fraction = 55-60%.  The left ventricular wall motion is normal.  There is mild to moderate tricuspid regurgitation.  Estimated right ventricular systolic pressure is 37 mmHg.     Lynwood Geofm RIGGERS [facility] Short Stay Center/Anesthesiology Phone [phone] 04/02/2024 3:51 PM

## 2024-04-02 NOTE — Anesthesia Preprocedure Evaluation (Addendum)
 Anesthesia Evaluation  Patient identified by MRN, date of birth, ID band Patient awake    Reviewed: Allergy & Precautions, H&P , NPO status , Patient's Chart, lab work & pertinent test results  Airway Mallampati: II  TM Distance: >3 FB Neck ROM: Full    Dental no notable dental hx. (+) Teeth Intact, Dental Advisory Given, Caps   Pulmonary neg pulmonary ROS, pneumonia   Pulmonary exam normal breath sounds clear to auscultation       Cardiovascular Exercise Tolerance: Good hypertension, + CAD  negative cardio ROS Normal cardiovascular exam+ Valvular Problems/Murmurs  Rhythm:Regular Rate:Normal     Neuro/Psych  Headaches PSYCHIATRIC DISORDERS Anxiety Depression     Neuromuscular disease negative neurological ROS  negative psych ROS   GI/Hepatic negative GI ROS, Neg liver ROS,,,  Endo/Other  negative endocrine ROSHypothyroidism    Renal/GU Renal diseasenegative Renal ROS  negative genitourinary   Musculoskeletal negative musculoskeletal ROS (+) Arthritis ,  Fibromyalgia -  Abdominal   Peds negative pediatric ROS (+)  Hematology negative hematology ROS (+) Blood dyscrasia, anemia   Anesthesia Other Findings   Reproductive/Obstetrics negative OB ROS                              Anesthesia Physical Anesthesia Plan  ASA: 3  Anesthesia Plan: General   Post-op Pain Management: Tylenol  PO (pre-op)* and Minimal or no pain anticipated   Induction: Intravenous  PONV Risk Score and Plan: 3 and Ondansetron , TIVA and Treatment may vary due to age or medical condition  Airway Management Planned: Oral ETT  Additional Equipment: None  Intra-op Plan:   Post-operative Plan: Extubation in OR  Informed Consent: I have reviewed the patients History and Physical, chart, labs and discussed the procedure including the risks, benefits and alternatives for the proposed anesthesia with the patient or  authorized representative who has indicated his/her understanding and acceptance.       Plan Discussed with: CRNA and Anesthesiologist  Anesthesia Plan Comments: (PAT note by Lynwood Hope, PA-C: 78 yo female follows with cardiology at [facility] for history of CAD s/p DES to LAD 12/2020, HTN, vasovagal syncope, chronic venous insufficiency, HLD.  Last seen by Dr. Isidor on 01/02/2023, noted to be euvolemic, asymptomatic from CAD standpoint.  Blood pressure noted to be recently poorly controlled.  Per note, patient had inadvertently stopped several of her antihypertensive medications.  She was instructed to continue amlodipine  5 mg daily, metoprolol  50 mg twice daily, lisinopril  2.5 mg daily.    Other pertinent history includes prior EtOH abuse, headaches, COPD, chronic pain, anxiety/depression, hypothyroidism, GERD on PPI.  Recent admission 9/14 to 03/05/2024 at [facility] after presenting due to episode of nausea, diarrhea, and near syncope. EKG showed sinus rhythm at rate of 100 bpm, normal axis, normal intervals, no LVH, no STEMI, no acute ischemic changes.   Following admission she developed persistent diffuse abdominal pain, body aches, general malaise, and multiple episodes of large-volume loose stools.  CT of the abdomen consistent with long segment distal colitis.  She was rehydrated and treated with Rocephin  and Flagyl  with significant improvement.  Head CT, carotid ultrasound, and echocardiogram were all noted to be normal.  Full results of echo were not available, however, discharge summary states LVEF 65 to 70%.  Patient was instructed to hold Mounjaro  7 days prior to surgery.  Preop labs reviewed, mild hyponatremia sodium 133, mildly elevated creatinine 1.20, otherwise unremarkable.  Addendum: Echocardiogram  from [facility] requested and received:  TTE 03/03/2024: Conclusion: Left ventricle: The left ventricular size is normal. The left ventricular  ejection fraction is estimated to be 65 to 70%. The left ventricular wall thickness is normal. The left ventricular systolic function is normal. Regional wall motion is normal. There is stage I diastolic dysfunction (impaired relaxation).  Right ventricle: The right ventricle is normal in size and function.   History reviewed with anesthesiologist Dr. Treen.  Advised okay to proceed as planned barring acute status change.  Cath 12/23/2020 (Care Everywhere): Coronary Findings Diagnostic Dominance: Right  Left Anterior Descending: Prox LAD lesion is 30% stenosed. Mid LAD-1 lesion is 50% stenosed. Mid LAD-2 lesion is 95% stenosed. Culprit lesion. The stenosis was measured by a visual reading.  Left Circumflex: There is mild diffuse disease throughout the vessel.  Right Coronary Artery: There is mild diffuse disease throughout the vessel.   Intervention  Mid LAD-2 lesion: Stent: Drug-eluting stent was successfully placed. The stent used was a SYNERGY XD MONORAIL L32 MM ID2.5 MM DELIVERY SYSTEM 1 ACCESS PORT RADIOPAQUE INFLATE LUMEN SYSTEM CORONARY STENT. Stent was deployed by way of balloon expansion. Maximum pressure: 11 atm. Inflation time: 15 sec. Supplies Used: SYNERGY XD MONORAIL L32 MM ID2.5 MM DELIVERY SYSTEM 1 ACCESS PORT RADIOPAQUE INFLATE LUMEN SYSTEM CORONARY STENT Post-Intervention Lesion Assessment: There is a 0% residual stenosis post intervention.   TTE 12/08/2020 (Care Everywhere): Image Quality Technically difficult.  Exam quality is limited due to limited echo windows.  The left ventricular size is normal.  There is normal left ventricular wall thickness.  Left ventricular systolic function is normal.  LV ejection fraction = 55-60%.  The left ventricular wall motion is normal.  There is mild to moderate tricuspid regurgitation.  Estimated right ventricular systolic pressure is 37 mmHg.   )         Anesthesia Quick Evaluation

## 2024-04-03 ENCOUNTER — Ambulatory Visit (HOSPITAL_COMMUNITY)
Admission: RE | Admit: 2024-04-03 | Discharge: 2024-04-04 | Disposition: A | Discharge status: Home / Self Care | Attending: Neurosurgery | Admitting: Neurosurgery

## 2024-04-03 ENCOUNTER — Encounter (HOSPITAL_COMMUNITY): Admission: RE | Disposition: A | Payer: Self-pay | Source: Home / Self Care | Attending: Neurosurgery

## 2024-04-03 ENCOUNTER — Other Ambulatory Visit: Payer: Self-pay

## 2024-04-03 ENCOUNTER — Ambulatory Visit (HOSPITAL_COMMUNITY): Payer: Self-pay | Admitting: Certified Registered"

## 2024-04-03 ENCOUNTER — Ambulatory Visit (HOSPITAL_COMMUNITY): Payer: Self-pay | Admitting: Physician Assistant

## 2024-04-03 ENCOUNTER — Ambulatory Visit (HOSPITAL_COMMUNITY)

## 2024-04-03 ENCOUNTER — Encounter (HOSPITAL_COMMUNITY): Payer: Self-pay | Admitting: Neurosurgery

## 2024-04-03 DIAGNOSIS — Z9682 Presence of neurostimulator: Secondary | ICD-10-CM | POA: Diagnosis not present

## 2024-04-03 DIAGNOSIS — M961 Postlaminectomy syndrome, not elsewhere classified: Secondary | ICD-10-CM | POA: Insufficient documentation

## 2024-04-03 DIAGNOSIS — I10 Essential (primary) hypertension: Secondary | ICD-10-CM | POA: Insufficient documentation

## 2024-04-03 DIAGNOSIS — G96198 Other disorders of meninges, not elsewhere classified: Secondary | ICD-10-CM | POA: Insufficient documentation

## 2024-04-03 DIAGNOSIS — M5416 Radiculopathy, lumbar region: Secondary | ICD-10-CM | POA: Diagnosis not present

## 2024-04-03 DIAGNOSIS — M797 Fibromyalgia: Secondary | ICD-10-CM | POA: Insufficient documentation

## 2024-04-03 DIAGNOSIS — N1832 Chronic kidney disease, stage 3b: Secondary | ICD-10-CM

## 2024-04-03 DIAGNOSIS — F419 Anxiety disorder, unspecified: Secondary | ICD-10-CM | POA: Insufficient documentation

## 2024-04-03 DIAGNOSIS — I2510 Atherosclerotic heart disease of native coronary artery without angina pectoris: Secondary | ICD-10-CM | POA: Diagnosis not present

## 2024-04-03 DIAGNOSIS — I471 Supraventricular tachycardia, unspecified: Secondary | ICD-10-CM | POA: Diagnosis not present

## 2024-04-03 DIAGNOSIS — G8929 Other chronic pain: Secondary | ICD-10-CM | POA: Insufficient documentation

## 2024-04-03 DIAGNOSIS — E039 Hypothyroidism, unspecified: Secondary | ICD-10-CM | POA: Diagnosis not present

## 2024-04-03 DIAGNOSIS — F331 Major depressive disorder, recurrent, moderate: Secondary | ICD-10-CM | POA: Insufficient documentation

## 2024-04-03 DIAGNOSIS — I129 Hypertensive chronic kidney disease with stage 1 through stage 4 chronic kidney disease, or unspecified chronic kidney disease: Secondary | ICD-10-CM

## 2024-04-03 HISTORY — PX: THORACIC LAMINECTOMY FOR SPINAL CORD STIMULATOR: SHX6887

## 2024-04-03 SURGERY — THORACIC LAMINECTOMY FOR SPINAL CORD STIMULATOR
Anesthesia: General

## 2024-04-03 MED ORDER — TIZANIDINE HCL 4 MG PO TABS
4.0000 mg | ORAL_TABLET | Freq: Every evening | ORAL | Status: DC | PRN
  Administered 2024-04-03 – 2024-04-04 (×2): 4 mg via ORAL
  Filled 2024-04-03 (×2): qty 1
  Filled 2024-04-03: qty 2

## 2024-04-03 MED ORDER — MONTELUKAST SODIUM 10 MG PO TABS: 10.0000 mg | ORAL_TABLET | Freq: Every day | ORAL | Status: DC | PRN

## 2024-04-03 MED ORDER — ROCURONIUM BROMIDE 10 MG/ML (PF) SYRINGE
PREFILLED_SYRINGE | INTRAVENOUS | Status: DC | PRN
  Administered 2024-04-03: 50 mg via INTRAVENOUS
  Administered 2024-04-03: 10 mg via INTRAVENOUS

## 2024-04-03 MED ORDER — ONDANSETRON HCL 4 MG/2ML IJ SOLN
4.0000 mg | Freq: Once | INTRAMUSCULAR | Status: AC | PRN
  Administered 2024-04-03: 4 mg via INTRAVENOUS

## 2024-04-03 MED ORDER — VALACYCLOVIR HCL 500 MG PO TABS: 500.0000 mg | ORAL_TABLET | Freq: Every day | ORAL | Status: DC

## 2024-04-03 MED ORDER — CLOBETASOL PROPIONATE 0.05 % EX SOLN: 1.0000 | Freq: Two times a day (BID) | CUTANEOUS | Status: DC

## 2024-04-03 MED ORDER — MEPERIDINE HCL 25 MG/ML IJ SOLN: 6.2500 mg | INTRAMUSCULAR | Status: DC | PRN

## 2024-04-03 MED ORDER — OXYCODONE HCL 5 MG PO TABS
5.0000 mg | ORAL_TABLET | Freq: Once | ORAL | Status: AC | PRN
  Administered 2024-04-03: 5 mg via ORAL

## 2024-04-03 MED ORDER — ACETAMINOPHEN 325 MG PO TABS: 650.0000 mg | ORAL_TABLET | ORAL | Status: DC | PRN

## 2024-04-03 MED ORDER — DICYCLOMINE HCL 20 MG PO TABS: 20.0000 mg | ORAL_TABLET | Freq: Two times a day (BID) | ORAL | Status: DC | PRN

## 2024-04-03 MED ORDER — PROPOFOL 10 MG/ML IV BOLUS
INTRAVENOUS | Status: AC
  Filled 2024-04-03: qty 20

## 2024-04-03 MED ORDER — OXYCODONE-ACETAMINOPHEN 5-325 MG PO TABS
1.0000 | ORAL_TABLET | ORAL | Status: DC | PRN
  Administered 2024-04-03: 1 via ORAL
  Filled 2024-04-03: qty 1

## 2024-04-03 MED ORDER — ONDANSETRON HCL 4 MG/2ML IJ SOLN
INTRAMUSCULAR | Status: DC | PRN
  Administered 2024-04-03: 4 mg via INTRAVENOUS

## 2024-04-03 MED ORDER — OXYCODONE HCL 5 MG PO TABS
5.0000 mg | ORAL_TABLET | ORAL | Status: DC | PRN
Start: 2024-04-03 — End: 2024-04-04
  Administered 2024-04-03: 5 mg via ORAL
  Filled 2024-04-03: qty 1

## 2024-04-03 MED ORDER — CHLORHEXIDINE GLUCONATE 0.12 % MT SOLN
15.0000 mL | Freq: Once | OROMUCOSAL | Status: AC
  Administered 2024-04-03: 15 mL via OROMUCOSAL
  Filled 2024-04-03: qty 15

## 2024-04-03 MED ORDER — ROCURONIUM BROMIDE 10 MG/ML (PF) SYRINGE
PREFILLED_SYRINGE | INTRAVENOUS | Status: AC
  Filled 2024-04-03: qty 10

## 2024-04-03 MED ORDER — FENTANYL CITRATE (PF) 250 MCG/5ML IJ SOLN
INTRAMUSCULAR | Status: AC
  Filled 2024-04-03: qty 5

## 2024-04-03 MED ORDER — BACITRACIN ZINC 500 UNIT/GM EX OINT
TOPICAL_OINTMENT | CUTANEOUS | Status: AC
  Filled 2024-04-03: qty 28.35

## 2024-04-03 MED ORDER — CHLORHEXIDINE GLUCONATE CLOTH 2 % EX PADS: 6.0000 | MEDICATED_PAD | Freq: Once | CUTANEOUS | Status: DC

## 2024-04-03 MED ORDER — SODIUM CHLORIDE 0.9% FLUSH
3.0000 mL | Freq: Two times a day (BID) | INTRAVENOUS | Status: DC
  Administered 2024-04-03 – 2024-04-04 (×2): 3 mL via INTRAVENOUS

## 2024-04-03 MED ORDER — LIDOCAINE 2% (20 MG/ML) 5 ML SYRINGE
INTRAMUSCULAR | Status: AC
  Filled 2024-04-03: qty 5

## 2024-04-03 MED ORDER — MENTHOL 3 MG MT LOZG: 1.0000 | LOZENGE | OROMUCOSAL | Status: DC | PRN

## 2024-04-03 MED ORDER — NITROGLYCERIN 0.4 MG SL SUBL: 0.4000 mg | SUBLINGUAL_TABLET | SUBLINGUAL | Status: DC | PRN

## 2024-04-03 MED ORDER — LISINOPRIL 2.5 MG PO TABS
2.5000 mg | ORAL_TABLET | Freq: Every day | ORAL | Status: DC
  Administered 2024-04-04: 2.5 mg via ORAL
  Filled 2024-04-03: qty 1

## 2024-04-03 MED ORDER — LIDOCAINE 2% (20 MG/ML) 5 ML SYRINGE
INTRAMUSCULAR | Status: DC | PRN
  Administered 2024-04-03: 60 mg via INTRAVENOUS

## 2024-04-03 MED ORDER — BUPROPION HCL ER (XL) 300 MG PO TB24
300.0000 mg | ORAL_TABLET | Freq: Every day | ORAL | Status: DC
  Administered 2024-04-04: 300 mg via ORAL
  Filled 2024-04-03: qty 1

## 2024-04-03 MED ORDER — SUGAMMADEX SODIUM 200 MG/2ML IV SOLN
INTRAVENOUS | Status: DC | PRN
  Administered 2024-04-03: 200 mg via INTRAVENOUS

## 2024-04-03 MED ORDER — PANTOPRAZOLE SODIUM 40 MG PO TBEC
40.0000 mg | DELAYED_RELEASE_TABLET | Freq: Every day | ORAL | Status: DC
  Administered 2024-04-03 – 2024-04-04 (×2): 40 mg via ORAL
  Filled 2024-04-03 (×2): qty 1

## 2024-04-03 MED ORDER — DULOXETINE HCL 60 MG PO CPEP
60.0000 mg | ORAL_CAPSULE | Freq: Two times a day (BID) | ORAL | Status: DC
  Administered 2024-04-03 – 2024-04-04 (×2): 60 mg via ORAL
  Filled 2024-04-03 (×2): qty 1

## 2024-04-03 MED ORDER — SODIUM CHLORIDE 0.9 % IV SOLN
250.0000 mL | INTRAVENOUS | Status: DC
  Administered 2024-04-03: 250 mL via INTRAVENOUS

## 2024-04-03 MED ORDER — LEVOTHYROXINE SODIUM 75 MCG PO TABS
75.0000 ug | ORAL_TABLET | Freq: Every day | ORAL | Status: DC
  Administered 2024-04-04: 75 ug via ORAL
  Filled 2024-04-03: qty 1

## 2024-04-03 MED ORDER — BISACODYL 10 MG RE SUPP: 10.0000 mg | Freq: Every day | RECTAL | Status: DC | PRN

## 2024-04-03 MED ORDER — ONDANSETRON HCL 4 MG/2ML IJ SOLN: 4.0000 mg | Freq: Four times a day (QID) | INTRAMUSCULAR | Status: DC | PRN

## 2024-04-03 MED ORDER — BUPIVACAINE-EPINEPHRINE (PF) 0.5% -1:200000 IJ SOLN
INTRAMUSCULAR | Status: AC
  Filled 2024-04-03: qty 30

## 2024-04-03 MED ORDER — OXYCODONE HCL 5 MG/5ML PO SOLN: 5.0000 mg | Freq: Once | ORAL | Status: AC | PRN

## 2024-04-03 MED ORDER — CEFAZOLIN SODIUM-DEXTROSE 2-4 GM/100ML-% IV SOLN
2.0000 g | INTRAVENOUS | Status: AC
  Administered 2024-04-03: 2 g via INTRAVENOUS
  Filled 2024-04-03: qty 100

## 2024-04-03 MED ORDER — ALBUTEROL SULFATE HFA 108 (90 BASE) MCG/ACT IN AERS: 2.0000 | INHALATION_SPRAY | Freq: Four times a day (QID) | RESPIRATORY_TRACT | Status: DC | PRN

## 2024-04-03 MED ORDER — AMLODIPINE BESYLATE 5 MG PO TABS
5.0000 mg | ORAL_TABLET | Freq: Every day | ORAL | Status: DC
  Administered 2024-04-04: 5 mg via ORAL
  Filled 2024-04-03: qty 1

## 2024-04-03 MED ORDER — OXYCODONE-ACETAMINOPHEN 10-325 MG PO TABS
1.0000 | ORAL_TABLET | ORAL | Status: DC | PRN
Start: 2024-04-03 — End: 2024-04-03

## 2024-04-03 MED ORDER — 0.9 % SODIUM CHLORIDE (POUR BTL) OPTIME
TOPICAL | Status: DC | PRN
  Administered 2024-04-03: 1000 mL

## 2024-04-03 MED ORDER — ALBUTEROL SULFATE (2.5 MG/3ML) 0.083% IN NEBU: 2.5000 mg | INHALATION_SOLUTION | Freq: Four times a day (QID) | RESPIRATORY_TRACT | Status: DC | PRN

## 2024-04-03 MED ORDER — VANCOMYCIN HCL 1000 MG IV SOLR: INTRAVENOUS | Status: DC | PRN

## 2024-04-03 MED ORDER — METOPROLOL TARTRATE 50 MG PO TABS
50.0000 mg | ORAL_TABLET | Freq: Two times a day (BID) | ORAL | Status: DC
  Administered 2024-04-03 – 2024-04-04 (×2): 50 mg via ORAL
  Filled 2024-04-03 (×2): qty 1

## 2024-04-03 MED ORDER — PHENOL 1.4 % MT LIQD: 1.0000 | OROMUCOSAL | Status: DC | PRN

## 2024-04-03 MED ORDER — ACETAMINOPHEN 500 MG PO TABS
1000.0000 mg | ORAL_TABLET | Freq: Four times a day (QID) | ORAL | Status: DC
  Administered 2024-04-03 – 2024-04-04 (×3): 1000 mg via ORAL
  Filled 2024-04-03 (×3): qty 2

## 2024-04-03 MED ORDER — CEFAZOLIN SODIUM-DEXTROSE 2-4 GM/100ML-% IV SOLN
2.0000 g | Freq: Three times a day (TID) | INTRAVENOUS | Status: AC
  Administered 2024-04-03 (×2): 2 g via INTRAVENOUS
  Filled 2024-04-03 (×3): qty 100

## 2024-04-03 MED ORDER — ONDANSETRON HCL 4 MG PO TABS: 4.0000 mg | ORAL_TABLET | Freq: Four times a day (QID) | ORAL | Status: DC | PRN

## 2024-04-03 MED ORDER — DOCUSATE SODIUM 100 MG PO CAPS
100.0000 mg | ORAL_CAPSULE | Freq: Two times a day (BID) | ORAL | Status: DC
  Administered 2024-04-03 – 2024-04-04 (×3): 100 mg via ORAL
  Filled 2024-04-03 (×3): qty 1

## 2024-04-03 MED ORDER — MORPHINE SULFATE (PF) 2 MG/ML IV SOLN
2.0000 mg | INTRAVENOUS | Status: DC | PRN
  Administered 2024-04-03 – 2024-04-04 (×4): 4 mg via INTRAVENOUS
  Administered 2024-04-04: 2 mg via INTRAVENOUS
  Filled 2024-04-03 (×2): qty 2
  Filled 2024-04-03: qty 1
  Filled 2024-04-03 (×2): qty 2

## 2024-04-03 MED ORDER — FENTANYL CITRATE (PF) 100 MCG/2ML IJ SOLN
INTRAMUSCULAR | Status: AC
  Filled 2024-04-03: qty 2

## 2024-04-03 MED ORDER — THROMBIN 5000 UNITS EX KIT
PACK | CUTANEOUS | Status: AC
Start: 2024-04-03 — End: 2024-04-03
  Filled 2024-04-03: qty 1

## 2024-04-03 MED ORDER — THROMBIN 5000 UNITS EX SOLR
OROMUCOSAL | Status: DC | PRN
  Administered 2024-04-03: 5 mL via TOPICAL

## 2024-04-03 MED ORDER — ZOLPIDEM TARTRATE 5 MG PO TABS
5.0000 mg | ORAL_TABLET | Freq: Every evening | ORAL | Status: DC | PRN
  Administered 2024-04-03: 5 mg via ORAL
  Filled 2024-04-03: qty 1

## 2024-04-03 MED ORDER — BUDESON-GLYCOPYRROL-FORMOTEROL 160-9-4.8 MCG/ACT IN AERO
2.0000 | INHALATION_SPRAY | Freq: Two times a day (BID) | RESPIRATORY_TRACT | Status: DC
  Administered 2024-04-03 – 2024-04-04 (×2): 2 via RESPIRATORY_TRACT
  Filled 2024-04-03: qty 5.9

## 2024-04-03 MED ORDER — VANCOMYCIN HCL 1000 MG IV SOLR
INTRAVENOUS | Status: AC
  Filled 2024-04-03: qty 20

## 2024-04-03 MED ORDER — SODIUM CHLORIDE 0.9% FLUSH: 3.0000 mL | INTRAVENOUS | Status: DC | PRN

## 2024-04-03 MED ORDER — FUROSEMIDE 20 MG PO TABS
20.0000 mg | ORAL_TABLET | Freq: Every day | ORAL | Status: DC | PRN
Start: 2024-04-03 — End: 2024-04-04

## 2024-04-03 MED ORDER — PROPOFOL 10 MG/ML IV BOLUS
INTRAVENOUS | Status: DC | PRN
  Administered 2024-04-03: 140 mg via INTRAVENOUS

## 2024-04-03 MED ORDER — DEXAMETHASONE SOD PHOSPHATE PF 10 MG/ML IJ SOLN
INTRAMUSCULAR | Status: DC | PRN
  Administered 2024-04-03: 5 mg via INTRAVENOUS

## 2024-04-03 MED ORDER — FENTANYL CITRATE (PF) 250 MCG/5ML IJ SOLN
INTRAMUSCULAR | Status: DC | PRN
  Administered 2024-04-03 (×5): 50 ug via INTRAVENOUS

## 2024-04-03 MED ORDER — BUPIVACAINE-EPINEPHRINE (PF) 0.5% -1:200000 IJ SOLN
INTRAMUSCULAR | Status: DC | PRN
  Administered 2024-04-03: 20 mL

## 2024-04-03 MED ORDER — OXYCODONE HCL 5 MG PO TABS
ORAL_TABLET | ORAL | Status: AC
  Filled 2024-04-03: qty 1

## 2024-04-03 MED ORDER — LACTATED RINGERS IV SOLN: INTRAVENOUS | Status: DC | PRN

## 2024-04-03 MED ORDER — ONDANSETRON HCL 4 MG/2ML IJ SOLN
INTRAMUSCULAR | Status: AC
  Filled 2024-04-03: qty 2

## 2024-04-03 MED ORDER — FENTANYL CITRATE (PF) 100 MCG/2ML IJ SOLN
25.0000 ug | INTRAMUSCULAR | Status: DC | PRN
  Administered 2024-04-03 (×2): 50 ug via INTRAVENOUS

## 2024-04-03 MED ORDER — ACETAMINOPHEN 650 MG RE SUPP: 650.0000 mg | RECTAL | Status: DC | PRN

## 2024-04-03 SURGICAL SUPPLY — 49 items
BAG COUNTER SPONGE SURGICOUNT (BAG) ×2 IMPLANT
BENZOIN TINCTURE PRP APPL 2/3 (GAUZE/BANDAGES/DRESSINGS) ×2 IMPLANT
BLADE CLIPPER SURG (BLADE) IMPLANT
BUR MATCHSTICK NEURO 3.0 LAGG (BURR) ×2 IMPLANT
BUR PRECISION FLUTE 6.0 (BURR) ×2 IMPLANT
CONTROL REMOTE FREELINK ALPHA (NEUROSURGERY SUPPLIES) IMPLANT
DERMABOND ADVANCED .7 DNX12 (GAUZE/BANDAGES/DRESSINGS) IMPLANT
DRAPE C-ARM 42X72 X-RAY (DRAPES) ×2 IMPLANT
DRAPE INCISE IOBAN 85X60 (DRAPES) IMPLANT
DRAPE LAPAROTOMY 100X72X124 (DRAPES) ×2 IMPLANT
DRAPE SURG 17X23 STRL (DRAPES) ×8 IMPLANT
DRSG OPSITE POSTOP 4X6 (GAUZE/BANDAGES/DRESSINGS) IMPLANT
ELECTRODE BLDE 4.0 EZ CLN MEGD (MISCELLANEOUS) ×2 IMPLANT
ELECTRODE REM PT RTRN 9FT ADLT (ELECTROSURGICAL) ×2 IMPLANT
GAUZE 4X4 16PLY ~~LOC~~+RFID DBL (SPONGE) IMPLANT
GLOVE BIO SURGEON STRL SZ8 (GLOVE) ×2 IMPLANT
GLOVE BIO SURGEON STRL SZ8.5 (GLOVE) ×2 IMPLANT
GLOVE EXAM NITRILE XL STR (GLOVE) IMPLANT
GLOVE SURG POLYISO LF SZ6 (GLOVE) ×2 IMPLANT
GLOVE SURG UNDER POLY LF SZ6.5 (GLOVE) ×2 IMPLANT
GOWN STRL REUS W/ TWL LRG LVL3 (GOWN DISPOSABLE) IMPLANT
GOWN STRL REUS W/ TWL XL LVL3 (GOWN DISPOSABLE) IMPLANT
GOWN STRL REUS W/TWL 2XL LVL3 (GOWN DISPOSABLE) IMPLANT
HEMOSTAT POWDER KIT SURGIFOAM (HEMOSTASIS) ×2 IMPLANT
KIT BASIN OR (CUSTOM PROCEDURE TRAY) ×2 IMPLANT
KIT CHARGING PRECISION NEURO (KITS) IMPLANT
KIT IPG ALPHA WAVEWRITER (Stimulator) IMPLANT
KIT TURNOVER KIT B (KITS) ×2 IMPLANT
LEAD COVER EDGE 50CM STIM KIT (Lead) IMPLANT
NDL HYPO 22X1.5 SAFETY MO (MISCELLANEOUS) ×2 IMPLANT
NEEDLE HYPO 22X1.5 SAFETY MO (MISCELLANEOUS) ×1 IMPLANT
PACK LAMINECTOMY NEURO (CUSTOM PROCEDURE TRAY) ×2 IMPLANT
PAD ARMBOARD POSITIONER FOAM (MISCELLANEOUS) ×2 IMPLANT
PASSER ELEVATOR (SPINAL CORD STIMULATOR) IMPLANT
SOLN 0.9% NACL 1000 ML (IV SOLUTION) ×1 IMPLANT
SOLN 0.9% NACL POUR BTL 1000ML (IV SOLUTION) ×2 IMPLANT
SOLN STERILE WATER 1000 ML (IV SOLUTION) ×1 IMPLANT
SOLN STERILE WATER BTL 1000 ML (IV SOLUTION) ×2 IMPLANT
SPONGE T-LAP 4X18 ~~LOC~~+RFID (SPONGE) IMPLANT
STAPLER SKIN PROX 35W (STAPLE) IMPLANT
STRIP CLOSURE SKIN 1/2X4 (GAUZE/BANDAGES/DRESSINGS) ×2 IMPLANT
SUT SILK 0 TIES 10X30 (SUTURE) IMPLANT
SUT SILK 2 0 PERMA HAND 18 BK (SUTURE) ×2 IMPLANT
SUT VIC AB 1 CT1 18XBRD ANBCTR (SUTURE) ×2 IMPLANT
SUT VIC AB 2-0 CP2 18 (SUTURE) ×2 IMPLANT
TAPE STRIPS DRAPE STRL (GAUZE/BANDAGES/DRESSINGS) IMPLANT
TOOL LONG TUNNEL (SPINAL CORD STIMULATOR) IMPLANT
TOWEL GREEN STERILE (TOWEL DISPOSABLE) ×2 IMPLANT
TOWEL GREEN STERILE FF (TOWEL DISPOSABLE) ×2 IMPLANT

## 2024-04-03 NOTE — Op Note (Signed)
 Brief history: The patient is a 78 year old white female whose had multiple back surgeries.  She has had chronic back pain.  She had a successful spinal cord stimulator trial.  She has decided to proceed with placement of a permanent spinal cord stimulator.  Pre-Op diagnosis: Failed back syndrome, lumbago  Postop diagnosis: The same  Procedure: Placement of [employer] spinal cord stimulator via hemilaminectomy  Surgeon: Dr. Chyrl Cedar  Assistant: Duwaine Beck, NP  Anesthesia: General Tracheal  Estimated blood loss: 50 cc  Specimens: None  Drains: None  Complications: None  Description of procedure: The patient was brought to the operating room by the anesthesia team.  General endotracheal anesthesia was induced.  She was carefully turned to the prone position on the Wilson frame.  Her thoracolumbar region was then prepared with Betadine scrub and Betadine solution.  Sterile drapes were applied.  I then injected the area to be incised with Marcaine  with epinephrine  solution.  I used a scalpel to make a linear midline incision over the T9-10 interspace.  I used electrocautery to perform a left sided subperiosteal dissections exposing spinous process and lamina of T8-9 and T9-10.  We confirmed our location with intraoperative fluoroscopy.  I then used a high-speed drill to perform a left T9-10 laminotomy.  I removed the ligamentum flavum with a Kerrison punch.  Is a bit of epidural fibrosis which I dissected away from the thecal sac.  I then attempted to insert the leads to the T7 and T8 level.  The leads deflected to the right and left several times.  I then performed a left T9 hemilaminectomy and removed the T8-9 ligamentum flavum.  I was then able to place the leads in the midline from the bottom of T7 over the T8 vertebral body.  We then secured the leads to the spinous process using a silk suture.  I then made an incision in the patient's left flank above the iliac crest and  below the rib cage.  I tunneled the leads from the thoracic incision to the flank incision.  We created a subcutaneous pocket for the generator.  We then connected the leads to the generator.  Good functioning was confirmed with telemetry.  We secured the leads with the nuts.  We then inserted the generator into the subcutaneous pocket.  We then reapproximated the patient's subcutaneous tissue in both wounds with 2-0 Vicryl suture. We reapproximated the patient's thoracic fascia with interrupted #1 Vicryl suture.  We reapproximate the skin with Steri-Strips and benzoin.  The wound was then coated with bacitracin  ointment.  A sterile dressing was applied.  The drapes were removed.  By report all sponge, instrument, and needle counts were correct at the end of this case.

## 2024-04-03 NOTE — Transfer of Care (Signed)
 Immediate Anesthesia Transfer of Care Note  Patient: Courtney [name]  Procedure(s) Performed: THORACIC SIX LAMINECTOMY FOR SPINAL CORD STIMULATOR  Patient Location: PACU  Anesthesia Type:General  Level of Consciousness: drowsy and patient cooperative  Airway & Oxygen  Therapy: Patient Spontanous Breathing and Patient connected to nasal cannula oxygen   Post-op Assessment: Report given to RN and Post -op Vital signs reviewed and stable  Post vital signs: Reviewed and stable  Last Vitals:  Vitals Value Taken Time  BP 145/114 04/03/24 11:00  Temp    Pulse 83 04/03/24 11:02  Resp 14 04/03/24 11:02  SpO2 98 % 04/03/24 11:02  Vitals shown include unfiled device data.  Last Pain:  Vitals:   04/03/24 0654  TempSrc: Oral  PainSc: 8       Patients Stated Pain Goal: 3 (04/03/24 0654)  Complications: No notable events documented.

## 2024-04-03 NOTE — H&P (Signed)
 Subjective: The patient is a 78 year old white female whose had several back surgeries.  She has had chronic back pain.  He had a spinal cord stimulator trial which was successful.  She is decided to proceed with placement of a permanent spinal cord stimulator.  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   Followed in Pulmonary clinic/ [facility]/ [place]    Cardiac dysrhythmia    Chronic idiopathic constipation    Colitis, acute 02/08/2021   Coronary artery disease    Depression, major, recurrent, moderate (HCC)    Fibromyalgia    Headache    Heart murmur    History of bladder infections    History of COVID-19 2020   Hyperlipidemia    Hypertension    Hypothyroidism    Knee pain, bilateral    Melena 02/08/2021   Osteoarthritis    Pneumonia    x several   Pre-diabetes 03/2021   Primary insomnia    Recovering alcoholic (HCC)    SVT (supraventricular tachycardia)    UTI (urinary tract infection)    Varicose veins    Vitamin B12 deficiency     Past Surgical History:  Procedure Laterality Date   ABDOMINAL HYSTERECTOMY  1991   APPENDECTOMY     BACK SURGERY     had 4 surgeries. Have rods placed in back - last 07/2021   BREAST BIOPSY Left    x2   BREAST ENHANCEMENT SURGERY  2006   CARDIAC CATHETERIZATION  12/22/2020   stents placed   CHOLECYSTECTOMY  12/2019   removed lap band.    COLONOSCOPY  12/01/2016   Moderate predominantly sigmod diverticulosis. Otherwise grossly normal colonoscopy   EYE SURGERY     IOL- bilateral - [place]   KNEE ARTHROSCOPY Left    lap band surgery  1981   LUMBAR FUSION  07/29/2015   posterior level one   removal of cervical disc fragments  10/2007   pt. denies    TUBAL LIGATION  1970    Allergies  Allergen Reactions   Levofloxacin Hives, Other (See Comments), Shortness  Of Breath and Swelling    Tolerates Ciprofloxacin   RASH IN MOUTH   (can take IV route)   Bactrim  [Sulfamethoxazole -Trimethoprim ] Hives and Itching   Clarithromycin      GI upset Other reaction(s): GI Upset (intolerance) GI upset -can take but does cause mild upset    Lyrica [Pregabalin]    Nexletol [Bempedoic Acid]     Muscle pain   Statins Other (See Comments)    Myalgias   Zetia  [Ezetimibe ]     Muscle pain    Social History   Tobacco Use   Smoking status: Never   Smokeless tobacco: Never  Substance Use Topics   Alcohol use: No    Alcohol/week: 0.0 standard drinks of alcohol    Comment: recovery x 20 yrs    Family History  Problem Relation Age of Onset   Heart disease Mother    Heart disease Father    Diabetes Sister    Other Sister        BRAIN TUMOR   Heart disease Brother    Heart disease Brother    Heart disease Brother    Heart disease Brother    Heart disease Brother    Colon cancer  Maternal Aunt    Colon cancer Cousin    Prior to Admission medications   Medication Sig Start Date End Date Taking? Authorizing Provider  acyclovir  ointment (ZOVIRAX ) 5 % Apply 1 Application topically every 4 (four) hours as needed. 01/01/24  Yes Teressa Harrie HERO, FNP  albuterol  (VENTOLIN  HFA) 108 [phone] Base) MCG/ACT inhaler Inhale 2 puffs into the lungs every 6 (six) hours as needed for wheezing or shortness of breath. 07/14/23  Yes Teressa Harrie HERO, FNP  amLODipine  (NORVASC ) 5 MG tablet Take 1 tablet (5 mg total) by mouth daily. 06/15/23  Yes [name], Kirsten, MD  Ascorbic Acid (VITAMIN C) 1000 MG tablet Take 1,000 mg by mouth daily.   Yes [provider]  buPROPion  (WELLBUTRIN  XL) 300 MG 24 hr tablet Take 1 tablet (300 mg total) by mouth daily. 02/13/24  Yes Teressa Harrie HERO, FNP  Cholecalciferol (D3 PO) Take 2 tablets by mouth daily.   Yes [provider]  clobetasol (TEMOVATE) 0.05 % external solution Apply 1 Application topically 2 (two) times daily. 04/01/24  Yes  Sandridge, Brenda K, PA-C  Clobetasol Propionate 0.05 % shampoo Apply to dry scalp - let sit 10 min and then add water, later and rinse --- 2-3 x weekly. 04/01/24  Yes Sandridge, Brenda K, PA-C  DULoxetine  (CYMBALTA ) 60 MG capsule Take 1 capsule (60 mg total) by mouth 2 (two) times daily. 01/16/24  Yes [name], Kirsten, MD  Fluticasone -Umeclidin-Vilant (TRELEGY ELLIPTA ) 100-62.5-25 MCG/ACT AEPB Inhale 1 puff into the lungs daily. 01/29/24 01/28/25 Yes [name], [name], MD  furosemide  (LASIX ) 20 MG tablet Take 1 tablet (20 mg total) by mouth daily as needed for edema. 03/07/24  Yes Sirivol, Mamatha, MD  hydrocortisone  2.5 % ointment Apply to affected areas on face until symptoms improve. 04/01/24  Yes Sandridge, Brenda K, PA-C  hydrOXYzine  (ATARAX ) 10 MG tablet Take 10 mg by mouth daily as needed for itching.   Yes [provider]  levothyroxine  (SYNTHROID ) 75 MCG tablet TAKE 1 TABLET(75 MCG) BY MOUTH DAILY 01/04/24  Yes [name], Kirsten, MD  lisinopril  (ZESTRIL ) 2.5 MG tablet Take 2.5 mg by mouth daily. 04/06/23  Yes [provider]  metoprolol  tartrate (LOPRESSOR ) 50 MG tablet TAKE 1 TABLET(50 MG) BY MOUTH TWICE DAILY WITH MEALS 12/05/23  Yes [name], Kirsten, MD  montelukast  (SINGULAIR ) 10 MG tablet Take 1 tablet (10 mg total) by mouth daily as needed (allergies). Patient taking differently: Take 10 mg by mouth at bedtime. 03/07/24  Yes Sirivol, Mamatha, MD  MOUNJARO  12.5 MG/0.5ML Pen ADMINISTER 12.5 MG UNDER THE SKIN 1 TIME A WEEK FOR 16 DOSES 02/13/24  Yes [name], Kirsten, MD  naloxone [facility]) nasal spray 4 mg/0.1 mL Place 1 spray into the nose once. In case of opioid overdose.   Yes [provider]  nitroGLYCERIN  (NITROSTAT ) 0.4 MG SL tablet DISSOLVE ONE TABLET UNDER TONGUE AS NEEDED FOR CHEST PAIN EVERY 5 MINUTES FOR UP TO 3 DOSES 02/24/23  Yes Sirivol, Mamatha, MD  oxyCODONE -acetaminophen  (PERCOCET) 10-325 MG tablet Take 1 tablet by mouth every 4 (four) hours as needed for pain.   Yes  [provider]  pantoprazole  (PROTONIX ) 40 MG tablet Take 1 tablet (40 mg total) by mouth daily. 03/07/24  Yes Sirivol, Mamatha, MD  REPATHA SURECLICK 140 MG/ML SOAJ Inject 140 mg into the skin every 14 (fourteen) days.   Yes [provider]  tiZANidine  (ZANAFLEX ) 4 MG tablet Take 4 mg by mouth at bedtime as needed for muscle spasms. 06/07/23  Yes [provider]  ALPRAZolam  (XANAX ) 0.25 MG tablet Take 1 tablet (0.25 mg total) by mouth at bedtime as needed for anxiety. 01/01/24   Teressa Harrie HERO, FNP  aspirin EC 81 MG tablet Take 81 mg by mouth daily. Swallow whole.    [provider]  clotrimazole  (MYCELEX ) 10 MG troche Take 1 tablet (10 mg total) by mouth 5 (five) times daily. Patient not taking: No sig reported 02/28/24   [name], Abigail, MD  diclofenac  (FLECTOR ) 1.3 % PTCH Place 1 patch onto the skin 2 (two) times daily. Patient not taking: Reported on 03/29/2024 03/22/24   Teressa Harrie HERO, FNP  dicyclomine  (BENTYL ) 20 MG tablet Take 1 tablet (20 mg total) by mouth 2 (two) times daily as needed for up to 15 days for spasms. 03/07/24 03/29/24  Sirivol, Mamatha, MD  lidocaine -prilocaine  (EMLA ) cream Apply 1 Application topically 4 (four) times daily as needed (pain). Patient not taking: Reported on 03/29/2024 10/18/23   CoxAbigail, MD  mupirocin ointment (BACTROBAN) 2 % Apply 1 Application topically 2 (two) times daily. 04/01/24   Sandridge, Brenda K, PA-C  tretinoin  (RETIN-A ) 0.025 % gel Apply 1 Application topically at bedtime. Patient not taking: Reported on 03/29/2024 03/07/24   Sirivol, Mamatha, MD  valACYclovir  (VALTREX ) 1000 MG tablet Take one table daily 04/01/24   Sandridge, Brenda K, PA-C     Review of Systems  Positive ROS: As above  All other systems have been reviewed and were otherwise negative with the exception of those mentioned in the HPI and as above.  Objective: Vital signs in last 24 hours: Temp:  [97.8 F (36.6 C)] 97.8 F (36.6 C)  (10/15 0654) Pulse Rate:  [82] 82 (10/15 0654) BP: (134)/(78) 134/78 (10/15 0654) SpO2:  [95 %] 95 % (10/15 0654) Weight:  [68.5 kg] 68.5 kg (10/15 0654) Estimated body mass index is 25.13 kg/m as calculated from the following:   Height as of this encounter: 5' 5 (1.651 m).   Weight as of this encounter: 68.5 kg.   General Appearance: Alert Head: Normocephalic, without obvious abnormality, atraumatic Eyes: PERRL, conjunctiva/corneas clear, EOM's intact,    Ears: Normal  Throat: Normal  Neck: Supple, Back: Scoliosis, her lumbar incisions well-healed Lungs: Clear to auscultation bilaterally, respirations unlabored Heart: Regular rate and rhythm, no murmur, rub or gallop Abdomen: Soft, non-tender Extremities: Extremities normal, atraumatic, no cyanosis or edema Skin: unremarkable  NEUROLOGIC:   Mental status: alert and oriented,Motor Exam - grossly normal Sensory Exam - grossly normal Reflexes:  Coordination - grossly normal Gait - grossly normal Balance - grossly normal Cranial Nerves: I: smell Not tested  II: visual acuity  OS: Normal  OD: Normal   II: visual fields Full to confrontation  II: pupils Equal, round, reactive to light  III,VII: ptosis None  III,IV,VI: extraocular muscles  Full ROM  V: mastication Normal  V: facial light touch sensation  Normal  V,VII: corneal reflex  Present  VII: facial muscle function - upper  Normal  VII: facial muscle function - lower Normal  VIII: hearing Not tested  IX: soft palate elevation  Normal  IX,X: gag reflex Present  XI: trapezius strength  5/5  XI: sternocleidomastoid strength 5/5  XI: neck flexion strength  5/5  XII: tongue strength  Normal    Data Review Lab Results  Component Value Date   WBC 10.6 (H) 04/01/2024   HGB 12.0 04/01/2024   HCT 37.3 04/01/2024   MCV 100.0 04/01/2024   PLT 355 04/01/2024   Lab  Results  Component Value Date   NA 131 (L) 04/01/2024   K 3.9 04/01/2024   CL 95 (L) 04/01/2024    CO2 29 04/01/2024   BUN 17 04/01/2024   CREATININE 1.11 (H) 04/01/2024   GLUCOSE 85 04/01/2024   Lab Results  Component Value Date   INR 1.0 06/10/2023    Assessment/Plan: Failed back syndrome, chronic back pain: I discussed the situation with the patient.  We discussed the various treatment options.  She had good relief with the placement of a temporary spinal cord stimulator.  We discussed the procedure to place a permanent spinal cord stimulator.  I have shown her surgical models.  We have discussed the risk, benefits, alternatives, expected postop course, and likelihood of achieving her goals with surgery.  She has decided to proceed with surgery.   Courtney [name] Budge 04/03/2024 8:04 AM

## 2024-04-03 NOTE — Anesthesia Procedure Notes (Signed)
 Procedure Name: Intubation Date/Time: 04/03/2024 8:38 AM  Performed by: Marva Lonni PARAS, CRNAPre-anesthesia Checklist: Patient identified, Emergency Drugs available, Suction available and Patient being monitored Patient Re-evaluated:Patient Re-evaluated prior to induction Oxygen  Delivery Method: Circle System Utilized Preoxygenation: Pre-oxygenation with 100% oxygen  Induction Type: IV induction Ventilation: Mask ventilation without difficulty Laryngoscope Size: Mac and 3 Grade View: Grade I Tube type: Oral Tube size: 7.0 mm Number of attempts: 1 Airway Equipment and Method: Stylet and Oral airway Placement Confirmation: ETT inserted through vocal cords under direct vision, positive ETCO2 and breath sounds checked- equal and bilateral Secured at: 21 cm Tube secured with: Tape Dental Injury: Teeth and Oropharynx as per pre-operative assessment

## 2024-04-03 NOTE — Anesthesia Postprocedure Evaluation (Signed)
 Anesthesia Post Note  Patient: Courtney [name]  Procedure(s) Performed: THORACIC SIX LAMINECTOMY FOR SPINAL CORD STIMULATOR     Patient location during evaluation: PACU Anesthesia Type: General Level of consciousness: awake and alert Pain management: pain level controlled Vital Signs Assessment: post-procedure vital signs reviewed and stable Respiratory status: spontaneous breathing, nonlabored ventilation, respiratory function stable and patient connected to nasal cannula oxygen  Cardiovascular status: blood pressure returned to baseline and stable Postop Assessment: no apparent nausea or vomiting Anesthetic complications: no   No notable events documented.  Last Vitals:  Vitals:   04/03/24 1115 04/03/24 1130  BP: (!) 159/82 (!) 161/87  Pulse: 85 84  Resp: 14 16  Temp:    SpO2: 98% 98%    Last Pain:  Vitals:   04/03/24 1145  TempSrc:   PainSc: 0-No pain    LLE Motor Response: Purposeful movement (04/03/24 1145) LLE Sensation: Full sensation (04/03/24 1145) RLE Motor Response: Purposeful movement (04/03/24 1145) RLE Sensation: Full sensation (04/03/24 1145)      Darien Mignogna

## 2024-04-04 ENCOUNTER — Encounter (HOSPITAL_COMMUNITY): Payer: Self-pay | Admitting: Neurosurgery

## 2024-04-04 DIAGNOSIS — I10 Essential (primary) hypertension: Secondary | ICD-10-CM | POA: Diagnosis not present

## 2024-04-04 DIAGNOSIS — G96198 Other disorders of meninges, not elsewhere classified: Secondary | ICD-10-CM | POA: Diagnosis not present

## 2024-04-04 DIAGNOSIS — G8929 Other chronic pain: Secondary | ICD-10-CM | POA: Diagnosis not present

## 2024-04-04 DIAGNOSIS — M961 Postlaminectomy syndrome, not elsewhere classified: Secondary | ICD-10-CM | POA: Diagnosis not present

## 2024-04-04 DIAGNOSIS — F331 Major depressive disorder, recurrent, moderate: Secondary | ICD-10-CM | POA: Diagnosis not present

## 2024-04-04 DIAGNOSIS — I2510 Atherosclerotic heart disease of native coronary artery without angina pectoris: Secondary | ICD-10-CM | POA: Diagnosis not present

## 2024-04-04 MED ORDER — HYDROCODONE-ACETAMINOPHEN 5-325 MG PO TABS: 1.0000 | ORAL_TABLET | ORAL | 0 refills | Status: AC | PRN

## 2024-04-04 MED ORDER — DOCUSATE SODIUM 100 MG PO CAPS: 100.0000 mg | ORAL_CAPSULE | Freq: Two times a day (BID) | ORAL | 0 refills | Status: AC

## 2024-04-04 MED ORDER — HYDROCODONE-ACETAMINOPHEN 5-325 MG PO TABS: 1.0000 | ORAL_TABLET | ORAL | Status: DC | PRN

## 2024-04-04 NOTE — Plan of Care (Signed)

## 2024-04-04 NOTE — Progress Notes (Signed)
 Explained discharge instructions to patient. Reviewed follow up appointment and next medication administration times. Also reviewed education. Patient verbalized having an understanding for instructions given. All belongings are in the patient's possession. IV removed. CCMD was notified. No other needs verbalized. Transported downstairs discharge lounge to wait for ride.

## 2024-04-04 NOTE — Evaluation (Signed)
 Physical Therapy Brief Evaluation and Discharge Note Patient Details Name: Courtney [name] MRN: [id number] DOB: 29-Jul-1945 Today's Date: 04/04/2024   History of Present Illness  Pt is a 78 y.o. female who presented to [facility]/03/24 for placement of [employer] spinal cord stimulator via hemilaminectomy. PMHx: lumbago, anemia, anxiety, depression, arthritis, CAD, fibromyalgia, HTN, HLD, OA, pre-DM, and SVT.   Clinical Impression  Pt greeted supine in bed, pleasant and agreeable to PT evaluation. PTA, pt was independent with functional mobility, ADLs/IADLs, driving, and retired. She reports 1 fall last week, which led her to start using rollator for additional support/stability. Reviewed pt's back precautions with her recalling 3/3. Educated pt on log roll technique and cued sequencing. She adhered to back precautions throughout mobility. Overall, pt was supervision for functional mobility without an AD. She ambulated ~255ft within hallway using a step-through gait pattern. Pt is limited by pain, LLE weakness, reduced balance, and decreased activity tolerance. Recommend HHPT to increase strength, improve balance, decrease fall risk, advance activity tolerance, and optimize safety within the home environment. Pt feels ready and safe for discharge. I have answered all her questions related to mobility.     PT Assessment All further PT needs can be met in the next venue of care  Assistance Needed at Discharge  PRN    Equipment Recommendations None recommended by PT  Recommendations for Other Services       Precautions/Restrictions Precautions Precautions: Back;Fall Precaution Booklet Issued: Yes (comment) Recall of Precautions/Restrictions: Intact Precaution/Restrictions Comments: Educated pt on back precautions: No bending, arching, twisting, or lifting >10lbs. She recalled 3/3 at end of session. Restrictions Weight Bearing Restrictions Per Provider Order: No        Mobility  Bed  Mobility Rolling: Supervision Supine/Sidelying to sit: Supervision   General bed mobility comments: Educated pt on log roll technique. Cued sequencing. She took increased time to complete mobility.  Transfers Overall transfer level: Needs assistance Equipment used: None Transfers: Sit to/from Stand, Bed to chair/wheelchair/BSC Sit to Stand: Supervision   Step pivot transfers: Supervision       General transfer comment: Pt stood from lowest bed height pushing up with BUE support. She transferred to recliner chair within room. Good eccentric control.    Ambulation/Gait Ambulation/Gait assistance: Supervision Gait Distance (Feet): 200 Feet Assistive device: None Gait Pattern/deviations: Step-through pattern, Decreased stride length Gait Speed: Pace WFL General Gait Details: Pt ambualted with a reciprocal gait pattern, even weight shift, equal arm swing, and good foot clearance. She navigated room/hallway well without LOB.  Home Activity Instructions    Stairs            Modified Rankin (Stroke Patients Only)        Balance Overall balance assessment: Mild deficits observed, not formally tested                        Pertinent Vitals/Pain PT - Brief Vital Signs All Vital Signs Stable: Yes Pain Assessment Pain Assessment: 0-10 Pain Score: 4  Pain Location: Back Pain Descriptors / Indicators: Sore, Discomfort Pain Intervention(s): Monitored during session, Limited activity within patient's tolerance, Repositioned     Home Living Family/patient expects to be discharged to:: Private residence Living Arrangements: Alone Available Help at Discharge: Family;Friend(s);Personal care attendant;Available PRN/intermittently Home Environment: Level entry   Home Equipment: Rolling Walker (2 wheels);Rollator (4 wheels);Cane - single point;Shower seat;Grab bars - toilet;Grab bars - tub/shower;Hand held shower head;Wheelchair - manual;Wheelchair - Technical brewer  Prior Function Level of Independence: Independent Comments: Ambulates without an AD. Pt reports 1 fall last week d/t dizziness from changing positions too quickly. She reports using rollator for safety and comfort. Indep with ADLs. Manages her own medications. Has a cleaner 1x/wk. Has an individual who aids with meal prep every day. She is able to cook, but primarily reheats meals. Drives. Retired Charity fundraiser.    UE/LE Assessment   UE ROM/Strength/Tone/Coordination: WFL    LE ROM/Strength/Tone/Coordination: Impaired LE ROM/Strength/Tone/Coordination Deficits: LLE weakness: hip flex 3/5, knee ext 3+/5, knee flex 3+/5, ankle DF 4/5, ankle PF 4/5.    Communication   Communication Communication: No apparent difficulties     Cognition Overall Cognitive Status: Appears within functional limits for tasks assessed/performed       General Comments      Exercises     Assessment/Plan    PT Problem List Decreased strength;Decreased activity tolerance;Decreased balance;Decreased mobility       PT Visit Diagnosis Muscle weakness (generalized) (M62.81);Other abnormalities of gait and mobility (R26.89)    No Skilled PT     Co-evaluation                AMPAC 6 Clicks Help needed turning from your back to your side while in a flat bed without using bedrails?: A Little Help needed moving from lying on your back to sitting on the side of a flat bed without using bedrails?: A Little Help needed moving to and from a bed to a chair (including a wheelchair)?: A Little Help needed standing up from a chair using your arms (e.g., wheelchair or bedside chair)?: A Little Help needed to walk in hospital room?: A Little Help needed climbing 3-5 steps with a railing? : A Lot 6 Click Score: 17      End of Session   Activity Tolerance: Patient tolerated treatment well Patient left: in chair;with call bell/phone within reach Nurse Communication: Mobility status PT Visit Diagnosis:  Muscle weakness (generalized) (M62.81);Other abnormalities of gait and mobility (R26.89)     Time: 9150-9084 PT Time Calculation (min) (ACUTE ONLY): 26 min  Charges:   PT Evaluation $PT Eval Low Complexity: 1 Low PT Treatments $Therapeutic Activity: 8-22 mins    Randall SAUNDERS, PT, DPT Acute Rehabilitation Services Office: [phone] Secure Chat Preferred  Delon CHRISTELLA Callander  04/04/2024, 9:20 AM

## 2024-04-04 NOTE — Plan of Care (Signed)
  Problem: Education: Goal: Knowledge of General Education information will improve Description: Including pain rating scale, medication(s)/side effects and non-pharmacologic comfort measures Outcome: Progressing   Problem: Health Behavior/Discharge Planning: Goal: Ability to manage health-related needs will improve Outcome: Progressing   Problem: Activity: Goal: Risk for activity intolerance will decrease Outcome: Progressing   Problem: Nutrition: Goal: Adequate nutrition will be maintained Outcome: Progressing   Problem: Coping: Goal: Level of anxiety will decrease Outcome: Progressing   Problem: Pain Managment: Goal: General experience of comfort will improve and/or be controlled Outcome: Progressing   Problem: Safety: Goal: Ability to remain free from injury will improve Outcome: Progressing

## 2024-04-04 NOTE — Discharge Summary (Signed)
 Physician Discharge Summary  Patient ID: THEOLA CUELLAR MRN: [id number] DOB/AGE: 1945-10-15 78 y.o.  Admit date: 04/03/2024 Discharge date: 04/04/2024  Admission Diagnoses: Failed back syndrome, lumbago  Discharge Diagnoses: The same Principal Problem:   Failed back syndrome of lumbar spine   Discharged Condition: good  Hospital Course: I placed a [employer] spinal cord stimulator in the patient on 04/03/2024.  The surgery went well.  The postoperative course was unremarkable.  On postoperative day #1 she felt well and requested discharge home.  She was given verbal and written discharge instructions.  All her questions were answered.  Consults: PT, care management Significant Diagnostic Studies: None Treatments: Placement of [employer] spinal cord stimulator Discharge Exam: Blood pressure (!) 141/67, pulse 89, temperature 98.7 F (37.1 C), temperature source Oral, resp. rate 18, height 5' 5 (1.651 m), weight 68.5 kg, SpO2 95%. The patient is alert and pleasant.  Her strength is normal.  Her dressing has a small blood stain.  Disposition: Home  Discharge Instructions     Call MD for:  difficulty breathing, headache or visual disturbances   Complete by: As directed    Call MD for:  extreme fatigue   Complete by: As directed    Call MD for:  hives   Complete by: As directed    Call MD for:  persistant dizziness or light-headedness   Complete by: As directed    Call MD for:  persistant nausea and vomiting   Complete by: As directed    Call MD for:  redness, tenderness, or signs of infection (pain, swelling, redness, odor or green/yellow discharge around incision site)   Complete by: As directed    Call MD for:  severe uncontrolled pain   Complete by: As directed    Call MD for:  temperature >100.4   Complete by: As directed    Diet - low sodium heart healthy   Complete by: As directed    Discharge instructions   Complete by: As directed    Call  [phone] for a followup appointment. Take a stool softener while you are using pain medications.   Driving Restrictions   Complete by: As directed    Do not drive for 2 weeks.   Increase activity slowly   Complete by: As directed    Lifting restrictions   Complete by: As directed    Do not lift more than 5 pounds. No excessive bending or twisting.   May shower / Bathe   Complete by: As directed    Remove the dressing for 3 days after surgery.  You may shower, but leave the incision alone.   Remove dressing in 48 hours   Complete by: As directed       Allergies as of 04/04/2024       Reactions   Levofloxacin Hives, Other (See Comments), Shortness Of Breath, Swelling   Tolerates Ciprofloxacin  RASH IN MOUTH   (can take IV route)   Bactrim  [sulfamethoxazole -trimethoprim ] Hives, Itching   Clarithromycin     GI upset Other reaction(s): GI Upset (intolerance) GI upset -can take but does cause mild upset    Lyrica [pregabalin]    Nexletol [bempedoic Acid]    Muscle pain   Statins Other (See Comments)   Myalgias   Zetia  [ezetimibe ]    Muscle pain        Medication List     STOP taking these medications    oxyCODONE -acetaminophen  10-325 MG tablet Commonly known as: PERCOCET  TAKE these medications    acyclovir  ointment 5 % Commonly known as: ZOVIRAX  Apply 1 Application topically every 4 (four) hours as needed.   albuterol  108 (90 Base) MCG/ACT inhaler Commonly known as: VENTOLIN  HFA Inhale 2 puffs into the lungs every 6 (six) hours as needed for wheezing or shortness of breath.   ALPRAZolam  0.25 MG tablet Commonly known as: XANAX  Take 1 tablet (0.25 mg total) by mouth at bedtime as needed for anxiety.   amLODipine  5 MG tablet Commonly known as: NORVASC  Take 1 tablet (5 mg total) by mouth daily.   aspirin EC 81 MG tablet Take 81 mg by mouth daily. Swallow whole.   buPROPion  300 MG 24 hr tablet Commonly known as: Wellbutrin  XL Take 1 tablet (300 mg  total) by mouth daily.   clobetasol 0.05 % external solution Commonly known as: TEMOVATE Apply 1 Application topically 2 (two) times daily.   Clobetasol Propionate 0.05 % shampoo Apply to dry scalp - let sit 10 min and then add water, later and rinse --- 2-3 x weekly.   clotrimazole  10 MG troche Commonly known as: MYCELEX  Take 1 tablet (10 mg total) by mouth 5 (five) times daily.   D3 PO Take 2 tablets by mouth daily.   diclofenac  1.3 % Ptch Commonly known as: Flector  Place 1 patch onto the skin 2 (two) times daily.   dicyclomine  20 MG tablet Commonly known as: BENTYL  Take 1 tablet (20 mg total) by mouth 2 (two) times daily as needed for up to 15 days for spasms.   docusate sodium  100 MG capsule Commonly known as: COLACE Take 1 capsule (100 mg total) by mouth 2 (two) times daily.   DULoxetine  60 MG capsule Commonly known as: Cymbalta  Take 1 capsule (60 mg total) by mouth 2 (two) times daily.   furosemide  20 MG tablet Commonly known as: LASIX  Take 1 tablet (20 mg total) by mouth daily as needed for edema.   HYDROcodone -acetaminophen  5-325 MG tablet Commonly known as: NORCO/VICODIN Take 1-2 tablets by mouth every 4 (four) hours as needed for moderate pain (pain score 4-6).   hydrocortisone  2.5 % ointment Apply to affected areas on face until symptoms improve.   hydrOXYzine  10 MG tablet Commonly known as: ATARAX  Take 10 mg by mouth daily as needed for itching.   levothyroxine  75 MCG tablet Commonly known as: SYNTHROID  TAKE 1 TABLET(75 MCG) BY MOUTH DAILY   lidocaine -prilocaine  cream Commonly known as: EMLA  Apply 1 Application topically 4 (four) times daily as needed (pain).   lisinopril  2.5 MG tablet Commonly known as: ZESTRIL  Take 2.5 mg by mouth daily.   metoprolol  tartrate 50 MG tablet Commonly known as: LOPRESSOR  TAKE 1 TABLET(50 MG) BY MOUTH TWICE DAILY WITH MEALS   montelukast  10 MG tablet Commonly known as: SINGULAIR  Take 1 tablet (10 mg total) by  mouth daily as needed (allergies). What changed: when to take this   Mounjaro  12.5 MG/0.5ML Pen Generic drug: tirzepatide  ADMINISTER 12.5 MG UNDER THE SKIN 1 TIME A WEEK FOR 16 DOSES   mupirocin ointment 2 % Commonly known as: BACTROBAN Apply 1 Application topically 2 (two) times daily.   naloxone 4 MG/0.1ML Liqd nasal spray kit Commonly known as: NARCAN Place 1 spray into the nose once. In case of opioid overdose.   nitroGLYCERIN  0.4 MG SL tablet Commonly known as: NITROSTAT  DISSOLVE ONE TABLET UNDER TONGUE AS NEEDED FOR CHEST PAIN EVERY 5 MINUTES FOR UP TO 3 DOSES   pantoprazole  40 MG tablet Commonly known as:  PROTONIX  Take 1 tablet (40 mg total) by mouth daily.   Repatha SureClick 140 MG/ML Soaj Generic drug: Evolocumab Inject 140 mg into the skin every 14 (fourteen) days.   tiZANidine  4 MG tablet Commonly known as: ZANAFLEX  Take 4 mg by mouth at bedtime as needed for muscle spasms.   Trelegy Ellipta  100-62.5-25 MCG/ACT Aepb Generic drug: Fluticasone -Umeclidin-Vilant Inhale 1 puff into the lungs daily.   tretinoin  0.025 % gel Commonly known as: RETIN-A  Apply 1 Application topically at bedtime.   valACYclovir  1000 MG tablet Commonly known as: VALTREX  Take one table daily   vitamin C 1000 MG tablet Take 1,000 mg by mouth daily.         Signed: Reyes JONETTA Budge 04/04/2024, 8:07 AM

## 2024-04-04 NOTE — TOC Transition Note (Addendum)
 Transition of Care (TOC) - Discharge Note Rayfield Gobble RN, BSN Inpatient Care Management Unit 4E- RN Case Manager See Treatment Team for direct phone # Cross coverage for 6N  Patient Details  Name: Courtney [name] MRN: [id number] Date of Birth: Aug 11, 1945  Transition of Care [facility]) CM/SW Contact:  Gobble Rayfield Hurst, RN Phone Number: 04/04/2024, 11:04 AM   Clinical Narrative:    Pt stable for transition home today, notified by PT that recommendations for [facility] follow up.  Call made to pt at bedside- per pt she is already active with [facility] for HHRN/PT- she voiced she really only wants PT- does not need RN any more.   Pt states she has needed DME- no new DME needs at this time.   Has transportation home.   Call made to [facility] liaison to confirm services and notify for resumption of HHPT. 1200- [facility] liaison returned call- confirmed pt active w/ them for PT/OT- ordered in Sept. - they will resume services as previously ordered.     Final next level of care: Home w Home Health Services Barriers to Discharge: No Barriers Identified   Patient Goals and CMS Choice Patient states their goals for this hospitalization and ongoing recovery are:: return home with home health   Choice offered to / list presented to : Patient      Discharge Placement               Home w/ [facility]        Discharge Plan and Services Additional resources added to the After Visit Summary for     Discharge Planning Services: CM Consult Post Acute Care Choice: Home Health, Resumption of Svcs/PTA Provider          DME Arranged: N/A DME Agency: NA       HH Arranged: PT HH Agency: [facility] Date HH Agency Contacted: 04/04/24 Time HH Agency Contacted: 1014 Representative spoke with at [facility] Agency: Amy  Social Drivers of Health (SDOH) Interventions SDOH Screenings   Food Insecurity: No Food Insecurity (04/03/2024)  Housing: Low Risk  (04/03/2024)  Transportation Needs: No  Transportation Needs (04/03/2024)  Utilities: Not At Risk (04/03/2024)  Alcohol Screen: Low Risk  (12/12/2022)  Depression (PHQ2-9): High Risk (02/13/2024)  Financial Resource Strain: Low Risk  (12/12/2022)  Physical Activity: Inactive (12/12/2022)  Social Connections: Moderately Isolated (04/03/2024)  Stress: No Stress Concern Present (12/12/2022)  Tobacco Use: Low Risk  (04/03/2024)  Health Literacy: Adequate Health Literacy (02/15/2023)     Readmission Risk Interventions     No data to display

## 2024-04-05 ENCOUNTER — Other Ambulatory Visit: Payer: Self-pay | Admitting: Family Medicine

## 2024-04-05 ENCOUNTER — Telehealth: Payer: Self-pay

## 2024-04-05 MED ORDER — HYDROXYZINE HCL 10 MG PO TABS: 10.0000 mg | ORAL_TABLET | Freq: Every day | ORAL | 2 refills | Status: DC | PRN

## 2024-04-05 NOTE — Telephone Encounter (Signed)
 Gave verbal ok to LuAnne.

## 2024-04-05 NOTE — Telephone Encounter (Signed)
 Copied from CRM [id number]. Topic: Clinical - Medication Refill >> Apr 05, 2024  8:35 AM Mia F wrote: Medication: hydrOXYzine  (ATARAX ) 10 MG tablet   Has the patient contacted their pharmacy? No (Agent: If no, request that the patient contact the pharmacy for the refill. If patient does not wish to contact the pharmacy document the reason why and proceed with request.) (Agent: If yes, when and what did the pharmacy advise?)  This is the patient's preferred pharmacy:  [id number] [place], [place] - [address] AT [facility] [address] [place] [address] Phone: [phone] Fax: [phone]  Is this the correct pharmacy for this prescription? Yes If no, delete pharmacy and type the correct one.   Has the prescription been filled recently? Yes  Is the patient out of the medication? Yes  Has the patient been seen for an appointment in the last year OR does the patient have an upcoming appointment? Yes  Can we respond through MyChart? Yes  Agent: Please be advised that Rx refills may take up to 3 business days. We ask that you follow-up with your pharmacy.

## 2024-04-05 NOTE — Telephone Encounter (Signed)
 Copied from CRM #[id number]. Topic: Clinical - Home Health Verbal Orders >> Apr 05, 2024 10:56 AM Delon HERO wrote: LuAnne Calling from [facility] is calling to request if Dr. Sherre  is ok with resuming services on Monday for PT.   A  THORACIC SIX LAMINECTOMY FOR SPINAL CORD STIMULATOR was inserted in her lower back area.   CB- [phone] Verbal VM

## 2024-04-08 NOTE — Telephone Encounter (Signed)
 LuAnne Calling from [facility] is calling to report that the patient requested to resume services next week. Is this ok? CB- [phone] Verbal Ok VM

## 2024-04-09 NOTE — Progress Notes (Signed)
 Subjective:  Patient ID: Courtney [name], female    DOB: 1945/11/17  Age: 78 y.o. MRN: [id number]  Chief Complaint  Patient presents with   Hospitalization Follow-up    HPI: Discussed the use of AI scribe software for clinical note transcription with the patient, who gave verbal consent to proceed.  History of Present Illness Courtney [name] is a 78 year old female who presents for follow-up after spinal cord stimulator surgery. She is accompanied by her daughter.  Postoperative status following spinal cord stimulator placement - Underwent [employer] spinal cord stimulator implantation on October 15th - Hospitalized for one night postoperatively - Device has two incisions: one at the top for leads, one at the bottom for the device - Pain relief achieved with the device - Learning to use three different device programs - Describes device sensation as 'weird' but is adjusting - Itching present at incision sites - Cautious with clothing due to device location near bra line  Hypertension and antihypertensive medication side effects - Currently taking low dose lisinopril  2.5 mg daily, metoprolol  tartrate 25 mg twice daily, and amlodipine  5 mg daily for blood pressure management - Experiencing dizziness, attributed to antihypertensive medication, particularly amlodipine   SVT management - Taking metoprolol  tartrate 50 mg twice daily for atrial fibrillation - Does not wish to discontinue metoprolol  - No chest pain or shortness of breath  Peripheral edema - Leg swelling present - mild. Has lasix  20 mg daily as needed.  Bowel function - Regular bowel movements  Urinary tract infections - History of urinary tract infections  Herpes zoster management and renal function - Currently taking Valtrex  for shingles follow-up - Concerned about Valtrex  impact on kidney function - Renal function checked on October 13th with result of 51, improved from previous levels  Hyperlipidemia  management - Receiving Repatha every fourteen days for cholesterol management - Upcoming appointment on November 5th, likely for blood work related to cholesterol and kidney function       02/13/2024    1:47 PM 11/22/2023    4:16 PM 10/18/2023   11:41 AM 09/28/2023   11:01 AM 07/14/2023   10:11 AM  Depression screen PHQ 2/9  Decreased Interest 3 3 3  0 0  Down, Depressed, Hopeless 2 3 2  0 0  PHQ - 2 Score 5 6 5  0 0  Altered sleeping 3 2 3     Tired, decreased energy 3 3 3     Change in appetite 3 3 0    Feeling bad or failure about yourself  1 3 1     Trouble concentrating 3 2 1     Moving slowly or fidgety/restless 2 2 0    Suicidal thoughts 0 0 0    PHQ-9 Score 20 21 13     Difficult doing work/chores Somewhat difficult Very difficult Somewhat difficult          03/07/2024   11:27 AM  Fall Risk   Falls in the past year? 1  Number falls in past yr: 1  Injury with Fall? 1  Risk for fall due to : History of fall(s)  Follow up Falls evaluation completed    Patient Care Team: Sherre Clapper, MD as PCP - General (Internal Medicine) Dann Candyce RAMAN, MD as PCP - Cardiology (Cardiology) McGukin, Lynwood FABIENE Raddle., MD (Cardiology) [name], [name], MD as Consulting Physician (Pulmonary Disease) Nyle Rankin POUR, [facility] (Inactive) (Pharmacist)   Review of Systems  Constitutional:  Negative for chills, fatigue and fever.  HENT:  Negative for  congestion, ear pain and sore throat.   Respiratory:  Negative for cough and shortness of breath.   Cardiovascular:  Negative for chest pain.  Gastrointestinal:  Negative for abdominal pain, constipation, diarrhea, nausea and vomiting.  Genitourinary:  Negative for dysuria and urgency.  Musculoskeletal:  Negative for arthralgias and myalgias.  Skin:  Negative for rash.  Neurological:  Positive for dizziness. Negative for headaches.  Psychiatric/Behavioral:  Negative for dysphoric mood. The patient is not nervous/anxious.     Current Outpatient  Medications on File Prior to Visit  Medication Sig Dispense Refill   acyclovir  ointment (ZOVIRAX ) 5 % Apply 1 Application topically every 4 (four) hours as needed. 30 g 0   albuterol  (VENTOLIN  HFA) 108 (90 Base) MCG/ACT inhaler Inhale 2 puffs into the lungs every 6 (six) hours as needed for wheezing or shortness of breath. 8 g 2   ALPRAZolam  (XANAX ) 0.25 MG tablet Take 1 tablet (0.25 mg total) by mouth at bedtime as needed for anxiety. 10 tablet 0   amLODipine  (NORVASC ) 5 MG tablet Take 1 tablet (5 mg total) by mouth daily. 90 tablet 0   Ascorbic Acid (VITAMIN C) 1000 MG tablet Take 1,000 mg by mouth daily.     aspirin EC 81 MG tablet Take 81 mg by mouth daily. Swallow whole.     buPROPion  (WELLBUTRIN  XL) 300 MG 24 hr tablet Take 1 tablet (300 mg total) by mouth daily. 90 tablet 0   Cholecalciferol (D3 PO) Take 2 tablets by mouth daily.     clobetasol (TEMOVATE) 0.05 % external solution Apply 1 Application topically 2 (two) times daily. 50 mL 0   Clobetasol Propionate 0.05 % shampoo Apply to dry scalp - let sit 10 min and then add water, later and rinse --- 2-3 x weekly. 118 mL 2   dicyclomine  (BENTYL ) 20 MG tablet Take 1 tablet (20 mg total) by mouth 2 (two) times daily as needed for up to 15 days for spasms. 30 tablet 0   docusate sodium  (COLACE) 100 MG capsule Take 1 capsule (100 mg total) by mouth 2 (two) times daily. 30 capsule 0   DULoxetine  (CYMBALTA ) 60 MG capsule Take 1 capsule (60 mg total) by mouth 2 (two) times daily. 180 capsule 0   Fluticasone -Umeclidin-Vilant (TRELEGY ELLIPTA ) 100-62.5-25 MCG/ACT AEPB Inhale 1 puff into the lungs daily. 60 each 11   furosemide  (LASIX ) 20 MG tablet Take 1 tablet (20 mg total) by mouth daily as needed for edema. 90 tablet 2   HYDROcodone -acetaminophen  (NORCO/VICODIN) 5-325 MG tablet Take 1-2 tablets by mouth every 4 (four) hours as needed for moderate pain (pain score 4-6). 30 tablet 0   hydrocortisone  2.5 % ointment Apply to affected areas on face  until symptoms improve. 30 g 1   hydrOXYzine  (ATARAX ) 10 MG tablet Take 1 tablet (10 mg total) by mouth daily as needed for itching. 90 tablet 2   levothyroxine  (SYNTHROID ) 75 MCG tablet TAKE 1 TABLET(75 MCG) BY MOUTH DAILY 90 tablet 1   lisinopril  (ZESTRIL ) 2.5 MG tablet Take 2.5 mg by mouth daily.     metoprolol  tartrate (LOPRESSOR ) 50 MG tablet TAKE 1 TABLET(50 MG) BY MOUTH TWICE DAILY WITH MEALS 180 tablet 1   montelukast  (SINGULAIR ) 10 MG tablet Take 1 tablet (10 mg total) by mouth daily as needed (allergies). (Patient taking differently: Take 10 mg by mouth at bedtime.) 90 tablet 2   MOUNJARO  12.5 MG/0.5ML Pen ADMINISTER 12.5 MG UNDER THE SKIN 1 TIME A WEEK FOR 16 DOSES  2 mL 3   mupirocin ointment (BACTROBAN) 2 % Apply 1 Application topically 2 (two) times daily. 22 g 1   naloxone (NARCAN) nasal spray 4 mg/0.1 mL Place 1 spray into the nose once. In case of opioid overdose.     nitroGLYCERIN  (NITROSTAT ) 0.4 MG SL tablet DISSOLVE ONE TABLET UNDER TONGUE AS NEEDED FOR CHEST PAIN EVERY 5 MINUTES FOR UP TO 3 DOSES 25 tablet 1   pantoprazole  (PROTONIX ) 40 MG tablet Take 1 tablet (40 mg total) by mouth daily. 30 tablet 3   REPATHA SURECLICK 140 MG/ML SOAJ Inject 140 mg into the skin every 14 (fourteen) days.     tiZANidine  (ZANAFLEX ) 4 MG tablet Take 4 mg by mouth at bedtime as needed for muscle spasms.     tretinoin  (RETIN-A ) 0.025 % gel Apply 1 Application topically at bedtime. (Patient not taking: Reported on 03/29/2024) 45 g 0   valACYclovir  (VALTREX ) 1000 MG tablet Take one table daily 90 tablet 1   No current facility-administered medications on file prior to visit.   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   Followed in Pulmonary clinic/ [facility]/ [place]    Cardiac dysrhythmia    Chronic idiopathic constipation     Colitis, acute 02/08/2021   Coronary artery disease    Depression, major, recurrent, moderate (HCC)    Fibromyalgia    Headache    Heart murmur    History of bladder infections    History of COVID-19 2020   Hyperlipidemia    Hypertension    Hypothyroidism    Knee pain, bilateral    Melena 02/08/2021   Osteoarthritis    Pneumonia    x several   Pre-diabetes 03/2021   Primary insomnia    Recovering alcoholic (HCC)    SVT (supraventricular tachycardia)    UTI (urinary tract infection)    Varicose veins    Vitamin B12 deficiency    Past Surgical History:  Procedure Laterality Date   ABDOMINAL HYSTERECTOMY  1991   APPENDECTOMY     BACK SURGERY     had 4 surgeries. Have rods placed in back - last 07/2021   BREAST BIOPSY Left    x2   BREAST ENHANCEMENT SURGERY  2006   CARDIAC CATHETERIZATION  12/22/2020   stents placed   CHOLECYSTECTOMY  12/2019   removed lap band.    COLONOSCOPY  12/01/2016   Moderate predominantly sigmod diverticulosis. Otherwise grossly normal colonoscopy   EYE SURGERY     IOL- bilateral - [place]   KNEE ARTHROSCOPY Left    lap band surgery  1981   LUMBAR FUSION  07/29/2015   posterior level one   removal of cervical disc fragments  10/2007   pt. denies    THORACIC LAMINECTOMY FOR SPINAL CORD STIMULATOR N/A 04/03/2024   Procedure: THORACIC SIX LAMINECTOMY FOR SPINAL CORD STIMULATOR;  Surgeon: Mavis Purchase, MD;  Location: [facility] OR;  Service: Neurosurgery;  Laterality: N/A;  SCS PER WITH PADDLE LEAD, T6   TUBAL LIGATION  1970    Family History  Problem Relation Age of Onset   Heart disease Mother    Heart disease Father    Diabetes Sister    Other Sister        BRAIN TUMOR   Heart disease  Brother    Heart disease Brother    Heart disease Brother    Heart disease Brother    Heart disease Brother    Colon cancer Maternal Aunt    Colon cancer Cousin    Social History   Socioeconomic History   Marital status: Widowed    Spouse name:  Not on file   Number of children: 2   Years of education: Not on file   Highest education level: Not on file  Occupational History   Occupation: retired    Comment: nurse  Tobacco Use   Smoking status: Never   Smokeless tobacco: Never  Vaping Use   Vaping status: Never Used  Substance and Sexual Activity   Alcohol use: No    Alcohol/week: 0.0 standard drinks of alcohol    Comment: recovery x 20 yrs   Drug use: No   Sexual activity: Not Currently    Birth control/protection: None, Surgical    Comment: hysterectomy  Other Topics Concern   Not on file  Social History Narrative   Not on file   Social Drivers of Health   Financial Resource Strain: Low Risk  (12/12/2022)   Overall Financial Resource Strain (CARDIA)    Difficulty of Paying Living Expenses: Not hard at all  Food Insecurity: No Food Insecurity (04/03/2024)   Hunger Vital Sign    Worried About Running Out of Food in the Last Year: Never true    Ran Out of Food in the Last Year: Never true  Transportation Needs: No Transportation Needs (04/03/2024)   PRAPARE - Administrator, Civil Service (Medical): No    Lack of Transportation (Non-Medical): No  Physical Activity: Inactive (12/12/2022)   Exercise Vital Sign    Days of Exercise per Week: 0 days    Minutes of Exercise per Session: 0 min  Stress: No Stress Concern Present (12/12/2022)   [employer] of Occupational Health - Occupational Stress Questionnaire    Feeling of Stress : Not at all  Social Connections: Moderately Isolated (04/03/2024)   Social Connection and Isolation Panel    Frequency of Communication with Friends and Family: Three times a week    Frequency of Social Gatherings with Friends and Family: Three times a week    Attends Religious Services: More than 4 times per year    Active Member of Clubs or Organizations: No    Attends Banker Meetings: Never    Marital Status: Widowed    Objective:  BP 112/74   Pulse  96   Temp 98.1 F (36.7 C)   Ht 5' 5 (1.651 m)   Wt 154 lb (69.9 kg)   SpO2 100%   BMI 25.63 kg/m      04/10/2024   11:17 AM 04/04/2024   10:06 AM 04/04/2024    5:40 AM  BP/Weight  Systolic BP 112 135 141  Diastolic BP 74 66 67  Wt. (Lbs) 154    BMI 25.63 kg/m2      Physical Exam Vitals reviewed.  Constitutional:      Appearance: Normal appearance. She is normal weight.  Neck:     Vascular: No carotid bruit.  Cardiovascular:     Rate and Rhythm: Normal rate and regular rhythm.     Heart sounds: Normal heart sounds.  Pulmonary:     Effort: Pulmonary effort is normal. No respiratory distress.     Breath sounds: Normal breath sounds.  Abdominal:     General: Abdomen is flat.  Bowel sounds are normal.     Palpations: Abdomen is soft.     Tenderness: There is no abdominal tenderness.  Skin:    Comments: Thoracic incision and upper lumbar incision both off to the left. Healing well. C/D/I. Stimulator under lumbar incision.  Neurological:     Mental Status: She is alert and oriented to person, place, and time.  Psychiatric:        Mood and Affect: Mood normal.        Behavior: Behavior normal.         Lab Results  Component Value Date   WBC 10.6 (H) 04/01/2024   HGB 12.0 04/01/2024   HCT 37.3 04/01/2024   PLT 355 04/01/2024   GLUCOSE 85 04/01/2024   CHOL 215 (H) 01/01/2024   TRIG 119 01/01/2024   HDL 55 01/01/2024   LDLCALC 139 (H) 01/01/2024   ALT 16 01/01/2024   AST 22 01/01/2024   NA 131 (L) 04/01/2024   K 3.9 04/01/2024   CL 95 (L) 04/01/2024   CREATININE 1.11 (H) 04/01/2024   BUN 17 04/01/2024   CO2 29 04/01/2024   TSH 2.580 01/01/2024   INR 1.0 06/10/2023   HGBA1C 5.6 12/12/2022    Results for orders placed or performed during the hospital encounter of 04/01/24  Surgical pcr screen   Collection Time: 04/01/24  1:37 PM   Specimen: Nasal Mucosa; Nasal Swab  Result Value Ref Range   MRSA, PCR NEGATIVE NEGATIVE   Staphylococcus aureus  NEGATIVE NEGATIVE  CBC   Collection Time: 04/01/24  1:37 PM  Result Value Ref Range   WBC 10.6 (H) 4.0 - 10.5 K/uL   RBC 3.73 (L) 3.87 - 5.11 MIL/uL   Hemoglobin 12.0 12.0 - 15.0 g/dL   HCT 62.6 63.9 - 53.9 %   MCV 100.0 80.0 - 100.0 fL   MCH 32.2 26.0 - 34.0 pg   MCHC 32.2 30.0 - 36.0 g/dL   RDW 85.9 88.4 - 84.4 %   Platelets 355 150 - 400 K/uL   nRBC 0.0 0.0 - 0.2 %  Basic metabolic panel   Collection Time: 04/01/24  1:37 PM  Result Value Ref Range   Sodium 131 (L) 135 - 145 mmol/L   Potassium 3.9 3.5 - 5.1 mmol/L   Chloride 95 (L) 98 - 111 mmol/L   CO2 29 22 - 32 mmol/L   Glucose, Bld 85 70 - 99 mg/dL   BUN 17 8 - 23 mg/dL   Creatinine, Ser 8.88 (H) 0.44 - 1.00 mg/dL   Calcium 9.4 8.9 - 89.6 mg/dL   GFR, Estimated 51 (L) >60 mL/min   Anion gap 7 5 - 15  .  Assessment & Plan:   Assessment & Plan Lumbar back pain Post-operative status following spinal cord stimulator placement on October 15th. Device aiding pain management. Incisions healing but itchy. - Apply Vaseline to incisions to alleviate itching.    Hypertensive heart disease with heart failure (HCC) Hypertension managed with lisinopril , amlodipine , and metoprolol . Reports dizziness possibly due to medication. - Discuss blood pressure medication adjustments with cardiologist.    Encounter for immunization  Orders:   Flu vaccine HIGH DOSE PF(Fluzone Trivalent)  Prediabetes Recommend continue to work on eating healthy diet and exercise. The current medical regimen is effective;  continue present plan and medications. Continue mounjaro  12.5 mg weekly.      Mixed hyperlipidemia Not at goal in July. Currently on  repatha Continue to work on eating a healthy diet and  exercise.       Supraventricular tachycardia Rate controlled on metoprolol .      Myalgia due to statin Intolerant to statins    Stage 3b chronic kidney disease (HCC) stable      Body mass index is 25.63 kg/m.    No orders of  the defined types were placed in this encounter.   Orders Placed This Encounter  Procedures   Flu vaccine HIGH DOSE PF(Fluzone Trivalent)       Follow-up: No follow-ups on file.  An After Visit Summary was printed and given to the patient.  Abigail Free, MD [facility] [phone]

## 2024-04-10 ENCOUNTER — Ambulatory Visit (INDEPENDENT_AMBULATORY_CARE_PROVIDER_SITE_OTHER): Admitting: Family Medicine

## 2024-04-10 VITALS — BP 112/74 | HR 96 | Temp 98.1°F | Ht 65.0 in | Wt 154.0 lb

## 2024-04-10 DIAGNOSIS — T466X5A Adverse effect of antihyperlipidemic and antiarteriosclerotic drugs, initial encounter: Secondary | ICD-10-CM | POA: Diagnosis not present

## 2024-04-10 DIAGNOSIS — E782 Mixed hyperlipidemia: Secondary | ICD-10-CM

## 2024-04-10 DIAGNOSIS — M791 Myalgia, unspecified site: Secondary | ICD-10-CM | POA: Diagnosis not present

## 2024-04-10 DIAGNOSIS — I110 Hypertensive heart disease with heart failure: Secondary | ICD-10-CM | POA: Diagnosis not present

## 2024-04-10 DIAGNOSIS — N1832 Chronic kidney disease, stage 3b: Secondary | ICD-10-CM | POA: Diagnosis not present

## 2024-04-10 DIAGNOSIS — I471 Supraventricular tachycardia, unspecified: Secondary | ICD-10-CM

## 2024-04-10 DIAGNOSIS — R7303 Prediabetes: Secondary | ICD-10-CM

## 2024-04-10 DIAGNOSIS — M545 Low back pain, unspecified: Secondary | ICD-10-CM

## 2024-04-10 DIAGNOSIS — Z23 Encounter for immunization: Secondary | ICD-10-CM

## 2024-04-11 DIAGNOSIS — I2510 Atherosclerotic heart disease of native coronary artery without angina pectoris: Secondary | ICD-10-CM | POA: Diagnosis not present

## 2024-04-13 ENCOUNTER — Encounter: Payer: Self-pay | Admitting: Family Medicine

## 2024-04-13 DIAGNOSIS — I110 Hypertensive heart disease with heart failure: Secondary | ICD-10-CM | POA: Insufficient documentation

## 2024-04-13 NOTE — Assessment & Plan Note (Signed)
Rate controlled on metoprolol.  °

## 2024-04-13 NOTE — Assessment & Plan Note (Signed)
 Intolerant to statins.

## 2024-04-13 NOTE — Assessment & Plan Note (Signed)
 Post-operative status following spinal cord stimulator placement on October 15th. Device aiding pain management. Incisions healing but itchy. - Apply Vaseline to incisions to alleviate itching.

## 2024-04-13 NOTE — Assessment & Plan Note (Signed)
 Hypertension managed with lisinopril , amlodipine , and metoprolol . Reports dizziness possibly due to medication. - Discuss blood pressure medication adjustments with cardiologist.

## 2024-04-13 NOTE — Assessment & Plan Note (Signed)
 Recommend continue to work on eating healthy diet and exercise. The current medical regimen is effective;  continue present plan and medications. Continue mounjaro  12.5 mg weekly.

## 2024-04-13 NOTE — Assessment & Plan Note (Signed)
 Not at goal in July. Currently on  repatha Continue to work on eating a healthy diet and exercise.

## 2024-04-13 NOTE — Assessment & Plan Note (Signed)
 stable

## 2024-04-17 ENCOUNTER — Telehealth: Payer: Self-pay

## 2024-04-17 NOTE — Telephone Encounter (Signed)
 Copied from CRM [id number]. Topic: General - Other >> Apr 16, 2024  4:27 PM Jasmin G wrote: Reason for CRM: Ms. Kate from [facility] called to let Dr. Stacy team know that pt will resume PT on Thursday 30th as she states that pt had an appt today.

## 2024-04-18 ENCOUNTER — Ambulatory Visit: Payer: Self-pay

## 2024-04-18 ENCOUNTER — Other Ambulatory Visit: Payer: Self-pay | Admitting: Family Medicine

## 2024-04-18 MED ORDER — NITROFURANTOIN MONOHYD MACRO 100 MG PO CAPS: 100.0000 mg | ORAL_CAPSULE | Freq: Two times a day (BID) | ORAL | 0 refills | Status: DC

## 2024-04-18 NOTE — Telephone Encounter (Signed)
 FYI Only or Action Required?: Action required by provider: clinical question for provider, update on patient condition, and patient requesting antibiotic for UTI.  Patient was last seen in primary care on 04/10/2024 by Sherre Clapper, MD.  Called Nurse Triage reporting Urinary Frequency.  Symptoms began 2 days ago.  Interventions attempted: OTC medications: AZO and Rest, hydration, or home remedies.  Symptoms are: gradually worsening.  Triage Disposition: See Physician Within 24 Hours  Patient/caregiver understands and will follow disposition?: No, wishes to speak with PCP               Copied from CRM #[id number]. Topic: Clinical - Medication Question >> Apr 18, 2024  1:08 PM Courtney [name] wrote: Reason for CRM: Pain on both sides of back, burning when urinating. Pt believes she has a UTI and is requesting medication Reason for Disposition  Urinating more frequently than usual (i.e., frequency) OR new-onset of the feeling of an urgent need to urinate (i.e., urgency)  Answer Assessment - Initial Assessment Questions Patient states that she had a back stimulator put in two weeks ago and she isnt supposed to be driving yet so she cannot come in for an appointment Urinary Frequency and Burning with urination x 2 days Takes AZO Patient is advised that typically an office visit is recommended for urinary symptoms and requests for antibiotics but patient states that she has had UTIs in the past and has no other symptoms along with this and Dr Sherre is familiar with her medical history Patient states that her daughter will be able to go pick up a prescription for her today if it can be called in to her pharmacy  Patient is advised to call us  back if anything changes or with any further questions/concerns. Patient is advised that if anything worsens to go to the Emergency Room. Patient verbalized understanding.    1. SYMPTOM: What's the main symptom you're concerned about? (e.g.,  frequency, incontinence)     Frequency, burning 2. ONSET: When did the  frequency  start?     Two days ago 3. PAIN: Is there any pain? If Yes, ask: How bad is it? (Scale: 1-10; mild, moderate, severe)     8 4. CAUSE: What do you think is causing the symptoms?     UTI 5. OTHER SYMPTOMS: Do you have any other symptoms? (e.g., blood in urine, fever, flank pain, pain with urination)     denies  Protocols used: Urinary Symptoms-A-AH

## 2024-04-18 NOTE — Telephone Encounter (Signed)
 My chart message sent

## 2024-04-24 ENCOUNTER — Ambulatory Visit: Admitting: Family Medicine

## 2024-04-24 VITALS — BP 116/62 | HR 104 | Temp 98.2°F | Ht 65.0 in | Wt 151.0 lb

## 2024-04-24 DIAGNOSIS — N1832 Chronic kidney disease, stage 3b: Secondary | ICD-10-CM

## 2024-04-24 DIAGNOSIS — E782 Mixed hyperlipidemia: Secondary | ICD-10-CM

## 2024-04-24 DIAGNOSIS — R7303 Prediabetes: Secondary | ICD-10-CM | POA: Diagnosis not present

## 2024-04-24 DIAGNOSIS — R195 Other fecal abnormalities: Secondary | ICD-10-CM

## 2024-04-24 DIAGNOSIS — N3000 Acute cystitis without hematuria: Secondary | ICD-10-CM | POA: Diagnosis not present

## 2024-04-24 LAB — POCT GLYCOSYLATED HEMOGLOBIN (HGB A1C): HbA1c POC (<> result, manual entry): 5.3 % (ref 4.0–5.6)

## 2024-04-24 LAB — POCT URINALYSIS DIP (CLINITEK)
Bilirubin, UA: NEGATIVE
Blood, UA: NEGATIVE
Glucose, UA: NEGATIVE mg/dL
Ketones, POC UA: NEGATIVE mg/dL
Nitrite, UA: NEGATIVE
POC PROTEIN,UA: NEGATIVE
Spec Grav, UA: 1.015 (ref 1.010–1.025)
Urobilinogen, UA: 0.2 U/dL
pH, UA: 6 (ref 5.0–8.0)

## 2024-04-24 LAB — POCT LIPID PANEL
HDL: 44
LDL: 205
Non-HDL: 256
TC: 300
TRG: 253

## 2024-04-24 MED ORDER — HYDROXYZINE HCL 10 MG PO TABS: 10.0000 mg | ORAL_TABLET | Freq: Every day | ORAL | 2 refills | Status: DC | PRN

## 2024-04-24 MED ORDER — CIPROFLOXACIN HCL 250 MG PO TABS: 250.0000 mg | ORAL_TABLET | Freq: Two times a day (BID) | ORAL | 0 refills | Status: AC

## 2024-04-24 NOTE — Progress Notes (Signed)
 Subjective:  Patient ID: Courtney [name], female    DOB: [birth year]  Age: 78 y.o. MRN: [id number]  Chief Complaint  Patient presents with   Medical Management of Chronic Issues    HPI: Discussed the use of AI scribe software for clinical note transcription with the patient, who gave verbal consent to proceed.  History of Present Illness Courtney [name] is a 78 year old female who presents with a bladder infection.  Urinary tract symptoms - Bladder infection symptoms present, described as feeling 'lousy' - Absence of typical dysuria experienced in prior infections - No fever, chills, sweats, earaches, sore throat, stuffy nose, chest pain, or breathing problems - Previous urine culture sensitive to ciprofloxacin , which is well tolerated and typically resolves symptoms  Renal function - History of decreased kidney function, previously as low as 30 - Most recent kidney function test on October 13th showed improvement to 17 - Concern regarding kidney health due to prior infection entering bloodstream  Hyperlipidemia - Currently taking Repatha for high cholesterol - Recent cholesterol panel: LDL 205, triglycerides 253, total cholesterol 300 - Repatha was discontinued for several weeks due to surgery, now resumed - Unable to tolerate other statin medications  Constipation - Constipation attributed to pain medication use - Recent recalibration of pain management device at [facility] with goal to reduce pain medication reliance  Glycemic control - Recent A1c measured at 5.4 - A1c improvement attributed to Mounjaro  use, which was discontinued for approximately one and a half weeks  Thyroid  function - On thyroid  medication - Thyroid  levels checked in July were within normal limits  Colorectal cancer screening - Positive Cologuard test on August 20th, 2024 - Sample remained in car for three days prior to testing - Concern regarding possibility of colon cancer       02/13/2024    1:47  PM 11/22/2023    4:16 PM 10/18/2023   11:41 AM 09/28/2023   11:01 AM 07/14/2023   10:11 AM  Depression screen PHQ 2/9  Decreased Interest 3 3 3  0 0  Down, Depressed, Hopeless 2 3 2  0 0  PHQ - 2 Score 5 6 5  0 0  Altered sleeping 3 2 3     Tired, decreased energy 3 3 3     Change in appetite 3 3 0    Feeling bad or failure about yourself  1 3 1     Trouble concentrating 3 2 1     Moving slowly or fidgety/restless 2 2 0    Suicidal thoughts 0 0 0    PHQ-9 Score 20  21  13      Difficult doing work/chores Somewhat difficult Very difficult Somewhat difficult       Data saved with a previous flowsheet row definition        03/07/2024   11:27 AM  Fall Risk   Falls in the past year? 1  Number falls in past yr: 1  Injury with Fall? 1  Risk for fall due to : History of fall(s)  Follow up Falls evaluation completed    Patient Care Team: Sherre Clapper, MD as PCP - General (Internal Medicine) Dann Candyce RAMAN, MD as PCP - Cardiology (Cardiology) McGukin, Lynwood FABIENE Raddle., MD (Cardiology) [name], [name], MD as Consulting Physician (Pulmonary Disease) Nyle Rankin POUR, [facility] (Inactive) (Pharmacist)   Review of Systems  All other systems reviewed and are negative.   Current Outpatient Medications on File Prior to Visit  Medication Sig Dispense Refill   acyclovir  ointment (ZOVIRAX ) 5 %  Apply 1 Application topically every 4 (four) hours as needed. 30 g 0   albuterol  (VENTOLIN  HFA) 108 (90 Base) MCG/ACT inhaler Inhale 2 puffs into the lungs every 6 (six) hours as needed for wheezing or shortness of breath. 8 g 2   ALPRAZolam  (XANAX ) 0.25 MG tablet Take 1 tablet (0.25 mg total) by mouth at bedtime as needed for anxiety. 10 tablet 0   Ascorbic Acid (VITAMIN C) 1000 MG tablet Take 1,000 mg by mouth daily.     aspirin EC 81 MG tablet Take 81 mg by mouth daily. Swallow whole.     buPROPion  (WELLBUTRIN  XL) 300 MG 24 hr tablet Take 1 tablet (300 mg total) by mouth daily. 90 tablet 0   Cholecalciferol  (D3 PO) Take 2 tablets by mouth daily.     clobetasol (TEMOVATE) 0.05 % external solution Apply 1 Application topically 2 (two) times daily. 50 mL 0   Clobetasol Propionate 0.05 % shampoo Apply to dry scalp - let sit 10 min and then add water, later and rinse --- 2-3 x weekly. 118 mL 2   dicyclomine  (BENTYL ) 20 MG tablet Take 1 tablet (20 mg total) by mouth 2 (two) times daily as needed for up to 15 days for spasms. 30 tablet 0   docusate sodium  (COLACE) 100 MG capsule Take 1 capsule (100 mg total) by mouth 2 (two) times daily. 30 capsule 0   DULoxetine  (CYMBALTA ) 60 MG capsule Take 1 capsule (60 mg total) by mouth 2 (two) times daily. 180 capsule 0   Fluticasone -Umeclidin-Vilant (TRELEGY ELLIPTA ) 100-62.5-25 MCG/ACT AEPB Inhale 1 puff into the lungs daily. 60 each 11   furosemide  (LASIX ) 20 MG tablet Take 1 tablet (20 mg total) by mouth daily as needed for edema. 90 tablet 2   HYDROcodone -acetaminophen  (NORCO/VICODIN) 5-325 MG tablet Take 1-2 tablets by mouth every 4 (four) hours as needed for moderate pain (pain score 4-6). 30 tablet 0   hydrocortisone  2.5 % ointment Apply to affected areas on face until symptoms improve. 30 g 1   levothyroxine  (SYNTHROID ) 75 MCG tablet TAKE 1 TABLET(75 MCG) BY MOUTH DAILY 90 tablet 1   lisinopril  (ZESTRIL ) 2.5 MG tablet Take 2.5 mg by mouth daily.     metoprolol  tartrate (LOPRESSOR ) 50 MG tablet TAKE 1 TABLET(50 MG) BY MOUTH TWICE DAILY WITH MEALS 180 tablet 1   montelukast  (SINGULAIR ) 10 MG tablet Take 1 tablet (10 mg total) by mouth daily as needed (allergies). (Patient taking differently: Take 10 mg by mouth at bedtime.) 90 tablet 2   MOUNJARO  12.5 MG/0.5ML Pen ADMINISTER 12.5 MG UNDER THE SKIN 1 TIME A WEEK FOR 16 DOSES 2 mL 3   mupirocin ointment (BACTROBAN) 2 % Apply 1 Application topically 2 (two) times daily. 22 g 1   naloxone (NARCAN) nasal spray 4 mg/0.1 mL Place 1 spray into the nose once. In case of opioid overdose.     nitroGLYCERIN  (NITROSTAT ) 0.4  MG SL tablet DISSOLVE ONE TABLET UNDER TONGUE AS NEEDED FOR CHEST PAIN EVERY 5 MINUTES FOR UP TO 3 DOSES 25 tablet 1   pantoprazole  (PROTONIX ) 40 MG tablet Take 1 tablet (40 mg total) by mouth daily. 30 tablet 3   REPATHA SURECLICK 140 MG/ML SOAJ Inject 140 mg into the skin every 14 (fourteen) days.     tiZANidine  (ZANAFLEX ) 4 MG tablet Take 4 mg by mouth at bedtime as needed for muscle spasms.     tretinoin  (RETIN-A ) 0.025 % gel Apply 1 Application topically at bedtime. (Patient  not taking: Reported on 03/29/2024) 45 g 0   valACYclovir  (VALTREX ) 1000 MG tablet Take one table daily 90 tablet 1   No current facility-administered medications on file prior to visit.   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   Followed in Pulmonary clinic/ [facility]/ [place]    Cardiac dysrhythmia    Chronic idiopathic constipation    Colitis, acute 02/08/2021   Coronary artery disease    Depression, major, recurrent, moderate (HCC)    Fibromyalgia    Headache    Heart murmur    History of bladder infections    History of COVID-19 2020   Hyperlipidemia    Hypertension    Hypothyroidism    Knee pain, bilateral    Melena 02/08/2021   Osteoarthritis    Pneumonia    x several   Pre-diabetes 03/2021   Primary insomnia    Recovering alcoholic (HCC)    SVT (supraventricular tachycardia)    UTI (urinary tract infection)    Varicose veins    Vitamin B12 deficiency    Past Surgical History:  Procedure Laterality Date   ABDOMINAL HYSTERECTOMY  1991   APPENDECTOMY     BACK SURGERY     had 4 surgeries. Have rods placed in back - last 07/2021   BREAST BIOPSY Left    x2   BREAST ENHANCEMENT SURGERY  2006   CARDIAC CATHETERIZATION  12/22/2020   stents placed   CHOLECYSTECTOMY  12/2019   removed lap band.    COLONOSCOPY  12/01/2016    Moderate predominantly sigmod diverticulosis. Otherwise grossly normal colonoscopy   EYE SURGERY     IOL- bilateral - [place]   KNEE ARTHROSCOPY Left    lap band surgery  1981   LUMBAR FUSION  07/29/2015   posterior level one   removal of cervical disc fragments  10/2007   pt. denies    THORACIC LAMINECTOMY FOR SPINAL CORD STIMULATOR N/A 04/03/2024   Procedure: THORACIC SIX LAMINECTOMY FOR SPINAL CORD STIMULATOR;  Surgeon: Mavis Purchase, MD;  Location: [facility] OR;  Service: Neurosurgery;  Laterality: N/A;  SCS PER WITH PADDLE LEAD, T6   TUBAL LIGATION  1970    Family History  Problem Relation Age of Onset   Heart disease Mother    Heart disease Father    Diabetes Sister    Other Sister        BRAIN TUMOR   Heart disease Brother    Heart disease Brother    Heart disease Brother    Heart disease Brother    Heart disease Brother    Colon cancer Maternal Aunt    Colon cancer Cousin    Social History   Socioeconomic History   Marital status: Widowed    Spouse name: Not on file   Number of children: 2   Years of education: Not on file   Highest education level: Not on file  Occupational History   Occupation: retired    Comment: nurse  Tobacco Use   Smoking status: Never   Smokeless tobacco: Never  Vaping Use   Vaping status: Never Used  Substance and Sexual Activity   Alcohol use: No    Alcohol/week: 0.0 standard drinks of alcohol    Comment: recovery x 20 yrs   Drug  use: No   Sexual activity: Not Currently    Birth control/protection: None, Surgical    Comment: hysterectomy  Other Topics Concern   Not on file  Social History Narrative   Not on file   Social Drivers of Health   Financial Resource Strain: Low Risk  (12/12/2022)   Overall Financial Resource Strain (CARDIA)    Difficulty of Paying Living Expenses: Not hard at all  Food Insecurity: No Food Insecurity (04/03/2024)   Hunger Vital Sign    Worried About Running Out of Food in the Last Year: Never  true    Ran Out of Food in the Last Year: Never true  Transportation Needs: No Transportation Needs (04/03/2024)   PRAPARE - Administrator, Civil Service (Medical): No    Lack of Transportation (Non-Medical): No  Physical Activity: Inactive (12/12/2022)   Exercise Vital Sign    Days of Exercise per Week: 0 days    Minutes of Exercise per Session: 0 min  Stress: No Stress Concern Present (12/12/2022)   [employer] of Occupational Health - Occupational Stress Questionnaire    Feeling of Stress : Not at all  Social Connections: Moderately Isolated (04/03/2024)   Social Connection and Isolation Panel    Frequency of Communication with Friends and Family: Three times a week    Frequency of Social Gatherings with Friends and Family: Three times a week    Attends Religious Services: More than 4 times per year    Active Member of Clubs or Organizations: No    Attends Banker Meetings: Never    Marital Status: Widowed    Objective:  BP 116/62   Pulse (!) 104   Temp 98.2 F (36.8 C)   Ht 5' 5 (1.651 m)   Wt 151 lb (68.5 kg)   SpO2 96%   BMI 25.13 kg/m      04/24/2024    9:14 AM 04/10/2024   11:17 AM 04/04/2024   10:06 AM  BP/Weight  Systolic BP 116 112 135  Diastolic BP 62 74 66  Wt. (Lbs) 151 154   BMI 25.13 kg/m2 25.63 kg/m2     Physical Exam Vitals reviewed.  Constitutional:      Appearance: Normal appearance. She is normal weight.  Neck:     Vascular: No carotid bruit.  Cardiovascular:     Rate and Rhythm: Normal rate and regular rhythm.     Heart sounds: Normal heart sounds.  Pulmonary:     Effort: Pulmonary effort is normal. No respiratory distress.     Breath sounds: Normal breath sounds.  Abdominal:     General: Abdomen is flat. Bowel sounds are normal.     Palpations: Abdomen is soft.     Tenderness: There is abdominal tenderness (suprapubic).  Neurological:     Mental Status: She is alert and oriented to person, place, and  time.  Psychiatric:        Mood and Affect: Mood normal.        Behavior: Behavior normal.         Lab Results  Component Value Date   WBC 10.6 (H) 04/01/2024   HGB 12.0 04/01/2024   HCT 37.3 04/01/2024   PLT 355 04/01/2024   GLUCOSE 85 04/01/2024   CHOL 215 (H) 01/01/2024   TRIG 119 01/01/2024   HDL 55 01/01/2024   LDLCALC 139 (H) 01/01/2024   ALT 16 01/01/2024   AST 22 01/01/2024   NA 131 (L) 04/01/2024  K 3.9 04/01/2024   CL 95 (L) 04/01/2024   CREATININE 1.11 (H) 04/01/2024   BUN 17 04/01/2024   CO2 29 04/01/2024   TSH 2.580 01/01/2024   INR 1.0 06/10/2023   HGBA1C 5.3 04/24/2024    Results for orders placed or performed in visit on 04/24/24  POCT URINALYSIS DIP (CLINITEK)   Collection Time: 04/24/24  9:29 AM  Result Value Ref Range   Color, UA yellow yellow   Clarity, UA clear clear   Glucose, UA negative negative mg/dL   Bilirubin, UA negative negative   Ketones, POC UA negative negative mg/dL   Spec Grav, UA 8.984 8.989 - 1.025   Blood, UA negative negative   pH, UA 6.0 5.0 - 8.0   POC PROTEIN,UA negative negative, trace   Urobilinogen, UA 0.2 0.2 or 1.0 E.U./dL   Nitrite, UA Negative Negative   Leukocytes, UA Moderate (2+) (A) Negative  POCT glycosylated hemoglobin (Hb A1C)   Collection Time: 04/24/24  9:30 AM  Result Value Ref Range   Hemoglobin A1C     HbA1c POC (<> result, manual entry) 5.3 4.0 - 5.6 %   HbA1c, POC (prediabetic range)     HbA1c, POC (controlled diabetic range)    POCT Lipid Panel   Collection Time: 04/24/24  9:31 AM  Result Value Ref Range   TC 300    HDL 44    TRG 253    LDL 205    Non-HDL 256    TC/HDL    .  Assessment & Plan:   Assessment & Plan Prediabetes A1c controlled at 5.4%, likely due to Mounjaro , which was temporarily stopped.  Orders:   POCT glycosylated hemoglobin (Hb A1C)  Mixed hyperlipidemia Elevated cholesterol levels with LDL 205 mg/dL, triglycerides 746 mg/dL, total cholesterol 699 mg/dL.  Repatha was temporarily discontinued due to surgery, likely causing increased levels. Orders:   POCT Lipid Panel  Acute cystitis without hematuria Symptoms suggest bladder infection without dysuria. Previous culture sensitive to ciprofloxacin . Concern about bloodstream infection due to family history. - Prescribed ciprofloxacin  for bladder infection. - Scheduled follow-up to recheck urine post-antibiotics.  Orders:   POCT URINALYSIS DIP (CLINITEK)   Urine Culture   ciprofloxacin  (CIPRO ) 250 MG tablet; Take 1 tablet (250 mg total) by mouth 2 (two) times daily for 7 days.  Positive colorectal cancer screening using Cologuard test Refer to GI. Refer to Dr. Charlanne for colonoscopy. Orders:   Ambulatory referral to Gastroenterology  Stage 3b chronic kidney disease (HCC) eGFR improved to 51 mL/min/1.68m. Concern about kidney function due to past decline. - Check protein in urine. - Monitor kidney function regularly.      Body mass index is 25.13 kg/m.   Meds ordered this encounter  Medications   hydrOXYzine  (ATARAX ) 10 MG tablet    Sig: Take 1 tablet (10 mg total) by mouth daily as needed for itching.    Dispense:  90 tablet    Refill:  2   ciprofloxacin  (CIPRO ) 250 MG tablet    Sig: Take 1 tablet (250 mg total) by mouth 2 (two) times daily for 7 days.    Dispense:  14 tablet    Refill:  0    Orders Placed This Encounter  Procedures   Urine Culture   Ambulatory referral to Gastroenterology   POCT Lipid Panel   POCT glycosylated hemoglobin (Hb A1C)   POCT URINALYSIS DIP (CLINITEK)       Follow-up: Return in about 3 months (around 07/25/2024) for chronic  follow up. RETURN FOR PAEDIATRIC NURSE VISIT WITH NURSE OR KIM.  NEEDS RECHECK UA IN 1-2 WEEKS.   An After Visit Summary was printed and given to the patient.  Abigail Free, MD [facility] [phone]

## 2024-04-24 NOTE — Assessment & Plan Note (Addendum)
 Elevated cholesterol levels with LDL 205 mg/dL, triglycerides 746 mg/dL, total cholesterol 699 mg/dL. Repatha was temporarily discontinued due to surgery, likely causing increased levels. Orders:   POCT Lipid Panel

## 2024-04-24 NOTE — Assessment & Plan Note (Addendum)
 A1c controlled at 5.4%, likely due to Mounjaro , which was temporarily stopped.  Orders:   POCT glycosylated hemoglobin (Hb A1C)

## 2024-04-27 ENCOUNTER — Encounter: Payer: Self-pay | Admitting: Family Medicine

## 2024-04-27 DIAGNOSIS — R195 Other fecal abnormalities: Secondary | ICD-10-CM | POA: Insufficient documentation

## 2024-04-27 NOTE — Assessment & Plan Note (Signed)
 Refer to GI. Refer to Dr. Charlanne for colonoscopy. Orders:   Ambulatory referral to Gastroenterology

## 2024-04-27 NOTE — Assessment & Plan Note (Addendum)
 Symptoms suggest bladder infection without dysuria. Previous culture sensitive to ciprofloxacin . Concern about bloodstream infection due to family history. - Prescribed ciprofloxacin  for bladder infection. - Scheduled follow-up to recheck urine post-antibiotics.  Orders:   POCT URINALYSIS DIP (CLINITEK)   Urine Culture   ciprofloxacin  (CIPRO ) 250 MG tablet; Take 1 tablet (250 mg total) by mouth 2 (two) times daily for 7 days.

## 2024-04-27 NOTE — Assessment & Plan Note (Signed)
 eGFR improved to 51 mL/min/1.65m. Concern about kidney function due to past decline. - Check protein in urine. - Monitor kidney function regularly.

## 2024-04-28 ENCOUNTER — Encounter: Payer: Self-pay | Admitting: Family Medicine

## 2024-04-28 ENCOUNTER — Other Ambulatory Visit: Payer: Self-pay | Admitting: Family Medicine

## 2024-04-28 ENCOUNTER — Ambulatory Visit: Payer: Self-pay | Admitting: Family Medicine

## 2024-04-28 LAB — URINE CULTURE

## 2024-04-28 MED ORDER — HYDROXYZINE PAMOATE 25 MG PO CAPS: 25.0000 mg | ORAL_CAPSULE | Freq: Three times a day (TID) | ORAL | 2 refills | Status: AC | PRN

## 2024-04-29 ENCOUNTER — Other Ambulatory Visit: Payer: Self-pay

## 2024-04-29 ENCOUNTER — Ambulatory Visit: Admitting: Gastroenterology

## 2024-04-29 ENCOUNTER — Encounter: Payer: Self-pay | Admitting: Gastroenterology

## 2024-04-29 VITALS — BP 122/78 | HR 100 | Ht 65.0 in | Wt 154.0 lb

## 2024-04-29 DIAGNOSIS — K5909 Other constipation: Secondary | ICD-10-CM | POA: Diagnosis not present

## 2024-04-29 DIAGNOSIS — Z8719 Personal history of other diseases of the digestive system: Secondary | ICD-10-CM | POA: Diagnosis not present

## 2024-04-29 DIAGNOSIS — Z860100 Personal history of colon polyps, unspecified: Secondary | ICD-10-CM

## 2024-04-29 MED ORDER — NA SULFATE-K SULFATE-MG SULF 17.5-3.13-1.6 GM/177ML PO SOLN: 1.0000 | Freq: Once | ORAL | 0 refills | Status: AC

## 2024-04-29 NOTE — Progress Notes (Signed)
 Courtney [name] [id number] 12/01/1945   Chief Complaint: Colitis  Referring Provider: Sherre Clapper, MD Primary GI MD: Dr. Charlanne (has previously seen in [facility])  HPI: Courtney [name] is a 78 y.o. female with past medical history of anemia, anxiety/depression, arthritis, chronic idiopathic constipation, CAD s/p DES to LAD 12/2020, heart murmur, HTN, HLD, hypothyroidism, prior alcoholism, prediabetes, CKD stage IIIb, hysterectomy, appendectomy, cholecystectomy who presents today for a complaint of recent colitis.    Recent positive Cologuard test February 07, 2023, has not had follow-up colonoscopy, referred to GI, specifically Dr. Charlanne whom she has seen before.  No anemia on recent labs.  Last visit with cardiology 04/11/2024. Echo 11/2020 with EF 55-60%. CAD s/p DES to LAD 12/2020, on aspirin monotherapy. 1 year follow up advised.   Discussed the use of AI scribe software for clinical note transcription with the patient, who gave verbal consent to proceed.  History of Present Illness Courtney [name] is a 78 year old female with a history of diverticulitis who presents for evaluation following a recent hospitalization for sepsis and diverticulitis.  Diverticulitis and sepsis - Hospitalized in September 2025 for severe diverticulitis and sepsis at [facility]  - Lost consciousness during episode, resulting in a four-day hospital admission - Does not recall details of hospitalization  - No recurrence of similar episodes since September 2025 - Associated with left lower abdominal pain, resolved  Bowel habits and constipation - Constipation attributed to pain medication use following multiple back surgeries and Mounjaro  therapy - Uses stool softeners for management - Regular bowel movements, approximately once daily - No hematochezia or melena  Back pain and spinal cord stimulator - History of multiple back surgeries - Recent placement of spinal cord stimulator for back pain  management  Medication effects - Mounjaro  therapy for over one year, contributing to constipation - Previous use of dicyclomine  for abdominal spasms, found helpful but not currently using  Cardiovascular status - No chest pain or shortness of breath - Good cardiovascular status per echocardiogram in 2022   Previous GI Procedures/Imaging   CT A/P 09/08/2021 1. No evidence of nephrolithiasis or other acute genitourinary abnormality. 2. Aortic Atherosclerosis (ICD10-170.0) 3. Stable chronic pulmonary parenchymal changes consistent with known pulmonary fibrosis. 4. Colonic diverticular disease without CT evidence of active inflammation. 5. Dextroconvex scoliosis of the lumbar spine with epicenter at L2 accompanied by multilevel degenerative disc disease. 6. Evidence of prior L3-L4 PLIF without evidence of hardware complication.  Colonoscopy 12/01/2016  Moderate predominantly sigmoid diverticulosis Otherwise grossly normal colonoscopy Recall 5 years   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   Followed in Pulmonary clinic/ [facility]/ [place]    Cardiac dysrhythmia    Chronic idiopathic constipation    Colitis, acute 02/08/2021   Coronary artery disease    Depression, major, recurrent, moderate (HCC)    Fibromyalgia    Headache    Heart murmur    History of bladder infections    History of COVID-19 2020   Hyperlipidemia    Hypertension    Hypothyroidism    Knee pain, bilateral    Melena 02/08/2021   Osteoarthritis    Pneumonia    x several   Pre-diabetes 03/2021   Primary insomnia    Recovering alcoholic (HCC)  SVT (supraventricular tachycardia)    UTI (urinary tract infection)    Varicose veins    Vitamin B12 deficiency     Past Surgical History:  Procedure Laterality Date   ABDOMINAL  HYSTERECTOMY  1991   APPENDECTOMY     BACK SURGERY     had 4 surgeries. Have rods placed in back - last 07/2021   BREAST BIOPSY Left    x2   BREAST ENHANCEMENT SURGERY  2006   CARDIAC CATHETERIZATION  12/22/2020   stents placed   CHOLECYSTECTOMY  12/2019   removed lap band.    COLONOSCOPY  12/01/2016   Moderate predominantly sigmod diverticulosis. Otherwise grossly normal colonoscopy   EYE SURGERY     IOL- bilateral - [place]   KNEE ARTHROSCOPY Left    lap band surgery  1981   LUMBAR FUSION  07/29/2015   posterior level one   removal of cervical disc fragments  10/2007   pt. denies    THORACIC LAMINECTOMY FOR SPINAL CORD STIMULATOR N/A 04/03/2024   Procedure: THORACIC SIX LAMINECTOMY FOR SPINAL CORD STIMULATOR;  Surgeon: Mavis Purchase, MD;  Location: [facility] OR;  Service: Neurosurgery;  Laterality: N/A;  SCS PER WITH PADDLE LEAD, T6   TUBAL LIGATION  1970    Current Outpatient Medications  Medication Sig Dispense Refill   acyclovir  ointment (ZOVIRAX ) 5 % Apply 1 Application topically every 4 (four) hours as needed. 30 g 0   albuterol  (VENTOLIN  HFA) 108 (90 Base) MCG/ACT inhaler Inhale 2 puffs into the lungs every 6 (six) hours as needed for wheezing or shortness of breath. 8 g 2   aspirin EC 81 MG tablet Take 81 mg by mouth daily. Swallow whole.     buPROPion  (WELLBUTRIN  XL) 300 MG 24 hr tablet Take 1 tablet (300 mg total) by mouth daily. 90 tablet 0   Cholecalciferol (D3 PO) Take 2 tablets by mouth daily.     clobetasol (TEMOVATE) 0.05 % external solution Apply 1 Application topically 2 (two) times daily. 50 mL 0   Clobetasol Propionate 0.05 % shampoo Apply to dry scalp - let sit 10 min and then add water, later and rinse --- 2-3 x weekly. 118 mL 2   docusate sodium  (COLACE) 100 MG capsule Take 1 capsule (100 mg total) by mouth 2 (two) times daily. 30 capsule 0   DULoxetine  (CYMBALTA ) 60 MG capsule Take 1 capsule (60 mg total) by mouth 2 (two) times daily. 180 capsule 0    Fluticasone -Umeclidin-Vilant (TRELEGY ELLIPTA ) 100-62.5-25 MCG/ACT AEPB Inhale 1 puff into the lungs daily. 60 each 11   furosemide  (LASIX ) 20 MG tablet Take 1 tablet (20 mg total) by mouth daily as needed for edema. 90 tablet 2   HYDROcodone -acetaminophen  (NORCO/VICODIN) 5-325 MG tablet Take 1-2 tablets by mouth every 4 (four) hours as needed for moderate pain (pain score 4-6). 30 tablet 0   hydrocortisone  2.5 % ointment Apply to affected areas on face until symptoms improve. 30 g 1   hydrOXYzine  (VISTARIL ) 25 MG capsule Take 1 capsule (25 mg total) by mouth every 8 (eight) hours as needed. 90 capsule 2   levothyroxine  (SYNTHROID ) 75 MCG tablet TAKE 1 TABLET(75 MCG) BY MOUTH DAILY 90 tablet 1   lisinopril  (ZESTRIL ) 2.5 MG tablet Take 2.5 mg by mouth daily.     metoprolol  tartrate (LOPRESSOR ) 50 MG tablet TAKE 1 TABLET(50 MG) BY MOUTH TWICE DAILY WITH MEALS 180 tablet 1   montelukast  (SINGULAIR ) 10 MG tablet Take 1 tablet (10 mg total) by mouth  daily as needed (allergies). (Patient taking differently: Take 10 mg by mouth at bedtime.) 90 tablet 2   MOUNJARO  12.5 MG/0.5ML Pen ADMINISTER 12.5 MG UNDER THE SKIN 1 TIME A WEEK FOR 16 DOSES 2 mL 3   mupirocin ointment (BACTROBAN) 2 % Apply 1 Application topically 2 (two) times daily. 22 g 1   naloxone (NARCAN) nasal spray 4 mg/0.1 mL Place 1 spray into the nose once. In case of opioid overdose.     nitroGLYCERIN  (NITROSTAT ) 0.4 MG SL tablet DISSOLVE ONE TABLET UNDER TONGUE AS NEEDED FOR CHEST PAIN EVERY 5 MINUTES FOR UP TO 3 DOSES 25 tablet 1   pantoprazole  (PROTONIX ) 40 MG tablet Take 1 tablet (40 mg total) by mouth daily. 30 tablet 3   REPATHA SURECLICK 140 MG/ML SOAJ Inject 140 mg into the skin every 14 (fourteen) days.     tiZANidine  (ZANAFLEX ) 4 MG tablet Take 4 mg by mouth at bedtime as needed for muscle spasms.     tretinoin  (RETIN-A ) 0.025 % gel Apply 1 Application topically at bedtime. 45 g 0   valACYclovir  (VALTREX ) 1000 MG tablet Take one table  daily 90 tablet 1   ALPRAZolam  (XANAX ) 0.25 MG tablet Take 1 tablet (0.25 mg total) by mouth at bedtime as needed for anxiety. (Patient not taking: Reported on 04/29/2024) 10 tablet 0   Ascorbic Acid (VITAMIN C) 1000 MG tablet Take 1,000 mg by mouth daily. (Patient not taking: Reported on 04/29/2024)     ciprofloxacin  (CIPRO ) 250 MG tablet Take 1 tablet (250 mg total) by mouth 2 (two) times daily for 7 days. (Patient not taking: Reported on 04/29/2024) 14 tablet 0   dicyclomine  (BENTYL ) 20 MG tablet Take 1 tablet (20 mg total) by mouth 2 (two) times daily as needed for up to 15 days for spasms. (Patient not taking: Reported on 04/29/2024) 30 tablet 0   No current facility-administered medications for this visit.    Allergies as of 04/29/2024 - Review Complete 04/29/2024  Allergen Reaction Noted   Levofloxacin Hives, Other (See Comments), Shortness Of Breath, and Swelling 07/25/2014   Bactrim  [sulfamethoxazole -trimethoprim ] Hives and Itching 10/17/2019   Clarithromycin   02/17/2020   Lyrica [pregabalin]  05/11/2020   Nexletol [bempedoic acid]  04/25/2022   Statins Other (See Comments) 01/22/2013   Zetia  [ezetimibe ]  04/25/2022    Family History  Problem Relation Age of Onset   Heart disease Mother    Heart disease Father    Diabetes Sister    Other Sister        BRAIN TUMOR   Heart disease Brother    Heart disease Brother    Heart disease Brother    Heart disease Brother    Heart disease Brother    Colon cancer Maternal Aunt    Colon cancer Cousin     Social History   Tobacco Use   Smoking status: Never   Smokeless tobacco: Never  Vaping Use   Vaping status: Never Used  Substance Use Topics   Alcohol use: No    Alcohol/week: 0.0 standard drinks of alcohol    Comment: recovery x 20 yrs   Drug use: No     Review of Systems:    Constitutional: No fever, chills Cardiovascular: No chest pain Respiratory: No SOB  Gastrointestinal: See HPI and otherwise negative    Physical Exam:  Vital signs: BP 122/78   Pulse 100   Ht 5' 5 (1.651 m)   Wt 154 lb (69.9 kg)   BMI 25.63 kg/m  Constitutional: Pleasant, well-appearing female in NAD, alert and cooperative Head:  Normocephalic and atraumatic.  Eyes: No scleral icterus.  Respiratory: Respirations even and unlabored. Lungs clear to auscultation bilaterally.  No wheezes, crackles, or rhonchi.  Cardiovascular:  Regular rate and rhythm. No murmurs. No peripheral edema. Gastrointestinal:  Soft, nondistended, nontender. No rebound or guarding. Normal bowel sounds. No appreciable masses or hepatomegaly. Rectal:  Not performed.  Neurologic:  Alert and oriented x4;  grossly normal neurologically.  Skin:   Dry and intact without significant lesions or rashes. Psychiatric: Oriented to person, place and time. Demonstrates good judgement and reason without abnormal affect or behaviors.   RELEVANT LABS AND IMAGING: CBC    Component Value Date/Time   WBC 10.6 (H) 04/01/2024 1337   RBC 3.73 (L) 04/01/2024 1337   HGB 12.0 04/01/2024 1337   HGB 12.5 01/01/2024 1213   HCT 37.3 04/01/2024 1337   HCT 37.8 01/01/2024 1213   PLT 355 04/01/2024 1337   PLT 298 01/01/2024 1213   MCV 100.0 04/01/2024 1337   MCV 96 01/01/2024 1213   MCH 32.2 04/01/2024 1337   MCHC 32.2 04/01/2024 1337   RDW 14.0 04/01/2024 1337   RDW 12.6 01/01/2024 1213   LYMPHSABS 2.1 01/01/2024 1213   MONOABS 0.9 06/10/2023 1335   EOSABS 0.8 (H) 01/01/2024 1213   BASOSABS 0.1 01/01/2024 1213    CMP     Component Value Date/Time   NA 131 (L) 04/01/2024 1337   NA 131 (L) 01/01/2024 1213   K 3.9 04/01/2024 1337   CL 95 (L) 04/01/2024 1337   CO2 29 04/01/2024 1337   GLUCOSE 85 04/01/2024 1337   BUN 17 04/01/2024 1337   BUN 17 01/01/2024 1213   CREATININE 1.11 (H) 04/01/2024 1337   CALCIUM 9.4 04/01/2024 1337   PROT 7.3 01/01/2024 1213   ALBUMIN 4.4 01/01/2024 1213   AST 22 01/01/2024 1213   ALT 16 01/01/2024 1213   ALKPHOS 129 (H)  01/01/2024 1213   BILITOT 0.3 01/01/2024 1213   GFRNONAA 51 (L) 04/01/2024 1337   GFRAA 76 02/17/2020 1638     Assessment/Plan:   Assessment & Plan History of diverticulitis History of colon polyps Positive Cologuard test Patient hospitalized in September at [facility] with diverticulitis.  She does not recall the details of this hospitalization, states she lost consciousness during the episode and was admitted for 4 days.  Had been having some left lower quadrant abdominal pain which has since resolved.   Per PCP follow-up visit, patient had been on vacation at [facility], ate seafood and subsequently started to have diarrhea, after which had profuse sweating, felt lightheaded, had some chest discomfort.  Asked her daughter for nitroglycerin  sublingual which lowered her blood pressure and cause syncope for a few seconds, 911 was called, taken to the hospital where she received IV fluids.  Had extensive imaging to rule out stroke, heart attack which were negative.  Had CT scan of the abdomen which showed diffuse colitis in the sigmoid colon and she was given IV antibiotics, stabilized and discharged on oral antibiotics (Cipro  and Flagyl ). Last colonoscopy 2018 showed sigmoid diverticulosis and otherwise normal with 5-year recall recommended due to history of polyps.  She did have a Cologuard test 02/07/2023 which was positive and has not yet had follow-up colonoscopy.  Colonoscopy indicated for multiple reasons including positive Cologuard test, history of colon polyps, recent episode of diverticulitis requiring hospitalization.  - Schedule colonoscopy. I thoroughly discussed the procedure with the patient  to include nature of the procedure, alternatives, benefits, and risks (including but not limited to bleeding, infection, perforation, anesthesia/cardiac/pulmonary complications). Patient verbalized understanding and gave verbal consent to proceed with procedure.  - Consider surgical  consultation  - Advised high fiber diet - Will need to hold Mounjaro  prior to procedure  Constipation Chronic constipation likely due to pain medication and Mounjaro . Regular bowel movements with stool softeners.   - Continue stool softeners as needed.  Chronic pain, status post multiple back surgeries and spinal stimulator Chronic pain managed with spinal stimulator and medication. Recent adjustment improved mobility but discomfort persists. Medication contributes to constipation.    Camie Furbish, PA-C [facility] 04/29/2024, 10:36 AM  Patient Care Team: Sherre Clapper, MD as PCP - General (Internal Medicine) Dann Candyce RAMAN, MD as PCP - Cardiology (Cardiology) McGukin, Lynwood FABIENE Raddle., MD (Cardiology) [name], [name], MD as Consulting Physician (Pulmonary Disease) Nyle Rankin POUR, [facility] (Inactive) (Pharmacist)

## 2024-04-29 NOTE — Patient Instructions (Signed)

## 2024-05-02 ENCOUNTER — Telehealth: Payer: Self-pay

## 2024-05-02 NOTE — Telephone Encounter (Signed)
 PA submitted and approved via covermymeds for hydroxyzine .

## 2024-05-07 ENCOUNTER — Ambulatory Visit

## 2024-05-07 DIAGNOSIS — N3091 Cystitis, unspecified with hematuria: Secondary | ICD-10-CM | POA: Diagnosis not present

## 2024-05-07 DIAGNOSIS — N3000 Acute cystitis without hematuria: Secondary | ICD-10-CM | POA: Diagnosis not present

## 2024-05-07 DIAGNOSIS — R300 Dysuria: Secondary | ICD-10-CM | POA: Diagnosis not present

## 2024-05-10 ENCOUNTER — Other Ambulatory Visit: Payer: Self-pay | Admitting: Family Medicine

## 2024-05-10 DIAGNOSIS — R7303 Prediabetes: Secondary | ICD-10-CM

## 2024-05-10 DIAGNOSIS — Z79899 Other long term (current) drug therapy: Secondary | ICD-10-CM | POA: Diagnosis not present

## 2024-05-10 DIAGNOSIS — M961 Postlaminectomy syndrome, not elsewhere classified: Secondary | ICD-10-CM | POA: Diagnosis not present

## 2024-05-10 DIAGNOSIS — G894 Chronic pain syndrome: Secondary | ICD-10-CM | POA: Diagnosis not present

## 2024-05-10 NOTE — Telephone Encounter (Unsigned)
 Copied from CRM #[id number]. Topic: Clinical - Medication Refill >> May 10, 2024  9:17 AM Travis F wrote: Medication: MOUNJARO  12.5 MG/0.5ML Pen [[id number]]  Has the patient contacted their pharmacy? Yes  (Agent: If yes, when and what did the pharmacy advise?) contact office   This is the patient's preferred pharmacy:  [id number] [place], [place] - [address] AT [facility] [address] [place] [address] Phone: [phone] Fax: [phone]  Is this the correct pharmacy for this prescription? Yes If no, delete pharmacy and type the correct one.   Has the prescription been filled recently? Yes  Is the patient out of the medication? Yes  Has the patient been seen for an appointment in the last year OR does the patient have an upcoming appointment? Yes  Can we respond through MyChart? Yes  Agent: Please be advised that Rx refills may take up to 3 business days. We ask that you follow-up with your pharmacy.

## 2024-05-12 MED ORDER — MOUNJARO 12.5 MG/0.5ML ~~LOC~~ SOAJ: 12.5000 mg | SUBCUTANEOUS | 3 refills | Status: AC

## 2024-05-13 ENCOUNTER — Ambulatory Visit: Admitting: Urology

## 2024-05-13 DIAGNOSIS — R233 Spontaneous ecchymoses: Secondary | ICD-10-CM | POA: Diagnosis not present

## 2024-05-13 DIAGNOSIS — N3000 Acute cystitis without hematuria: Secondary | ICD-10-CM

## 2024-05-13 DIAGNOSIS — B029 Zoster without complications: Secondary | ICD-10-CM | POA: Diagnosis not present

## 2024-05-13 DIAGNOSIS — L281 Prurigo nodularis: Secondary | ICD-10-CM | POA: Diagnosis not present

## 2024-05-14 DIAGNOSIS — M47816 Spondylosis without myelopathy or radiculopathy, lumbar region: Secondary | ICD-10-CM | POA: Diagnosis not present

## 2024-05-14 DIAGNOSIS — M533 Sacrococcygeal disorders, not elsewhere classified: Secondary | ICD-10-CM | POA: Diagnosis not present

## 2024-05-14 DIAGNOSIS — M545 Low back pain, unspecified: Secondary | ICD-10-CM | POA: Diagnosis not present

## 2024-05-14 DIAGNOSIS — Z6826 Body mass index (BMI) 26.0-26.9, adult: Secondary | ICD-10-CM | POA: Diagnosis not present

## 2024-05-23 ENCOUNTER — Encounter: Payer: Self-pay | Admitting: Gastroenterology

## 2024-05-23 ENCOUNTER — Ambulatory Visit: Admitting: Gastroenterology

## 2024-05-23 VITALS — BP 132/81 | HR 79 | Resp 11

## 2024-05-23 DIAGNOSIS — K529 Noninfective gastroenteritis and colitis, unspecified: Secondary | ICD-10-CM | POA: Diagnosis not present

## 2024-05-23 DIAGNOSIS — Z860100 Personal history of colon polyps, unspecified: Secondary | ICD-10-CM | POA: Diagnosis not present

## 2024-05-23 DIAGNOSIS — I10 Essential (primary) hypertension: Secondary | ICD-10-CM | POA: Diagnosis not present

## 2024-05-23 DIAGNOSIS — K5730 Diverticulosis of large intestine without perforation or abscess without bleeding: Secondary | ICD-10-CM | POA: Diagnosis not present

## 2024-05-23 DIAGNOSIS — Z1211 Encounter for screening for malignant neoplasm of colon: Secondary | ICD-10-CM | POA: Diagnosis not present

## 2024-05-23 DIAGNOSIS — F419 Anxiety disorder, unspecified: Secondary | ICD-10-CM | POA: Diagnosis not present

## 2024-05-23 DIAGNOSIS — K640 First degree hemorrhoids: Secondary | ICD-10-CM | POA: Diagnosis not present

## 2024-05-23 DIAGNOSIS — E039 Hypothyroidism, unspecified: Secondary | ICD-10-CM | POA: Diagnosis not present

## 2024-05-23 DIAGNOSIS — R195 Other fecal abnormalities: Secondary | ICD-10-CM | POA: Diagnosis not present

## 2024-05-23 DIAGNOSIS — F322 Major depressive disorder, single episode, severe without psychotic features: Secondary | ICD-10-CM | POA: Diagnosis not present

## 2024-05-23 DIAGNOSIS — K559 Vascular disorder of intestine, unspecified: Secondary | ICD-10-CM | POA: Diagnosis not present

## 2024-05-23 MED ORDER — SODIUM CHLORIDE 0.9 % IV SOLN: 500.0000 mL | Freq: Once | INTRAVENOUS | Status: DC

## 2024-05-23 NOTE — Patient Instructions (Signed)
 YOU HAD AN ENDOSCOPIC PROCEDURE TODAY AT THE [facility]:   Refer to the procedure report that was given to you for any specific questions about what was found during the examination.  If the procedure report does not answer your questions, please call your gastroenterologist to clarify.  If you requested that your care partner not be given the details of your procedure findings, then the procedure report has been included in a sealed envelope for you to review at your convenience later.  YOU SHOULD EXPECT: Some feelings of bloating in the abdomen. Passage of more gas than usual.  Walking can help get rid of the air that was put into your GI tract during the procedure and reduce the bloating. If you had a lower endoscopy (such as a colonoscopy or flexible sigmoidoscopy) you may notice spotting of blood in your stool or on the toilet paper. If you underwent a bowel prep for your procedure, you may not have a normal bowel movement for a few days.  Please Note:  You might notice some irritation and congestion in your nose or some drainage.  This is from the oxygen  used during your procedure.  There is no need for concern and it should clear up in a day or so.  SYMPTOMS TO REPORT IMMEDIATELY:  Following lower endoscopy (colonoscopy or flexible sigmoidoscopy):  Excessive amounts of blood in the stool  Significant tenderness or worsening of abdominal pains  Swelling of the abdomen that is new, acute  Fever of 100F or higher  High fiber diet Continue present medications including stool softeners and Miralax as needed Await pathology results Handout on hemorrhoids and diverticulosis given   For urgent or emergent issues, a gastroenterologist can be reached at any hour by calling [phone]. Do not use MyChart messaging for urgent concerns.    DIET:  We do recommend a small meal at first, but then you may proceed to your regular diet.  Drink plenty of fluids but you should avoid  alcoholic beverages for 24 hours.  ACTIVITY:  You should plan to take it easy for the rest of today and you should NOT DRIVE or use heavy machinery until tomorrow (because of the sedation medicines used during the test).    FOLLOW UP: Our staff will call the number listed on your records the next business day following your procedure.  We will call around 7:15- 8:00 am to check on you and address any questions or concerns that you may have regarding the information given to you following your procedure. If we do not reach you, we will leave a message.     If any biopsies were taken you will be contacted by phone or by letter within the next 1-3 weeks.  Please call us  at [phone] if you have not heard about the biopsies in 3 weeks.    SIGNATURES/CONFIDENTIALITY: You and/or your care partner have signed paperwork which will be entered into your electronic medical record.  These signatures attest to the fact that that the information above on your After Visit Summary has been reviewed and is understood.  Full responsibility of the confidentiality of this discharge information lies with you and/or your care-partner.

## 2024-05-23 NOTE — Progress Notes (Signed)
 1503 Ephedrine 10 mg given IV due to low BP, MD updated.

## 2024-05-23 NOTE — Progress Notes (Signed)
 Called to room to assist during endoscopic procedure.  Patient ID and intended procedure confirmed with present staff. Received instructions for my participation in the procedure from the performing physician.

## 2024-05-23 NOTE — Progress Notes (Signed)
 Report given to PACU, vss

## 2024-05-23 NOTE — Progress Notes (Signed)
 Pt's states no medical or surgical changes since previsit or office visit.

## 2024-05-23 NOTE — Progress Notes (Signed)
 ETHAN KASPERSKI [id number] [birth year]     Chief Complaint: Colitis   Referring Provider: Sherre Clapper, MD Primary GI MD: Dr. Charlanne (has previously seen in [facility])   HPI: CREE NAPOLI is a 78 y.o. female with past medical history of anemia, anxiety/depression, arthritis, chronic idiopathic constipation, CAD s/p DES to LAD 12/2020, heart murmur, HTN, HLD, hypothyroidism, prior alcoholism, prediabetes, CKD stage IIIb, hysterectomy, appendectomy, cholecystectomy who presents today for a complaint of recent colitis.     Recent positive Cologuard test February 07, 2023, has not had follow-up colonoscopy, referred to GI, specifically Dr. Charlanne whom she has seen before.   No anemia on recent labs.   Last visit with cardiology 04/11/2024. Echo 11/2020 with EF 55-60%. CAD s/p DES to LAD 12/2020, on aspirin monotherapy. 1 year follow up advised.     Discussed the use of AI scribe software for clinical note transcription with the patient, who gave verbal consent to proceed.   History of Present Illness ADORE KITHCART is a 78 year old female with a history of diverticulitis who presents for evaluation following a recent hospitalization for sepsis and diverticulitis.   Diverticulitis and sepsis - Hospitalized in September 2025 for severe diverticulitis and sepsis at [facility]  - Lost consciousness during episode, resulting in a four-day hospital admission - Does not recall details of hospitalization  - No recurrence of similar episodes since September 2025 - Associated with left lower abdominal pain, resolved   Bowel habits and constipation - Constipation attributed to pain medication use following multiple back surgeries and Mounjaro  therapy - Uses stool softeners for management - Regular bowel movements, approximately once daily - No hematochezia or melena   Back pain and spinal cord stimulator - History of multiple back surgeries - Recent placement of spinal cord stimulator for  back pain management   Medication effects - Mounjaro  therapy for over one year, contributing to constipation - Previous use of dicyclomine  for abdominal spasms, found helpful but not currently using   Cardiovascular status - No chest pain or shortness of breath - Good cardiovascular status per echocardiogram in 2022     Previous GI Procedures/Imaging    CT A/P 09/08/2021 1. No evidence of nephrolithiasis or other acute genitourinary abnormality. 2. Aortic Atherosclerosis (ICD10-170.0) 3. Stable chronic pulmonary parenchymal changes consistent with known pulmonary fibrosis. 4. Colonic diverticular disease without CT evidence of active inflammation. 5. Dextroconvex scoliosis of the lumbar spine with epicenter at L2 accompanied by multilevel degenerative disc disease. 6. Evidence of prior L3-L4 PLIF without evidence of hardware complication.   Colonoscopy 12/01/2016  Moderate predominantly sigmoid diverticulosis Otherwise grossly normal colonoscopy Recall 5 years         Past Medical History:  Diagnosis Date   Ambulates with cane      4 prong cane   Anemia     Anxiety     Arthritis     Bronchiectasis (HCC)     CAP (community acquired pneumonia)  vs Eosinophilic Pna 07/25/2014    Followed in Pulmonary clinic/ [facility]/ [place]    Cardiac dysrhythmia     Chronic idiopathic constipation     Colitis, acute 02/08/2021   Coronary artery disease     Depression, major, recurrent, moderate (HCC)     Fibromyalgia     Headache     Heart murmur     History of bladder infections     History of COVID-19 2020   Hyperlipidemia     Hypertension  Hypothyroidism     Knee pain, bilateral     Melena 02/08/2021   Osteoarthritis     Pneumonia      x several   Pre-diabetes 03/2021   Primary insomnia     Recovering alcoholic (HCC)     SVT (supraventricular tachycardia)     UTI (urinary tract infection)     Varicose veins     Vitamin B12 deficiency                  Past Surgical History:  Procedure Laterality Date   ABDOMINAL HYSTERECTOMY   1991   APPENDECTOMY       BACK SURGERY        had 4 surgeries. Have rods placed in back - last 07/2021   BREAST BIOPSY Left      x2   BREAST ENHANCEMENT SURGERY   2006   CARDIAC CATHETERIZATION   12/22/2020    stents placed   CHOLECYSTECTOMY   12/2019    removed lap band.    COLONOSCOPY   12/01/2016    Moderate predominantly sigmod diverticulosis. Otherwise grossly normal colonoscopy   EYE SURGERY        IOL- bilateral - [place]   KNEE ARTHROSCOPY Left     lap band surgery   1981   LUMBAR FUSION   07/29/2015    posterior level one   removal of cervical disc fragments   10/2007    pt. denies    THORACIC LAMINECTOMY FOR SPINAL CORD STIMULATOR N/A 04/03/2024    Procedure: THORACIC SIX LAMINECTOMY FOR SPINAL CORD STIMULATOR;  Surgeon: Mavis Purchase, MD;  Location: [facility] OR;  Service: Neurosurgery;  Laterality: N/A;  SCS PER WITH PADDLE LEAD, T6   TUBAL LIGATION   1970                Current Outpatient Medications  Medication Sig Dispense Refill   acyclovir  ointment (ZOVIRAX ) 5 % Apply 1 Application topically every 4 (four) hours as needed. 30 g 0   albuterol  (VENTOLIN  HFA) 108 (90 Base) MCG/ACT inhaler Inhale 2 puffs into the lungs every 6 (six) hours as needed for wheezing or shortness of breath. 8 g 2   aspirin EC 81 MG tablet Take 81 mg by mouth daily. Swallow whole.       buPROPion  (WELLBUTRIN  XL) 300 MG 24 hr tablet Take 1 tablet (300 mg total) by mouth daily. 90 tablet 0   Cholecalciferol (D3 PO) Take 2 tablets by mouth daily.       clobetasol  (TEMOVATE ) 0.05 % external solution Apply 1 Application topically 2 (two) times daily. 50 mL 0   Clobetasol  Propionate 0.05 % shampoo Apply to dry scalp - let sit 10 min and then add water, later and rinse --- 2-3 x weekly. 118 mL 2   docusate sodium  (COLACE) 100 MG capsule Take 1 capsule (100 mg total) by mouth 2 (two) times daily. 30 capsule 0    DULoxetine  (CYMBALTA ) 60 MG capsule Take 1 capsule (60 mg total) by mouth 2 (two) times daily. 180 capsule 0   Fluticasone -Umeclidin-Vilant (TRELEGY ELLIPTA ) 100-62.5-25 MCG/ACT AEPB Inhale 1 puff into the lungs daily. 60 each 11   furosemide  (LASIX ) 20 MG tablet Take 1 tablet (20 mg total) by mouth daily as needed for edema. 90 tablet 2   HYDROcodone -acetaminophen  (NORCO/VICODIN) 5-325 MG tablet Take 1-2 tablets by mouth every 4 (four) hours as needed for moderate pain (pain score 4-6). 30 tablet 0   hydrocortisone  2.5 %  ointment Apply to affected areas on face until symptoms improve. 30 g 1   hydrOXYzine  (VISTARIL ) 25 MG capsule Take 1 capsule (25 mg total) by mouth every 8 (eight) hours as needed. 90 capsule 2   levothyroxine  (SYNTHROID ) 75 MCG tablet TAKE 1 TABLET(75 MCG) BY MOUTH DAILY 90 tablet 1   lisinopril  (ZESTRIL ) 2.5 MG tablet Take 2.5 mg by mouth daily.       metoprolol  tartrate (LOPRESSOR ) 50 MG tablet TAKE 1 TABLET(50 MG) BY MOUTH TWICE DAILY WITH MEALS 180 tablet 1   montelukast  (SINGULAIR ) 10 MG tablet Take 1 tablet (10 mg total) by mouth daily as needed (allergies). (Patient taking differently: Take 10 mg by mouth at bedtime.) 90 tablet 2   MOUNJARO  12.5 MG/0.5ML Pen ADMINISTER 12.5 MG UNDER THE SKIN 1 TIME A WEEK FOR 16 DOSES 2 mL 3   mupirocin  ointment (BACTROBAN ) 2 % Apply 1 Application topically 2 (two) times daily. 22 g 1   naloxone (NARCAN) nasal spray 4 mg/0.1 mL Place 1 spray into the nose once. In case of opioid overdose.       nitroGLYCERIN  (NITROSTAT ) 0.4 MG SL tablet DISSOLVE ONE TABLET UNDER TONGUE AS NEEDED FOR CHEST PAIN EVERY 5 MINUTES FOR UP TO 3 DOSES 25 tablet 1   pantoprazole  (PROTONIX ) 40 MG tablet Take 1 tablet (40 mg total) by mouth daily. 30 tablet 3   REPATHA SURECLICK 140 MG/ML SOAJ Inject 140 mg into the skin every 14 (fourteen) days.       tiZANidine  (ZANAFLEX ) 4 MG tablet Take 4 mg by mouth at bedtime as needed for muscle spasms.       tretinoin   (RETIN-A ) 0.025 % gel Apply 1 Application topically at bedtime. 45 g 0   valACYclovir  (VALTREX ) 1000 MG tablet Take one table daily 90 tablet 1   ALPRAZolam  (XANAX ) 0.25 MG tablet Take 1 tablet (0.25 mg total) by mouth at bedtime as needed for anxiety. (Patient not taking: Reported on 04/29/2024) 10 tablet 0   Ascorbic Acid (VITAMIN C) 1000 MG tablet Take 1,000 mg by mouth daily. (Patient not taking: Reported on 04/29/2024)       ciprofloxacin  (CIPRO ) 250 MG tablet Take 1 tablet (250 mg total) by mouth 2 (two) times daily for 7 days. (Patient not taking: Reported on 04/29/2024) 14 tablet 0   dicyclomine  (BENTYL ) 20 MG tablet Take 1 tablet (20 mg total) by mouth 2 (two) times daily as needed for up to 15 days for spasms. (Patient not taking: Reported on 04/29/2024) 30 tablet 0      No current facility-administered medications for this visit.             Allergies as of 04/29/2024 - Review Complete 04/29/2024  Allergen Reaction Noted   Levofloxacin Hives, Other (See Comments), Shortness Of Breath, and Swelling 07/25/2014   Bactrim  [sulfamethoxazole -trimethoprim ] Hives and Itching 10/17/2019   Clarithromycin    02/17/2020   Lyrica [pregabalin]   05/11/2020   Nexletol [bempedoic acid]   04/25/2022   Statins Other (See Comments) 01/22/2013   Zetia  [ezetimibe ]   04/25/2022           Family History  Problem Relation Age of Onset   Heart disease Mother     Heart disease Father     Diabetes Sister     Other Sister          BRAIN TUMOR   Heart disease Brother     Heart disease Brother     Heart disease Brother  Heart disease Brother     Heart disease Brother     Colon cancer Maternal Aunt     Colon cancer Cousin            Social History  Social History         Tobacco Use   Smoking status: Never   Smokeless tobacco: Never  Vaping Use   Vaping status: Never Used  Substance Use Topics   Alcohol use: No      Alcohol/week: 0.0 standard drinks of alcohol      Comment:  recovery x 20 yrs   Drug use: No        Review of Systems:    Constitutional: No fever, chills Cardiovascular: No chest pain Respiratory: No SOB  Gastrointestinal: See HPI and otherwise negative    Physical Exam:  Vital signs: BP 122/78   Pulse 100   Ht 5' 5 (1.651 m)   Wt 154 lb (69.9 kg)   BMI 25.63 kg/m    Constitutional: Pleasant, well-appearing female in NAD, alert and cooperative Head:  Normocephalic and atraumatic.  Eyes: No scleral icterus.  Respiratory: Respirations even and unlabored. Lungs clear to auscultation bilaterally.  No wheezes, crackles, or rhonchi.  Cardiovascular:  Regular rate and rhythm. No murmurs. No peripheral edema. Gastrointestinal:  Soft, nondistended, nontender. No rebound or guarding. Normal bowel sounds. No appreciable masses or hepatomegaly. Rectal:  Not performed.  Neurologic:  Alert and oriented x4;  grossly normal neurologically.  Skin:   Dry and intact without significant lesions or rashes. Psychiatric: Oriented to person, place and time. Demonstrates good judgement and reason without abnormal affect or behaviors.     RELEVANT LABS AND IMAGING: CBC Labs (Brief)          Component Value Date/Time    WBC 10.6 (H) 04/01/2024 1337    RBC 3.73 (L) 04/01/2024 1337    HGB 12.0 04/01/2024 1337    HGB 12.5 01/01/2024 1213    HCT 37.3 04/01/2024 1337    HCT 37.8 01/01/2024 1213    PLT 355 04/01/2024 1337    PLT 298 01/01/2024 1213    MCV 100.0 04/01/2024 1337    MCV 96 01/01/2024 1213    MCH 32.2 04/01/2024 1337    MCHC 32.2 04/01/2024 1337    RDW 14.0 04/01/2024 1337    RDW 12.6 01/01/2024 1213    LYMPHSABS 2.1 01/01/2024 1213    MONOABS 0.9 06/10/2023 1335    EOSABS 0.8 (H) 01/01/2024 1213    BASOSABS 0.1 01/01/2024 1213        CMP     Labs (Brief)          Component Value Date/Time    NA 131 (L) 04/01/2024 1337    NA 131 (L) 01/01/2024 1213    K 3.9 04/01/2024 1337    CL 95 (L) 04/01/2024 1337    CO2 29 04/01/2024  1337    GLUCOSE 85 04/01/2024 1337    BUN 17 04/01/2024 1337    BUN 17 01/01/2024 1213    CREATININE 1.11 (H) 04/01/2024 1337    CALCIUM 9.4 04/01/2024 1337    PROT 7.3 01/01/2024 1213    ALBUMIN 4.4 01/01/2024 1213    AST 22 01/01/2024 1213    ALT 16 01/01/2024 1213    ALKPHOS 129 (H) 01/01/2024 1213    BILITOT 0.3 01/01/2024 1213    GFRNONAA 51 (L) 04/01/2024 1337    GFRAA 76 02/17/2020 1638  Assessment/Plan:    Assessment & Plan History of diverticulitis History of colon polyps Positive Cologuard test Patient hospitalized in September at [facility] with diverticulitis.  She does not recall the details of this hospitalization, states she lost consciousness during the episode and was admitted for 4 days.  Had been having some left lower quadrant abdominal pain which has since resolved.   Per PCP follow-up visit, patient had been on vacation at [facility], ate seafood and subsequently started to have diarrhea, after which had profuse sweating, felt lightheaded, had some chest discomfort.  Asked her daughter for nitroglycerin  sublingual which lowered her blood pressure and cause syncope for a few seconds, 911 was called, taken to the hospital where she received IV fluids.  Had extensive imaging to rule out stroke, heart attack which were negative.  Had CT scan of the abdomen which showed diffuse colitis in the sigmoid colon and she was given IV antibiotics, stabilized and discharged on oral antibiotics (Cipro  and Flagyl ). Last colonoscopy 2018 showed sigmoid diverticulosis and otherwise normal with 5-year recall recommended due to history of polyps.  She did have a Cologuard test 02/07/2023 which was positive and has not yet had follow-up colonoscopy.   Colonoscopy indicated for multiple reasons including positive Cologuard test, history of colon polyps, recent episode of diverticulitis requiring hospitalization.   - Schedule colonoscopy. I thoroughly discussed the procedure  with the patient to include nature of the procedure, alternatives, benefits, and risks (including but not limited to bleeding, infection, perforation, anesthesia/cardiac/pulmonary complications). Patient verbalized understanding and gave verbal consent to proceed with procedure.  - Consider surgical consultation  - Advised high fiber diet - Will need to hold Mounjaro  prior to procedure   Constipation Chronic constipation likely due to pain medication and Mounjaro . Regular bowel movements with stool softeners.    - Continue stool softeners as needed.   Chronic pain, status post multiple back surgeries and spinal stimulator Chronic pain managed with spinal stimulator and medication. Recent adjustment improved mobility but discomfort persists. Medication contributes to constipation.       Camie Furbish, PA-C    Attending physician's note   I have taken history, reviewed the chart and examined the patient. I performed a substantive portion of this encounter, including complete performance of at least one of the key components, in conjunction with the APP. I agree with the Advanced Practitioner's note, impression and recommendations.   For colon   Anselm Bring, MD [facility] [phone]

## 2024-05-23 NOTE — Progress Notes (Signed)
1513 Ephedrine 10 mg given IV due to low BP, MD updated.   

## 2024-05-23 NOTE — Op Note (Signed)
 [facility] Patient Name: Courtney [name] Procedure Date: 05/23/2024 2:49 PM MRN: [id number] Endoscopist: Lynnie Bring , MD, [id number] Age: 78 Referring MD:  Date of Birth: October 10, 1945 Gender: Female Account #: [id number] Procedure:                Colonoscopy Indications:              H/O diverticulitis. Positive Cologuard. Medicines:                Monitored Anesthesia Care Procedure:                Pre-Anesthesia Assessment:                           - Prior to the procedure, a History and Physical                            was performed, and patient medications and                            allergies were reviewed. The patient's tolerance of                            previous anesthesia was also reviewed. The risks                            and benefits of the procedure and the sedation                            options and risks were discussed with the patient.                            All questions were answered, and informed consent                            was obtained. Prior Anticoagulants: The patient has                            taken no anticoagulant or antiplatelet agents. ASA                            Grade Assessment: III - A patient with severe                            systemic disease. After reviewing the risks and                            benefits, the patient was deemed in satisfactory                            condition to undergo the procedure.                           After obtaining informed consent, the colonoscope  was passed under direct vision. Throughout the                            procedure, the patient's blood pressure, pulse, and                            oxygen  saturations were monitored continuously. The                            Olympus Scope PCF [id number] was introduced through                            the anus and advanced to the the cecum, identified                            by appendiceal  orifice and ileocecal valve. The                            colonoscopy was somewhat difficult due to bowel                            prep and extensive sigmoid diverticulosis.                            Successful completion of the procedure was aided by                            lavage. The patient tolerated the procedure well.                            The quality of the bowel preparation was fair.                            Overall over 85 - 90% the colonic mucosa was                            visualized satisfactorily after lavage. The                            ileocecal valve, appendiceal orifice, and rectum                            were photographed. Scope In: 3:00:55 PM Scope Out: 3:27:46 PM Scope Withdrawal Time: 0 hours 16 minutes 47 seconds  Total Procedure Duration: 0 hours 26 minutes 51 seconds  Findings:                 Localized area (2 cm x 2 cm) moderate inflammation                            characterized by congestion (edema), erosions,                            superficial ulcers and erythema was found at the  splenic flexure. Some scarring was noted in the                            vicinity with a wide open stricture s/o previous                            ischemic insults. Biopsies were taken with a cold                            forceps for histology.                           Multiple medium-mouthed diverticula were found in                            the sigmoid colon, descending colon and transverse                            colon. Rare diverticula in the ascending colon.                            There was luminal narrowing consistent with                            muscular hypertrophy in the sigmoid colon.                           Non-bleeding internal hemorrhoids were found during                            retroflexion. The hemorrhoids were small and Grade                            I (internal hemorrhoids that do not  prolapse).                           The exam was otherwise without abnormality on                            direct and retroflexion views. Complications:            No immediate complications. Estimated Blood Loss:     Estimated blood loss: none. Impression:               - Localized moderate inflammation was found at the                            splenic flexure secondary to ?Ischemic colitis.                            Biopsied.                           - Pancolonic diverticulosis predominantly in the  sigmoid colon.                           - Non-bleeding internal hemorrhoids.                           - The examination was otherwise normal on direct                            and retroflexion views. Exam somewhat limited due                            to quality of prep. Recommendation:           - Patient has a contact number available for                            emergencies. The signs and symptoms of potential                            delayed complications were discussed with the                            patient. Return to normal activities tomorrow.                            Written discharge instructions were provided to the                            patient.                           - High fiber diet.                           - Continue present medications including stool                            softeners and MiraLAX as needed.                           - Await pathology results.                           - Repeat colonoscopy for surveillance based on                            pathology results.                           - The findings and recommendations were discussed                            with the patient. Lynnie Bring, MD 05/23/2024 3:37:50 PM This report has been signed electronically.

## 2024-05-24 ENCOUNTER — Telehealth: Payer: Self-pay | Admitting: *Deleted

## 2024-05-24 NOTE — Telephone Encounter (Signed)
  Follow up Call-     05/23/2024    2:02 PM  Call back number  Post procedure Call Back phone  # [phone]  Permission to leave phone message Yes     Patient questions:  Do you have a fever, pain , or abdominal swelling? No. Pain Score  0 *  Have you tolerated food without any problems? Yes.    Have you been able to return to your normal activities? Yes.    Do you have any questions about your discharge instructions: Diet   No. Medications  No. Follow up visit  No.  Do you have questions or concerns about your Care? No.  Actions: * If pain score is 4 or above: No action needed, pain <4.

## 2024-05-28 LAB — SURGICAL PATHOLOGY

## 2024-06-01 ENCOUNTER — Ambulatory Visit: Payer: Self-pay | Admitting: Gastroenterology

## 2024-06-06 ENCOUNTER — Ambulatory Visit: Admitting: Family Medicine

## 2024-06-06 ENCOUNTER — Encounter: Payer: Self-pay | Admitting: Family Medicine

## 2024-06-06 VITALS — BP 140/82 | HR 74 | Temp 97.8°F | Resp 16 | Ht 65.0 in | Wt 153.0 lb

## 2024-06-06 DIAGNOSIS — J471 Bronchiectasis with (acute) exacerbation: Secondary | ICD-10-CM | POA: Diagnosis not present

## 2024-06-06 DIAGNOSIS — J0110 Acute frontal sinusitis, unspecified: Secondary | ICD-10-CM | POA: Diagnosis not present

## 2024-06-06 DIAGNOSIS — I110 Hypertensive heart disease with heart failure: Secondary | ICD-10-CM

## 2024-06-06 LAB — POC INFLUENZA A&B (BINAX/QUICKVUE)
Influenza A, POC: NEGATIVE
Influenza B, POC: NEGATIVE

## 2024-06-06 LAB — POC COVID19 BINAXNOW: SARS Coronavirus 2 Ag: NEGATIVE

## 2024-06-06 MED ORDER — AMOXICILLIN-POT CLAVULANATE 875-125 MG PO TABS: 1.0000 | ORAL_TABLET | Freq: Two times a day (BID) | ORAL | 0 refills | Status: AC

## 2024-06-06 MED ORDER — GUAIFENESIN ER 600 MG PO TB12: 600.0000 mg | ORAL_TABLET | Freq: Two times a day (BID) | ORAL | 0 refills | Status: AC | PRN

## 2024-06-06 NOTE — Progress Notes (Unsigned)
 Acute Office Visit  Subjective:    Patient ID: Courtney [name], female    DOB: 12-Dec-1945, 78 y.o.   MRN: [id number]  Chief Complaint  Patient presents with   Nasal Congestion    Chest congestion    HPI: Patient is in today for ***  Discussed the use of AI scribe software for clinical note transcription with the patient, who gave verbal consent to proceed.  History of Present Illness         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   Followed in Pulmonary clinic/ [facility]/ [place]    Cardiac dysrhythmia    Chronic idiopathic constipation    Colitis, acute 02/08/2021   Coronary artery disease    Depression, major, recurrent, moderate (HCC)    Fibromyalgia    Headache    Heart murmur    History of bladder infections    History of COVID-19 2020   Hyperlipidemia    Hypertension    Hypothyroidism    Knee pain, bilateral    Melena 02/08/2021   Osteoarthritis    Pneumonia    x several   Pre-diabetes 03/2021   Primary insomnia    Recovering alcoholic (HCC)    SVT (supraventricular tachycardia)    UTI (urinary tract infection)    Varicose veins    Vitamin B12 deficiency     Past Surgical History:  Procedure Laterality Date   ABDOMINAL HYSTERECTOMY  1991   APPENDECTOMY     BACK SURGERY     had 4 surgeries. Have rods placed in back - last 07/2021   BREAST BIOPSY Left    x2   BREAST ENHANCEMENT SURGERY  2006   CARDIAC CATHETERIZATION  12/22/2020   stents placed   CHOLECYSTECTOMY  12/2019   removed lap band.    COLONOSCOPY  12/01/2016   Moderate predominantly sigmod diverticulosis. Otherwise grossly normal colonoscopy   EYE SURGERY     IOL- bilateral - [place]   KNEE ARTHROSCOPY Left    lap band surgery  1981   LUMBAR FUSION  07/29/2015    posterior level one   removal of cervical disc fragments  10/2007   pt. denies    THORACIC LAMINECTOMY FOR SPINAL CORD STIMULATOR N/A 04/03/2024   Procedure: THORACIC SIX LAMINECTOMY FOR SPINAL CORD STIMULATOR;  Surgeon: Mavis Purchase, MD;  Location: [facility] OR;  Service: Neurosurgery;  Laterality: N/A;  SCS PER WITH PADDLE LEAD, T6   TUBAL LIGATION  1970    Family History  Problem Relation Age of Onset   Heart disease Mother    Heart disease Father    Diabetes Sister    Other Sister        BRAIN TUMOR   Heart disease Brother    Heart disease Brother    Heart disease Brother    Heart disease Brother    Heart disease Brother    Colon cancer Maternal Aunt    Colon cancer Cousin    Esophageal cancer Neg Hx    Rectal cancer Neg Hx    Stomach cancer Neg Hx     Social History   Socioeconomic History   Marital status: Widowed    Spouse name: Not on file   Number of children: 2   Years of education: Not  on file   Highest education level: Not on file  Occupational History   Occupation: retired    Comment: nurse  Tobacco Use   Smoking status: Never   Smokeless tobacco: Never  Vaping Use   Vaping status: Never Used  Substance and Sexual Activity   Alcohol use: No    Alcohol/week: 0.0 standard drinks of alcohol    Comment: recovery x 20 yrs   Drug use: No   Sexual activity: Not Currently    Birth control/protection: None, Surgical    Comment: hysterectomy  Other Topics Concern   Not on file  Social History Narrative   Not on file   Social Drivers of Health   Tobacco Use: Low Risk (06/06/2024)   Patient History    Smoking Tobacco Use: Never    Smokeless Tobacco Use: Never    Passive Exposure: Not on file  Financial Resource Strain: Low Risk (12/12/2022)   Overall Financial Resource Strain (CARDIA)    Difficulty of Paying Living Expenses: Not hard at all  Food Insecurity: No Food Insecurity (04/03/2024)   Epic    Worried About  Programme Researcher, Broadcasting/film/video in the Last Year: Never true    Ran Out of Food in the Last Year: Never true  Transportation Needs: No Transportation Needs (04/03/2024)   Epic    Lack of Transportation (Medical): No    Lack of Transportation (Non-Medical): No  Physical Activity: Inactive (12/12/2022)   Exercise Vital Sign    Days of Exercise per Week: 0 days    Minutes of Exercise per Session: 0 min  Stress: No Stress Concern Present (12/12/2022)   [employer] of Occupational Health - Occupational Stress Questionnaire    Feeling of Stress : Not at all  Social Connections: Moderately Isolated (04/03/2024)   Social Connection and Isolation Panel    Frequency of Communication with Friends and Family: Three times a week    Frequency of Social Gatherings with Friends and Family: Three times a week    Attends Religious Services: More than 4 times per year    Active Member of Clubs or Organizations: No    Attends Banker Meetings: Never    Marital Status: Widowed  Intimate Partner Violence: Not At Risk (04/03/2024)   Epic    Fear of Current or Ex-Partner: No    Emotionally Abused: No    Physically Abused: No    Sexually Abused: No  Depression (PHQ2-9): High Risk (02/13/2024)   Depression (PHQ2-9)    PHQ-2 Score: 20  Alcohol Screen: Low Risk (12/12/2022)   Alcohol Screen    Last Alcohol Screening Score (AUDIT): 0  Housing: Low Risk (04/03/2024)   Epic    Unable to Pay for Housing in the Last Year: No    Number of Times Moved in the Last Year: 0    Homeless in the Last Year: No  Utilities: Not At Risk (04/03/2024)   Epic    Threatened with loss of utilities: No  Health Literacy: Adequate Health Literacy (02/15/2023)   B1300 Health Literacy    Frequency of need for help with medical instructions: Never    Outpatient Medications Prior to Visit  Medication Sig Dispense Refill   acyclovir  ointment (ZOVIRAX ) 5 % Apply 1 Application topically every 4 (four)  hours as needed. 30 g 0   albuterol  (VENTOLIN  HFA) 108 (90 Base) MCG/ACT inhaler Inhale 2 puffs into the lungs every 6 (six) hours as needed for wheezing or shortness of breath. 8 g  2   ALPRAZolam  (XANAX ) 0.25 MG tablet Take 1 tablet (0.25 mg total) by mouth at bedtime as needed for anxiety. 10 tablet 0   Ascorbic Acid (VITAMIN C) 1000 MG tablet Take 1,000 mg by mouth daily.     aspirin EC 81 MG tablet Take 81 mg by mouth daily. Swallow whole.     buPROPion  (WELLBUTRIN  XL) 300 MG 24 hr tablet Take 1 tablet (300 mg total) by mouth daily. 90 tablet 0   Cholecalciferol (D3 PO) Take 2 tablets by mouth daily.     clobetasol  (TEMOVATE ) 0.05 % external solution Apply 1 Application topically 2 (two) times daily. 50 mL 0   Clobetasol  Propionate 0.05 % shampoo Apply to dry scalp - let sit 10 min and then add water, later and rinse --- 2-3 x weekly. 118 mL 2   dicyclomine  (BENTYL ) 20 MG tablet TAKE 1 TABLET(20 MG) BY MOUTH TWICE DAILY FOR UP TO 15 DAYS AS NEEDED FOR SPASMS 30 tablet 0   docusate sodium  (COLACE) 100 MG capsule Take 1 capsule (100 mg total) by mouth 2 (two) times daily. 30 capsule 0   DULoxetine  (CYMBALTA ) 60 MG capsule Take 1 capsule (60 mg total) by mouth 2 (two) times daily. 180 capsule 0   Fluticasone -Umeclidin-Vilant (TRELEGY ELLIPTA ) 100-62.5-25 MCG/ACT AEPB Inhale 1 puff into the lungs daily. 60 each 11   furosemide  (LASIX ) 20 MG tablet Take 1 tablet (20 mg total) by mouth daily as needed for edema. 90 tablet 2   HYDROcodone -acetaminophen  (NORCO/VICODIN) 5-325 MG tablet Take 1-2 tablets by mouth every 4 (four) hours as needed for moderate pain (pain score 4-6). 30 tablet 0   hydrocortisone  2.5 % ointment Apply to affected areas on face until symptoms improve. 30 g 1   hydrOXYzine  (VISTARIL ) 25 MG capsule Take 1 capsule (25 mg total) by mouth every 8 (eight) hours as needed. 90 capsule 2   levothyroxine  (SYNTHROID ) 75 MCG tablet TAKE 1 TABLET(75 MCG) BY MOUTH DAILY 90  tablet 1   lisinopril  (ZESTRIL ) 2.5 MG tablet Take 2.5 mg by mouth daily.     metoprolol  tartrate (LOPRESSOR ) 50 MG tablet TAKE 1 TABLET(50 MG) BY MOUTH TWICE DAILY WITH MEALS 180 tablet 1   montelukast  (SINGULAIR ) 10 MG tablet Take 1 tablet (10 mg total) by mouth daily as needed (allergies). (Patient taking differently: Take 10 mg by mouth at bedtime.) 90 tablet 2   mupirocin  ointment (BACTROBAN ) 2 % Apply 1 Application topically 2 (two) times daily. 22 g 1   naloxone (NARCAN) nasal spray 4 mg/0.1 mL Place 1 spray into the nose once. In case of opioid overdose.     nitroGLYCERIN  (NITROSTAT ) 0.4 MG SL tablet DISSOLVE ONE TABLET UNDER TONGUE AS NEEDED FOR CHEST PAIN EVERY 5 MINUTES FOR UP TO 3 DOSES 25 tablet 1   pantoprazole  (PROTONIX ) 40 MG tablet Take 1 tablet (40 mg total) by mouth daily. 30 tablet 3   REPATHA SURECLICK 140 MG/ML SOAJ Inject 140 mg into the skin every 14 (fourteen) days.     tirzepatide  (MOUNJARO ) 12.5 MG/0.5ML Pen Inject 12.5 mg into the skin once a week. 2 mL 3   tiZANidine  (ZANAFLEX ) 4 MG tablet Take 4 mg by mouth at bedtime as needed for muscle spasms.     tretinoin  (RETIN-A ) 0.025 % gel Apply 1 Application topically at bedtime. 45 g 0   valACYclovir  (VALTREX ) 1000 MG tablet Take one table daily 90 tablet 1   No facility-administered medications prior to visit.  Allergies[1]  Review of Systems  Constitutional:  Positive for fatigue. Negative for chills, diaphoresis and fever.  HENT:  Positive for congestion, sinus pressure and sinus pain. Negative for ear pain and sore throat.   Eyes: Negative.   Respiratory:  Positive for cough. Negative for shortness of breath.   Cardiovascular:  Negative for chest pain.  Gastrointestinal:  Negative for abdominal pain, constipation, nausea and vomiting.  Endocrine: Negative.   Genitourinary:  Negative for dysuria.  Musculoskeletal:  Negative for arthralgias.  Allergic/Immunologic: Negative.   Neurological:   Positive for headaches. Negative for weakness.  Psychiatric/Behavioral:  Negative for dysphoric mood. The patient is not nervous/anxious.        Objective:        06/06/2024    3:36 PM 05/23/2024    3:50 PM 05/23/2024    3:40 PM  Vitals with BMI  Height 5' 5    Weight 153 lbs    BMI 25.46    Systolic 158 132 866  Diastolic 80 81 70  Pulse 74 79 86    No data found.   Physical Exam  Health Maintenance Due  Topic Date Due   Medicare Annual Wellness (AWV)  04/29/2023    There are no preventive care reminders to display for this patient.   Lab Results  Component Value Date   TSH 2.580 01/01/2024   Lab Results  Component Value Date   WBC 10.6 (H) 04/01/2024   HGB 12.0 04/01/2024   HCT 37.3 04/01/2024   MCV 100.0 04/01/2024   PLT 355 04/01/2024   Lab Results  Component Value Date   NA 131 (L) 04/01/2024   K 3.9 04/01/2024   CO2 29 04/01/2024   GLUCOSE 85 04/01/2024   BUN 17 04/01/2024   CREATININE 1.11 (H) 04/01/2024   BILITOT 0.3 01/01/2024   ALKPHOS 129 (H) 01/01/2024   AST 22 01/01/2024   ALT 16 01/01/2024   PROT 7.3 01/01/2024   ALBUMIN 4.4 01/01/2024   CALCIUM 9.4 04/01/2024   ANIONGAP 7 04/01/2024   EGFR 48 (L) 01/01/2024   Lab Results  Component Value Date   CHOL 215 (H) 01/01/2024   Lab Results  Component Value Date   HDL 55 01/01/2024   Lab Results  Component Value Date   LDLCALC 139 (H) 01/01/2024   Lab Results  Component Value Date   TRIG 119 01/01/2024   Lab Results  Component Value Date   CHOLHDL 3.9 01/01/2024   Lab Results  Component Value Date   HGBA1C 5.3 04/24/2024        Results for orders placed or performed in visit on 05/23/24  Surgical pathology ([facility])   Collection Time: 05/23/24 12:00 AM  Result Value Ref Range   SURGICAL PATHOLOGY      SURGICAL PATHOLOGY [facility] [address], [address] [place], [place] [address] Telephone [phone] or [phone] Fax [phone]  REPORT OF SURGICAL PATHOLOGY   Accession #: [id number] Patient Name: COLBIE, SLIKER Visit # : [id number]  MRN: [id number] Physician: Charlanne Groom DOB/Age [birth year] (Age: 37) Gender: F Collected Date: 05/23/2024 Received Date: 05/27/2024  FINAL DIAGNOSIS       1. Surgical [P], colon, splenic flexure :       - COLONIC MUCOSA WITH CHANGES CONSISTENT WITH ISCHEMIC COLITIS      - NEGATIVE FOR DYSPLASIA OR MALIGNANCY       DATE SIGNED OUT: 05/28/2024 ELECTRONIC SIGNATURE : Frutoso M.D., Nupur, Pathologist, Electronic Signature  MICROSCOPIC DESCRIPTION  CASE COMMENTS STAINS USED IN DIAGNOSIS: H&E    CLINICAL HISTORY  SPECIMEN(S) OBTAINED 1. Surgical [P], Colon, Splenic Flexure  SPECIMEN COMMENTS: 1. History of colonic polyps SPECIMEN CLINICAL INFORMATION: 1. R/O ischemic colitis    Gr oss Description 1. Received in formalin are tan, soft tissue fragments that are submitted in toto. Number: multiple, Size: 0.2 cm smallest to 0.4 cm largest, (1B) ( TT )        Report signed out from the following location(s) [facility] [address] [place], [place] [address] CLIA #: [id number]  [facility] [address] [place], [place] [address] CLIA #: [id number]      Assessment & Plan:   Assessment & Plan Bronchiectasis with acute exacerbation (HCC)  Orders:   amoxicillin -clavulanate (AUGMENTIN ) 875-125 MG tablet; Take 1 tablet by mouth 2 (two) times daily for 7 days.   guaiFENesin  (MUCINEX ) 600 MG 12 hr tablet; Take 1 tablet (600 mg total) by mouth 2 (two) times daily as needed.     Body mass index is 25.46 kg/m.SABRA  Assessment and Plan           No orders of the defined types were placed in this encounter.   No orders of the defined types were placed in this encounter.    Follow-up: No follow-ups on file.  An After Visit Summary was printed and given to the patient.  Harrie CHRISTELLA Cedar, FNP [name] [phone]       [1] Allergies Allergen Reactions   Levofloxacin Hives, Other (See Comments), Shortness Of Breath and Swelling    Tolerates Ciprofloxacin   RASH IN MOUTH   (can take IV route)   Bactrim  [Sulfamethoxazole -Trimethoprim ] Hives and Itching   Clarithromycin  Other (See Comments)    GI upset Other reaction(s): GI Upset (intolerance) GI upset -can take but does cause mild upset    Lyrica [Pregabalin] Other (See Comments)   Nexletol [Bempedoic Acid] Other (See Comments)    Muscle pain   Statins Other (See Comments)    Myalgias   Zetia  [Ezetimibe ] Other (See Comments)    Muscle pain

## 2024-06-07 DIAGNOSIS — J0110 Acute frontal sinusitis, unspecified: Secondary | ICD-10-CM | POA: Insufficient documentation

## 2024-06-07 DIAGNOSIS — J471 Bronchiectasis with (acute) exacerbation: Secondary | ICD-10-CM | POA: Insufficient documentation

## 2024-06-07 NOTE — Assessment & Plan Note (Signed)
 Sinusitis Symptoms and inflammation consistent with sinusitis, likely viral or bacterial. - Recommend Flonase  for sinus relief. - Advise on humidified air and neti pot. - Encourage natural supplements like vitamin C, zinc , and elderberry.

## 2024-06-07 NOTE — Assessment & Plan Note (Signed)
 Hypertension Blood pressure elevated at 140/84. Possible non-compliance with medication. Headache may be related. - Advise monitoring blood pressure at home. - Instruct to take metoprolol  (previously discontinued due to dizziness and low blood pressure) if blood pressure reaches 160 mmHg systolic. - Encourage adequate hydration and rest.

## 2024-06-07 NOTE — Assessment & Plan Note (Addendum)
 Bronchiectasis exacerbation Increased cough and sputum suggest bacterial infection, likely due to bronchiectasis. Non-compliance with inhalers noted. - Prescribe Augmentin  875 mg/125 mg twice daily for 7 days. - Prescribe Mucinex  500 mg as needed. - Refill albuterol  inhaler. - Encourage use of inhalers as prescribed. - Advise on humidified air, neti pot, and natural supplements like vitamin C, zinc , and elderberry. Orders:   amoxicillin -clavulanate (AUGMENTIN ) 875-125 MG tablet; Take 1 tablet by mouth 2 (two) times daily for 7 days.   guaiFENesin  (MUCINEX ) 600 MG 12 hr tablet; Take 1 tablet (600 mg total) by mouth 2 (two) times daily as needed.   POC COVID-19   POC Influenza A&B (Binax test)

## 2024-06-17 ENCOUNTER — Other Ambulatory Visit: Payer: Self-pay | Admitting: Family Medicine

## 2024-06-17 DIAGNOSIS — B370 Candidal stomatitis: Secondary | ICD-10-CM

## 2024-06-18 ENCOUNTER — Other Ambulatory Visit: Payer: Self-pay | Admitting: Family Medicine

## 2024-06-24 ENCOUNTER — Ambulatory Visit: Admitting: Urology

## 2024-07-02 ENCOUNTER — Encounter: Payer: Self-pay | Admitting: Family Medicine

## 2024-07-02 ENCOUNTER — Ambulatory Visit: Admitting: Family Medicine

## 2024-07-02 ENCOUNTER — Ambulatory Visit: Payer: Self-pay

## 2024-07-02 VITALS — BP 138/78 | HR 85 | Temp 98.0°F | Resp 18 | Ht 65.0 in | Wt 153.5 lb

## 2024-07-02 DIAGNOSIS — I119 Hypertensive heart disease without heart failure: Secondary | ICD-10-CM | POA: Diagnosis not present

## 2024-07-02 DIAGNOSIS — N1831 Chronic kidney disease, stage 3a: Secondary | ICD-10-CM | POA: Insufficient documentation

## 2024-07-02 DIAGNOSIS — I110 Hypertensive heart disease with heart failure: Secondary | ICD-10-CM | POA: Diagnosis not present

## 2024-07-02 DIAGNOSIS — M25552 Pain in left hip: Secondary | ICD-10-CM | POA: Insufficient documentation

## 2024-07-02 DIAGNOSIS — E782 Mixed hyperlipidemia: Secondary | ICD-10-CM

## 2024-07-02 DIAGNOSIS — B0229 Other postherpetic nervous system involvement: Secondary | ICD-10-CM

## 2024-07-02 MED ORDER — JOURNAVX 50 MG PO TABS: 50.0000 mg | ORAL_TABLET | Freq: Every day | ORAL | 0 refills | Status: DC

## 2024-07-02 NOTE — Telephone Encounter (Signed)
 FYI Only or Action Required?: FYI only for provider: appointment scheduled on 07/02/24.  Patient was last seen in primary care on 06/06/2024 by Teressa Harrie HERO, FNP.  Called Nurse Triage reporting Hip Pain.  Symptoms began several weeks ago.  Interventions attempted: OTC medications: Ibuprofen and Ice/heat application.  Symptoms are: gradually worsening.  Triage Disposition: See HCP Within 4 Hours (Or PCP Triage)  Patient/caregiver understands and will follow disposition?: Yes  Reason for Disposition  [1] SEVERE pain (e.g., excruciating, unable to do any normal activities) AND [2] not improved after 2 hours of pain medicine  Answer Assessment - Initial Assessment Questions Patient states that she started feeling left hip pain about 2 weeks ago that has worsened since Sunday. She reports the pain as 11/10 and states that she is having trouble sleeping and performing daily activities. She has been taking Ibuprofen and applying ice/heat with little relief. Office visit advised.   1. LOCATION and RADIATION: Where is the pain located? Does the pain spread (shoot) anywhere else?     Left hip, down side of leg sometimes  2. QUALITY: What does the pain feel like?  (e.g., sharp, dull, aching, burning)     Sharp throbbing aching pain  3. SEVERITY: How bad is the pain? What does it keep you from doing?   (Scale 1-10; or mild, moderate, severe)     11 /10  4. ONSET: When did the pain start? Does it come and go, or is it there all the time?     About 2 weeks ago  5. WORK OR EXERCISE: Has there been any recent work or exercise that involved this part of the body?      No  6. CAUSE: What do you think is causing the hip pain?      Not sure  7. AGGRAVATING FACTORS: What makes the hip pain worse? (e.g., walking, climbing stairs, running)     Walking, climbing stairs  8. OTHER SYMPTOMS: Do you have any other symptoms? (e.g., back pain, pain shooting down leg,  fever,  rash)     Denies any other symptoms  Protocols used: Hip Pain-A-AH  Copied from CRM #[id number]. Topic: Clinical - Red Word Triage >> Jul 02, 2024  9:30 AM Antwanette L wrote: Red Word that prompted transfer to Nurse Triage:  The patient reports pain in the left hip and states that the discomfort is preventing sleep

## 2024-07-02 NOTE — Assessment & Plan Note (Addendum)
 Acute on chronic left hip pain with previous hip xray on 12/15/23. - Order for MRI of the left hip - Order for pain medication - order for referral if Dr. Sherre is unable to provide steroid hip injection. Orders:   MR HIP LEFT W WO CONTRAST; Future   Suzetrigine  (JOURNAVX ) 50 MG TABS; Take 50 mg by mouth daily.   AMB referral to orthopedics

## 2024-07-02 NOTE — Assessment & Plan Note (Signed)
 Stable Shingles (herpes zoster) of scalp Persistent shingles lesions on the scalp with ongoing discomfort. Managed with topical treatments by a dermatologist.  - Consider alternative treatments like cryotherapy as discussed previously - Continue treatment per specialist - see photo

## 2024-07-02 NOTE — Assessment & Plan Note (Signed)
 Well controlled.  No changes to medicines. Metoprolol  tartrate 50 mg twice daily, on lisinopril  5 mg daily. She also takes lasix  20 mg as needed for edema. - labs drawn today - Continue to work on eating a healthy diet and exercise.

## 2024-07-02 NOTE — Assessment & Plan Note (Signed)
 Last GFR 48, decreased from 51. Patient is concerned about reduced kidney function. - Labs drawn today

## 2024-07-02 NOTE — Assessment & Plan Note (Addendum)
 Well controlled.  No changes to medicines. Metoprolol  tartrate 50 mg twice daily, on lisinopril  5 mg daily. She also takes lasix  20 mg as needed for edema. - labs drawn today - Continue to work on eating a healthy diet and exercise.

## 2024-07-02 NOTE — Assessment & Plan Note (Addendum)
 Last GFR 48, decreased from 51. Patient is concerned about reduced kidney function. - Labs drawn today Orders:   Comprehensive metabolic panel with GFR

## 2024-07-02 NOTE — Assessment & Plan Note (Addendum)
 Elevated cholesterol levels with LDL 205 mg/dL, triglycerides 746 mg/dL, total cholesterol 699 mg/dL. Repatha was temporarily discontinued due to surgery, likely causing increased levels. - labs drawn today to recheck cholesterol. Orders:   Lipid Panel

## 2024-07-02 NOTE — Progress Notes (Signed)
 "  Acute Office Visit  Subjective:    Patient ID: Courtney [name], female    DOB: [birth year], 79 y.o.   MRN: [id number]  Chief Complaint  Patient presents with   Hip Pain    HPI: Courtney [name] is a 79 year old female with left hip pain.   Left Hip Pain - Previous xray 12/15/23  - Currently taking Motrin because Courtney [name] couldn't stand the pain - Discontinued Celebrex due to kidney function decline - Uses heat as needed - Trouble walking and getting around, effecting ADL's - Wants to try a new medication for pain that is not an opioid   Hypertension - Taking metoprolol  tartrate 50 mg TWICE A DAY and lisinopril  5 mg once daily. - Taking lasix  20 mg as needed for edema - Taking aspirin 81 mg once daily - Denies chest pain and shortness of breath.  Hyperlipidemia - Taking Repatha - 140 mg Q 2 weeks for cholesterol management - Denies side effects  CKD - Needs labs to check kidney function - Discontinued celebrex and has been taking motrin due to the pain in her left hip  Post herpetic neuralgia - Reports continued scabs on her scalp, but stable - managed by specialist     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   Followed in Pulmonary clinic/ [facility]/ [place]    Cardiac dysrhythmia    Chronic idiopathic constipation    Colitis, acute 02/08/2021   Coronary artery disease    Depression, major, recurrent, moderate (HCC)    Fibromyalgia    Headache    Heart murmur    History of bladder infections    History of COVID-19 2020   Hyperlipidemia    Hypertension    Hypothyroidism    Knee pain, bilateral    Melena 02/08/2021   Osteoarthritis    Pneumonia    x several   Pre-diabetes 03/2021   Primary insomnia    Recovering alcoholic (HCC)    SVT (supraventricular tachycardia)    UTI (urinary tract  infection)    Varicose veins    Vitamin B12 deficiency     Past Surgical History:  Procedure Laterality Date   ABDOMINAL HYSTERECTOMY  1991   APPENDECTOMY     BACK SURGERY     had 4 surgeries. Have rods placed in back - last 07/2021   BREAST BIOPSY Left    x2   BREAST ENHANCEMENT SURGERY  2006   CARDIAC CATHETERIZATION  12/22/2020   stents placed   CHOLECYSTECTOMY  12/2019   removed lap band.    COLONOSCOPY  12/01/2016   Moderate predominantly sigmod diverticulosis. Otherwise grossly normal colonoscopy   EYE SURGERY     IOL- bilateral - [place]   KNEE ARTHROSCOPY Left    lap band surgery  1981   LUMBAR FUSION  07/29/2015   posterior level one   removal of cervical disc fragments  10/2007   pt. denies    THORACIC LAMINECTOMY FOR SPINAL CORD STIMULATOR N/A 04/03/2024   Procedure: THORACIC SIX LAMINECTOMY FOR SPINAL CORD STIMULATOR;  Surgeon: Mavis Purchase, MD;  Location: [facility] OR;  Service: Neurosurgery;  Laterality: N/A;  SCS PER WITH PADDLE LEAD, T6   TUBAL LIGATION  1970    Family History  Problem Relation Age  of Onset   Heart disease Mother    Heart disease Father    Diabetes Sister    Other Sister        BRAIN TUMOR   Heart disease Brother    Heart disease Brother    Heart disease Brother    Heart disease Brother    Heart disease Brother    Colon cancer Maternal Aunt    Colon cancer Cousin    Esophageal cancer Neg Hx    Rectal cancer Neg Hx    Stomach cancer Neg Hx     Social History   Socioeconomic History   Marital status: Widowed    Spouse name: Not on file   Number of children: 2   Years of education: Not on file   Highest education level: Not on file  Occupational History   Occupation: retired    Comment: nurse  Tobacco Use   Smoking status: Never   Smokeless tobacco: Never  Vaping Use   Vaping status: Never Used  Substance and Sexual Activity   Alcohol use: No    Alcohol/week: 0.0 standard drinks of alcohol    Comment: recovery x 20 yrs    Drug use: No   Sexual activity: Not Currently    Birth control/protection: None, Surgical    Comment: hysterectomy  Other Topics Concern   Not on file  Social History Narrative   Not on file   Social Drivers of Health   Tobacco Use: Low Risk (07/02/2024)   Patient History    Smoking Tobacco Use: Never    Smokeless Tobacco Use: Never    Passive Exposure: Not on file  Financial Resource Strain: Low Risk (12/12/2022)   Overall Financial Resource Strain (CARDIA)    Difficulty of Paying Living Expenses: Not hard at all  Food Insecurity: No Food Insecurity (04/03/2024)   Epic    Worried About Programme Researcher, Broadcasting/film/video in the Last Year: Never true    Ran Out of Food in the Last Year: Never true  Transportation Needs: No Transportation Needs (04/03/2024)   Epic    Lack of Transportation (Medical): No    Lack of Transportation (Non-Medical): No  Physical Activity: Inactive (12/12/2022)   Exercise Vital Sign    Days of Exercise per Week: 0 days    Minutes of Exercise per Session: 0 min  Stress: No Stress Concern Present (12/12/2022)   [employer] of Occupational Health - Occupational Stress Questionnaire    Feeling of Stress : Not at all  Social Connections: Moderately Isolated (04/03/2024)   Social Connection and Isolation Panel    Frequency of Communication with Friends and Family: Three times a week    Frequency of Social Gatherings with Friends and Family: Three times a week    Attends Religious Services: More than 4 times per year    Active Member of Clubs or Organizations: No    Attends Banker Meetings: Never    Marital Status: Widowed  Intimate Partner Violence: Not At Risk (04/03/2024)   Epic    Fear of Current or Ex-Partner: No    Emotionally Abused: No    Physically Abused: No    Sexually Abused: No  Depression (PHQ2-9): High Risk (02/13/2024)   Depression (PHQ2-9)    PHQ-2 Score: 20  Alcohol Screen: Low Risk (12/12/2022)   Alcohol Screen    Last  Alcohol Screening Score (AUDIT): 0  Housing: Low Risk (04/03/2024)   Epic    Unable to Pay for Housing in  the Last Year: No    Number of Times Moved in the Last Year: 0    Homeless in the Last Year: No  Utilities: Not At Risk (04/03/2024)   Epic    Threatened with loss of utilities: No  Health Literacy: Adequate Health Literacy (02/15/2023)   B1300 Health Literacy    Frequency of need for help with medical instructions: Never    Outpatient Medications Prior to Visit  Medication Sig Dispense Refill   acyclovir  ointment (ZOVIRAX ) 5 % Apply 1 Application topically every 4 (four) hours as needed. 30 g 0   albuterol  (VENTOLIN  HFA) 108 (90 Base) MCG/ACT inhaler Inhale 2 puffs into the lungs every 6 (six) hours as needed for wheezing or shortness of breath. 8 g 2   ALPRAZolam  (XANAX ) 0.25 MG tablet Take 1 tablet (0.25 mg total) by mouth at bedtime as needed for anxiety. 10 tablet 0   Ascorbic Acid (VITAMIN C) 1000 MG tablet Take 1,000 mg by mouth daily.     aspirin EC 81 MG tablet Take 81 mg by mouth daily. Swallow whole.     buPROPion  (WELLBUTRIN  XL) 300 MG 24 hr tablet Take 1 tablet (300 mg total) by mouth daily. 90 tablet 0   Cholecalciferol (D3 PO) Take 2 tablets by mouth daily.     clobetasol  (TEMOVATE ) 0.05 % external solution Apply 1 Application topically 2 (two) times daily. 50 mL 0   Clobetasol  Propionate 0.05 % shampoo Apply to dry scalp - let sit 10 min and then add water, later and rinse --- 2-3 x weekly. 118 mL 2   dicyclomine  (BENTYL ) 20 MG tablet TAKE 1 TABLET(20 MG) BY MOUTH TWICE DAILY FOR UP TO 15 DAYS AS NEEDED FOR SPASMS 30 tablet 0   docusate sodium  (COLACE) 100 MG capsule Take 1 capsule (100 mg total) by mouth 2 (two) times daily. 30 capsule 0   DULoxetine  (CYMBALTA ) 60 MG capsule Take 1 capsule (60 mg total) by mouth 2 (two) times daily. 180 capsule 0   fluconazole  (DIFLUCAN ) 150 MG tablet TAKE 1 TABLET(150 MG) BY MOUTH DAILY FOR 1 DOSE 1 tablet 0    Fluticasone -Umeclidin-Vilant (TRELEGY ELLIPTA ) 100-62.5-25 MCG/ACT AEPB Inhale 1 puff into the lungs daily. 60 each 11   furosemide  (LASIX ) 20 MG tablet Take 1 tablet (20 mg total) by mouth daily as needed for edema. 90 tablet 2   guaiFENesin  (MUCINEX ) 600 MG 12 hr tablet Take 1 tablet (600 mg total) by mouth 2 (two) times daily as needed. 60 tablet 0   HYDROcodone -acetaminophen  (NORCO/VICODIN) 5-325 MG tablet Take 1-2 tablets by mouth every 4 (four) hours as needed for moderate pain (pain score 4-6). 30 tablet 0   hydrocortisone  2.5 % ointment Apply to affected areas on face until symptoms improve. 30 g 1   hydrOXYzine  (VISTARIL ) 25 MG capsule Take 1 capsule (25 mg total) by mouth every 8 (eight) hours as needed. 90 capsule 2   levothyroxine  (SYNTHROID ) 75 MCG tablet TAKE 1 TABLET(75 MCG) BY MOUTH DAILY 90 tablet 1   lisinopril  (ZESTRIL ) 2.5 MG tablet Take 2.5 mg by mouth daily.     metoprolol  tartrate (LOPRESSOR ) 50 MG tablet TAKE 1 TABLET(50 MG) BY MOUTH TWICE DAILY WITH MEALS 180 tablet 1   montelukast  (SINGULAIR ) 10 MG tablet Take 1 tablet (10 mg total) by mouth daily as needed (allergies). (Patient taking differently: Take 10 mg by mouth at bedtime.) 90 tablet 2   mupirocin  ointment (BACTROBAN ) 2 % Apply 1 Application topically 2 (two)  times daily. 22 g 1   naloxone (NARCAN) nasal spray 4 mg/0.1 mL Place 1 spray into the nose once. In case of opioid overdose.     nitroGLYCERIN  (NITROSTAT ) 0.4 MG SL tablet DISSOLVE ONE TABLET UNDER TONGUE AS NEEDED FOR CHEST PAIN EVERY 5 MINUTES FOR UP TO 3 DOSES 25 tablet 1   pantoprazole  (PROTONIX ) 40 MG tablet Take 1 tablet (40 mg total) by mouth daily. 30 tablet 3   REPATHA SURECLICK 140 MG/ML SOAJ Inject 140 mg into the skin every 14 (fourteen) days.     tirzepatide  (MOUNJARO ) 12.5 MG/0.5ML Pen Inject 12.5 mg into the skin once a week. 2 mL 3   tiZANidine  (ZANAFLEX ) 4 MG tablet Take 4 mg by mouth at bedtime as needed for muscle spasms.     tretinoin   (RETIN-A ) 0.025 % gel Apply 1 Application topically at bedtime. 45 g 0   valACYclovir  (VALTREX ) 1000 MG tablet Take one table daily 90 tablet 1   No facility-administered medications prior to visit.    Allergies[1]  Review of Systems  Constitutional:  Negative for chills, diaphoresis, fatigue and fever.  HENT:  Negative for congestion, ear pain and sinus pain.   Eyes: Negative.   Respiratory:  Negative for cough and shortness of breath.   Cardiovascular:  Negative for chest pain.  Gastrointestinal:  Negative for abdominal pain, constipation, nausea and vomiting.  Endocrine: Negative.   Genitourinary:  Negative for dysuria.  Musculoskeletal:  Positive for arthralgias (left hip pain).  Skin: Negative.   Allergic/Immunologic: Negative.   Neurological:  Negative for dizziness, weakness, light-headedness and headaches.  Psychiatric/Behavioral:  Negative for dysphoric mood. The patient is not nervous/anxious.        Objective:        07/02/2024    1:30 PM 06/06/2024    4:01 PM 06/06/2024    3:36 PM  Vitals with BMI  Height 5' 5  5' 5  Weight 153 lbs 8 oz  153 lbs  BMI 25.54  25.46  Systolic 138 140 841  Diastolic 78 82 80  Pulse 85  74    No data found.   Physical Exam Vitals reviewed.  Constitutional:      General: Courtney [name] is not in acute distress.    Appearance: Normal appearance. Courtney [name] is not ill-appearing.  Eyes:     Conjunctiva/sclera: Conjunctivae normal.  Cardiovascular:     Rate and Rhythm: Normal rate and regular rhythm.     Heart sounds: Normal heart sounds. No murmur heard. Pulmonary:     Effort: Pulmonary effort is normal. No respiratory distress.     Breath sounds: Normal breath sounds. No wheezing.  Abdominal:     Palpations: Abdomen is soft.  Musculoskeletal:     Right hip: Normal.     Left hip: Tenderness present. Decreased range of motion.  Neurological:     Mental Status: Courtney [name] is alert. Mental status is at baseline.  Psychiatric:        Mood and  Affect: Mood normal.        Behavior: Behavior normal.     Health Maintenance Due  Topic Date Due   Medicare Annual Wellness (AWV)  04/29/2023    There are no preventive care reminders to display for this patient.   Lab Results  Component Value Date   TSH 2.580 01/01/2024   Lab Results  Component Value Date   WBC 10.6 (H) 04/01/2024   HGB 12.0 04/01/2024   HCT 37.3 04/01/2024   MCV 100.0  04/01/2024   PLT 355 04/01/2024   Lab Results  Component Value Date   NA 131 (L) 04/01/2024   K 3.9 04/01/2024   CO2 29 04/01/2024   GLUCOSE 85 04/01/2024   BUN 17 04/01/2024   CREATININE 1.11 (H) 04/01/2024   BILITOT 0.3 01/01/2024   ALKPHOS 129 (H) 01/01/2024   AST 22 01/01/2024   ALT 16 01/01/2024   PROT 7.3 01/01/2024   ALBUMIN 4.4 01/01/2024   CALCIUM 9.4 04/01/2024   ANIONGAP 7 04/01/2024   EGFR 48 (L) 01/01/2024   Lab Results  Component Value Date   CHOL 215 (H) 01/01/2024   Lab Results  Component Value Date   HDL 55 01/01/2024   Lab Results  Component Value Date   LDLCALC 139 (H) 01/01/2024   Lab Results  Component Value Date   TRIG 119 01/01/2024   Lab Results  Component Value Date   CHOLHDL 3.9 01/01/2024   Lab Results  Component Value Date   HGBA1C 5.3 04/24/2024        Results for orders placed or performed in visit on 06/06/24  POC COVID-19   Collection Time: 06/06/24  4:10 PM  Result Value Ref Range   SARS Coronavirus 2 Ag Negative Negative  POC Influenza A&B (Binax test)   Collection Time: 06/06/24  4:10 PM  Result Value Ref Range   Influenza A, POC Negative Negative   Influenza B, POC Negative Negative     Assessment & Plan:   Assessment & Plan Left hip pain Acute on chronic left hip pain with previous hip xray on 12/15/23. - Order for MRI of the left hip - Order for pain medication - order for referral if Dr. Sherre is unable to provide steroid hip injection. Orders:   MR HIP LEFT W WO CONTRAST; Future   Suzetrigine  (JOURNAVX ) 50  MG TABS; Take 50 mg by mouth daily.   AMB referral to orthopedics  Mixed hyperlipidemia Elevated cholesterol levels with LDL 205 mg/dL, triglycerides 746 mg/dL, total cholesterol 699 mg/dL. Repatha was temporarily discontinued due to surgery, likely causing increased levels. - labs drawn today to recheck cholesterol. Orders:   Lipid Panel   CKD stage 3a, GFR 45-59 ml/min (HCC) Last GFR 48, decreased from 51. Patient is concerned about reduced kidney function. - Labs drawn today Orders:   Comprehensive metabolic panel with GFR  Hypertensive heart disease with heart failure (HCC) Well controlled.  No changes to medicines. Metoprolol  tartrate 50 mg twice daily, on lisinopril  5 mg daily. Courtney [name] also takes lasix  20 mg as needed for edema. - labs drawn today - Continue to work on eating a healthy diet and exercise.    Post herpetic neuralgia Stable Shingles (herpes zoster) of scalp Persistent shingles lesions on the scalp with ongoing discomfort. Managed with topical treatments by a dermatologist.  - Consider alternative treatments like cryotherapy as discussed previously - Continue treatment per specialist - see photo    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 Well controlled.  No changes to medicines. Metoprolol  tartrate 50 mg twice daily, on lisinopril  5 mg daily. Courtney [name] also takes lasix  20 mg as needed for edema. - labs drawn today - Continue to work on eating a healthy diet and exercise.        Orders Placed This Encounter  Procedures   MR HIP LEFT W WO CONTRAST   Comprehensive metabolic panel with GFR   Lipid Panel   AMB referral to orthopedics     Follow-up: Return for Appointment  with [name] for hip brusisitis steroid injection..  An After Visit Summary was printed and given to the patient.  Harrie Cedar, FNP [name] [phone]       [1]  Allergies Allergen Reactions   Levofloxacin Hives, Other (See Comments), Shortness Of Breath and Swelling     Tolerates Ciprofloxacin   RASH IN MOUTH   (can take IV route)   Bactrim  [Sulfamethoxazole -Trimethoprim ] Hives and Itching   Clarithromycin  Other (See Comments)    GI upset Other reaction(s): GI Upset (intolerance) GI upset -can take but does cause mild upset    Lyrica [Pregabalin] Other (See Comments)   Nexletol [Bempedoic Acid] Other (See Comments)    Muscle pain   Statins Other (See Comments)    Myalgias   Zetia  [Ezetimibe ] Other (See Comments)    Muscle pain   "

## 2024-07-03 ENCOUNTER — Ambulatory Visit: Admitting: Physician Assistant

## 2024-07-03 ENCOUNTER — Encounter: Payer: Self-pay | Admitting: Physician Assistant

## 2024-07-03 ENCOUNTER — Telehealth: Payer: Self-pay

## 2024-07-03 ENCOUNTER — Other Ambulatory Visit (HOSPITAL_COMMUNITY): Payer: Self-pay

## 2024-07-03 VITALS — BP 110/72

## 2024-07-03 DIAGNOSIS — L309 Dermatitis, unspecified: Secondary | ICD-10-CM | POA: Diagnosis not present

## 2024-07-03 LAB — COMPREHENSIVE METABOLIC PANEL WITH GFR
ALT: 19 IU/L (ref 0–32)
AST: 19 IU/L (ref 0–40)
Albumin: 4.6 g/dL (ref 3.8–4.8)
Alkaline Phosphatase: 128 IU/L (ref 49–135)
BUN/Creatinine Ratio: 19 (ref 12–28)
BUN: 27 mg/dL (ref 8–27)
Bilirubin Total: <0.2 mg/dL (ref 0.0–1.2)
CO2: 22 mmol/L (ref 20–29)
Calcium: 10.2 mg/dL (ref 8.7–10.3)
Chloride: 98 mmol/L (ref 96–106)
Creatinine, Ser: 1.43 mg/dL — ABNORMAL HIGH (ref 0.57–1.00)
Globulin, Total: 2.9 g/dL (ref 1.5–4.5)
Glucose: 69 mg/dL — ABNORMAL LOW (ref 70–99)
Potassium: 4.8 mmol/L (ref 3.5–5.2)
Sodium: 135 mmol/L (ref 134–144)
Total Protein: 7.5 g/dL (ref 6.0–8.5)
eGFR: 38 mL/min/1.73 — ABNORMAL LOW

## 2024-07-03 LAB — LIPID PANEL
Chol/HDL Ratio: 5.2 ratio — ABNORMAL HIGH (ref 0.0–4.4)
Cholesterol, Total: 323 mg/dL — ABNORMAL HIGH (ref 100–199)
HDL: 62 mg/dL
LDL Chol Calc (NIH): 234 mg/dL — ABNORMAL HIGH (ref 0–99)
Triglycerides: 145 mg/dL (ref 0–149)
VLDL Cholesterol Cal: 27 mg/dL (ref 5–40)

## 2024-07-03 MED ORDER — MUPIROCIN 2 % EX OINT: 1.0000 | TOPICAL_OINTMENT | Freq: Two times a day (BID) | CUTANEOUS | 1 refills | Status: AC

## 2024-07-03 NOTE — Telephone Encounter (Signed)
 Pharmacy Patient Advocate Encounter   Received notification from [facility] that prior authorization for Journax is required/requested.   Insurance verification completed.   The patient is insured through [employer].   Per test claim:  Diclofenac ,Celecoxib,tramadol is preferred by the insurance.  If suggested medication is appropriate, Please send in a new RX and discontinue this one. If not, please advise as to why it's not appropriate so that we may request a Prior Authorization. Please note, some preferred medications may still require a PA.  If the suggested medications have not been trialed and there are no contraindications to their use, the PA will not be submitted, as it will not be approved. Archived Key:

## 2024-07-03 NOTE — Progress Notes (Signed)
" ° °  Follow-Up Visit   Subjective  Courtney [name] is a 79 y.o. female ESTABLISHED PATIENT who presents for the following: Post herpetic neuralgia follow up - She still has some lesions above her left brow and one in her scalp. She was using clobetasol  shampoo and solution but she could not tell that it helped so she stopped. She was also on Valtrex  but her PCP told her to stop because her GFR is high. She has also tried erythromycin gel and gentamicin gel prescribed by another dermatologist. Would like referral to someone who can help.    The following portions of the chart were reviewed this encounter and updated as appropriate: medications, allergies, medical history  Review of Systems:  No other skin or systemic complaints except as noted in HPI or Assessment and Plan.  Objective  Well appearing patient in no apparent distress; mood and affect are within normal limits.   A focused examination was performed of the following areas: Scalp, face  Relevant exam findings are noted in the Assessment and Plan.         Assessment & Plan   DERMATITIS UNSPECIFIED / NON-HEALING WOUND / S/P HERPES ZOSTER  - Mupirocin  as directed  - referral to wound center       DERMATITIS, UNSPECIFIED    Return if symptoms worsen or fail to improve.  I, Roseline Hutchinson, CMA, am acting as scribe for Taaliyah Delpriore K, PA-C .   Documentation: I have reviewed the above documentation for accuracy and completeness, and I agree with the above.  Ceilidh Torregrossa K, PA-C    "

## 2024-07-04 ENCOUNTER — Ambulatory Visit: Payer: Self-pay

## 2024-07-04 ENCOUNTER — Ambulatory Visit: Payer: Self-pay | Admitting: Family Medicine

## 2024-07-04 DIAGNOSIS — M25552 Pain in left hip: Secondary | ICD-10-CM

## 2024-07-04 NOTE — Telephone Encounter (Signed)
 Pt called to check the status of this request, reports severe pain in her left hip. Transferred to nurse triage.

## 2024-07-04 NOTE — Telephone Encounter (Signed)
 Pt called reporting she attempted to pick up new prescription for hip pain (JOURNAVX  50 MG TABS) however the pharmacy reported there was an insurance issue. Pt instructed to call her insurance company and that the provider will also be notified.   FYI Only or Action Required?: FYI only for provider: pt to call insurance company.  Patient was last seen in primary care on 07/02/2024 by Teressa Harrie HERO, FNP.  Called Nurse Triage reporting Medication Refill.  Triage Disposition: Information or Advice Only Call  Patient/caregiver understands and will follow disposition?: Yes   Copied from CRM #[id number]. Topic: Clinical - Red Word Triage >> Jul 04, 2024  1:19 PM Courtney [name] wrote: Red Word that prompted transfer to Nurse Triage: Severe pain in left side hip. Reason for Disposition  Health information question, no triage required and triager able to answer question  Answer Assessment - Initial Assessment Questions 1. REASON FOR CALL: What is the main reason for your call? or How can I best help you?     Pt called reporting she attempted to pick up new prescription for hip pain (JOURNAVX  50 MG TABS) however the pharmacy reported there was an insurance issue. Pt instructed to call her insurance company and that the provider will also be notified.   2. SYMPTOMS : Do you have any symptoms?      Hip pain for which she has recently been seen for.  Protocols used: Information Only Call - No Triage-A-AH

## 2024-07-06 MED ORDER — JOURNAVX 50 MG PO TABS: 50.0000 mg | ORAL_TABLET | Freq: Every day | ORAL | 0 refills | Status: AC

## 2024-07-08 ENCOUNTER — Telehealth: Payer: Self-pay | Admitting: Family Medicine

## 2024-07-08 NOTE — Telephone Encounter (Unsigned)
 Copied from CRM [id number]. Topic: General - Other >> Jul 08, 2024  9:39 AM [place] MATSU wrote: Reason for CRM:  Patient called in to ask if order for MRI was submitted I advised order was placed but I dont see it was approved as of yet. Per patient she would like an update on this.   MR HIP LEFT Courtney [name] CONTRAST (Order [id number]  May you please assist.

## 2024-07-09 ENCOUNTER — Other Ambulatory Visit: Payer: Self-pay | Admitting: Family Medicine

## 2024-07-09 ENCOUNTER — Other Ambulatory Visit (HOSPITAL_COMMUNITY): Payer: Self-pay | Admitting: Emergency Medicine

## 2024-07-09 DIAGNOSIS — M25552 Pain in left hip: Secondary | ICD-10-CM

## 2024-07-10 ENCOUNTER — Other Ambulatory Visit: Payer: Self-pay | Admitting: Family Medicine

## 2024-07-10 DIAGNOSIS — M25552 Pain in left hip: Secondary | ICD-10-CM

## 2024-07-17 ENCOUNTER — Other Ambulatory Visit (HOSPITAL_COMMUNITY): Payer: Self-pay | Admitting: Emergency Medicine

## 2024-07-17 DIAGNOSIS — M25552 Pain in left hip: Secondary | ICD-10-CM

## 2024-07-22 ENCOUNTER — Other Ambulatory Visit: Payer: Self-pay | Admitting: Physician Assistant

## 2024-07-23 ENCOUNTER — Encounter (HOSPITAL_BASED_OUTPATIENT_CLINIC_OR_DEPARTMENT_OTHER): Admitting: General Surgery

## 2024-07-24 ENCOUNTER — Ambulatory Visit (HOSPITAL_COMMUNITY)
Admission: RE | Admit: 2024-07-24 | Discharge: 2024-07-24 | Disposition: A | Source: Ambulatory Visit | Attending: Emergency Medicine | Admitting: Emergency Medicine

## 2024-07-24 DIAGNOSIS — M25552 Pain in left hip: Secondary | ICD-10-CM

## 2024-07-30 ENCOUNTER — Encounter (HOSPITAL_BASED_OUTPATIENT_CLINIC_OR_DEPARTMENT_OTHER): Admitting: General Surgery

## 2024-07-31 ENCOUNTER — Ambulatory Visit: Admitting: Family Medicine

## 2024-08-15 ENCOUNTER — Ambulatory Visit: Admitting: Pulmonary Disease

## 2024-08-15 ENCOUNTER — Encounter (HOSPITAL_BASED_OUTPATIENT_CLINIC_OR_DEPARTMENT_OTHER)
